# Patient Record
Sex: Male | Born: 1985 | Race: White | Hispanic: No | Marital: Single | Smoking: Former smoker
Health system: Southern US, Community
[De-identification: ages and names within clinical notes are randomized; demographics above are authoritative.]

## PROBLEM LIST (undated history)

## (undated) DIAGNOSIS — E785 Hyperlipidemia, unspecified: Secondary | ICD-10-CM

## (undated) DIAGNOSIS — I509 Heart failure, unspecified: Secondary | ICD-10-CM

## (undated) DIAGNOSIS — I255 Ischemic cardiomyopathy: Secondary | ICD-10-CM

## (undated) DIAGNOSIS — R0789 Other chest pain: Secondary | ICD-10-CM

## (undated) DIAGNOSIS — Z8659 Personal history of other mental and behavioral disorders: Secondary | ICD-10-CM

## (undated) DIAGNOSIS — T1491XA Suicide attempt, initial encounter: Secondary | ICD-10-CM

## (undated) DIAGNOSIS — I2510 Atherosclerotic heart disease of native coronary artery without angina pectoris: Secondary | ICD-10-CM

## (undated) DIAGNOSIS — Z87891 Personal history of nicotine dependence: Secondary | ICD-10-CM

## (undated) DIAGNOSIS — I4891 Unspecified atrial fibrillation: Secondary | ICD-10-CM

## (undated) DIAGNOSIS — G4700 Insomnia, unspecified: Secondary | ICD-10-CM

## (undated) DIAGNOSIS — I214 Non-ST elevation (NSTEMI) myocardial infarction: Secondary | ICD-10-CM

## (undated) HISTORY — PX: TYMPANOSTOMY TUBE PLACEMENT: SHX32

## (undated) HISTORY — DX: Hyperlipidemia, unspecified: E78.5

## (undated) HISTORY — PX: CORONARY STENT PLACEMENT: SHX1402

## (undated) HISTORY — PX: COLONOSCOPY: SHX174

## (undated) HISTORY — DX: Other chest pain: R07.89

## (undated) HISTORY — DX: Suicide attempt, initial encounter: T14.91XA

## (undated) HISTORY — DX: Atherosclerotic heart disease of native coronary artery without angina pectoris: I25.10

## (undated) HISTORY — DX: Personal history of nicotine dependence: Z87.891

## (undated) HISTORY — DX: Personal history of other mental and behavioral disorders: Z86.59

## (undated) HISTORY — DX: Insomnia, unspecified: G47.00

## (undated) HISTORY — DX: Heart failure, unspecified: I50.9

## (undated) HISTORY — DX: Non-ST elevation (NSTEMI) myocardial infarction: I21.4

## (undated) HISTORY — DX: Unspecified atrial fibrillation: I48.91

## (undated) HISTORY — DX: Ischemic cardiomyopathy: I25.5

## (undated) HISTORY — PX: ADENOIDECTOMY: SUR15

## (undated) HISTORY — PX: UPPER GASTROINTESTINAL ENDOSCOPY: SHX188

---
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
## 2007-08-01 DIAGNOSIS — I214 Non-ST elevation (NSTEMI) myocardial infarction: Secondary | ICD-10-CM

## 2007-08-01 HISTORY — DX: Non-ST elevation (NSTEMI) myocardial infarction: I21.4

## 2007-08-30 ENCOUNTER — Inpatient Hospital Stay (HOSPITAL_COMMUNITY): Admission: EM | Admit: 2007-08-30 | Discharge: 2007-09-01 | Payer: Self-pay | Admitting: Cardiovascular Disease

## 2007-08-30 ENCOUNTER — Ambulatory Visit: Payer: Self-pay | Admitting: Cardiovascular Disease

## 2007-08-30 ENCOUNTER — Ambulatory Visit: Payer: Self-pay | Admitting: Cardiology

## 2007-08-31 ENCOUNTER — Encounter: Payer: Self-pay | Admitting: Cardiology

## 2007-09-23 ENCOUNTER — Encounter: Payer: Self-pay | Admitting: Physician Assistant

## 2007-09-23 ENCOUNTER — Ambulatory Visit: Payer: Self-pay | Admitting: Cardiology

## 2007-09-26 ENCOUNTER — Encounter: Payer: Self-pay | Admitting: Physician Assistant

## 2007-12-27 ENCOUNTER — Ambulatory Visit: Payer: Self-pay | Admitting: Cardiology

## 2007-12-28 ENCOUNTER — Ambulatory Visit: Payer: Self-pay | Admitting: Cardiology

## 2009-02-26 ENCOUNTER — Encounter: Payer: Self-pay | Admitting: Cardiology

## 2010-07-15 NOTE — Assessment & Plan Note (Signed)
[facility]CARE                          [facility] OFF[facility] NOTE   NAME:Wyatt Donovan, Wyatt Donovan                     MRN:          [id number]  DATE:09/23/2007                            DOB:          Mar 10, 1985    CARDIOLOGIST:  Learta Codding, MD,FACC.   PRIMARY CARE PHYSICIAN:  None.   REASON FOR VISIT:  Post-hospitalization followup.   HISTORY OF PRESENT ILLNESS:  Wyatt Donovan is a 25 year old male patient  with essentially negative past medical history who presented to [facility] on August 30, 2007 with complaints of chest pain.  He was noted  to have biphasic T waves and some ST elevation in anterior precordial  leads.  His troponin went up to 1.92.  An echocardiogram revealed an EF  of 40-45% with anterior and anteroapical akinesis.  He was transferred  to [facility] for further evaluation and treatment.   The patient was taken emergently to the cardiac catheterization lab and  seen by Dr. Tonny Bollman.  The patient's heart catheterization  demonstrated 95% proximal lesion in the LAD.  This was treated with a  bare-metal stent.  His post PCI course was fairly uneventful.  He was  placed on beta-blocker, ACE inhibitor, statin therapy, aspirin, and  Plavix.  He returns to the office today for followup.   The patient notes 1 episode of chest pain several days ago while at  rest.  This was somewhat similar to his pain with myocardial infarction,  but nowhere near as intense.  He took a nitroglycerin and the pain  subsided in about 15-20 minutes.  He did note some associated shortness  of breath, nausea, and diaphoresis.  He has been walking as much as  possible.  It is up to 12 minutes twice a day.  He denies any exertional  chest discomfort or shortness of breath.  He denies orthopnea, PND, or  pedal edema.  He denies any syncope.  He denies any pleuritic chest  pain.  He is having great difficulty with sleeping.  He also feels quite  weak and tired.   MEDICATIONS:  1. Aspirin 325 mg daily.  2. Plavix 75 mg daily.  3. Pravastatin 40 mg nightly.  4. Coreg 3.125 mg b.i.d.  5. Lisinopril 2.5 mg daily.  6. Nitroglycerin p.r.n. chest pain.   ALLERGIES:  CODEINE causes nausea.   SOCIAL HISTORY:  He recently quit smoking.   PHYSICAL EXAMINATION:  GENERAL:  He is a well-nourished and well-  developed male.  VITAL SIGNS:  Blood pressure is 110/66, pulse 78, and weight 201.4  pounds.  HEENT:  Normal.  NECK:  Without JVD.  LYMPH:  No lymphadenopathy.  CARDIAC:  Normal S1 and S2.  Regular rate and rhythm.  No murmur.  No  rubs.  No gallops.  LUNGS:  Clear to auscultation bilaterally.  No wheezes.  No rhonchi.  No  rales.  ABDOMEN:  Soft and nontender with normoactive bowel sounds.  No  organomegaly.  EXTREMITIES:  Without edema.  NEUROLOGIC:  He is alert and oriented x3.  Cranial nerves II-XII grossly  intact.  Right femoral arteriotomy site without hematoma or bruit.   Electrocardiogram reveals sinus rhythm with a heart rate of 71, first-  degree AV block with the PR interval of 240 msec, normal axis,  interventricular conduction delay, and J-point elevation in V2 through  V6.   IMPRESSION:  1. Coronary artery disease.      a.     Status post non-ST-elevation myocardial infarction in June       2009 treated with a bare-metal stent to the proximal left anterior       descending.      b.     No significant disease found in the left main, circumflex or       right coronary artery at time of catheterization.  2. Ischemic cardiomyopathy with an ejection fraction of 40-45%.  3. Chest pain.  4. Insomnia.  5. Dyslipidemia.  6. History of depression.  7. Ex-smoker.   PLAN:  1. Mr. Slates returns to the office today for followup.  Overall, he      is stable from a cardiovascular standpoint.  He has had problems      with insomnia as well as a single episode of chest pain.  His EKG      is somewhat abnormal, but  it appears that this is likely all J-      point elevation.  He is not currently having any chest pain.  He      did have 1 episode of chest discomfort, but this resolved with      nitroglycerin.  According to the cardiac catheterization, he has no      evidence of other residual obstructive disease.  2. The patient's orthostatic vital signs were checked today.  His      blood pressure lying was 118/72 with pulse of 69, sitting 121/76      with a pulse of 75, and standing 122/81 with a pulse of 83.  After      2 minutes, 129/82 with a pulse of 79 and after 5 minutes 113/72      with a pulse of 78.  Therefore, the patient is not orthostatic.      His heart rate does increase a little bit when he stands.  I have      asked him to be certain he is drinking plenty of water to stay      hydrated.  3. No adjustments in his present medications will be made today.  4. We will add ranitidine 150 mg b.i.d to cover for gastrointestinal      symptoms causing some of his chest pain.  He is currently on      several medications that may be causing some dyspepsia.  5. I have prescribed Restoril 15 mg one-half to one tablet nightly      p.r.n. to better help him with sleep.  I suspect a great deal of      his symptoms are related to insomnia.  6. He will need followup lipids in a couple of months.  7. He will need to followup with an echocardiogram in the beginning of      October.  8. I discussed the patient's case with Dr. Diona Browner.  He agreed with      the above assessment and plan.  The patient knows to go to the      emergency room if he has recurrent symptoms that do not resolve      with nitroglycerin.  He will  follow up in 4 weeks or sooner p.r.n.      At some point, we may need to consider followup stress testing, if      he has recurrent symptoms.       Tereso Newcomer, PA-C  Electronically Signed      Jonelle Sidle, MD  Electronically Signed   SW/MedQ  DD: 09/23/2007  DT:  09/24/2007  Job #: [id number]

## 2010-07-15 NOTE — Discharge Summary (Signed)
Wyatt Donovan, Wyatt Donovan              ACCOUNT NO.:  [id number]   MEDICAL RECORD NO.:  [id number]          PATIENT TYPE:  OIB   LOCATION:  4736                         FACILITY:  [facility]   PHYSICIAN:  Veverly Fells. Excell Seltzer, MD  DATE OF BIRTH:  February 10, 1986   DATE OF ADMISSION:  08/30/2007  DATE OF DISCHARGE:  08/31/2007                               DISCHARGE SUMMARY   PRIMARY CARDIOLOGIST:  Dr. Lewayne Bunting.   DISCHARGE DIAGNOSIS:  Acute non-ST segment elevation myocardial  infarction.   SECONDARY DIAGNOSES:  1. Coronary artery disease status post successful PCI stenting of the      proximal left anterior descending with placement of a 3.5 x 18 mm      Vision  bare metal stent.  2. Hyperlipidemia.  3. Tobacco abuse.  4. Ischemic cardiopathy EF 40-45% by 2-D echocardiogram performed at      [facility].  Apical akinesis noted on left ventriculography.  5. History of depression with previous suicide attempt in December      2002.   ALLERGIES:  CODEINE.   PROCEDURES:  Left cardiac catheterization with successful PCI and  stenting of the proximal LAD as outlined above.   HISTORY OF PRESENT ILLNESS:  A 25 year old Caucasian male without prior  cardiac history.  He does have a family history of CAD with his mother  experiencing CAD and PVD starting at age 69.  He was in his usual state  of health until approximately 3 days prior to admission when began to  experience what he felt was indigestion that was occurring after meals.  On the morning of August 30, 2007, he awoke with more stabbing-like pain  in the center of his chest, and his father took him to the [facility] emergency room.  There, he was noted to have elevated troponin  which was initially 1.3 with a CK 271 and MB of 12.2.  He subsequently  bumped further and cardiology was consulted.  His ECG also showed  anterior J-point elevation with coving of the ST-segment.  2-D  echocardiogram was performed at [facility]  showing an EF of 40-  45% with multiple regional wall motion abnormalities.  The decision was  made to transfer to [facility] for further evaluation and management of  non-ST segment elevation MI.   HOSPITAL COURSE:  Upon arrival, the patient was experiencing chest  discomfort and was taken to the cath lab urgently.  Left heart cardiac  catheterization was performed revealing a 95% stenosis at the proximal  LAD and otherwise nonobstructive disease.  EF was 45% with apical  akinesis.  The LAD was successfully stented with a 3.5 x 18 mm Vision  bare metal stent.  The patient tolerated this procedure well and  postprocedure, he peaked his CK at 472, MB of 35.6, troponin I at 6.04.  He has been initiated on aspirin, Plavix, Statin, beta blocker, ACE  inhibitor therapy and has not had any recurrent chest discomfort.  He  has been seen by the cardiac rehab team and has also been counseled  extensively on the importance of smoking cessation  as well as medication  compliance.  As he does have some financial hardship, we have tried to  keep his medications generic wherever possible and I have also  arrangements for him to receive a 14-day Plavix card, as well as  enrollment in the [employer] Plavix assistance plan.  Wyatt Donovan will follow up with Dr. Andee Lineman in approximately 3 weeks at  which point he will require a basic metabolic panel.  He will require  lipids and LFTs in approximately 8 weeks and repeat echo in about 3  months.  Wyatt Donovan is being discharged home today in good condition.   DISCHARGE LABS:  Hemoglobin 15.9, hematocrit 45.5, WBC 9.3, platelets  184,000.  Sodium 44, potassium 4.6, chloride 105, CO2 32, BUN 5,  creatinine 0.92, glucose 85, total bilirubin 0.9, alkaline phosphatase  53, AST 57, ALT 37, total protein 6.4, albumin 3.5, calcium 9, CK 472,  MB 35.6, troponin I 6.04, total cholesterol 178, triglycerides 255, HDL  220, LDL 107.  TSH 1.939.  Homocysteine  10.   DISPOSITION:  The patient is being discharged home today in good  condition.   FOLLOWUP PLANS AND APPOINTMENTS:  We have arranged for follow up with  Dr. Andee Lineman on July 24 at 1:45 p.m.  As we are initiating ACE inhibitor  at this point, he should have repeat BMET.  Follow up with lipids and  LFTs in 8 weeks and an echo in 3 months   DISCHARGE MEDICATIONS:  1. Aspirin 325 mg daily.  2. Plavix 75 mg daily.  3. Pravastatin 40 mg nightly.  4. Coreg 3.125 mg b.i.d.  5. Lisinopril 2.5 mg daily.  6. Nitroglycerin 0.4 mg sublingual p.r.n. chest pain.   OUTSTANDING LABS AND STUDIES:  None.   DURATION OF DISCHARGE ENCOUNTER:  60 minutes including physician time.      Nicolasa Ducking, ANP      Veverly Fells. Excell Seltzer, MD  Electronically Signed    CB/MEDQ  D:  09/01/2007  T:  09/01/2007  Job:  [id number]   cc:   Learta Codding, MD,FACC

## 2010-07-15 NOTE — Assessment & Plan Note (Signed)
[facility]CARE                          [facility] NOTE   NAME:Wyatt Donovan, Wyatt Donovan                     MRN:          [id number]  DATE:12/28/2007                            DOB:          [birth year]    HISTORY OF PRESENT ILLNESS:  The patient is a pleasant 25 year old male  with a recent non-ST-elevation myocardial infarction in June 2009,  treated with a bare-metal stent to the proximal LAD.  Ejection fraction  at that time was 40-45%.  The patient had a followup echocardiographic  study done on August 30, 2007, with an ejection fraction of 40-45%.  His  most recent echocardiogram, however, was dated December 27, 2007, and  demonstrates normal left ventricular function, mild hypertrophy, and  ejection fraction of 55-60%.  The patient is doing well.  He reports no  shortness of breath.  He has occasional atypical chest pains, but they  are in the setting of what appears to be fairly panic attacks and a  generalized anxiety disorder.  The patient stated he wakes up sometimes  in the middle of the night rather clammy, sweaty, and ruminating about  his heart attack.  He is always afraid that this might reoccur.  He does  not show any signs of depression, although he has generalized difficulty  with sleeping.  Dr. Tereso Newcomer had prescribed Ambien, but the patient  states that this did not help make him sleep.   PHYSICAL EXAMINATION:  VITAL SIGNS:  Blood pressure 102/74, heart rate  66, weighs 197 pounds.  NECK:  Normal carotid upstroke and no carotid bruits.  LUNGS:  Clear breath sounds bilaterally.  HEART:  Regular rate and rhythm, normal S1 and S2.  No pathological  murmurs.  ABDOMEN:  Soft, nontender, and no rebound.  EXTREMITIES:  No cyanosis, clubbing, or edema.   PROBLEM LIST:  1. Coronary artery disease status post non-ST-elevation myocardial      infarction in June 2009, treated with a bare-metal stent to the      proximal left anterior  descending.  2. Ischemic cardiomyopathy with improved ejection fraction to 55-60%.  3. Atypical chest pain.  4. Insomnia.  5. History of depression with previous suicide attempt in December      2002.  6. Ex-smoker.  7. Dyslipidemia.  8. Generalized anxiety state.   PLAN:  1. The patient is doing well from a cardiovascular perspective.  He      has used nitroglycerin on one occasion but did appear that was in      the setting of anxiety when he had atypical chest pain.  He really      reports no angina.  2. The patient does report some dizziness after he takes lisinopril      and we have discontinued this medication particularly in light of      his normal left ventricular function.  3. The most important thing for this patient to take care of his is      generalized anxiety state, which is associated with insomnia,      ruminating thoughts, and early panic attacks.  He states that he      took Zoloft in the past but did not really like to have nausea      associated with it.  We talked about the possibility of giving him      citalopram but eventually decided to stay away from the SSRI drugs      and try him on buspirone at 7.5 mg p.o. b.i.d. with up-titration of      the dose as needed.  I also gave him clonazepam 0.5 mg p.o. b.i.d.      to help in the first 4 weeks with his generalized anxiety state and      this can then be tapered off at week 4-6 and leave him on buspirone      alone.  It may still well be that he may need a small dose of      clonazepam longer for his insomnia.     Learta Codding, MD,FACC  Electronically Signed    GED/MedQ  DD: 12/28/2007  DT: 12/28/2007  Job #: [id number]

## 2010-07-18 NOTE — H&P (Signed)
[facility]  Patient:    Wyatt Donovan, Wyatt Donovan Visit Number: [id number] MRN: [id number]          Service Type: PSY Location: 200 0201 01 Attending Physician:  Veneta Penton. Dictated by:   Veneta Penton, M.D. Admit Date:  02/17/2001                     Psychiatric Admission Assessment  REASON FOR ADMISSION:  This 25 year old white male was admitted complaining of depression status post overdose as a suicide attempt.  HISTORY OF PRESENT ILLNESS:  The patient complains of increasingly depressed, irritable and angry mood most of the day, nearly every day, with increasing anxiety, anhedonia, decreased school performance, decreased concentration and energy level, increased symptoms of fatigue, insomnia, weight gain, psychomotor agitation, feelings of hopelessness, helplessness, worthlessness. He is unable to contract for safety at this time.  He reports several psychosocial stressors.  His father has lost his job over the past several months because of worsening of the fathers emphysema.  The fathers doctor has told him the father could not return to work.  This has caused significant financial stressors in the home.  Added to this has been the fact that mother was injured on the job and no longer has use of her hand and is presently applying for [employer].  PAST PSYCHIATRIC HISTORY:  History of attention-deficit hyperactivity disorder.  He denies any other history of psychiatric illness.  ALCOHOL/DRUG HISTORY:  Uses cannabis on a daily basis for the past several years.  He states that "Im trying to quit" and that he had stopped using all cannabis four days ago.  He reports smoking one pack of cigarettes per day for the past several years.  He denies any other street drug use.  ALLERGIES:  He has no known drug allergies or sensitivities.  PAST MEDICAL HISTORY:  Obesity.  He denies any other medical or surgical problems.  CURRENT  MEDICATIONS:  Ritalin 5 mg p.o. b.i.d., Ambien 10 mg p.o. q.h.s., which are prescribed by Dr. Wynonia Lawman, his outpatient psychiatrist.  He reports that he has had no help in the past from trials of Paxil CR and Zoloft and that both of these drugs caused weight gain.  STRENGTHS AND ASSETS:  His parents are very supportive of him.  FAMILY/SOCIAL HISTORY:  The patient lives with his mother and father.  He is currently in the 11th grade and will be promoted to 12th grade with the next semester.  MENTAL STATUS EXAMINATION:  The patient presents as a disheveled, unkempt, well-developed, well-nourished, obese adolescent white male, who is alert, oriented x 4.  Cooperative with the evaluation and appearance is compatible with his stated age.  Speech is coherent with a decreased rate and volume of speech, increased speech latency.  He displays no looseness of associations, phonemic errors or evidence of a thought disorder.  His affect and mood are depressed and irritable and anxious.  His concentration is decreased as is his attention span.  He is easily distracted by extraneous stimuli.  His immediate recall, short-term memory and remote memory are intact.  Similarities and differences are within normal limits and he is able to abstract to simple proverbs.  His thought processes are generally goal directed.  DIAGNOSES:  (According to DSM-IV). Axis I:    1. Major depression, single episode, severe without psychosis.            2. Attention-deficit hyperactivity disorder, combined-type.  3. Cannabis dependence.            4. Nicotine dependence. Axis II:   1. Rule out personality disorder not otherwise specified.            2. Rule out learning disorder not otherwise specified. Axis III:  Obesity. Axis IV:   Severe. Axis V:    20.  ESTIMATED LENGTH OF STAY:  Five to seven days.  INITIAL DISCHARGE PLAN:  Discharge the patient to home.  INITIAL PLAN OF CARE:  Begin the patient on a  trial of Effexor XR once informed consent is obtained and the risks/benefits discussion has been held. Psychotherapy will focus on improving the patients impulse control, decreasing cognitive distortions, decreasing potential for harm to self and others.  A laboratory workup will also be initiated to rule out any other medical problems contributing to his symptomatology.Dictated by:   Veneta Penton, M.D. Attending Physician:  Veneta Penton DD:  02/18/01 TD:  02/20/01 Job: [id number]

## 2010-07-18 NOTE — Discharge Summary (Signed)
[facility]  Patient:    Wyatt Donovan, Wyatt Donovan Visit Number: [id number] MRN: [id number]          Service Type: PSY Location: 200 0201 01 Attending Physician:  Veneta Penton. Dictated by:   Carolanne Grumbling, M.D. Admit Date:  02/17/2001 Discharge Date: 02/22/2001                             Discharge Summary  AGE/SEX:  The patient is a 25 year old male.  INITIAL ASSESSMENT AND DIAGNOSIS:  The patient was admitted to the service of Dr. Haynes Hoehn and I was on call at the time of discharge.  He was admitted after complaints of depression and having taken an overdose of six Xanax tablets in an apparent suicidal attempt.  He admitted to most of the characteristics of depression, including irritability, angry mood, loss of interest, poor school performance, trouble with concentration, symptoms of fatigue, sleeplessness, weight gain, hopelessness, helplessness, wrecklessness.  At the time of admission he was able to contract for safety. His stressors were that his father had lost his job because of emphysema and was told that he was not going to be able to return to work, this has caused financial stressors at home, his mother has been injured on her job and was no longer able to work and was applying for [employer].  He had a history of attention-deficit disorder.  Other pertinent history can be obtained from the psychosocial service summary.  PHYSICAL EXAMINATION:  Physical examination was within normal limits except for being overweight.  ADMITTING DIAGNOSES: Axis I:     1. Major depression, single episode, severe, without psychosis.             2. Attention-deficit hyperactivity disorder combined.             3. Cannabis dependence.             4. Nicotine dependence. Axis II:    Rule out personality disorder and learning disorder. Axis III:   Overweight. Axis IV:    Severe. Axis V:     20.  FINDINGS:  All indicated laboratory  examinations were within normal limits or noncontributory.  HOSPITAL COURSE:  While in the hospital, the patient was essentially no behavioral problem, he from the day he came in admitted that he was wrong in doing what he had done, he talked fairly openly about his relationship with his father and his mother, he was particularly his fathers loss of a job and his fathers emphysema, he and his dad had always been close.  Nevertheless, he said he understood that his job was to do what he is supposed to do and not add further burdens to the family, he said there is no reason why he could not continue loving his father and accepting him as he was because he knew his father had to do the same for himself.  After a family session with his parents, which went well, he was discharged based on the fact that he had consistently denied any suicidal thoughts and made no threats while he was in the hospital.  DIAGNOSES AT THE TIME OF DISCHARGE: Axis I:     1. Depressive disorder, not otherwise specified.             2. Attention-deficit hyperactivity disorder combined.             3. Cannabis abuse.  4. Nicotine dependence. Axis II:    Deferred. Axis III:   Overweight. Axis IV:    Severe. Axis V:     55.  POSTHOSPITAL CARE PLAN:  He is referred to outpatient treatment with Dr. Milford Cage at [facility] in [place] and the appointment will be made on Friday after Christmas as he was discharged on Christmas Eve.  At the time of discharge he was taking Concerta 36 mg daily, Effexor XR 75 mg daily, clonidine 0.1 mg at bedtime.  There were no restrictions placed on his activity or his diet. Dictated by:   Carolanne Grumbling, M.D. Attending Physician:  Veneta Penton DD:  03/08/01 TD:  03/08/01 Job: [id number]

## 2010-08-28 ENCOUNTER — Encounter: Payer: Self-pay | Admitting: Cardiology

## 2010-11-27 LAB — CBC
HCT: 45.5
Hemoglobin: 15.9
MCV: 93.3
Platelets: 184
RDW: 13.6

## 2010-11-27 LAB — BASIC METABOLIC PANEL
BUN: 5 — ABNORMAL LOW
CO2: 32
Chloride: 105
GFR calc non Af Amer: >60
Glucose, Bld: 85
Potassium: 4.6
Sodium: 144

## 2010-11-27 LAB — TROPONIN I: Troponin I: 6.04

## 2010-11-27 LAB — HEPATIC FUNCTION PANEL
AST: 57 — ABNORMAL HIGH
Bilirubin, Direct: 0.1
Indirect Bilirubin: 0.8

## 2010-11-27 LAB — LIPID PANEL
Triglycerides: 255 — ABNORMAL HIGH
VLDL: 51 — ABNORMAL HIGH

## 2010-11-27 LAB — TSH: TSH: 1.939 (ref 0.350–4.500)

## 2010-11-27 LAB — CK TOTAL AND CKMB (NOT AT ARMC): Relative Index: 7.5 — ABNORMAL HIGH

## 2010-11-27 LAB — HOMOCYSTEINE: Homocysteine: 10

## 2020-04-10 ENCOUNTER — Encounter: Payer: Self-pay | Admitting: Internal Medicine

## 2020-05-09 ENCOUNTER — Ambulatory Visit: Payer: Self-pay | Admitting: Internal Medicine

## 2020-06-29 ENCOUNTER — Emergency Department (HOSPITAL_COMMUNITY): Payer: Medicaid Other

## 2020-06-29 ENCOUNTER — Inpatient Hospital Stay (HOSPITAL_COMMUNITY): Payer: Medicaid Other

## 2020-06-29 ENCOUNTER — Encounter (HOSPITAL_COMMUNITY): Payer: Self-pay | Admitting: Internal Medicine

## 2020-06-29 ENCOUNTER — Inpatient Hospital Stay (HOSPITAL_COMMUNITY)
Admission: EM | Admit: 2020-06-29 | Discharge: 2020-07-10 | DRG: 233 | Disposition: A | Discharge status: Home / Self Care | Payer: Medicaid Other | Attending: Thoracic Surgery (Cardiothoracic Vascular Surgery) | Admitting: Thoracic Surgery (Cardiothoracic Vascular Surgery)

## 2020-06-29 ENCOUNTER — Other Ambulatory Visit: Payer: Self-pay

## 2020-06-29 DIAGNOSIS — Z8679 Personal history of other diseases of the circulatory system: Secondary | ICD-10-CM

## 2020-06-29 DIAGNOSIS — I214 Non-ST elevation (NSTEMI) myocardial infarction: Secondary | ICD-10-CM

## 2020-06-29 DIAGNOSIS — Z955 Presence of coronary angioplasty implant and graft: Secondary | ICD-10-CM

## 2020-06-29 DIAGNOSIS — R079 Chest pain, unspecified: Secondary | ICD-10-CM | POA: Diagnosis present

## 2020-06-29 DIAGNOSIS — D62 Acute posthemorrhagic anemia: Secondary | ICD-10-CM | POA: Diagnosis not present

## 2020-06-29 DIAGNOSIS — I2510 Atherosclerotic heart disease of native coronary artery without angina pectoris: Secondary | ICD-10-CM | POA: Diagnosis present

## 2020-06-29 DIAGNOSIS — K921 Melena: Secondary | ICD-10-CM | POA: Diagnosis not present

## 2020-06-29 DIAGNOSIS — Z79899 Other long term (current) drug therapy: Secondary | ICD-10-CM | POA: Diagnosis not present

## 2020-06-29 DIAGNOSIS — J90 Pleural effusion, not elsewhere classified: Secondary | ICD-10-CM

## 2020-06-29 DIAGNOSIS — I2111 ST elevation (STEMI) myocardial infarction involving right coronary artery: Principal | ICD-10-CM | POA: Diagnosis present

## 2020-06-29 DIAGNOSIS — Z7902 Long term (current) use of antithrombotics/antiplatelets: Secondary | ICD-10-CM

## 2020-06-29 DIAGNOSIS — F17210 Nicotine dependence, cigarettes, uncomplicated: Secondary | ICD-10-CM | POA: Diagnosis present

## 2020-06-29 DIAGNOSIS — I25110 Atherosclerotic heart disease of native coronary artery with unstable angina pectoris: Secondary | ICD-10-CM | POA: Diagnosis present

## 2020-06-29 DIAGNOSIS — I255 Ischemic cardiomyopathy: Secondary | ICD-10-CM | POA: Diagnosis present

## 2020-06-29 DIAGNOSIS — I5043 Acute on chronic combined systolic (congestive) and diastolic (congestive) heart failure: Secondary | ICD-10-CM | POA: Diagnosis not present

## 2020-06-29 DIAGNOSIS — I252 Old myocardial infarction: Secondary | ICD-10-CM

## 2020-06-29 DIAGNOSIS — I513 Intracardiac thrombosis, not elsewhere classified: Secondary | ICD-10-CM | POA: Diagnosis present

## 2020-06-29 DIAGNOSIS — I509 Heart failure, unspecified: Secondary | ICD-10-CM

## 2020-06-29 DIAGNOSIS — J9811 Atelectasis: Secondary | ICD-10-CM | POA: Diagnosis not present

## 2020-06-29 DIAGNOSIS — Z8249 Family history of ischemic heart disease and other diseases of the circulatory system: Secondary | ICD-10-CM

## 2020-06-29 DIAGNOSIS — E785 Hyperlipidemia, unspecified: Secondary | ICD-10-CM | POA: Diagnosis present

## 2020-06-29 DIAGNOSIS — Z885 Allergy status to narcotic agent status: Secondary | ICD-10-CM | POA: Diagnosis not present

## 2020-06-29 DIAGNOSIS — Z20822 Contact with and (suspected) exposure to covid-19: Secondary | ICD-10-CM | POA: Diagnosis present

## 2020-06-29 DIAGNOSIS — J939 Pneumothorax, unspecified: Secondary | ICD-10-CM

## 2020-06-29 DIAGNOSIS — Z951 Presence of aortocoronary bypass graft: Secondary | ICD-10-CM

## 2020-06-29 LAB — CBC WITH DIFFERENTIAL/PLATELET
Abs Immature Granulocytes: 0.04 10*3/uL (ref 0.00–0.07)
Basophils Absolute: 0.1 10*3/uL (ref 0.0–0.1)
Basophils Relative: 1 %
Eosinophils Absolute: 0.4 10*3/uL (ref 0.0–0.5)
Eosinophils Relative: 3 %
HCT: 53.5 % — ABNORMAL HIGH (ref 39.0–52.0)
Hemoglobin: 17.1 g/dL — ABNORMAL HIGH (ref 13.0–17.0)
Immature Granulocytes: 0 %
Lymphocytes Relative: 34 %
Lymphs Abs: 3.9 10*3/uL (ref 0.7–4.0)
MCH: 31 pg (ref 26.0–34.0)
MCHC: 32 g/dL (ref 30.0–36.0)
MCV: 96.9 fL (ref 80.0–100.0)
Monocytes Absolute: 0.8 10*3/uL (ref 0.1–1.0)
Monocytes Relative: 7 %
Neutro Abs: 6.2 10*3/uL (ref 1.7–7.7)
Neutrophils Relative %: 55 %
Platelets: 207 10*3/uL (ref 150–400)
RBC: 5.52 MIL/uL (ref 4.22–5.81)
RDW: 13.4 % (ref 11.5–15.5)
WBC: 11.4 10*3/uL — ABNORMAL HIGH (ref 4.0–10.5)
nRBC: 0 % (ref 0.0–0.2)

## 2020-06-29 LAB — COMPREHENSIVE METABOLIC PANEL
ALT: 44 U/L (ref 0–44)
AST: 36 U/L (ref 15–41)
Albumin: 3.6 g/dL (ref 3.5–5.0)
Alkaline Phosphatase: 69 U/L (ref 38–126)
Anion gap: 11 (ref 5–15)
BUN: 15 mg/dL (ref 6–20)
CO2: 24 mmol/L (ref 22–32)
Calcium: 9.1 mg/dL (ref 8.9–10.3)
Chloride: 109 mmol/L (ref 98–111)
Creatinine, Ser: 0.96 mg/dL (ref 0.61–1.24)
GFR, Estimated: >60 mL/min (ref >60)
Glucose, Bld: 103 mg/dL — ABNORMAL HIGH (ref 70–99)
Potassium: 4 mmol/L (ref 3.5–5.1)
Sodium: 144 mmol/L (ref 135–145)
Total Bilirubin: 0.7 mg/dL (ref 0.3–1.2)
Total Protein: 6.4 g/dL — ABNORMAL LOW (ref 6.5–8.1)

## 2020-06-29 LAB — TROPONIN I (HIGH SENSITIVITY)
Troponin I (High Sensitivity): 74 ng/L — ABNORMAL HIGH (ref <18)
Troponin I (High Sensitivity): 366 ng/L (ref <18)
Troponin I (High Sensitivity): 25186 ng/L (ref <18)

## 2020-06-29 LAB — RESP PANEL BY RT-PCR (FLU A&B, COVID) ARPGX2
Influenza A by PCR: NEGATIVE
Influenza B by PCR: NEGATIVE
SARS Coronavirus 2 by RT PCR: NEGATIVE

## 2020-06-29 LAB — HEPARIN LEVEL (UNFRACTIONATED)
Heparin Unfractionated: 0.32 IU/mL (ref 0.30–0.70)
Heparin Unfractionated: 0.37 IU/mL (ref 0.30–0.70)

## 2020-06-29 LAB — PROTIME-INR
INR: 1 (ref 0.8–1.2)
Prothrombin Time: 13.3 seconds (ref 11.4–15.2)

## 2020-06-29 LAB — BRAIN NATRIURETIC PEPTIDE: B Natriuretic Peptide: 129.9 pg/mL — ABNORMAL HIGH (ref 0.0–100.0)

## 2020-06-29 LAB — LIPID PANEL
Cholesterol: 125 mg/dL (ref 0–200)
HDL: 27 mg/dL — ABNORMAL LOW (ref >40)
LDL Cholesterol: 83 mg/dL (ref 0–99)
Total CHOL/HDL Ratio: 4.6 RATIO
Triglycerides: 75 mg/dL (ref <150)
VLDL: 15 mg/dL (ref 0–40)

## 2020-06-29 LAB — T4, FREE: Free T4: 1.18 ng/dL — ABNORMAL HIGH (ref 0.61–1.12)

## 2020-06-29 LAB — MRSA PCR SCREENING: MRSA by PCR: NEGATIVE

## 2020-06-29 LAB — TSH: TSH: 0.978 u[IU]/mL (ref 0.350–4.500)

## 2020-06-29 LAB — HIV ANTIBODY (ROUTINE TESTING W REFLEX): HIV Screen 4th Generation wRfx: NONREACTIVE

## 2020-06-29 IMAGING — DX DG CHEST 1V PORT
2 series · 2 of 2 positions shown · non-contrast
Comparison: Prior radiograph from [DATE].

CLINICAL DATA: Initial evaluation for acute chest pain.

EXAM:
PORTABLE CHEST 1 VIEW

[chest ap (1 of 2)]
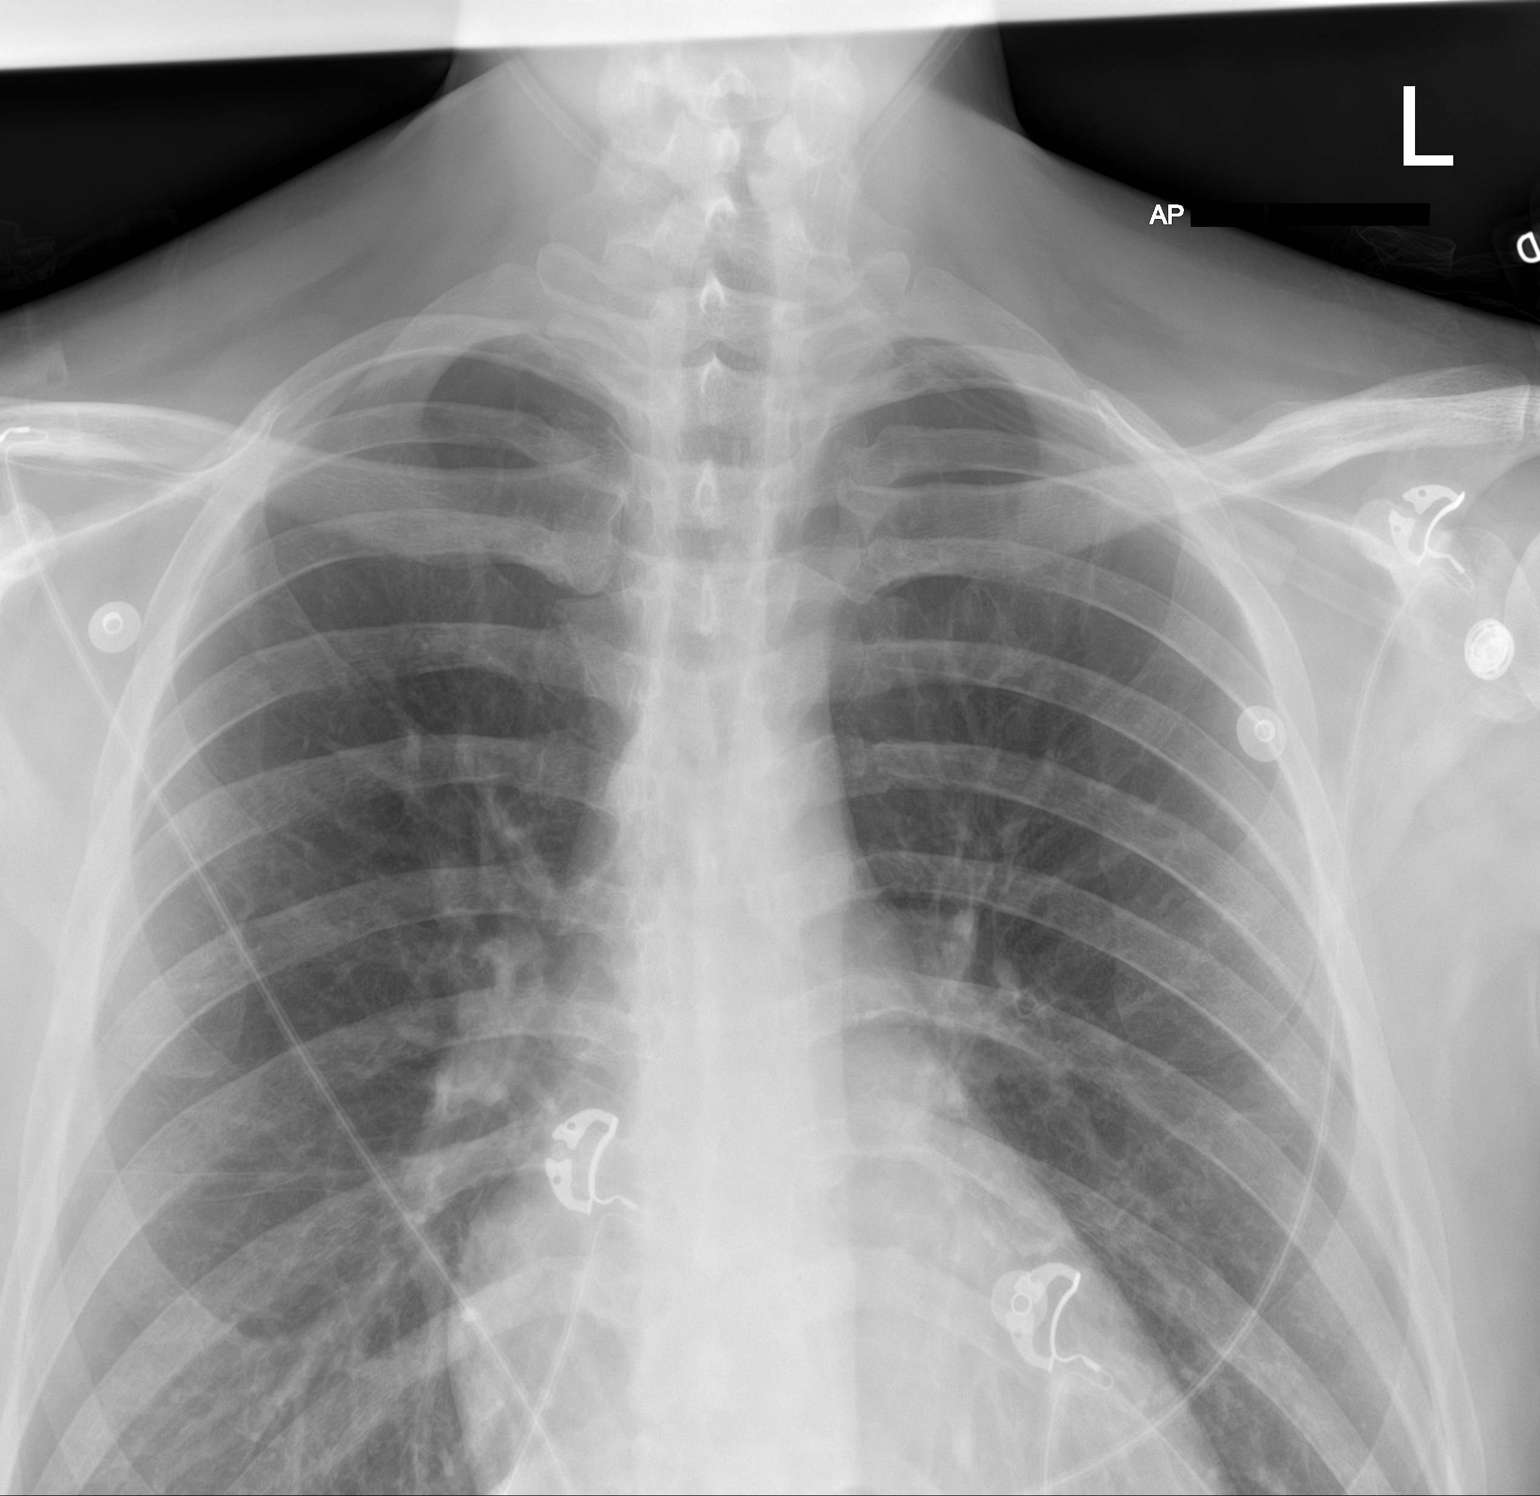

[chest ap (2 of 2)]
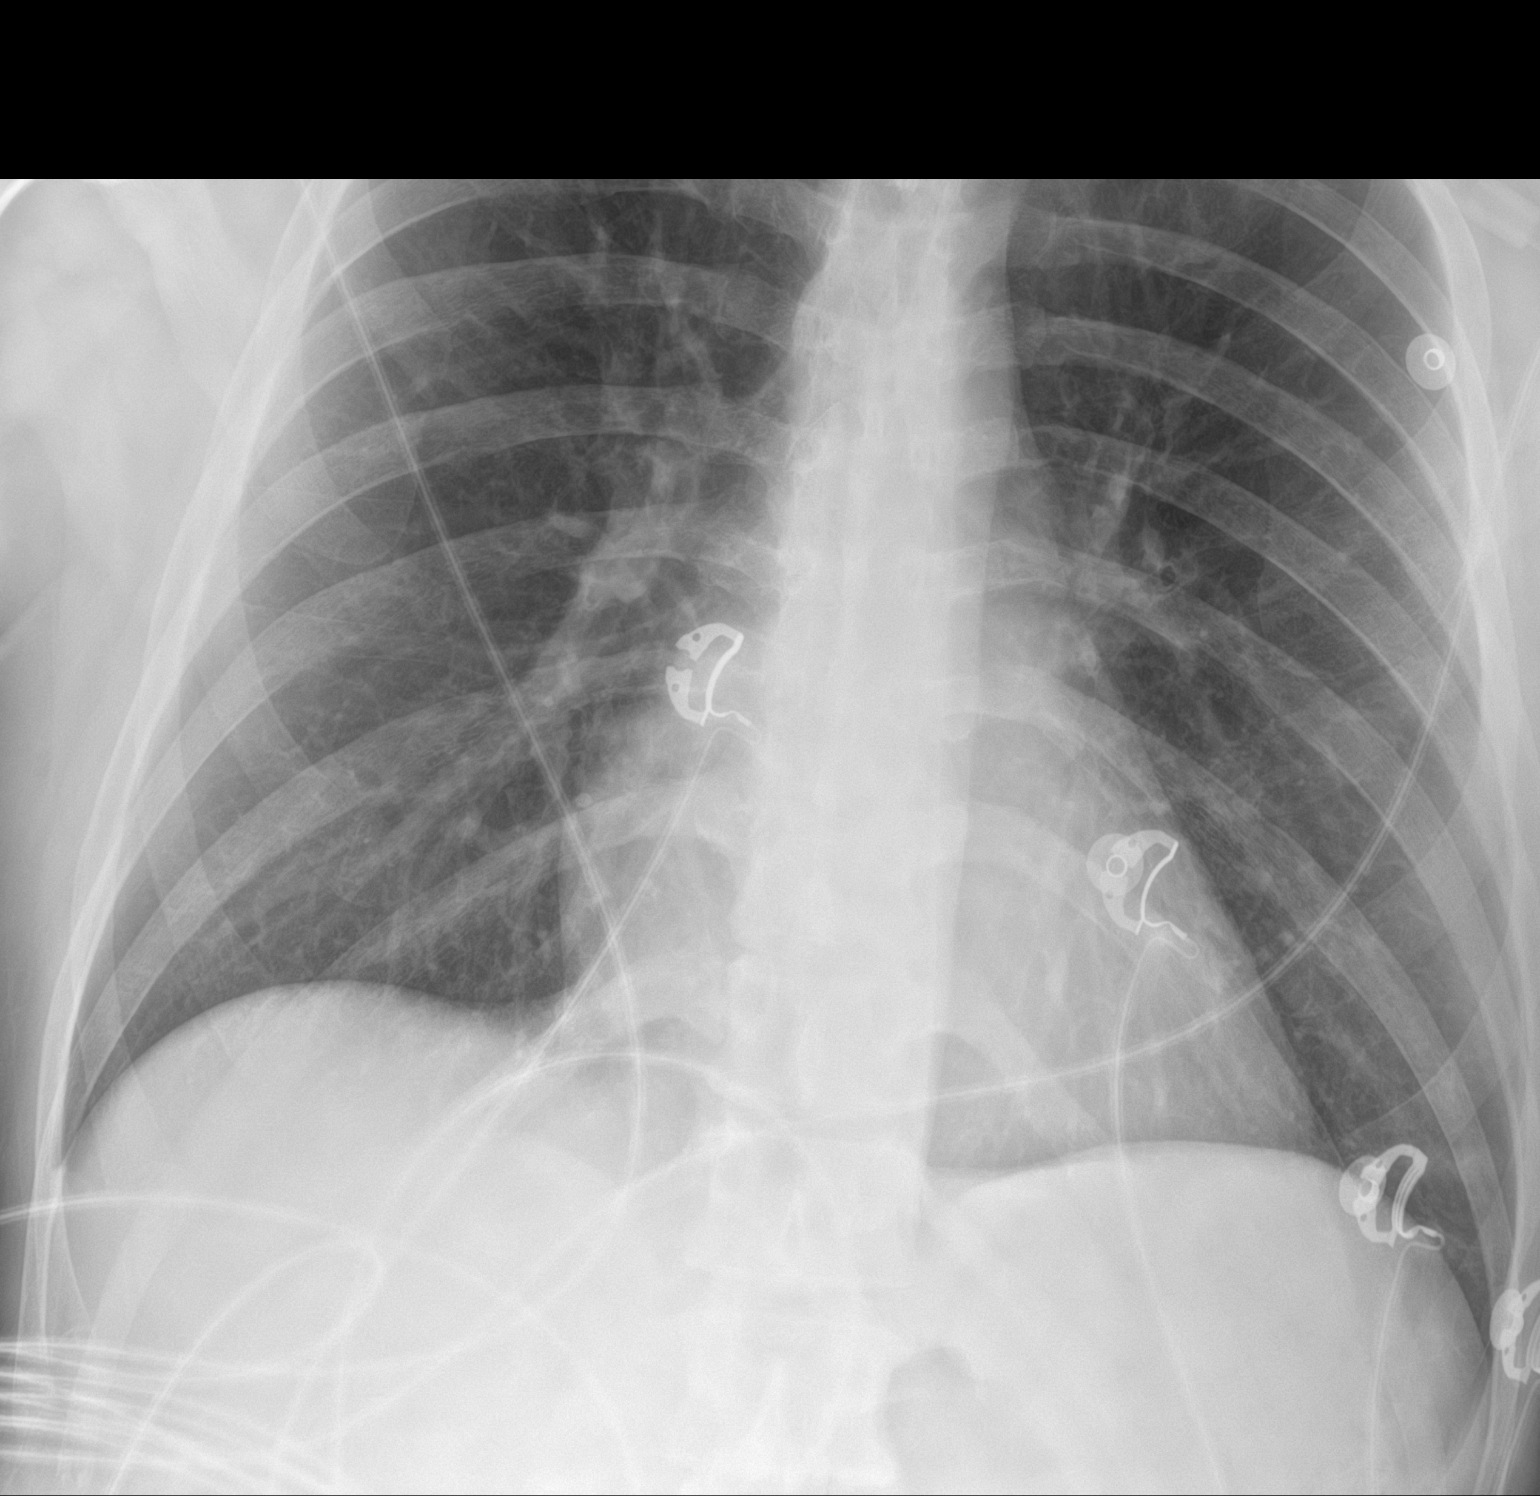

[2 of 2 positions shown; findings below may reference images not displayed]

FINDINGS: The cardiac and mediastinal silhouettes are stable in size and
contour, and remain within normal limits.

The lungs are normally inflated. No airspace consolidation, pleural
effusion, or pulmonary edema. No pneumothorax.

No acute osseous abnormality.
IMPRESSION: No active cardiopulmonary disease.

## 2020-06-29 MED ORDER — HEPARIN BOLUS VIA INFUSION
4000.0000 [IU] | Freq: Once | INTRAVENOUS | Status: AC
  Administered 2020-06-29: 4000 [IU] via INTRAVENOUS
  Filled 2020-06-29: qty 4000

## 2020-06-29 MED ORDER — ATORVASTATIN CALCIUM 40 MG PO TABS
40.0000 mg | ORAL_TABLET | Freq: Every day | ORAL | Status: DC
  Administered 2020-06-29 – 2020-07-10 (×11): 40 mg via ORAL
  Filled 2020-06-29 (×11): qty 1

## 2020-06-29 MED ORDER — NITROGLYCERIN IN D5W 200-5 MCG/ML-% IV SOLN
0.0000 ug/min | INTRAVENOUS | Status: DC
  Administered 2020-06-29: 5 ug/min via INTRAVENOUS
  Administered 2020-06-30: 50 ug/min via INTRAVENOUS
  Administered 2020-07-01: 40 ug/min via INTRAVENOUS
  Filled 2020-06-29 (×3): qty 250

## 2020-06-29 MED ORDER — ASPIRIN 81 MG PO CHEW
324.0000 mg | CHEWABLE_TABLET | ORAL | Status: AC
  Administered 2020-06-29: 324 mg via ORAL
  Filled 2020-06-29: qty 4

## 2020-06-29 MED ORDER — HEPARIN (PORCINE) 25000 UT/250ML-% IV SOLN
1300.0000 [IU]/h | INTRAVENOUS | Status: DC
  Administered 2020-06-29 – 2020-07-01 (×3): 1100 [IU]/h via INTRAVENOUS
  Filled 2020-06-29 (×4): qty 250

## 2020-06-29 MED ORDER — ACETAMINOPHEN 325 MG PO TABS: 650.0000 mg | ORAL_TABLET | ORAL | Status: DC | PRN

## 2020-06-29 MED ORDER — METOPROLOL SUCCINATE ER 25 MG PO TB24
25.0000 mg | ORAL_TABLET | Freq: Every day | ORAL | Status: DC
  Administered 2020-06-29: 25 mg via ORAL
  Filled 2020-06-29: qty 1

## 2020-06-29 MED ORDER — HYDROCODONE-ACETAMINOPHEN 5-325 MG PO TABS
1.0000 | ORAL_TABLET | ORAL | Status: DC | PRN
  Administered 2020-06-29 (×2): 2 via ORAL
  Administered 2020-06-29: 1 via ORAL
  Administered 2020-06-30 – 2020-07-01 (×5): 2 via ORAL
  Administered 2020-07-01: 1 via ORAL
  Administered 2020-07-02 (×2): 2 via ORAL
  Administered 2020-07-02: 1 via ORAL
  Filled 2020-06-29 (×3): qty 2
  Filled 2020-06-29 (×2): qty 1
  Filled 2020-06-29: qty 2
  Filled 2020-06-29: qty 1
  Filled 2020-06-29 (×5): qty 2

## 2020-06-29 MED ORDER — HYDROMORPHONE HCL 1 MG/ML IJ SOLN
1.0000 mg | INTRAMUSCULAR | Status: DC | PRN
  Administered 2020-06-29 – 2020-07-02 (×4): 1 mg via INTRAVENOUS
  Filled 2020-06-29 (×4): qty 1

## 2020-06-29 MED ORDER — ASPIRIN 81 MG PO CHEW
81.0000 mg | CHEWABLE_TABLET | Freq: Every day | ORAL | Status: DC
  Administered 2020-06-30 – 2020-07-02 (×2): 81 mg via ORAL
  Filled 2020-06-29 (×2): qty 1

## 2020-06-29 MED ORDER — ONDANSETRON HCL 4 MG/2ML IJ SOLN: 4.0000 mg | Freq: Four times a day (QID) | INTRAMUSCULAR | Status: DC | PRN

## 2020-06-29 MED ORDER — MORPHINE SULFATE (PF) 4 MG/ML IV SOLN
4.0000 mg | Freq: Once | INTRAVENOUS | Status: AC
  Administered 2020-06-29: 4 mg via INTRAVENOUS
  Filled 2020-06-29: qty 1

## 2020-06-29 MED ORDER — ASPIRIN EC 81 MG PO TBEC: 81.0000 mg | DELAYED_RELEASE_TABLET | Freq: Every day | ORAL | Status: DC

## 2020-06-29 MED ORDER — NITROGLYCERIN 0.4 MG SL SUBL: 0.4000 mg | SUBLINGUAL_TABLET | SUBLINGUAL | Status: DC | PRN

## 2020-06-29 NOTE — Progress Notes (Signed)
Continues to describe chest discomfort, but does not appear to be in any distress. Hemodynamically stable. Troponin with significant, but mild increase. Start IV NTG. Continue IV heparin and ASA. Beta blocker low dose with relative bradycardia. Trend troponin. May need urgent cath today if he develops frank ST elevation. Otherwise for cath on Monday.

## 2020-06-29 NOTE — Progress Notes (Signed)
Desats to 87 % on room air, placed on 2L [place] pulse ox- 92% continue to monitor.

## 2020-06-29 NOTE — Progress Notes (Signed)
Coordinated with CMT to alert RN for any ST elevation in the cardiac monitor.

## 2020-06-29 NOTE — Progress Notes (Signed)
ANTICOAGULATION CONSULT NOTE - Initial Consult  Pharmacy Consult for Heparin Indication: chest pain/ACS, LV apical thrombus  Allergies  Allergen Reactions  . Codeine Itching    Patient Measurements: Height: 6\' 1"  (185.4 cm) Weight: 78.9 kg (174 lb) IBW/kg (Calculated) : 79.9  Vital Signs: Temp: 98 F (36.7 C) (04/30 0318) Temp Source: Oral (04/30 0318) BP: 138/104 (04/30 0600) Pulse Rate: 72 (04/30 0600)  Labs: Recent Labs    06/29/20 0357  HGB 17.1*  HCT 53.5*  PLT 207  LABPROT 13.3  INR 1.0  CREATININE 0.96  TROPONINIHS 74*    Estimated Creatinine Clearance: 117.6 mL/min (by C-G formula based on SCr of 0.96 mg/dL).   Medical History: Past Medical History:  Diagnosis Date  . Atypical chest pain   . CAD (coronary artery disease)   . Dyslipidemia   . Ex-smoker   . History of depression   . Insomnia   . Ischemic cardiomyopathy    Ejection fraction 40-45%  . NSTEMI (non-ST elevated myocardial infarction) ([place]) 08/2007   Treated with a bare metal stent to the proximal LAD  . Suicide attempt ([place]) 01/2001    Medications:  See electronic med rec  Assessment: 35 y.o. M presents with CP. Pt recently started warfarin for L apical clot seen on ECHO 4/28. INR today is only 1. Pt does not know what dose of coumadin he was taking. Med rec tech will try to call outpatient pharmacy later today and see if we can figure out dose. To begin heparin for CP/ACS.   Goal of Therapy:  Heparin level 0.3-0.7 units/ml Monitor platelets by anticoagulation protocol: Yes   Plan:  Heparin IV bolus 4000 units Heparin gtt at 1100 units/hr Will f/u heparin level in 6 hours Daily heparin level and CBC  Sherlon Handing, PharmD, BCPS Please see amion for complete clinical pharmacist phone list 06/29/2020,6:36 AM

## 2020-06-29 NOTE — ED Notes (Signed)
Pt c/o chest pain getting more intense with a stabbing sensation. MD notified and at bedside.

## 2020-06-29 NOTE — H&P (Signed)
Cardiology Admission History and Physical:   Patient ID: RC AMISON MRN: [id number]; DOB: 01/14/1986   Admission date: 06/29/2020  PCP:  Neale Burly, MD   [place]  Cardiologist:  No primary care provider on file.  Advanced Practice Provider:  No care team member to display Electrophysiologist:  None        Chief Complaint:  Chest pain   Patient Profile:   Wyatt Donovan is a 35 y.o. male with coronary artery disease with a stent 10 years ago, cardiomyopathy with an EF of 40% and LV apical aneurysm, LV mural thrombus recently diagnosed, tobacco abuse who presents with active chest pain and elevated troponin.  History of Present Illness:   Wyatt Donovan is a 35 year old male with a prior history of CAD in 2012 where he had PCI to his LAD, ongoing tobacco abuse, who presents today after experiencing persistent substernal chest pain despite multiple nitroglycerin.  He has been having chest pain intermittently for the last year.  It happens exertionally and at rest.  Prior episodes have been relieved by nitroglycerin.  However this time he took 3 nitroglycerin within 1/2-hour.  And the pain continued to progress.  He had been lost to follow-up with cardiology for quite a while, however was recently seen because of this chest pain.  He was referred for echocardiography stress testing.  He had the echo done 2 days ago that revealed an ischemic cardiomyopathy with an EF of 35 to 40% and significant apical aneurysm with a layered mobile thrombus in the LV.  On 06/27/2020 he was started on warfarin for anticoagulation.  Of note he has not been taking aspirin for several months because of on and off blood in his stool.  On my examination emergency department, he complains of significant substernal chest pain.  Otherwise no other new symptoms.  Overall labs were unremarkable except for initial troponin of 74. ECG had some st depression and aVR elevation.  For this  reason and his active ongoing chest pain he was called for admission.   Past Medical History:  Diagnosis Date  . Atypical chest pain   . CAD (coronary artery disease)   . Dyslipidemia   . Ex-smoker   . History of depression   . Insomnia   . Ischemic cardiomyopathy    Ejection fraction 40-45%  . NSTEMI (non-ST elevated myocardial infarction) ([place]) 08/2007   Treated with a bare metal stent to the proximal LAD  . Suicide attempt ([place]) 01/2001    Past Surgical History:  Procedure Laterality Date  . ADENOIDECTOMY    . CORONARY STENT PLACEMENT     Bare metal stent to proximal LAD     Medications Prior to Admission: Prior to Admission medications   Medication Sig Start Date End Date Taking? Authorizing Provider  atorvastatin (LIPITOR) 40 MG tablet Take 40 mg by mouth daily. 04/04/20  Yes [provider]  metoprolol succinate (TOPROL-XL) 25 MG 24 hr tablet Take 25 mg by mouth daily. 06/10/20  Yes [provider]  nitroGLYCERIN (NITROSTAT) 0.4 MG SL tablet Place 0.4 mg under the tongue every 5 (five) minutes as needed for chest pain.   Yes [provider]  Warfarin Sodium (COUMADIN PO) Take 1 tablet by mouth daily. Pt does not know what strength   Yes [provider]     Allergies:    Allergies  Allergen Reactions  . Codeine Itching    Social History:   Social History  Socioeconomic History  . Marital status: Single    Spouse name: Not on file  . Number of children: Not on file  . Years of education: Not on file  . Highest education level: Not on file  Occupational History  . Occupation: Not actively working  Tobacco Use  . Smoking status: Current Every Day Smoker    Packs/day: 1.00    Years: 11.00    Pack years: 11.00  . Smokeless tobacco: Not on file  . Tobacco comment: Has smoked a pack per day since he was 14  Substance and Sexual Activity  . Alcohol use: Not on file  . Drug use: No  . Sexual activity: Not on file  Other  Topics Concern  . Not on file  Social History Narrative   Single   Lives with his parents   Trying to get his GED   Wyatt Donovan date all cultures   Social Determinants of Health   Financial Resource Strain: Not on file  Food Insecurity: Not on file  Transportation Needs: Not on file  Physical Activity: Not on file  Stress: Not on file  Social Connections: Not on file  Intimate Partner Violence: Not on file    Family History:   The patient's family history is pertinent for early onset CAD   ROS:  Please see the history of present illness.  All other ROS reviewed and negative.     Physical Exam/Data:   Vitals:   06/29/20 0500 06/29/20 0515 06/29/20 0530 06/29/20 0600  BP: 124/86 119/87 (!) 119/92 (!) 138/104  Pulse: (!) 57 62 (!) 55 72  Resp: 19 18 17 18   Temp:      TempSrc:      SpO2: 100% 100% 99% 100%  Weight:      Height:       No intake or output data in the 24 hours ending 06/29/20 0652 Last 3 Weights 06/29/2020  Weight (lbs) 174 lb  Weight (kg) 78.926 kg     Body mass index is 22.96 kg/m.  General:  NAD HEENT: normal, poor dentition  Lymph: no adenopathy Neck: no JVD Endocrine:  No thryomegaly Vascular: No carotid bruits; FA pulses 2+ bilaterally without bruits  Cardiac:  normal S1, S2; RRR; no murmur  Lungs:  clear to auscultation bilaterally, no wheezing, rhonchi or rales  Abd: soft, nontender, no hepatomegaly  Ext: no edema Musculoskeletal:  No deformities, BUE and BLE strength normal and equal Skin: warm and dry  Neuro:  CNs 2-12 intact, no focal abnormalities noted Psych:  Normal affect    EKG:  The ECG that was done  was personally reviewed and demonstrates aVR elevation and some Stdepression  Relevant CV Studies: Outside Echo 4/28: Summary 1. Technically difficult study. 2. The left ventricular systolic function is mildly to moderately decreased, LVEF is visually estimated at 40% in ischemic pattern. 3. LV apical aneurysm with thrombus as  below. 4. The left atrium is mildly dilated in size. 5. The right ventricle is normal in size, with normal systolic function. 6. No significant valvular abnormalities.  Laboratory Data:  High Sensitivity Troponin:   Recent Labs  Lab 06/29/20 0357 06/29/20 0549  TROPONINIHS 74* 366*      Chemistry Recent Labs  Lab 06/29/20 0357  NA 144  K 4.0  CL 109  CO2 24  GLUCOSE 103*  BUN 15  CREATININE 0.96  CALCIUM 9.1  GFRNONAA >60  ANIONGAP 11    Recent Labs  Lab 06/29/20 0357  PROT 6.4*  ALBUMIN 3.6  AST 36  ALT 44  ALKPHOS 69  BILITOT 0.7   Hematology Recent Labs  Lab 06/29/20 0357  WBC 11.4*  RBC 5.52  HGB 17.1*  HCT 53.5*  MCV 96.9  MCH 31.0  MCHC 32.0  RDW 13.4  PLT 207   BNPNo results for input(s): BNP, PROBNP in the last 168 hours.  DDimer No results for input(s): DDIMER in the last 168 hours.   Radiology/Studies:  DG Chest Port 1 View  Result Date: 06/29/2020 CLINICAL DATA:  Initial evaluation for acute chest pain. EXAM: PORTABLE CHEST 1 VIEW COMPARISON:  Prior radiograph from 04/04/2019. FINDINGS: The cardiac and mediastinal silhouettes are stable in size and contour, and remain within normal limits. The lungs are normally inflated. No airspace consolidation, pleural effusion, or pulmonary edema. No pneumothorax. No acute osseous abnormality. IMPRESSION: No active cardiopulmonary disease. Electronically Signed   By: Jeannine Boga M.D.   On: 06/29/2020 06:19     Assessment and Plan:   1. NSTEMI. Known history of early onset CAD with prior PCI in 2012 and family history of early onset CAD with active tobacco use and intermittent medication non-compliance due to insurance issues.  I am most worried about this LV thrombus that was noted to be mobile on most recent echo.  He was started on warfarin, however INR 1.0 here on arrival.  It is possible that he has embolized to her coronary artery.  Will load with aspirin, start heparin for  anticoagulation.  Given his early onset CAD, also just as likely this is a plaque rupture type I MI.  Troponin just back 74 -> 366. We will continue on statin and beta-blocker.  He had discontinued his aspirin because of some GI bleeding, however he states he has none now.  Have ordered an echo and he will need a left heart catheterization more urgently this hospitalization. 2. ICM. EF 35-40% with LV thrombus. Echo and heparin for anticoagulation. No clinical signs of heart failure    Risk Assessment/Risk Scores:     TIMI Risk Score for Unstable Angina or Non-ST Elevation MI:   The patient's TIMI risk score is 5, which indicates a 26% risk of all cause mortality, new or recurrent myocardial infarction or need for urgent revascularization in the next 14 days.       Severity of Illness: The appropriate patient status for this patient is INPATIENT. Inpatient status is judged to be reasonable and necessary in order to provide the required intensity of service to ensure the patient's safety. The patient's presenting symptoms, physical exam findings, and initial radiographic and laboratory data in the context of their chronic comorbidities is felt to place them at high risk for further clinical deterioration. Furthermore, it is not anticipated that the patient will be medically stable for discharge from the hospital within 2 midnights of admission. The following factors support the patient status of inpatient.   " The patient's presenting symptoms include active chest pain at rest. " The worrisome physical exam findings include chest pain. " The initial radiographic and laboratory data are worrisome because of elevated troponin. " The chronic co-morbidities include prior CAD/MI, ischemic cardiomyopathy, LV thrombus.   * I certify that at the point of admission it is my clinical judgment that the patient will require inpatient hospital care spanning beyond 2 midnights from the point of admission due to  high intensity of service, high risk for further deterioration and high frequency of surveillance required.*  For questions or updates, please contact [place] Please consult [email or web address] for contact info under     Signed, Doyne Keel, MD  06/29/2020 6:52 AM

## 2020-06-29 NOTE — ED Notes (Signed)
MD notified of critical troponin.

## 2020-06-29 NOTE — ED Triage Notes (Signed)
Pt bib [facility] c/o of chest pain that started last night. Pt describes the pain as centralized pressure that does not radiate. Denies N/V. Endorses SOB. Pt took 3 nitros and 4 baby Asprin at home prior to EMS arrival. 50 mcg fentanyl given by ems PTA. Pt also reports bloody stool that has been ongoing for a year. Pt states he had echo done on 4/28 that showed a clot in his heart that he is currently taking warfarin for.  Hx of stent placement in 2009.   BP: 126/80  HR: 56  RR: 22  Spo2: 100% on 4L [place]

## 2020-06-29 NOTE — Progress Notes (Signed)
  Echocardiogram 2D Echocardiogram has been performed.  Elmer Ramp 06/29/2020, 6:32 PM

## 2020-06-29 NOTE — ED Provider Notes (Signed)
[facility] EMERGENCY DEPARTMENT Provider Note   CSN: [id number] Arrival date & time: 06/29/20  0305     History Chief Complaint  Patient presents with  . Chest Pain    Wyatt Donovan is a 35 y.o. male.  Patient is a 35 year old male with past medical history of coronary artery disease with MI and stent placement in his early 71s.  Patient was seen by his cardiologist at [facility] yesterday.  He had an echocardiogram showing a left ventricular aneurysm with mural thrombus.  He was started on anticoagulation, then discharged home.  This evening, he began to experience sharp pain in the center of his chest, like a "knife sticking in him".  He attempted to take 3 nitroglycerin at home with little relief.  He was transported here by EMS for evaluation of these complaints.  He also took 4 baby aspirin and received 50 mcg of fentanyl by EMS.  The history is provided by the patient.       Past Medical History:  Diagnosis Date  . Atypical chest pain   . CAD (coronary artery disease)   . Dyslipidemia   . Ex-smoker   . History of depression   . Insomnia   . Ischemic cardiomyopathy    Ejection fraction 40-45%  . NSTEMI (non-ST elevated myocardial infarction) ([place]) 08/2007   Treated with a bare metal stent to the proximal LAD  . Suicide attempt ([place]) 01/2001    There are no problems to display for this patient.   Past Surgical History:  Procedure Laterality Date  . ADENOIDECTOMY    . CORONARY STENT PLACEMENT     Bare metal stent to proximal LAD       Family History  Problem Relation Age of Onset  . Hypertension Neg Hx   . Diabetes Neg Hx   . Coronary artery disease Neg Hx     Social History   Tobacco Use  . Smoking status: Current Every Day Smoker    Packs/day: 1.00    Years: 11.00    Pack years: 11.00  . Tobacco comment: Has smoked a pack per day since he was 14  Substance Use Topics  . Drug use: No    Home Medications Prior to  Admission medications   Medication Sig Start Date End Date Taking? Authorizing Provider  aspirin (ASPIR-TRIN) 325 MG EC tablet Take 325 mg by mouth daily.      [provider]  busPIRone (BUSPAR) 5 MG tablet Take 7.5 mg by mouth 2 (two) times daily.      [provider]  carvedilol (COREG) 3.125 MG tablet Take 3.125 mg by mouth 2 (two) times daily.      [provider]  clopidogrel (PLAVIX) 75 MG tablet Take 75 mg by mouth daily.      [provider]  lisinopril (PRINIVIL,ZESTRIL) 2.5 MG tablet Take 2.5 mg by mouth daily.      [provider]  nitroGLYCERIN (NITROSTAT) 0.4 MG SL tablet Place 0.4 mg under the tongue as directed.      [provider]  pravastatin (PRAVACHOL) 40 MG tablet Take 40 mg by mouth daily.      [provider]  ranitidine (ZANTAC) 150 MG tablet Take 150 mg by mouth 2 (two) times daily.      [provider]  temazepam (RESTORIL) 15 MG capsule Take 22.5 mg by mouth at bedtime as needed.      [provider]  Allergies    Codeine  Review of Systems   Review of Systems  All other systems reviewed and are negative.   Physical Exam Updated Vital Signs BP (!) 125/91   Pulse 60   Temp 98 F (36.7 C) (Oral)   Resp (!) 25   Ht 6\' 1"  (1.854 m)   Wt 78.9 kg   SpO2 100%   BMI 22.96 kg/m   Physical Exam Vitals and nursing note reviewed.  Constitutional:      General: He is not in acute distress.    Appearance: He is well-developed. He is not diaphoretic.  HENT:     Head: Normocephalic and atraumatic.  Cardiovascular:     Rate and Rhythm: Normal rate and regular rhythm.     Heart sounds: No murmur heard. No friction rub.  Pulmonary:     Effort: Pulmonary effort is normal. No respiratory distress.     Breath sounds: Normal breath sounds. No wheezing or rales.  Abdominal:     General: Bowel sounds are normal. There is no distension.     Palpations: Abdomen is soft.      Tenderness: There is no abdominal tenderness.  Musculoskeletal:        General: Normal range of motion.     Cervical back: Normal range of motion and neck supple.  Skin:    General: Skin is warm and dry.  Neurological:     Mental Status: He is alert and oriented to person, place, and time.     Coordination: Coordination normal.     ED Results / Procedures / Treatments   Labs (all labs ordered are listed, but only abnormal results are displayed) Labs Reviewed  COMPREHENSIVE METABOLIC PANEL  CBC WITH DIFFERENTIAL/PLATELET  TROPONIN I (HIGH SENSITIVITY)    EKG EKG Interpretation  Date/Time:  Saturday June 29 2020 03:58:15 EDT Ventricular Rate:  53 PR Interval:  164 QRS Duration: 132 QT Interval:  420 QTC Calculation: 395 R Axis:   82 Text Interpretation: Sinus rhythm Nonspecific intraventricular conduction delay Abnormal inferior Q waves Repol abnrm,possible ischemic changes Confirmed by Veryl Speak [phone]) on 06/29/2020 4:05:37 AM   Radiology No results found.  Procedures Procedures   Medications Ordered in ED Medications  morphine 4 MG/ML injection 4 mg (has no administration in time range)    ED Course  I have reviewed the triage vital signs and the nursing notes.  Pertinent labs & imaging results that were available during my care of the patient were reviewed by me and considered in my medical decision making (see chart for details).    MDM Rules/Calculators/A&P  Patient with extensive cardiac history including MI at the age of 42 and prior stents.  Patient presents with chest pain.  He was seen by his cardiologist 2 days ago and found to have a left ventricular aneurysm with mural thrombus.  He was started on Coumadin.  He started with sharp pains in his chest yesterday which have worsened.  He arrives here complaining of severe pain requiring morphine for relief.  Initial EKG shows possible ischemic changes, but no acute MI.  Initial troponin has  returned at 74.  I have discussed the care with cardiology who has evaluated the patient and will admit.  Second troponin is pending at this time.  CRITICAL CARE Performed by: Veryl Speak Total critical care time: 35 minutes Critical care time was exclusive of separately billable procedures and treating other patients. Critical care was necessary to treat or prevent imminent or  life-threatening deterioration. Critical care was time spent personally by me on the following activities: development of treatment plan with patient and/or surrogate as well as nursing, discussions with consultants, evaluation of patient's response to treatment, examination of patient, obtaining history from patient or surrogate, ordering and performing treatments and interventions, ordering and review of laboratory studies, ordering and review of radiographic studies, pulse oximetry and re-evaluation of patient's condition.   Final Clinical Impression(s) / ED Diagnoses Final diagnoses:  None    Rx / DC Orders ED Discharge Orders    None       Veryl Speak, MD 06/29/20 [phone]

## 2020-06-29 NOTE — Progress Notes (Signed)
Latest troponin level-25,186 with constant chest pain, DR. Croitoru made aware with order to repeat troponin level in am.

## 2020-06-29 NOTE — Plan of Care (Signed)
New pt admission from ED. Pt brought to the floor in stable condition. Vitals taken. Initial Assessment done. All immediate pertinent needs to patient addressed. Patient Guide given to patient. Important safety instructions relating to hospitalization reviewed with patient. Patient verbalized understanding. Will continue to monitor pt. 

## 2020-06-29 NOTE — ED Notes (Signed)
Pt states wanting nitro changed pt having CP. Sarah RN made aware.

## 2020-06-29 NOTE — Progress Notes (Signed)
Pt. Claimed that his chest pain is scale of 7 non-radiating . Nitro gtt titrated . Pt continue to say that he has chest pain but assessing him physically  doesn't show any signs of pain.  Able to carry conversation telling stories  And when ask if he has chest pain always say scale of 7. Continue to monitor.

## 2020-06-29 NOTE — Progress Notes (Addendum)
[place] for Heparin Indication: chest pain/ACS, LV apical thrombus  Allergies  Allergen Reactions  . Codeine Itching    Patient Measurements: Height: 6\' 1"  (185.4 cm) Weight: 78.3 kg (172 lb 9.6 oz) IBW/kg (Calculated) : 79.9  Vital Signs: Temp: 98.4 F (36.9 C) (04/30 1629) Temp Source: Oral (04/30 1629) BP: 105/68 (04/30 1629) Pulse Rate: 71 (04/30 1629)  Labs: Recent Labs    06/29/20 0357 06/29/20 0549 06/29/20 1437  HGB 17.1*  --   --   HCT 53.5*  --   --   PLT 207  --   --   LABPROT 13.3  --   --   INR 1.0  --   --   HEPARINUNFRC  --   --  0.32  CREATININE 0.96  --   --   TROPONINIHS 74* 366* 25,186*    Estimated Creatinine Clearance: 116.7 mL/min (by C-G formula based on SCr of 0.96 mg/dL).   Medical History: Past Medical History:  Diagnosis Date  . Atypical chest pain   . CAD (coronary artery disease)   . Dyslipidemia   . Ex-smoker   . History of depression   . Insomnia   . Ischemic cardiomyopathy    Ejection fraction 40-45%  . NSTEMI (non-ST elevated myocardial infarction) ([place]) 08/2007   Treated with a bare metal stent to the proximal LAD  . Suicide attempt ([place]) 01/2001     Assessment: 35 y.o. M presents with CP. Pt recently started warfarin for L apical clot seen on ECHO 4/28. Marland Kitchen Pt does not know what dose of coumadin he was taking. Pharmacy dosing heparin. Plans noted for cath on Monday -initial heparin level at goal    Goal of Therapy:  Heparin level 0.3-0.7 units/ml Monitor platelets by anticoagulation protocol: Yes   Plan:  -Continue heparin at 1100 units/hr -recheck heparin level later today -Daily heparin level and CBC  Hildred Laser, PharmD Clinical Pharmacist **Pharmacist phone directory can now be found on [email or web address] ([email or web address]).  Listed under [place].   Addendum -heparin level remains at goal  Plan -Continue heparin 1100 units/hr -Daily heparin level and aPTT  Hildred Laser,  PharmD Clinical Pharmacist **Pharmacist phone directory can now be found on [email or web address] ([email or web address]).  Listed under [place].

## 2020-06-30 DIAGNOSIS — I214 Non-ST elevation (NSTEMI) myocardial infarction: Secondary | ICD-10-CM

## 2020-06-30 DIAGNOSIS — F17200 Nicotine dependence, unspecified, uncomplicated: Secondary | ICD-10-CM

## 2020-06-30 DIAGNOSIS — I519 Heart disease, unspecified: Secondary | ICD-10-CM

## 2020-06-30 DIAGNOSIS — E785 Hyperlipidemia, unspecified: Secondary | ICD-10-CM

## 2020-06-30 LAB — ECHOCARDIOGRAM COMPLETE
AR max vel: 2.85 cm2
AV Area VTI: 2.75 cm2
AV Area mean vel: 2.73 cm2
AV Mean grad: 2 mmHg
AV Peak grad: 2.9 mmHg
Ao pk vel: 0.86 m/s
Area-P 1/2: 5.02 cm2
Calc EF: 42.7 %
Height: 73 in
S' Lateral: 4.2 cm
Single Plane A2C EF: 35.6 %
Single Plane A4C EF: 51.1 %
Weight: 2761.6 oz

## 2020-06-30 LAB — CBC
HCT: 45.5 % (ref 39.0–52.0)
Hemoglobin: 14.7 g/dL (ref 13.0–17.0)
MCH: 31.1 pg (ref 26.0–34.0)
MCHC: 32.3 g/dL (ref 30.0–36.0)
MCV: 96.2 fL (ref 80.0–100.0)
Platelets: 178 K/uL (ref 150–400)
RBC: 4.73 MIL/uL (ref 4.22–5.81)
RDW: 13.3 % (ref 11.5–15.5)
WBC: 12.3 K/uL — ABNORMAL HIGH (ref 4.0–10.5)
nRBC: 0 % (ref 0.0–0.2)

## 2020-06-30 LAB — TROPONIN I (HIGH SENSITIVITY): Troponin I (High Sensitivity): >27000 ng/L (ref <18)

## 2020-06-30 LAB — HEPARIN LEVEL (UNFRACTIONATED): Heparin Unfractionated: 0.32 [IU]/mL (ref 0.30–0.70)

## 2020-06-30 LAB — HEMOGLOBIN A1C
Hgb A1c MFr Bld: 5.5 % (ref 4.8–5.6)
Mean Plasma Glucose: 111.15 mg/dL

## 2020-06-30 MED ORDER — SODIUM CHLORIDE 0.9 % IV SOLN: INTRAVENOUS | Status: DC

## 2020-06-30 MED ORDER — SODIUM CHLORIDE 0.9 % IV SOLN: 250.0000 mL | INTRAVENOUS | Status: DC | PRN

## 2020-06-30 MED ORDER — METOPROLOL SUCCINATE ER 25 MG PO TB24
12.5000 mg | ORAL_TABLET | Freq: Every day | ORAL | Status: DC
  Administered 2020-06-30 – 2020-07-02 (×3): 12.5 mg via ORAL
  Filled 2020-06-30 (×3): qty 1

## 2020-06-30 MED ORDER — METOPROLOL SUCCINATE ER 25 MG PO TB24: 12.5000 mg | ORAL_TABLET | Freq: Every day | ORAL | Status: DC

## 2020-06-30 MED ORDER — SODIUM CHLORIDE 0.9% FLUSH
3.0000 mL | INTRAVENOUS | Status: DC | PRN
  Administered 2020-06-30: 3 mL via INTRAVENOUS

## 2020-06-30 MED ORDER — ASPIRIN 81 MG PO CHEW
81.0000 mg | CHEWABLE_TABLET | Freq: Once | ORAL | Status: AC
  Administered 2020-07-01: 81 mg via ORAL
  Filled 2020-06-30: qty 1

## 2020-06-30 MED ORDER — SODIUM CHLORIDE 0.9% FLUSH
3.0000 mL | Freq: Two times a day (BID) | INTRAVENOUS | Status: DC
  Administered 2020-06-30 – 2020-07-02 (×3): 3 mL via INTRAVENOUS

## 2020-06-30 NOTE — Progress Notes (Signed)
   06/29/20 2300  Assess: MEWS Score  Temp 98.6 F (37 C)  BP 97/70  Pulse Rate 66  ECG Heart Rate 65  Resp 12  SpO2 94 %  O2 Device Nasal Cannula  O2 Flow Rate (L/min) 2 L/min  Assess: MEWS Score  MEWS Temp 0  MEWS Systolic 1  MEWS Pulse 0  MEWS RR 1  MEWS LOC 0  MEWS Score 2  MEWS Score Color Yellow  Assess: if the MEWS score is Yellow or Red  Were vital signs taken at a resting state? Yes  Focused Assessment No change from prior assessment  Early Detection of Sepsis Score *See Row Information* Low  MEWS guidelines implemented *See Row Information* Yes  Treat  MEWS Interventions Escalated (See documentation below)  Take Vital Signs  Increase Vital Sign Frequency  Yellow: Q 2hr X 2 then Q 4hr X 2, if remains yellow, continue Q 4hrs  Escalate  MEWS: Escalate Yellow: discuss with charge nurse/RN and consider discussing with provider and RRT  Notify: Charge Nurse/RN  Name of Charge Nurse/RN Notified Tanya, RN  Date Charge Nurse/RN Notified 06/29/20  Time Charge Nurse/RN Notified 2300  Document  Patient Outcome Other (Comment)  Progress note created (see row info) Yes

## 2020-06-30 NOTE — Progress Notes (Signed)
New bag of heparin removed @0202  was wasted with RN Ardelia Mems due to accidental puncturing of the bag. Another bag removed and hung.

## 2020-06-30 NOTE — Progress Notes (Signed)
Call from patient who had asked to sleep upon initial rounds a 0720.  States chest pain 8/10. NTG at 15 mcg, heparin at 1100 units,  Patient states the NTG doesn't help his pain. Patient points to pain spot to the left of sternum, reports it does not go anywhere, worse with movement, intermittent SOB but always related to chest pain.  BP 99 sysytolic fearing increasing the NTG will drop BP lower.  Will give 2 vicodin to see if it helps the pain.  Cardiology will be rounding soon. Patient to go for heart cath tomorrow

## 2020-06-30 NOTE — Progress Notes (Signed)
Patient having ST elevation, shown in the cardiac monitor. EKG done showing SR with 1 degree AV block. MD paged and made aware. No new orders received. Will continue to monitor the patient.

## 2020-06-30 NOTE — Progress Notes (Addendum)
Progress Note  Patient Name: Wyatt Donovan Date of Encounter: 06/30/2020  [facility] Cardiologist: No primary care provider on file.   Subjective   No major events overnight, but he is continuing to have chest discomfort.  IV nitroglycerin dose limited by borderline low blood pressure.  No significant arrhythmia.  Denies dyspnea. Echocardiogram shows 2 distinct areas of wall motion abnormality.  He has an inferolateral area of moderate hypokinesis which probably represents his acute infarction.  He also has a small segment of anteroapical akinesis with an attached thrombus that appears to be organized, likely a consequence of his acute anterior infarction in the remote past.  Inpatient Medications    Scheduled Meds: . aspirin  81 mg Oral Daily  . atorvastatin  40 mg Oral Daily  . metoprolol succinate  25 mg Oral Daily   Continuous Infusions: . heparin 1,100 Units/hr (06/30/20 0345)  . nitroGLYCERIN 50 mcg/min (06/30/20 0450)   PRN Meds: acetaminophen, HYDROcodone-acetaminophen, HYDROmorphone (DILAUDID) injection, nitroGLYCERIN, ondansetron (ZOFRAN) IV   Vital Signs    Vitals:   06/30/20 0000 06/30/20 0300 06/30/20 0800 06/30/20 0900  BP: 99/69 102/70 93/75 99/74   Pulse: 64 82 80 91  Resp: 11 15 14 16   Temp:  98.8 F (37.1 C) 98.6 F (37 C)   TempSrc:  Oral Oral   SpO2:  96% 95% 94%  Weight:  79.8 kg    Height:        Intake/Output Summary (Last 24 hours) at 06/30/2020 0945 Last data filed at 06/30/2020 0900 Gross per 24 hour  Intake 375.61 ml  Output 550 ml  Net -174.39 ml   Last 3 Weights 06/30/2020 06/29/2020 06/29/2020  Weight (lbs) 175 lb 14.8 oz 172 lb 9.6 oz 174 lb  Weight (kg) 79.8 kg 78.291 kg 78.926 kg      Telemetry    Sinus rhythm, occasional PVCs- Personally Reviewed  ECG    Inferior Q waves and tall R wave in lead V1 consistent with inferoposterior infarction; no ST-T changes - Personally Reviewed  Physical Exam   Lying fully flat in bed,  appears comfortable GEN: No acute distress.   Neck: No JVD Cardiac: RRR, no murmurs, rubs, or gallops.  Respiratory: Clear to auscultation bilaterally. GI: Soft, nontender, non-distended  MS: No edema; No deformity. Neuro:  Nonfocal  Psych: Normal affect   Labs    High Sensitivity Troponin:   Recent Labs  Lab 06/29/20 0357 06/29/20 0549 06/29/20 1437 06/30/20 0544  TROPONINIHS 74* 366* 25,186* >27,000*      Chemistry Recent Labs  Lab 06/29/20 0357  NA 144  K 4.0  CL 109  CO2 24  GLUCOSE 103*  BUN 15  CREATININE 0.96  CALCIUM 9.1  PROT 6.4*  ALBUMIN 3.6  AST 36  ALT 44  ALKPHOS 69  BILITOT 0.7  GFRNONAA >60  ANIONGAP 11     Hematology Recent Labs  Lab 06/29/20 0357 06/30/20 0013  WBC 11.4* 12.3*  RBC 5.52 4.73  HGB 17.1* 14.7  HCT 53.5* 45.5  MCV 96.9 96.2  MCH 31.0 31.1  MCHC 32.0 32.3  RDW 13.4 13.3  PLT 207 178    BNP Recent Labs  Lab 06/29/20 0712  BNP 129.9*     DDimer No results for input(s): DDIMER in the last 168 hours.   Radiology    DG Chest Port 1 View  Result Date: 06/29/2020 CLINICAL DATA:  Initial evaluation for acute chest pain. EXAM: PORTABLE CHEST 1 VIEW COMPARISON:  Prior  radiograph from 04/04/2019. FINDINGS: The cardiac and mediastinal silhouettes are stable in size and contour, and remain within normal limits. The lungs are normally inflated. No airspace consolidation, pleural effusion, or pulmonary edema. No pneumothorax. No acute osseous abnormality. IMPRESSION: No active cardiopulmonary disease. Electronically Signed   By: Jeannine Boga M.D.   On: 06/29/2020 06:19    Cardiac Studies   Echocardiogram 06/29/2020  1. 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 Left ventricular ejection fraction,  by estimation, is 40 to 45%. The  left ventricle has mildly decreased  function. The left ventricle  demonstrates regional wall motion abnormalities (see scoring  diagram/findings for description). Left ventricular diastolic parameters  are consistent with Grade II diastolic dysfunction  (pseudonormalization). Elevated left atrial pressure. There is severe  hypokinesis of the entire left ventricular inferolateral wall. There is  moderate hypokinesis of the left ventricular inferior wall. There is  akinesis of a small segment of the apical  anterior wall.  2. Right ventricular systolic function is normal. The right ventricular  size is normal.  3. Left atrial size was mildly dilated.  4. The mitral valve is normal in structure. Mild mitral valve  regurgitation.  5. The aortic valve is normal in structure. Aortic valve regurgitation is  not visualized.   Patient Profile     35 y.o. male with a very early onset CAD and remote anterior STEMI with LAD stent at age 1, presenting with unstable angina, evolved to posterior wall infarction and mild reduction in overall LVEF.  Echocardiogram shows a possible apical left ventricular thrombus, but this appears to be organized possibly calcified.  Assessment & Plan    1.  Acute posterior MI: Findings on echo and the marked increase in troponin suggestive total occlusion of the posterolateral ventricular branch or maybe the distal left circumflex coronary artery.  He is on IV heparin and IV nitroglycerin and beta-blockers.  He continues to have mild chest discomfort.  Scheduled for coronary angiography and possible revascularization tomorrow.  This procedure has been fully reviewed with the patient and written informed consent has been obtained. 2. LV dysfunction: Mildly depressed LVEF, so far without any heart failure manifestations clinically, but with echo suggestions of elevated filling pressures.  Blood pressure does not allow adding ACEi or ARB at this point.  Watch carefully for development of frank heart  failure. 3. Dyslipidemia: Primarily a very low HDL cholesterol.  Strong family history of early onset CAD in his mother and maternal grandfather.  High-dose statin.  He is lean and does not have diabetes. 4.  Smoker: repeatedly encouraged permanent smoking cessation. 5.  LV thrombus: Cannot say 449% certainty, but suspect that this is a sequelae of his remote anterior infarction.  The thrombus appears to be fixed and is very hyperechogenic suggestion of partial calcification, but does protrude into the LV cavity.  He is currently on IV heparin.  Avoid crossing the aortic valve with a catheter.   For questions or updates, please contact [place] Please consult [email or web address] for contact info under        Signed, Sanda Klein, MD  06/30/2020, 9:45 AM

## 2020-06-30 NOTE — Progress Notes (Signed)
[place] for Heparin Indication: chest pain/ACS, LV apical thrombus  Allergies  Allergen Reactions  . Codeine Itching    Patient Measurements: Height: 6\' 1"  (185.4 cm) Weight: 79.8 kg (175 lb 14.8 oz) IBW/kg (Calculated) : 79.9  Vital Signs: Temp: 98.6 F (37 C) (05/01 0800) Temp Source: Oral (05/01 0800) BP: 93/75 (05/01 0800) Pulse Rate: 80 (05/01 0800)  Labs: Recent Labs    06/29/20 0357 06/29/20 0549 06/29/20 1437 06/29/20 2109 06/30/20 0013 06/30/20 0544  HGB 17.1*  --   --   --  14.7  --   HCT 53.5*  --   --   --  45.5  --   PLT 207  --   --   --  178  --   LABPROT 13.3  --   --   --   --   --   INR 1.0  --   --   --   --   --   HEPARINUNFRC  --   --  0.32 0.37 0.32  --   CREATININE 0.96  --   --   --   --   --   TROPONINIHS 74* 366* 25,186*  --   --  >27,000*    Estimated Creatinine Clearance: 118.9 mL/min (by C-G formula based on SCr of 0.96 mg/dL).   Medical History: Past Medical History:  Diagnosis Date  . Atypical chest pain   . CAD (coronary artery disease)   . Dyslipidemia   . Ex-smoker   . History of depression   . Insomnia   . Ischemic cardiomyopathy    Ejection fraction 40-45%  . NSTEMI (non-ST elevated myocardial infarction) ([place]) 08/2007   Treated with a bare metal stent to the proximal LAD  . Suicide attempt ([place]) 01/2001     Assessment: 35 y.o. M presents with CP. Pt recently started warfarin for L apical clot seen on ECHO 4/28. Marland Kitchen Pt does not know what dose of coumadin he was taking. Pharmacy dosing heparin. Plans noted for cath on Monday.   Heparin level continues to be at goal (0.3) on 1100 units/hr. Hgb down 17>14.7 but within normal limits. Plt count normal.   Goal of Therapy:  Heparin level 0.3-0.7 units/ml Monitor platelets by anticoagulation protocol: Yes   Plan:  -Continue heparin at 1100 units/hr -Daily heparin level and CBC  Erin Hearing PharmD., BCPS Clinical  Pharmacist 06/30/2020 8:52 AM  **Pharmacist phone directory can now be found on [email or web address] ([email or web address]).  Listed under [place].

## 2020-06-30 NOTE — Plan of Care (Signed)

## 2020-07-01 ENCOUNTER — Encounter (HOSPITAL_COMMUNITY): Payer: Self-pay | Admitting: Cardiovascular Disease

## 2020-07-01 ENCOUNTER — Inpatient Hospital Stay (HOSPITAL_COMMUNITY)
Admission: EM | Disposition: A | Discharge status: Home / Self Care | Payer: Self-pay | Source: Home / Self Care | Attending: Thoracic Surgery (Cardiothoracic Vascular Surgery)

## 2020-07-01 DIAGNOSIS — K089 Disorder of teeth and supporting structures, unspecified: Secondary | ICD-10-CM

## 2020-07-01 DIAGNOSIS — R079 Chest pain, unspecified: Secondary | ICD-10-CM

## 2020-07-01 DIAGNOSIS — Z720 Tobacco use: Secondary | ICD-10-CM

## 2020-07-01 DIAGNOSIS — I25110 Atherosclerotic heart disease of native coronary artery with unstable angina pectoris: Secondary | ICD-10-CM

## 2020-07-01 DIAGNOSIS — I214 Non-ST elevation (NSTEMI) myocardial infarction: Secondary | ICD-10-CM

## 2020-07-01 HISTORY — PX: LEFT HEART CATH AND CORONARY ANGIOGRAPHY: CATH118249

## 2020-07-01 LAB — CBC
HCT: 42.8 % (ref 39.0–52.0)
Hemoglobin: 14 g/dL (ref 13.0–17.0)
MCH: 31.2 pg (ref 26.0–34.0)
MCHC: 32.7 g/dL (ref 30.0–36.0)
MCV: 95.3 fL (ref 80.0–100.0)
Platelets: 151 10*3/uL (ref 150–400)
RBC: 4.49 MIL/uL (ref 4.22–5.81)
RDW: 13.1 % (ref 11.5–15.5)
WBC: 17.1 10*3/uL — ABNORMAL HIGH (ref 4.0–10.5)
nRBC: 0 % (ref 0.0–0.2)

## 2020-07-01 LAB — BASIC METABOLIC PANEL
Anion gap: 7 (ref 5–15)
BUN: 8 mg/dL (ref 6–20)
CO2: 27 mmol/L (ref 22–32)
Calcium: 8.4 mg/dL — ABNORMAL LOW (ref 8.9–10.3)
Chloride: 103 mmol/L (ref 98–111)
Creatinine, Ser: 0.99 mg/dL (ref 0.61–1.24)
GFR, Estimated: >60 mL/min (ref >60)
Glucose, Bld: 114 mg/dL — ABNORMAL HIGH (ref 70–99)
Potassium: 4.3 mmol/L (ref 3.5–5.1)
Sodium: 137 mmol/L (ref 135–145)

## 2020-07-01 LAB — HEPARIN LEVEL (UNFRACTIONATED)
Heparin Unfractionated: 0.26 IU/mL — ABNORMAL LOW (ref 0.30–0.70)
Heparin Unfractionated: 0.19 IU/mL — ABNORMAL LOW (ref 0.30–0.70)
Heparin Unfractionated: 0.19 IU/mL — ABNORMAL LOW (ref 0.30–0.70)

## 2020-07-01 LAB — PROTIME-INR
INR: 1.6 — ABNORMAL HIGH (ref 0.8–1.2)
Prothrombin Time: 18.8 seconds — ABNORMAL HIGH (ref 11.4–15.2)

## 2020-07-01 LAB — POCT ACTIVATED CLOTTING TIME: Activated Clotting Time: 339 seconds

## 2020-07-01 SURGERY — LEFT HEART CATH AND CORONARY ANGIOGRAPHY
Anesthesia: LOCAL

## 2020-07-01 MED ORDER — ASPIRIN 81 MG PO CHEW: 81.0000 mg | CHEWABLE_TABLET | Freq: Every day | ORAL | Status: DC

## 2020-07-01 MED ORDER — SODIUM CHLORIDE 0.9 % IV SOLN
INTRAVENOUS | Status: AC | PRN
  Administered 2020-07-01: 250 mL via INTRAVENOUS

## 2020-07-01 MED ORDER — FENTANYL CITRATE (PF) 100 MCG/2ML IJ SOLN
INTRAMUSCULAR | Status: AC
  Filled 2020-07-01: qty 2

## 2020-07-01 MED ORDER — HEPARIN SODIUM (PORCINE) 1000 UNIT/ML IJ SOLN
INTRAMUSCULAR | Status: AC
  Filled 2020-07-01: qty 1

## 2020-07-01 MED ORDER — HYDRALAZINE HCL 20 MG/ML IJ SOLN: 10.0000 mg | INTRAMUSCULAR | Status: AC | PRN

## 2020-07-01 MED ORDER — ONDANSETRON HCL 4 MG/2ML IJ SOLN: 4.0000 mg | Freq: Four times a day (QID) | INTRAMUSCULAR | Status: DC | PRN

## 2020-07-01 MED ORDER — SODIUM CHLORIDE 0.9% FLUSH: 3.0000 mL | INTRAVENOUS | Status: DC | PRN

## 2020-07-01 MED ORDER — LABETALOL HCL 5 MG/ML IV SOLN: 10.0000 mg | INTRAVENOUS | Status: AC | PRN

## 2020-07-01 MED ORDER — VERAPAMIL HCL 2.5 MG/ML IV SOLN
INTRAVENOUS | Status: AC
  Filled 2020-07-01: qty 2

## 2020-07-01 MED ORDER — NITROGLYCERIN 1 MG/10 ML FOR IR/CATH LAB
INTRA_ARTERIAL | Status: AC
  Filled 2020-07-01: qty 10

## 2020-07-01 MED ORDER — SODIUM CHLORIDE 0.9 % IV SOLN: INTRAVENOUS | Status: AC

## 2020-07-01 MED ORDER — LIDOCAINE HCL (PF) 1 % IJ SOLN
INTRAMUSCULAR | Status: DC | PRN
  Administered 2020-07-01: 2 mL via INTRADERMAL

## 2020-07-01 MED ORDER — IOHEXOL 350 MG/ML SOLN
INTRAVENOUS | Status: DC | PRN
  Administered 2020-07-01: 75 mL

## 2020-07-01 MED ORDER — MIDAZOLAM HCL 2 MG/2ML IJ SOLN
INTRAMUSCULAR | Status: DC | PRN
  Administered 2020-07-01: 0.5 mg via INTRAVENOUS

## 2020-07-01 MED ORDER — SODIUM CHLORIDE 0.9 % IV SOLN: 250.0000 mL | INTRAVENOUS | Status: DC | PRN

## 2020-07-01 MED ORDER — MIDAZOLAM HCL 2 MG/2ML IJ SOLN
INTRAMUSCULAR | Status: AC
  Filled 2020-07-01: qty 2

## 2020-07-01 MED ORDER — ACETAMINOPHEN 325 MG PO TABS: 650.0000 mg | ORAL_TABLET | ORAL | Status: DC | PRN

## 2020-07-01 MED ORDER — HEPARIN (PORCINE) IN NACL 1000-0.9 UT/500ML-% IV SOLN
INTRAVENOUS | Status: DC | PRN
  Administered 2020-07-01 (×2): 500 mL

## 2020-07-01 MED ORDER — HEPARIN (PORCINE) IN NACL 1000-0.9 UT/500ML-% IV SOLN
INTRAVENOUS | Status: AC
  Filled 2020-07-01: qty 1000

## 2020-07-01 MED ORDER — FENTANYL CITRATE (PF) 100 MCG/2ML IJ SOLN
INTRAMUSCULAR | Status: DC | PRN
  Administered 2020-07-01: 12.5 ug via INTRAVENOUS

## 2020-07-01 MED ORDER — HEPARIN SODIUM (PORCINE) 1000 UNIT/ML IJ SOLN
INTRAMUSCULAR | Status: DC | PRN
  Administered 2020-07-01: 4000 [IU] via INTRAVENOUS
  Administered 2020-07-01: 4500 [IU] via INTRAVENOUS

## 2020-07-01 MED ORDER — HEPARIN (PORCINE) 25000 UT/250ML-% IV SOLN
1850.0000 [IU]/h | INTRAVENOUS | Status: DC
  Administered 2020-07-01: 1300 [IU]/h via INTRAVENOUS
  Administered 2020-07-02: 1850 [IU]/h via INTRAVENOUS
  Administered 2020-07-02: 1500 [IU]/h via INTRAVENOUS
  Filled 2020-07-01 (×3): qty 250

## 2020-07-01 MED ORDER — SODIUM CHLORIDE 0.9% FLUSH
3.0000 mL | Freq: Two times a day (BID) | INTRAVENOUS | Status: DC
  Administered 2020-07-02: 3 mL via INTRAVENOUS

## 2020-07-01 SURGICAL SUPPLY — 15 items
BALLN SAPPHIRE 2.0X12 (BALLOONS) ×2
BALLOON SAPPHIRE 2.0X12 (BALLOONS) ×1 IMPLANT
CATH OPTITORQUE TIG 4.0 5F (CATHETERS) ×2 IMPLANT
CATH VISTA GUIDE 6FR XB3.5 (CATHETERS) ×2 IMPLANT
DEVICE RAD COMP TR BAND LRG (VASCULAR PRODUCTS) ×2 IMPLANT
GLIDESHEATH SLEND A-KIT 6F 22G (SHEATH) ×2 IMPLANT
GUIDEWIRE INQWIRE 1.5J.035X260 (WIRE) ×1 IMPLANT
INQWIRE 1.5J .035X260CM (WIRE) ×2
KIT ENCORE 26 ADVANTAGE (KITS) ×2 IMPLANT
KIT HEART LEFT (KITS) ×2 IMPLANT
PACK CARDIAC CATHETERIZATION (CUSTOM PROCEDURE TRAY) ×2 IMPLANT
TRANSDUCER W/STOPCOCK (MISCELLANEOUS) ×2 IMPLANT
TUBING CIL FLEX 10 FLL-RA (TUBING) ×2 IMPLANT
WIRE ASAHI PROWATER 180CM (WIRE) ×2 IMPLANT
WIRE HI TORQ VERSACORE-J 145CM (WIRE) ×2 IMPLANT

## 2020-07-01 NOTE — Progress Notes (Signed)
Denied any further tingling and numbness on right hand. TR band removed ,gauze 2x2 and tegaderm applied. No bleeding noted . Continue to monitor.

## 2020-07-01 NOTE — Progress Notes (Signed)
ANTICOAGULATION CONSULT NOTE - Follow Up Consult  Pharmacy Consult for heparin Indication: 3vCAD and LV thrombus  Labs: Recent Labs    06/29/20 0357 06/29/20 0549 06/29/20 1437 06/29/20 2109 06/30/20 0013 06/30/20 0544 07/01/20 0044 07/01/20 0909 07/01/20 2259  HGB 17.1*  --   --   --  14.7  --  14.0  --   --   HCT 53.5*  --   --   --  45.5  --  42.8  --   --   PLT 207  --   --   --  178  --  151  --   --   LABPROT 13.3  --   --   --   --   --  18.8*  --   --   INR 1.0  --   --   --   --   --  1.6*  --   --   HEPARINUNFRC  --   --  0.32   < > 0.32  --  0.26* 0.19* 0.19*  CREATININE 0.96  --   --   --   --   --  0.99  --   --   TROPONINIHS 74* 366* 25,186*  --   --  >27,000*  --   --   --    < > = values in this interval not displayed.    Assessment: 35yo male subtherapeutic on heparin after resuming post-cath; no gtt issues or signs of bleeding per RN.  Goal of Therapy:  Heparin level 0.3-0.7 units/ml   Plan:  Will increase heparin gtt by 2-3 units/kg/hr to 1500 units/hr and check level in 6 hours.    Wynona Neat, PharmD, BCPS  07/01/2020,11:30 PM

## 2020-07-01 NOTE — Progress Notes (Addendum)
Progress Note  Patient Name: Wyatt Donovan Date of Encounter: 07/01/2020  [facility] Cardiologist: No primary care provider on file.   Subjective   Still with residual chest tightness, very low-grade.  Improved compared to admission.  Still smokes cigarettes.  Inpatient Medications    Scheduled Meds: . aspirin  81 mg Oral Daily  . atorvastatin  40 mg Oral Daily  . metoprolol succinate  12.5 mg Oral Daily  . sodium chloride flush  3 mL Intravenous Q12H   Continuous Infusions: . sodium chloride    . sodium chloride 10 mL/hr at 07/01/20 0554  . heparin 1,200 Units/hr (07/01/20 0800)  . nitroGLYCERIN 40 mcg/min (07/01/20 0800)   PRN Meds: sodium chloride, acetaminophen, HYDROcodone-acetaminophen, HYDROmorphone (DILAUDID) injection, nitroGLYCERIN, ondansetron (ZOFRAN) IV, sodium chloride flush   Vital Signs    Vitals:   07/01/20 0200 07/01/20 0300 07/01/20 0359 07/01/20 0731  BP: 95/71 106/71 96/63   Pulse: 93 95 93   Resp: 15 17 14    Temp:   98.6 F (37 C) 98.1 F (36.7 C)  TempSrc:   Oral Oral  SpO2: 97% 95% 97%   Weight:      Height:        Intake/Output Summary (Last 24 hours) at 07/01/2020 0929 Last data filed at 07/01/2020 0800 Gross per 24 hour  Intake 1000 ml  Output 2400 ml  Net -1400 ml   Last 3 Weights 06/30/2020 06/30/2020 06/29/2020  Weight (lbs) 173 lb 11.6 oz 175 lb 14.8 oz 172 lb 9.6 oz  Weight (kg) 78.8 kg 79.8 kg 78.291 kg      Telemetry    Normal sinus rhythm with occasional PVCs.- Personally Reviewed  ECG    Performed on Jun 30, 2020, reveals inferoposterior Q wave infarction.  No acute ST-T wave changes.  Compared to the admission EKG, inferior Q waves are deeper and the tall R wave in V1 is new representing evolution of inferoposterior MI.- Personally Reviewed  Physical Exam  Terrible dentition GEN: No acute distress.   Neck: No JVD Cardiac: RRR, no murmurs or rub.  Positive S4 gallop.Marland Kitchen  Respiratory: Clear to auscultation  bilaterally. GI: Soft, nontender, non-distended  MS: No edema; No deformity. Neuro:  Nonfocal  Psych: Normal affect   Labs    High Sensitivity Troponin:   Recent Labs  Lab 06/29/20 0357 06/29/20 0549 06/29/20 1437 06/30/20 0544  TROPONINIHS 74* 366* 25,186* >27,000*      Chemistry Recent Labs  Lab 06/29/20 0357 07/01/20 0044  NA 144 137  K 4.0 4.3  CL 109 103  CO2 24 27  GLUCOSE 103* 114*  BUN 15 8  CREATININE 0.96 0.99  CALCIUM 9.1 8.4*  PROT 6.4*  --   ALBUMIN 3.6  --   AST 36  --   ALT 44  --   ALKPHOS 69  --   BILITOT 0.7  --   GFRNONAA >60 >60  ANIONGAP 11 7     Hematology Recent Labs  Lab 06/29/20 0357 06/30/20 0013 07/01/20 0044  WBC 11.4* 12.3* 17.1*  RBC 5.52 4.73 4.49  HGB 17.1* 14.7 14.0  HCT 53.5* 45.5 42.8  MCV 96.9 96.2 95.3  MCH 31.0 31.1 31.2  MCHC 32.0 32.3 32.7  RDW 13.4 13.3 13.1  PLT 207 178 151    BNP Recent Labs  Lab 06/29/20 0712  BNP 129.9*     DDimer No results for input(s): DDIMER in the last 168 hours.   Radiology  ECHOCARDIOGRAM COMPLETE  Result Date: 06/30/2020    ECHOCARDIOGRAM REPORT   Patient Name:   Wyatt Donovan Date of Exam: 06/29/2020 Medical Rec #:  [id number]        Height:       73.0 in Accession #:    [id number]       Weight:       172.6 lb Date of Birth:  24-Sep-1985        BSA:          2.021 m Patient Age:    35 years         BP:           106/79 mmHg Patient Gender: M                HR:           73 bpm. Exam Location:  Inpatient Procedure: 2D Echo, Cardiac Doppler and Color Doppler Indications:    I25.5 Ischemic cardiomyopathy  History:        Patient has no prior history of Echocardiogram examinations.                 Previous Myocardial Infarction, CAD and Acute MI;                 Signs/Symptoms:Chest Pain. Known clot seen in the apex on echo                 two days prior at outside hospital.  Sonographer:    [place] Referring Phys: [id number] [place]  1. 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 Left ventricular ejection fraction, by estimation, is 40 to 45%. The left ventricle has mildly decreased function. The left ventricle demonstrates regional wall motion abnormalities (see scoring diagram/findings for description). Left ventricular diastolic parameters are consistent with Grade II diastolic dysfunction (pseudonormalization). Elevated left atrial pressure. There is severe hypokinesis of the entire left ventricular inferolateral wall. There is moderate hypokinesis of the left ventricular inferior wall. There is akinesis of a small segment of the apical anterior wall.  2. Right ventricular systolic function is normal. The right ventricular size is normal.  3. Left atrial size was mildly dilated.  4. The mitral valve is normal in structure. Mild mitral valve regurgitation.  5. The aortic valve is normal in structure. Aortic valve regurgitation is not visualized. Comparison(s): No prior Echocardiogram. 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 FINDINGS  Left Ventricle: There is a small (6x7 mm) hyperechogenic thrombus (possibly calcified) at the left ventricular apex, in the area of the akinetic anteroapical segment. It has the appearance of an old organized thrombus. Left ventricular ejection fraction, by estimation, is 40 to 45%. The left ventricle has mildly decreased function. The left ventricle demonstrates regional wall motion abnormalities. Severe hypokinesis of the left ventricular, entire inferolateral wall. Moderate hypokinesis of the left ventricular, entire inferior wall. Severe akinesis of the left ventricular, apical anterior wall. The left  ventricular internal cavity size was normal in size. There is no left ventricular hypertrophy. Left ventricular diastolic parameters are consistent with Grade II diastolic dysfunction (pseudonormalization). Elevated left atrial pressure. Right Ventricle: The right ventricular size is normal. No increase in right ventricular wall  thickness. Right ventricular systolic function is normal. Left Atrium: Left atrial size was mildly dilated. Right Atrium: Right atrial size was normal in size. Pericardium: There is no evidence of pericardial effusion. Mitral Valve: The mitral valve is normal in structure. Mild mitral valve regurgitation, with centrally-directed jet. Tricuspid Valve: The tricuspid valve is normal in structure. Tricuspid valve regurgitation is not demonstrated. Aortic Valve: The aortic valve is normal in structure. Aortic valve regurgitation is not visualized. Aortic valve mean gradient measures 2.0 mmHg. Aortic valve peak gradient measures 2.9 mmHg. Aortic valve area, by VTI measures 2.75 cm. Pulmonic Valve: The pulmonic valve was normal in structure. Pulmonic valve regurgitation is not visualized. Aorta: The aortic root and ascending aorta are structurally normal, with no evidence of dilitation. IAS/Shunts: No atrial level shunt detected by color flow Doppler.  LEFT VENTRICLE PLAX 2D LVIDd:         5.40 cm      Diastology LVIDs:         4.20 cm      LV e' medial:    8.81 cm/s LV PW:         0.90 cm      LV E/e' medial:  7.5 LV IVS:        0.80 cm      LV e' lateral:   8.70 cm/s LVOT diam:     2.30 cm      LV E/e' lateral: 7.6 LV SV:         40 LV SV Index:   20 LVOT Area:     4.15 cm  LV Volumes (MOD) LV vol d, MOD A2C: 115.0 ml LV vol d, MOD A4C: 157.0 ml LV vol s, MOD A2C: 74.1 ml LV vol s, MOD A4C: 76.8 ml LV SV MOD A2C:     40.9 ml LV SV MOD A4C:     157.0 ml LV SV MOD BP:      58.6 ml RIGHT VENTRICLE RV Basal diam:  3.40 cm LEFT ATRIUM             Index       RIGHT ATRIUM           Index LA diam:         3.70 cm 1.83 cm/m  RA Area:     20.20 cm LA Vol (A2C):   67.9 ml 33.60 ml/m RA Volume:   54.00 ml  26.72 ml/m LA Vol (A4C):   49.1 ml 24.29 ml/m LA Biplane Vol: 59.3 ml 29.34 ml/m  AORTIC VALVE AV Area (Vmax):    2.85 cm AV Area (Vmean):   2.73 cm AV Area (VTI):     2.75 cm AV Vmax:           85.70 cm/s AV Vmean:          56.700 cm/s AV VTI:            0.147 m AV Peak Grad:      2.9 mmHg AV Mean Grad:      2.0 mmHg LVOT Vmax:         58.70 cm/s LVOT Vmean:        37.300 cm/s LVOT VTI:          0.097 m LVOT/AV VTI ratio: 0.66  AORTA Ao Root diam: 3.40 cm MITRAL VALVE MV Area (PHT): 5.02 cm    SHUNTS MV Decel Time: 151 msec    Systemic VTI:  0.10 m MV E velocity: 66.00 cm/s  Systemic Diam: 2.30 cm MV A velocity: 51.40 cm/s MV E/A ratio:  1.28 Mihai Croitoru MD Electronically signed by Sanda Klein MD Signature Date/Time: 06/30/2020/9:59:07 AM    Final     Cardiac Studies    Please see echo report above.  EF 45%, probable organized inferoapical thrombus, and normal RV function.  Patient Profile     35 y.o. male with a very early onset CAD and remote anterior STEMI with LAD stent at age 47, presenting with unstable angina, evolved to posterior wall infarction and mild reduction in overall LVEF.  Echocardiogram shows a possible apical left ventricular thrombus, but this appears to be organized possibly calcified.  Assessment & Plan    1. Acute inferoposterior myocardial infarction: Diagnosed by evolution of EKG and significant delta troponin I.  Mild residual ongoing chest discomfort.  Need to move cardiac cath timing up.  Coronary angiography is planned today. 2. Tobacco abuse: Encourage smoking cessation 3. Dyslipidemia: Check LP(a) 4. Acute on chronic combined systolic and diastolic heart failure: No evidence of volume overload at this time.  Will need protective therapy such as beta-blocker, ARB, question SGLT2. 5. LV thrombus: Age is undetermined.  Avoid crossing aortic valve if  possible.  For questions or updates, please contact [place] Please consult [email or web address] for contact info under        Signed, Sinclair Grooms, MD  07/01/2020, 9:29 AM

## 2020-07-01 NOTE — Progress Notes (Signed)
[place] for Heparin Indication: chest pain/ACS, LV apical thrombus  Allergies  Allergen Reactions  . Codeine Itching    Patient Measurements: Height: 6\' 1"  (185.4 cm) Weight: 78.8 kg (173 lb 11.6 oz) IBW/kg (Calculated) : 79.9  Vital Signs: Temp: 98.1 F (36.7 C) (05/02 0731) Temp Source: Oral (05/02 0731) BP: 99/69 (05/02 0943) Pulse Rate: 93 (05/02 0359)  Labs: Recent Labs    06/29/20 0357 06/29/20 0549 06/29/20 1437 06/29/20 2109 06/30/20 0013 06/30/20 0544 07/01/20 0044 07/01/20 0909  HGB 17.1*  --   --   --  14.7  --  14.0  --   HCT 53.5*  --   --   --  45.5  --  42.8  --   PLT 207  --   --   --  178  --  151  --   LABPROT 13.3  --   --   --   --   --  18.8*  --   INR 1.0  --   --   --   --   --  1.6*  --   HEPARINUNFRC  --   --  0.32   < > 0.32  --  0.26* 0.19*  CREATININE 0.96  --   --   --   --   --  0.99  --   TROPONINIHS 74* 366* 25,186*  --   --  >27,000*  --   --    < > = values in this interval not displayed.    Estimated Creatinine Clearance: 113.9 mL/min (by C-G formula based on SCr of 0.99 mg/dL).   Medical History: Past Medical History:  Diagnosis Date  . Atypical chest pain   . CAD (coronary artery disease)   . Dyslipidemia   . Ex-smoker   . History of depression   . Insomnia   . Ischemic cardiomyopathy    Ejection fraction 40-45%  . NSTEMI (non-ST elevated myocardial infarction) ([place]) 08/2007   Treated with a bare metal stent to the proximal LAD  . Suicide attempt ([place]) 01/2001     Assessment: 35 y.o. M presents with CP. Pt recently started warfarin for L apical clot seen on ECHO 4/28. Marland Kitchen Pt does not know what dose of coumadin he was taking. Pharmacy dosing heparin. Plans noted for cath on Monday.    Hgb down 17>14.7 but within normal limits. Plt count normal.   Pharmacy asked to resume IV heparin after cath lab today while awaiting decision on CABG vs. Stent.  Sheath removed at 1247  pm.  Goal of Therapy:  Heparin level 0.3-0.7 units/ml Monitor platelets by anticoagulation protocol: Yes   Plan:  Resume IV heparin at 1300 units/hr at 5 PM tonight. Repeat heparin level 6 hrs after gtt resumes. Daily heparin level and CBC. F/u plans for further cardiac intervention.  [place] Crane, Vena [place], BCPS, BCCP Clinical Pharmacist  07/01/2020 1:23 PM   [facility] pharmacy phone numbers are listed on [email or web address]

## 2020-07-01 NOTE — Progress Notes (Signed)
ANTICOAGULATION CONSULT NOTE - Follow Up Consult  Pharmacy Consult for heparin Indication: ACS and LV thrombus  Labs: Recent Labs    06/29/20 0357 06/29/20 0549 06/29/20 1437 06/29/20 1437 06/29/20 2109 06/30/20 0013 06/30/20 0544 07/01/20 0044  HGB 17.1*  --   --   --   --  14.7  --  14.0  HCT 53.5*  --   --   --   --  45.5  --  42.8  PLT 207  --   --   --   --  178  --  151  LABPROT 13.3  --   --   --   --   --   --  18.8*  INR 1.0  --   --   --   --   --   --  1.6*  HEPARINUNFRC  --   --  0.32   < > 0.37 0.32  --  0.26*  CREATININE 0.96  --   --   --   --   --   --   --   TROPONINIHS 74* 366* 25,186*  --   --   --  >27,000*  --    < > = values in this interval not displayed.    Assessment: 35yo male subtherapeutic on heparin after several levels at low end of goal; no gtt issues or signs of bleeding per RN.  Goal of Therapy:  Heparin level 0.3-0.7 units/ml   Plan:  Will increase heparin gtt by 1-2 units/kg/hr to 1200 units/hr and check level in 6 hours.    Wynona Neat, PharmD, BCPS  07/01/2020,1:27 AM

## 2020-07-01 NOTE — Progress Notes (Signed)
Heart Failure Stewardship Pharmacist Progress Note   PCP: Neale Burly, MD PCP-Cardiologist: No primary care provider on file.    HPI:  35 yo M with early onset CAD - remote STEMI with LAD stent at age 42, and tobacco use. He presented to outpatient cardiology visit on 06/27/20 for stress test and ECHO. 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 He then presented to the ED on 06/29/20 with chest pain and elevated troponin. An ECHO was done at [facility] on 06/29/20 and LVEF read to be 40-45%. LHC done on 07/01/20 and found to have severe 3 vessel disease and occluded LAD at the site of the prior stent with collaterals. Pending CABG vs PCI.   Current HF Medications: Metoprolol XL 12.5 mg daily  Prior to admission HF Medications: Metoprolol XL 25 mg daily  Pertinent Lab Values: . Serum creatinine 0.99, BUN 8, Potassium 4.3, Sodium 137, BNP 129.9  Vital Signs: . Weight: 173 lbs (admission weight: 172 lbs) . Blood pressure: 90/60s  . Heart rate: 100s   Medication Assistance / Insurance Benefits Check: Does the patient have prescription insurance?  No  Does the patient qualify for medication assistance through manufacturers or grants?   Yes . Eligible grants and/or patient assistance programs: pending . Medication assistance applications in progress: none  . Medication assistance applications approved: none Approve medication assistance renewals will be completed by: pending  Outpatient Pharmacy:  Prior to admission outpatient pharmacy: [facility] Is the patient willing to use [facility] pharmacy at discharge? Yes Is the patient willing to transition their outpatient pharmacy to utilize a [facility] outpatient pharmacy?   Pending    Assessment: 1. Acute systolic CHF (EF 40%), due to ICM pending CABG vs PCI. NYHA class II symptoms. - Does not appear volume overloaded on MD exam - no lasix needed -  Continue metoprolol XL 12.5 mg daily - BP remains too soft for Entresto or spironolactone at this time - Can consider starting SGLT2i prior to discharge pending clinical course (CABG vs PCI)   Plan: 1) Medication changes recommended at this time: - Continue current regimen  2) Patient assistance: - Can register for patient assistance for Entresto and SGLT2i once initiated - Will consult HF CSW to help with insurance  3)  Education  - To be completed prior to discharge  Kerby Nora, PharmD, BCPS Heart Failure Cytogeneticist Phone [phone]

## 2020-07-01 NOTE — H&P (View-Only) (Signed)
Progress Note  Patient Name: Wyatt Donovan Date of Encounter: 07/01/2020  [facility] Cardiologist: No primary care provider on file.   Subjective   Still with residual chest tightness, very low-grade.  Improved compared to admission.  Still smokes cigarettes.  Inpatient Medications    Scheduled Meds: . aspirin  81 mg Oral Daily  . atorvastatin  40 mg Oral Daily  . metoprolol succinate  12.5 mg Oral Daily  . sodium chloride flush  3 mL Intravenous Q12H   Continuous Infusions: . sodium chloride    . sodium chloride 10 mL/hr at 07/01/20 0554  . heparin 1,200 Units/hr (07/01/20 0800)  . nitroGLYCERIN 40 mcg/min (07/01/20 0800)   PRN Meds: sodium chloride, acetaminophen, HYDROcodone-acetaminophen, HYDROmorphone (DILAUDID) injection, nitroGLYCERIN, ondansetron (ZOFRAN) IV, sodium chloride flush   Vital Signs    Vitals:   07/01/20 0200 07/01/20 0300 07/01/20 0359 07/01/20 0731  BP: 95/71 106/71 96/63   Pulse: 93 95 93   Resp: 15 17 14    Temp:   98.6 F (37 C) 98.1 F (36.7 C)  TempSrc:   Oral Oral  SpO2: 97% 95% 97%   Weight:      Height:        Intake/Output Summary (Last 24 hours) at 07/01/2020 0929 Last data filed at 07/01/2020 0800 Gross per 24 hour  Intake 1000 ml  Output 2400 ml  Net -1400 ml   Last 3 Weights 06/30/2020 06/30/2020 06/29/2020  Weight (lbs) 173 lb 11.6 oz 175 lb 14.8 oz 172 lb 9.6 oz  Weight (kg) 78.8 kg 79.8 kg 78.291 kg      Telemetry    Normal sinus rhythm with occasional PVCs.- Personally Reviewed  ECG    Performed on Jun 30, 2020, reveals inferoposterior Q wave infarction.  No acute ST-T wave changes.  Compared to the admission EKG, inferior Q waves are deeper and the tall R wave in V1 is new representing evolution of inferoposterior MI.- Personally Reviewed  Physical Exam  Terrible dentition GEN: No acute distress.   Neck: No JVD Cardiac: RRR, no murmurs or rub.  Positive S4 gallop.Marland Kitchen  Respiratory: Clear to auscultation  bilaterally. GI: Soft, nontender, non-distended  MS: No edema; No deformity. Neuro:  Nonfocal  Psych: Normal affect   Labs    High Sensitivity Troponin:   Recent Labs  Lab 06/29/20 0357 06/29/20 0549 06/29/20 1437 06/30/20 0544  TROPONINIHS 74* 366* 25,186* >27,000*      Chemistry Recent Labs  Lab 06/29/20 0357 07/01/20 0044  NA 144 137  K 4.0 4.3  CL 109 103  CO2 24 27  GLUCOSE 103* 114*  BUN 15 8  CREATININE 0.96 0.99  CALCIUM 9.1 8.4*  PROT 6.4*  --   ALBUMIN 3.6  --   AST 36  --   ALT 44  --   ALKPHOS 69  --   BILITOT 0.7  --   GFRNONAA >60 >60  ANIONGAP 11 7     Hematology Recent Labs  Lab 06/29/20 0357 06/30/20 0013 07/01/20 0044  WBC 11.4* 12.3* 17.1*  RBC 5.52 4.73 4.49  HGB 17.1* 14.7 14.0  HCT 53.5* 45.5 42.8  MCV 96.9 96.2 95.3  MCH 31.0 31.1 31.2  MCHC 32.0 32.3 32.7  RDW 13.4 13.3 13.1  PLT 207 178 151    BNP Recent Labs  Lab 06/29/20 0712  BNP 129.9*     DDimer No results for input(s): DDIMER in the last 168 hours.   Radiology  ECHOCARDIOGRAM COMPLETE  Result Date: 06/30/2020    ECHOCARDIOGRAM REPORT   Patient Name:   Wyatt Donovan Date of Exam: 06/29/2020 Medical Rec #:  [id number]        Height:       73.0 in Accession #:    [id number]       Weight:       172.6 lb Date of Birth:  24-Sep-1985        BSA:          2.021 m Patient Age:    35 years         BP:           106/79 mmHg Patient Gender: M                HR:           73 bpm. Exam Location:  Inpatient Procedure: 2D Echo, Cardiac Doppler and Color Doppler Indications:    I25.5 Ischemic cardiomyopathy  History:        Patient has no prior history of Echocardiogram examinations.                 Previous Myocardial Infarction, CAD and Acute MI;                 Signs/Symptoms:Chest Pain. Known clot seen in the apex on echo                 two days prior at outside hospital.  Sonographer:    [place] Referring Phys: [id number] [place]  1. 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 Left ventricular ejection fraction, by estimation, is 40 to 45%. The left ventricle has mildly decreased function. The left ventricle demonstrates regional wall motion abnormalities (see scoring diagram/findings for description). Left ventricular diastolic parameters are consistent with Grade II diastolic dysfunction (pseudonormalization). Elevated left atrial pressure. There is severe hypokinesis of the entire left ventricular inferolateral wall. There is moderate hypokinesis of the left ventricular inferior wall. There is akinesis of a small segment of the apical anterior wall.  2. Right ventricular systolic function is normal. The right ventricular size is normal.  3. Left atrial size was mildly dilated.  4. The mitral valve is normal in structure. Mild mitral valve regurgitation.  5. The aortic valve is normal in structure. Aortic valve regurgitation is not visualized. Comparison(s): No prior Echocardiogram. 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 FINDINGS  Left Ventricle: There is a small (6x7 mm) hyperechogenic thrombus (possibly calcified) at the left ventricular apex, in the area of the akinetic anteroapical segment. It has the appearance of an old organized thrombus. Left ventricular ejection fraction, by estimation, is 40 to 45%. The left ventricle has mildly decreased function. The left ventricle demonstrates regional wall motion abnormalities. Severe hypokinesis of the left ventricular, entire inferolateral wall. Moderate hypokinesis of the left ventricular, entire inferior wall. Severe akinesis of the left ventricular, apical anterior wall. The left  ventricular internal cavity size was normal in size. There is no left ventricular hypertrophy. Left ventricular diastolic parameters are consistent with Grade II diastolic dysfunction (pseudonormalization). Elevated left atrial pressure. Right Ventricle: The right ventricular size is normal. No increase in right ventricular wall  thickness. Right ventricular systolic function is normal. Left Atrium: Left atrial size was mildly dilated. Right Atrium: Right atrial size was normal in size. Pericardium: There is no evidence of pericardial effusion. Mitral Valve: The mitral valve is normal in structure. Mild mitral valve regurgitation, with centrally-directed jet. Tricuspid Valve: The tricuspid valve is normal in structure. Tricuspid valve regurgitation is not demonstrated. Aortic Valve: The aortic valve is normal in structure. Aortic valve regurgitation is not visualized. Aortic valve mean gradient measures 2.0 mmHg. Aortic valve peak gradient measures 2.9 mmHg. Aortic valve area, by VTI measures 2.75 cm. Pulmonic Valve: The pulmonic valve was normal in structure. Pulmonic valve regurgitation is not visualized. Aorta: The aortic root and ascending aorta are structurally normal, with no evidence of dilitation. IAS/Shunts: No atrial level shunt detected by color flow Doppler.  LEFT VENTRICLE PLAX 2D LVIDd:         5.40 cm      Diastology LVIDs:         4.20 cm      LV e' medial:    8.81 cm/s LV PW:         0.90 cm      LV E/e' medial:  7.5 LV IVS:        0.80 cm      LV e' lateral:   8.70 cm/s LVOT diam:     2.30 cm      LV E/e' lateral: 7.6 LV SV:         40 LV SV Index:   20 LVOT Area:     4.15 cm  LV Volumes (MOD) LV vol d, MOD A2C: 115.0 ml LV vol d, MOD A4C: 157.0 ml LV vol s, MOD A2C: 74.1 ml LV vol s, MOD A4C: 76.8 ml LV SV MOD A2C:     40.9 ml LV SV MOD A4C:     157.0 ml LV SV MOD BP:      58.6 ml RIGHT VENTRICLE RV Basal diam:  3.40 cm LEFT ATRIUM             Index       RIGHT ATRIUM           Index LA diam:         3.70 cm 1.83 cm/m  RA Area:     20.20 cm LA Vol (A2C):   67.9 ml 33.60 ml/m RA Volume:   54.00 ml  26.72 ml/m LA Vol (A4C):   49.1 ml 24.29 ml/m LA Biplane Vol: 59.3 ml 29.34 ml/m  AORTIC VALVE AV Area (Vmax):    2.85 cm AV Area (Vmean):   2.73 cm AV Area (VTI):     2.75 cm AV Vmax:           85.70 cm/s AV Vmean:          56.700 cm/s AV VTI:            0.147 m AV Peak Grad:      2.9 mmHg AV Mean Grad:      2.0 mmHg LVOT Vmax:         58.70 cm/s LVOT Vmean:        37.300 cm/s LVOT VTI:          0.097 m LVOT/AV VTI ratio: 0.66  AORTA Ao Root diam: 3.40 cm MITRAL VALVE MV Area (PHT): 5.02 cm    SHUNTS MV Decel Time: 151 msec    Systemic VTI:  0.10 m MV E velocity: 66.00 cm/s  Systemic Diam: 2.30 cm MV A velocity: 51.40 cm/s MV E/A ratio:  1.28 Mihai Croitoru MD Electronically signed by Sanda Klein MD Signature Date/Time: 06/30/2020/9:59:07 AM    Final     Cardiac Studies    Please see echo report above.  EF 45%, probable organized inferoapical thrombus, and normal RV function.  Patient Profile     35 y.o. male with a very early onset CAD and remote anterior STEMI with LAD stent at age 47, presenting with unstable angina, evolved to posterior wall infarction and mild reduction in overall LVEF.  Echocardiogram shows a possible apical left ventricular thrombus, but this appears to be organized possibly calcified.  Assessment & Plan    1. Acute inferoposterior myocardial infarction: Diagnosed by evolution of EKG and significant delta troponin I.  Mild residual ongoing chest discomfort.  Need to move cardiac cath timing up.  Coronary angiography is planned today. 2. Tobacco abuse: Encourage smoking cessation 3. Dyslipidemia: Check LP(a) 4. Acute on chronic combined systolic and diastolic heart failure: No evidence of volume overload at this time.  Will need protective therapy such as beta-blocker, ARB, question SGLT2. 5. LV thrombus: Age is undetermined.  Avoid crossing aortic valve if  possible.  For questions or updates, please contact [place] Please consult [email or web address] for contact info under        Signed, Sinclair Grooms, MD  07/01/2020, 9:29 AM

## 2020-07-01 NOTE — Progress Notes (Signed)
[place] for Heparin Indication: chest pain/ACS, LV apical thrombus  Allergies  Allergen Reactions  . Codeine Itching    Patient Measurements: Height: 6\' 1"  (185.4 cm) Weight: 78.8 kg (173 lb 11.6 oz) IBW/kg (Calculated) : 79.9  Vital Signs: Temp: 98.1 F (36.7 C) (05/02 0731) Temp Source: Oral (05/02 0731) BP: 99/69 (05/02 0943) Pulse Rate: 93 (05/02 0359)  Labs: Recent Labs    06/29/20 0357 06/29/20 0549 06/29/20 1437 06/29/20 2109 06/30/20 0013 06/30/20 0544 07/01/20 0044 07/01/20 0909  HGB 17.1*  --   --   --  14.7  --  14.0  --   HCT 53.5*  --   --   --  45.5  --  42.8  --   PLT 207  --   --   --  178  --  151  --   LABPROT 13.3  --   --   --   --   --  18.8*  --   INR 1.0  --   --   --   --   --  1.6*  --   HEPARINUNFRC  --   --  0.32   < > 0.32  --  0.26* 0.19*  CREATININE 0.96  --   --   --   --   --  0.99  --   TROPONINIHS 74* 366* 25,186*  --   --  >27,000*  --   --    < > = values in this interval not displayed.    Estimated Creatinine Clearance: 113.9 mL/min (by C-G formula based on SCr of 0.99 mg/dL).   Medical History: Past Medical History:  Diagnosis Date  . Atypical chest pain   . CAD (coronary artery disease)   . Dyslipidemia   . Ex-smoker   . History of depression   . Insomnia   . Ischemic cardiomyopathy    Ejection fraction 40-45%  . NSTEMI (non-ST elevated myocardial infarction) ([place]) 08/2007   Treated with a bare metal stent to the proximal LAD  . Suicide attempt ([place]) 01/2001     Assessment: 35 y.o. M presents with CP. Pt recently started warfarin for L apical clot seen on ECHO 4/28. Marland Kitchen Pt does not know what dose of coumadin he was taking. Pharmacy dosing heparin. Plans noted for cath on Monday.   Heparin level 0.19 less than goal depite rate increased 1200 uts/hr earlier this am   Hgb down 17>14.7 but within normal limits. Plt count normal.  Plan cath lab soon   Goal of Therapy:   Heparin level 0.3-0.7 units/ml Monitor platelets by anticoagulation protocol: Yes   Plan:  Increase heparin at 1300 units/hr -Daily heparin level and CBC   Bonnita Nasuti Pharm.D. CPP, BCPS Clinical Pharmacist [phone] 07/01/2020 11:46 AM    **Pharmacist phone directory can now be found on [email or web address] ([email or web address]).  Listed under [place].

## 2020-07-01 NOTE — Progress Notes (Signed)
Back from cath lab by bed awake and alert.TR band to right wrist intact elevated with pillow, pulse ox to right  Thumb instructed to avoid moving affected arm.

## 2020-07-01 NOTE — Interval H&P Note (Signed)
Cath Lab Visit (complete for each Cath Lab visit)  Clinical Evaluation Leading to the Procedure:   ACS: Yes.    Non-ACS:    Anginal Classification: CCS III  Anti-ischemic medical therapy: Minimal Therapy (1 class of medications)  Non-Invasive Test Results: No non-invasive testing performed  Prior CABG: No previous CABG      History and Physical Interval Note:  07/01/2020 12:02 PM  Wyatt Donovan  has presented today for surgery, with the diagnosis of NSTEMI.  The various methods of treatment have been discussed with the patient and family. After consideration of risks, benefits and other options for treatment, the patient has consented to  Procedure(s): LEFT HEART CATH AND CORONARY ANGIOGRAPHY (N/A) as a surgical intervention.  The patient's history has been reviewed, patient examined, no change in status, stable for surgery.  I have reviewed the patient's chart and labs.  Questions were answered to the patient's satisfaction.     Quay Burow

## 2020-07-01 NOTE — Progress Notes (Signed)
Heart Failure Nurse Navigator Progress Note  Potential to screen patient for HV TOC readiness as previous hx of HF. Will continue to watch hospitalization to assess if admission related to HF. Pt currently not appearing overloaded/no IV lasix ordered at this time.   Pricilla Holm, RN, BSN Heart Failure Nurse Navigator [phone]

## 2020-07-01 NOTE — Progress Notes (Signed)
Complained of numbness on the right hand. Started deflating air from TR  Band about 78ml out of 12.. but still with same problem. Affected arm is warm to touch + radial pulse, allen's test is +. Sheryl from cath lab. came to assess site.Instructed pt to put down right arm to help with blood circulation. MD to be notified by the latter. Continue to monitor.

## 2020-07-02 ENCOUNTER — Inpatient Hospital Stay (HOSPITAL_COMMUNITY): Payer: Medicaid Other

## 2020-07-02 ENCOUNTER — Encounter (HOSPITAL_COMMUNITY): Payer: Self-pay | Admitting: Internal Medicine

## 2020-07-02 DIAGNOSIS — I25750 Atherosclerosis of native coronary artery of transplanted heart with unstable angina: Secondary | ICD-10-CM

## 2020-07-02 DIAGNOSIS — Z01810 Encounter for preprocedural cardiovascular examination: Secondary | ICD-10-CM

## 2020-07-02 DIAGNOSIS — I509 Heart failure, unspecified: Secondary | ICD-10-CM

## 2020-07-02 DIAGNOSIS — R079 Chest pain, unspecified: Secondary | ICD-10-CM

## 2020-07-02 DIAGNOSIS — I5022 Chronic systolic (congestive) heart failure: Secondary | ICD-10-CM

## 2020-07-02 DIAGNOSIS — I25110 Atherosclerotic heart disease of native coronary artery with unstable angina pectoris: Secondary | ICD-10-CM

## 2020-07-02 LAB — BASIC METABOLIC PANEL WITH GFR
Anion gap: 7 (ref 5–15)
BUN: 6 mg/dL (ref 6–20)
CO2: 29 mmol/L (ref 22–32)
Calcium: 8.5 mg/dL — ABNORMAL LOW (ref 8.9–10.3)
Chloride: 103 mmol/L (ref 98–111)
Creatinine, Ser: 0.89 mg/dL (ref 0.61–1.24)
GFR, Estimated: >60 mL/min
Glucose, Bld: 94 mg/dL (ref 70–99)
Potassium: 4.6 mmol/L (ref 3.5–5.1)
Sodium: 139 mmol/L (ref 135–145)

## 2020-07-02 LAB — URINALYSIS, ROUTINE W REFLEX MICROSCOPIC
Bilirubin Urine: NEGATIVE
Glucose, UA: NEGATIVE mg/dL
Hgb urine dipstick: NEGATIVE
Ketones, ur: NEGATIVE mg/dL
Leukocytes,Ua: NEGATIVE
Nitrite: NEGATIVE
Protein, ur: NEGATIVE mg/dL
Specific Gravity, Urine: 1.02 (ref 1.005–1.030)
pH: 5 (ref 5.0–8.0)

## 2020-07-02 LAB — HEPARIN LEVEL (UNFRACTIONATED)
Heparin Unfractionated: 0.18 [IU]/mL — ABNORMAL LOW (ref 0.30–0.70)
Heparin Unfractionated: 0.29 [IU]/mL — ABNORMAL LOW (ref 0.30–0.70)

## 2020-07-02 LAB — CBC
HCT: 44.2 % (ref 39.0–52.0)
Hemoglobin: 14.4 g/dL (ref 13.0–17.0)
MCH: 31.1 pg (ref 26.0–34.0)
MCHC: 32.6 g/dL (ref 30.0–36.0)
MCV: 95.5 fL (ref 80.0–100.0)
Platelets: 151 10*3/uL (ref 150–400)
RBC: 4.63 MIL/uL (ref 4.22–5.81)
RDW: 13.3 % (ref 11.5–15.5)
WBC: 11.8 10*3/uL — ABNORMAL HIGH (ref 4.0–10.5)
nRBC: 0 % (ref 0.0–0.2)

## 2020-07-02 LAB — SURGICAL PCR SCREEN
MRSA, PCR: NEGATIVE
Staphylococcus aureus: NEGATIVE

## 2020-07-02 LAB — PREPARE RBC (CROSSMATCH)

## 2020-07-02 LAB — PROTIME-INR
INR: 1.4 — ABNORMAL HIGH (ref 0.8–1.2)
Prothrombin Time: 16.8 seconds — ABNORMAL HIGH (ref 11.4–15.2)

## 2020-07-02 LAB — LIPOPROTEIN A (LPA): Lipoprotein (a): 112.5 nmol/L — ABNORMAL HIGH (ref <75.0)

## 2020-07-02 LAB — ABO/RH: ABO/RH(D): O POS

## 2020-07-02 IMAGING — MR MR CARD MORPHOLOGY WO/W CM
45 of 48 series · 45 of 48 positions shown · IV contrast (Contrast agent)
Comparison: none

CLINICAL DATA: Evaluate for viability

EXAM:
CARDIAC MRI
TECHNIQUE: The patient was scanned on a 1.5 Tesla Siemens magnet. A dedicated
cardiac coil was used. Functional imaging was done using Fiesta
sequences. [DATE], and 4 chamber views were done to assess for RWMA's.
Modified KEYLI rule using a short axis stack was used to
calculate an ejection fraction on a dedicated work station using
Circle software. The patient received 8 cc of Gadavist. After 10
minutes inversion recovery sequences were used to assess for
infiltration and scar tissue.
CONTRAST:  8 cc  of Gadavist

[Series 4: [id number] · axial · 8.0mm · 1.41mm/px · 1 of 18 slices shown]
[im 1/18]
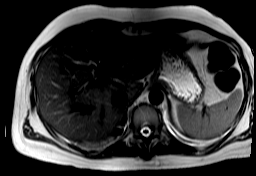

[Series 8: bSSFP · oblique · 8.0mm · 1.61mm/px · 1 of 25 slices shown (1 of 23)]
[im 1/25]
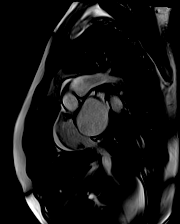

[Series 9: bSSFP · oblique · 8.0mm · 1.61mm/px · 1 of 25 slices shown (2 of 23)]
[im 1/25]
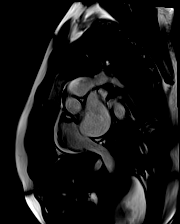

[Series 10: bSSFP · oblique · 8.0mm · 1.61mm/px · 1 of 25 slices shown (3 of 23)]
[im 1/25]
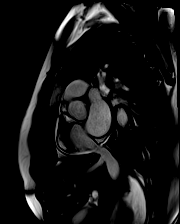

[Series 11: bSSFP · oblique · 8.0mm · 1.61mm/px · 1 of 25 slices shown (4 of 23)]
[im 1/25]
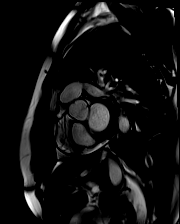

[Series 12: bSSFP · oblique · 8.0mm · 1.61mm/px · 1 of 25 slices shown (5 of 23)]
[im 1/25]
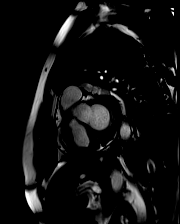

[Series 13: bSSFP · oblique · 8.0mm · 1.61mm/px · 1 of 25 slices shown (6 of 23)]
[im 1/25]
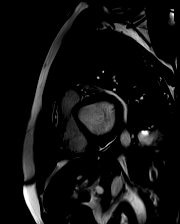

[Series 14: bSSFP · oblique · 8.0mm · 1.61mm/px · 1 of 25 slices shown (7 of 23)]
[im 1/25]
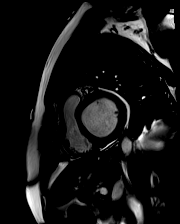

[Series 15: bSSFP · oblique · 8.0mm · 1.61mm/px · 1 of 25 slices shown (8 of 23)]
[im 1/25]
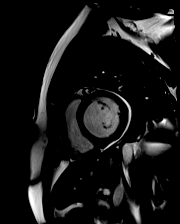

[Series 16: bSSFP · oblique · 8.0mm · 1.61mm/px · 1 of 25 slices shown (9 of 23)]
[im 1/25]
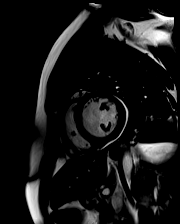

[Series 17: bSSFP · oblique · 8.0mm · 1.61mm/px · 1 of 25 slices shown (10 of 23)]
[im 1/25]
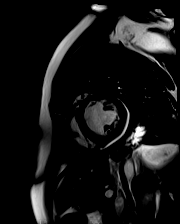

[Series 18: bSSFP · oblique · 8.0mm · 1.61mm/px · 1 of 25 slices shown (11 of 23)]
[im 1/25]
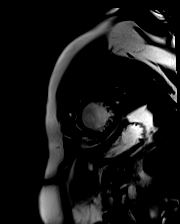

[Series 19: bSSFP · oblique · 8.0mm · 1.61mm/px · 1 of 25 slices shown (12 of 23)]
[im 1/25]
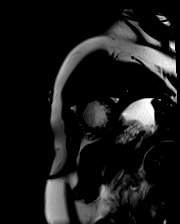

[Series 20: bSSFP · oblique · 8.0mm · 1.61mm/px · 1 of 25 slices shown (13 of 23)]
[im 1/25]
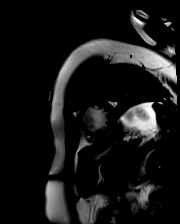

[Series 21: bSSFP · oblique · 8.0mm · 1.61mm/px · 1 of 25 slices shown (14 of 23)]
[im 1/25]
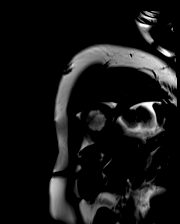

[Series 22: bSSFP · oblique · 8.0mm · 1.61mm/px · 1 of 25 slices shown (15 of 23)]
[im 1/25]
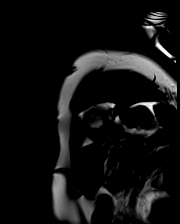

[Series 23: bSSFP · oblique · 8.0mm · 1.61mm/px · 1 of 25 slices shown (16 of 23)]
[im 1/25]
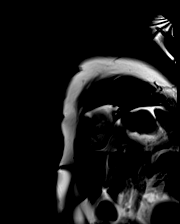

[Series 24: bSSFP · oblique · 8.0mm · 1.61mm/px · 1 of 25 slices shown (17 of 23)]
[im 1/25]
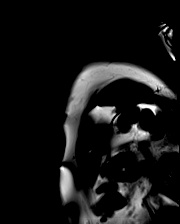

[Series 25: bSSFP · oblique · 6.0mm · 1.41mm/px · 1 of 25 slices shown (18 of 23)]
[im 1/25]
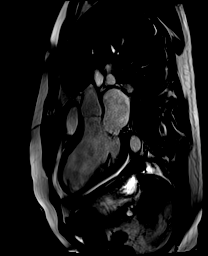

[Series 26: bSSFP · oblique · 6.0mm · 1.41mm/px · 1 of 25 slices shown (19 of 23)]
[im 1/25]
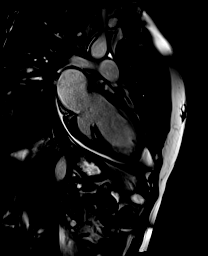

[Series 27: bSSFP · oblique · 6.0mm · 1.41mm/px · 1 of 25 slices shown (20 of 23)]
[im 1/25]
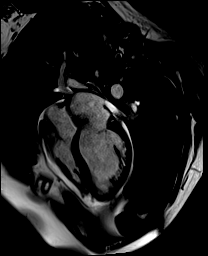

[Series 28: (id)_long_t1 · oblique · 8.0mm · 2.08mm/px · 1 of 24 slices shown]
[im 1/24]
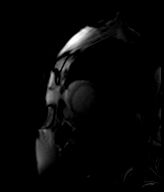

[Series 29: (id)_long_t1_moco · oblique · 8.0mm · 2.08mm/px · 1 of 24 slices shown]
[im 1/24]
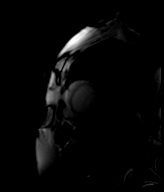

[Series 32: (id)_trufi · oblique · 8.0mm · 2.08mm/px · 1 of 9 slices shown]
[im 1/9]
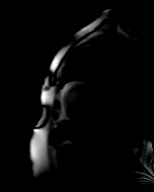

[Series 33: (id)_trufi_moco · oblique · 8.0mm · 2.08mm/px · 1 of 9 slices shown]
[im 1/9]
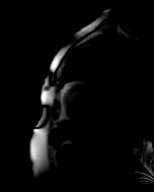

[Series 36: STIR · oblique · 8.0mm · 1.92mm/px · 1 of 17 slices shown]
[im 1/17]
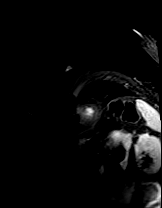

[Series 37: pre short axis · oblique · non-contrast · 8.0mm · 2.25mm/px · 1 of 10 slices shown (1 of 6)]
[im 1/10]
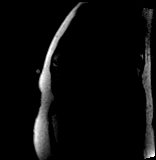

[Series 38: pre short axis · oblique · non-contrast · 8.0mm · 2.25mm/px · 1 of 10 slices shown (2 of 6)]
[im 1/10]
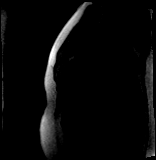

[Series 39: pre short axis · oblique · non-contrast · 8.0mm · 2.25mm/px · 1 of 10 slices shown (3 of 6)]
[im 1/10]
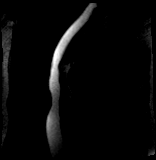

[Series 40: pre short axis · oblique · non-contrast · 8.0mm · 2.25mm/px · 1 of 10 slices shown (4 of 6)]
[im 1/10]
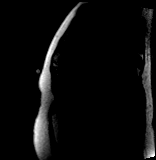

[Series 41: pre short axis · oblique · non-contrast · 8.0mm · 2.25mm/px · 1 of 10 slices shown (5 of 6)]
[im 1/10]
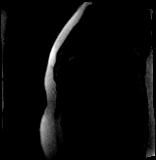

[Series 42: pre short axis · oblique · non-contrast · 8.0mm · 2.25mm/px · 1 of 10 slices shown (6 of 6)]
[im 1/10]
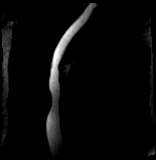

[Series 43: rest short axis · oblique · 8.0mm · 2.25mm/px · 1 of 60 slices shown (1 of 6)]
[im 1/60]
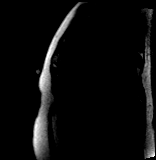

[Series 44: rest short axis · oblique · 8.0mm · 2.25mm/px · 1 of 60 slices shown (2 of 6)]
[im 1/60]
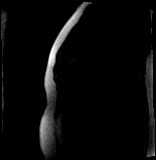

[Series 45: rest short axis · oblique · 8.0mm · 2.25mm/px · 1 of 60 slices shown (3 of 6)]
[im 1/60]
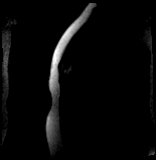

[Series 46: rest short axis · oblique · 8.0mm · 2.25mm/px · 1 of 60 slices shown (4 of 6)]
[im 1/60]
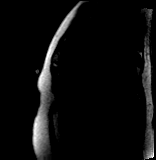

[Series 47: rest short axis · oblique · 8.0mm · 2.25mm/px · 1 of 60 slices shown (5 of 6)]
[im 1/60]
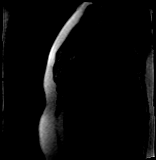

[Series 48: rest short axis · oblique · 8.0mm · 2.25mm/px · 1 of 60 slices shown (6 of 6)]
[im 1/60]
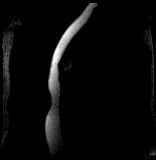

[Series 49: bSSFP · oblique · 8.0mm · 1.73mm/px · 1 of 17 slices shown (21 of 23)]
[im 1/17]
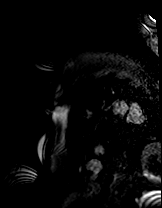

[Series 50: bSSFP · oblique · 8.0mm · 1.73mm/px · 1 of 17 slices shown (22 of 23)]
[im 1/17]
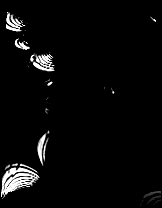

[Series 51: bSSFP · coronal · 6.0mm · 1.41mm/px · 1 of 25 slices shown (23 of 23)]
[im 1/25]
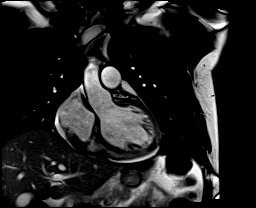

[Series 52: cine rvot · sagittal · 6.0mm · 1.41mm/px · 1 of 25 slices shown]
[im 1/25]
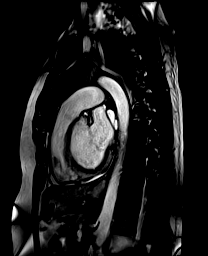

[Series 53: aortic valve cine · axial · 6.0mm · 1.41mm/px · 1 of 25 slices shown]
[im 1/25]
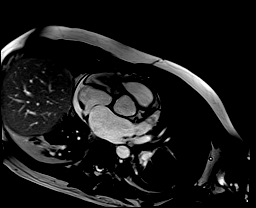

[Series 54: cine rvit · coronal · 6.0mm · 1.41mm/px · 1 of 25 slices shown]
[im 1/25]
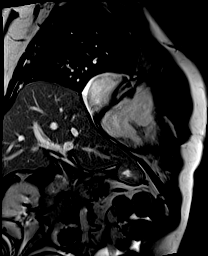

[Series 56: lge_single shot sa · oblique · 8.0mm · 2.08mm/px · 1 of 17 slices shown]
[im 1/17]
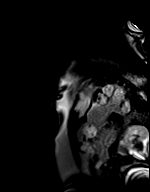

[45 of 48 positions shown; findings below may reference images not displayed]

FINDINGS: Left ventricle:

-There was one mid ventricular short axis slice that was omitted
from the short axis cine stack, which can affect calculation of
volumes

-Mild dilatation

-Severe systolic dysfunction. Akinesis of basal to mid
inferior/inferoseptal/inferolateral, mid anterolateral, and apical
inferior/septal/lateral walls.

-LV apical thrombus measuring 6mm x 5mm

-Transmural LGE with areas of microvascular obstruction vs
intramyocardial hemorrhage consistent with acute MI in the basal to
mid inferoseptal/inferior/inferolateral walls and apical lateral
walls.

-Subendocardial LGE consistent with prior infarct in mid
anteroseptal, apical septal/inferior walls and apex. LGE>50%
transmural only at distal apex

LV EF: 27% (Normal 56-78%)

Absolute volumes:

LV EDV: 227mL (Normal 77-195 mL)

LV ESV: 165mL (Normal 19-72 mL)

LV SV: 62mL (Normal 51-133 mL)

CO: 5.5L/min (Normal 2.8-8.8 L/min)

Indexed volumes:

LV EDV: 112mL/sq-m (Normal 47-92 mL/sq-m)

LV ESV: 81mL/sq-m (Normal 13-30 mL/sq-m)

LV SV: 31mL/sq-m (Normal 32-62 mL/sq-m)

CI: 2.7L/min/sq-m (Normal 1.7-4.2 L/min/sq-m)

Right ventricle: Normal size and systolic function

RV EF:  57% (Normal 47-74%)

Absolute volumes:

RV EDV: 119mL (Normal 88-227 mL)

RV ESV: 51mL (Normal 23-103 mL)

RV SV: 68mL (Normal 52-138 mL)

CO: 6.1L/min (Normal 2.8-8.8 L/min)

Indexed volumes:

RV EDV: 59mL/sq-m (Normal 55-105 mL/sq-m)

RV ESV: 25mL/sq-m (Normal 15-43 mL/sq-m)

RV SV: 34mL/sq-m (Normal 32-64 mL/sq-m)

CI: 3.0L/min/sq-m (Normal 1.7-4.2 L/min/sq-m)

Left atrium: Normal size

Right atrium: Normal size

Mitral valve: Mild regurgitation

Aortic valve: Tricuspid.  No regurgitation

Tricuspid valve: No regurgitation

Pulmonic valve: No regurgitation

Aorta: Normal proximal ascending aorta

Pericardium: Small effusion
IMPRESSION: 1. Acute MI in the in the basal to mid
inferoseptal/inferior/inferolateral walls and apical lateral wall,
with transmural LGE and areas of microvascular obstruction vs
intramyocardial hemorrhage. Unable to assess this territory for
viability in setting of acute MI

2. LAD territory appears viable. There is subendocardial LGE
consistent with prior infarct in mid anteroseptal, apical
septal/inferior walls and apex. LGE is greater than 50% transmural
suggesting nonviability only at the very distal apex.

3.  LV apical thrombus measures 6mm x 5mm

4. Mild LV dilatation with severe systolic dysfunction (EF 27%).
Akinesis of basal to mid inferior/inferoseptal/inferolateral, mid
anterolateral, and apical inferior/septal/lateral walls.

5.  Normal RV size and systolic function (EF 57%)

## 2020-07-02 MED ORDER — CHLORHEXIDINE GLUCONATE CLOTH 2 % EX PADS
6.0000 | MEDICATED_PAD | Freq: Once | CUTANEOUS | Status: AC
  Administered 2020-07-03: 6 via TOPICAL

## 2020-07-02 MED ORDER — NITROGLYCERIN IN D5W 200-5 MCG/ML-% IV SOLN
2.0000 ug/min | INTRAVENOUS | Status: AC
  Administered 2020-07-03: 3 ug/min via INTRAVENOUS
  Filled 2020-07-02: qty 250

## 2020-07-02 MED ORDER — TEMAZEPAM 15 MG PO CAPS
15.0000 mg | ORAL_CAPSULE | Freq: Once | ORAL | Status: AC | PRN
  Administered 2020-07-02: 15 mg via ORAL
  Filled 2020-07-02: qty 1

## 2020-07-02 MED ORDER — BISACODYL 5 MG PO TBEC
5.0000 mg | DELAYED_RELEASE_TABLET | Freq: Once | ORAL | Status: AC
  Administered 2020-07-02: 5 mg via ORAL
  Filled 2020-07-02: qty 1

## 2020-07-02 MED ORDER — CHLORHEXIDINE GLUCONATE CLOTH 2 % EX PADS
6.0000 | MEDICATED_PAD | Freq: Once | CUTANEOUS | Status: AC
  Administered 2020-07-02: 6 via TOPICAL

## 2020-07-02 MED ORDER — CHLORHEXIDINE GLUCONATE 0.12 % MT SOLN
15.0000 mL | Freq: Once | OROMUCOSAL | Status: AC
  Administered 2020-07-03: 15 mL via OROMUCOSAL
  Filled 2020-07-02: qty 15

## 2020-07-02 MED ORDER — TRANEXAMIC ACID (OHS) PUMP PRIME SOLUTION
2.0000 mg/kg | INTRAVENOUS | Status: DC
  Filled 2020-07-02: qty 1.6

## 2020-07-02 MED ORDER — METOPROLOL TARTRATE 12.5 MG HALF TABLET
12.5000 mg | ORAL_TABLET | Freq: Once | ORAL | Status: AC
  Administered 2020-07-03: 12.5 mg via ORAL
  Filled 2020-07-02: qty 1

## 2020-07-02 MED ORDER — TRANEXAMIC ACID (OHS) BOLUS VIA INFUSION
15.0000 mg/kg | INTRAVENOUS | Status: AC
  Administered 2020-07-03: 1200 mg via INTRAVENOUS
  Filled 2020-07-02: qty 1200

## 2020-07-02 MED ORDER — SODIUM CHLORIDE 0.9 % IV SOLN
INTRAVENOUS | Status: DC
  Filled 2020-07-02: qty 30

## 2020-07-02 MED ORDER — POTASSIUM CHLORIDE 2 MEQ/ML IV SOLN
80.0000 meq | INTRAVENOUS | Status: DC
  Filled 2020-07-02: qty 40

## 2020-07-02 MED ORDER — EPINEPHRINE HCL 5 MG/250ML IV SOLN IN NS
0.0000 ug/min | INTRAVENOUS | Status: DC
  Filled 2020-07-02: qty 250

## 2020-07-02 MED ORDER — CEFAZOLIN SODIUM-DEXTROSE 2-4 GM/100ML-% IV SOLN
2.0000 g | INTRAVENOUS | Status: DC
  Filled 2020-07-02: qty 100

## 2020-07-02 MED ORDER — NOREPINEPHRINE 4 MG/250ML-% IV SOLN
0.0000 ug/min | INTRAVENOUS | Status: DC
  Filled 2020-07-02: qty 250

## 2020-07-02 MED ORDER — VANCOMYCIN HCL 1250 MG/250ML IV SOLN
1250.0000 mg | INTRAVENOUS | Status: AC
  Administered 2020-07-03: 1250 mg via INTRAVENOUS
  Filled 2020-07-02: qty 250

## 2020-07-02 MED ORDER — DEXMEDETOMIDINE HCL IN NACL 400 MCG/100ML IV SOLN
0.1000 ug/kg/h | INTRAVENOUS | Status: AC
Start: 2020-07-03 — End: 2020-07-04
  Administered 2020-07-03: .7 ug/kg/h via INTRAVENOUS
  Filled 2020-07-02: qty 100

## 2020-07-02 MED ORDER — TRANEXAMIC ACID 1000 MG/10ML IV SOLN
1.5000 mg/kg/h | INTRAVENOUS | Status: AC
  Administered 2020-07-03: 1.5 mg/kg/h via INTRAVENOUS
  Filled 2020-07-02: qty 25

## 2020-07-02 MED ORDER — PLASMA-LYTE 148 IV SOLN
INTRAVENOUS | Status: DC
  Filled 2020-07-02: qty 2.5

## 2020-07-02 MED ORDER — PHENYLEPHRINE HCL-NACL 20-0.9 MG/250ML-% IV SOLN
30.0000 ug/min | INTRAVENOUS | Status: AC
Start: 2020-07-03 — End: 2020-07-03
  Administered 2020-07-03: 25 ug/min via INTRAVENOUS
  Filled 2020-07-02: qty 250

## 2020-07-02 MED ORDER — MANNITOL 20 % IV SOLN
Freq: Once | INTRAVENOUS | Status: DC
  Filled 2020-07-02: qty 13

## 2020-07-02 MED ORDER — MILRINONE LACTATE IN DEXTROSE 20-5 MG/100ML-% IV SOLN
0.3000 ug/kg/min | INTRAVENOUS | Status: DC
  Filled 2020-07-02: qty 100

## 2020-07-02 MED ORDER — CEFAZOLIN SODIUM-DEXTROSE 2-4 GM/100ML-% IV SOLN
2.0000 g | INTRAVENOUS | Status: AC
  Administered 2020-07-03 (×2): 2 g via INTRAVENOUS
  Filled 2020-07-02: qty 100

## 2020-07-02 MED ORDER — INSULIN REGULAR(HUMAN) IN NACL 100-0.9 UT/100ML-% IV SOLN
INTRAVENOUS | Status: AC
  Administered 2020-07-03: 1.2 [IU]/h via INTRAVENOUS
  Filled 2020-07-02: qty 100

## 2020-07-02 MED ORDER — GADOBUTROL 1 MMOL/ML IV SOLN
8.0000 mL | Freq: Once | INTRAVENOUS | Status: AC | PRN
  Administered 2020-07-02: 8 mL via INTRAVENOUS

## 2020-07-02 NOTE — Progress Notes (Signed)
Progress Note  Patient Name: Wyatt Donovan Date of Encounter: 07/02/2020  [place] Cardiologist: None   Subjective   Has not had chest pain since before cath.  Had MRI this morning.  Inpatient Medications    Scheduled Meds: . aspirin  81 mg Oral Daily  . atorvastatin  40 mg Oral Daily  . metoprolol succinate  12.5 mg Oral Daily  . sodium chloride flush  3 mL Intravenous Q12H  . sodium chloride flush  3 mL Intravenous Q12H   Continuous Infusions: . sodium chloride    . heparin 1,500 Units/hr (07/02/20 0858)  . nitroGLYCERIN 10 mcg/min (07/02/20 0800)   PRN Meds: sodium chloride, acetaminophen, HYDROcodone-acetaminophen, HYDROmorphone (DILAUDID) injection, nitroGLYCERIN, ondansetron (ZOFRAN) IV, sodium chloride flush   Vital Signs    Vitals:   07/01/20 2300 07/02/20 0300 07/02/20 0830 07/02/20 0856  BP: (!) 98/59 101/75  104/73  Pulse: 93 (!) 107  98  Resp: 17 16    Temp: 98.2 F (36.8 C) 98.4 F (36.9 C)    TempSrc: Oral Oral    SpO2: 92% 95% 94%   Weight:  80 kg    Height:        Intake/Output Summary (Last 24 hours) at 07/02/2020 0928 Last data filed at 07/02/2020 0800 Gross per 24 hour  Intake 602.75 ml  Output 1275 ml  Net -672.25 ml   Last 3 Weights 07/02/2020 06/30/2020 06/30/2020  Weight (lbs) 176 lb 5.9 oz 173 lb 11.6 oz 175 lb 14.8 oz  Weight (kg) 80 kg 78.8 kg 79.8 kg      Telemetry    Normal sinus rhythm with occasional PVCs.- Personally Reviewed  ECG    Performed on Jun 30, 2020, reveals inferoposterior Q wave infarction.  No acute ST-T wave changes.  Compared to the admission EKG, inferior Q waves are deeper and the tall R wave in V1 is new representing evolution of inferoposterior MI.- Personally Reviewed  Physical Exam  Terrible dentition GEN: No acute distress.   Neck: No JVD Cardiac:  Cath site is unremarkable.  RRR, no murmurs or rub.  Positive S4 gallop.Marland Kitchen  Respiratory: Clear to auscultation bilaterally. GI: Soft, nontender,  non-distended  MS: No edema; No deformity. Neuro:  Nonfocal  Psych: Normal affect   Labs    High Sensitivity Troponin:   Recent Labs  Lab 06/29/20 0357 06/29/20 0549 06/29/20 1437 06/30/20 0544  TROPONINIHS 74* 366* 25,186* >27,000*      Chemistry Recent Labs  Lab 06/29/20 0357 07/01/20 0044 07/02/20 0838  NA 144 137 139  K 4.0 4.3 4.6  CL 109 103 103  CO2 24 27 29   GLUCOSE 103* 114* 94  BUN 15 8 6   CREATININE 0.96 0.99 0.89  CALCIUM 9.1 8.4* 8.5*  PROT 6.4*  --   --   ALBUMIN 3.6  --   --   AST 36  --   --   ALT 44  --   --   ALKPHOS 69  --   --   BILITOT 0.7  --   --   GFRNONAA >60 >60 >60  ANIONGAP 11 7 7      Hematology Recent Labs  Lab 06/30/20 0013 07/01/20 0044 07/02/20 0838  WBC 12.3* 17.1* 11.8*  RBC 4.73 4.49 4.63  HGB 14.7 14.0 14.4  HCT 45.5 42.8 44.2  MCV 96.2 95.3 95.5  MCH 31.1 31.2 31.1  MCHC 32.3 32.7 32.6  RDW 13.3 13.1 13.3  PLT 178 151 151  BNP Recent Labs  Lab 06/29/20 0712  BNP 129.9*     DDimer No results for input(s): DDIMER in the last 168 hours.   Radiology    CARDIAC CATHETERIZATION  Result Date: 07/01/2020  Prox LAD to Mid LAD lesion is 100% stenosed.  Ost RCA to Prox RCA lesion is 100% stenosed.  Prox Cx to Mid Cx lesion is 95% stenosed.  AULTON ROUTT is a 35 y.o. male  [id number] LOCATION:  FACILITY: [facility] PHYSICIAN: Quay Burow, M.D. 1985/10/07 DATE OF PROCEDURE:  07/01/2020 DATE OF DISCHARGE: CARDIAC CATHETERIZATION History obtained from chart review.35 y.o. male with a very early onset CAD and remote anterior STEMI with LAD stent at age 19, presenting with unstable angina, evolved to posterior wall infarction and mild reduction in overall LVEF.Echocardiogram shows a possible apical left ventricular thrombus, but this appears to be organized possibly calcified.   [name] has three-vessel disease.  He has an occluded LAD at the site of prior stenting with left to left and right to left collaterals as  well as an occluded codominant RCA with right to LAD collaterals.  He has a 95% stenosis in the mid AV groove circumflex which is a codominant vessel just prior to the takeoff of a moderate-sized marginal branch.  LVEDP is not elevated.  He is relatively hypotensive in the Cath Lab and did receive a fluid bolus.  After review with his attending, Dr. Tamala Julian, it was decided to take a step back, to viability of the anterior wall, and consider CABG if his anterior wall is viable.  If not, he will need stenting of his mid AV groove circumflex.  Sheath was removed and a TR band was placed on the right wrist to achieve patent hemostasis.  The patient left lab in stable condition. Quay Burow. MD, [facility] 07/01/2020 12:55 PM    Cardiac Studies    Please see echo report above.  EF 45%, probable organized inferoapical thrombus, and normal RV function.  MRI report is pending  Cardiac cath 07/01/2020: Diagnostic Dominance: Co-dominant     Patient Profile     35 y.o. male with a very early onset CAD and remote anterior STEMI with LAD stent at age 13, presenting with unstable angina, evolved to posterior wall infarction and mild reduction in overall LVEF.  Echocardiogram shows a possible apical left ventricular thrombus, but this appears to be organized possibly calcified.  Assessment & Plan    1. Acute inferoposterior myocardial infarction: Angiography demonstrated severe three-vessel coronary disease with chronic total occlusion of RCA and LAD.  Tight proximal circumflex stenosis noted.  PCI of the circumflex is an option however given overall anatomy, social issues, and compliance problems over time, coronary bypass grafting with arterial conduits would probably be his best long-term option.  We will get surgical opinion assuming there is viable myocardium.  The STITCH trial would suggest he would be better over the long haul with CABG. 2. Tobacco abuse: Encourage smoking cessation 3. Dyslipidemia: Check  LP(a) 4. Acute on chronic combined systolic and diastolic heart failure: No evidence of volume overload at this time.  Will need protective therapy such as beta-blocker, ARB, question SGLT2.  Viability documentation with MRI.  Report is pending. 5. LV thrombus: Age is undetermined.  Avoid crossing aortic valve if possible.  We will get a surgical opinion concerning surgery as a treatment option.  If not felt to be an appropriate candidate, fallback option will be PCI to circumflex.  For questions or updates, please  contact [place] Please consult [email or web address] for contact info under        Signed, Sinclair Grooms, MD  07/02/2020, 9:28 AM

## 2020-07-02 NOTE — Anesthesia Preprocedure Evaluation (Addendum)
Anesthesia Evaluation  Patient identified by MRN, date of birth, ID band Patient awake    Reviewed: Allergy & Precautions, NPO status , Patient's Chart, lab work & pertinent test results  Airway Mallampati: II  TM Distance: >3 FB     Dental   Pulmonary Current Smoker and Patient abstained from smoking.,    breath sounds clear to auscultation       Cardiovascular + CAD and + Past MI   Rhythm:Regular Rate:Normal  History noted CG   Neuro/Psych    GI/Hepatic negative GI ROS, Neg liver ROS,   Endo/Other  negative endocrine ROS  Renal/GU negative Renal ROS     Musculoskeletal   Abdominal   Peds  Hematology   Anesthesia Other Findings   Reproductive/Obstetrics                            Anesthesia Physical Anesthesia Plan  ASA: III  Anesthesia Plan: General   Post-op Pain Management:    Induction: Intravenous  PONV Risk Score and Plan: Midazolam and Dexamethasone  Airway Management Planned: Oral ETT  Additional Equipment: Arterial line, PA Cath, TEE and Ultrasound Guidance Line Placement  Intra-op Plan:   Post-operative Plan: Post-operative intubation/ventilation  Informed Consent: I have reviewed the patients History and Physical, chart, labs and discussed the procedure including the risks, benefits and alternatives for the proposed anesthesia with the patient or authorized representative who has indicated his/her understanding and acceptance.     Dental advisory given  Plan Discussed with: Anesthesiologist and CRNA  Anesthesia Plan Comments:       Anesthesia Quick Evaluation

## 2020-07-02 NOTE — Progress Notes (Signed)
Picked -up from MRI by bed awake and alert.

## 2020-07-02 NOTE — Progress Notes (Signed)
As per schedule , pt is scheduled for CABG tom. at 835 am but no pre- op order. Spoke with Dr. Cyndia Bent  who claimed to check on it.

## 2020-07-02 NOTE — Plan of Care (Signed)

## 2020-07-02 NOTE — Progress Notes (Signed)
Heart Failure Stewardship Pharmacist Progress Note   PCP: Neale Burly, MD PCP-Cardiologist: None    HPI:  35 yo M with early onset CAD - remote STEMI with LAD stent at age 70, and tobacco use. He presented to outpatient cardiology visit on 06/27/20 for stress test and ECHO. 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 He then presented to the ED on 06/29/20 with chest pain and elevated troponin. An ECHO was done at [facility] on 06/29/20 and LVEF read to be 40-45%. LHC done on 07/01/20 and found to have severe 3 vessel disease and occluded LAD at the site of the prior stent with collaterals. Plans noted for CABG tomorrow.   Current HF Medications: Metoprolol XL 12.5 mg daily  Prior to admission HF Medications: Metoprolol XL 25 mg daily  Pertinent Lab Values: . Serum creatinine 0.89, BUN 6, Potassium 4.6, Sodium 139, BNP 129.9  Vital Signs: . Weight: 176 lbs (admission weight: 172 lbs) . Blood pressure: 100/70s  . Heart rate: 90s   Medication Assistance / Insurance Benefits Check: Does the patient have prescription insurance?  No  Does the patient qualify for medication assistance through manufacturers or grants?   Yes . Eligible grants and/or patient assistance programs: pending . Medication assistance applications in progress: none  . Medication assistance applications approved: none Approve medication assistance renewals will be completed by: pending  Outpatient Pharmacy:  Prior to admission outpatient pharmacy: [facility] Is the patient willing to use [facility] at discharge? Yes Is the patient willing to transition their outpatient pharmacy to utilize a [facility] outpatient pharmacy?   Pending    Assessment: 1. Acute systolic CHF (EF 66%), due to ICM for CABG tomorrow. NYHA class II symptoms. - Does not appear volume overloaded on MD exam - no lasix needed - Continue metoprolol  XL 12.5 mg daily - BP remains too soft for Entresto or spironolactone at this time - Can consider starting SGLT2i prior to discharge pending clinical course post-CABG   Plan: 1) Medication changes recommended at this time: - Continue current regimen - Will follow-up post-CABG clinical course  2) Patient assistance: - Can register for patient assistance for Entresto and SGLT2i once initiated - Will consult HF CSW to help with insurance  3)  Education  - To be completed prior to discharge  Richardine Service, PharmD, [place] PGY2 Cardiology Pharmacy Resident  Kerby Nora, PharmD, BCPS Heart Failure Stewardship Pharmacist Phone [phone]

## 2020-07-02 NOTE — Consult Note (Addendum)
[address]       [place] [address]             [phone]        Mickle L Chiarelli Aucilla Medical Record [id number] Date of Birth: 04-26-1985  Referring: Dr. Daneen Schick Primary Care: Neale Burly, MD Primary Cardiologist:None  Reason for consult: Severe three-vessel coronary artery disease presenting with acute non-ST elevation myocardial infarction.   History of Present Illness:   Mr. Hendriks is a 35 year old male with a past history significant for known early onset coronary artery disease having sustained an anterior myocardial infarction at age 71 with subsequent bare-metal stenting to the LAD.  He has dyslipidemia and a 21-pack-year history of smoking.  He was apparently lost to cardiology follow-up for many years but recently presented to [facility] with complaint of chest pain.  He reported having pain both with exertion and at rest and typically these episodes were relieved with nitroglycerin.  This pattern has been ongoing for about 1 year until the day of admission when he had an episode that was not relieved after taking 3 nitroglycerin tablets taken within a half hour.  On initial presentation to his cardiologist at  [facility] on 06/27/2020, he had an EKG that showed possible ischemic changes but no evidence for acute MI.  Initial high-sensitivity troponin was 74.  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   He was transferred to [facility] for further evaluation and management on 06/29/2020.  By that time, he was continuing to have chest discomfort.  He was started on aspirin, heparin, and Coumadin for the suspected LV thrombus.  Later, IV nitroglycerin was added.  By the afternoon of 06/29/2020, his high-sensitivity troponin had risen to greater than 25,000 transthoracic echo was repeated on the day of admission that showed 2  areas of wall motion abnormality: one in the inferolateral segment felt to represent his acute infarction and another anterior apical segment with the attached thrombus which was felt to be the region of his old anterior infarction.  Overall ejection fraction was estimated at 40 to 45%.  There was mild mitral insufficiency.  There is no evidence of aortic valve dysfunction. Mr. Baldini was stabilized and remained anticoagulated.  He was taken to the Cath Lab on 07/01/2020 where left heart catheterization demonstrated severe three-vessel coronary artery disease.  Please see detailed report below.  CT surgery has been asked to evaluate Mr. Montville for consideration of coronary bypass grafting. The patient lives with his father in [place].  His mother passed away a few years ago.  He is unemployed and says he attempts to help his father with projects around the house but is otherwise inactive. He admits to having poor dentition with no dental care in a long time.  He is left-hand dominant   Current Activity/ Functional Status:   Zubrod Score: At the time of surgery this patient's most appropriate activity status/level should be described as: []     0    Normal activity, no symptoms []     1    Restricted in physical strenuous activity but ambulatory, able to do out light work [x]     2    Ambulatory and capable of self care, unable to do work activities, up and about  more than 50%  Of the time                            []     3    Only limited self care, in bed greater than 50% of waking hours []     4    Completely disabled, no self care, confined to bed or chair []     5    Moribund  Past Medical History:  Diagnosis Date  . Atypical chest pain   . CAD (coronary artery disease)   . Dyslipidemia   . Ex-smoker   . History of depression   . Insomnia   . Ischemic cardiomyopathy    Ejection fraction 40-45%  . NSTEMI (non-ST elevated myocardial infarction) ([place]) 08/2007   Treated with a  bare metal stent to the proximal LAD  . Suicide attempt ([place]) 01/2001    Past Surgical History:  Procedure Laterality Date  . ADENOIDECTOMY    . CORONARY STENT PLACEMENT     Bare metal stent to proximal LAD  . LEFT HEART CATH AND CORONARY ANGIOGRAPHY N/A 07/01/2020   Procedure: LEFT HEART CATH AND CORONARY ANGIOGRAPHY;  Surgeon: Lorretta Harp, MD;  Location: [facility];  Service: Cardiovascular;  Laterality: N/A;    Social History   Tobacco Use  Smoking Status Current Every Day Smoker  . Packs/day: 1.00  . Years: 21.00  . Pack years: 21.00  Smokeless Tobacco Not on file  Tobacco Comment   Has smoked a pack per day since he was 50    Social History   Substance and Sexual Activity  Alcohol Use Never     Allergies  Allergen Reactions  . Codeine Itching    Current Facility-Administered Medications  Medication Dose Route Frequency Provider Last Rate Last Admin  . 0.9 %  sodium chloride infusion  250 mL Intravenous PRN Lorretta Harp, MD      . acetaminophen (TYLENOL) tablet 650 mg  650 mg Oral Q4H PRN Lorretta Harp, MD      . aspirin chewable tablet 81 mg  81 mg Oral Daily Lorretta Harp, MD   81 mg at 07/02/20 0856  . atorvastatin (LIPITOR) tablet 40 mg  40 mg Oral Daily Lorretta Harp, MD   40 mg at 07/02/20 0856  . heparin ADULT infusion 100 units/mL (25000 units/227mL)  1,750 Units/hr Intravenous Continuous Kris Mouton, RPH 17.5 mL/hr at 07/02/20 1200 1,750 Units/hr at 07/02/20 1200  . HYDROcodone-acetaminophen (NORCO/VICODIN) 5-325 MG per tablet 1-2 tablet  1-2 tablet Oral Q4H PRN Lorretta Harp, MD   2 tablet at 07/02/20 0857  . HYDROmorphone (DILAUDID) injection 1 mg  1 mg Intravenous Q2H PRN Lorretta Harp, MD   1 mg at 07/02/20 1201  . metoprolol succinate (TOPROL-XL) 24 hr tablet 12.5 mg  12.5 mg Oral Daily Lorretta Harp, MD   12.5 mg at 07/02/20 0856  . nitroGLYCERIN (NITROSTAT) SL tablet 0.4 mg  0.4 mg Sublingual Q5 Min x 3 PRN  Lorretta Harp, MD      . nitroGLYCERIN 50 mg in dextrose 5 % 250 mL (0.2 mg/mL) infusion  0-200 mcg/min Intravenous Titrated Lorretta Harp, MD 3 mL/hr at 07/02/20 1200 10 mcg/min at 07/02/20 1200  . ondansetron (ZOFRAN) injection 4 mg  4 mg Intravenous Q6H PRN Lorretta Harp, MD      . sodium chloride flush (NS) 0.9 % injection 3  mL  3 mL Intravenous Q12H Lorretta Harp, MD   3 mL at 07/01/20 2132  . sodium chloride flush (NS) 0.9 % injection 3 mL  3 mL Intravenous Q12H Lorretta Harp, MD      . sodium chloride flush (NS) 0.9 % injection 3 mL  3 mL Intravenous PRN Lorretta Harp, MD        Medications Prior to Admission  Medication Sig Dispense Refill Last Dose  . atorvastatin (LIPITOR) 40 MG tablet Take 40 mg by mouth daily.   06/28/2020 at Unknown time  . metoprolol succinate (TOPROL-XL) 25 MG 24 hr tablet Take 25 mg by mouth daily.   06/28/2020 at 1400  . nitroGLYCERIN (NITROSTAT) 0.4 MG SL tablet Place 0.4 mg under the tongue every 5 (five) minutes as needed for chest pain.   06/29/2020 at Unknown time  . Warfarin Sodium (COUMADIN PO) Take 7.5 mg by mouth at bedtime. Pt does not know what strength   06/28/2020 at 1400    Family History  Problem Relation Age of Onset  . Hypertension Neg Hx   . Diabetes Neg Hx   . Coronary artery disease Neg Hx      Review of Systems:   ROS     Cardiac Review of Systems: Y or  [    ]= no  Chest Pain [ x   ]  Resting SOB [   ] Exertional SOB  [  ]  Orthopnea [  ]   Pedal Edema [   ]    Palpitations [  ] Syncope  [  ]   Presyncope [   ]  General Review of Systems: [Y] = yes [  ]=no Constitional: recent weight change [  ]; anorexia [x  ]; fatigue [  ]; nausea [  ]; night sweats [  ]; fever [  ]; or chills [  ]                                                               Dental: Last Dentist visit: Can't remember.  Eye : blurred vision [  ]; diplopia [   ]; vision changes [  ];  Amaurosis fugax[  ]; Resp: cough [  ];  wheezing[  ];   hemoptysis[  ]; shortness of breath[  ]; paroxysmal nocturnal dyspnea[  ]; dyspnea on exertion[  ]; or orthopnea[  ];  GI:  gallstones[  ], vomiting[  ];  dysphagia[  ]; melena[ x ];  hematochezia [  ]; heartburn[  ];   Hx of  Colonoscopy[  ]; GU: kidney stones [  ]; hematuria[  ];   dysuria [  ];  nocturia[  ];  history of     obstruction [  ]; urinary frequency [  ]             Skin: rash, swelling[  ];, hair loss[  ];  peripheral edema[  ];  or itching[  ]; Musculosketetal: myalgias[  ];  joint swelling[  ];  joint erythema[  ];  joint pain[  ];  back pain[  ];  Heme/Lymph: bruising[  ];  bleeding[  ];  anemia[  ];  Neuro: TIA[  ];  headaches[  ];  stroke[  ];  vertigo[  ];  seizures[  ];   paresthesias[  ];  difficulty walking[  ];  Psych:depression[ x ]; anxiety[  ];  Endocrine: diabetes[  ];  thyroid dysfunction[  ];              Physical Exam: BP 103/60 (BP Location: Right Arm)   Pulse 85   Temp 97.9 F (36.6 C) (Oral)   Resp 16   Ht 6\' 1"  (1.854 m)   Wt 80 kg   SpO2 94%   BMI 23.27 kg/m    General appearance: alert, cooperative and no distress Head: Normocephalic, without obvious abnormality, atraumatic Neck: no adenopathy, no carotid bruit, no JVD and supple, symmetrical, trachea midline Lymph nodes: Cervical, supraclavicular, and axillary nodes normal. and No cervical or clavicular adenopathy Resp: clear to auscultation bilaterally Cardio: Regular rhythm.  Monitor shows normal sinus rhythm with a rate of 102. GI: Soft, nontender, hypoactive bowel sounds Extremities: Thin with significant muscle wasting throughout.  All peripheral pulses are easily palpable.  Modified Allen's test was performed on both hands with pulse oximetry measured at the thumbs.  There was an excellent waveform that persisted continued without change while the radial arteries were being individually compressed . Neurologic: Grossly normal  Diagnostic Studies & Laboratory data:  LEFT HEART CATH AND  CORONARY ANGIOGRAPHY    Conclusion    Prox LAD to Mid LAD lesion is 100% stenosed.  Ost RCA to Prox RCA lesion is 100% stenosed.  Prox Cx to Mid Cx lesion is 95% stenosed.   BIRCH FARINO is a 35 y.o. male    [id number] LOCATION:  FACILITY: [facility]  PHYSICIAN: Quay Burow, M.D. [birth year]   DATE OF PROCEDURE:  07/01/2020  DATE OF DISCHARGE:     CARDIAC CATHETERIZATION     History obtained from chart review.35 y.o.malewith a very early onset CAD and remote anterior STEMI with LAD stent at age 55, presenting with unstable angina, evolved to posterior wall infarction and mild reduction in overall LVEF.Echocardiogram shows a possible apical left ventricular thrombus, but this appears to be organized possibly calcified.   IMPRESSION: [name] has three-vessel disease.  He has an occluded LAD at the site of prior stenting with left to left and right to left collaterals as well as an occluded codominant RCA with right to LAD collaterals.  He has a 95% stenosis in the mid AV groove circumflex which is a codominant vessel just prior to the takeoff of a moderate-sized marginal branch.  LVEDP is not elevated.  He is relatively hypotensive in the Cath Lab and did receive a fluid bolus.  After review with his attending, Dr. Tamala Julian, it was decided to take a step back, to viability of the anterior wall, and consider CABG if his anterior wall is viable.  If not, he will need stenting of his mid AV groove circumflex.  Sheath was removed and a TR band was placed on the right wrist to achieve patent hemostasis.  The patient left lab in stable condition.  Coronary Findings   Diagnostic Dominance: Co-dominant  Left Anterior Descending  Collaterals  Mid LAD filled by collaterals from Dist Cx.    Collaterals  Dist LAD filled by collaterals from Mid RCA.    Prox LAD to Mid LAD lesion is 100% stenosed. The lesion was previously treated.  Left Circumflex  Prox Cx to  Mid Cx lesion is 95% stenosed. Vessel is the culprit lesion.  Right Coronary Artery  Ost RCA to Prox RCA lesion  is 100% stenosed.   Intervention   No interventions have been documented.  Coronary Diagrams   Diagnostic Dominance: Co-dominant        Quay Burow. MD, [facility] 07/01/2020 12:55 PM   ECHOCARDIOGRAM REPORT       Patient Name:  DANNON NGUYENTHI Date of Exam: 06/29/2020  Medical Rec #: [id number]    Height:    73.0 in  Accession #:  [id number]    Weight:    172.6 lb  Date of Birth: May 06, 1985    BSA:     2.021 m  Patient Age:  89 years     BP:      106/79 mmHg  Patient Gender: M        HR:      73 bpm.  Exam Location: Inpatient   Procedure: 2D Echo, Cardiac Doppler and Color Doppler   Indications:  I25.5 Ischemic cardiomyopathy    History:    Patient has no prior history of Echocardiogram  examinations.         Previous Myocardial Infarction, CAD and Acute MI;         Signs/Symptoms:Chest Pain. Known clot seen in the apex on  echo         two days prior at outside hospital.    Sonographer:  [place]  Referring Phys: [id number] [place]    1. 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 Left ventricular ejection fraction,  by estimation, is 40 to 45%. The  left ventricle has mildly decreased function. The left ventricle  demonstrates regional wall motion abnormalities (see scoring  diagram/findings for description). Left ventricular diastolic parameters  are consistent with Grade II diastolic dysfunction  (pseudonormalization). Elevated left atrial pressure. There is severe  hypokinesis of the entire left ventricular inferolateral wall. There is  moderate hypokinesis of the left ventricular  inferior wall. There is  akinesis of a small segment of the apical  anterior wall.  2. Right ventricular systolic function is normal. The right ventricular  size is normal.  3. Left atrial size was mildly dilated.  4. The mitral valve is normal in structure. Mild mitral valve  regurgitation.  5. The aortic valve is normal in structure. Aortic valve regurgitation is  not visualized.   Comparison(s): No prior Echocardiogram.   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   FINDINGS  Left Ventricle: There is a small (6x7 mm) hyperechogenic thrombus  (possibly calcified) at the left ventricular apex, in the area of the  akinetic anteroapical segment. It has the appearance of an old organized  thrombus. Left ventricular ejection  fraction, by estimation, is 40 to 45%. The left ventricle has mildly  decreased function. The left ventricle demonstrates regional wall motion  abnormalities. Severe hypokinesis of the left ventricular, entire  inferolateral wall. Moderate hypokinesis of  the left ventricular, entire inferior wall. Severe akinesis of the left  ventricular, apical anterior wall. The left ventricular internal cavity  size was normal in size. There is no left ventricular hypertrophy. Left  ventricular diastolic parameters are  consistent with Grade II diastolic dysfunction (pseudonormalization).  Elevated left atrial pressure.   Right Ventricle: The right ventricular size is  normal. No increase in  right ventricular wall thickness. Right ventricular systolic function is  normal.   Left Atrium: Left atrial size was mildly dilated.   Right Atrium: Right atrial size was normal in size.   Pericardium: There is no evidence of pericardial effusion.   Mitral Valve: The mitral valve is  normal in structure. Mild mitral valve  regurgitation, with centrally-directed jet.   Tricuspid Valve: The tricuspid valve is normal in structure. Tricuspid  valve regurgitation is not demonstrated.   Aortic Valve: The aortic valve is normal in structure. Aortic valve  regurgitation is not visualized. Aortic valve mean gradient measures 2.0  mmHg. Aortic valve peak gradient measures 2.9 mmHg. Aortic valve area, by  VTI measures 2.75 cm.   Pulmonic Valve: The pulmonic valve was normal in structure. Pulmonic valve  regurgitation is not visualized.   Aorta: The aortic root and ascending aorta are structurally normal, with  no evidence of dilitation.   IAS/Shunts: No atrial level shunt detected by color flow Doppler.     LEFT VENTRICLE  PLAX 2D  LVIDd:     5.40 cm   Diastology  LVIDs:     4.20 cm   LV e' medial:  8.81 cm/s  LV PW:     0.90 cm   LV E/e' medial: 7.5  LV IVS:    0.80 cm   LV e' lateral:  8.70 cm/s  LVOT diam:   2.30 cm   LV E/e' lateral: 7.6  LV SV:     40  LV SV Index:  20  LVOT Area:   4.15 cm    LV Volumes (MOD)  LV vol d, MOD A2C: 115.0 ml  LV vol d, MOD A4C: 157.0 ml  LV vol s, MOD A2C: 74.1 ml  LV vol s, MOD A4C: 76.8 ml  LV SV MOD A2C:   40.9 ml  LV SV MOD A4C:   157.0 ml  LV SV MOD BP:   58.6 ml   RIGHT VENTRICLE  RV Basal diam: 3.40 cm   LEFT ATRIUM       Index    RIGHT ATRIUM      Index  LA diam:    3.70 cm 1.83 cm/m RA Area:   20.20 cm  LA Vol (A2C):  67.9 ml 33.60 ml/m RA Volume:  54.00 ml 26.72 ml/m  LA Vol (A4C):  49.1 ml 24.29 ml/m  LA Biplane Vol: 59.3 ml 29.34 ml/m  AORTIC VALVE  AV Area (Vmax):  2.85 cm  AV Area (Vmean):  2.73 cm  AV Area (VTI):   2.75 cm  AV Vmax:      85.70 cm/s  AV Vmean:     56.700 cm/s  AV VTI:      0.147 m  AV Peak Grad:   2.9 mmHg  AV Mean Grad:   2.0 mmHg  LVOT Vmax:     58.70 cm/s  LVOT  Vmean:    37.300 cm/s  LVOT VTI:     0.097 m  LVOT/AV VTI ratio: 0.66    AORTA  Ao Root diam: 3.40 cm   MITRAL VALVE  MV Area (PHT): 5.02 cm  SHUNTS  MV Decel Time: 151 msec  Systemic VTI: 0.10 m  MV E velocity: 66.00 cm/s Systemic Diam: 2.30 cm  MV A velocity: 51.40 cm/s  MV E/A ratio: 1.28   Mihai Croitoru MD  Electronically signed by Sanda Klein MD  Signature Date/Time: 06/30/2020/9:59:07 AM  Recent Radiology Findings:   A cardiac MRI has been performed earlier today.  Results are pending.     I have independently reviewed the above radiologic studies and discussed with the patient   Recent Lab Findings: Lab Results  Component Value Date   WBC 11.8 (H) 07/02/2020   HGB 14.4 07/02/2020   HCT 44.2 07/02/2020   PLT 151 07/02/2020   GLUCOSE 94 07/02/2020   CHOL 125 06/29/2020   TRIG 75 06/29/2020   HDL 27 (L) 06/29/2020   LDLCALC 83 06/29/2020   ALT 44 06/29/2020   AST 36 06/29/2020   NA 139 07/02/2020   K 4.6 07/02/2020   CL 103 07/02/2020   CREATININE 0.89 07/02/2020   BUN 6 07/02/2020   CO2 29 07/02/2020   TSH 0.978 06/29/2020   INR 1.4 (H) 07/02/2020   HGBA1C 5.5 06/30/2020      Assessment / Plan:      -35 year old male with severe three-vessel coronary artery disease presenting with an acute inferolateral myocardial infarction.  He has a prior history of an anterior MI as well with a small, 6 x 7 mm, likely calcific thrombus near the apex of the left ventricle.  Coronary bypass grafting is his best option for revascularization.  The procedure but did perioperative course have been discussed with him in detail.  He would like for [place] to proceed with preoperative work-up and plans for surgery tomorrow.      I  spent 30 minutes counseling the patient face to face.  Antony Odea, PA-C  07/02/2020 1:08 PM     Agree with above. 35 yo male with severe 3V CAD, and CHF.  On review of his LHC, he has targets suitable  for bypass.  The cardiac MRI does show viable myocardium in much of the LAD territory except at the apex.  There is also an LV thrombus, but it is less than 1cm, and thus will likely resolve with anticoagulation.  The risks and benefits have been discussed, and he is agreeable to proceed with CABG 4 with right radial artery harvest.  Due to his heart failure, a continuous cardiac output swan will be placed pre-operatively  Xayvion Shirah Bary Leriche

## 2020-07-02 NOTE — Progress Notes (Signed)
Heart Failure Nurse Navigator Progress Note  PCP: Neale Burly, MD PCP-Cardiologist: none listed Admission Diagnosis: CP Admitted from: home with father and brother.  Presentation:   Wyatt Donovan presented with chest pain. Had initial PCI at age 35. Elevated troponin on admission and chest pain. LHC 07/01/20: severe 3VD--CVTS consult, work up for CABG in process. Completed cMRI this AM.  Pt resting in bed on room air. Pt stated he has no income, no insurance, disability denied x2. Asked for new PCP list for [place] area--[place] HF CSW referred. Pt stated he has trouble spelling, but no issues with reading/comprehension. Pt states his top teeth have been slowly grinding away, but does not have any sensitivity. States issues come from no dental visits and from medication issues. Pt father pays all bills, rent, medication/medical costs and transportation needs. Explained [facility] services to pt-- declined at this time. Financial counselor has already been to pt beside this am today-appreciate HF CSW follow through. Pt states they occasionally have mice in house. Pt states he is ready to stop smoking, last swisher sweet cigarillos was Thursday before admission, pt did smoke in house. Educated and encouraged smoking cessation.    Pt states he is planned for first CABG case 5/4--Dr. Kipp Brood + Dr. Roxy Manns had spoke to pt today regarding planned surgery. (per pt. Statement).  Navigator educated and gave pt incentive spirometer. Verified with teachback method- pulled 1250-1532mL successfully x 5.   ECHO/ LVEF: 35-40% on stress echo with apical aneurysm and small thrombus near apex of LV.   Clinical Course:  Past Medical History:  Diagnosis Date  . Atypical chest pain   . CAD (coronary artery disease)   . Dyslipidemia   . Ex-smoker   . History of depression   . Insomnia   . Ischemic cardiomyopathy    Ejection fraction 40-45%  . NSTEMI (non-ST elevated myocardial infarction) ([place])  08/2007   Treated with a bare metal stent to the proximal LAD  . Suicide attempt [facility]) 01/2001    Social History   Socioeconomic History  . Marital status: Single    Spouse name: Not on file  . Number of children: Not on file  . Years of education: Not on file  . Highest education level: High school graduate  Occupational History  . Occupation: Not actively working    Comment: applied for disability x 2.   Tobacco Use  . Smoking status: Current Every Day Smoker    Packs/day: 1.00    Years: 22.00    Pack years: 22.00    Types: Cigarettes, Cigars  . Smokeless tobacco: Never Used  . Tobacco comment: cigarettes stopped in 2020, swisher sweets started in 2018-04-05/day  Vaping Use  . Vaping Use: Never used  Substance and Sexual Activity  . Alcohol use: Never  . Drug use: Not Currently    Types: Marijuana  . Sexual activity: Never  Other Topics Concern  . Not on file  Social History Narrative   Single   Lives with his parents   Trying to get his GED   DeGent date all cultures   Social Determinants of Health   Financial Resource Strain: High Risk  . Difficulty of Paying Living Expenses: Hard  Food Insecurity: No Food Insecurity  . Worried About Charity fundraiser in the Last Year: Never true  . Ran Out of Food in the Last Year: Never true  Transportation Needs: No Transportation Needs  . Lack of Transportation (Medical): No  .  Lack of Transportation (Non-Medical): No  Physical Activity: Not on file  Stress: Not on file  Social Connections: Not on file    High Risk Criteria for Readmission and/or Poor Patient Outcomes:  Heart failure hospital admissions (last 6 months): 1   No Show rate: N/A  Difficult social situation: yes  Demonstrates medication adherence: yes-per pt statement  Primary Language: English  Literacy level: able to read and comprehend-concern for adequate safe self care, especially s/p large surgery.  Barriers of Care:   -safe self  care -knowledge/understanding -questionable medication compliance -financial strain-no income -no insurance -smoking cessation -medication cost  Considerations/Referrals:   Referral made to Heart Failure Pharmacist Stewardship: yes, appreciated Referral made to Heart & Vascular TOC clinic: pending-potentially planned CABG, will continue to follow this admission.  Pricilla Holm, RN, BSN Heart Failure Nurse Navigator [phone]

## 2020-07-02 NOTE — Progress Notes (Signed)
ANTICOAGULATION CONSULT NOTE - Follow Up Consult  Pharmacy Consult for IV Heparin Indication: chest pain/ACS, LV apical thrombus  Allergies  Allergen Reactions  . Codeine Itching    Patient Measurements: Height: 6\' 1"  (185.4 cm) Weight: 80 kg (176 lb 5.9 oz) IBW/kg (Calculated) : 79.9  Heparin Dosing Weight: 80 kg  Vital Signs: Temp: 97.9 F (36.6 C) (05/03 1200) Temp Source: Oral (05/03 1200) BP: 99/63 (05/03 1800) Pulse Rate: 98 (05/03 1800)  Labs: Recent Labs    06/30/20 0013 06/30/20 0544 07/01/20 0044 07/01/20 0909 07/01/20 2259 07/02/20 0838 07/02/20 1827  HGB 14.7  --  14.0  --   --  14.4  --   HCT 45.5  --  42.8  --   --  44.2  --   PLT 178  --  151  --   --  151  --   LABPROT  --   --  18.8*  --   --  16.8*  --   INR  --   --  1.6*  --   --  1.4*  --   HEPARINUNFRC 0.32  --  0.26*   < > 0.19* 0.18* 0.29*  CREATININE  --   --  0.99  --   --  0.89  --   TROPONINIHS  --  >27,000*  --   --   --   --   --    < > = values in this interval not displayed.    Estimated Creatinine Clearance: 128.4 mL/min (by C-G formula based on SCr of 0.89 mg/dL).   Medical History: Past Medical History:  Diagnosis Date  . Atypical chest pain   . CAD (coronary artery disease)   . Dyslipidemia   . Ex-smoker   . History of depression   . Insomnia   . Ischemic cardiomyopathy    Ejection fraction 40-45%  . NSTEMI (non-ST elevated myocardial infarction) ([place]) 08/2007   Treated with a bare metal stent to the proximal LAD  . Suicide attempt ([place]) 01/2001     Assessment: 35 yr old man presented with CP and NSTEMI. Pt recently started warfarin for L apical clot seen on ECHO 4/28 (he does not know his warfarin regimen). Pharmacy was consulted to dose IV  heparin. Pt is S/P cardiac cath on 5/2, which showed severe 3-vessel CAD. CVTS was consulted; CABG scheduled for 07/03/20. Heparin was resumed post cath.  Heparin level ~7 hrs after heparin infusion was increased to 1750  units/hr was 0.29 units/ml, which is below the goal range for this pt. H/H 14.4/44.2, plt 151 (CBC stable). Per RN, no issues with IV or bleeding observed.  Goal of Therapy:  Heparin level 0.3-0.7 units/ml Monitor platelets by anticoagulation protocol: Yes   Plan:  Increase heparin infusion to 1850 units/hr Check heparin level in 6 hrs Monitor daily heparin level, CBC Monitor for bleeding F/U after CABG tomorrow, 5/4  Gillermina Hu, PharmD, BCPS, [facility] Clinical Pharmacist

## 2020-07-02 NOTE — Progress Notes (Signed)
Pre- CABG exam has been completed.  Results can be found under chart review under CV PROC. 07/02/2020 4:13 PM Fenna Semel RVT, RDMS & Apolonio Schneiders RVT, RDMS

## 2020-07-02 NOTE — Progress Notes (Signed)
Discussed IS (1250 mL), sternal precautions, mobility post op, and d/c planning. Pt receptive. Accepted ed materials. Pts dad will be with him at d/c. [phone] Yves Dill CES, ACSM 3:00 PM 07/02/2020

## 2020-07-02 NOTE — Progress Notes (Signed)
[facility] for Heparin Indication: chest pain/ACS, LV apical thrombus  Allergies  Allergen Reactions  . Codeine Itching    Patient Measurements: Height: 6\' 1"  (185.4 cm) Weight: 80 kg (176 lb 5.9 oz) IBW/kg (Calculated) : 79.9  Vital Signs: Temp: 98.4 F (36.9 C) (05/03 0300) Temp Source: Oral (05/03 0300) BP: 104/73 (05/03 0856) Pulse Rate: 98 (05/03 0856)  Labs: Recent Labs    06/29/20 1437 06/29/20 2109 06/30/20 0013 06/30/20 0544 07/01/20 0044 07/01/20 0909 07/01/20 2259 07/02/20 0838  HGB  --    < > 14.7  --  14.0  --   --  14.4  HCT  --   --  45.5  --  42.8  --   --  44.2  PLT  --   --  178  --  151  --   --  151  LABPROT  --   --   --   --  18.8*  --   --  16.8*  INR  --   --   --   --  1.6*  --   --  1.4*  HEPARINUNFRC 0.32   < > 0.32  --  0.26* 0.19* 0.19* 0.18*  CREATININE  --   --   --   --  0.99  --   --  0.89  TROPONINIHS 25,186*  --   --  >27,000*  --   --   --   --    < > = values in this interval not displayed.    Estimated Creatinine Clearance: 128.4 mL/min (by C-G formula based on SCr of 0.89 mg/dL).   Medical History: Past Medical History:  Diagnosis Date  . Atypical chest pain   . CAD (coronary artery disease)   . Dyslipidemia   . Ex-smoker   . History of depression   . Insomnia   . Ischemic cardiomyopathy    Ejection fraction 40-45%  . NSTEMI (non-ST elevated myocardial infarction) ([place]) 08/2007   Treated with a bare metal stent to the proximal LAD  . Suicide attempt ([place]) 01/2001     Assessment: 35 y.o. M presents with CP. Pt recently started warfarin for L apical clot seen on ECHO 4/28. Marland Kitchen Pt does not know what dose of coumadin he was taking. Pharmacy dosing heparin.   Heparin resumed post cath. Plans are CABG vs PCI -heparin level= 0.18 -hg= 14.4   Goal of Therapy:  Heparin level 0.3-0.7 units/ml Monitor platelets by anticoagulation protocol: Yes   Plan:  -Increase heparin to 1750  units/hr -Heparin level in 6 hours and daily wth CBC daily  Hildred Laser, PharmD Clinical Pharmacist **Pharmacist phone directory can now be found on [email or web address] ([email or web address]).  Listed under [place].

## 2020-07-03 ENCOUNTER — Inpatient Hospital Stay (HOSPITAL_COMMUNITY): Payer: Medicaid Other

## 2020-07-03 ENCOUNTER — Inpatient Hospital Stay (HOSPITAL_COMMUNITY): Payer: Medicaid Other | Admitting: Certified Registered Nurse Anesthetist

## 2020-07-03 ENCOUNTER — Inpatient Hospital Stay (HOSPITAL_COMMUNITY)
Admission: EM | Disposition: A | Discharge status: Home / Self Care | Payer: Self-pay | Source: Home / Self Care | Attending: Thoracic Surgery (Cardiothoracic Vascular Surgery)

## 2020-07-03 DIAGNOSIS — I2510 Atherosclerotic heart disease of native coronary artery without angina pectoris: Secondary | ICD-10-CM | POA: Diagnosis present

## 2020-07-03 DIAGNOSIS — Z951 Presence of aortocoronary bypass graft: Secondary | ICD-10-CM

## 2020-07-03 DIAGNOSIS — I5023 Acute on chronic systolic (congestive) heart failure: Secondary | ICD-10-CM

## 2020-07-03 HISTORY — PX: RADIAL ARTERY HARVEST: SHX5067

## 2020-07-03 HISTORY — PX: CORONARY ARTERY BYPASS GRAFT: SHX141

## 2020-07-03 HISTORY — PX: TEE WITHOUT CARDIOVERSION: SHX5443

## 2020-07-03 LAB — POCT I-STAT, CHEM 8
BUN: 3 mg/dL — ABNORMAL LOW (ref 6–20)
BUN: <3 mg/dL — ABNORMAL LOW (ref 6–20)
BUN: <3 mg/dL — ABNORMAL LOW (ref 6–20)
BUN: 3 mg/dL — ABNORMAL LOW (ref 6–20)
BUN: 4 mg/dL — ABNORMAL LOW (ref 6–20)
Calcium, Ion: 1.16 mmol/L (ref 1.15–1.40)
Calcium, Ion: 1.15 mmol/L (ref 1.15–1.40)
Calcium, Ion: 1.04 mmol/L — ABNORMAL LOW (ref 1.15–1.40)
Calcium, Ion: 1.04 mmol/L — ABNORMAL LOW (ref 1.15–1.40)
Calcium, Ion: 1.32 mmol/L (ref 1.15–1.40)
Chloride: 105 mmol/L (ref 98–111)
Chloride: 105 mmol/L (ref 98–111)
Chloride: 101 mmol/L (ref 98–111)
Chloride: 105 mmol/L (ref 98–111)
Chloride: 105 mmol/L (ref 98–111)
Creatinine, Ser: 0.6 mg/dL — ABNORMAL LOW (ref 0.61–1.24)
Creatinine, Ser: 0.6 mg/dL — ABNORMAL LOW (ref 0.61–1.24)
Creatinine, Ser: 0.5 mg/dL — ABNORMAL LOW (ref 0.61–1.24)
Creatinine, Ser: 0.5 mg/dL — ABNORMAL LOW (ref 0.61–1.24)
Creatinine, Ser: 0.5 mg/dL — ABNORMAL LOW (ref 0.61–1.24)
Glucose, Bld: 94 mg/dL (ref 70–99)
Glucose, Bld: 92 mg/dL (ref 70–99)
Glucose, Bld: 89 mg/dL (ref 70–99)
Glucose, Bld: 125 mg/dL — ABNORMAL HIGH (ref 70–99)
Glucose, Bld: 124 mg/dL — ABNORMAL HIGH (ref 70–99)
HCT: 37 % — ABNORMAL LOW (ref 39.0–52.0)
HCT: 34 % — ABNORMAL LOW (ref 39.0–52.0)
HCT: 26 % — ABNORMAL LOW (ref 39.0–52.0)
HCT: 25 % — ABNORMAL LOW (ref 39.0–52.0)
HCT: 28 % — ABNORMAL LOW (ref 39.0–52.0)
Hemoglobin: 12.6 g/dL — ABNORMAL LOW (ref 13.0–17.0)
Hemoglobin: 11.6 g/dL — ABNORMAL LOW (ref 13.0–17.0)
Hemoglobin: 8.8 g/dL — ABNORMAL LOW (ref 13.0–17.0)
Hemoglobin: 8.5 g/dL — ABNORMAL LOW (ref 13.0–17.0)
Hemoglobin: 9.5 g/dL — ABNORMAL LOW (ref 13.0–17.0)
Potassium: 3 mmol/L — ABNORMAL LOW (ref 3.5–5.1)
Potassium: 3.4 mmol/L — ABNORMAL LOW (ref 3.5–5.1)
Potassium: 3.1 mmol/L — ABNORMAL LOW (ref 3.5–5.1)
Potassium: 4 mmol/L (ref 3.5–5.1)
Potassium: 4.1 mmol/L (ref 3.5–5.1)
Sodium: 142 mmol/L (ref 135–145)
Sodium: 140 mmol/L (ref 135–145)
Sodium: 142 mmol/L (ref 135–145)
Sodium: 138 mmol/L (ref 135–145)
Sodium: 139 mmol/L (ref 135–145)
TCO2: 27 mmol/L (ref 22–32)
TCO2: 25 mmol/L (ref 22–32)
TCO2: 24 mmol/L (ref 22–32)
TCO2: 25 mmol/L (ref 22–32)
TCO2: 26 mmol/L (ref 22–32)

## 2020-07-03 LAB — POCT I-STAT EG7
Acid-Base Excess: 1 mmol/L (ref 0.0–2.0)
Bicarbonate: 25.9 mmol/L (ref 20.0–28.0)
Calcium, Ion: 0.98 mmol/L — ABNORMAL LOW (ref 1.15–1.40)
HCT: 30 % — ABNORMAL LOW (ref 39.0–52.0)
Hemoglobin: 10.2 g/dL — ABNORMAL LOW (ref 13.0–17.0)
O2 Saturation: 69 %
Potassium: 3.3 mmol/L — ABNORMAL LOW (ref 3.5–5.1)
Sodium: 143 mmol/L (ref 135–145)
TCO2: 27 mmol/L (ref 22–32)
pCO2, Ven: 43.5 mmHg — ABNORMAL LOW (ref 44.0–60.0)
pH, Ven: 7.384 (ref 7.250–7.430)
pO2, Ven: 37 mmHg (ref 32.0–45.0)

## 2020-07-03 LAB — BASIC METABOLIC PANEL
Anion gap: 4 — ABNORMAL LOW (ref 5–15)
BUN: 5 mg/dL — ABNORMAL LOW (ref 6–20)
CO2: 23 mmol/L (ref 22–32)
Calcium: 7.6 mg/dL — ABNORMAL LOW (ref 8.9–10.3)
Chloride: 110 mmol/L (ref 98–111)
Creatinine, Ser: 0.68 mg/dL (ref 0.61–1.24)
GFR, Estimated: >60 mL/min (ref >60)
Glucose, Bld: 114 mg/dL — ABNORMAL HIGH (ref 70–99)
Potassium: 4.6 mmol/L (ref 3.5–5.1)
Sodium: 137 mmol/L (ref 135–145)

## 2020-07-03 LAB — COMPREHENSIVE METABOLIC PANEL
ALT: 33 U/L (ref 0–44)
AST: 59 U/L — ABNORMAL HIGH (ref 15–41)
Albumin: 2.3 g/dL — ABNORMAL LOW (ref 3.5–5.0)
Alkaline Phosphatase: 71 U/L (ref 38–126)
Anion gap: 4 — ABNORMAL LOW (ref 5–15)
BUN: <5 mg/dL — ABNORMAL LOW (ref 6–20)
CO2: 26 mmol/L (ref 22–32)
Calcium: 8 mg/dL — ABNORMAL LOW (ref 8.9–10.3)
Chloride: 109 mmol/L (ref 98–111)
Creatinine, Ser: 0.85 mg/dL (ref 0.61–1.24)
GFR, Estimated: >60 mL/min (ref >60)
Glucose, Bld: 100 mg/dL — ABNORMAL HIGH (ref 70–99)
Potassium: 3.5 mmol/L (ref 3.5–5.1)
Sodium: 139 mmol/L (ref 135–145)
Total Bilirubin: 0.5 mg/dL (ref 0.3–1.2)
Total Protein: 5.6 g/dL — ABNORMAL LOW (ref 6.5–8.1)

## 2020-07-03 LAB — CBC
HCT: 39.8 % (ref 39.0–52.0)
HCT: 33.9 % — ABNORMAL LOW (ref 39.0–52.0)
HCT: 29.6 % — ABNORMAL LOW (ref 39.0–52.0)
Hemoglobin: 13.2 g/dL (ref 13.0–17.0)
Hemoglobin: 11.1 g/dL — ABNORMAL LOW (ref 13.0–17.0)
Hemoglobin: 9.8 g/dL — ABNORMAL LOW (ref 13.0–17.0)
MCH: 31.1 pg (ref 26.0–34.0)
MCH: 31.3 pg (ref 26.0–34.0)
MCH: 31.8 pg (ref 26.0–34.0)
MCHC: 33.2 g/dL (ref 30.0–36.0)
MCHC: 32.7 g/dL (ref 30.0–36.0)
MCHC: 33.1 g/dL (ref 30.0–36.0)
MCV: 93.6 fL (ref 80.0–100.0)
MCV: 95.5 fL (ref 80.0–100.0)
MCV: 96.1 fL (ref 80.0–100.0)
Platelets: 152 10*3/uL (ref 150–400)
Platelets: 118 10*3/uL — ABNORMAL LOW (ref 150–400)
Platelets: 116 10*3/uL — ABNORMAL LOW (ref 150–400)
RBC: 4.25 MIL/uL (ref 4.22–5.81)
RBC: 3.55 MIL/uL — ABNORMAL LOW (ref 4.22–5.81)
RBC: 3.08 MIL/uL — ABNORMAL LOW (ref 4.22–5.81)
RDW: 13.5 % (ref 11.5–15.5)
RDW: 13.6 % (ref 11.5–15.5)
RDW: 13.8 % (ref 11.5–15.5)
WBC: 8.8 10*3/uL (ref 4.0–10.5)
WBC: 8.7 10*3/uL (ref 4.0–10.5)
WBC: 7 10*3/uL (ref 4.0–10.5)
nRBC: 0 % (ref 0.0–0.2)
nRBC: 0 % (ref 0.0–0.2)
nRBC: 0 % (ref 0.0–0.2)

## 2020-07-03 LAB — POCT I-STAT 7, (LYTES, BLD GAS, ICA,H+H)
Acid-Base Excess: 2 mmol/L (ref 0.0–2.0)
Acid-Base Excess: 1 mmol/L (ref 0.0–2.0)
Acid-Base Excess: 2 mmol/L (ref 0.0–2.0)
Acid-Base Excess: 0 mmol/L (ref 0.0–2.0)
Bicarbonate: 25.5 mmol/L (ref 20.0–28.0)
Bicarbonate: 25.9 mmol/L (ref 20.0–28.0)
Bicarbonate: 26.3 mmol/L (ref 20.0–28.0)
Bicarbonate: 24.7 mmol/L (ref 20.0–28.0)
Calcium, Ion: 1.14 mmol/L — ABNORMAL LOW (ref 1.15–1.40)
Calcium, Ion: 1.01 mmol/L — ABNORMAL LOW (ref 1.15–1.40)
Calcium, Ion: 1.04 mmol/L — ABNORMAL LOW (ref 1.15–1.40)
Calcium, Ion: 1.33 mmol/L (ref 1.15–1.40)
HCT: 38 % — ABNORMAL LOW (ref 39.0–52.0)
HCT: 29 % — ABNORMAL LOW (ref 39.0–52.0)
HCT: 30 % — ABNORMAL LOW (ref 39.0–52.0)
HCT: 32 % — ABNORMAL LOW (ref 39.0–52.0)
Hemoglobin: 12.9 g/dL — ABNORMAL LOW (ref 13.0–17.0)
Hemoglobin: 10.2 g/dL — ABNORMAL LOW (ref 13.0–17.0)
Hemoglobin: 9.9 g/dL — ABNORMAL LOW (ref 13.0–17.0)
Hemoglobin: 10.9 g/dL — ABNORMAL LOW (ref 13.0–17.0)
O2 Saturation: 100 %
O2 Saturation: 100 %
O2 Saturation: 100 %
O2 Saturation: 99 %
Potassium: 3 mmol/L — ABNORMAL LOW (ref 3.5–5.1)
Potassium: 3.1 mmol/L — ABNORMAL LOW (ref 3.5–5.1)
Potassium: 3.7 mmol/L (ref 3.5–5.1)
Potassium: 4.1 mmol/L (ref 3.5–5.1)
Sodium: 143 mmol/L (ref 135–145)
Sodium: 141 mmol/L (ref 135–145)
Sodium: 143 mmol/L (ref 135–145)
Sodium: 140 mmol/L (ref 135–145)
TCO2: 27 mmol/L (ref 22–32)
TCO2: 27 mmol/L (ref 22–32)
TCO2: 27 mmol/L (ref 22–32)
TCO2: 26 mmol/L (ref 22–32)
pCO2 arterial: 37.2 mmHg (ref 32.0–48.0)
pCO2 arterial: 38.5 mmHg (ref 32.0–48.0)
pCO2 arterial: 39.9 mmHg (ref 32.0–48.0)
pCO2 arterial: 39.2 mmHg (ref 32.0–48.0)
pH, Arterial: 7.444 (ref 7.350–7.450)
pH, Arterial: 7.42 (ref 7.350–7.450)
pH, Arterial: 7.443 (ref 7.350–7.450)
pH, Arterial: 7.407 (ref 7.350–7.450)
pO2, Arterial: 449 mmHg — ABNORMAL HIGH (ref 83.0–108.0)
pO2, Arterial: 267 mmHg — ABNORMAL HIGH (ref 83.0–108.0)
pO2, Arterial: 323 mmHg — ABNORMAL HIGH (ref 83.0–108.0)
pO2, Arterial: 148 mmHg — ABNORMAL HIGH (ref 83.0–108.0)

## 2020-07-03 LAB — HEMOGLOBIN A1C
Hgb A1c MFr Bld: 5.5 % (ref 4.8–5.6)
Mean Plasma Glucose: 111.15 mg/dL

## 2020-07-03 LAB — BLOOD GAS, VENOUS
Acid-Base Excess: 1 mmol/L (ref 0.0–2.0)
Bicarbonate: 25.2 mmol/L (ref 20.0–28.0)
FIO2: 21
O2 Saturation: 88.2 %
Patient temperature: 37
pCO2, Ven: 40.9 mmHg — ABNORMAL LOW (ref 44.0–60.0)
pH, Ven: 7.406 (ref 7.250–7.430)
pO2, Ven: 55 mmHg — ABNORMAL HIGH (ref 32.0–45.0)

## 2020-07-03 LAB — GLUCOSE, CAPILLARY
Glucose-Capillary: 122 mg/dL — ABNORMAL HIGH (ref 70–99)
Glucose-Capillary: 118 mg/dL — ABNORMAL HIGH (ref 70–99)
Glucose-Capillary: 106 mg/dL — ABNORMAL HIGH (ref 70–99)
Glucose-Capillary: 110 mg/dL — ABNORMAL HIGH (ref 70–99)
Glucose-Capillary: 117 mg/dL — ABNORMAL HIGH (ref 70–99)
Glucose-Capillary: 126 mg/dL — ABNORMAL HIGH (ref 70–99)

## 2020-07-03 LAB — BLOOD GAS, ARTERIAL
Acid-Base Excess: 0.7 mmol/L (ref 0.0–2.0)
Bicarbonate: 24.6 mmol/L (ref 20.0–28.0)
FIO2: 21
O2 Saturation: 92.6 %
Patient temperature: 37.2
pCO2 arterial: 38 mmHg (ref 32.0–48.0)
pH, Arterial: 7.427 (ref 7.350–7.450)
pO2, Arterial: 63.9 mmHg — ABNORMAL LOW (ref 83.0–108.0)

## 2020-07-03 LAB — PLATELET COUNT: Platelets: 117 10*3/uL — ABNORMAL LOW (ref 150–400)

## 2020-07-03 LAB — PROTIME-INR
INR: 1.3 — ABNORMAL HIGH (ref 0.8–1.2)
INR: 1.5 — ABNORMAL HIGH (ref 0.8–1.2)
Prothrombin Time: 16.1 seconds — ABNORMAL HIGH (ref 11.4–15.2)
Prothrombin Time: 17.9 seconds — ABNORMAL HIGH (ref 11.4–15.2)

## 2020-07-03 LAB — MAGNESIUM: Magnesium: 3.1 mg/dL — ABNORMAL HIGH (ref 1.7–2.4)

## 2020-07-03 LAB — HEPARIN LEVEL (UNFRACTIONATED): Heparin Unfractionated: 0.35 IU/mL (ref 0.30–0.70)

## 2020-07-03 LAB — HEMOGLOBIN AND HEMATOCRIT, BLOOD
HCT: 29.3 % — ABNORMAL LOW (ref 39.0–52.0)
Hemoglobin: 9.8 g/dL — ABNORMAL LOW (ref 13.0–17.0)

## 2020-07-03 LAB — APTT: aPTT: 33 seconds (ref 24–36)

## 2020-07-03 IMAGING — DX DG CHEST 1V PORT
1 series · 1 of 1 positions shown · non-contrast
Comparison: [DATE]

CLINICAL DATA: Evaluate for pneumothorax

EXAM:
PORTABLE CHEST 1 VIEW

[chest ap]
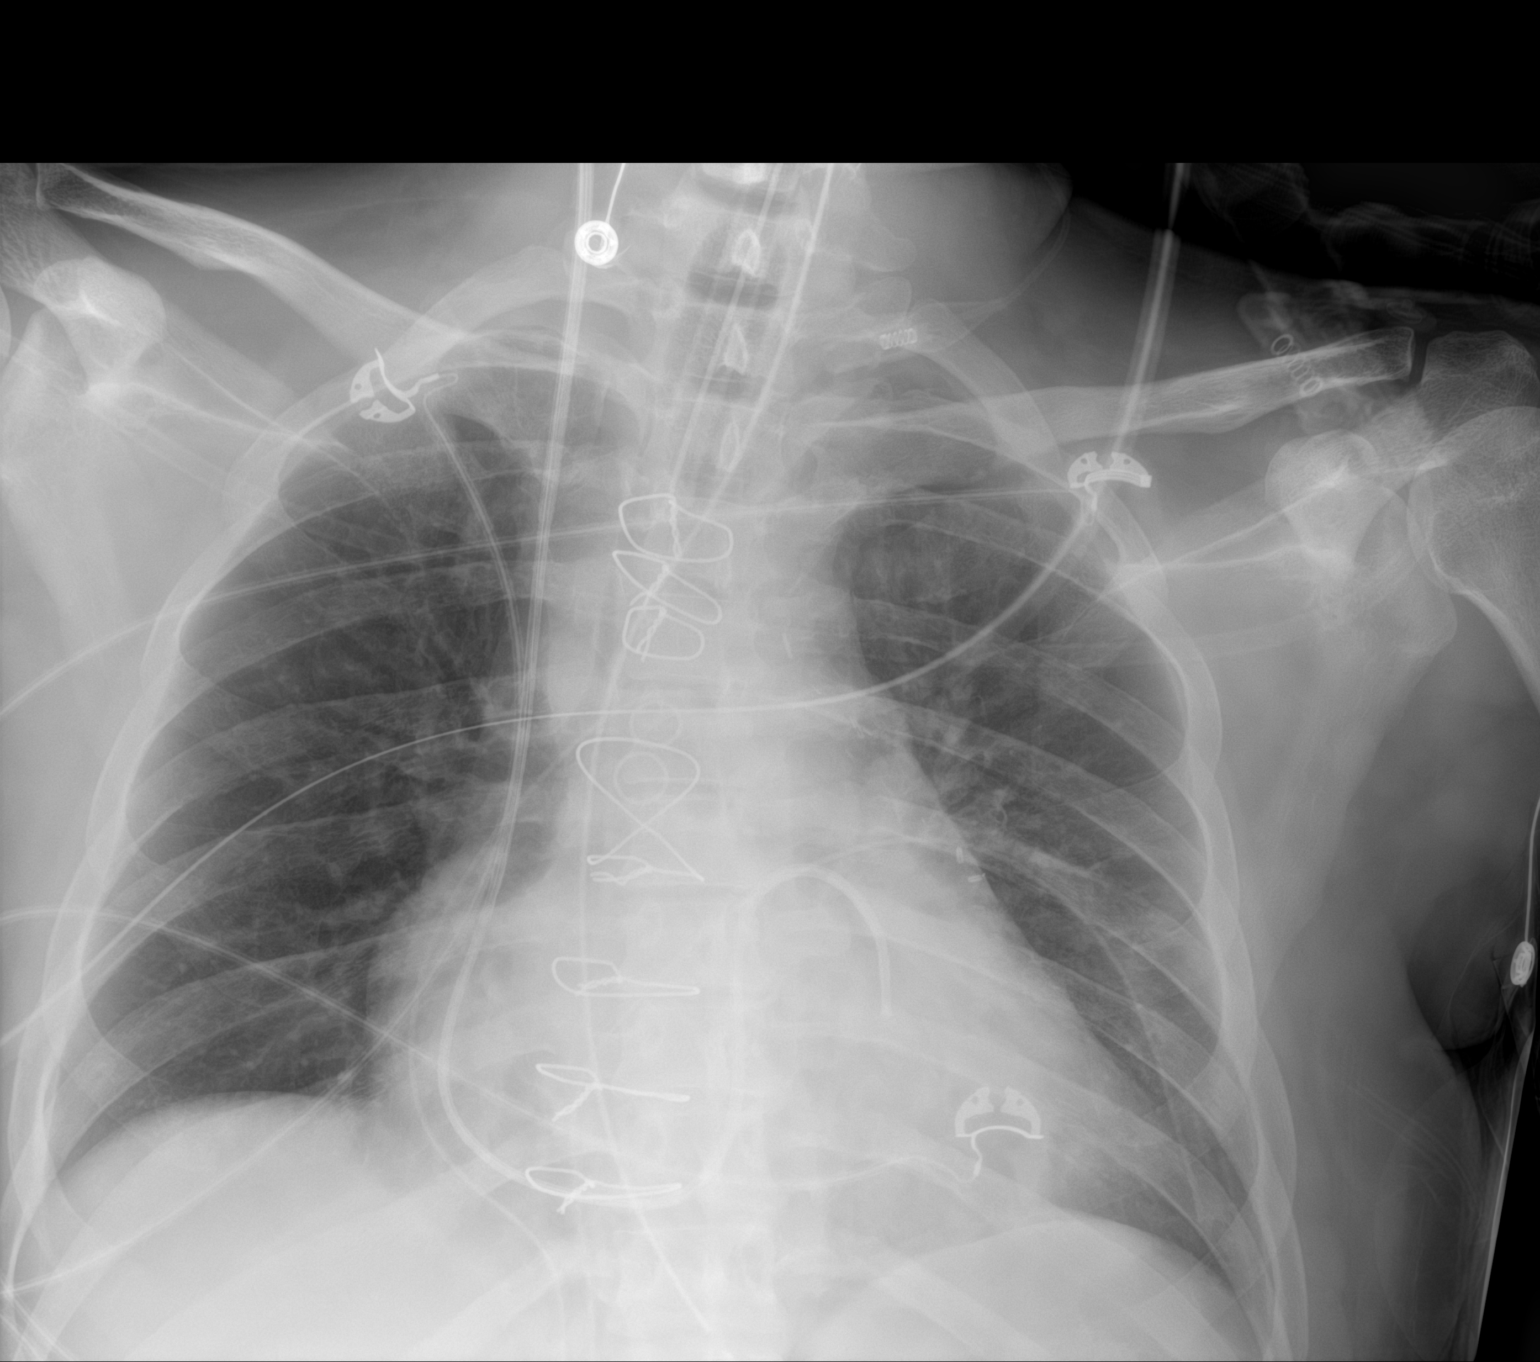

[1 of 1 positions shown; findings below may reference images not displayed]

FINDINGS: Cardiac shadow is mildly prominent but accentuated by the portable
technique. Postsurgical changes are now seen. Swan-Ganz catheter is
noted in the left pulmonary artery. Endotracheal tube and gastric
catheter are seen in satisfactory position. Mediastinal drain is
noted. Left thoracostomy tube is noted as well. No focal infiltrate
or effusion is seen. No pneumothorax is noted.
IMPRESSION: Postsurgical change with tubes and lines as described. No
pneumothorax is identified.

## 2020-07-03 SURGERY — CORONARY ARTERY BYPASS GRAFTING (CABG)
Anesthesia: General | Site: Esophagus | Laterality: Right

## 2020-07-03 MED ORDER — CHLORHEXIDINE GLUCONATE CLOTH 2 % EX PADS
6.0000 | MEDICATED_PAD | Freq: Every day | CUTANEOUS | Status: DC
  Administered 2020-07-03 – 2020-07-10 (×6): 6 via TOPICAL

## 2020-07-03 MED ORDER — POTASSIUM CHLORIDE 10 MEQ/50ML IV SOLN
10.0000 meq | INTRAVENOUS | Status: AC
Start: 2020-07-03 — End: 2020-07-03
  Administered 2020-07-03 (×3): 10 meq via INTRAVENOUS

## 2020-07-03 MED ORDER — FENTANYL CITRATE (PF) 250 MCG/5ML IJ SOLN
INTRAMUSCULAR | Status: DC | PRN
  Administered 2020-07-03 (×2): 100 ug via INTRAVENOUS
  Administered 2020-07-03: 500 ug via INTRAVENOUS
  Administered 2020-07-03 (×2): 50 ug via INTRAVENOUS
  Administered 2020-07-03 (×3): 100 ug via INTRAVENOUS

## 2020-07-03 MED ORDER — MIDAZOLAM HCL (PF) 10 MG/2ML IJ SOLN
INTRAMUSCULAR | Status: AC
  Filled 2020-07-03: qty 2

## 2020-07-03 MED ORDER — LIDOCAINE 2% (20 MG/ML) 5 ML SYRINGE
INTRAMUSCULAR | Status: AC
  Filled 2020-07-03: qty 5

## 2020-07-03 MED ORDER — HEPARIN SODIUM (PORCINE) 1000 UNIT/ML IJ SOLN
INTRAMUSCULAR | Status: DC | PRN
  Administered 2020-07-03: 26000 [IU] via INTRAVENOUS

## 2020-07-03 MED ORDER — METOPROLOL TARTRATE 25 MG/10 ML ORAL SUSPENSION: 12.5000 mg | Freq: Two times a day (BID) | ORAL | Status: DC

## 2020-07-03 MED ORDER — MIDAZOLAM HCL 2 MG/2ML IJ SOLN
2.0000 mg | INTRAMUSCULAR | Status: DC | PRN
  Administered 2020-07-03 (×2): 2 mg via INTRAVENOUS
  Filled 2020-07-03 (×2): qty 2

## 2020-07-03 MED ORDER — LACTATED RINGERS IV SOLN: INTRAVENOUS | Status: DC

## 2020-07-03 MED ORDER — DEXTROSE 50 % IV SOLN
0.0000 mL | INTRAVENOUS | Status: DC | PRN
  Filled 2020-07-03: qty 50

## 2020-07-03 MED ORDER — AMIODARONE HCL IN DEXTROSE 360-4.14 MG/200ML-% IV SOLN
30.0000 mg/h | INTRAVENOUS | Status: DC
  Filled 2020-07-03: qty 200

## 2020-07-03 MED ORDER — SODIUM CHLORIDE 0.45 % IV SOLN: INTRAVENOUS | Status: DC | PRN

## 2020-07-03 MED ORDER — ASPIRIN 81 MG PO CHEW
324.0000 mg | CHEWABLE_TABLET | Freq: Every day | ORAL | Status: DC
  Filled 2020-07-03: qty 4

## 2020-07-03 MED ORDER — INSULIN REGULAR(HUMAN) IN NACL 100-0.9 UT/100ML-% IV SOLN
INTRAVENOUS | Status: DC
  Administered 2020-07-03: 1.1 [IU]/h via INTRAVENOUS

## 2020-07-03 MED ORDER — LABETALOL HCL 5 MG/ML IV SOLN
INTRAVENOUS | Status: AC
  Filled 2020-07-03: qty 4

## 2020-07-03 MED ORDER — PLASMA-LYTE 148 IV SOLN
INTRAVENOUS | Status: DC | PRN
  Administered 2020-07-03: 200 mL via INTRAVASCULAR

## 2020-07-03 MED ORDER — BISACODYL 5 MG PO TBEC
10.0000 mg | DELAYED_RELEASE_TABLET | Freq: Every day | ORAL | Status: DC
  Administered 2020-07-04 – 2020-07-09 (×5): 10 mg via ORAL
  Filled 2020-07-03 (×6): qty 2

## 2020-07-03 MED ORDER — LACTATED RINGERS IV SOLN: INTRAVENOUS | Status: DC | PRN

## 2020-07-03 MED ORDER — TRAMADOL HCL 50 MG PO TABS
50.0000 mg | ORAL_TABLET | ORAL | Status: DC | PRN
  Administered 2020-07-04 – 2020-07-06 (×8): 100 mg via ORAL
  Administered 2020-07-07 – 2020-07-08 (×2): 50 mg via ORAL
  Administered 2020-07-08 – 2020-07-09 (×4): 100 mg via ORAL
  Filled 2020-07-03 (×10): qty 2
  Filled 2020-07-03 (×2): qty 1
  Filled 2020-07-03 (×3): qty 2

## 2020-07-03 MED ORDER — ALBUMIN HUMAN 5 % IV SOLN: INTRAVENOUS | Status: DC | PRN

## 2020-07-03 MED ORDER — PANTOPRAZOLE SODIUM 40 MG PO TBEC
40.0000 mg | DELAYED_RELEASE_TABLET | Freq: Every day | ORAL | Status: DC
  Administered 2020-07-05 – 2020-07-10 (×6): 40 mg via ORAL
  Filled 2020-07-03 (×6): qty 1

## 2020-07-03 MED ORDER — CHLORHEXIDINE GLUCONATE 0.12 % MT SOLN
15.0000 mL | OROMUCOSAL | Status: AC
  Administered 2020-07-03: 15 mL via OROMUCOSAL

## 2020-07-03 MED ORDER — HEPARIN SODIUM (PORCINE) 1000 UNIT/ML IJ SOLN
INTRAMUSCULAR | Status: AC
  Filled 2020-07-03: qty 1

## 2020-07-03 MED ORDER — ACETAMINOPHEN 160 MG/5ML PO SOLN
1000.0000 mg | Freq: Four times a day (QID) | ORAL | Status: DC
  Administered 2020-07-03: 1000 mg
  Filled 2020-07-03: qty 40.6

## 2020-07-03 MED ORDER — BISACODYL 10 MG RE SUPP: 10.0000 mg | Freq: Every day | RECTAL | Status: DC

## 2020-07-03 MED ORDER — SODIUM CHLORIDE 0.9% FLUSH
3.0000 mL | Freq: Two times a day (BID) | INTRAVENOUS | Status: DC
  Administered 2020-07-04 – 2020-07-10 (×11): 3 mL via INTRAVENOUS

## 2020-07-03 MED ORDER — ORAL CARE MOUTH RINSE
15.0000 mL | OROMUCOSAL | Status: DC
  Administered 2020-07-03 – 2020-07-04 (×4): 15 mL via OROMUCOSAL

## 2020-07-03 MED ORDER — PROPOFOL 10 MG/ML IV BOLUS
INTRAVENOUS | Status: DC | PRN
  Administered 2020-07-03: 160 mg via INTRAVENOUS

## 2020-07-03 MED ORDER — PROTAMINE SULFATE 10 MG/ML IV SOLN
INTRAVENOUS | Status: DC | PRN
  Administered 2020-07-03: 260 mg via INTRAVENOUS

## 2020-07-03 MED ORDER — DOBUTAMINE IN D5W 4-5 MG/ML-% IV SOLN
2.5000 ug/kg/min | INTRAVENOUS | Status: DC
  Administered 2020-07-03: 5 ug/kg/min via INTRAVENOUS
  Administered 2020-07-05: 7.5 ug/kg/min via INTRAVENOUS
  Filled 2020-07-03: qty 250

## 2020-07-03 MED ORDER — NOREPINEPHRINE 4 MG/250ML-% IV SOLN: 0.0000 ug/min | INTRAVENOUS | Status: DC

## 2020-07-03 MED ORDER — NITROGLYCERIN IN D5W 200-5 MCG/ML-% IV SOLN
0.0000 ug/min | INTRAVENOUS | Status: DC
  Administered 2020-07-03: 3 ug/min via INTRAVENOUS

## 2020-07-03 MED ORDER — LACTATED RINGERS IV SOLN: 500.0000 mL | Freq: Once | INTRAVENOUS | Status: DC | PRN

## 2020-07-03 MED ORDER — ROCURONIUM BROMIDE 10 MG/ML (PF) SYRINGE
PREFILLED_SYRINGE | INTRAVENOUS | Status: AC
  Filled 2020-07-03: qty 30

## 2020-07-03 MED ORDER — 0.9 % SODIUM CHLORIDE (POUR BTL) OPTIME
TOPICAL | Status: DC | PRN
  Administered 2020-07-03: 3000 mL

## 2020-07-03 MED ORDER — MIDAZOLAM HCL (PF) 5 MG/ML IJ SOLN
INTRAMUSCULAR | Status: DC | PRN
  Administered 2020-07-03: 2 mg via INTRAVENOUS
  Administered 2020-07-03: 1 mg via INTRAVENOUS
  Administered 2020-07-03: 4 mg via INTRAVENOUS
  Administered 2020-07-03: 3 mg via INTRAVENOUS

## 2020-07-03 MED ORDER — HEMOSTATIC AGENTS (NO CHARGE) OPTIME
TOPICAL | Status: DC | PRN
  Administered 2020-07-03: 2 via TOPICAL

## 2020-07-03 MED ORDER — DOCUSATE SODIUM 100 MG PO CAPS
200.0000 mg | ORAL_CAPSULE | Freq: Every day | ORAL | Status: DC
  Administered 2020-07-04 – 2020-07-07 (×4): 200 mg via ORAL
  Filled 2020-07-03 (×6): qty 2

## 2020-07-03 MED ORDER — ALBUMIN HUMAN 5 % IV SOLN
12.5000 g | Freq: Once | INTRAVENOUS | Status: AC
  Administered 2020-07-03: 12.5 g via INTRAVENOUS

## 2020-07-03 MED ORDER — SODIUM CHLORIDE 0.9 % IV SOLN: INTRAVENOUS | Status: DC

## 2020-07-03 MED ORDER — LIDOCAINE 2% (20 MG/ML) 5 ML SYRINGE
INTRAMUSCULAR | Status: DC | PRN
  Administered 2020-07-03: 80 mg via INTRAVENOUS

## 2020-07-03 MED ORDER — ACETAMINOPHEN 500 MG PO TABS
1000.0000 mg | ORAL_TABLET | Freq: Four times a day (QID) | ORAL | Status: AC
  Administered 2020-07-04 – 2020-07-08 (×17): 1000 mg via ORAL
  Filled 2020-07-03 (×16): qty 2

## 2020-07-03 MED ORDER — AMIODARONE HCL IN DEXTROSE 360-4.14 MG/200ML-% IV SOLN: 30.0000 mg/h | INTRAVENOUS | Status: DC

## 2020-07-03 MED ORDER — MAGNESIUM SULFATE 4 GM/100ML IV SOLN
4.0000 g | Freq: Once | INTRAVENOUS | Status: AC
  Administered 2020-07-03: 4 g via INTRAVENOUS
  Filled 2020-07-03: qty 100

## 2020-07-03 MED ORDER — AMIODARONE LOAD VIA INFUSION
150.0000 mg | INTRAVENOUS | Status: DC
  Filled 2020-07-03: qty 83.34

## 2020-07-03 MED ORDER — AMIODARONE HCL IN DEXTROSE 360-4.14 MG/200ML-% IV SOLN
60.0000 mg/h | INTRAVENOUS | Status: DC
  Administered 2020-07-03: 30 mg/h via INTRAVENOUS
  Filled 2020-07-03: qty 200

## 2020-07-03 MED ORDER — CEFAZOLIN SODIUM-DEXTROSE 2-4 GM/100ML-% IV SOLN
2.0000 g | Freq: Three times a day (TID) | INTRAVENOUS | Status: AC
  Administered 2020-07-03 – 2020-07-05 (×6): 2 g via INTRAVENOUS
  Filled 2020-07-03 (×7): qty 100

## 2020-07-03 MED ORDER — PHENYLEPHRINE HCL-NACL 20-0.9 MG/250ML-% IV SOLN
0.0000 ug/min | INTRAVENOUS | Status: DC
  Administered 2020-07-03: 25 ug/min via INTRAVENOUS
  Administered 2020-07-04 (×2): 50 ug/min via INTRAVENOUS
  Filled 2020-07-03 (×2): qty 250

## 2020-07-03 MED ORDER — PROPOFOL 10 MG/ML IV BOLUS
INTRAVENOUS | Status: AC
  Filled 2020-07-03: qty 20

## 2020-07-03 MED ORDER — METOPROLOL TARTRATE 5 MG/5ML IV SOLN
2.5000 mg | INTRAVENOUS | Status: DC | PRN
Start: 2020-07-03 — End: 2020-07-08

## 2020-07-03 MED ORDER — METOPROLOL TARTRATE 12.5 MG HALF TABLET
12.5000 mg | ORAL_TABLET | Freq: Two times a day (BID) | ORAL | Status: DC
  Administered 2020-07-04 – 2020-07-07 (×5): 12.5 mg via ORAL
  Filled 2020-07-03 (×7): qty 1

## 2020-07-03 MED ORDER — NICARDIPINE HCL IN NACL 20-0.86 MG/200ML-% IV SOLN
2.5000 mg/h | INTRAVENOUS | Status: DC
Start: 2020-07-03 — End: 2020-07-05
  Filled 2020-07-03: qty 200

## 2020-07-03 MED ORDER — FENTANYL CITRATE (PF) 250 MCG/5ML IJ SOLN
INTRAMUSCULAR | Status: AC
  Filled 2020-07-03: qty 25

## 2020-07-03 MED ORDER — DOBUTAMINE IN D5W 4-5 MG/ML-% IV SOLN
2.5000 ug/kg/min | INTRAVENOUS | Status: DC
  Administered 2020-07-03: 2.5 ug/kg/min via INTRAVENOUS
  Filled 2020-07-03: qty 250

## 2020-07-03 MED ORDER — ASPIRIN EC 325 MG PO TBEC
325.0000 mg | DELAYED_RELEASE_TABLET | Freq: Every day | ORAL | Status: DC
  Administered 2020-07-04 – 2020-07-10 (×7): 325 mg via ORAL
  Filled 2020-07-03 (×7): qty 1

## 2020-07-03 MED ORDER — FAMOTIDINE IN NACL 20-0.9 MG/50ML-% IV SOLN
20.0000 mg | Freq: Two times a day (BID) | INTRAVENOUS | Status: AC
  Administered 2020-07-03 (×2): 20 mg via INTRAVENOUS
  Filled 2020-07-03 (×2): qty 50

## 2020-07-03 MED ORDER — SODIUM CHLORIDE 0.9 % IV SOLN: INTRAVENOUS | Status: DC | PRN

## 2020-07-03 MED ORDER — SODIUM CHLORIDE 0.9% FLUSH: 10.0000 mL | INTRAVENOUS | Status: DC | PRN

## 2020-07-03 MED ORDER — ACETAMINOPHEN 650 MG RE SUPP: 650.0000 mg | Freq: Once | RECTAL | Status: AC

## 2020-07-03 MED ORDER — DEXMEDETOMIDINE HCL IN NACL 400 MCG/100ML IV SOLN
0.0000 ug/kg/h | INTRAVENOUS | Status: DC
Start: 2020-07-03 — End: 2020-07-05
  Administered 2020-07-03: 0.2 ug/kg/h via INTRAVENOUS
  Filled 2020-07-03: qty 100

## 2020-07-03 MED ORDER — SODIUM CHLORIDE 0.9 % IV SOLN
250.0000 mL | INTRAVENOUS | Status: DC
  Administered 2020-07-04: 250 mL via INTRAVENOUS

## 2020-07-03 MED ORDER — MORPHINE SULFATE (PF) 2 MG/ML IV SOLN
1.0000 mg | INTRAVENOUS | Status: DC | PRN
  Administered 2020-07-04: 1 mg via INTRAVENOUS
  Administered 2020-07-04: 4 mg via INTRAVENOUS
  Administered 2020-07-04: 2 mg via INTRAVENOUS
  Administered 2020-07-04 (×2): 4 mg via INTRAVENOUS
  Administered 2020-07-04 – 2020-07-05 (×7): 2 mg via INTRAVENOUS
  Administered 2020-07-05: 4 mg via INTRAVENOUS
  Administered 2020-07-05 – 2020-07-08 (×3): 2 mg via INTRAVENOUS
  Filled 2020-07-03 (×5): qty 1
  Filled 2020-07-03 (×2): qty 2
  Filled 2020-07-03 (×3): qty 1
  Filled 2020-07-03: qty 2
  Filled 2020-07-03 (×4): qty 1
  Filled 2020-07-03: qty 2

## 2020-07-03 MED ORDER — SODIUM CHLORIDE 0.9% FLUSH: 3.0000 mL | INTRAVENOUS | Status: DC | PRN

## 2020-07-03 MED ORDER — ROCURONIUM BROMIDE 10 MG/ML (PF) SYRINGE
PREFILLED_SYRINGE | INTRAVENOUS | Status: DC | PRN
  Administered 2020-07-03 (×2): 50 mg via INTRAVENOUS
  Administered 2020-07-03: 100 mg via INTRAVENOUS

## 2020-07-03 MED ORDER — SODIUM CHLORIDE 0.9% FLUSH
10.0000 mL | Freq: Two times a day (BID) | INTRAVENOUS | Status: DC
Start: 2020-07-03 — End: 2020-07-10
  Administered 2020-07-03 – 2020-07-10 (×13): 10 mL

## 2020-07-03 MED ORDER — CHLORHEXIDINE GLUCONATE 0.12% ORAL RINSE (MEDLINE KIT)
15.0000 mL | Freq: Two times a day (BID) | OROMUCOSAL | Status: DC
  Administered 2020-07-03: 15 mL via OROMUCOSAL

## 2020-07-03 MED ORDER — VANCOMYCIN HCL IN DEXTROSE 1-5 GM/200ML-% IV SOLN
1000.0000 mg | Freq: Once | INTRAVENOUS | Status: AC
  Administered 2020-07-03: 1000 mg via INTRAVENOUS
  Filled 2020-07-03: qty 200

## 2020-07-03 MED ORDER — LABETALOL HCL 5 MG/ML IV SOLN
INTRAVENOUS | Status: DC | PRN
  Administered 2020-07-03: 10 mg via INTRAVENOUS

## 2020-07-03 MED ORDER — INSULIN ASPART 100 UNIT/ML IJ SOLN
0.0000 [IU] | INTRAMUSCULAR | Status: DC
  Administered 2020-07-03 – 2020-07-04 (×2): 2 [IU] via SUBCUTANEOUS

## 2020-07-03 MED ORDER — ONDANSETRON HCL 4 MG/2ML IJ SOLN: 4.0000 mg | Freq: Four times a day (QID) | INTRAMUSCULAR | Status: DC | PRN

## 2020-07-03 MED ORDER — ALBUMIN HUMAN 5 % IV SOLN
250.0000 mL | INTRAVENOUS | Status: AC | PRN
Start: 2020-07-03 — End: 2020-07-04
  Administered 2020-07-03 (×4): 12.5 g via INTRAVENOUS
  Filled 2020-07-03 (×2): qty 250

## 2020-07-03 MED ORDER — ACETAMINOPHEN 160 MG/5ML PO SOLN
650.0000 mg | Freq: Once | ORAL | Status: AC
  Administered 2020-07-03: 650 mg

## 2020-07-03 MED ORDER — PROTAMINE SULFATE 10 MG/ML IV SOLN
INTRAVENOUS | Status: AC
  Filled 2020-07-03: qty 25

## 2020-07-03 MED ORDER — PHENYLEPHRINE 40 MCG/ML (10ML) SYRINGE FOR IV PUSH (FOR BLOOD PRESSURE SUPPORT)
PREFILLED_SYRINGE | INTRAVENOUS | Status: AC
  Filled 2020-07-03: qty 10

## 2020-07-03 MED ORDER — AMIODARONE HCL IN DEXTROSE 360-4.14 MG/200ML-% IV SOLN
30.0000 mg/h | INTRAVENOUS | Status: DC
  Administered 2020-07-03 – 2020-07-04 (×2): 30 mg/h via INTRAVENOUS

## 2020-07-03 SURGICAL SUPPLY — 106 items
ADH SKN CLS APL DERMABOND .7 (GAUZE/BANDAGES/DRESSINGS) ×1
APPLIER CLIP 9.375 SM OPEN (CLIP) ×4
APR CLP SM 9.3 20 MLT OPN (CLIP) ×3
BAG DECANTER FOR FLEXI CONT (MISCELLANEOUS) ×4 IMPLANT
BLADE CLIPPER SURG (BLADE) ×8 IMPLANT
BLADE STERNUM SYSTEM 6 (BLADE) ×4 IMPLANT
BLADE SURG 11 STRL SS (BLADE) ×4 IMPLANT
BLADE SURG 15 STRL LF DISP TIS (BLADE) ×3 IMPLANT
BLADE SURG 15 STRL SS (BLADE) ×4
BNDG ELASTIC 4X5.8 VLCR STR LF (GAUZE/BANDAGES/DRESSINGS) ×8 IMPLANT
BNDG ELASTIC 6X5.8 VLCR STR LF (GAUZE/BANDAGES/DRESSINGS) ×8 IMPLANT
BNDG GAUZE ELAST 4 BULKY (GAUZE/BANDAGES/DRESSINGS) ×8 IMPLANT
CABLE SURGICAL S-101-97-12 (CABLE) ×4 IMPLANT
CANISTER SUCT 3000ML PPV (MISCELLANEOUS) ×4 IMPLANT
CANNULA MC2 2 STG 29/37 NON-V (CANNULA) ×3 IMPLANT
CANNULA MC2 TWO STAGE (CANNULA) ×4
CANNULA NON VENT 20FR 12 (CANNULA) ×4 IMPLANT
CATH ROBINSON RED A/P 18FR (CATHETERS) ×8 IMPLANT
CLIP APPLIE 9.375 SM OPEN (CLIP) ×3 IMPLANT
CLIP RETRACTION 3.0MM CORONARY (MISCELLANEOUS) ×4 IMPLANT
CLIP VESOCCLUDE MED 24/CT (CLIP) IMPLANT
CLIP VESOCCLUDE SM WIDE 24/CT (CLIP) IMPLANT
CONN ST 1/2X1/2  BEN (MISCELLANEOUS) ×4
CONN ST 1/2X1/2 BEN (MISCELLANEOUS) ×3 IMPLANT
CONNECTOR BLAKE 2:1 CARIO BLK (MISCELLANEOUS) ×4 IMPLANT
CONTAINER PROTECT SURGISLUSH (MISCELLANEOUS) ×4 IMPLANT
COVER MAYO STAND STRL (DRAPES) ×8 IMPLANT
CUFF TOURN SGL QUICK 18X4 (TOURNIQUET CUFF) IMPLANT
CUFF TOURN SGL QUICK 24 (TOURNIQUET CUFF)
CUFF TRNQT CYL 24X4X16.5-23 (TOURNIQUET CUFF) IMPLANT
DERMABOND ADVANCED (GAUZE/BANDAGES/DRESSINGS) ×1
DERMABOND ADVANCED .7 DNX12 (GAUZE/BANDAGES/DRESSINGS) ×3 IMPLANT
DRAIN CHANNEL 19F RND (DRAIN) ×12 IMPLANT
DRAIN CONNECTOR BLAKE 1:1 (MISCELLANEOUS) ×4 IMPLANT
DRAPE CARDIOVASCULAR INCISE (DRAPES) ×4
DRAPE EXTREMITY T 121X128X90 (DISPOSABLE) ×4 IMPLANT
DRAPE HALF SHEET 40X57 (DRAPES) ×4 IMPLANT
DRAPE INCISE IOBAN 66X45 STRL (DRAPES) IMPLANT
DRAPE SRG 135X102X78XABS (DRAPES) ×3 IMPLANT
DRAPE WARM FLUID 44X44 (DRAPES) ×4 IMPLANT
DRSG AQUACEL AG ADV 3.5X10 (GAUZE/BANDAGES/DRESSINGS) ×4 IMPLANT
DRSG AQUACEL AG ADV 3.5X14 (GAUZE/BANDAGES/DRESSINGS) ×4 IMPLANT
DRSG COVADERM 4X14 (GAUZE/BANDAGES/DRESSINGS) ×4 IMPLANT
ELECT BLADE 4.0 EZ CLEAN MEGAD (MISCELLANEOUS) ×4
ELECT REM PT RETURN 9FT ADLT (ELECTROSURGICAL) ×8
ELECTRODE BLDE 4.0 EZ CLN MEGD (MISCELLANEOUS) ×3 IMPLANT
ELECTRODE REM PT RTRN 9FT ADLT (ELECTROSURGICAL) ×6 IMPLANT
FELT TEFLON 1X6 (MISCELLANEOUS) ×8 IMPLANT
GAUZE SPONGE 4X4 12PLY STRL (GAUZE/BANDAGES/DRESSINGS) ×8 IMPLANT
GAUZE SPONGE 4X4 12PLY STRL LF (GAUZE/BANDAGES/DRESSINGS) ×12 IMPLANT
GEL ULTRASOUND 20GR AQUASONIC (MISCELLANEOUS) ×4 IMPLANT
GLOVE BIOGEL M STRL SZ7.5 (GLOVE) ×8 IMPLANT
GOWN STRL REUS W/ TWL LRG LVL3 (GOWN DISPOSABLE) ×12 IMPLANT
GOWN STRL REUS W/ TWL XL LVL3 (GOWN DISPOSABLE) ×6 IMPLANT
GOWN STRL REUS W/TWL LRG LVL3 (GOWN DISPOSABLE) ×16
GOWN STRL REUS W/TWL XL LVL3 (GOWN DISPOSABLE) ×8
HEMOSTAT POWDER SURGIFOAM 1G (HEMOSTASIS) ×12 IMPLANT
INSERT FOGARTY XLG (MISCELLANEOUS) ×4 IMPLANT
INSERT SUTURE HOLDER (MISCELLANEOUS) ×4 IMPLANT
KIT BASIN OR (CUSTOM PROCEDURE TRAY) ×4 IMPLANT
KIT MICROPUNCTURE NIT STIFF (SHEATH) ×4 IMPLANT
KIT SUCTION CATH 14FR (SUCTIONS) ×4 IMPLANT
KIT TURNOVER KIT B (KITS) ×8 IMPLANT
KIT VASOVIEW HEMOPRO 2 VH 4000 (KITS) ×4 IMPLANT
LEAD PACING MYOCARDI (MISCELLANEOUS) ×4 IMPLANT
MARKER GRAFT CORONARY BYPASS (MISCELLANEOUS) ×16 IMPLANT
NS IRRIG 1000ML POUR BTL (IV SOLUTION) ×20 IMPLANT
PACK ACCESSORY CANNULA KIT (KITS) ×4 IMPLANT
PACK E OPEN HEART (SUTURE) ×4 IMPLANT
PACK OPEN HEART (CUSTOM PROCEDURE TRAY) ×4 IMPLANT
PAD ARMBOARD 7.5X6 YLW CONV (MISCELLANEOUS) ×16 IMPLANT
PAD ELECT DEFIB RADIOL ZOLL (MISCELLANEOUS) ×4 IMPLANT
PENCIL BUTTON HOLSTER BLD 10FT (ELECTRODE) ×4 IMPLANT
POSITIONER HEAD DONUT 9IN (MISCELLANEOUS) ×4 IMPLANT
PUNCH AORTIC ROTATE 4.0MM (MISCELLANEOUS) ×4 IMPLANT
SET CARDIOPLEGIA MPS 5001102 (MISCELLANEOUS) ×4 IMPLANT
SHEARS HARMONIC 9CM CVD (BLADE) ×4 IMPLANT
SUPPORT HEART JANKE-BARRON (MISCELLANEOUS) ×4 IMPLANT
SUT BONE WAX W31G (SUTURE) ×4 IMPLANT
SUT ETHIBOND X763 2 0 SH 1 (SUTURE) ×8 IMPLANT
SUT MNCRL AB 3-0 PS2 18 (SUTURE) ×8 IMPLANT
SUT MNCRL AB 4-0 PS2 18 (SUTURE) ×8 IMPLANT
SUT PDS AB 1 CTX 36 (SUTURE) ×8 IMPLANT
SUT PROLENE 4 0 SH DA (SUTURE) ×4 IMPLANT
SUT PROLENE 5 0 C 1 36 (SUTURE) ×12 IMPLANT
SUT PROLENE 7 0 BV1 MDA (SUTURE) ×8 IMPLANT
SUT STEEL 6MS V (SUTURE) ×8 IMPLANT
SUT VIC AB 2-0 CT1 27 (SUTURE) ×4
SUT VIC AB 2-0 CT1 TAPERPNT 27 (SUTURE) ×3 IMPLANT
SUT VIC AB 3-0 SH 27 (SUTURE)
SUT VIC AB 3-0 SH 27X BRD (SUTURE) IMPLANT
SUT VIC AB 3-0 X1 27 (SUTURE) IMPLANT
SYR 50ML SLIP (SYRINGE) IMPLANT
SYSTEM SAHARA CHEST DRAIN ATS (WOUND CARE) ×4 IMPLANT
TAPE PAPER 3X10 WHT MICROPORE (GAUZE/BANDAGES/DRESSINGS) ×8 IMPLANT
TOWEL GREEN STERILE (TOWEL DISPOSABLE) ×8 IMPLANT
TOWEL GREEN STERILE FF (TOWEL DISPOSABLE) ×8 IMPLANT
TRAY CATH LUMEN 1 20CM STRL (SET/KITS/TRAYS/PACK) ×4 IMPLANT
TRAY FOLEY SLVR 16FR TEMP STAT (SET/KITS/TRAYS/PACK) ×4 IMPLANT
TUBING ART PRESS 48 MALE/FEM (TUBING) ×4 IMPLANT
TUBING ART PRESS 72  MALE/FEM (TUBING) ×8
TUBING ART PRESS 72 MALE/FEM (TUBING) ×6 IMPLANT
TUBING INSUFFLATION (TUBING) ×4 IMPLANT
TUBING LAP HI FLOW INSUFFLATIO (TUBING) ×4 IMPLANT
UNDERPAD 30X36 HEAVY ABSORB (UNDERPADS AND DIAPERS) ×8 IMPLANT
WATER STERILE IRR 1000ML POUR (IV SOLUTION) ×8 IMPLANT

## 2020-07-03 NOTE — Brief Op Note (Signed)
06/29/2020 - 07/03/2020  12:56 PM  PATIENT:  Wyatt Donovan  35 y.o. male  PRE-OPERATIVE DIAGNOSIS:  1. CAD 2. Small thrombus near the apex of the LV  POST-OPERATIVE DIAGNOSIS:  1. CAD 2. Small thrombus near the apex of the LV  PROCEDURE:  TRANSESOPHAGEAL ECHOCARDIOGRAM (TEE), CORONARY ARTERY BYPASS GRAFTING (CABG) x 4 (LIMA to LAD, SVG to DIAGONAL, RIGHT RADIAL ARTERY to OM, SVG to PDA) with OPEN RIGHT RADIAL ARTERY HARVEST and EVH of RIGHT THIGH GREATER SAPHENOUS VEIN RIGHT RADIAL ARTERY HARVEST TIME: 40 minutes;RIGHT RADIAL ARTERY PREP TIME: 8 minutes RIGHT EVH HARVEST TIME: 22 minutes; RIGHT EVH PREP TIME: 12 mintues  SURGEON:  Surgeon(s) and Role:    Lightfoot, Lucile Crater, MD - Primary  PHYSICIAN ASSISTANT: Lars Pinks PA-C  ANESTHESIA:   general  EBL: Per perfusion, anesthesia records  DRAINS: Chest tubes placed in the mediastinal and pleural spaces   COUNTS CORRECT:  YES  DICTATION: .Dragon Dictation  PLAN OF CARE: Admit to inpatient   PATIENT DISPOSITION:  ICU - intubated and hemodynamically stable.   Delay start of Pharmacological VTE agent (>24hrs) due to surgical blood loss or risk of bleeding: yes  BASELINE WEIGHT: 77.7 kg

## 2020-07-03 NOTE — Anesthesia Procedure Notes (Signed)
Arterial Line Insertion °Performed by: CRNA ° Preanesthetic checklist: patient identified, IV checked, site marked, risks and benefits discussed, surgical consent, monitors and equipment checked, pre-op evaluation, timeout performed and anesthesia consent °Lidocaine 1% used for infiltration and patient sedated °Left, radial was placed °Catheter size: 20 G °Hand hygiene performed , maximum sterile barriers used  and Seldinger technique used °Allen's test indicative of satisfactory collateral circulation °Attempts: 1 °Procedure performed without using ultrasound guided technique. °Following insertion, dressing applied and Biopatch. °Post procedure assessment: normal ° °Patient tolerated the procedure well with no immediate complications. ° ° ° ° °

## 2020-07-03 NOTE — Progress Notes (Signed)
  Echocardiogram Echocardiogram Transesophageal has been performed.  Wyatt Donovan 07/03/2020, 10:08 AM

## 2020-07-03 NOTE — Progress Notes (Signed)
Progress Note  Patient Name: Wyatt Donovan Date of Encounter: 07/03/2020  [facility] Cardiologist: None   Subjective   Blood-streaked nasal drainage.  No chest pain.  Denies shortness of breath.  Has been accepted at for surgery by Dr. Kipp Brood.  Patient will be high risk for surgical/postop heart failure.  Has viable anterior wall and indeterminate lateral wall (which is the current/presenting infarct related territory).  Inpatient Medications    Scheduled Meds: . [MAR Hold] aspirin  81 mg Oral Daily  . [MAR Hold] atorvastatin  40 mg Oral Daily  . epinephrine  0-10 mcg/min Intravenous To OR  . heparin-papaverine-plasmalyte irrigation   Irrigation To OR  . insulin   Intravenous To OR  . [place] Blood Cardioplegia vial (lidocaine/magnesium/mannitol 0.26g-4g-6.4g)   Intracoronary Once  . [MAR Hold] metoprolol succinate  12.5 mg Oral Daily  . phenylephrine  30-200 mcg/min Intravenous To OR  . potassium chloride  80 mEq Other To OR  . [MAR Hold] sodium chloride flush  3 mL Intravenous Q12H  . [MAR Hold] sodium chloride flush  3 mL Intravenous Q12H  . tranexamic acid  15 mg/kg Intravenous To OR  . tranexamic acid  2 mg/kg Intracatheter To OR   Continuous Infusions: . [MAR Hold] sodium chloride    .  ceFAZolin (ANCEF) IV    .  ceFAZolin (ANCEF) IV    . dexmedetomidine    . heparin 30,000 units/NS 1000 mL solution for CELLSAVER    . heparin 1,850 Units/hr (07/02/20 2346)  . milrinone    . [MAR Hold] nitroGLYCERIN 10 mcg/min (07/03/20 0700)  . nitroGLYCERIN    . norepinephrine    . tranexamic acid (CYKLOKAPRON) infusion ([facility])    . vancomycin     PRN Meds: [MAR Hold] sodium chloride, [MAR Hold] acetaminophen, [MAR Hold] HYDROcodone-acetaminophen, [MAR Hold]  HYDROmorphone (DILAUDID) injection, [MAR Hold] nitroGLYCERIN, [MAR Hold] ondansetron (ZOFRAN) IV, [MAR Hold] sodium chloride flush   Vital Signs    Vitals:   07/02/20 2000 07/02/20 2206 07/03/20 0400 07/03/20  0527  BP: 100/70 106/69 93/61 106/75  Pulse: 97 93 88 (!) 102  Resp: 17 13 10    Temp:  98.5 F (36.9 C) 98.9 F (37.2 C)   TempSrc:  Oral Oral   SpO2: 92% 95% 93%   Weight:    77.7 kg  Height:        Intake/Output Summary (Last 24 hours) at 07/03/2020 0743 Last data filed at 07/03/2020 0700 Gross per 24 hour  Intake 1152 ml  Output 1600 ml  Net -448 ml   Last 3 Weights 07/03/2020 07/02/2020 06/30/2020  Weight (lbs) 171 lb 4.8 oz 176 lb 5.9 oz 173 lb 11.6 oz  Weight (kg) 77.7 kg 80 kg 78.8 kg      Telemetry    Normal sinus rhythm with occasional PVCs.- Personally Reviewed  ECG    A new tracing has not been performed since 06/30/2020.- Personally Reviewed  Physical Exam  Terrible dentition GEN: No acute distress.   Neck: No JVD Cardiac:  Cath site is unremarkable.  RRR, no murmurs or rub.  Positive S4 gallop.Marland Kitchen  Respiratory: Clear to auscultation bilaterally. GI: Soft, nontender, non-distended  MS: No edema; No deformity. Neuro:  Nonfocal  Psych: Normal affect   Labs    High Sensitivity Troponin:   Recent Labs  Lab 06/29/20 0357 06/29/20 0549 06/29/20 1437 06/30/20 0544  TROPONINIHS 74* 366* 25,186* >27,000*      Chemistry Recent Labs  Lab 06/29/20 [phone]  07/01/20 0044 07/02/20 0838 07/03/20 0401  NA 144 137 139 139  K 4.0 4.3 4.6 3.5  CL 109 103 103 109  CO2 24 27 29 26   GLUCOSE 103* 114* 94 100*  BUN 15 8 6  <5*  CREATININE 0.96 0.99 0.89 0.85  CALCIUM 9.1 8.4* 8.5* 8.0*  PROT 6.4*  --   --  5.6*  ALBUMIN 3.6  --   --  2.3*  AST 36  --   --  59*  ALT 44  --   --  33  ALKPHOS 69  --   --  71  BILITOT 0.7  --   --  0.5  GFRNONAA >60 >60 >60 >60  ANIONGAP 11 7 7  4*     Hematology Recent Labs  Lab 07/01/20 0044 07/02/20 0838 07/03/20 0401  WBC 17.1* 11.8* 8.8  RBC 4.49 4.63 4.25  HGB 14.0 14.4 13.2  HCT 42.8 44.2 39.8  MCV 95.3 95.5 93.6  MCH 31.2 31.1 31.1  MCHC 32.7 32.6 33.2  RDW 13.1 13.3 13.5  PLT 151 151 152    BNP Recent Labs  Lab  06/29/20 0712  BNP 129.9*     DDimer No results for input(s): DDIMER in the last 168 hours.   Radiology    CARDIAC CATHETERIZATION  Result Date: 07/01/2020  Prox LAD to Mid LAD lesion is 100% stenosed.  Ost RCA to Prox RCA lesion is 100% stenosed.  Prox Cx to Mid Cx lesion is 95% stenosed.  Wyatt Donovan is a 35 y.o. male  [id number] LOCATION:  FACILITY: [facility] PHYSICIAN: Wyatt Donovan, M.D. 18-Jan-1986 DATE OF PROCEDURE:  07/01/2020 DATE OF DISCHARGE: CARDIAC CATHETERIZATION History obtained from chart review.35 y.o. male with a very early onset CAD and remote anterior STEMI with LAD stent at age 9, presenting with unstable angina, evolved to posterior wall infarction and mild reduction in overall LVEF.Echocardiogram shows a possible apical left ventricular thrombus, but this appears to be organized possibly calcified.   Wyatt Donovan has three-vessel disease.  He has an occluded LAD at the site of prior stenting with left to left and right to left collaterals as well as an occluded codominant RCA with right to LAD collaterals.  He has a 95% stenosis in the mid AV groove circumflex which is a codominant vessel just prior to the takeoff of a moderate-sized marginal branch.  LVEDP is not elevated.  He is relatively hypotensive in the Cath Lab and did receive a fluid bolus.  After review with his attending, Dr. Tamala Julian, it was decided to take a step back, to viability of the anterior wall, and consider CABG if his anterior wall is viable.  If not, he will need stenting of his mid AV groove circumflex.  Sheath was removed and a TR band was placed on the right wrist to achieve patent hemostasis.  The patient left lab in stable condition. Wyatt Donovan. MD, [facility] 07/01/2020 12:55 PM   MR CARDIAC MORPHOLOGY W WO CONTRAST  Result Date: 07/02/2020 CLINICAL DATA:  Evaluate for viability EXAM: CARDIAC MRI TECHNIQUE: The patient was scanned on a 1.5 Tesla Siemens magnet. A dedicated cardiac coil was used.  Functional imaging was done using Fiesta sequences. 2,3, and 4 chamber views were done to assess for RWMA's. Modified Simpson's rule using a short axis stack was used to calculate an ejection fraction on a dedicated work Conservation officer, nature. The patient received 8 cc of Gadavist. After 10 minutes inversion recovery sequences were used to  assess for infiltration and scar tissue. CONTRAST:  8 cc  of Gadavist FINDINGS: Left ventricle: -There was one mid ventricular short axis slice that was omitted from the short axis cine stack, which can affect calculation of volumes -Mild dilatation -Severe systolic dysfunction. Akinesis of basal to mid inferior/inferoseptal/inferolateral, mid anterolateral, and apical inferior/septal/lateral walls. -LV apical thrombus measuring 80mm x 73mm -Transmural LGE with areas of microvascular obstruction vs intramyocardial hemorrhage consistent with acute MI in the basal to mid inferoseptal/inferior/inferolateral walls and apical lateral walls. -Subendocardial LGE consistent with prior infarct in mid anteroseptal, apical septal/inferior walls and apex. LGE>50% transmural only at distal apex LV EF: 27% (Normal 56-78%) Absolute volumes: LV EDV: 219mL (Normal 77-195 mL) LV ESV: 116mL (Normal 19-72 mL) LV SV: 42mL (Normal 51-133 mL) CO: 5.5L/min (Normal 2.8-8.8 L/min) Indexed volumes: LV EDV: 193mL/sq-m (Normal 47-92 mL/sq-m) LV ESV: 86mL/sq-m (Normal 13-30 mL/sq-m) LV SV: 44mL/sq-m (Normal 32-62 mL/sq-m) CI: 2.7L/min/sq-m (Normal 1.7-4.2 L/min/sq-m) Right ventricle: Normal size and systolic function RV EF:  57% (Normal 47-74%) Absolute volumes: RV EDV: 126mL (Normal 88-227 mL) RV ESV: 68mL (Normal 23-103 mL) RV SV: 16mL (Normal 52-138 mL) CO: 6.1L/min (Normal 2.8-8.8 L/min) Indexed volumes: RV EDV: 39mL/sq-m (Normal 55-105 mL/sq-m) RV ESV: 74mL/sq-m (Normal 15-43 mL/sq-m) RV SV: 20mL/sq-m (Normal 32-64 mL/sq-m) CI: 3.0L/min/sq-m (Normal 1.7-4.2 L/min/sq-m) Left atrium: Normal size Right  atrium: Normal size Mitral valve: Mild regurgitation Aortic valve: Tricuspid.  No regurgitation Tricuspid valve: No regurgitation Pulmonic valve: No regurgitation Aorta: Normal proximal ascending aorta Pericardium: Small effusion IMPRESSION: 1. Acute MI in the in the basal to mid inferoseptal/inferior/inferolateral walls and apical lateral wall, with transmural LGE and areas of microvascular obstruction vs intramyocardial hemorrhage. Unable to assess this territory for viability in setting of acute MI 2. LAD territory appears viable. There is subendocardial LGE consistent with prior infarct in mid anteroseptal, apical septal/inferior walls and apex. LGE is greater than 50% transmural suggesting nonviability only at the very distal apex. 3.  LV apical thrombus measures 25mm x 34mm 4. Mild LV dilatation with severe systolic dysfunction (EF [id number]). Akinesis of basal to mid inferior/inferoseptal/inferolateral, mid anterolateral, and apical inferior/septal/lateral walls. 5.  Normal RV size and systolic function (EF [id number]) Electronically Signed   By: Oswaldo Milian MD   On: 07/02/2020 18:06   VAS US DOPPLER PRE CABG  Result Date: 07/02/2020 PREOPERATIVE VASCULAR EVALUATION Patient Name:  Wyatt Donovan  Date of Exam:   07/02/2020 Medical Rec #: [id number]         Accession #:    [id number] Date of Birth: May 14, 1985         Patient Gender: M Patient Age:   16Y Exam Location:  [facility] Procedure:      VAS US DOPPLER PRE CABG Referring Phys: [id number] HARRELL O LIGHTFOOT --------------------------------------------------------------------------------  Indications:   Pre-CABG. Risk Factors:  Hyperlipidemia, current smoker, prior MI, coronary artery                disease. Other Factors: Stent - 2012 & CHF. Performing Technologist: Rogelia Rohrer  Examination Guidelines: A complete evaluation includes B-mode imaging, spectral Doppler, color Doppler, and power Doppler as needed of all accessible portions of each vessel.  Bilateral testing is considered an integral part of a complete examination. Limited examinations for reoccurring indications may be performed as noted.  Right Carotid Findings: +----------+--------+--------+--------+--------+------------------+           PSV cm/sEDV cm/sStenosisDescribeComments           +----------+--------+--------+--------+--------+------------------+ CCA Prox  107     21                      intimal thickening +----------+--------+--------+--------+--------+------------------+ CCA Distal90      27                      intimal thickening +----------+--------+--------+--------+--------+------------------+ ICA Prox  53      26                                         +----------+--------+--------+--------+--------+------------------+ ICA Distal81      42                                         +----------+--------+--------+--------+--------+------------------+ ECA       111     14                                         +----------+--------+--------+--------+--------+------------------+ Portions of this table do not appear on this page. +----------+--------+-------+----------------+------------+           PSV cm/sEDV cmsDescribe        Arm Pressure +----------+--------+-------+----------------+------------+ Subclavian106            Multiphasic, WNL             +----------+--------+-------+----------------+------------+ +---------+--------+--+--------+--+---------+ VertebralPSV cm/s54EDV cm/s27Antegrade +---------+--------+--+--------+--+---------+ Left Carotid Findings: +----------+--------+--------+--------+--------+------------------+           PSV cm/sEDV cm/sStenosisDescribeComments           +----------+--------+--------+--------+--------+------------------+ CCA Prox  135     28                      intimal thickening +----------+--------+--------+--------+--------+------------------+ CCA Distal83      33                       intimal thickening +----------+--------+--------+--------+--------+------------------+ ICA Prox  55      27                                         +----------+--------+--------+--------+--------+------------------+ ICA Distal82      40                                         +----------+--------+--------+--------+--------+------------------+ ECA       81      14                                         +----------+--------+--------+--------+--------+------------------+ +----------+--------+--------+----------------+------------+ SubclavianPSV cm/sEDV cm/sDescribe        Arm Pressure +----------+--------+--------+----------------+------------+           113             Multiphasic, WNL             +----------+--------+--------+----------------+------------+ +---------+--------+--+--------+--+---------+ VertebralPSV cm/s56EDV cm/s27Antegrade +---------+--------+--+--------+--+---------+  ABI Findings: +--------+------------------+-----+---------+--------+ Right   Rt Pressure (mmHg)IndexWaveform Comment  +--------+------------------+-----+---------+--------+  [id number]                    triphasic         +--------+------------------+-----+---------+--------+ PTA     116               1.09 triphasic         +--------+------------------+-----+---------+--------+ PERO    122               1.15 triphasic         +--------+------------------+-----+---------+--------+ +--------+------------------+-----+---------+-------+ Left    Lt Pressure (mmHg)IndexWaveform Comment +--------+------------------+-----+---------+-------+ [id number]                    triphasic        +--------+------------------+-----+---------+-------+ PTA     130               1.23 triphasic        +--------+------------------+-----+---------+-------+ PERO    125               1.18 triphasic        +--------+------------------+-----+---------+-------+   Right Doppler Findings: +--------+--------+-----+---------+--------+ Site    PressureIndexDoppler  Comments +--------+--------+-----+---------+--------+ [id number]          triphasic         +--------+--------+-----+---------+--------+ Radial               triphasic         +--------+--------+-----+---------+--------+ Ulnar                triphasic         +--------+--------+-----+---------+--------+  Left Doppler Findings: +----------+--------+-----+---------+--------+ Site      PressureIndexDoppler  Comments +----------+--------+-----+---------+--------+ [id number]          triphasic         +----------+--------+-----+---------+--------+ Brachial  103          triphasic         +----------+--------+-----+---------+--------+ Radial                 triphasic         +----------+--------+-----+---------+--------+ Ulnar                  triphasic         +----------+--------+-----+---------+--------+  Summary: Right Carotid: The extracranial vessels were near-normal with only minimal wall                thickening or plaque. Left Carotid: The extracranial vessels were near-normal with only minimal wall               thickening or plaque. Vertebrals:  Bilateral vertebral arteries demonstrate antegrade flow. Subclavians: Normal flow hemodynamics were seen in bilateral subclavian              arteries. Right ABI: Resting right ankle-brachial index is within normal range. No evidence of significant right lower extremity arterial disease. Left ABI: Resting left ankle-brachial index is within normal range. No evidence of significant left lower extremity arterial disease. Right Upper Extremity: Normal PPG waveforms with radial artery compression suggest palmar arch patency. Doppler waveforms remain within normal limits with right radial compression. Doppler waveforms remain within normal limits with right ulnar compression. Left Upper Extremity: Abnormal PPG waveforms  with radial artery compression suggest an incomplete palmar arch. Doppler waveforms decrease 50% with left radial compression. Doppler waveforms remain within normal limits with left ulnar compression.  Electronically signed by Curt Jews MD on 07/02/2020 at 8:14:29 PM.    Final  Cardiac Studies    Please see echo report above.  EF 45%, probable organized inferoapical thrombus, and normal RV function.  MRI report from study performed 07/02/2020: IMPRESSION: 1. Acute MI in the in the basal to mid inferoseptal/inferior/inferolateral walls and apical lateral wall, with transmural LGE and areas of microvascular obstruction vs intramyocardial hemorrhage. Unable to assess this territory for viability in setting of acute MI  2. LAD territory appears viable. There is subendocardial LGE consistent with prior infarct in mid anteroseptal, apical septal/inferior walls and apex. LGE is greater than 50% transmural suggesting nonviability only at the very distal apex.  3.  LV apical thrombus measures 64mm x 53mm  4. Mild LV dilatation with severe systolic dysfunction (EF [id number]). Akinesis of basal to mid inferior/inferoseptal/inferolateral, mid anterolateral, and apical inferior/septal/lateral walls.  5.  Normal RV size and systolic function (EF [id number])   Cardiac cath 07/01/2020: Diagnostic Dominance: Co-dominant   LVEDP 13 at the time of coronary angiography.  Patient Profile     35 y.o. male with a very early onset CAD and remote anterior STEMI with LAD stent at age 89, presenting with unstable angina, evolved to posterior wall infarction and mild reduction in overall LVEF.  Echocardiogram shows a possible apical left ventricular thrombus, but this appears to be organized possibly calcified.  MRI demonstrates viable anterior wall indeterminate lateral wall and nonviable inferior wall.  Assessment & Plan    1. Acute inferoposterior myocardial infarction: Appropriate decision has been made to  offer surgery for this 35 yo with severe 3 vessel CAD, EF 30% viable anterior wall, indeterminate lateral wall(acute territory) and non-viable inferior wall. This will be a high risk recovery and may need support post op. 2. Tobacco abuse: Encourage smoking cessation 3. Dyslipidemia: Check LP(a) 4. Acute on chronic combined systolic and diastolic heart failure: May need support post procedure given significant LV systolic dysfunction.  Patient has no clinical evidence of heart failure.  LVEDP was normal at the time of cath post lateral wall infarct. 5. LV thrombus: Age is undetermined.  Dr. Kipp Brood is aware.  Multivessel coronary artery bypass grafting today by Dr. Kipp Brood.  We will follow..  For questions or updates, please contact [place] Please consult [email or web address] for contact info under        Signed, Sinclair Grooms, MD  07/03/2020, 7:43 AM

## 2020-07-03 NOTE — Progress Notes (Signed)
Report called to Daisey Must, CRNA @ [phone]. Patient leaving floor to OR for CABG; oncoming RN accompanied. Leveda Anna, BSN, RN

## 2020-07-03 NOTE — Discharge Summary (Signed)
Physician Discharge Summary       Wyatt Donovan [address]       Wyatt Donovan,Wyatt Donovan [id number]             [phone]    Patient ID: Wyatt Donovan MRN: [id number] DOB/AGE: 10/15/1985 35 y.o.  Admit date: 06/29/2020 Discharge date: 07/10/2020  Admission Diagnoses: 1. S/p non-ST elevation (NSTEMI) myocardial infarction ([place]) 2. Coronary artery disease  Discharge Diagnoses:  1. S/p CABG x 4 2. Expected post op blood loss anemia 3. History of the following: . Dyslipidemia   . Ex-smoker   . History of depression   . Insomnia   . Ischemic cardiomyopathy    Ejection fraction 40-45%  . NSTEMI (non-ST elevated myocardial infarction) ([place]) 08/2007   Treated with a bare metal stent to the proximal LAD  . Suicide attempt [facility])    Consults: Gastroenterology  Procedure (s):   07/03/2020 Patient:  Wyatt Donovan Pre-Op Dx: 3v CAD                         Ischemic cardiomyopathy                                LV thrombus       Post-op Dx:  same Procedure: CABG X 4.  LIMA LAD, Right radial artery to OM1, RSVG to PDA, and 1st Diagonal   Endoscopic greater saphenous vein harvest on the right   Surgeon and Role:      * Wyatt Donovan, Wyatt Crater, MD - Primary    * Wyatt Saunders, Wyatt Donovan - assisting  History of Presenting Illness: Wyatt Donovan is a 35 year old male with a past history significant for known early onset coronary artery disease having sustained an anterior myocardial infarction at age 38 with subsequent bare-metal stenting to the LAD.  He has dyslipidemia and a 21-pack-year history of smoking.  He was apparently lost to cardiology follow-up for many years but recently presented to [facility] with complaint of chest pain.  He reported having pain both with exertion and at rest and typically these episodes were relieved with nitroglycerin.  This pattern has been ongoing for about 1 year until the day of admission when he had an episode that was not relieved after  taking 3 nitroglycerin tablets taken within a half hour.  On initial presentation to his cardiologist at  [facility] on 06/27/2020, he had an EKG that showed possible ischemic changes but no evidence for acute MI.  Initial high-sensitivity troponin was 74.  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   He was transferred to [facility] for further evaluation and management on 06/29/2020.  By that time, he was continuing to have chest discomfort.  He was started on aspirin, heparin, and Coumadin for the suspected LV thrombus.  Later, IV nitroglycerin was added.  By the afternoon of 06/29/2020, his high-sensitivity troponin had risen to greater than 25,000 transthoracic echo was repeated on the day of admission that showed 2 areas of wall motion abnormality: one in the inferolateral segment felt to represent his acute infarction and another anterior apical segment with the attached thrombus which was felt to be the region of his old anterior infarction.  Overall ejection fraction was estimated at 40 to 45%.  There was  mild mitral insufficiency.  There is no evidence of aortic valve dysfunction. Wyatt Donovan was stabilized and remained anticoagulated.  He was taken to the Cath Lab on 07/01/2020 where left heart catheterization demonstrated severe three-vessel coronary artery disease.  Please see detailed report below.  CT surgery has been asked to evaluate Wyatt Donovan for consideration of coronary bypass grafting. The patient lives with his father in [place].  His mother passed away a few years ago.  He is unemployed and says he attempts to help his father with projects around the house but is otherwise inactive. He admits to having poor dentition with no dental care in a long time. He is left-hand dominant. Dr. Kipp Brood discussed the need for coronary artery bypass grafting surgery. Potential risks, benefits, and  complications of the surgery were discussed with the patient and he agreed to proceed with surgery. Pre operative carotid duplex US showed no significant internal carotid artery stenosis bilaterally. He underwent a CABG x 4 on 07/03/2020.  Brief Hospital Course:   The patient was extubated uneventfully.  During his stay in the SICU he was weaned off Dobutamine and Neo-synephrine as hemodynamics allowed.  He was initiated on Norvasc for his radial artery graft.  He was started on lasix and spironolactone for hypervolemic state.  His chest tubes and arterial lines were removed on 07/06/2020.  He was maintaining NSR and transferred to the progressive care unit on 07/08/2020 where he quickly progressed to independent mobility. He was weaned from the supplemental oxygen. He continued in NSR with mild sinus tachycardia with activity.  His carvedilol was advanced to 6.25mg  po BID. On post-op day 6 he was noted to have some bleeding into his stool. The GI service was asked to see Wyatt Donovan. The patient gave a history of this occurring prior to admission for the past few months. His Hct was trending up since the surgery and a significant GI bleed was not suspected.  Colonoscopy was offered but he declined and will plan to follow up with them as an outpatient.   Latest Vital Signs: Blood pressure 94/66, pulse 100, temperature 97.9 F (36.6 C), temperature source Oral, resp. rate 20, height 6\' 1"  (1.854 m), weight 80.4 kg, SpO2 96 %.  Physical Exam:  General appearance: alert, cooperative and no distress Neurologic: intact Heart: NSR / borderline sinus tach. No significant arrhythmias. Lungs: Breath sounds CTA.  Abdomen: soft and non-tender Extremities: no edema. The RUE and RLE incisions are intact and dry.  Wound: the sternotomy has some dried bloody drainage, otherwise intact. The sternum is stable.   Discharge Condition: Stable and discharged to home.  Recent laboratory studies:  Lab Results   Component Value Date   WBC 9.7 07/09/2020   HGB 9.7 (L) 07/09/2020   HCT 31.2 (L) 07/09/2020   MCV 98.1 07/09/2020   PLT 417 (H) 07/09/2020   Lab Results  Component Value Date   NA 137 07/09/2020   K 4.4 07/09/2020   CL 104 07/09/2020   CO2 25 07/09/2020   CREATININE 0.89 07/09/2020   GLUCOSE 92 07/09/2020     Diagnostic Studies: CARDIAC CATHETERIZATION  Result Date: 07/01/2020  Prox LAD to Mid LAD lesion is 100% stenosed.  Ost RCA to Prox RCA lesion is 100% stenosed.  Prox Cx to Mid Cx lesion is 95% stenosed.  SABRI FETTEROLF is a 35 y.o. male  [id number] LOCATION:  FACILITY: [facility] PHYSICIAN: Quay Burow, M.D. 05/03/1985 DATE OF PROCEDURE:  07/01/2020 DATE OF DISCHARGE: CARDIAC CATHETERIZATION  History obtained from chart review.35 y.o. male with a very early onset CAD and remote anterior STEMI with LAD stent at age 19, presenting with unstable angina, evolved to posterior wall infarction and mild reduction in overall LVEF.Echocardiogram shows a possible apical left ventricular thrombus, but this appears to be organized possibly calcified.   [name] has three-vessel disease.  He has an occluded LAD at the site of prior stenting with left to left and right to left collaterals as well as an occluded codominant RCA with right to LAD collaterals.  He has a 95% stenosis in the mid AV groove circumflex which is a codominant vessel just prior to the takeoff of a moderate-sized marginal branch.  LVEDP is not elevated.  He is relatively hypotensive in the Cath Lab and did receive a fluid bolus.  After review with his attending, Dr. Tamala Julian, it was decided to take a step back, to viability of the anterior wall, and consider CABG if his anterior wall is viable.  If not, he will need stenting of his mid AV groove circumflex.  Sheath was removed and a TR band was placed on the right wrist to achieve patent hemostasis.  The patient left lab in stable condition. Quay Burow. MD, [facility] 07/01/2020 12:55  PM   DG Chest Port 1 View  Result Date: 07/07/2020 CLINICAL DATA:  CHF EXAM: PORTABLE CHEST 1 VIEW COMPARISON:  Chest radiograph from one day prior. FINDINGS: Stable right internal jugular sheath. Intact sternotomy wires. Stable cardiomediastinal silhouette with mild cardiomegaly. No pneumothorax. Small bilateral pleural effusions, similar. Patchy parahilar interstitial and hazy opacities are slightly increased particularly in the right mid lung. Overall slightly improved lung volumes with persistent moderate bibasilar atelectasis. IMPRESSION: 1. Cardiomegaly. Slightly increased patchy parahilar interstitial and hazy opacities, particularly in the right mid lung, compatible with slightly worsened CHF. 2. Slightly improved lung volumes with persistent moderate bibasilar atelectasis. 3. Stable small bilateral pleural effusions. Electronically Signed   By: Ilona Sorrel M.D.   On: 07/07/2020 08:15   DG Chest Port 1 View  Result Date: 07/06/2020 CLINICAL DATA:  Pleural effusion, NSTEMI EXAM: PORTABLE CHEST 1 VIEW COMPARISON:  Chest radiograph from one day prior. FINDINGS: Stable right internal jugular central venous sheath terminating in the upper third of the SVC. Intact sternotomy wires. Stable mediastinal drains. CABG clips overlie the left mediastinum. Stable cardiomediastinal silhouette with mild cardiomegaly. No pneumothorax. Small bilateral pleural effusions are stable. No overt pulmonary edema. Bibasilar atelectasis, minimally improved. IMPRESSION: 1. No pneumothorax. 2. Stable small bilateral pleural effusions. 3. Bibasilar atelectasis, minimally improved. 4. Stable cardiomegaly without overt pulmonary edema. Electronically Signed   By: Ilona Sorrel M.D.   On: 07/06/2020 08:53   DG Chest Port 1 View  Result Date: 07/05/2020 CLINICAL DATA:  Swan-Ganz catheter removal EXAM: PORTABLE CHEST 1 VIEW COMPARISON:  Jul 04, 2020 FINDINGS: Swan-Ganz catheter has been removed. Cordis tip is in the superior vena  cava. Persistent mediastinal drains and left chest tube. No appreciable pneumothorax. There is cardiomegaly with pulmonary vascularity within normal limits. There are pleural effusions bilaterally with bibasilar atelectatic change. Status post coronary artery bypass grafting. No adenopathy. No bone lesions. IMPRESSION: Tube and catheter positions as described without pneumothorax. Persistent cardiomegaly with pleural effusions bilaterally and bibasilar atelectasis. Status post coronary artery bypass grafting. Electronically Signed   By: Lowella Grip III M.D.   On: 07/05/2020 08:37   DG Chest Port 1 View  Result Date: 07/04/2020 CLINICAL DATA:  Chest tube.  Open-heart surgery.  EXAM: PORTABLE CHEST 1 VIEW COMPARISON:  07/03/2020. FINDINGS: Interim extubation and NG tube removal. Swan-Ganz catheter in stable position with tip in the left pulmonary artery. Mediastinal drainage catheters and left chest tube in stable position. Prior CABG. Cardiomegaly with mild pulmonary venous congestion. Mild bibasilar interstitial prominence suggesting i mild nterstitial edema. Low lung volumes. Left base atelectasis/infiltrate. No prominent pleural effusion. No pneumothorax. IMPRESSION: 1. Interim extubation and removal of NG tube. Swan-Ganz catheter, mediastinal drainage catheter, left chest tube in stable position. No pneumothorax. 2. Prior CABG. Cardiomegaly. Mild bibasilar interstitial prominence suggesting mild interstitial edema. 3.  Low lung volumes with left base atelectasis/infiltrate. Electronically Signed   By: Marcello Moores  Register   On: 07/04/2020 06:58   DG Chest Port 1 View  Result Date: 07/03/2020 CLINICAL DATA:  Evaluate for pneumothorax EXAM: PORTABLE CHEST 1 VIEW COMPARISON:  06/29/2020 FINDINGS: Cardiac shadow is mildly prominent but accentuated by the portable technique. Postsurgical changes are now seen. Swan-Ganz catheter is noted in the left pulmonary artery. Endotracheal tube and gastric catheter are  seen in satisfactory position. Mediastinal drain is noted. Left thoracostomy tube is noted as well. No focal infiltrate or effusion is seen. No pneumothorax is noted. IMPRESSION: Postsurgical change with tubes and lines as described. No pneumothorax is identified. Electronically Signed   By: Inez Catalina M.D.   On: 07/03/2020 15:45   DG Chest Port 1 View  Result Date: 06/29/2020 CLINICAL DATA:  Initial evaluation for acute chest pain. EXAM: PORTABLE CHEST 1 VIEW COMPARISON:  Prior radiograph from 04/04/2019. FINDINGS: The cardiac and mediastinal silhouettes are stable in size and contour, and remain within normal limits. The lungs are normally inflated. No airspace consolidation, pleural effusion, or pulmonary edema. No pneumothorax. No acute osseous abnormality. IMPRESSION: No active cardiopulmonary disease. Electronically Signed   By: Jeannine Boga M.D.   On: 06/29/2020 06:19   MR CARDIAC MORPHOLOGY W WO CONTRAST  Result Date: 07/02/2020 CLINICAL DATA:  Evaluate for viability EXAM: CARDIAC MRI TECHNIQUE: The patient was scanned on a 1.5 Tesla Siemens magnet. A dedicated cardiac coil was used. Functional imaging was done using Fiesta sequences. 2,3, and 4 chamber views were done to assess for RWMA's. Modified Simpson's rule using a short axis stack was used to calculate an ejection fraction on a dedicated work Conservation officer, nature. The patient received 8 cc of Gadavist. After 10 minutes inversion recovery sequences were used to assess for infiltration and scar tissue. CONTRAST:  8 cc  of Gadavist FINDINGS: Left ventricle: -There was one mid ventricular short axis slice that was omitted from the short axis cine stack, which can affect calculation of volumes -Mild dilatation -Severe systolic dysfunction. Akinesis of basal to mid inferior/inferoseptal/inferolateral, mid anterolateral, and apical inferior/septal/lateral walls. -LV apical thrombus measuring 28mm x 54mm -Transmural LGE with areas of  microvascular obstruction vs intramyocardial hemorrhage consistent with acute MI in the basal to mid inferoseptal/inferior/inferolateral walls and apical lateral walls. -Subendocardial LGE consistent with prior infarct in mid anteroseptal, apical septal/inferior walls and apex. LGE>50% transmural only at distal apex LV EF: 27% (Normal 56-78%) Absolute volumes: LV EDV: 211mL (Normal 77-195 mL) LV ESV: 131mL (Normal 19-72 mL) LV SV: 66mL (Normal 51-133 mL) CO: 5.5L/min (Normal 2.8-8.8 L/min) Indexed volumes: LV EDV: 181mL/sq-m (Normal 47-92 mL/sq-m) LV ESV: 88mL/sq-m (Normal 13-30 mL/sq-m) LV SV: 31mL/sq-m (Normal 32-62 mL/sq-m) CI: 2.7L/min/sq-m (Normal 1.7-4.2 L/min/sq-m) Right ventricle: Normal size and systolic function RV EF:  57% (Normal 47-74%) Absolute volumes: RV EDV: 133mL (Normal 88-227 mL) RV  ESV: 71mL (Normal 23-103 mL) RV SV: 42mL (Normal 52-138 mL) CO: 6.1L/min (Normal 2.8-8.8 L/min) Indexed volumes: RV EDV: 50mL/sq-m (Normal 55-105 mL/sq-m) RV ESV: 28mL/sq-m (Normal 15-43 mL/sq-m) RV SV: 29mL/sq-m (Normal 32-64 mL/sq-m) CI: 3.0L/min/sq-m (Normal 1.7-4.2 L/min/sq-m) Left atrium: Normal size Right atrium: Normal size Mitral valve: Mild regurgitation Aortic valve: Tricuspid.  No regurgitation Tricuspid valve: No regurgitation Pulmonic valve: No regurgitation Aorta: Normal proximal ascending aorta Pericardium: Small effusion IMPRESSION: 1. Acute MI in the in the basal to mid inferoseptal/inferior/inferolateral walls and apical lateral wall, with transmural LGE and areas of microvascular obstruction vs intramyocardial hemorrhage. Unable to assess this territory for viability in setting of acute MI 2. LAD territory appears viable. There is subendocardial LGE consistent with prior infarct in mid anteroseptal, apical septal/inferior walls and apex. LGE is greater than 50% transmural suggesting nonviability only at the very distal apex. 3.  LV apical thrombus measures 52mm x 78mm 4. Mild LV dilatation with severe  systolic dysfunction (EF 61%). Akinesis of basal to mid inferior/inferoseptal/inferolateral, mid anterolateral, and apical inferior/septal/lateral walls. 5.  Normal RV size and systolic function (EF 60%) Electronically Signed   By: Oswaldo Milian MD   On: 07/02/2020 18:06   ECHOCARDIOGRAM COMPLETE  Result Date: 06/30/2020    ECHOCARDIOGRAM REPORT   Patient Name:   UVALDO RYBACKI Date of Exam: 06/29/2020 Medical Rec #:  [id number]        Height:       73.0 in Accession #:    [id number]       Weight:       172.6 lb Date of Birth:  March 07, 1985        BSA:          2.021 m Patient Age:    14 years         BP:           106/79 mmHg Patient Gender: M                HR:           73 bpm. Exam Location:  Inpatient Procedure: 2D Echo, Cardiac Doppler and Color Doppler Indications:    I25.5 Ischemic cardiomyopathy  History:        Patient has no prior history of Echocardiogram examinations.                 Previous Myocardial Infarction, CAD and Acute MI;                 Signs/Symptoms:Chest Pain. Known clot seen in the apex on echo                 two days prior at outside hospital.  Sonographer:    Corder Referring Phys: [id number] [place]  1. 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 Left ventricular ejection fraction, by estimation, is 40 to 45%. The left ventricle has mildly decreased function. The left ventricle demonstrates regional wall motion abnormalities (see scoring diagram/findings for description). Left ventricular diastolic parameters are consistent with Grade II diastolic dysfunction (pseudonormalization). Elevated left atrial pressure. There is severe hypokinesis of the entire left ventricular inferolateral wall. There is moderate hypokinesis of the left ventricular inferior wall. There is akinesis of a small segment of the apical  anterior wall.  2. Right ventricular systolic function is normal. The right ventricular size is  normal.  3. Left atrial size was mildly dilated.  4. The mitral valve is normal in structure. Mild mitral valve regurgitation.  5. The aortic valve is normal in structure. Aortic valve regurgitation is not visualized. Comparison(s): No prior Echocardiogram. 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 FINDINGS  Left Ventricle: There is a small (6x7 mm) hyperechogenic thrombus (possibly calcified) at the left ventricular apex, in the area of the akinetic anteroapical segment. It has the appearance of an old organized thrombus. Left ventricular ejection fraction, by estimation, is 40 to 45%. The left ventricle has mildly decreased function. The left ventricle demonstrates regional wall motion abnormalities. Severe hypokinesis of the left ventricular, entire inferolateral wall. Moderate hypokinesis of the left ventricular, entire inferior wall. Severe akinesis of the left ventricular, apical anterior wall. The left ventricular internal cavity size was normal in size. There is no left ventricular hypertrophy. Left ventricular diastolic parameters are consistent with Grade II diastolic dysfunction (pseudonormalization). Elevated left atrial pressure. Right Ventricle: The right ventricular size is normal. No increase in right ventricular wall thickness. Right ventricular systolic function is normal. Left Atrium: Left atrial size was mildly dilated. Right Atrium: Right atrial size was normal in size. Pericardium: There is no evidence of pericardial effusion. Mitral Valve: The mitral valve is normal in structure. Mild mitral valve regurgitation, with centrally-directed jet. Tricuspid Valve: The tricuspid valve is normal in structure. Tricuspid  valve regurgitation is not demonstrated. Aortic Valve: The aortic valve is normal in structure. Aortic valve regurgitation is not visualized. Aortic valve mean gradient measures 2.0 mmHg. Aortic valve peak gradient measures 2.9 mmHg. Aortic valve area, by VTI measures 2.75 cm. Pulmonic Valve: The pulmonic valve was normal in structure. Pulmonic valve regurgitation is not visualized. Aorta: The aortic root and ascending aorta are structurally normal, with no evidence of dilitation. IAS/Shunts: No atrial level shunt detected by color flow Doppler.  LEFT VENTRICLE PLAX 2D LVIDd:         5.40 cm      Diastology LVIDs:         4.20 cm      LV e' medial:    8.81 cm/s LV PW:         0.90 cm      LV E/e' medial:  7.5 LV IVS:        0.80 cm      LV e' lateral:   8.70 cm/s LVOT diam:     2.30 cm      LV E/e' lateral: 7.6 LV SV:         40 LV SV Index:   20 LVOT Area:     4.15 cm  LV Volumes (MOD) LV vol d, MOD A2C: 115.0 ml LV vol d, MOD A4C: 157.0 ml LV vol s, MOD A2C: 74.1 ml LV vol s, MOD A4C: 76.8 ml LV SV MOD A2C:     40.9 ml LV SV MOD A4C:     157.0 ml LV SV MOD BP:      58.6 ml RIGHT VENTRICLE RV Basal diam:  3.40 cm LEFT ATRIUM             Index       RIGHT ATRIUM           Index LA diam:        3.70 cm 1.83 cm/m  RA Area:     20.20 cm LA Vol (A2C):   67.9 ml 33.60 ml/m RA Volume:   54.00 ml  26.72 ml/m LA Vol (A4C):   49.1 ml 24.29 ml/m LA Biplane Vol: 59.3 ml 29.34 ml/m  AORTIC VALVE AV Area (Vmax):    2.85 cm AV Area (Vmean):   2.73 cm AV Area (VTI):     2.75 cm AV Vmax:           85.70 cm/s AV Vmean:          56.700 cm/s AV VTI:            0.147 m AV Peak Grad:      2.9 mmHg AV Mean Grad:      2.0 mmHg LVOT Vmax:         58.70 cm/s LVOT Vmean:        37.300 cm/s LVOT VTI:          0.097 m LVOT/AV VTI ratio: 0.66  AORTA Ao Root diam: 3.40 cm MITRAL VALVE MV Area (PHT): 5.02 cm    SHUNTS MV Decel Time: 151 msec    Systemic VTI:  0.10 m MV E velocity: 66.00 cm/s  Systemic Diam: 2.30 cm MV A velocity:  51.40 cm/s MV E/A ratio:  1.28 Mihai Croitoru MD Electronically signed by Sanda Klein MD Signature Date/Time: 06/30/2020/9:59:07 AM    Final    VAS US DOPPLER PRE CABG  Result Date: 07/02/2020 PREOPERATIVE VASCULAR EVALUATION Patient Name:  FRAK PAOLINI  Date of Exam:   07/02/2020 Medical Rec #: [id number]         Accession #:    [id number] Date of Birth: [birth year]         Patient Gender: M Patient Age:   6Y Exam Location:  [facility] Procedure:      VAS US DOPPLER PRE CABG Referring Phys: [id number] HARRELL O Wyatt Donovan --------------------------------------------------------------------------------  Indications:   Pre-CABG. Risk Factors:  Hyperlipidemia, current smoker, prior MI, coronary artery                disease. Other Factors: Stent - 2012 & CHF. Performing Technologist: Rogelia Rohrer  Examination Guidelines: A complete evaluation includes B-mode imaging, spectral Doppler, color Doppler, and power Doppler as needed of all accessible portions of each vessel. Bilateral testing is considered an integral part of a complete examination. Limited examinations for reoccurring indications may be performed as noted.  Right Carotid Findings: +----------+--------+--------+--------+--------+------------------+           PSV cm/sEDV cm/sStenosisDescribeComments           +----------+--------+--------+--------+--------+------------------+ CCA Prox  107     21                      intimal thickening +----------+--------+--------+--------+--------+------------------+ CCA Distal90      27                      intimal thickening +----------+--------+--------+--------+--------+------------------+ ICA Prox  53      26                                         +----------+--------+--------+--------+--------+------------------+ ICA Distal81      42                                         +----------+--------+--------+--------+--------+------------------+  ECA       111     14                                          +----------+--------+--------+--------+--------+------------------+ Portions of this table do not appear on this page. +----------+--------+-------+----------------+------------+           PSV cm/sEDV cmsDescribe        Arm Pressure +----------+--------+-------+----------------+------------+ [id number]            Multiphasic, WNL             +----------+--------+-------+----------------+------------+ +---------+--------+--+--------+--+---------+ VertebralPSV cm/s54EDV cm/s27Antegrade +---------+--------+--+--------+--+---------+ Left Carotid Findings: +----------+--------+--------+--------+--------+------------------+           PSV cm/sEDV cm/sStenosisDescribeComments           +----------+--------+--------+--------+--------+------------------+ CCA Prox  135     28                      intimal thickening +----------+--------+--------+--------+--------+------------------+ CCA Distal83      33                      intimal thickening +----------+--------+--------+--------+--------+------------------+ ICA Prox  55      27                                         +----------+--------+--------+--------+--------+------------------+ ICA Distal82      40                                         +----------+--------+--------+--------+--------+------------------+ ECA       81      14                                         +----------+--------+--------+--------+--------+------------------+ +----------+--------+--------+----------------+------------+ SubclavianPSV cm/sEDV cm/sDescribe        Arm Pressure +----------+--------+--------+----------------+------------+           113             Multiphasic, WNL             +----------+--------+--------+----------------+------------+ +---------+--------+--+--------+--+---------+ VertebralPSV cm/s56EDV cm/s27Antegrade +---------+--------+--+--------+--+---------+  ABI  Findings: +--------+------------------+-----+---------+--------+ Right   Rt Pressure (mmHg)IndexWaveform Comment  +--------+------------------+-----+---------+--------+ [id number]                    triphasic         +--------+------------------+-----+---------+--------+ PTA     116               1.09 triphasic         +--------+------------------+-----+---------+--------+ PERO    122               1.15 triphasic         +--------+------------------+-----+---------+--------+ +--------+------------------+-----+---------+-------+ Left    Lt Pressure (mmHg)IndexWaveform Comment +--------+------------------+-----+---------+-------+ [id number]                    triphasic        +--------+------------------+-----+---------+-------+ PTA     130               1.23 triphasic        +--------+------------------+-----+---------+-------+  PERO    125               1.18 triphasic        +--------+------------------+-----+---------+-------+  Right Doppler Findings: +--------+--------+-----+---------+--------+ Site    PressureIndexDoppler  Comments +--------+--------+-----+---------+--------+ [id number]          triphasic         +--------+--------+-----+---------+--------+ Radial               triphasic         +--------+--------+-----+---------+--------+ Ulnar                triphasic         +--------+--------+-----+---------+--------+  Left Doppler Findings: +----------+--------+-----+---------+--------+ Site      PressureIndexDoppler  Comments +----------+--------+-----+---------+--------+ [id number]          triphasic         +----------+--------+-----+---------+--------+ Brachial  103          triphasic         +----------+--------+-----+---------+--------+ Radial                 triphasic         +----------+--------+-----+---------+--------+ Ulnar                  triphasic          +----------+--------+-----+---------+--------+  Summary: Right Carotid: The extracranial vessels were near-normal with only minimal wall                thickening or plaque. Left Carotid: The extracranial vessels were near-normal with only minimal wall               thickening or plaque. Vertebrals:  Bilateral vertebral arteries demonstrate antegrade flow. Subclavians: Normal flow hemodynamics were seen in bilateral subclavian              arteries. Right ABI: Resting right ankle-brachial index is within normal range. No evidence of significant right lower extremity arterial disease. Left ABI: Resting left ankle-brachial index is within normal range. No evidence of significant left lower extremity arterial disease. Right Upper Extremity: Normal PPG waveforms with radial artery compression suggest palmar arch patency. Doppler waveforms remain within normal limits with right radial compression. Doppler waveforms remain within normal limits with right ulnar compression. Left Upper Extremity: Abnormal PPG waveforms with radial artery compression suggest an incomplete palmar arch. Doppler waveforms decrease 50% with left radial compression. Doppler waveforms remain within normal limits with left ulnar compression.  Electronically signed by Gretta Began MD on 07/02/2020 at 8:14:29 PM.    Final     Discharge Medications: Allergies as of 07/10/2020      Reactions   Codeine Itching      Medication List    STOP taking these medications   COUMADIN PO   metoprolol succinate 25 MG 24 hr tablet Commonly known as: TOPROL-XL     TAKE these medications   aspirin 325 MG EC tablet Take 1 tablet (325 mg total) by mouth daily. Start taking on: Jul 11, 2020   atorvastatin 40 MG tablet Commonly known as: LIPITOR Take 40 mg by mouth daily.   carvedilol 6.25 MG tablet Commonly known as: COREG Take 1 tablet (6.25 mg total) by mouth 2 (two) times daily with a meal.   dapagliflozin propanediol 10 MG Tabs  tablet Commonly known as: Farxiga Take 1 tablet (10 mg total) by mouth daily before breakfast.   nitroGLYCERIN 0.4 MG SL tablet Commonly known as: NITROSTAT Place 0.4 mg under the tongue every  5 (five) minutes as needed for chest pain.   oxyCODONE-acetaminophen 5-325 MG tablet Commonly known as: PERCOCET/ROXICET Take 1 tablet by mouth every 4 (four) hours as needed for moderate pain.   spironolactone 25 MG tablet Commonly known as: ALDACTONE Take 1 tablet (25 mg total) by mouth daily. Start taking on: Jul 11, 2020      The patient has been discharged on:   1.Beta Blocker:  Yes [ X  ]                              No   [   ]                              If No, reason:  2.Ace Inhibitor/ARB: Yes [   ]                                     No  [ x   ]                                     If No, reason: BP soft, can be added as outpatient.  3.Statin:   Yes [ X  ]                  No  [   ]                  If No, reason:  4.Ecasa:  Yes  [  X ]                  No   [   ]                  If No, reason:  Follow Up Appointments:  Follow-up Information    Ledora Bottcher, PA. Go on 07/24/2020.   Specialties: Physician Assistant, Cardiology, Radiology Why: Appointment time is at 11:15 am  Contact information: [address] [place] [address] [phone]        Lajuana Matte, MD On 07/12/2020.   Specialty: Cardiothoracic Surgery Why: Appointment is VIRTUAL;do NOT go to the office. Appointment time is at 2:10 pm Contact information: [address] [place] [address] [place], [place] Follow up.   Why: call to make appointment to establish with a primary care provider Contact information: [place] [address] [phone]               Signed: Antony Odea, Wyatt Donovan 07/10/2020, 9:03 AM

## 2020-07-03 NOTE — Anesthesia Procedure Notes (Signed)
Procedure Name: Intubation Performed by: Valda Favia, CRNA Pre-anesthesia Checklist: Patient identified, Emergency Drugs available, Suction available and Patient being monitored Patient Re-evaluated:Patient Re-evaluated prior to induction Oxygen Delivery Method: Circle System Utilized Preoxygenation: Pre-oxygenation with 100% oxygen Induction Type: IV induction Ventilation: Mask ventilation without difficulty Laryngoscope Size: Mac and 4 Grade View: Grade I Tube type: Oral Tube size: 8.0 mm Number of attempts: 1 Airway Equipment and Method: Stylet and Oral airway Placement Confirmation: ETT inserted through vocal cords under direct vision,  positive ETCO2 and breath sounds checked- equal and bilateral Secured at: 22 cm Tube secured with: Tape Dental Injury: Teeth and Oropharynx as per pre-operative assessment

## 2020-07-03 NOTE — Discharge Instructions (Signed)

## 2020-07-03 NOTE — Progress Notes (Signed)
ANTICOAGULATION CONSULT NOTE - Follow Up Consult  Pharmacy Consult for heparin Indication: 3vCAD and LV thrombus  Labs: Recent Labs    06/30/20 0544 07/01/20 0044 07/01/20 0909 07/02/20 0838 07/02/20 1827 07/03/20 0214  HGB  --  14.0  --  14.4  --   --   HCT  --  42.8  --  44.2  --   --   PLT  --  151  --  151  --   --   LABPROT  --  18.8*  --  16.8*  --   --   INR  --  1.6*  --  1.4*  --   --   HEPARINUNFRC  --  0.26*   < > 0.18* 0.29* 0.35  CREATININE  --  0.99  --  0.89  --   --   TROPONINIHS >27,000*  --   --   --   --   --    < > = values in this interval not displayed.    Assessment/Plan:  35yo male therapeutic on heparin after rate changes. Will continue gtt at current rate of 1850 units/hr and f/u after CABG.   Wynona Neat, PharmD, BCPS  07/03/2020,3:36 AM

## 2020-07-03 NOTE — Progress Notes (Signed)
      [address]       [place] [address]             [phone]    S/p CABG x 4   Intubated, sedated  BP 102/80   Pulse (!) 103   Temp 98.24 F (36.8 C)   Resp 20   Ht 6\' 1"  (1.854 m)   Wt 77.7 kg   SpO2 100%   BMI 22.60 kg/m  CO= 3.2, CI 1.6 Dobutamine 2.5 (down from 5)   Intake/Output Summary (Last 24 hours) at 07/03/2020 1837 Last data filed at 07/03/2020 1800 Gross per 24 hour  Intake 5023.34 ml  Output 2405 ml  Net 2618.34 ml   Hct= 34, PLT 118K Minimal CT output  Will give volume now,. If index remains low may increase dobutamine again  Remo Lipps C. Roxan Hockey, MD [facility] [phone]

## 2020-07-03 NOTE — Progress Notes (Signed)
     [address]       ,[place] [address]             [phone]       No events Complains of a bloody stool  Vitals:   07/03/20 0400 07/03/20 0527  BP: 93/61 106/75  Pulse: 88 (!) 102  Resp: 10   Temp: 98.9 F (37.2 C)   SpO2: 93%    Alert NAD Sinus EWOB  OR today for CABG 4 with right radial artery harvest  Wyatt Donovan

## 2020-07-03 NOTE — Transfer of Care (Signed)
Immediate Anesthesia Transfer of Care Note  Patient: Wyatt Donovan  Procedure(s) Performed: CORONARY ARTERY BYPASS GRAFTING (CABG) times four using left internal mammary artery, right arm radial artery and right leg saphenous vein (N/A Chest) RADIAL ARTERY HARVEST (Right Arm Lower) TRANSESOPHAGEAL ECHOCARDIOGRAM (TEE) (N/A Esophagus)  Patient Location: ICU  Anesthesia Type:General  Level of Consciousness: sedated and Patient remains intubated per anesthesia plan  Airway & Oxygen Therapy: Patient remains intubated per anesthesia plan and Patient placed on Ventilator (see vital sign flow sheet for setting)  Post-op Assessment: Report given to RN and Post -op Vital signs reviewed and stable  Post vital signs: Reviewed and stable  Last Vitals:  Vitals Value Taken Time  BP    Temp    Pulse 126 07/03/20 1513  Resp 17 07/03/20 1513  SpO2 99 % 07/03/20 1513  Vitals shown include unvalidated device data.  Last Pain:  Vitals:   07/03/20 0400  TempSrc: Oral  PainSc:       Patients Stated Pain Goal: 2 (59/93/57 0177)  Complications: No complications documented.

## 2020-07-03 NOTE — Anesthesia Procedure Notes (Addendum)
Central Venous Catheter Insertion Performed by: Belinda Block, MD, anesthesiologist Start/End5/05/2020 8:00 AM, 07/03/2020 8:20 AM Patient location: Pre-op. Preanesthetic checklist: patient identified, IV checked, site marked, risks and benefits discussed, surgical consent, monitors and equipment checked, pre-op evaluation, timeout performed and anesthesia consent Lidocaine 1% used for infiltration and patient sedated Hand hygiene performed  and maximum sterile barriers used  Catheter size: 8.5 Fr Sheath introducer Procedure performed using ultrasound guided technique. Ultrasound Notes:anatomy identified, needle tip was noted to be adjacent to the nerve/plexus identified, no ultrasound evidence of intravascular and/or intraneural injection and image(s) printed for medical record Attempts: 1 Following insertion, line sutured and dressing applied. Post procedure assessment: blood return through all ports, free fluid flow and no air  Patient tolerated the procedure well with no immediate complications.

## 2020-07-03 NOTE — Anesthesia Procedure Notes (Addendum)
Central Venous Catheter Insertion Performed by: Belinda Block, MD, anesthesiologist Start/End5/05/2020 8:00 AM, 07/03/2020 8:20 AM Patient location: Pre-op. Preanesthetic checklist: patient identified, IV checked, site marked, risks and benefits discussed, surgical consent, monitors and equipment checked, pre-op evaluation, timeout performed and anesthesia consent Position: Trendelenburg Patient sedated Hand hygiene performed  and maximum sterile barriers used  Catheter size: 8.5 Fr PA cath was placed.Sheath introducer Swan type:thermodilution Procedure performed without using ultrasound guided technique. Ultrasound Notes:anatomy identified and image(s) printed for medical record Attempts: 1 Following insertion, line sutured, dressing applied and Biopatch. Post procedure assessment: blood return through all ports  Patient tolerated the procedure well with no immediate complications.

## 2020-07-03 NOTE — Anesthesia Postprocedure Evaluation (Signed)
Anesthesia Post Note  Patient: Usher L Nickless  Procedure(s) Performed: CORONARY ARTERY BYPASS GRAFTING (CABG) times four using left internal mammary artery, right arm radial artery and right leg saphenous vein (N/A Chest) RADIAL ARTERY HARVEST (Right Arm Lower) TRANSESOPHAGEAL ECHOCARDIOGRAM (TEE) (N/A Esophagus)     Patient location during evaluation: ICU Anesthesia Type: General Level of consciousness: patient remains intubated per anesthesia plan Pain management: pain level controlled Vital Signs Assessment: post-procedure vital signs reviewed and stable Respiratory status: patient remains intubated per anesthesia plan Cardiovascular status: stable Postop Assessment: no apparent nausea or vomiting Anesthetic complications: no   No complications documented.  Last Vitals:  Vitals:   07/03/20 1545 07/03/20 1600  BP:  105/82  Pulse: (!) 121 (!) 119  Resp: 19 20  Temp: 36.9 C 36.8 C  SpO2: 100% 100%    Last Pain:  Vitals:   07/03/20 0400  TempSrc: Oral  PainSc:                  Abriel Geesey

## 2020-07-03 NOTE — Op Note (Signed)
[address]       [place] [address]             [phone]                                          07/03/2020 Patient:  Rockey Situ Ryder Pre-Op Dx: 3v CAD   Ischemic cardiomyopathy    LV thrombus    Post-op Dx:  same Procedure: CABG X 4.  LIMA LAD, Right radial artery to OM1, RSVG to PDA, and 1st Diagonal   Endoscopic greater saphenous vein harvest on the right   Surgeon and Role:      * Aizik Reh, Lucile Crater, MD - Primary    Josie Saunders, PA-C - assisting  Anesthesia  general EBL:  472ml Blood Administration: none Xclamp Time:  56 min Pump Time:  169min  Drains: 74 F blake drain: L, mediastinal  Wires: none Counts: correct   Indications: 35 yo male with severe 3V CAD, and CHF.  On review of his LHC, he has targets suitable for bypass.  The cardiac MRI does show viable myocardium in much of the LAD territory except at the apex.  There is also an LV thrombus, but it is less than 1cm, and thus will likely resolve with anticoagulation.  The risks and benefits have been discussed, and he is agreeable to proceed with CABG 4 with right radial artery harvest.  Due to his heart failure, a continuous cardiac output swan will be placed pre-operatively Findings: Fibrotic lateral and inferior walls.  Small PDA target.  Moderate sized OM.  Calcified LAD.  Small diagonal.  Good LAD, radial and vein.  The radial artery was jumped off the hood of the diagonal vein graft.  Bleeding was noted at the PDA anastomosis after coming off pump, but this was controlled with 1 stitch.  Operative Technique: All invasive lines were placed in pre-op holding.  After the risks, benefits and alternatives were thoroughly discussed, the patient was brought to the operative theatre.  Anesthesia was induced, and the patient was prepped and draped in normal sterile fashion.  An appropriate surgical pause was performed, and pre-operative antibiotics were dosed accordingly.  We began  with an incision along the right forearm.  The radial artery was identified, and harvested.  We then made simultaneous incisions along the right leg for harvesting of the greater saphenous vein and the chest for the sternotomy.  In regards to the sternotomy, this was carried down with bovie cautery, and the sternum was divided with a reciprocating saw.  Meticulous hemostasis was obtained.  The left internal thoracic artery was exposed and harvested in in pedicled fashion.  The patient was systemically heparinized, and the artery was divided distally, and placed in a papaverine sponge.    The sternal elevator was removed, and a retractor was placed.  The pericardium was divided in the midline and fashioned into a cradle with pericardial stitches.   After we confirmed an appropriate ACT, the ascending aorta was cannulated in standard fashion.  The right atrial appendage was used for venous cannulation site.  Cardiopulmonary bypass was initiated, and the heart retractor was placed. The cross clamp was applied, and a dose of anterograde cardioplegia was given with good arrest of the heart.  We moved to the posterior wall of the heart, and found a good target  on the PDA.  An arteriotomy was made, and the vein graft was anastomosed to it in an end to side fashion.  Next we exposed the lateral wall, and found a good target on the OM.  An end to side anastomosis with the radial artery graft was then created.  Next, we exposed the anterior wall of the heart and identified a good target on first diagonal.   An arteriotomy was created.  The vein was anastomosed in an end to side fashion.  Finally, we exposed a good target on the LAD, and fashioned an end to side anastomosis between it and the LITA.  We began to re-warm, and a re-animation dose of cardioplegia was given.  The heart was de-aired, and the cross clamp was removed.  Meticulous hemostasis was obtained.    A partial occludding clamp was then placed on the  ascending aorta, and we created an end to side anastomosis between it and the proximal vein grafts.  The proximal sites were marked with rings.  The radial artery was jumped off the hood of the diagonal vein graft.  Hemostasis was obtained, and we separated from cardiopulmonary bypass without event.the heparin was reversed with protamine.  Chest tubes and wires were placed, and the sternum was re-approximated with with sternal wires.  The soft tissue and skin were re-approximated wth absorbable suture.    The patient tolerated the procedure without any immediate complications, and was transferred to the ICU in guarded condition.  Annelisa Ryback Bary Leriche

## 2020-07-04 ENCOUNTER — Inpatient Hospital Stay (HOSPITAL_COMMUNITY): Payer: Medicaid Other

## 2020-07-04 ENCOUNTER — Encounter (HOSPITAL_COMMUNITY): Payer: Self-pay | Admitting: Thoracic Surgery (Cardiothoracic Vascular Surgery)

## 2020-07-04 DIAGNOSIS — Z951 Presence of aortocoronary bypass graft: Secondary | ICD-10-CM

## 2020-07-04 LAB — BASIC METABOLIC PANEL
Anion gap: 7 (ref 5–15)
Anion gap: 7 (ref 5–15)
BUN: <5 mg/dL — ABNORMAL LOW (ref 6–20)
BUN: 5 mg/dL — ABNORMAL LOW (ref 6–20)
CO2: 22 mmol/L (ref 22–32)
CO2: 23 mmol/L (ref 22–32)
Calcium: 7.7 mg/dL — ABNORMAL LOW (ref 8.9–10.3)
Calcium: 7.6 mg/dL — ABNORMAL LOW (ref 8.9–10.3)
Chloride: 107 mmol/L (ref 98–111)
Chloride: 106 mmol/L (ref 98–111)
Creatinine, Ser: 0.73 mg/dL (ref 0.61–1.24)
Creatinine, Ser: 0.75 mg/dL (ref 0.61–1.24)
GFR, Estimated: >60 mL/min (ref >60)
GFR, Estimated: >60 mL/min (ref >60)
Glucose, Bld: 119 mg/dL — ABNORMAL HIGH (ref 70–99)
Glucose, Bld: 125 mg/dL — ABNORMAL HIGH (ref 70–99)
Potassium: 4.2 mmol/L (ref 3.5–5.1)
Potassium: 4.4 mmol/L (ref 3.5–5.1)
Sodium: 136 mmol/L (ref 135–145)
Sodium: 136 mmol/L (ref 135–145)

## 2020-07-04 LAB — GLUCOSE, CAPILLARY
Glucose-Capillary: 110 mg/dL — ABNORMAL HIGH (ref 70–99)
Glucose-Capillary: 121 mg/dL — ABNORMAL HIGH (ref 70–99)
Glucose-Capillary: 133 mg/dL — ABNORMAL HIGH (ref 70–99)
Glucose-Capillary: 126 mg/dL — ABNORMAL HIGH (ref 70–99)
Glucose-Capillary: 124 mg/dL — ABNORMAL HIGH (ref 70–99)
Glucose-Capillary: 109 mg/dL — ABNORMAL HIGH (ref 70–99)

## 2020-07-04 LAB — CBC
HCT: 30 % — ABNORMAL LOW (ref 39.0–52.0)
HCT: 29.5 % — ABNORMAL LOW (ref 39.0–52.0)
Hemoglobin: 9.8 g/dL — ABNORMAL LOW (ref 13.0–17.0)
Hemoglobin: 9.4 g/dL — ABNORMAL LOW (ref 13.0–17.0)
MCH: 31.4 pg (ref 26.0–34.0)
MCH: 31.1 pg (ref 26.0–34.0)
MCHC: 32.7 g/dL (ref 30.0–36.0)
MCHC: 31.9 g/dL (ref 30.0–36.0)
MCV: 96.2 fL (ref 80.0–100.0)
MCV: 97.7 fL (ref 80.0–100.0)
Platelets: 123 10*3/uL — ABNORMAL LOW (ref 150–400)
Platelets: 133 10*3/uL — ABNORMAL LOW (ref 150–400)
RBC: 3.12 MIL/uL — ABNORMAL LOW (ref 4.22–5.81)
RBC: 3.02 MIL/uL — ABNORMAL LOW (ref 4.22–5.81)
RDW: 14.1 % (ref 11.5–15.5)
RDW: 14.6 % (ref 11.5–15.5)
WBC: 7.8 10*3/uL (ref 4.0–10.5)
WBC: 9.7 10*3/uL (ref 4.0–10.5)
nRBC: 0 % (ref 0.0–0.2)
nRBC: 0 % (ref 0.0–0.2)

## 2020-07-04 LAB — MAGNESIUM
Magnesium: 2.7 mg/dL — ABNORMAL HIGH (ref 1.7–2.4)
Magnesium: 2.7 mg/dL — ABNORMAL HIGH (ref 1.7–2.4)

## 2020-07-04 IMAGING — DX DG CHEST 1V PORT
1 series · 1 of 1 positions shown · non-contrast
Comparison: [DATE].

CLINICAL DATA: Chest tube.  Open-heart surgery.

EXAM:
PORTABLE CHEST 1 VIEW

[chest ap]
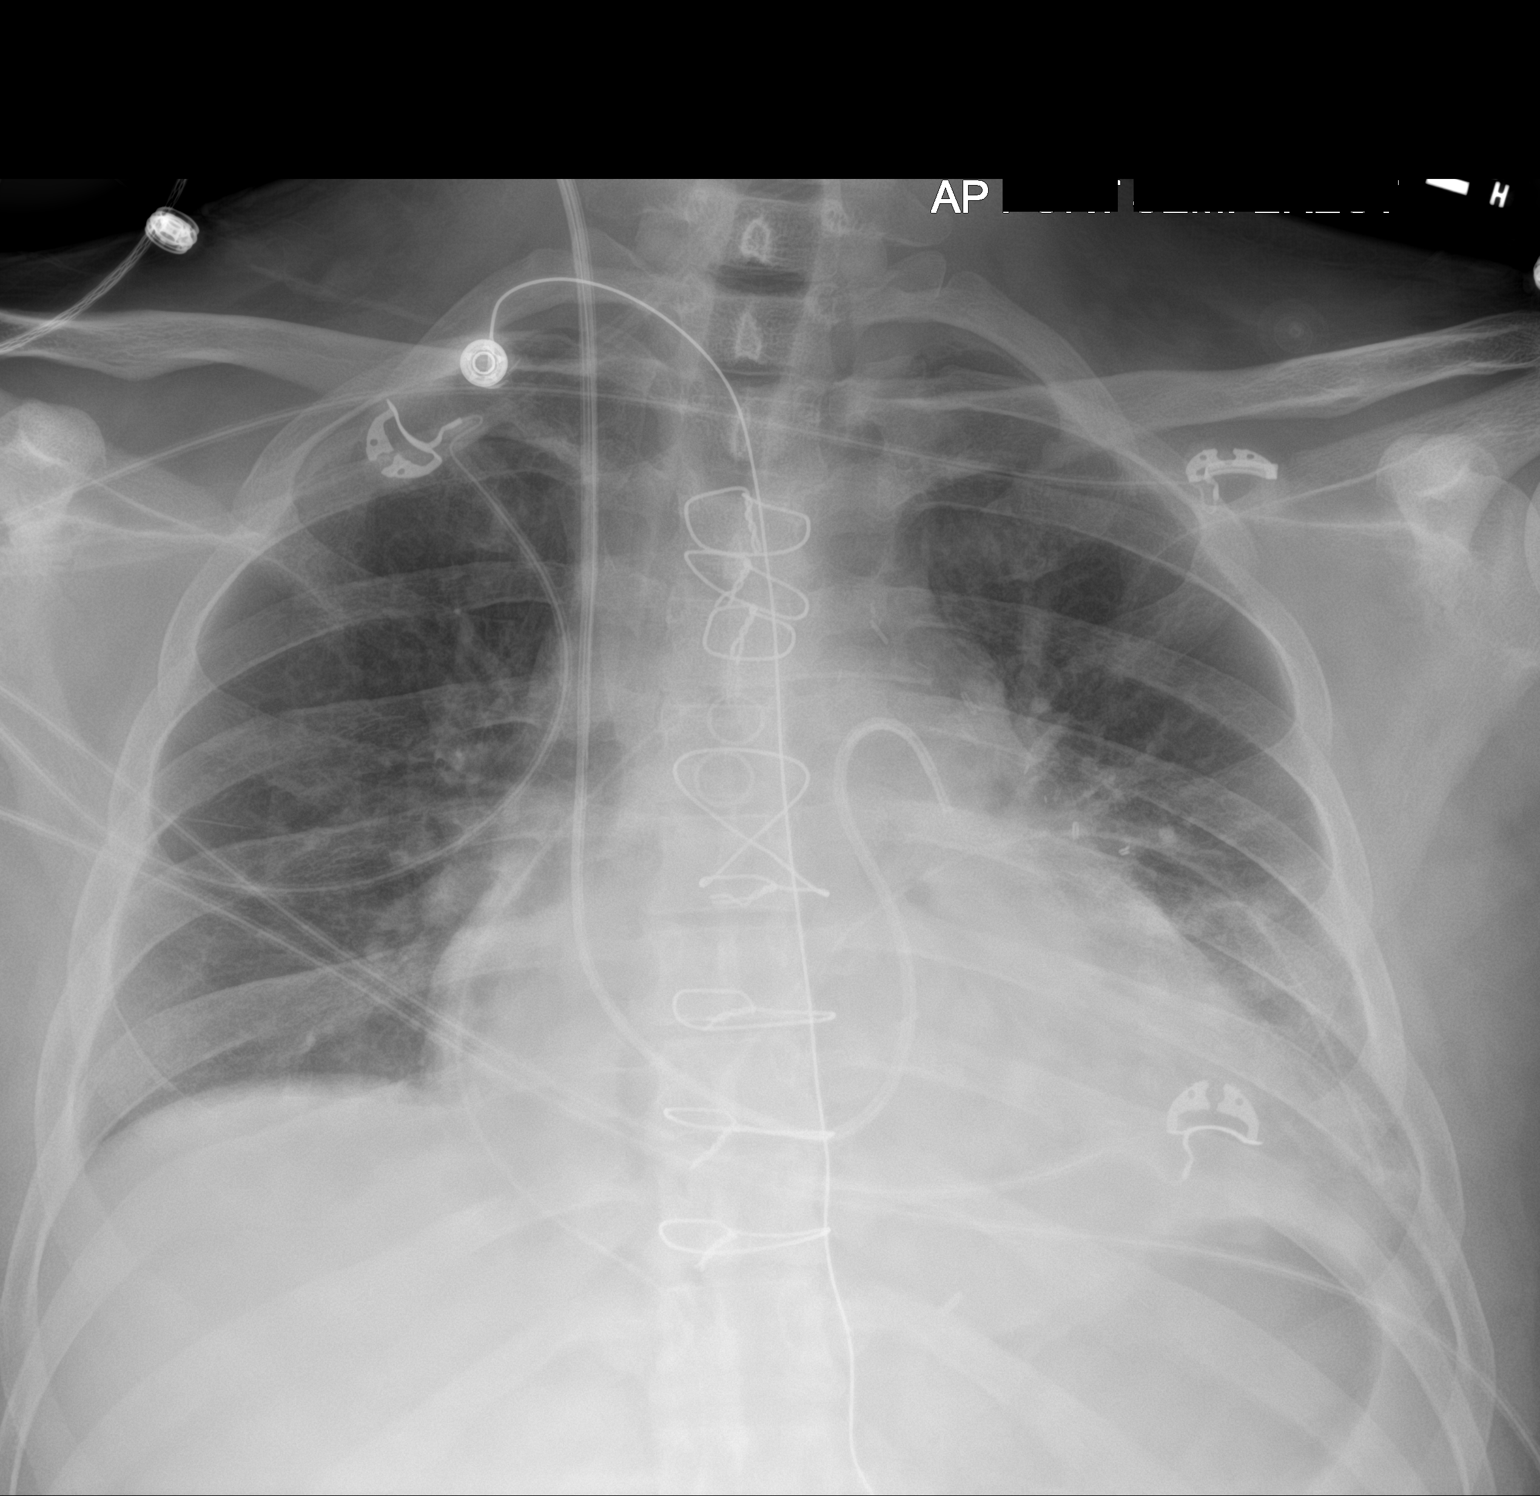

[1 of 1 positions shown; findings below may reference images not displayed]

FINDINGS: Interim extubation and NG tube removal. Swan-Ganz catheter in stable
position with tip in the left pulmonary artery. Mediastinal drainage
catheters and left chest tube in stable position. Prior CABG.
Cardiomegaly with mild pulmonary venous congestion. Mild bibasilar
interstitial prominence suggesting i mild nterstitial edema. Low
lung volumes. Left base atelectasis/infiltrate. No prominent pleural
effusion. No pneumothorax.
IMPRESSION: 1. Interim extubation and removal of NG tube. Swan-Ganz catheter,
mediastinal drainage catheter, left chest tube in stable position.
No pneumothorax.

2. Prior CABG. Cardiomegaly. Mild bibasilar interstitial prominence
suggesting mild interstitial edema.

3.  Low lung volumes with left base atelectasis/infiltrate.

## 2020-07-04 MED ORDER — METHOCARBAMOL 500 MG PO TABS
500.0000 mg | ORAL_TABLET | Freq: Three times a day (TID) | ORAL | Status: DC
  Administered 2020-07-04 – 2020-07-10 (×19): 500 mg via ORAL
  Filled 2020-07-04 (×19): qty 1

## 2020-07-04 MED ORDER — ENOXAPARIN SODIUM 40 MG/0.4ML IJ SOSY
40.0000 mg | PREFILLED_SYRINGE | Freq: Every day | INTRAMUSCULAR | Status: DC
  Administered 2020-07-04 – 2020-07-09 (×6): 40 mg via SUBCUTANEOUS
  Filled 2020-07-04 (×6): qty 0.4

## 2020-07-04 MED ORDER — ALBUMIN HUMAN 5 % IV SOLN
12.5000 g | INTRAVENOUS | Status: AC
  Administered 2020-07-04 (×2): 12.5 g via INTRAVENOUS
  Filled 2020-07-04 (×2): qty 250

## 2020-07-04 MED ORDER — CHLORHEXIDINE GLUCONATE 0.12 % MT SOLN
15.0000 mL | Freq: Two times a day (BID) | OROMUCOSAL | Status: DC
  Administered 2020-07-04 – 2020-07-10 (×13): 15 mL via OROMUCOSAL
  Filled 2020-07-04 (×11): qty 15

## 2020-07-04 MED ORDER — LIDOCAINE 5 % EX PTCH
2.0000 | MEDICATED_PATCH | CUTANEOUS | Status: DC
  Administered 2020-07-04: 2 via TRANSDERMAL
  Administered 2020-07-05 (×2): 1 via TRANSDERMAL
  Administered 2020-07-06 – 2020-07-10 (×5): 2 via TRANSDERMAL
  Filled 2020-07-04 (×8): qty 2

## 2020-07-04 MED ORDER — AMLODIPINE BESYLATE 5 MG PO TABS
5.0000 mg | ORAL_TABLET | Freq: Every day | ORAL | Status: DC
  Administered 2020-07-04 – 2020-07-06 (×3): 5 mg via ORAL
  Filled 2020-07-04 (×3): qty 1

## 2020-07-04 MED ORDER — ORAL CARE MOUTH RINSE
15.0000 mL | Freq: Two times a day (BID) | OROMUCOSAL | Status: DC
  Administered 2020-07-04 – 2020-07-10 (×10): 15 mL via OROMUCOSAL

## 2020-07-04 MED ORDER — INSULIN ASPART 100 UNIT/ML IJ SOLN
0.0000 [IU] | INTRAMUSCULAR | Status: DC
  Administered 2020-07-04: 2 [IU] via SUBCUTANEOUS

## 2020-07-04 NOTE — Progress Notes (Signed)
Progress Note  Patient Name: Wyatt Donovan Date of Encounter: 07/04/2020  [facility] Cardiologist: None   Subjective   Blood-streaked nasal drainage.  No chest pain.  Denies shortness of breath.  Has been accepted at for surgery by Dr. Kipp Brood.  Patient will be high risk for surgical/postop heart failure.  Has viable anterior wall and indeterminate lateral wall (which is the current/presenting infarct related territory).  Inpatient Medications    Scheduled Meds: . acetaminophen  1,000 mg Oral Q6H   Or  . acetaminophen (TYLENOL) oral liquid 160 mg/5 mL  1,000 mg Per Tube Q6H  . aspirin EC  325 mg Oral Daily   Or  . aspirin  324 mg Per Tube Daily  . atorvastatin  40 mg Oral Daily  . bisacodyl  10 mg Oral Daily   Or  . bisacodyl  10 mg Rectal Daily  . chlorhexidine  15 mL Mouth Rinse BID  . Chlorhexidine Gluconate Cloth  6 each Topical Daily  . docusate sodium  200 mg Oral Daily  . insulin aspart  0-24 Units Subcutaneous Q4H  . mouth rinse  15 mL Mouth Rinse q12n4p  . metoprolol tartrate  12.5 mg Oral BID   Or  . metoprolol tartrate  12.5 mg Per Tube BID  . [START ON 07/05/2020] pantoprazole  40 mg Oral Daily  . sodium chloride flush  10-40 mL Intracatheter Q12H  . sodium chloride flush  3 mL Intravenous Q12H   Continuous Infusions: . sodium chloride 10 mL/hr at 07/04/20 0900  . sodium chloride 250 mL (07/04/20 0534)  . sodium chloride    . albumin human    . amiodarone 30 mg/hr (07/04/20 0900)  .  ceFAZolin (ANCEF) IV Stopped (07/04/20 0545)  . dexmedetomidine (PRECEDEX) IV infusion Stopped (07/03/20 2137)  . DOBUTamine 2.5 mcg/kg/min (07/04/20 0900)  . insulin Stopped (07/03/20 2123)  . lactated ringers    . lactated ringers    . lactated ringers 10 mL/hr at 07/04/20 0900  . niCARDipine    . nitroGLYCERIN 3 mcg/min (07/04/20 0900)  . norepinephrine (LEVOPHED) Adult infusion    . phenylephrine (NEO-SYNEPHRINE) Adult infusion 50 mcg/min (07/04/20 0900)    PRN Meds: sodium chloride, dextrose, lactated ringers, metoprolol tartrate, midazolam, morphine injection, ondansetron (ZOFRAN) IV, sodium chloride flush, sodium chloride flush, traMADol   Vital Signs    Vitals:   07/04/20 0600 07/04/20 0700 07/04/20 0801 07/04/20 0805  BP: (!) 73/40 98/73    Pulse: (!) 107 91 91 94  Resp: (!) 25 19 17 19   Temp: 99.1 F (37.3 C) 98.96 F (37.2 C) 98.78 F (37.1 C) 98.78 F (37.1 C)  TempSrc:      SpO2: 93% 94% 94% 94%  Weight:      Height:        Intake/Output Summary (Last 24 hours) at 07/04/2020 0946 Last data filed at 07/04/2020 0900 Gross per 24 hour  Intake 6092.24 ml  Output 3205 ml  Net 2887.24 ml   Last 3 Weights 07/04/2020 07/03/2020 07/02/2020  Weight (lbs) 189 lb 13.1 oz 171 lb 4.8 oz 176 lb 5.9 oz  Weight (kg) 86.1 kg 77.7 kg 80 kg      Telemetry    Normal sinus rhythm with occasional PVCs.- Personally Reviewed  ECG    ECG 07/04/2020 demonstrates normal sinus rhythm without acute ST-T wave change.  Lateral Q waves and tall R wave V1 consistent with posterolateral infarction..- Personally Reviewed  Physical Exam  Terrible dentition GEN:  Pale appearing Neck:  Unable to assess Cardiac:  Multicompartment pericardial rub Respiratory:  Anterior lungs are clear. Neuro:  Nonfocal and able to have appropriate conversation. PsychCriss Rosales affect  Labs    High Sensitivity Troponin:   Recent Labs  Lab 06/29/20 0357 06/29/20 0549 06/29/20 1437 06/30/20 0544  TROPONINIHS 74* 366* 25,186* >27,000*      Chemistry Recent Labs  Lab 06/29/20 0357 07/01/20 0044 07/03/20 0401 07/03/20 0913 07/03/20 1343 07/03/20 1348 07/03/20 2108 07/04/20 0327  NA 144   < > 139   < > 139 140 137 136  K 4.0   < > 3.5   < > 4.1 4.1 4.6 4.2  CL 109   < > 109   < > 105  --  110 107  CO2 24   < > 26  --   --   --  23 22  GLUCOSE 103*   < > 100*   < > 124*  --  114* 119*  BUN 15   < > <5*   < > 4*  --  5* <5*  CREATININE 0.96   < > 0.85   < >  0.50*  --  0.68 0.73  CALCIUM 9.1   < > 8.0*  --   --   --  7.6* 7.7*  PROT 6.4*  --  5.6*  --   --   --   --   --   ALBUMIN 3.6  --  2.3*  --   --   --   --   --   AST 36  --  59*  --   --   --   --   --   ALT 44  --  33  --   --   --   --   --   ALKPHOS 69  --  71  --   --   --   --   --   BILITOT 0.7  --  0.5  --   --   --   --   --   GFRNONAA >60   < > >60  --   --   --  >60 >60  ANIONGAP 11   < > 4*  --   --   --  4* 7   < > = values in this interval not displayed.     Hematology Recent Labs  Lab 07/03/20 1532 07/03/20 2108 07/04/20 0327  WBC 8.7 7.0 7.8  RBC 3.55* 3.08* 3.12*  HGB 11.1* 9.8* 9.8*  HCT 33.9* 29.6* 30.0*  MCV 95.5 96.1 96.2  MCH 31.3 31.8 31.4  MCHC 32.7 33.1 32.7  RDW 13.6 13.8 14.1  PLT 118* 116* 123*    BNP Recent Labs  Lab 06/29/20 0712  BNP 129.9*     DDimer No results for input(s): DDIMER in the last 168 hours.   Radiology    DG Chest Port 1 View  Result Date: 07/04/2020 CLINICAL DATA:  Chest tube.  Open-heart surgery. EXAM: PORTABLE CHEST 1 VIEW COMPARISON:  07/03/2020. FINDINGS: Interim extubation and NG tube removal. Swan-Ganz catheter in stable position with tip in the left pulmonary artery. Mediastinal drainage catheters and left chest tube in stable position. Prior CABG. Cardiomegaly with mild pulmonary venous congestion. Mild bibasilar interstitial prominence suggesting i mild nterstitial edema. Low lung volumes. Left base atelectasis/infiltrate. No prominent pleural effusion. No pneumothorax. IMPRESSION: 1. Interim extubation and removal of NG tube. Swan-Ganz catheter, mediastinal drainage catheter, left  chest tube in stable position. No pneumothorax. 2. Prior CABG. Cardiomegaly. Mild bibasilar interstitial prominence suggesting mild interstitial edema. 3.  Low lung volumes with left base atelectasis/infiltrate. Electronically Signed   By: Marcello Moores  Register   On: 07/04/2020 06:58   DG Chest Port 1 View  Result Date: 07/03/2020 CLINICAL  DATA:  Evaluate for pneumothorax EXAM: PORTABLE CHEST 1 VIEW COMPARISON:  06/29/2020 FINDINGS: Cardiac shadow is mildly prominent but accentuated by the portable technique. Postsurgical changes are now seen. Swan-Ganz catheter is noted in the left pulmonary artery. Endotracheal tube and gastric catheter are seen in satisfactory position. Mediastinal drain is noted. Left thoracostomy tube is noted as well. No focal infiltrate or effusion is seen. No pneumothorax is noted. IMPRESSION: Postsurgical change with tubes and lines as described. No pneumothorax is identified. Electronically Signed   By: Inez Catalina M.D.   On: 07/03/2020 15:45   VAS US DOPPLER PRE CABG  Result Date: 07/02/2020 PREOPERATIVE VASCULAR EVALUATION Patient Name:  Wyatt Donovan  Date of Exam:   07/02/2020 Medical Rec #: [id number]         Accession #:    [id number] Date of Birth: 08-06-1985         Patient Gender: M Patient Age:   15Y Exam Location:  [facility] Procedure:      VAS US DOPPLER PRE CABG Referring Phys: [id number] HARRELL O LIGHTFOOT --------------------------------------------------------------------------------  Indications:   Pre-CABG. Risk Factors:  Hyperlipidemia, current smoker, prior MI, coronary artery                disease. Other Factors: Stent - 2012 & CHF. Performing Technologist: Rogelia Rohrer  Examination Guidelines: A complete evaluation includes B-mode imaging, spectral Doppler, color Doppler, and power Doppler as needed of all accessible portions of each vessel. Bilateral testing is considered an integral part of a complete examination. Limited examinations for reoccurring indications may be performed as noted.  Right Carotid Findings: +----------+--------+--------+--------+--------+------------------+           PSV cm/sEDV cm/sStenosisDescribeComments           +----------+--------+--------+--------+--------+------------------+ CCA Prox  107     21                      intimal thickening  +----------+--------+--------+--------+--------+------------------+ CCA Distal90      27                      intimal thickening +----------+--------+--------+--------+--------+------------------+ ICA Prox  53      26                                         +----------+--------+--------+--------+--------+------------------+ ICA Distal81      42                                         +----------+--------+--------+--------+--------+------------------+ ECA       111     14                                         +----------+--------+--------+--------+--------+------------------+ Portions of this table do not appear on this page. +----------+--------+-------+----------------+------------+           PSV  cm/sEDV cmsDescribe        Arm Pressure +----------+--------+-------+----------------+------------+ [id number]            Multiphasic, WNL             +----------+--------+-------+----------------+------------+ +---------+--------+--+--------+--+---------+ VertebralPSV cm/s54EDV cm/s27Antegrade +---------+--------+--+--------+--+---------+ Left Carotid Findings: +----------+--------+--------+--------+--------+------------------+           PSV cm/sEDV cm/sStenosisDescribeComments           +----------+--------+--------+--------+--------+------------------+ CCA Prox  135     28                      intimal thickening +----------+--------+--------+--------+--------+------------------+ CCA Distal83      33                      intimal thickening +----------+--------+--------+--------+--------+------------------+ ICA Prox  55      27                                         +----------+--------+--------+--------+--------+------------------+ ICA Distal82      40                                         +----------+--------+--------+--------+--------+------------------+ ECA       81      14                                          +----------+--------+--------+--------+--------+------------------+ +----------+--------+--------+----------------+------------+ SubclavianPSV cm/sEDV cm/sDescribe        Arm Pressure +----------+--------+--------+----------------+------------+           113             Multiphasic, WNL             +----------+--------+--------+----------------+------------+ +---------+--------+--+--------+--+---------+ VertebralPSV cm/s56EDV cm/s27Antegrade +---------+--------+--+--------+--+---------+  ABI Findings: +--------+------------------+-----+---------+--------+ Right   Rt Pressure (mmHg)IndexWaveform Comment  +--------+------------------+-----+---------+--------+ [id number]                    triphasic         +--------+------------------+-----+---------+--------+ PTA     116               1.09 triphasic         +--------+------------------+-----+---------+--------+ PERO    122               1.15 triphasic         +--------+------------------+-----+---------+--------+ +--------+------------------+-----+---------+-------+ Left    Lt Pressure (mmHg)IndexWaveform Comment +--------+------------------+-----+---------+-------+ [id number]                    triphasic        +--------+------------------+-----+---------+-------+ PTA     130               1.23 triphasic        +--------+------------------+-----+---------+-------+ PERO    125               1.18 triphasic        +--------+------------------+-----+---------+-------+  Right Doppler Findings: +--------+--------+-----+---------+--------+ Site    PressureIndexDoppler  Comments +--------+--------+-----+---------+--------+ [id number]          triphasic         +--------+--------+-----+---------+--------+ Radial  triphasic         +--------+--------+-----+---------+--------+ Ulnar                triphasic         +--------+--------+-----+---------+--------+  Left  Doppler Findings: +----------+--------+-----+---------+--------+ Site      PressureIndexDoppler  Comments +----------+--------+-----+---------+--------+ [id number]          triphasic         +----------+--------+-----+---------+--------+ Brachial  103          triphasic         +----------+--------+-----+---------+--------+ Radial                 triphasic         +----------+--------+-----+---------+--------+ Ulnar                  triphasic         +----------+--------+-----+---------+--------+  Summary: Right Carotid: The extracranial vessels were near-normal with only minimal wall                thickening or plaque. Left Carotid: The extracranial vessels were near-normal with only minimal wall               thickening or plaque. Vertebrals:  Bilateral vertebral arteries demonstrate antegrade flow. Subclavians: Normal flow hemodynamics were seen in bilateral subclavian              arteries. Right ABI: Resting right ankle-brachial index is within normal range. No evidence of significant right lower extremity arterial disease. Left ABI: Resting left ankle-brachial index is within normal range. No evidence of significant left lower extremity arterial disease. Right Upper Extremity: Normal PPG waveforms with radial artery compression suggest palmar arch patency. Doppler waveforms remain within normal limits with right radial compression. Doppler waveforms remain within normal limits with right ulnar compression. Left Upper Extremity: Abnormal PPG waveforms with radial artery compression suggest an incomplete palmar arch. Doppler waveforms decrease 50% with left radial compression. Doppler waveforms remain within normal limits with left ulnar compression.  Electronically signed by Gretta Beganodd Early MD on 07/02/2020 at 8:14:29 PM.    Final     Cardiac Studies    MRI report from study performed 07/02/2020: IMPRESSION: 1. Acute MI in the in the basal to  mid inferoseptal/inferior/inferolateral walls and apical lateral wall, with transmural LGE and areas of microvascular obstruction vs intramyocardial hemorrhage. Unable to assess this territory for viability in setting of acute MI  2. LAD territory appears viable. There is subendocardial LGE consistent with prior infarct in mid anteroseptal, apical septal/inferior walls and apex. LGE is greater than 50% transmural suggesting nonviability only at the very distal apex.  3.  LV apical thrombus measures 6mm x 5mm  4. Mild LV dilatation with severe systolic dysfunction (EF 27%). Akinesis of basal to mid inferior/inferoseptal/inferolateral, mid anterolateral, and apical inferior/septal/lateral walls.  5.  Normal RV size and systolic function (EF 57%)   Cardiac cath 07/01/2020: Diagnostic Dominance: Co-dominant   LVEDP 13 at the time of coronary angiography.   Patient Profile     35 y.o. male with a very early onset CAD and remote anterior STEMI with LAD stent at age 35, presenting with unstable angina, evolved to posterior wall infarction and mild reduction in overall LVEF.  Echocardiogram shows a possible apical left ventricular thrombus, but this appears to be organized possibly calcified.  MRI demonstrates viable anterior wall indeterminate lateral wall and nonviable inferior wall.  Four-vessel CABG 07/03/2020 with LIMA to LAD, free radial to circumflex, SVG to  diagonal, and SVG to PDA.  Assessment & Plan    1. Acute inferoposterior myocardial infarction with severe three-vessel CAD substrate: Now status post multivessel CABG with, right radial access, SVG to PDA, SVG to diagonal. 2. Tobacco abuse: We will encourage to discontinue. 3. Dyslipidemia: Lipids were not ideal on admission despite statin therapy.  LP(a) is also elevated.  Continue high intensity statin therapy. 4. Acute on chronic combined systolic and diastolic heart failure: Still requiring pressors.  Hopefully there will  be significant improvement in LV function over time.  Will need GDMT started prior to discharge. 5. LV thrombus: May possibly need 3 months of Coumadin therapy when further removed from surgery.  So far doing well.  Observe for low output.  For questions or updates, please contact [place] Please consult [email or web address] for contact info under        Signed, Sinclair Grooms, MD  07/04/2020, 9:46 AM

## 2020-07-04 NOTE — Progress Notes (Signed)
[address]       [place] [address]             [phone]                 1 Day Post-Op Procedure(s) (LRB): CORONARY ARTERY BYPASS GRAFTING (CABG) times four using left internal mammary artery, right arm radial artery and right leg saphenous vein (N/A) RADIAL ARTERY HARVEST (Right) TRANSESOPHAGEAL ECHOCARDIOGRAM (TEE) (N/A)   Events: No events Extubated overnight _______________________________________________________________ Vitals: BP 108/80   Pulse 92   Temp 98.78 F (37.1 C)   Resp 16   Ht 6\' 1"  (1.854 m)   Wt 86.1 kg   SpO2 96%   BMI 25.04 kg/m   - Neuro: alert NAD  - Cardiovascular: sinus  Drips: amio.  50 neo, 2.5 dob PAP: (21-40)/(11-29) 30/23 CVP:  [11 mmHg-16 mmHg] 16 mmHg CO:  [3 L/min-4.6 L/min] 3.6 L/min CI:  [1.5 L/min/m2-2.3 L/min/m2] 1.8 L/min/m2  - Pulm: EWOB Vent Mode: SIMV;PRVC;PSV FiO2 (%):  [40 %-50 %] 40 % Set Rate:  [4 bmp-12 bmp] 4 bmp Vt Set:  [640 mL] 640 mL PEEP:  [5 cmH20] 5 cmH20 Pressure Support:  [10 cmH20] 10 cmH20 Plateau Pressure:  [14 cmH20-20 cmH20] 14 cmH20  ABG    Component Value Date/Time   PHART 7.407 07/03/2020 1348   PCO2ART 39.2 07/03/2020 1348   PO2ART 148 (H) 07/03/2020 1348   HCO3 24.7 07/03/2020 1348   TCO2 26 07/03/2020 1348   O2SAT 99.0 07/03/2020 1348    - Abd: ND - Extremity: warm  .Intake/Output      05/04 0701 05/05 0700 05/05 0701 05/06 0700   P.O.     I.V. (mL/kg) 4482.6 (52.1) 265.6 (3.1)   Blood 250    IV Piggyback 1444    Total Intake(mL/kg) 6176.7 (71.7) 265.6 (3.1)   Urine (mL/kg/hr) 2190 (1.1) 20 (0.1)   Emesis/NG output 75    Stool     Blood 400    Chest Tube 490 30   Total Output 3155 50   Net +3021.7 +215.6           _______________________________________________________________ Labs: CBC Latest Ref Rng & Units 07/04/2020 07/03/2020 07/03/2020  WBC 4.0 - 10.5 K/uL 7.8 7.0 8.7  Hemoglobin 13.0 - 17.0 g/dL 9.8(L) 9.8(L) 11.1(L)  Hematocrit 39.0 -  52.0 % 30.0(L) 29.6(L) 33.9(L)  Platelets 150 - 400 K/uL 123(L) 116(L) 118(L)   CMP Latest Ref Rng & Units 07/04/2020 07/03/2020 07/03/2020  Glucose 70 - 99 mg/dL 119(H) 114(H) -  BUN 6 - 20 mg/dL <5(L) 5(L) -  Creatinine 0.61 - 1.24 mg/dL 0.73 0.68 -  Sodium 135 - 145 mmol/L 136 137 140  Potassium 3.5 - 5.1 mmol/L 4.2 4.6 4.1  Chloride 98 - 111 mmol/L 107 110 -  CO2 22 - 32 mmol/L 22 23 -  Calcium 8.9 - 10.3 mg/dL 7.7(L) 7.6(L) -  Total Protein 6.5 - 8.1 g/dL - - -  Total Bilirubin 0.3 - 1.2 mg/dL - - -  Alkaline Phos 38 - 126 U/L - - -  AST 15 - 41 U/L - - -  ALT 0 - 44 U/L - - -    CXR: PV congestion  _______________________________________________________________  Assessment and Plan: POD 1 s/p CABG  Neuro: pain controlled.  Adding robaxin, and lidocaine patches CV: on Norvasc for radial harvest.  Weaning neo.  On a/statin.  Low CO, and SV.  Giving albumin.  Will  keep dobutamine for now. Pulm: pulm toilet.  Will keep CT Renal: creat stable GI: advancing diet Heme: stable ID: [email or web address] Endo: SSI Dispo: continue ICU care   Lajuana Matte 07/04/2020 10:34 AM

## 2020-07-04 NOTE — Procedures (Signed)
Extubation Procedure Note  Patient Details:   Name: Wyatt Donovan DOB: [birth year] MRN: [id number]   Airway Documentation:    Vent end date: 07/04/20 Vent end time: 0040   Evaluation  O2 sats: stable throughout Complications: No apparent complications Patient did tolerate procedure well. Bilateral Breath Sounds: Clear   Yes   Patient performed a VC of 1.2 L , NIF -25, positive cuff leak, unable to hold head off pillow but ok per Wyatt Donovan. Patient extubated to 4 L [place] ; IS 500 mL  Carrington Clamp A 07/04/2020, 12:50 AM

## 2020-07-05 ENCOUNTER — Inpatient Hospital Stay (HOSPITAL_COMMUNITY): Payer: Medicaid Other

## 2020-07-05 LAB — POCT I-STAT 7, (LYTES, BLD GAS, ICA,H+H)
Acid-base deficit: 3 mmol/L — ABNORMAL HIGH (ref 0.0–2.0)
Acid-base deficit: 4 mmol/L — ABNORMAL HIGH (ref 0.0–2.0)
Acid-base deficit: 4 mmol/L — ABNORMAL HIGH (ref 0.0–2.0)
Bicarbonate: 20.5 mmol/L (ref 20.0–28.0)
Bicarbonate: 20.7 mmol/L (ref 20.0–28.0)
Bicarbonate: 22.5 mmol/L (ref 20.0–28.0)
Calcium, Ion: 1.13 mmol/L — ABNORMAL LOW (ref 1.15–1.40)
Calcium, Ion: 1.17 mmol/L (ref 1.15–1.40)
Calcium, Ion: 1.25 mmol/L (ref 1.15–1.40)
HCT: 26 % — ABNORMAL LOW (ref 39.0–52.0)
HCT: 28 % — ABNORMAL LOW (ref 39.0–52.0)
HCT: 33 % — ABNORMAL LOW (ref 39.0–52.0)
Hemoglobin: 11.2 g/dL — ABNORMAL LOW (ref 13.0–17.0)
Hemoglobin: 8.8 g/dL — ABNORMAL LOW (ref 13.0–17.0)
Hemoglobin: 9.5 g/dL — ABNORMAL LOW (ref 13.0–17.0)
O2 Saturation: 97 %
O2 Saturation: 97 %
O2 Saturation: 99 %
Patient temperature: 36.9
Patient temperature: 37.9
Patient temperature: 38.3
Potassium: 3.8 mmol/L (ref 3.5–5.1)
Potassium: 4.1 mmol/L (ref 3.5–5.1)
Potassium: 4.3 mmol/L (ref 3.5–5.1)
Sodium: 138 mmol/L (ref 135–145)
Sodium: 140 mmol/L (ref 135–145)
Sodium: 141 mmol/L (ref 135–145)
TCO2: 22 mmol/L (ref 22–32)
TCO2: 22 mmol/L (ref 22–32)
TCO2: 24 mmol/L (ref 22–32)
pCO2 arterial: 34.6 mmHg (ref 32.0–48.0)
pCO2 arterial: 35.3 mmHg (ref 32.0–48.0)
pCO2 arterial: 38.8 mmHg (ref 32.0–48.0)
pH, Arterial: 7.37 (ref 7.350–7.450)
pH, Arterial: 7.382 (ref 7.350–7.450)
pH, Arterial: 7.385 (ref 7.350–7.450)
pO2, Arterial: 140 mmHg — ABNORMAL HIGH (ref 83.0–108.0)
pO2, Arterial: 91 mmHg (ref 83.0–108.0)
pO2, Arterial: 97 mmHg (ref 83.0–108.0)

## 2020-07-05 LAB — CBC
HCT: 30.3 % — ABNORMAL LOW (ref 39.0–52.0)
Hemoglobin: 9.7 g/dL — ABNORMAL LOW (ref 13.0–17.0)
MCH: 31.1 pg (ref 26.0–34.0)
MCHC: 32 g/dL (ref 30.0–36.0)
MCV: 97.1 fL (ref 80.0–100.0)
Platelets: 146 10*3/uL — ABNORMAL LOW (ref 150–400)
RBC: 3.12 MIL/uL — ABNORMAL LOW (ref 4.22–5.81)
RDW: 14.7 % (ref 11.5–15.5)
WBC: 11.8 10*3/uL — ABNORMAL HIGH (ref 4.0–10.5)
nRBC: 0 % (ref 0.0–0.2)

## 2020-07-05 LAB — BASIC METABOLIC PANEL
Anion gap: 5 (ref 5–15)
BUN: 6 mg/dL (ref 6–20)
CO2: 24 mmol/L (ref 22–32)
Calcium: 7.7 mg/dL — ABNORMAL LOW (ref 8.9–10.3)
Chloride: 105 mmol/L (ref 98–111)
Creatinine, Ser: 0.73 mg/dL (ref 0.61–1.24)
GFR, Estimated: >60 mL/min (ref >60)
Glucose, Bld: 109 mg/dL — ABNORMAL HIGH (ref 70–99)
Potassium: 4.3 mmol/L (ref 3.5–5.1)
Sodium: 134 mmol/L — ABNORMAL LOW (ref 135–145)

## 2020-07-05 LAB — GLUCOSE, CAPILLARY
Glucose-Capillary: 103 mg/dL — ABNORMAL HIGH (ref 70–99)
Glucose-Capillary: 102 mg/dL — ABNORMAL HIGH (ref 70–99)
Glucose-Capillary: 83 mg/dL (ref 70–99)
Glucose-Capillary: 97 mg/dL (ref 70–99)
Glucose-Capillary: 95 mg/dL (ref 70–99)

## 2020-07-05 IMAGING — DX DG CHEST 1V PORT
1 series · 1 of 1 positions shown · non-contrast
Comparison: [DATE]

CLINICAL DATA: Swan-Ganz catheter removal

EXAM:
PORTABLE CHEST 1 VIEW

[chest]
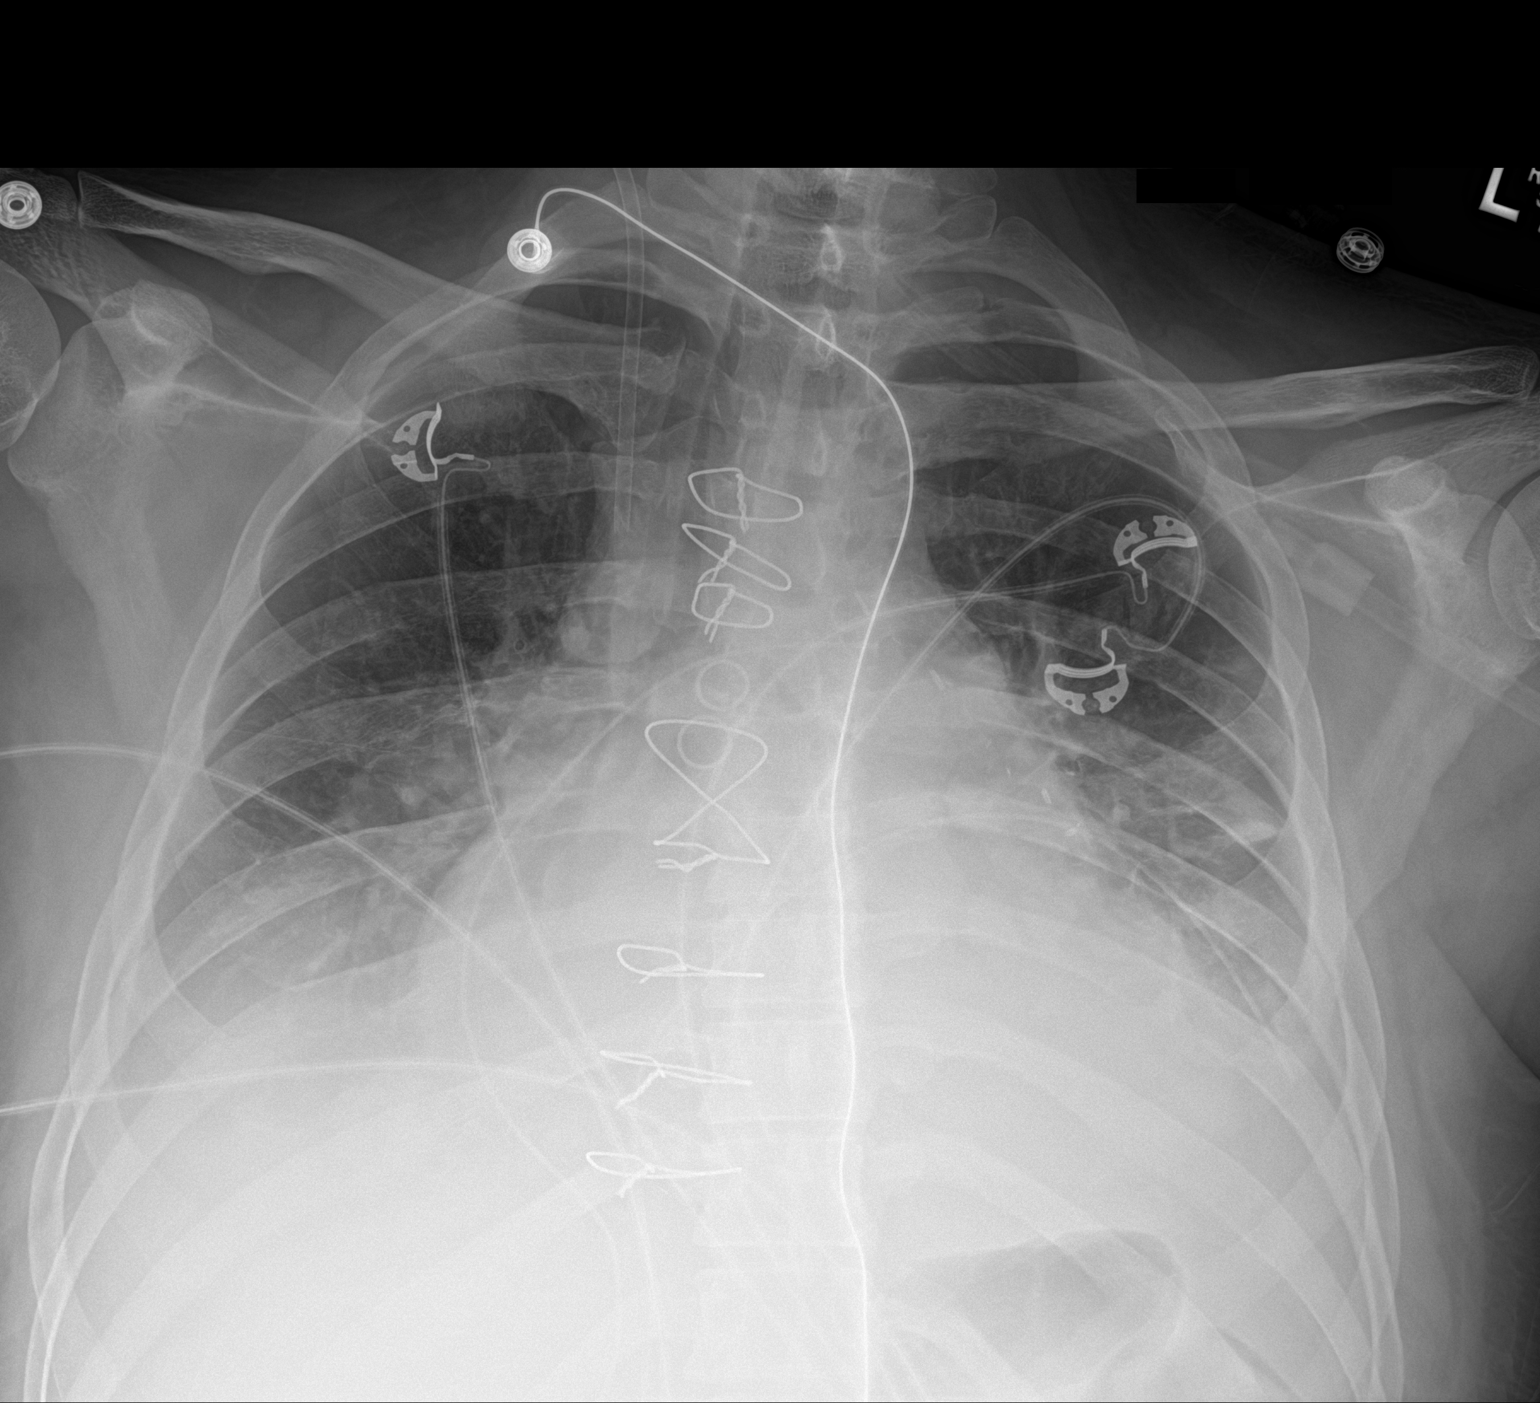

[1 of 1 positions shown; findings below may reference images not displayed]

FINDINGS: Swan-Ganz catheter has been removed. Cordis tip is in the superior
vena cava. Persistent mediastinal drains and left chest tube. No
appreciable pneumothorax.

There is cardiomegaly with pulmonary vascularity within normal
limits. There are pleural effusions bilaterally with bibasilar
atelectatic change. Status post coronary artery bypass grafting. No
adenopathy. No bone lesions.
IMPRESSION: Tube and catheter positions as described without pneumothorax.
Persistent cardiomegaly with pleural effusions bilaterally and
bibasilar atelectasis. Status post coronary artery bypass grafting.

## 2020-07-05 MED ORDER — POTASSIUM CHLORIDE CRYS ER 20 MEQ PO TBCR
40.0000 meq | EXTENDED_RELEASE_TABLET | Freq: Every day | ORAL | Status: DC
  Administered 2020-07-05 – 2020-07-07 (×3): 40 meq via ORAL
  Filled 2020-07-05 (×3): qty 2

## 2020-07-05 MED ORDER — FUROSEMIDE 40 MG PO TABS
40.0000 mg | ORAL_TABLET | Freq: Every day | ORAL | Status: DC
  Administered 2020-07-05: 40 mg via ORAL
  Filled 2020-07-05: qty 1

## 2020-07-05 MED ORDER — DOBUTAMINE IN D5W 4-5 MG/ML-% IV SOLN: 2.5000 ug/kg/min | INTRAVENOUS | Status: DC

## 2020-07-05 MED ORDER — FUROSEMIDE 10 MG/ML IJ SOLN
40.0000 mg | Freq: Once | INTRAMUSCULAR | Status: AC
  Administered 2020-07-05: 40 mg via INTRAVENOUS
  Filled 2020-07-05: qty 4

## 2020-07-05 MED ORDER — DOBUTAMINE IN D5W 4-5 MG/ML-% IV SOLN
2.5000 ug/kg/min | INTRAVENOUS | Status: DC
  Administered 2020-07-07: 7.5 ug/kg/min via INTRAVENOUS
  Filled 2020-07-05: qty 250

## 2020-07-05 MED FILL — Nitroglycerin IV Soln 100 MCG/ML in D5W: INTRA_ARTERIAL | Qty: 10 | Status: AC

## 2020-07-05 MED FILL — Verapamil HCl IV Soln 2.5 MG/ML: INTRAVENOUS | Qty: 2 | Status: AC

## 2020-07-05 NOTE — Progress Notes (Signed)
TCTS BRIEF SICU PROGRESS NOTE  2 Days Post-Op  S/P Procedure(s) (LRB): CORONARY ARTERY BYPASS GRAFTING (CABG) times four using left internal mammary artery, right arm radial artery and right leg saphenous vein (N/A) RADIAL ARTERY HARVEST (Right) TRANSESOPHAGEAL ECHOCARDIOGRAM (TEE) (N/A)   Overall stable day although requiring NRB facemask for increased O2 requirements NSR w/ stable although marginal BP on dobutamine UOP > 100 mL/hr  Plan: Continue current plan  Rexene Alberts, MD 07/05/2020 6:46 PM

## 2020-07-05 NOTE — Progress Notes (Signed)
   Awake, alert, today than yesterday.  Mild shortness of breath.  Pressures have been significantly, now remaining only on IV dobutamine.  Volume overload.  Diuresis is planned with he was not.  As clinical state improves, GDMT for systolic dysfunction be instituted based upon residual LV systolic.

## 2020-07-05 NOTE — Progress Notes (Signed)
Pt experienced increasing dyspnea after ambulation with oxygen saturations between 78-85% despite HFNC being at 15 L. Pt was switched to Nonrebreather mask at 15 L which raised oxygen saturation into the 90s, but then began to drop into the 80s again with a respiratory rate of 29-38. Dobutamine titrated to 5 mcg and TCTS paged. Spoke to [employer] PA on the phone and received orders for IV Lasix and verbal order passed on from Dr. Kipp Brood to adjust Dobutamine parameters to a maximum of 7.5 mcg. The drip was adjusted accordingly. Pt's oxygenation now in the low 90s, and he reports decreased SOB. Will continue to monitor.

## 2020-07-05 NOTE — Progress Notes (Signed)
      [address]       [place] [address]             [phone]                 2 Days Post-Op Procedure(s) (LRB): CORONARY ARTERY BYPASS GRAFTING (CABG) times four using left internal mammary artery, right arm radial artery and right leg saphenous vein (N/A) RADIAL ARTERY HARVEST (Right) TRANSESOPHAGEAL ECHOCARDIOGRAM (TEE) (N/A)   Events: No events  _______________________________________________________________ Vitals: BP 99/86 (BP Location: Right Arm)   Pulse 98   Temp 98.2 F (36.8 C) (Oral)   Resp (!) 21   Ht 6\' 1"  (1.854 m)   Wt 86.4 kg   SpO2 90%   BMI 25.13 kg/m   - Neuro: alert NAD  - Cardiovascular: sinus  Drips: amio.  2.5 dob PAP: (28-40)/(20-27) 40/22 CVP:  [14 mmHg-20 mmHg] 14 mmHg CO:  [3.8 L/min-4.2 L/min] 4.2 L/min CI:  [1.9 L/min/m2-2.1 L/min/m2] 2.1 L/min/m2  - Pulm: EWOB    ABG    Component Value Date/Time   PHART 7.385 07/04/2020 0258   PCO2ART 34.6 07/04/2020 0258   PO2ART 91 07/04/2020 0258   HCO3 20.5 07/04/2020 0258   TCO2 22 07/04/2020 0258   ACIDBASEDEF 4.0 (H) 07/04/2020 0258   O2SAT 97.0 07/04/2020 0258    - Abd: ND - Extremity: warm  .Intake/Output      05/05 0701 05/06 0700 05/06 0701 05/07 0700   I.V. (mL/kg) 908.4 (10.5)    Blood     IV Piggyback 299.9    Total Intake(mL/kg) 1208.3 (14)    Urine (mL/kg/hr) 580 (0.3) 125 (0.4)   Emesis/NG output     Blood     Chest Tube 120 70   Total Output 700 195   Net +508.3 -195           _______________________________________________________________ Labs: CBC Latest Ref Rng & Units 07/05/2020 07/04/2020 07/04/2020  WBC 4.0 - 10.5 K/uL 11.8(H) 9.7 7.8  Hemoglobin 13.0 - 17.0 g/dL 9.7(L) 9.4(L) 9.8(L)  Hematocrit 39.0 - 52.0 % 30.3(L) 29.5(L) 30.0(L)  Platelets 150 - 400 K/uL 146(L) 133(L) 123(L)   CMP Latest Ref Rng & Units 07/05/2020 07/04/2020 07/04/2020  Glucose 70 - 99 mg/dL 109(H) 125(H) 119(H)  BUN 6 - 20 mg/dL 6 5(L) <5(L)  Creatinine 0.61 -  1.24 mg/dL 0.73 0.75 0.73  Sodium 135 - 145 mmol/L 134(L) 136 136  Potassium 3.5 - 5.1 mmol/L 4.3 4.4 4.2  Chloride 98 - 111 mmol/L 105 106 107  CO2 22 - 32 mmol/L 24 23 22   Calcium 8.9 - 10.3 mg/dL 7.7(L) 7.6(L) 7.7(L)  Total Protein 6.5 - 8.1 g/dL - - -  Total Bilirubin 0.3 - 1.2 mg/dL - - -  Alkaline Phos 38 - 126 U/L - - -  AST 15 - 41 U/L - - -  ALT 0 - 44 U/L - - -    CXR: PV congestion, B effusions  _______________________________________________________________  Assessment and Plan: POD 12s/p CABG  Neuro: pain controlled.  on robaxin, and lidocaine patches CV: on Norvasc for radial harvest.  Will keep dob for now.  On a/statin.   Pulm: pulm toilet.  Will keep CT until pt ambulates more.  B effusion on today's xray Renal: creat stable.  Will diurese today GI: on diet Heme: stable ID: afebrile Endo: SSI Dispo: continue ICU care   Lajuana Matte 07/05/2020 10:19 AM

## 2020-07-06 ENCOUNTER — Inpatient Hospital Stay (HOSPITAL_COMMUNITY): Payer: Medicaid Other

## 2020-07-06 LAB — BASIC METABOLIC PANEL
Anion gap: 7 (ref 5–15)
BUN: 9 mg/dL (ref 6–20)
CO2: 26 mmol/L (ref 22–32)
Calcium: 7.8 mg/dL — ABNORMAL LOW (ref 8.9–10.3)
Chloride: 102 mmol/L (ref 98–111)
Creatinine, Ser: 0.73 mg/dL (ref 0.61–1.24)
GFR, Estimated: >60 mL/min (ref >60)
Glucose, Bld: 82 mg/dL (ref 70–99)
Potassium: 3.8 mmol/L (ref 3.5–5.1)
Sodium: 135 mmol/L (ref 135–145)

## 2020-07-06 LAB — CBC
HCT: 32.3 % — ABNORMAL LOW (ref 39.0–52.0)
Hemoglobin: 10.5 g/dL — ABNORMAL LOW (ref 13.0–17.0)
MCH: 31 pg (ref 26.0–34.0)
MCHC: 32.5 g/dL (ref 30.0–36.0)
MCV: 95.3 fL (ref 80.0–100.0)
Platelets: 199 10*3/uL (ref 150–400)
RBC: 3.39 MIL/uL — ABNORMAL LOW (ref 4.22–5.81)
RDW: 15.1 % (ref 11.5–15.5)
WBC: 13.3 10*3/uL — ABNORMAL HIGH (ref 4.0–10.5)
nRBC: 0.2 % (ref 0.0–0.2)

## 2020-07-06 LAB — TYPE AND SCREEN
ABO/RH(D): O POS
Antibody Screen: NEGATIVE
Unit division: 0
Unit division: 0

## 2020-07-06 LAB — BPAM RBC
Blood Product Expiration Date: 202205132359
Blood Product Expiration Date: 202206032359
Unit Type and Rh: 5100
Unit Type and Rh: 5100

## 2020-07-06 LAB — COOXEMETRY PANEL
Carboxyhemoglobin: 0.6 % (ref 0.5–1.5)
Methemoglobin: 1.2 % (ref 0.0–1.5)
O2 Saturation: 49.8 %
Total hemoglobin: 11 g/dL — ABNORMAL LOW (ref 12.0–16.0)

## 2020-07-06 LAB — GLUCOSE, CAPILLARY
Glucose-Capillary: 89 mg/dL (ref 70–99)
Glucose-Capillary: 85 mg/dL (ref 70–99)
Glucose-Capillary: 92 mg/dL (ref 70–99)
Glucose-Capillary: 94 mg/dL (ref 70–99)
Glucose-Capillary: 85 mg/dL (ref 70–99)
Glucose-Capillary: 93 mg/dL (ref 70–99)
Glucose-Capillary: 104 mg/dL — ABNORMAL HIGH (ref 70–99)

## 2020-07-06 IMAGING — DX DG CHEST 1V PORT
1 series · 1 of 1 positions shown · non-contrast
Comparison: Chest radiograph from one day prior.

CLINICAL DATA: Pleural effusion, NSTEMI

EXAM:
PORTABLE CHEST 1 VIEW

[chest]
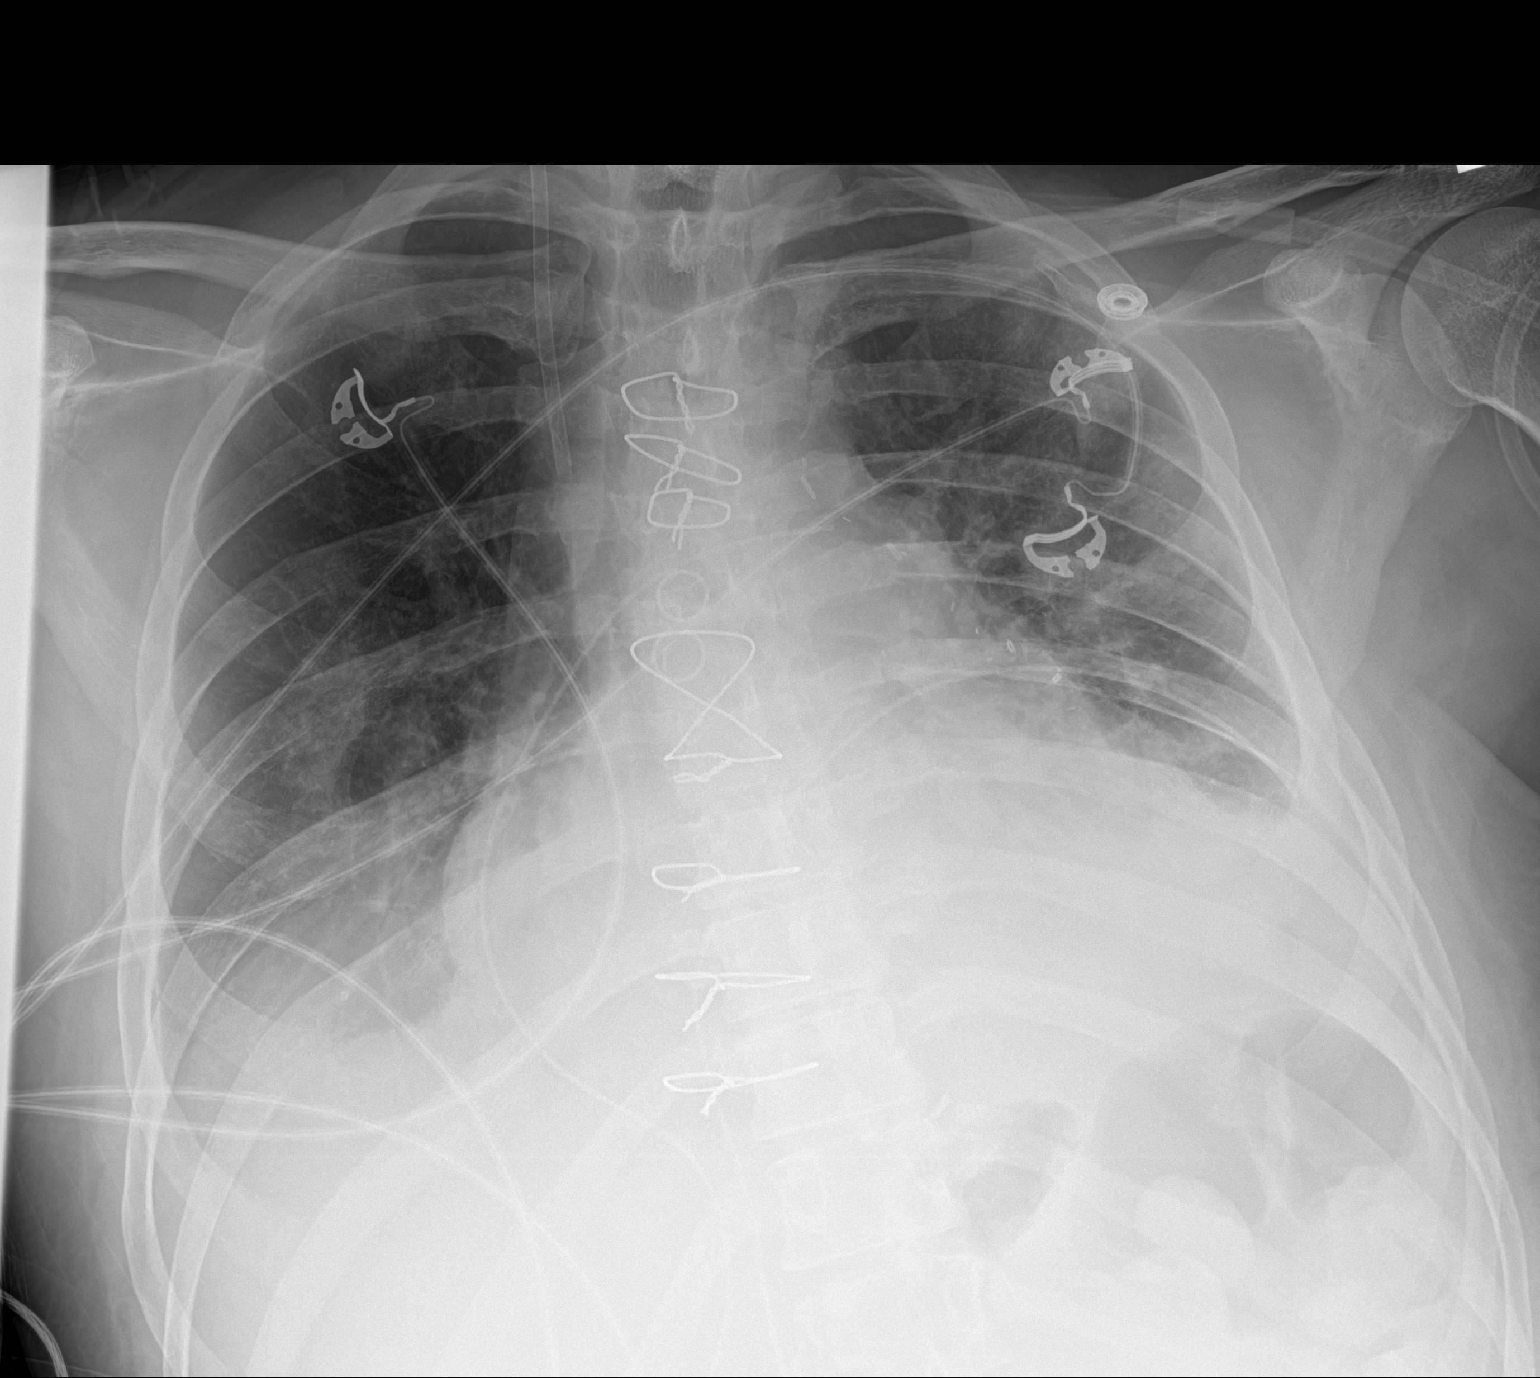

[1 of 1 positions shown; findings below may reference images not displayed]

FINDINGS: Stable right internal jugular central venous sheath terminating in
the upper third of the SVC. Intact sternotomy wires. Stable
mediastinal drains. CABG clips overlie the left mediastinum. Stable
cardiomediastinal silhouette with mild cardiomegaly. No
pneumothorax. Small bilateral pleural effusions are stable. No overt
pulmonary edema. Bibasilar atelectasis, minimally improved.
IMPRESSION: 1. No pneumothorax.
2. Stable small bilateral pleural effusions.
3. Bibasilar atelectasis, minimally improved.
4. Stable cardiomegaly without overt pulmonary edema.

## 2020-07-06 NOTE — Plan of Care (Signed)
Pt updated on care plan and general knowledge regarding disease process  Problem: Education: Goal: Knowledge of General Education information will improve Description: Including pain rating scale, medication(s)/side effects and non-pharmacologic comfort measures Outcome: Progressing

## 2020-07-06 NOTE — Progress Notes (Addendum)
      [address] [address]       [place] [address]             [phone]        CARDIOTHORACIC SURGERY PROGRESS NOTE   R3 Days Post-Op Procedure(s) (LRB): CORONARY ARTERY BYPASS GRAFTING (CABG) times four using left internal mammary artery, right arm radial artery and right leg saphenous vein (N/A) RADIAL ARTERY HARVEST (Right) TRANSESOPHAGEAL ECHOCARDIOGRAM (TEE) (N/A)  Subjective: Feels a little better.  Denies SOB.  Expected soreness in chest  Objective: Vital signs: BP Readings from Last 1 Encounters:  07/06/20 112/72   Pulse Readings from Last 1 Encounters:  07/06/20 (!) 110   Resp Readings from Last 1 Encounters:  07/06/20 18   Temp Readings from Last 1 Encounters:  07/06/20 97.9 F (36.6 C)    Hemodynamics:    Physical Exam:  Rhythm:   Sinus tach  Breath sounds: clear  Heart sounds:  RRR  Incisions:  Clean and dry  Abdomen:  Soft, non-distended, non-tender  Extremities:  Warm, well-perfused  Chest tubes:  low volume thin serosanguinous output, no air leak    Intake/Output from previous day: 05/06 0701 - 05/07 0700 In: 555 [I.V.:555] Out: 1895 [Urine:1535; Chest Tube:360] Intake/Output this shift: Total I/O In: -  Out: 70 [Urine:50; Chest Tube:20]  Lab Results:  CBC: Recent Labs    07/05/20 0307 07/06/20 0446  WBC 11.8* 13.3*  HGB 9.7* 10.5*  HCT 30.3* 32.3*  PLT 146* 199    BMET:  Recent Labs    07/05/20 0307 07/06/20 0446  NA 134* 135  K 4.3 3.8  CL 105 102  CO2 24 26  GLUCOSE 109* 82  BUN 6 9  CREATININE 0.73 0.73  CALCIUM 7.7* 7.8*     PT/INR:   Recent Labs    07/03/20 1532  LABPROT 17.9*  INR 1.5*    CBG (last 3)  Recent Labs    07/06/20 0119 07/06/20 0417 07/06/20 0646  GLUCAP 89 85 92    ABG    Component Value Date/Time   PHART 7.385 07/04/2020 0258   PCO2ART 34.6 07/04/2020 0258   PO2ART 91 07/04/2020 0258   HCO3 20.5 07/04/2020 0258   TCO2 22 07/04/2020 0258   ACIDBASEDEF 4.0 (H)  07/04/2020 0258   O2SAT 97.0 07/04/2020 0258    CXR: PORTABLE CHEST 1 VIEW  COMPARISON:  Chest radiograph from one day prior.  FINDINGS: Stable right internal jugular central venous sheath terminating in the upper third of the SVC. Intact sternotomy wires. Stable mediastinal drains. CABG clips overlie the left mediastinum. Stable cardiomediastinal silhouette with mild cardiomegaly. No pneumothorax. Small bilateral pleural effusions are stable. No overt pulmonary edema. Bibasilar atelectasis, minimally improved.  IMPRESSION: 1. No pneumothorax. 2. Stable small bilateral pleural effusions. 3. Bibasilar atelectasis, minimally improved. 4. Stable cardiomegaly without overt pulmonary edema.   Electronically Signed   By: Ilona Sorrel M.D.   On: 07/06/2020 08:53   Assessment/Plan: S/P Procedure(s) (LRB): CORONARY ARTERY BYPASS GRAFTING (CABG) times four using left internal mammary artery, right arm radial artery and right leg saphenous vein (N/A) RADIAL ARTERY HARVEST (Right) TRANSESOPHAGEAL ECHOCARDIOGRAM (TEE) (N/A)  Making slow but steady progress Mobilize Diuresis Check co-ox and wean dobutamine slowly D/C tubes  Rexene Alberts, MD 07/06/2020 9:32 AM

## 2020-07-07 ENCOUNTER — Inpatient Hospital Stay (HOSPITAL_COMMUNITY): Payer: Medicaid Other

## 2020-07-07 LAB — BASIC METABOLIC PANEL
Anion gap: 9 (ref 5–15)
BUN: 9 mg/dL (ref 6–20)
CO2: 25 mmol/L (ref 22–32)
Calcium: 7.7 mg/dL — ABNORMAL LOW (ref 8.9–10.3)
Chloride: 101 mmol/L (ref 98–111)
Creatinine, Ser: 0.76 mg/dL (ref 0.61–1.24)
GFR, Estimated: >60 mL/min (ref >60)
Glucose, Bld: 107 mg/dL — ABNORMAL HIGH (ref 70–99)
Potassium: 3.8 mmol/L (ref 3.5–5.1)
Sodium: 135 mmol/L (ref 135–145)

## 2020-07-07 LAB — GLUCOSE, CAPILLARY
Glucose-Capillary: 80 mg/dL (ref 70–99)
Glucose-Capillary: 83 mg/dL (ref 70–99)
Glucose-Capillary: 94 mg/dL (ref 70–99)
Glucose-Capillary: 74 mg/dL (ref 70–99)
Glucose-Capillary: 91 mg/dL (ref 70–99)

## 2020-07-07 LAB — CBC
HCT: 31.4 % — ABNORMAL LOW (ref 39.0–52.0)
Hemoglobin: 10.1 g/dL — ABNORMAL LOW (ref 13.0–17.0)
MCH: 31.1 pg (ref 26.0–34.0)
MCHC: 32.2 g/dL (ref 30.0–36.0)
MCV: 96.6 fL (ref 80.0–100.0)
Platelets: 240 10*3/uL (ref 150–400)
RBC: 3.25 MIL/uL — ABNORMAL LOW (ref 4.22–5.81)
RDW: 15.4 % (ref 11.5–15.5)
WBC: 10.5 10*3/uL (ref 4.0–10.5)
nRBC: 0.5 % — ABNORMAL HIGH (ref 0.0–0.2)

## 2020-07-07 LAB — COOXEMETRY PANEL
Carboxyhemoglobin: 0.7 % (ref 0.5–1.5)
Methemoglobin: 1.4 % (ref 0.0–1.5)
O2 Saturation: 55.3 %
Total hemoglobin: 9.5 g/dL — ABNORMAL LOW (ref 12.0–16.0)

## 2020-07-07 IMAGING — DX DG CHEST 1V PORT
1 series · 1 of 1 positions shown · non-contrast
Comparison: Chest radiograph from one day prior.

CLINICAL DATA: CHF

EXAM:
PORTABLE CHEST 1 VIEW

[chest ap]
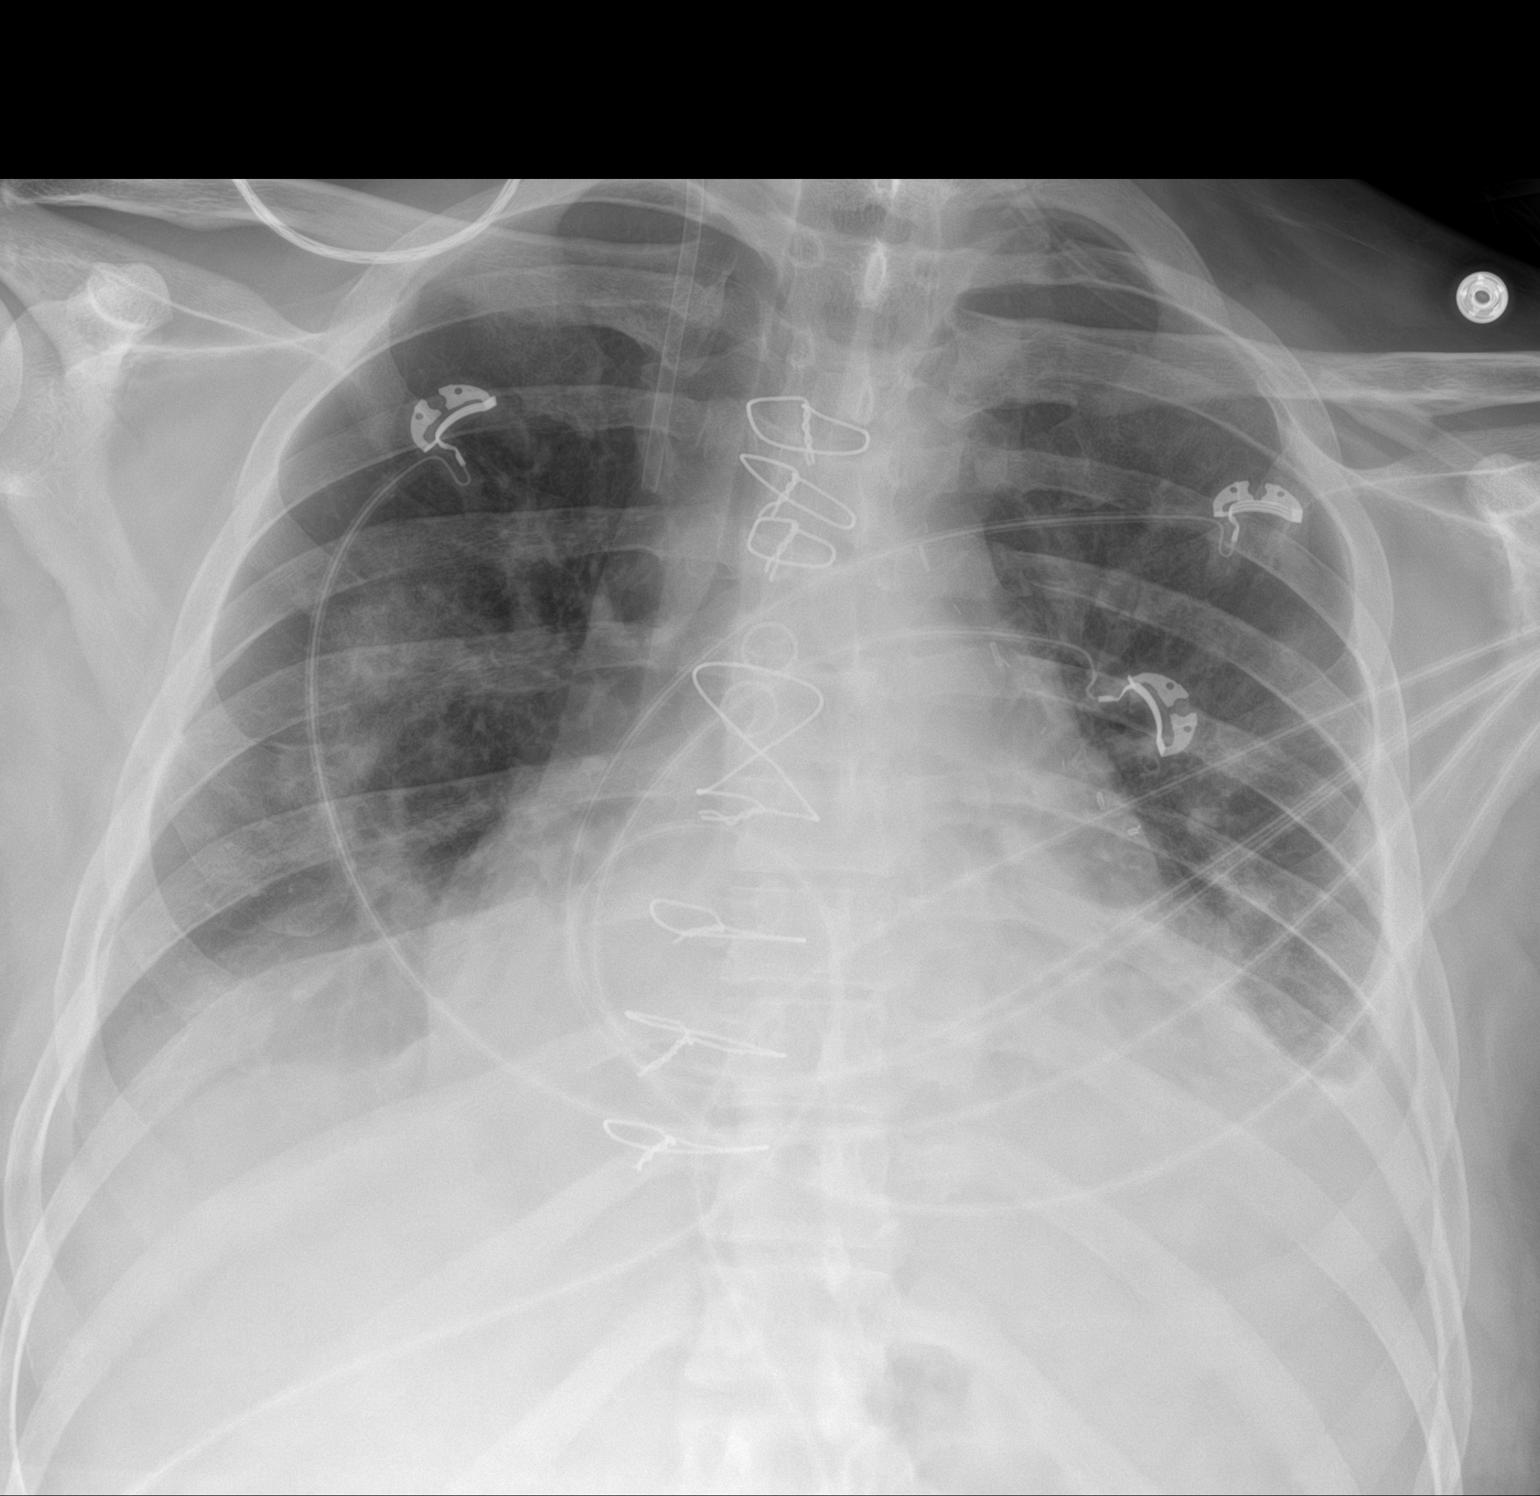

[1 of 1 positions shown; findings below may reference images not displayed]

FINDINGS: Stable right internal jugular sheath. Intact sternotomy wires.
Stable cardiomediastinal silhouette with mild cardiomegaly. No
pneumothorax. Small bilateral pleural effusions, similar. Patchy
parahilar interstitial and hazy opacities are slightly increased
particularly in the right mid lung. Overall slightly improved lung
volumes with persistent moderate bibasilar atelectasis.
IMPRESSION: 1. Cardiomegaly. Slightly increased patchy parahilar interstitial
and hazy opacities, particularly in the right mid lung, compatible
with slightly worsened CHF.
2. Slightly improved lung volumes with persistent moderate bibasilar
atelectasis.
3. Stable small bilateral pleural effusions.

## 2020-07-07 MED ORDER — POTASSIUM CHLORIDE CRYS ER 20 MEQ PO TBCR
40.0000 meq | EXTENDED_RELEASE_TABLET | Freq: Two times a day (BID) | ORAL | Status: DC
  Administered 2020-07-07 (×2): 40 meq via ORAL
  Filled 2020-07-07 (×3): qty 2

## 2020-07-07 MED ORDER — SPIRONOLACTONE 25 MG PO TABS
25.0000 mg | ORAL_TABLET | Freq: Every day | ORAL | Status: DC
  Administered 2020-07-07 – 2020-07-10 (×4): 25 mg via ORAL
  Filled 2020-07-07 (×4): qty 1

## 2020-07-07 MED ORDER — FUROSEMIDE 10 MG/ML IJ SOLN
40.0000 mg | Freq: Two times a day (BID) | INTRAMUSCULAR | Status: DC
  Administered 2020-07-07 (×2): 40 mg via INTRAVENOUS
  Filled 2020-07-07 (×2): qty 4

## 2020-07-07 NOTE — Progress Notes (Signed)
[address]       [place] [address]             [phone]        CARDIOTHORACIC SURGERY PROGRESS NOTE   R4 Days Post-Op Procedure(s) (LRB): CORONARY ARTERY BYPASS GRAFTING (CABG) times four using left internal mammary artery, right arm radial artery and right leg saphenous vein (N/A) RADIAL ARTERY HARVEST (Right) TRANSESOPHAGEAL ECHOCARDIOGRAM (TEE) (N/A)  Subjective: No complaints.  Feeling a little better every day.  Mild soreness in chest.  Ambulating fairly well although gets SOB with activity.  Nursing reports unable to wean dobutamine off using recommended parameters   Objective: Vital signs: BP Readings from Last 1 Encounters:  07/07/20 104/71   Pulse Readings from Last 1 Encounters:  07/07/20 (!) 109   Resp Readings from Last 1 Encounters:  07/07/20 15   Temp Readings from Last 1 Encounters:  07/07/20 98 F (36.7 C)    Hemodynamics:   Mixed venous co-ox 55% (increased from 50% yesterday)   Physical Exam:  Rhythm:   Sinus tach  Breath sounds: Few insp crackles  Heart sounds:  RRR w/out murmur  Incisions:  Clean and dry  Abdomen:  Soft, non-distended, non-tender  Extremities:  Warm, well-perfused    Intake/Output from previous day: 05/07 0701 - 05/08 0700 In: 277.8 [I.V.:277.8] Out: 776 [Urine:736; Chest Tube:40] Intake/Output this shift: No intake/output data recorded.  Lab Results:  CBC: Recent Labs    07/06/20 0446 07/07/20 0431  WBC 13.3* 10.5  HGB 10.5* 10.1*  HCT 32.3* 31.4*  PLT 199 240    BMET:  Recent Labs    07/06/20 0446 07/07/20 0431  NA 135 135  K 3.8 3.8  CL 102 101  CO2 26 25  GLUCOSE 82 107*  BUN 9 9  CREATININE 0.73 0.76  CALCIUM 7.8* 7.7*     PT/INR:  No results for input(s): LABPROT, INR in the last 72 hours.  CBG (last 3)  Recent Labs    07/06/20 2323 07/07/20 0345 07/07/20 0739  GLUCAP 104* 80 83    ABG    Component Value Date/Time   PHART 7.385 07/04/2020 0258    PCO2ART 34.6 07/04/2020 0258   PO2ART 91 07/04/2020 0258   HCO3 20.5 07/04/2020 0258   TCO2 22 07/04/2020 0258   ACIDBASEDEF 4.0 (H) 07/04/2020 0258   O2SAT 55.3 07/07/2020 0431    CXR: PORTABLE CHEST 1 VIEW  COMPARISON:  Chest radiograph from one day prior.  FINDINGS: Stable right internal jugular sheath. Intact sternotomy wires. Stable cardiomediastinal silhouette with mild cardiomegaly. No pneumothorax. Small bilateral pleural effusions, similar. Patchy parahilar interstitial and hazy opacities are slightly increased particularly in the right mid lung. Overall slightly improved lung volumes with persistent moderate bibasilar atelectasis.  IMPRESSION: 1. Cardiomegaly. Slightly increased patchy parahilar interstitial and hazy opacities, particularly in the right mid lung, compatible with slightly worsened CHF. 2. Slightly improved lung volumes with persistent moderate bibasilar atelectasis. 3. Stable small bilateral pleural effusions.   Electronically Signed   By: Ilona Sorrel M.D.   On: 07/07/2020 08:15  Assessment/Plan: S/P Procedure(s) (LRB): CORONARY ARTERY BYPASS GRAFTING (CABG) times four using left internal mammary artery, right arm radial artery and right leg saphenous vein (N/A) RADIAL ARTERY HARVEST (Right) TRANSESOPHAGEAL ECHOCARDIOGRAM (TEE) (N/A)  Overall stable POD4 Maintaining NSR w/ stable BP w/ co-ox 55% although still requiring dobutamine Breathing comfortably w/ O2 sats  94% on 3 L/min, CXR looks okay Acute systolic  CHF with expected post-op volume excess, weight still 5 kg > preop Expected post op acute blood loss anemia, mild, stable Expected post op atelectasis, mild   Continue to try to wean dobutamine as tolerated  Add spironolactone and continue lasix  Consider checking f/u ECHO and asking advanced heart failure team to assist if he doesn't improve over next 24 hours  Mobilize   Rexene Alberts, MD 07/07/2020 9:20 AM

## 2020-07-07 NOTE — Progress Notes (Signed)
TCTS BRIEF SICU PROGRESS NOTE  4 Days Post-Op  S/P Procedure(s) (LRB): CORONARY ARTERY BYPASS GRAFTING (CABG) times four using left internal mammary artery, right arm radial artery and right leg saphenous vein (N/A) RADIAL ARTERY HARVEST (Right) TRANSESOPHAGEAL ECHOCARDIOGRAM (TEE) (N/A)   Stable day Tolerating dobutamine wean Diuresing better  Plan: Continue current plan  Rexene Alberts, MD 07/07/2020 6:48 PM

## 2020-07-08 LAB — COMPREHENSIVE METABOLIC PANEL
ALT: 17 U/L (ref 0–44)
AST: 29 U/L (ref 15–41)
Albumin: 2.3 g/dL — ABNORMAL LOW (ref 3.5–5.0)
Alkaline Phosphatase: 103 U/L (ref 38–126)
Anion gap: 10 (ref 5–15)
BUN: 10 mg/dL (ref 6–20)
CO2: 23 mmol/L (ref 22–32)
Calcium: 7.9 mg/dL — ABNORMAL LOW (ref 8.9–10.3)
Chloride: 103 mmol/L (ref 98–111)
Creatinine, Ser: 0.94 mg/dL (ref 0.61–1.24)
GFR, Estimated: >60 mL/min (ref >60)
Glucose, Bld: 85 mg/dL (ref 70–99)
Potassium: 4.4 mmol/L (ref 3.5–5.1)
Sodium: 136 mmol/L (ref 135–145)
Total Bilirubin: 0.8 mg/dL (ref 0.3–1.2)
Total Protein: 5.8 g/dL — ABNORMAL LOW (ref 6.5–8.1)

## 2020-07-08 LAB — CBC
HCT: 29.8 % — ABNORMAL LOW (ref 39.0–52.0)
Hemoglobin: 9.5 g/dL — ABNORMAL LOW (ref 13.0–17.0)
MCH: 30.7 pg (ref 26.0–34.0)
MCHC: 31.9 g/dL (ref 30.0–36.0)
MCV: 96.4 fL (ref 80.0–100.0)
Platelets: 359 10*3/uL (ref 150–400)
RBC: 3.09 MIL/uL — ABNORMAL LOW (ref 4.22–5.81)
RDW: 15.5 % (ref 11.5–15.5)
WBC: 10.5 10*3/uL (ref 4.0–10.5)
nRBC: 1 % — ABNORMAL HIGH (ref 0.0–0.2)

## 2020-07-08 LAB — COOXEMETRY PANEL
Carboxyhemoglobin: 0.7 % (ref 0.5–1.5)
Methemoglobin: 1.3 % (ref 0.0–1.5)
O2 Saturation: 69.5 %
Total hemoglobin: 13.5 g/dL (ref 12.0–16.0)

## 2020-07-08 LAB — BRAIN NATRIURETIC PEPTIDE: B Natriuretic Peptide: 1344 pg/mL — ABNORMAL HIGH (ref 0.0–100.0)

## 2020-07-08 MED ORDER — POTASSIUM CHLORIDE CRYS ER 20 MEQ PO TBCR
40.0000 meq | EXTENDED_RELEASE_TABLET | Freq: Every day | ORAL | Status: DC
  Administered 2020-07-08 – 2020-07-10 (×3): 40 meq via ORAL
  Filled 2020-07-08 (×3): qty 2

## 2020-07-08 MED ORDER — SODIUM CHLORIDE 0.9% FLUSH
3.0000 mL | Freq: Two times a day (BID) | INTRAVENOUS | Status: DC
  Administered 2020-07-08 – 2020-07-10 (×5): 3 mL via INTRAVENOUS

## 2020-07-08 MED ORDER — SODIUM CHLORIDE 0.9 % IV SOLN: 250.0000 mL | INTRAVENOUS | Status: DC | PRN

## 2020-07-08 MED ORDER — CARVEDILOL 3.125 MG PO TABS
3.1250 mg | ORAL_TABLET | Freq: Two times a day (BID) | ORAL | Status: DC
  Administered 2020-07-08 (×2): 3.125 mg via ORAL
  Filled 2020-07-08 (×2): qty 1

## 2020-07-08 MED ORDER — SODIUM CHLORIDE 0.9% FLUSH: 3.0000 mL | INTRAVENOUS | Status: DC | PRN

## 2020-07-08 MED ORDER — ~~LOC~~ CARDIAC SURGERY, PATIENT & FAMILY EDUCATION: Freq: Once | Status: AC

## 2020-07-08 MED ORDER — FUROSEMIDE 40 MG PO TABS
40.0000 mg | ORAL_TABLET | Freq: Every day | ORAL | Status: DC
  Administered 2020-07-08 – 2020-07-10 (×3): 40 mg via ORAL
  Filled 2020-07-08 (×3): qty 1

## 2020-07-08 MED FILL — Sodium Chloride IV Soln 0.9%: INTRAVENOUS | Qty: 2000 | Status: AC

## 2020-07-08 MED FILL — Lidocaine HCl Local Preservative Free (PF) Inj 2%: INTRAMUSCULAR | Qty: 15 | Status: AC

## 2020-07-08 MED FILL — Potassium Chloride Inj 2 mEq/ML: INTRAVENOUS | Qty: 40 | Status: AC

## 2020-07-08 MED FILL — Heparin Sodium (Porcine) Inj 1000 Unit/ML: INTRAMUSCULAR | Qty: 40 | Status: AC

## 2020-07-08 MED FILL — Electrolyte-R (PH 7.4) Solution: INTRAVENOUS | Qty: 4000 | Status: AC

## 2020-07-08 MED FILL — Calcium Chloride Inj 10%: INTRAVENOUS | Qty: 10 | Status: AC

## 2020-07-08 MED FILL — Mannitol IV Soln 20%: INTRAVENOUS | Qty: 1000 | Status: AC

## 2020-07-08 MED FILL — Lidocaine HCl Local Preservative Free (PF) Inj 1%: INTRAMUSCULAR | Qty: 6 | Status: AC

## 2020-07-08 MED FILL — Heparin Sodium (Porcine) Inj 1000 Unit/ML: INTRAMUSCULAR | Qty: 30 | Status: AC

## 2020-07-08 NOTE — TOC Initial Note (Addendum)
Transition of Care (TOC) - Initial/Assessment Note  Heart Failure   Patient Details  Name: Wyatt Donovan MRN: [id number] Date of Birth: February 13, 1986  Transition of Care [facility]) CM/SW Contact:    [place], [place] Phone Number: 07/08/2020, 2:43 PM  Clinical Narrative:                 CSW spoke with the patient at bedside and brought patient a CAFA application and obtained his signature for [employer] rider waiver to enroll him in [employer]. CSW also gave Mr. Fana a list of local primary care doctors in the surrounding area as he reported to be looking for a new PCP. CSW provided the patient with social workers name, number and position and to reach out to [place] as other social needs arise.  CSW sent over the [facility] new enrollment form and provided the patient with [employer] transportation's number for future transportation to [facility] related appointments.      Barriers to Discharge: Continued Medical Work up   Patient Goals and CMS Choice        Expected Discharge Plan and Services   In-house Referral: Clinical Social Work                                            Prior Living Arrangements/Services     Patient language and need for interpreter reviewed:: Yes        Need for Family Participation in Patient Care: No (Comment) Care giver support system in place?: No (comment)   Criminal Activity/Legal Involvement Pertinent to Current Situation/Hospitalization: No - Comment as needed  Activities of Daily Living Home Assistive Devices/Equipment: None ADL Screening (condition at time of admission) Patient's cognitive ability adequate to safely complete daily activities?: Yes Is the patient deaf or have difficulty hearing?: No Does the patient have difficulty seeing, even when wearing glasses/contacts?: Yes Does the patient have difficulty concentrating, remembering, or making decisions?: No Patient able to express need for assistance  with ADLs?: Yes Does the patient have difficulty dressing or bathing?: No Independently performs ADLs?: Yes (appropriate for developmental age) Does the patient have difficulty walking or climbing stairs?: No Weakness of Legs: None Weakness of Arms/Hands: None  Permission Sought/Granted                  Emotional Assessment Appearance:: Appears stated age Attitude/Demeanor/Rapport: Engaged Affect (typically observed): Pleasant Orientation: : Oriented to Self,Oriented to Place,Oriented to  Time,Oriented to Situation   Psych Involvement: No (comment)  Admission diagnosis:  Acute chest pain [R07.9] Chest pain at rest [R07.9] History of coronary artery disease [Z86.79] Coronary artery disease [I25.10] Patient Active Problem List   Diagnosis Date Noted  . 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 Coronary artery disease 07/03/2020  . Non-ST elevation (NSTEMI) myocardial infarction ([place])   . Chest pain at rest 06/29/2020   PCP:  Neale Burly, MD Pharmacy:   [facility], [address] [place] [address] Phone: [phone] Fax: [place] [address] [place] [address] Phone: [phone] Fax: [phone]     Social Determinants of Health (SDOH) Interventions Food Insecurity Interventions: Other (Comment) (has food stamps) Financial Strain Interventions: Development worker, community (already seen pt.) Housing Interventions: Intervention Not Indicated Transportation Interventions: Intervention Not Indicated  Readmission Risk Interventions  No flowsheet data found.  Domonic Kimball, MSW, [place] Heart Failure Social Worker

## 2020-07-08 NOTE — Progress Notes (Signed)
      [address]       [place] [address]             [phone]                 5 Days Post-Op Procedure(s) (LRB): CORONARY ARTERY BYPASS GRAFTING (CABG) times four using left internal mammary artery, right arm radial artery and right leg saphenous vein (N/A) RADIAL ARTERY HARVEST (Right) TRANSESOPHAGEAL ECHOCARDIOGRAM (TEE) (N/A)   Events: No events renal dobutamine without event.  _______________________________________________________________ Vitals: BP 116/76 (BP Location: Left Arm)   Pulse (!) 114   Temp 97.6 F (36.4 C) (Oral)   Resp 20   Ht 6\' 1"  (1.854 m)   Wt 80.8 kg   SpO2 92%   BMI 23.50 kg/m   - Neuro: alert NAD  - Cardiovascular: sinus tachycardia  Drips: None    - Pulm: EWOB    ABG    Component Value Date/Time   PHART 7.385 07/04/2020 0258   PCO2ART 34.6 07/04/2020 0258   PO2ART 91 07/04/2020 0258   HCO3 20.5 07/04/2020 0258   TCO2 22 07/04/2020 0258   ACIDBASEDEF 4.0 (H) 07/04/2020 0258   O2SAT 69.5 07/08/2020 0338    - Abd: ND - Extremity: warm  .Intake/Output      05/08 0701 05/09 0700 05/09 0701 05/10 0700   P.O. 600    I.V. (mL/kg) 78.4 (1) 12.5 (0.2)   Total Intake(mL/kg) 678.4 (8.4) 12.5 (0.2)   Urine (mL/kg/hr) 2490 (1.3)    Stool 0    Chest Tube     Total Output 2490    Net -1811.6 +12.5        Stool Occurrence 1 x       _______________________________________________________________ Labs: CBC Latest Ref Rng & Units 07/08/2020 07/07/2020 07/06/2020  WBC 4.0 - 10.5 K/uL 10.5 10.5 13.3(H)  Hemoglobin 13.0 - 17.0 g/dL 9.5(L) 10.1(L) 10.5(L)  Hematocrit 39.0 - 52.0 % 29.8(L) 31.4(L) 32.3(L)  Platelets 150 - 400 K/uL 359 240 199   CMP Latest Ref Rng & Units 07/08/2020 07/07/2020 07/06/2020  Glucose 70 - 99 mg/dL 85 107(H) 82  BUN 6 - 20 mg/dL 10 9 9   Creatinine 0.61 - 1.24 mg/dL 0.94 0.76 0.73  Sodium 135 - 145 mmol/L 136 135 135  Potassium 3.5 - 5.1 mmol/L 4.4 3.8 3.8  Chloride 98 - 111 mmol/L 103 101 102   CO2 22 - 32 mmol/L 23 25 26   Calcium 8.9 - 10.3 mg/dL 7.9(L) 7.7(L) 7.8(L)  Total Protein 6.5 - 8.1 g/dL 5.8(L) - -  Total Bilirubin 0.3 - 1.2 mg/dL 0.8 - -  Alkaline Phos 38 - 126 U/L 103 - -  AST 15 - 41 U/L 29 - -  ALT 0 - 44 U/L 17 - -    CXR: PV congestion, B effusions  _______________________________________________________________  Assessment and Plan: POD 5 s/p CABG  Neuro: pain controlled.  on robaxin, and lidocaine patches CV: on Norvasc for radial harvest.  Off dobutamine, will start low-dose Coreg Pulm: pulm toilet.   Renal: creat stable.  Will diurese today GI: on diet Heme: stable ID: afebrile Endo: SSI Dispo: Transfer to floor.   Wyatt Donovan 07/08/2020 11:25 AM

## 2020-07-08 NOTE — Progress Notes (Signed)
Pt transferred to 4E #13 via wheelchair with RN. VSS, on RA. After ambulation back to bed, pt satting 86% on RA with self-resolution to 96%. BP WDL, 4E RN present for admit.   Dione Plover., RN

## 2020-07-08 NOTE — Progress Notes (Signed)
Mobility Specialist - Progress Note   07/08/20 1757  Mobility  Activity Ambulated in hall  Level of Assistance Standby assist, set-up cues, supervision of patient - no hands on  Assistive Device Front wheel walker  Distance Ambulated (ft) 450 ft  Mobility Response Tolerated well  Mobility performed by Mobility specialist  $Mobility charge 1 Mobility   Pre-mobility: 111 HR During mobility: 123 HR Post-mobility: 104 HR  Pt took brief standing rest breaks approximately every 50 ft. His SpO2 remained >90% throughout. Pt to chair in front of sink after walk so he can be bathed, NT notified.  Pricilla Handler Mobility Specialist Mobility Specialist Phone: [phone]

## 2020-07-08 NOTE — Progress Notes (Signed)
CARDIAC REHAB PHASE I   PRE:  Rate/Rhythm: 100 ST    BP: sitting 107/92    SaO2: 93 RA  MODE:  Ambulation: 390 ft   POST:  Rate/Rhythm: 126 ST    BP: sitting 111/73     SaO2: 95 RA  Pt out of bed following sternal precaution. Ambulated with RW with standby assist. Pt major c/o is fatigue. Diaphoretic with distance and required several short standing rests. HR up to 126 ST. Exhausted by end of walk, to recliner. VSS. Encouraged IS and another walk today. Dad present and supportive. [place], ACSM 07/08/2020 2:06 PM

## 2020-07-09 DIAGNOSIS — K921 Melena: Secondary | ICD-10-CM

## 2020-07-09 DIAGNOSIS — Z7901 Long term (current) use of anticoagulants: Secondary | ICD-10-CM

## 2020-07-09 LAB — BASIC METABOLIC PANEL
Anion gap: 8 (ref 5–15)
BUN: 13 mg/dL (ref 6–20)
CO2: 25 mmol/L (ref 22–32)
Calcium: 7.9 mg/dL — ABNORMAL LOW (ref 8.9–10.3)
Chloride: 104 mmol/L (ref 98–111)
Creatinine, Ser: 0.89 mg/dL (ref 0.61–1.24)
GFR, Estimated: >60 mL/min (ref >60)
Glucose, Bld: 92 mg/dL (ref 70–99)
Potassium: 4.4 mmol/L (ref 3.5–5.1)
Sodium: 137 mmol/L (ref 135–145)

## 2020-07-09 LAB — CBC
HCT: 31.2 % — ABNORMAL LOW (ref 39.0–52.0)
Hemoglobin: 9.7 g/dL — ABNORMAL LOW (ref 13.0–17.0)
MCH: 30.5 pg (ref 26.0–34.0)
MCHC: 31.1 g/dL (ref 30.0–36.0)
MCV: 98.1 fL (ref 80.0–100.0)
Platelets: 417 10*3/uL — ABNORMAL HIGH (ref 150–400)
RBC: 3.18 MIL/uL — ABNORMAL LOW (ref 4.22–5.81)
RDW: 15.7 % — ABNORMAL HIGH (ref 11.5–15.5)
WBC: 9.7 10*3/uL (ref 4.0–10.5)
nRBC: 0.6 % — ABNORMAL HIGH (ref 0.0–0.2)

## 2020-07-09 MED ORDER — CARVEDILOL 6.25 MG PO TABS
6.2500 mg | ORAL_TABLET | Freq: Two times a day (BID) | ORAL | Status: DC
  Administered 2020-07-09 – 2020-07-10 (×3): 6.25 mg via ORAL
  Filled 2020-07-09 (×3): qty 1

## 2020-07-09 MED ORDER — PHENYLEPHRINE IN HARD FAT 0.25 % RE SUPP
1.0000 | Freq: Every day | RECTAL | Status: DC | PRN
  Filled 2020-07-09: qty 1

## 2020-07-09 NOTE — Progress Notes (Addendum)
      [address]       [place] [address]             [phone]      6 Days Post-Op Procedure(s) (LRB): CORONARY ARTERY BYPASS GRAFTING (CABG) times four using left internal mammary artery, right arm radial artery and right leg saphenous vein (N/A) RADIAL ARTERY HARVEST (Right) TRANSESOPHAGEAL ECHOCARDIOGRAM (TEE) (N/A) Subjective: Says he is feeling better today.  Walked in the hall x 4 yesterday.   Tolerating PO's. Having BM's. O2 at 2L [place] but says he is breathing better.  Objective: Vital signs in last 24 hours: Temp:  [97.6 F (36.4 C)-98.5 F (36.9 C)] 98.5 F (36.9 C) (05/10 0754) Pulse Rate:  [92-114] 100 (05/10 0754) Cardiac Rhythm: Sinus tachycardia (05/10 0737) Resp:  [15-27] 17 (05/10 0754) BP: (95-116)/(68-76) 113/71 (05/10 0754) SpO2:  [92 %-98 %] 94 % (05/10 0754) Weight:  [80.8 kg] 80.8 kg (05/10 0402)     Intake/Output from previous day: 05/09 0701 - 05/10 0700 In: 452.5 [P.O.:440; I.V.:12.5] Out: 1450 [Urine:1450] Intake/Output this shift: No intake/output data recorded.  General appearance: alert, cooperative and no distress Neurologic: intact Heart: NSR / borderline sinus tach. No significant arrhythmias. Lungs: Breath sounds CTA.  Abdomen: soft and non-tender Extremities: no edema. The RUE and RLE incisions are intact and dry.  Wound: the sternotomy has some dried bloody drainage, otherwise intact.  Lab Results: Recent Labs    07/08/20 0338 07/09/20 0127  WBC 10.5 9.7  HGB 9.5* 9.7*  HCT 29.8* 31.2*  PLT 359 417*   BMET:  Recent Labs    07/08/20 0338 07/09/20 0127  NA 136 137  K 4.4 4.4  CL 103 104  CO2 23 25  GLUCOSE 85 92  BUN 10 13  CREATININE 0.94 0.89  CALCIUM 7.9* 7.9*    PT/INR: No results for input(s): LABPROT, INR in the last 72 hours. ABG    Component Value Date/Time   PHART 7.385 07/04/2020 0258   HCO3 20.5 07/04/2020 0258   TCO2 22 07/04/2020 0258   ACIDBASEDEF 4.0 (H) 07/04/2020 0258    O2SAT 69.5 07/08/2020 0338   CBG (last 3)  Recent Labs    07/07/20 1124 07/07/20 1526 07/07/20 1946  GLUCAP 94 74 91    Assessment/Plan: S/P Procedure(s) (LRB): CORONARY ARTERY BYPASS GRAFTING (CABG) times four using left internal mammary artery, right arm radial artery and right leg saphenous vein (N/A) RADIAL ARTERY HARVEST (Right) TRANSESOPHAGEAL ECHOCARDIOGRAM (TEE) (N/A)  -POD-6 CABG x 4 after presenting with acute STEMI and CHF. EF 40-45% pre-op.  He is progressing well. Currently on ASA, carvedilol, atorvastatin, Lasix, and spiro.   Continue working on ambulation and O2 wean today.   -Expected acute blood loss anemia- Hct trending up.   -DVT PPX- on daily SQ enoxaparin.   -Disposition- anticipate discharge to home tomorrow if able to wean off the [place] O2 today. He will return to his father's home where he was living prior to admission.    LOS: 10 days    Antony Odea, Hershal Coria [phone] 07/09/2020   Doing well Will increase BB dispo planning  Shanicqua Coldren O Julee Stoll

## 2020-07-09 NOTE — Consult Note (Addendum)
Referring Provider:  [facility]         Primary Care Physician:  Neale Burly, MD Primary Gastroenterologist:  Althia Forts.           We were asked to see this patient for: rectal bleeding                  ASSESSMENT / PLAN:    # 35 yo male with early onset CAD / infarct in 2009. Lost to cardiac follow up due to insurance. He was being evaluated outpatient by Cardiology, recently started on warfarin. Admitted now with acute MI s/p CABG  # Intermittent painless rectal bleeding with BMs since February on warfarin. No blood in stool on DRE. Could be internal hemorrhoids. Doubt fissure in absence of pain. Colon polyps / neoplasm always a possibility.  --We discussed colonoscopy if CTS feels he is safe for sedation / procedure. However, patient says he would like to postpone this for a few weeks until recovered from surgery. Additionally he has already been referred to GI by his PCP but had to cancel this appointment due to recent cardiac events. No [place] of colon cancer in 1st degree relative. His is anemic but probably related to surgery. Hgb was normal earlier this month.  --Patient already has a GI referral from PCP. He has paperwork at home. He can follow up with that GI group or we can see him in the office and arrange for colonoscopy if medically stable at the time. He can contact our office at [phone].  --Ideally he would have the colonoscopy prior to initiation of warfarin (if there are plans to start it based on below)  # LV thrombus. Per CTS note on 5/5 patient may need 3 months of coumadin when further out from surgery.      Attending Physician Note   I have taken a history, examined the patient and reviewed the chart. I agree with the Advanced Practitioner's note, impression and recommendations.  Intermittent, painless, small volume hematochezia with bowel movements for a few months. Suspect internal hemorrhoids. R/O neoplasm, etc. Bleeding could worsen when Coumadin  is restarted for LV thrombus. Offered colonoscopy now although procedure risk is elevated post MI.  He wants to defer colonoscopy to the outpatient setting when he has recovered from his MI and CABG, which is reasonable. Prep H supp qd prn hemorrhoid symptoms. He has a GI referral in progress from his PCP for evaluation of this problem (not [facility]) so he can follow up with that gastroenterologist or with [place]. GI signing off. Please call if needed.   Lucio Edward, MD FACG [phone]      HPI:                                                                                                                             Chief Complaint: rectal bleeding  WESAM GEARHART is a 35 y.o. male tobacco use, CAD s/p  CABG, chronic combined systolic and diastolic heart failure  Patient is a 35 yo male with CAD. He is admitted with an acute MI, s/p CABG. Since February he has had intermittent painless rectal bleeding with BMs. He denies constipation / straining or hemorrhoids. No [place] of colon cancer. Hgb was normal on admission. He has occasional postprandial periumbilical discomfort which is sometimes relieved with defecation. No nausea / vomiting. He reports an adequate appetite and no significant weight loss.    PREVIOUS ENDOSCOPIC EVALUATIONS / PERTINENT STUDIES   None.     Past Medical History:  Diagnosis Date  . CAD (coronary artery disease)   . Dyslipidemia   . Ex-smoker   . History of depression   . Insomnia   . Ischemic cardiomyopathy    Ejection fraction 40-45%  . NSTEMI (non-ST elevated myocardial infarction) ([place]) 08/2007   Treated with a bare metal stent to the proximal LAD  . Suicide attempt ([place]) 01/2001    Past Surgical History:  Procedure Laterality Date  . ADENOIDECTOMY    . CORONARY ARTERY BYPASS GRAFT N/A 07/03/2020   Procedure: CORONARY ARTERY BYPASS GRAFTING (CABG) times four using left internal mammary artery, right arm radial artery and right leg saphenous vein;   Surgeon: Lajuana Matte, MD;  Location: [facility];  Service: Open Heart Surgery;  Laterality: N/A;  . CORONARY STENT PLACEMENT     Bare metal stent to proximal LAD  . LEFT HEART CATH AND CORONARY ANGIOGRAPHY N/A 07/01/2020   Procedure: LEFT HEART CATH AND CORONARY ANGIOGRAPHY;  Surgeon: Lorretta Harp, MD;  Location: [facility];  Service: Cardiovascular;  Laterality: N/A;  . RADIAL ARTERY HARVEST Right 07/03/2020   Procedure: RADIAL ARTERY HARVEST;  Surgeon: Lajuana Matte, MD;  Location: [facility];  Service: Open Heart Surgery;  Laterality: Right;  . TEE WITHOUT CARDIOVERSION N/A 07/03/2020   Procedure: TRANSESOPHAGEAL ECHOCARDIOGRAM (TEE);  Surgeon: Lajuana Matte, MD;  Location: [facility];  Service: Open Heart Surgery;  Laterality: N/A;    Prior to Admission medications   Medication Sig Start Date End Date Taking? Authorizing Provider  atorvastatin (LIPITOR) 40 MG tablet Take 40 mg by mouth daily. 04/04/20  Yes [provider]  metoprolol succinate (TOPROL-XL) 25 MG 24 hr tablet Take 25 mg by mouth daily. 06/10/20  Yes [provider]  nitroGLYCERIN (NITROSTAT) 0.4 MG SL tablet Place 0.4 mg under the tongue every 5 (five) minutes as needed for chest pain.   Yes [provider]  Warfarin Sodium (COUMADIN PO) Take 7.5 mg by mouth at bedtime. Pt does not know what strength   Yes [provider]    Current Facility-Administered Medications  Medication Dose Route Frequency Provider Last Rate Last Admin  . 0.9 %  sodium chloride infusion  250 mL Intravenous Continuous Nani Skillern, PA-C 1 mL/hr at 07/04/20 0534 250 mL at 07/04/20 0534  . 0.9 %  sodium chloride infusion  250 mL Intravenous PRN Lajuana Matte, MD      . aspirin EC tablet 325 mg  325 mg Oral Daily Lars Pinks M, PA-C   325 mg at 07/09/20 0858  . atorvastatin (LIPITOR) tablet 40 mg  40 mg Oral Daily Lars Pinks M, PA-C   40 mg at 07/09/20 [id number]  . bisacodyl  (DULCOLAX) EC tablet 10 mg  10 mg Oral Daily Lars Pinks M, PA-C   10 mg at 07/09/20 [id number]   Or  . bisacodyl (DULCOLAX) suppository 10 mg  10  mg Rectal Daily Lars Pinks M, PA-C      . carvedilol (COREG) tablet 6.25 mg  6.25 mg Oral BID WC Lajuana Matte, MD   6.25 mg at 07/09/20 0906  . chlorhexidine (PERIDEX) 0.12 % solution 15 mL  15 mL Mouth Rinse BID Lightfoot, Lucile Crater, MD   15 mL at 07/09/20 1139  . Chlorhexidine Gluconate Cloth 2 % PADS 6 each  6 each Topical Daily Lajuana Matte, MD   6 each at 07/09/20 0859  . docusate sodium (COLACE) capsule 200 mg  200 mg Oral Daily Lars Pinks M, PA-C   200 mg at 07/07/20 0258  . enoxaparin (LOVENOX) injection 40 mg  40 mg Subcutaneous QHS Lajuana Matte, MD   40 mg at 07/08/20 2129  . furosemide (LASIX) tablet 40 mg  40 mg Oral Daily Lajuana Matte, MD   40 mg at 07/09/20 0857  . lactated ringers infusion   Intravenous Continuous Lars Pinks M, PA-C      . lactated ringers infusion   Intravenous Continuous Nani Skillern, PA-C   Stopped at 07/06/20 0825  . lidocaine (LIDODERM) 5 % 2 patch  2 patch Transdermal Q24H Lajuana Matte, MD   2 patch at 07/09/20 1140  . MEDLINE mouth rinse  15 mL Mouth Rinse q12n4p Lightfoot, Harrell O, MD   15 mL at 07/08/20 1125  . methocarbamol (ROBAXIN) tablet 500 mg  500 mg Oral TID Lajuana Matte, MD   500 mg at 07/09/20 0858  . ondansetron (ZOFRAN) injection 4 mg  4 mg Intravenous Q6H PRN Lars Pinks M, PA-C      . pantoprazole (PROTONIX) EC tablet 40 mg  40 mg Oral Daily Lars Pinks M, PA-C   40 mg at 07/09/20 0857  . potassium chloride SA (KLOR-CON) CR tablet 40 mEq  40 mEq Oral Daily Lajuana Matte, MD   40 mEq at 07/09/20 0858  . sodium chloride flush (NS) 0.9 % injection 10-40 mL  10-40 mL Intracatheter Q12H Lightfoot, Harrell O, MD   10 mL at 07/09/20 0900  . sodium chloride flush (NS) 0.9 % injection 10-40 mL  10-40  mL Intracatheter PRN Lightfoot, Harrell O, MD      . sodium chloride flush (NS) 0.9 % injection 3 mL  3 mL Intravenous Q12H Lars Pinks M, PA-C   3 mL at 07/09/20 0900  . sodium chloride flush (NS) 0.9 % injection 3 mL  3 mL Intravenous PRN Lars Pinks M, PA-C      . sodium chloride flush (NS) 0.9 % injection 3 mL  3 mL Intravenous Q12H Lightfoot, Harrell O, MD   3 mL at 07/09/20 0900  . sodium chloride flush (NS) 0.9 % injection 3 mL  3 mL Intravenous PRN Lightfoot, Lucile Crater, MD      . spironolactone (ALDACTONE) tablet 25 mg  25 mg Oral Daily Rexene Alberts, MD   25 mg at 07/09/20 0858  . traMADol (ULTRAM) tablet 50-100 mg  50-100 mg Oral Q4H PRN Lars Pinks M, PA-C   100 mg at 07/09/20 1139    Allergies as of 06/29/2020 - Review Complete 06/29/2020  Allergen Reaction Noted  . Codeine Itching 12/27/2007    Family History  Problem Relation Age of Onset  . Hypertension Neg Hx   . Diabetes Neg Hx   . Coronary artery disease Neg Hx     Social History   Socioeconomic History  . Marital status: Single  Spouse name: Not on file  . Number of children: Not on file  . Years of education: Not on file  . Highest education level: High school graduate  Occupational History  . Occupation: Not actively working    Comment: applied for disability x 2.   Tobacco Use  . Smoking status: Current Every Day Smoker    Packs/day: 1.00    Years: 22.00    Pack years: 22.00    Types: Cigarettes, Cigars  . Smokeless tobacco: Never Used  . Tobacco comment: cigarettes stopped in 2020, swisher sweets started in 2018-04-05/day  Vaping Use  . Vaping Use: Never used  Substance and Sexual Activity  . Alcohol use: Never  . Drug use: Not Currently    Types: Marijuana  . Sexual activity: Never  Other Topics Concern  . Not on file  Social History Narrative   Single   Lives with his parents   Trying to get his GED   DeGent date all cultures   Social Determinants of Health    Financial Resource Strain: High Risk  . Difficulty of Paying Living Expenses: Hard  Food Insecurity: No Food Insecurity  . Worried About Charity fundraiser in the Last Year: Never true  . Ran Out of Food in the Last Year: Never true  Transportation Needs: No Transportation Needs  . Lack of Transportation (Medical): No  . Lack of Transportation (Non-Medical): No  Physical Activity: Not on file  Stress: Not on file  Social Connections: Not on file  Intimate Partner Violence: Not on file    Review of Systems: All systems reviewed and negative except where noted in HPI.  OBJECTIVE:    Physical Exam: Vital signs in last 24 hours: Temp:  [97.6 F (36.4 C)-98.5 F (36.9 C)] 98.2 F (36.8 C) (05/10 1136) Pulse Rate:  [92-106] 96 (05/10 1136) Resp:  [15-24] 24 (05/10 1136) BP: (95-113)/(60-76) 105/60 (05/10 1136) SpO2:  [91 %-98 %] 91 % (05/10 1224) Weight:  [80.8 kg] 80.8 kg (05/10 0402) Last BM Date: 07/07/20 General:   Alert  Thin male in NAD Psych:  Pleasant, cooperative. Normal mood and affect. Eyes:  Pupils equal, sclera clear, no icterus.   Conjunctiva pink. Ears:  Normal auditory acuity. Nose:  No deformity, discharge,  or lesions. Neck:  Supple; no masses Lungs:  Clear throughout to auscultation.   No wheezes, crackles, or rhonchi.  Heart:  Regular rate and rhythm; no murmurs, no lower extremity edema Abdomen:  Soft, non-distended, nontender, BS active, no palp mass   Rectal:  No blood in vault. Scant light brown stool.   Msk:  Symmetrical without gross deformities. . Neurologic:  Alert and  oriented x4;  grossly normal neurologically. Skin:  Intact without significant lesions or rashes.  Filed Weights   07/07/20 0500 07/08/20 0500 07/09/20 0402  Weight: 83.8 kg 80.8 kg 80.8 kg     Scheduled inpatient medications . aspirin EC  325 mg Oral Daily  . atorvastatin  40 mg Oral Daily  . bisacodyl  10 mg Oral Daily   Or  . bisacodyl  10 mg Rectal Daily  .  carvedilol  6.25 mg Oral BID WC  . chlorhexidine  15 mL Mouth Rinse BID  . Chlorhexidine Gluconate Cloth  6 each Topical Daily  . docusate sodium  200 mg Oral Daily  . enoxaparin (LOVENOX) injection  40 mg Subcutaneous QHS  . furosemide  40 mg Oral Daily  . lidocaine  2 patch Transdermal Q24H  .  mouth rinse  15 mL Mouth Rinse q12n4p  . methocarbamol  500 mg Oral TID  . pantoprazole  40 mg Oral Daily  . potassium chloride  40 mEq Oral Daily  . sodium chloride flush  10-40 mL Intracatheter Q12H  . sodium chloride flush  3 mL Intravenous Q12H  . sodium chloride flush  3 mL Intravenous Q12H  . spironolactone  25 mg Oral Daily      Intake/Output from previous day: 05/09 0701 - 05/10 0700 In: 452.5 [P.O.:440; I.V.:12.5] Out: 1450 [Urine:1450] Intake/Output this shift: Total I/O In: 240 [P.O.:240] Out: -    Lab Results: Recent Labs    07/07/20 0431 07/08/20 0338 07/09/20 0127  WBC 10.5 10.5 9.7  HGB 10.1* 9.5* 9.7*  HCT 31.4* 29.8* 31.2*  PLT 240 359 417*   BMET Recent Labs    07/07/20 0431 07/08/20 0338 07/09/20 0127  NA 135 136 137  K 3.8 4.4 4.4  CL 101 103 104  CO2 25 23 25   GLUCOSE 107* 85 92  BUN 9 10 13   CREATININE 0.76 0.94 0.89  CALCIUM 7.7* 7.9* 7.9*   LFT Recent Labs    07/08/20 0338  PROT 5.8*  ALBUMIN 2.3*  AST 29  ALT 17  ALKPHOS 103  BILITOT 0.8   PT/INR No results for input(s): LABPROT, INR in the last 72 hours. Hepatitis Panel No results for input(s): HEPBSAG, HCVAB, HEPAIGM, HEPBIGM in the last 72 hours.   . CBC Latest Ref Rng & Units 07/09/2020 07/08/2020 07/07/2020  WBC 4.0 - 10.5 K/uL 9.7 10.5 10.5  Hemoglobin 13.0 - 17.0 g/dL 9.7(L) 9.5(L) 10.1(L)  Hematocrit 39.0 - 52.0 % 31.2(L) 29.8(L) 31.4(L)  Platelets 150 - 400 K/uL 417(H) 359 240    . CMP Latest Ref Rng & Units 07/09/2020 07/08/2020 07/07/2020  Glucose 70 - 99 mg/dL 92 85 107(H)  BUN 6 - 20 mg/dL 13 10 9   Creatinine 0.61 - 1.24 mg/dL 0.89 0.94 0.76  Sodium 135 - 145  mmol/L 137 136 135  Potassium 3.5 - 5.1 mmol/L 4.4 4.4 3.8  Chloride 98 - 111 mmol/L 104 103 101  CO2 22 - 32 mmol/L 25 23 25   Calcium 8.9 - 10.3 mg/dL 7.9(L) 7.9(L) 7.7(L)  Total Protein 6.5 - 8.1 g/dL - 5.8(L) -  Total Bilirubin 0.3 - 1.2 mg/dL - 0.8 -  Alkaline Phos 38 - 126 U/L - 103 -  AST 15 - 41 U/L - 29 -  ALT 0 - 44 U/L - 17 -   Studies/Results: No results found.  Active Problems:   Chest pain at rest   Non-ST elevation (NSTEMI) myocardial infarction [facility])   S/P CABG x 4   Coronary artery disease    Tye Savoy, NP-C @  07/09/2020, 12:54 PM

## 2020-07-09 NOTE — Progress Notes (Signed)
CARDIAC REHAB PHASE I   PRE:  Rate/Rhythm: 105 ST    BP: sitting 99/79    SaO2: 97 RA  MODE:  Ambulation: 470 ft   POST:  Rate/Rhythm: 128 ST    BP: sitting 106/71     SaO2: 93 RA  Pt now moving independently. No AD needed. Steady pace, some SOB and rest x1 but not as fatigued as yesterday, no diaphoresis. HR elevated to 128 ST. Return to recliner.  [id number]   [place], ACSM 07/09/2020 10:29 AM

## 2020-07-09 NOTE — Progress Notes (Signed)
Mobility Specialist - Progress Note   07/09/20 1501  Mobility  Activity Ambulated in hall  Level of Assistance Independent  Assistive Device None  Distance Ambulated (ft) 470 ft (235 ft x 2)  Mobility Ambulated independently in hallway  Mobility Response Tolerated well  Mobility performed by Mobility specialist  $Mobility charge 1 Mobility   Pre-mobility: 105 HR, 97% SpO2 During mobility: 116 HR, 100% SpO2 Post-mobility: 1118 HR, 97% SpO2  Pt on RA throughout ambulation. He required one seated rest break due to 1/4 DOE. Pt sitting up on edge of bed after walk.   Pricilla Handler Mobility Specialist Mobility Specialist Phone: [phone]

## 2020-07-09 NOTE — Progress Notes (Addendum)
Heart Failure Stewardship Pharmacist Progress Note   PCP: Neale Burly, MD PCP-Cardiologist: None    HPI:  35 yo M with early onset CAD - remote STEMI with LAD stent at age 1, and tobacco use. He presented to outpatient cardiology visit on 06/27/20 for stress test and ECHO. 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 He then presented to the ED on 06/29/20 with chest pain and elevated troponin. An ECHO was done at [facility] on 06/29/20 and LVEF read to be 40-45%. LHC done on 07/01/20 and found to have severe 3 vessel disease and occluded LAD at the site of the prior stent with collaterals. He is now s/p CABG x4 on 07/03/20.   Current HF Medications: PO furosemide 40 mg daily Carvedilol 6.25 mg BID Spironolactone 25 mg daily  Prior to admission HF Medications: Metoprolol XL 25 mg daily  Pertinent Lab Values: . Serum creatinine 0.89, BUN 13, Potassium 4.4, Sodium 137 . 07/08/20: BNP 1344  Vital Signs: . Weight: 178 lbs (admission weight: 172 lbs) . Blood pressure: 100/60s  . Heart rate: 90s   Medication Assistance / Insurance Benefits Check: Does the patient have prescription insurance?  No  Does the patient qualify for medication assistance through manufacturers or grants?   Yes . Eligible grants and/or patient assistance programs: pending . Medication assistance applications in progress: none  . Medication assistance applications approved: none Approve medication assistance renewals will be completed by: pending  Outpatient Pharmacy:  Prior to admission outpatient pharmacy: [facility] Is the patient willing to use [facility] pharmacy at discharge? Yes Is the patient willing to transition their outpatient pharmacy to utilize a [facility] outpatient pharmacy?   Pending    Assessment: 1. Acute systolic CHF (EF 73%), due to ICM for CABG tomorrow. NYHA class II symptoms. - Does not appear volume  overloaded on MD exam, remains on 2L [place], weight still up 7 lbs from preop - continue furosemide 40 mg PO daily - Continue carvedilol 6.25 mg BID - BP remains too soft for Entresto at this time - Continue spironolactone 25 mg daily - Consider starting Farxiga 10 mg daily   Plan: 1) Medication changes recommended at this time: - Add Farxiga 10 mg daily  2) Patient assistance: - Can register for patient assistance for [place] and Farxiga once initiated - HF CSW following to help with insurance  3)  Education  - To be completed prior to discharge  Richardine Service, PharmD, [place] PGY2 Cardiology Pharmacy Resident  Kerby Nora, PharmD, BCPS Heart Failure Stewardship Pharmacist Phone [phone]

## 2020-07-10 ENCOUNTER — Other Ambulatory Visit (HOSPITAL_COMMUNITY): Payer: Self-pay

## 2020-07-10 DIAGNOSIS — I25119 Atherosclerotic heart disease of native coronary artery with unspecified angina pectoris: Secondary | ICD-10-CM

## 2020-07-10 MED ORDER — OXYCODONE-ACETAMINOPHEN 5-325 MG PO TABS
1.0000 | ORAL_TABLET | ORAL | Status: DC | PRN
  Administered 2020-07-10: 1 via ORAL
  Filled 2020-07-10: qty 1

## 2020-07-10 MED ORDER — CARVEDILOL 6.25 MG PO TABS
6.2500 mg | ORAL_TABLET | Freq: Two times a day (BID) | ORAL | 2 refills | Status: DC
  Filled 2020-07-10: qty 60, 30d supply, fill #0

## 2020-07-10 MED ORDER — DAPAGLIFLOZIN PROPANEDIOL 10 MG PO TABS
10.0000 mg | ORAL_TABLET | Freq: Every day | ORAL | 2 refills | Status: DC
  Filled 2020-07-10: qty 30, 30d supply, fill #0

## 2020-07-10 MED ORDER — SPIRONOLACTONE 25 MG PO TABS
25.0000 mg | ORAL_TABLET | Freq: Every day | ORAL | 2 refills | Status: DC
  Filled 2020-07-10: qty 30, 30d supply, fill #0

## 2020-07-10 MED ORDER — ASPIRIN 325 MG PO TBEC
325.0000 mg | DELAYED_RELEASE_TABLET | Freq: Every day | ORAL | 0 refills | Status: DC
  Filled 2020-07-10: qty 30, 30d supply, fill #0

## 2020-07-10 MED ORDER — OXYCODONE-ACETAMINOPHEN 5-325 MG PO TABS
1.0000 | ORAL_TABLET | ORAL | 0 refills | Status: DC | PRN
  Filled 2020-07-10: qty 30, 5d supply, fill #0

## 2020-07-10 NOTE — Progress Notes (Addendum)
Progress Note  Patient Name: Wyatt Donovan Date of Encounter: 07/10/2020  [facility] Cardiologist: Sanda Klein, MD   Subjective   Hopeful to discharge home today.   Inpatient Medications    Scheduled Meds: . aspirin EC  325 mg Oral Daily  . atorvastatin  40 mg Oral Daily  . bisacodyl  10 mg Oral Daily   Or  . bisacodyl  10 mg Rectal Daily  . carvedilol  6.25 mg Oral BID WC  . chlorhexidine  15 mL Mouth Rinse BID  . Chlorhexidine Gluconate Cloth  6 each Topical Daily  . docusate sodium  200 mg Oral Daily  . enoxaparin (LOVENOX) injection  40 mg Subcutaneous QHS  . furosemide  40 mg Oral Daily  . lidocaine  2 patch Transdermal Q24H  . mouth rinse  15 mL Mouth Rinse q12n4p  . methocarbamol  500 mg Oral TID  . pantoprazole  40 mg Oral Daily  . potassium chloride  40 mEq Oral Daily  . sodium chloride flush  10-40 mL Intracatheter Q12H  . sodium chloride flush  3 mL Intravenous Q12H  . sodium chloride flush  3 mL Intravenous Q12H  . spironolactone  25 mg Oral Daily   Continuous Infusions: . sodium chloride 250 mL (07/04/20 0534)  . sodium chloride    . lactated ringers    . lactated ringers Stopped (07/06/20 0825)   PRN Meds: sodium chloride, ondansetron (ZOFRAN) IV, oxyCODONE-acetaminophen, phenylephrine, sodium chloride flush, sodium chloride flush, sodium chloride flush   Vital Signs    Vitals:   07/09/20 2322 07/10/20 0420 07/10/20 0500 07/10/20 0814  BP: 96/66 103/69  94/66  Pulse: 95 94 89 100  Resp: 19 16 12 20   Temp: 98.3 F (36.8 C) 98.1 F (36.7 C)  97.9 F (36.6 C)  TempSrc: Oral Oral  Oral  SpO2: 93% 96% 93% 96%  Weight:   80.4 kg   Height:        Intake/Output Summary (Last 24 hours) at 07/10/2020 1034 Last data filed at 07/09/2020 2252 Gross per 24 hour  Intake 480 ml  Output 875 ml  Net -395 ml   Last 3 Weights 07/10/2020 07/09/2020 07/08/2020  Weight (lbs) 177 lb 4 oz 178 lb 2.1 oz 178 lb 2.1 oz  Weight (kg) 80.4 kg 80.8 kg 80.8  kg      Telemetry    SR - Personally Reviewed  ECG    No new tracing  Physical Exam   GEN: No acute distress.   Neck: No JVD Cardiac: RRR, no murmurs, rubs, or gallops. Healing sternotomy  Respiratory: Clear to auscultation bilaterally. GI: Soft, nontender, non-distended  MS: No edema; No deformity. Neuro:  Nonfocal  Psych: Normal affect   Labs    High Sensitivity Troponin:   Recent Labs  Lab 06/29/20 0357 06/29/20 0549 06/29/20 1437 06/30/20 0544  TROPONINIHS 74* 366* 25,186* >27,000*      Chemistry Recent Labs  Lab 07/07/20 0431 07/08/20 0338 07/09/20 0127  NA 135 136 137  K 3.8 4.4 4.4  CL 101 103 104  CO2 25 23 25   GLUCOSE 107* 85 92  BUN 9 10 13   CREATININE 0.76 0.94 0.89  CALCIUM 7.7* 7.9* 7.9*  PROT  --  5.8*  --   ALBUMIN  --  2.3*  --   AST  --  29  --   ALT  --  17  --   ALKPHOS  --  103  --  BILITOT  --  0.8  --   GFRNONAA >60 >60 >60  ANIONGAP 9 10 8      Hematology Recent Labs  Lab 07/07/20 0431 07/08/20 0338 07/09/20 0127  WBC 10.5 10.5 9.7  RBC 3.25* 3.09* 3.18*  HGB 10.1* 9.5* 9.7*  HCT 31.4* 29.8* 31.2*  MCV 96.6 96.4 98.1  MCH 31.1 30.7 30.5  MCHC 32.2 31.9 31.1  RDW 15.4 15.5 15.7*  PLT 240 359 417*    BNP Recent Labs  Lab 07/08/20 0338  BNP 1,344.0*     DDimer No results for input(s): DDIMER in the last 168 hours.   Radiology    No results found.  Cardiac Studies   Echo: 06/29/20  IMPRESSIONS    1. 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 Left ventricular ejection fraction,  by estimation, is 40 to 45%. The  left ventricle has mildly decreased function. The left ventricle  demonstrates regional wall motion abnormalities (see scoring  diagram/findings for description). Left ventricular diastolic parameters  are consistent with Grade II diastolic dysfunction   (pseudonormalization). Elevated left atrial pressure. There is severe  hypokinesis of the entire left ventricular inferolateral wall. There is  moderate hypokinesis of the left ventricular inferior wall. There is  akinesis of a small segment of the apical  anterior wall.  2. Right ventricular systolic function is normal. The right ventricular  size is normal.  3. Left atrial size was mildly dilated.  4. The mitral valve is normal in structure. Mild mitral valve  regurgitation.  5. The aortic valve is normal in structure. Aortic valve regurgitation is  not visualized.   Comparison(s): No prior Echocardiogram.   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   Patient Profile     35 y.o. male with a very early onset CAD and remote anterior STEMI with LAD stent at age 34, presenting with unstable angina, evolved to posterior wall infarction and mild reduction in overall LVEF.Echocardiogram shows a possible apical left ventricular thrombus, but this appears to be organized possibly calcified.  MRI demonstrates viable anterior wall indeterminate lateral wall and nonviable inferior wall.  Four-vessel CABG 07/03/2020 with LIMA to LAD, free radial to circumflex, SVG to diagonal, and SVG to PDA.  Assessment & Plan    1. Acute inferoposterior STEMI: Now status post multivessel CABG with, right radial access, SVG to PDA, SVG to diagonal. Treated with ASA, statin, BB, spiro and farxiga.  -- likely discharge today -- consideration given to DAPT with STEMI presentation but given his rectal bleeding, he is treated only with ASA  2. Dyslipidemia:   LDL 83 LP(a) 112.  -- Continue high intensity statin therapy. May consider lipid clinic referral given elevated LP (a) at outpatient follow up  3. Acute on chronic  combined systolic and diastolic heart failure: post op required pressor support.   -- treated with coreg 6.25mg  BID, spiro 25mg  daily and farxiga 10mg  daily. Consider Entresto as an outpatient if blood pressures can tolerate  4. LV thrombus: Noted on Echo on admission. Unclear whether this was old clot. Had previously been on coumadin PTA. Treatment per TCTS  5. Rectal bleeding: seen by GI, recommendation to follow up in the office.  CARDIOLOGY  RECOMMENDATIONS:  Discharge is anticipated in the next 48 hours. Recommendations for medications and follow up:  Discharge Medications: Continue medications as they are currently listed in the [facility].  Follow Up: The patient's Primary Cardiologist is Sanda Klein, MD  Follow up in the office in 2 week(s).  Signed,  Reino Bellis, NP  10:34 AM 07/10/2020  [facility]  Patient seen and examined. Agree with assessment and plan.  1 status post CABG surgery x4 after presenting with an acute ST segment elevation myocardial infarction and CHF.  Currently feels well.  For probable discharge today with cardiology follow-up with Dr. Sallyanne Kuster.   Troy Sine, MD, [facility] 07/10/2020 12:00 PM

## 2020-07-10 NOTE — Progress Notes (Signed)
Discussed IS, sternal precautions, diet, smoking cessation, exercise, daily wts, and CRPII with pt and dad. Very receptive. He sts he is done smoking. Gave resources/materials. Gave pt HF booklet and encouraged daily wts. Will refer to [place] [place].  [id number] Yves Dill CES, ACSM 12:49 PM 07/10/2020

## 2020-07-10 NOTE — Progress Notes (Addendum)
Heart Failure Stewardship Pharmacist Progress Note   PCP: Neale Burly, MD PCP-Cardiologist: None    HPI:  35 yo M with early onset CAD - remote STEMI with LAD stent at age 25, and tobacco use. He presented to outpatient cardiology visit on 06/27/20 for stress test and ECHO. 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 He then presented to the ED on 06/29/20 with chest pain and elevated troponin. An ECHO was done at [facility] on 06/29/20 and LVEF read to be 40-45%. LHC done on 07/01/20 and found to have severe 3 vessel disease and occluded LAD at the site of the prior stent with collaterals. He is now s/p CABG x4 on 07/03/20.   Current HF Medications: PO furosemide 40 mg daily Carvedilol 6.25 mg BID Spironolactone 25 mg daily  Prior to admission HF Medications: Metoprolol XL 25 mg daily  Pertinent Lab Values: . 07/09/20: Serum creatinine 0.89, BUN 13, Potassium 4.4, Sodium 137 . 07/08/20: BNP 1344  Vital Signs: . Weight: 177 lbs (admission weight: 172 lbs) . Blood pressure: 90/60s  . Heart rate: 90s   Medication Assistance / Insurance Benefits Check: Does the patient have prescription insurance?  No  Does the patient qualify for medication assistance through manufacturers or grants?   Yes . Eligible grants and/or patient assistance programs: pending . Medication assistance applications in progress: none  . Medication assistance applications approved: none Approve medication assistance renewals will be completed by: pending  Outpatient Pharmacy:  Prior to admission outpatient pharmacy: [facility] Is the patient willing to use [place] pharmacy at discharge? Yes Is the patient willing to transition their outpatient pharmacy to utilize a [facility] outpatient pharmacy?   Pending    Assessment: 1. Acute systolic CHF (EF 25%), due to ICM for CABG tomorrow. NYHA class II symptoms. - Does not appear  volume overloaded on MD exam, remains on 2L [place], weight still up 7 lbs from preop - continue furosemide 40 mg PO daily - Continue carvedilol 6.25 mg BID - BP remains too soft for Entresto at this time - Continue spironolactone 25 mg daily - Consider starting Farxiga 10 mg daily   Plan: 1) Medication changes recommended at this time: - Agree with starting Farxiga 10 mg daily at discharge  2) Patient assistance: - Will plan to apply for patient assistance for Farxiga at follow-up HF TOC appointment - HF CSW following to help with insurance  3)  Education  - Completed 07/10/20 - TOC HF appointment scheduled for 07/16/20  Richardine Service, PharmD, [place] PGY2 Cardiology Pharmacy Resident  Kerby Nora, PharmD, BCPS Heart Failure Stewardship Pharmacist Phone [phone]

## 2020-07-10 NOTE — TOC Transition Note (Signed)
Transition of Care [facility]) - CM/SW Discharge Note Heart Failure   Patient Details  Name: TRISTIAN SICKINGER MRN: [id number] Date of Birth: 1986-01-15  Transition of Care [facility]) CM/SW Contact:  [place], [place] Phone Number: 07/10/2020, 11:40 AM   Clinical Narrative:    CSW spoke with the patient at bedside and brought patient an appointment card and reiterated to the patient to follow up with the [place] clinic appointment and to come to that appointment so that he can receive help with his medications and also discussed about transportation. CSW called [facility] and set up transportation for his appointment Friday with Dr. Kipp Brood and for Thursday 07/16/20 at the [place] clinic.  CSW will sign off for now as social work intervention is no longer needed. Please consult [place] again if new needs arise.  Final next level of care: Home/Self Care Barriers to Discharge: No Barriers Identified   Patient Goals and CMS Choice        Discharge Placement                       Discharge Plan and Services In-house Referral: Clinical Social Work                                   Social Determinants of Health (SDOH) Interventions Food Insecurity Interventions: Other (Comment) (has food stamps) Financial Strain Interventions: Development worker, community (already seen pt.) Housing Interventions: Intervention Not Indicated Transportation Interventions: Intervention Not Indicated   Readmission Risk Interventions No flowsheet data found.    Kelicia Youtz, MSW, [place] Heart Failure Social Worker

## 2020-07-10 NOTE — Progress Notes (Addendum)
Heart Failure Nurse Navigator Progress Note  Spoke briefly with patient over phone, HF CSW ([place]) at bedside per pt statement. Confirmed pt has HV TOC appt scheduled for 5/17, attempted to confirm transportation. Pt stated CSW speaking with patient about same concern.   CSW enrolled and set up transportation for upcoming appt. Gave pt [facility] [employer] number. Pt educated to bring pill box, medication bottles, and financial aid information packet back to assist with filling out the rest of the forms. Pt concerned about "price of one of the medications next month will be $500", encouraged pt to come to [place] clinic for medication patient assistance.  Pt appreciated all the assistance/care.  Appreciate collaboration.  Pricilla Holm, RN, BSN Heart Failure Nurse Navigator [phone]

## 2020-07-10 NOTE — Progress Notes (Signed)
      [address]       [place] [address]             [phone]      7 Days Post-Op Procedure(s) (LRB): CORONARY ARTERY BYPASS GRAFTING (CABG) times four using left internal mammary artery, right arm radial artery and right leg saphenous vein (N/A) RADIAL ARTERY HARVEST (Right) TRANSESOPHAGEAL ECHOCARDIOGRAM (TEE) (N/A) Subjective: Tearful, says he is having sternal pain that is not controlled by the Ultram. He says it has not been working for him for 3 days.  Walked in the hall  Several times yesterday. Tolerating PO's. Having BM's. Currently on RA with acceptable O2 sats.   Objective: Vital signs in last 24 hours: Temp:  [97.7 F (36.5 C)-98.5 F (36.9 C)] 98.1 F (36.7 C) (05/11 0420) Pulse Rate:  [89-100] 89 (05/11 0500) Cardiac Rhythm: Normal sinus rhythm (05/10 1900) Resp:  [12-24] 12 (05/11 0500) BP: (91-113)/(60-71) 103/69 (05/11 0420) SpO2:  [91 %-97 %] 93 % (05/11 0500) Weight:  [80.4 kg] 80.4 kg (05/11 0500)     Intake/Output from previous day: 05/10 0701 - 05/11 0700 In: 720 [P.O.:720] Out: 875 [Urine:875] Intake/Output this shift: No intake/output data recorded.  General appearance: alert, cooperative and no distress Neurologic: intact Heart: NSR / borderline sinus tach. No significant arrhythmias. Lungs: Breath sounds CTA.  Abdomen: soft and non-tender Extremities: no edema. The RUE and RLE incisions are intact and dry.  Wound: the sternotomy has some dried bloody drainage, otherwise intact. The sternum is stable.   Lab Results: Recent Labs    07/08/20 0338 07/09/20 0127  WBC 10.5 9.7  HGB 9.5* 9.7*  HCT 29.8* 31.2*  PLT 359 417*   BMET:  Recent Labs    07/08/20 0338 07/09/20 0127  NA 136 137  K 4.4 4.4  CL 103 104  CO2 23 25  GLUCOSE 85 92  BUN 10 13  CREATININE 0.94 0.89  CALCIUM 7.9* 7.9*    PT/INR: No results for input(s): LABPROT, INR in the last 72 hours. ABG    Component Value Date/Time   PHART 7.385  07/04/2020 0258   HCO3 20.5 07/04/2020 0258   TCO2 22 07/04/2020 0258   ACIDBASEDEF 4.0 (H) 07/04/2020 0258   O2SAT 69.5 07/08/2020 0338   CBG (last 3)  Recent Labs    07/07/20 1124 07/07/20 1526 07/07/20 1946  GLUCAP 94 74 91    Assessment/Plan: S/P Procedure(s) (LRB): CORONARY ARTERY BYPASS GRAFTING (CABG) times four using left internal mammary artery, right arm radial artery and right leg saphenous vein (N/A) RADIAL ARTERY HARVEST (Right) TRANSESOPHAGEAL ECHOCARDIOGRAM (TEE) (N/A)  -POD-7 CABG x 4 after presenting with acute STEMI and CHF. EF 40-45% pre-op.  He is progressing well. Currently on ASA, carvedilol, atorvastatin, Lasix, and spiro.  BP stable, mildly tachycardic. Suspect this is related to pain.   -Expected acute blood loss anemia- Hct trending up.   -Bloody stool- appreciate GI eval. He will follow up with his gastroenterologist after discharge.   -DVT PPX- on daily SQ enoxaparin.   -Disposition- Will switch his oral analgesic to Percocet.  Plan to discharge later today when pain controlled.    LOS: 11 days    Antony Odea, [place] [phone] 07/10/2020

## 2020-07-10 NOTE — Progress Notes (Signed)
Discharge instructions given to patient. Dad present. Telemetry box removed, CCMD notified. PIVs removed. Sutures removed, no drainage. Patient tolerated well. Personal belongings packed and taken to vehicle by Dad. Patient taken by wheelchair to car by staff.  Daymon Larsen, RN

## 2020-07-10 NOTE — Progress Notes (Signed)
Mobility Specialist - Progress Note   07/10/20 1123  Mobility  Activity Ambulated in hall  Level of Assistance Independent  Assistive Device None  Distance Ambulated (ft) 470 ft  Mobility Ambulated independently in hallway  Mobility Response Tolerated well  Mobility performed by Mobility specialist  $Mobility charge 1 Mobility   Pre-mobility: 106 HR During mobility: 116 HR Post-mobility: 99 HR  Pt asx throughout ambulation. Pt to recliner after walk, father in room.   Pricilla Handler Mobility Specialist Mobility Specialist Phone: [phone]

## 2020-07-12 ENCOUNTER — Other Ambulatory Visit: Payer: Self-pay

## 2020-07-12 ENCOUNTER — Telehealth (INDEPENDENT_AMBULATORY_CARE_PROVIDER_SITE_OTHER): Payer: Self-pay | Admitting: Thoracic Surgery (Cardiothoracic Vascular Surgery)

## 2020-07-12 DIAGNOSIS — R911 Solitary pulmonary nodule: Secondary | ICD-10-CM

## 2020-07-12 NOTE — Progress Notes (Signed)
     [address]       [place] [address]             [phone]       Patient: Home Provider: Office Consent for Telemedicine visit obtained.  Today's visit was completed via a real-time telehealth (see specific modality noted below). The patient/authorized person provided oral consent at the time of the visit to engage in a telemedicine encounter with the present provider at [facility]. The patient/authorized person was informed of the potential benefits, limitations, and risks of telemedicine. The patient/authorized person expressed understanding that the laws that protect confidentiality also apply to telemedicine. The patient/authorized person acknowledged understanding that telemedicine does not provide emergency services and that he or she would need to call 911 or proceed to the nearest hospital for help if such a need arose.  . Total time spent in the clinical discussion 5 minutes. . Telehealth Modality: Phone visit (audio only)  I had a telephone visit with Wyatt Donovan.  He states that he is doing well.  He is ambulating about 60 minutes a day.  There is no issues with the incisions and his pain is well controlled.  His only complaint is occasional he wakes up in the middle the night short of breath.  He denies any swelling to his lower extremities.  He is scheduled to meet with the heart failure clinic next week.  I will see him back in 1 month with a chest x-ray.

## 2020-07-15 NOTE — Progress Notes (Signed)
HEART & VASCULAR TRANSITION OF CARE CONSULT NOTE     Referring Physician:Dr Croitoru Primary Care: None  Primary Cardiologist: Dr Sallyanne Kuster CT Surgery: Dr Kipp Brood  HPI: Referred to clinic by Dr Kipp Brood  for heart failure consultation.   Wyatt Donovan is a 35 year old with an extensive history of coronary disease with his 1st MI at that age of 78,  Had PCI LAD in 2012,  hyperlipidemia, and tobacco abuse. ECHO has been 40-45% for several years.    He has been lost to cardiology follow-up due to lack of insurance. He was last seen in the emergency department in February 2021 with recurrence of chest pain and was started back on medical therapy with Toprol and Imdur.   Saw Dr Candis Musa in April 2022. Prior to that he had been out of meds for at least 12 months due to cost.    Presented to ED on 4/30/ 22 with chest pain--> NSTEMI. HS Troponin >25,000. Transferred to [facility] for work up. Had cath with 3 vessel coronary disease. CT surgery consulted. CMRI showed acceptable viability for CABG. On 07/03/20 he underwent CABGx4. Post op course complicated by GI bleed. GI consulted but he declined scope and planned to follow up as an outpatient. Started on farxiga, spironolactone, and carvedilol.   He presents today with his father. Overall feeling a ok. Says he gets tired easily.  SOB with exertion. Needs rest breaks. Denies PND/Orthopnea. Walking 10 minutes a day. Appetite ok. No fever or chills. Weight at home 160-165  pounds. Taking all medications but does not have lipitor. Lives  With his Dad and brother. Unemployed. Has no medical insurance.   Social: Has difficulty with transportation and paying for medications .Limited reading ability    Cardiac Testing  LHC 06/30/2020   Prox LAD to Mid LAD lesion is 100% stenosed.  Ost RCA to Prox RCA lesion is 100% stenosed.  Prox Cx to Mid Cx lesion is 95% stenosed.  Referred to CT surgery for CABG    ECHO 06/29/20 1. 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 Left ventricular ejection fraction,  by estimation, is 40 to 45%. The  left ventricle has mildly decreased function. The left ventricle  demonstrates regional wall motion abnormalities (see scoring  diagram/findings for description). Left ventricular diastolic parameters  are consistent with Grade II diastolic dysfunction  (pseudonormalization). Elevated left atrial pressure. There is severe  hypokinesis of the entire left ventricular inferolateral wall. There is  moderate hypokinesis of the left ventricular inferior wall. There is  akinesis of a small segment of the apical  anterior wall.  2. Right ventricular systolic function is normal. The right ventricular  size is normal.  3. Left atrial size was mildly dilated.  4. The mitral valve is normal in structure. Mild mitral valve  regurgitation.  5. The aortic valve is normal in structure. Aortic valve regurgitation is  not visualized.   CMRI 07/02/2020  1. Acute MI in the in the basal to mid inferoseptal/inferior/inferolateral walls and apical lateral wall, with transmural LGE and areas of microvascular obstruction vs intramyocardial hemorrhage. Unable to assess this territory for viability in setting of acute MI 2. LAD territory appears viable. There is subendocardial LGE consistent with prior infarct in mid anteroseptal, apical septal/inferior walls and apex. LGE is greater than 50% transmural suggesting nonviability only at the very  distal apex. 3.  LV apical thrombus measures 65mm x 76mm 4. Mild LV dilatation with severe systolic dysfunction (EF 83%). Akinesis of basal to mid inferior/inferoseptal/inferolateral, mid anterolateral, and apical inferior/septal/lateral walls. 5.  Normal RV size and systolic function (EF 15%)    Review of Systems: [y] = yes, [  ] = no   . General: Weight gain [ ] ; Weight loss [ ] ; Anorexia [ ] ; Fatigue [Y ]; Fever [ ] ; Chills [ ] ; Weakness [Y ]  . Cardiac: Chest pain/pressure [ ] ; Resting SOB [ ] ; Exertional SOB [Y ]; Orthopnea [ ] ; Pedal Edema [ ] ; Palpitations [ ] ; Syncope [ ] ; Presyncope [ ] ; Paroxysmal nocturnal dyspnea[ ]   . Pulmonary: Cough [ ] ; Wheezing[ ] ; Hemoptysis[ ] ; Sputum [ ] ; Snoring [ ]   . GI: Vomiting[ ] ; Dysphagia[ ] ; Melena[ ] ; Hematochezia [ ] ; Heartburn[ ] ; Abdominal pain [ ] ; Constipation [ ] ; Diarrhea [ ] ; BRBPR [ ]   . GU: Hematuria[ ] ; Dysuria [ ] ; Nocturia[ ]   . Vascular: Pain in legs with walking [Y ]; Pain in feet with lying flat [ ] ; Non-healing sores [ ] ; Stroke [ ] ; TIA [ ] ; Slurred speech [ ] ;  . Neuro: Headaches[ ] ; Vertigo[ ] ; Seizures[ ] ; Paresthesias[ ] ;Blurred vision [ ] ; Diplopia [ ] ; Vision changes [ ]   . Ortho/Skin: Arthritis [ ] ; Joint pain [Y ]; Muscle pain [ ] ; Joint swelling [ ] ; Back Pain [ ] ; Rash [ ]   . Psych: Depression[ ] ; Anxiety[ ]   . Heme: Bleeding problems [ ] ; Clotting disorders [ ] ; Anemia [ ]   . Endocrine: Diabetes [ ] ; Thyroid dysfunction[ ]    Past Medical History:  Diagnosis Date  . Atypical chest pain   . CAD (coronary artery disease)   . Dyslipidemia   . Ex-smoker   . History of depression   . Insomnia   . Ischemic cardiomyopathy    Ejection fraction 40-45%  . NSTEMI (non-ST elevated myocardial infarction) ([place]) 08/2007   Treated with a bare metal stent to the proximal LAD  . Suicide attempt ([place]) 01/2001    Current Outpatient Medications  Medication Sig Dispense Refill  . aspirin 325 MG EC tablet Take 1 tablet (325 mg total) by mouth daily. 30 tablet 0  . atorvastatin (LIPITOR) 80 MG tablet Take 1 tablet (80 mg total) by mouth daily. 30 tablet 2  . dapagliflozin propanediol (FARXIGA) 10 MG TABS tablet Take 1 tablet (10 mg total) by mouth daily before breakfast. 30 tablet 2  . oxyCODONE-acetaminophen (PERCOCET/ROXICET) 5-325 MG tablet Take 1  tablet by mouth every 4 (four) hours as needed for moderate pain. 30 tablet 0  . carvedilol (COREG) 6.25 MG tablet Take 1 tablet (6.25 mg total) by mouth 2 (two) times daily with a meal. 60 tablet 2  . nitroGLYCERIN (NITROSTAT) 0.4 MG SL tablet Place 0.4 mg under the tongue every 5 (five) minutes as needed for chest pain. (Patient not taking: Reported on 07/16/2020)    . spironolactone (ALDACTONE) 25 MG tablet Take 1 tablet (25 mg total) by mouth daily. 30 tablet 2   No current facility-administered medications for this encounter.    Allergies  Allergen Reactions  . Codeine Itching      Social History   Socioeconomic History  . Marital status: Single    Spouse name: Not on file  . Number of children: Not on file  . Years of education: Not on file  . Highest education level: High school graduate  Occupational History  .  Occupation: Not actively working    Comment: applied for disability x 2.   Tobacco Use  . Smoking status: Former Smoker    Packs/day: 1.00    Years: 22.00    Pack years: 22.00    Types: Cigarettes, Cigars    Quit date: 06/28/2020    Years since quitting: 0.0  . Smokeless tobacco: Never Used  . Tobacco comment: cigarettes stopped in 2020, swisher sweets started in 2018-04-05/day  Vaping Use  . Vaping Use: Never used  Substance and Sexual Activity  . Alcohol use: Never  . Drug use: Not Currently    Types: Marijuana  . Sexual activity: Never  Other Topics Concern  . Not on file  Social History Narrative   Single   Lives with his parents   Trying to get his GED   DeGent date all cultures   Social Determinants of Health   Financial Resource Strain: High Risk  . Difficulty of Paying Living Expenses: Hard  Food Insecurity: No Food Insecurity  . Worried About Charity fundraiser in the Last Year: Never true  . Ran Out of Food in the Last Year: Never true  Transportation Needs: No Transportation Needs  . Lack of Transportation (Medical): No  . Lack of  Transportation (Non-Medical): No  Physical Activity: Not on file  Stress: Not on file  Social Connections: Not on file  Intimate Partner Violence: Not on file      Family History  Problem Relation Age of Onset  . Hypertension Neg Hx   . Diabetes Neg Hx   . Coronary artery disease Neg Hx     Vitals:   07/16/20 1514  BP: 90/76  Pulse: (!) 110  SpO2: 95%  Weight: 74.8 kg (164 lb 12.8 oz)   Wt Readings from Last 3 Encounters:  07/16/20 74.8 kg (164 lb 12.8 oz)  07/10/20 80.4 kg (177 lb 4 oz)    Reds Clip 30%.  PHYSICAL EXAM: General:  Appears pale. No respiratory difficulty HEENT: normal Neck: supple. no JVD. Carotids 2+ bilat; no bruits. No lymphadenopathy or thryomegaly appreciated. Cor: PMI nondisplaced. Regular rate & rhythm. No rubs, gallops or murmurs. Sternal incision approximated.  Lungs: clear Abdomen: soft, nontender, nondistended. No hepatosplenomegaly. No bruits or masses. Good bowel sounds. Extremities: no cyanosis, clubbing, rash, edema. RUE incision approximated.  Neuro: alert & oriented x 3, cranial nerves grossly intact. moves all 4 extremities w/o difficulty. Affect pleasant.  ECG: N/A    ASSESSMENT & PLAN: 1.Chronic Systolic Heart failure , ICM  Echo 06/29/20 EF 40-45%  NYHAIII. Volume status stable. Reds Clip 30%.  Diuretic-does not need diuretics.  Continue coreg 6.25 mg twice a day  BP to soft for ARNi Continue farxiga 10 mg daily.  Continue spiro 25 mg daily   2. S/P CABG x4 Restart high intensity statin--> atorvastatin 80 mg daily  Continue asa 325 mg daily. Reduce 80 mg per CT surgery Start cardiac rehab if can get assistance  3. Hyperlipidemia  Starting atorvastatin 80 mg today.  4. Tobacco Abuse Has not smoked since most recent admit. Need to avoid restarting.     Referred to HFSW- yes--> PCP, Medications, Transportation,  Insurance,&  Financial  Refer to Pharmacy: Yes --> medications  Refer to Home Health:  No Refer to Advanced  Heart Failure Clinic: Yes   Refer to General Cardiology: Share care with Dr Loleta Chance  Will need to close follow up in the clinic. Needs additional resources.  Follow up 2-3 weeks  with APP and 8 week with Dr Aundra Dubin.

## 2020-07-16 ENCOUNTER — Other Ambulatory Visit (HOSPITAL_COMMUNITY): Payer: Self-pay

## 2020-07-16 ENCOUNTER — Other Ambulatory Visit: Payer: Self-pay

## 2020-07-16 ENCOUNTER — Telehealth (HOSPITAL_COMMUNITY): Payer: Self-pay | Admitting: Adult Health

## 2020-07-16 ENCOUNTER — Encounter (HOSPITAL_COMMUNITY): Payer: Self-pay

## 2020-07-16 ENCOUNTER — Ambulatory Visit (HOSPITAL_COMMUNITY)
Admit: 2020-07-16 | Discharge: 2020-07-16 | Disposition: A | Discharge status: Home / Self Care | Payer: Medicaid Other | Attending: Cardiology | Admitting: Cardiology

## 2020-07-16 VITALS — BP 90/76 | HR 110 | Wt 164.8 lb

## 2020-07-16 DIAGNOSIS — Z7982 Long term (current) use of aspirin: Secondary | ICD-10-CM | POA: Diagnosis not present

## 2020-07-16 DIAGNOSIS — E7849 Other hyperlipidemia: Secondary | ICD-10-CM

## 2020-07-16 DIAGNOSIS — I252 Old myocardial infarction: Secondary | ICD-10-CM | POA: Diagnosis not present

## 2020-07-16 DIAGNOSIS — Z955 Presence of coronary angioplasty implant and graft: Secondary | ICD-10-CM | POA: Diagnosis not present

## 2020-07-16 DIAGNOSIS — Z79899 Other long term (current) drug therapy: Secondary | ICD-10-CM | POA: Insufficient documentation

## 2020-07-16 DIAGNOSIS — I5022 Chronic systolic (congestive) heart failure: Secondary | ICD-10-CM

## 2020-07-16 DIAGNOSIS — I25118 Atherosclerotic heart disease of native coronary artery with other forms of angina pectoris: Secondary | ICD-10-CM

## 2020-07-16 DIAGNOSIS — E785 Hyperlipidemia, unspecified: Secondary | ICD-10-CM | POA: Diagnosis not present

## 2020-07-16 DIAGNOSIS — Z720 Tobacco use: Secondary | ICD-10-CM

## 2020-07-16 DIAGNOSIS — I340 Nonrheumatic mitral (valve) insufficiency: Secondary | ICD-10-CM | POA: Diagnosis not present

## 2020-07-16 DIAGNOSIS — Z87891 Personal history of nicotine dependence: Secondary | ICD-10-CM | POA: Insufficient documentation

## 2020-07-16 LAB — BASIC METABOLIC PANEL
Anion gap: 9 (ref 5–15)
BUN: 19 mg/dL (ref 6–20)
CO2: 24 mmol/L (ref 22–32)
Calcium: 9.5 mg/dL (ref 8.9–10.3)
Chloride: 105 mmol/L (ref 98–111)
Creatinine, Ser: 0.99 mg/dL (ref 0.61–1.24)
GFR, Estimated: >60 mL/min (ref >60)
Glucose, Bld: 91 mg/dL (ref 70–99)
Potassium: 5.9 mmol/L — ABNORMAL HIGH (ref 3.5–5.1)
Sodium: 138 mmol/L (ref 135–145)

## 2020-07-16 LAB — ECHO INTRAOPERATIVE TEE
Height: 73 in
Weight: 2740.76 oz

## 2020-07-16 LAB — CBC
HCT: 39.5 % (ref 39.0–52.0)
Hemoglobin: 12.1 g/dL — ABNORMAL LOW (ref 13.0–17.0)
MCH: 30.1 pg (ref 26.0–34.0)
MCHC: 30.6 g/dL (ref 30.0–36.0)
MCV: 98.3 fL (ref 80.0–100.0)
Platelets: 718 10*3/uL — ABNORMAL HIGH (ref 150–400)
RBC: 4.02 MIL/uL — ABNORMAL LOW (ref 4.22–5.81)
RDW: 14.6 % (ref 11.5–15.5)
WBC: 10.8 10*3/uL — ABNORMAL HIGH (ref 4.0–10.5)
nRBC: 0 % (ref 0.0–0.2)

## 2020-07-16 MED ORDER — ATORVASTATIN CALCIUM 80 MG PO TABS
80.0000 mg | ORAL_TABLET | Freq: Every day | ORAL | 2 refills | Status: DC
  Filled 2020-07-16: qty 30, 30d supply, fill #0
  Filled 2020-08-21: qty 30, 30d supply, fill #1

## 2020-07-16 MED ORDER — CARVEDILOL 6.25 MG PO TABS
6.2500 mg | ORAL_TABLET | Freq: Two times a day (BID) | ORAL | 2 refills | Status: DC
  Filled 2020-07-16 – 2020-08-21 (×2): qty 60, 30d supply, fill #0

## 2020-07-16 MED ORDER — SPIRONOLACTONE 25 MG PO TABS
25.0000 mg | ORAL_TABLET | Freq: Every day | ORAL | 2 refills | Status: DC
  Filled 2020-07-16: qty 30, 30d supply, fill #0

## 2020-07-16 NOTE — Patient Instructions (Signed)
CHANGE Lipitor to 80 mg, one tab daily  Labs today We will only contact you if something comes back abnormal or we need to make some changes. Otherwise no news is good news!  Your physician has requested that you regularly monitor and record your blood pressure readings at home. Please use the same machine at the same time of day to check your readings and record them to bring to your follow-up visit.  Your physician recommends that you schedule a follow-up appointment in: 2 weeks  in the Advanced Practitioners (PA/NP) Clinic and in 8 weeks with Dr Aundra Dubin  Do the following things EVERYDAY: 1) Weigh yourself in the morning before breakfast. Write it down and keep it in a log. 2) Take your medicines as prescribed 3) Eat low salt foods--Limit salt (sodium) to 2000 mg per day.  4) Stay as active as you can everyday 5) Limit all fluids for the day to less than 2 liters  At the [facility], you and your health needs are our priority. As part of our continuing mission to provide you with exceptional heart care, we have created designated Provider Care Teams. These Care Teams include your primary Cardiologist (physician) and Advanced Practice Providers (APPs- Physician Assistants and Nurse Practitioners) who all work together to provide you with the care you need, when you need it.   You may see any of the following providers on your designated Care Team at your next follow up: Marland Kitchen Dr Glori Bickers . Dr Loralie Champagne . Dr Vickki Muff . Darrick Grinder, NP . Lyda Jester, [place] . Audry Riles, PharmD   Please be sure to bring in all your medications bottles to every appointment.   If you have any questions or concerns before your next appointment please send [place] a message through [place] or call our office at [phone].    TO LEAVE A MESSAGE FOR THE NURSE SELECT OPTION 2, PLEASE LEAVE A MESSAGE INCLUDING: . YOUR NAME . DATE OF BIRTH . CALL BACK  NUMBER . REASON FOR CALL**this is important as we prioritize the call backs  YOU WILL RECEIVE A CALL BACK THE SAME DAY AS LONG AS YOU CALL BEFORE 4:00 PM

## 2020-07-16 NOTE — Progress Notes (Signed)
Heart and Vascular Care Navigation  07/16/2020  Wyatt Donovan [birth year] [id number]  Reason for Referral: disability/insurance, PCP   Engaged with patient face to face for initial visit for Heart and Vascular Care Coordination.                                                                                                   Assessment: CSW met with pt and pt dad during clinic visit to discuss above concerns.  CSW spoke with financial counseling who is planning on assist pt with medicaid/disability but needs forms signed- they emailed to [place] who assisted pt in filling out then scanned and emailed back to Wyatt Donovan.  CSW then discussed pt lack of PCP at this time as well as struggling to pay for medications.  Pt states he had actually been seeing someone but would prefer to switch.  CSW explained [facility] program to help with PCP, dental needs, and medication assistance and pt is agreeable to referral- referral sent to Wyatt Donovan for review and follow up.                                       HRT/VAS Care Coordination    Outpatient Care Team Social Worker   Social Worker Name: Wyatt Donovan- Advanced HF Clinic- [phone]   Living arrangements for the past 2 months Single Family Home   Lives with: Parents; Siblings   Patient Current Insurance Coverage Self-Pay   Patient Has Concern With Paying Medical Bills Yes   Medical Bill Referrals: financial counseling assisting with disability and medicaid   Patient Prescription Assistance Programs Heart Failure Fund; Other   Other Assistance Programs Medications [facility] Assistive Devices/Equipment None   DME Agency NA   [facility] Agency NA      Social History:                                                                             SDOH Screenings   Alcohol Screen: Low Risk   . Last Alcohol Screening Score (AUDIT): 0  Depression (PHQ2-9): Not on file  Financial Resource Strain:  High Risk  . Difficulty of Paying Living Expenses: Hard  Food Insecurity: No Food Insecurity  . Worried About Charity fundraiser in the Last Year: Never true  . Ran Out of Food in the Last Year: Never true  Housing: Low Risk   . Last Housing Risk Score: 0  Physical Activity: Not on file  Social Connections: Not on file  Stress: Not on file  Tobacco Use: Medium Risk  . Smoking Tobacco Use: Former Smoker  . Smokeless Tobacco Use: Never Used  Transportation Needs: No Transportation  Needs  . Lack of Transportation (Medical): No  . Lack of Transportation (Non-Medical): No    SDOH Interventions: Financial Resources:   dependent on dads income  Food Insecurity:   none at this time  Housing Insecurity:   none at this time  Transportation:    using Radio producer    Other Care Navigation Interventions:     Inpatient/Outpatient Substance Abuse Counseling/Rehab Options N/A  Provided Pharmacy assistance resources Heart Failure Fund,Other Patient assistance applications completed  Patient expressed Mental Health concerns None reported   Follow-up plan:    Pt to work with Building control surveyor on [employer] and with [employer] for disability.  Pt and father encouraged to reach out with any other concerns.  Wyatt Ny, LCSW Clinical Social Worker Advanced Heart Failure Clinic Desk#: [phone] Cell#: [phone]

## 2020-07-16 NOTE — Telephone Encounter (Signed)
K 5.9  I called and provided his father with lab results. Advised to stop spironolactone and avoid high potassium foods such as tomatoes/bananas.  Will need repeat BMET Thursday. We will try to set up BMET in [place].   Rickys father verbalized understanding .   Saba Gomm NP-C  8:30 PM

## 2020-07-16 NOTE — Progress Notes (Signed)
Heart and Vascular Center Transitions of [facility] Heart Failure Pharmacist Encounter  PCP: Neale Burly, MD PCP-Cardiologist: Sanda Klein, MD  Completed application for [place] and [place] Patient Assistance Program sent in an effort to reduce the patient's out of pocket expense for Farxiga to $0.    Application completed and faxed to [phone]   [employer] patient assistance phone number for follow up is [phone].   All refills dispensed from [place] will be filled by [place] using HF fund.   Kerby Nora, PharmD, BCPS Heart Failure Transitions of Care Clinic Pharmacist [phone]

## 2020-07-17 ENCOUNTER — Telehealth (HOSPITAL_COMMUNITY): Payer: Self-pay | Admitting: Cardiology

## 2020-07-17 DIAGNOSIS — I25118 Atherosclerotic heart disease of native coronary artery with other forms of angina pectoris: Secondary | ICD-10-CM

## 2020-07-17 DIAGNOSIS — I5022 Chronic systolic (congestive) heart failure: Secondary | ICD-10-CM

## 2020-07-17 NOTE — Telephone Encounter (Signed)
-----   Message from Conrad [place], NP sent at 07/16/2020  8:27 PM EDT ----- I called and spoke with his father.Potassium high 5.9 today. Instructed to stop spironolactone and to avoid high potassium foods such as tomatoes or bananas. We will set up repeat BMET for Thursday.Will try to set up in [facility] if possible could he gets labs BMET at [facility]? Please call tomorrow.

## 2020-07-17 NOTE — Telephone Encounter (Signed)
Pt aware via father Will have repeat labs done 07/18/20 Order placed

## 2020-07-17 NOTE — Telephone Encounter (Signed)
Pt aware of results via father Pt will report [facility] [place] placed

## 2020-07-18 ENCOUNTER — Other Ambulatory Visit (HOSPITAL_COMMUNITY)
Admission: RE | Admit: 2020-07-18 | Discharge: 2020-07-18 | Disposition: A | Discharge status: Home / Self Care | Payer: Medicaid Other | Source: Ambulatory Visit | Attending: Adult Health | Admitting: Adult Health

## 2020-07-18 ENCOUNTER — Other Ambulatory Visit: Payer: Self-pay

## 2020-07-18 DIAGNOSIS — I5022 Chronic systolic (congestive) heart failure: Secondary | ICD-10-CM | POA: Diagnosis present

## 2020-07-18 LAB — BASIC METABOLIC PANEL
Anion gap: 11 (ref 5–15)
BUN: 22 mg/dL — ABNORMAL HIGH (ref 6–20)
CO2: 23 mmol/L (ref 22–32)
Calcium: 9.4 mg/dL (ref 8.9–10.3)
Chloride: 104 mmol/L (ref 98–111)
Creatinine, Ser: 1.02 mg/dL (ref 0.61–1.24)
GFR, Estimated: >60 mL/min (ref >60)
Glucose, Bld: 102 mg/dL — ABNORMAL HIGH (ref 70–99)
Potassium: 4.9 mmol/L (ref 3.5–5.1)
Sodium: 138 mmol/L (ref 135–145)

## 2020-07-22 NOTE — Progress Notes (Deleted)
Cardiology Office Note:    Date:  07/22/2020   ID:  Wyatt Donovan, DOB [birth year], MRN [id number]  PCP:  Neale Burly, MD  Cardiologist:  Sanda Klein, MD   Referring MD: Neale Burly, MD   No chief complaint on file. ***  History of Present Illness:    Wyatt Donovan is a 35 y.o. male with a hx of ***  Past Medical History:  Diagnosis Date  . Atypical chest pain   . CAD (coronary artery disease)   . Dyslipidemia   . Ex-smoker   . History of depression   . Insomnia   . Ischemic cardiomyopathy    Ejection fraction 40-45%  . NSTEMI (non-ST elevated myocardial infarction) ([place]) 08/2007   Treated with a bare metal stent to the proximal LAD  . Suicide attempt ([place]) 01/2001    Past Surgical History:  Procedure Laterality Date  . ADENOIDECTOMY    . CORONARY ARTERY BYPASS GRAFT N/A 07/03/2020   Procedure: CORONARY ARTERY BYPASS GRAFTING (CABG) times four using left internal mammary artery, right arm radial artery and right leg saphenous vein;  Surgeon: Lajuana Matte, MD;  Location: [facility];  Service: Open Heart Surgery;  Laterality: N/A;  . CORONARY STENT PLACEMENT     Bare metal stent to proximal LAD  . LEFT HEART CATH AND CORONARY ANGIOGRAPHY N/A 07/01/2020   Procedure: LEFT HEART CATH AND CORONARY ANGIOGRAPHY;  Surgeon: Lorretta Harp, MD;  Location: [facility];  Service: Cardiovascular;  Laterality: N/A;  . RADIAL ARTERY HARVEST Right 07/03/2020   Procedure: RADIAL ARTERY HARVEST;  Surgeon: Lajuana Matte, MD;  Location: [facility];  Service: Open Heart Surgery;  Laterality: Right;  . TEE WITHOUT CARDIOVERSION N/A 07/03/2020   Procedure: TRANSESOPHAGEAL ECHOCARDIOGRAM (TEE);  Surgeon: Lajuana Matte, MD;  Location: [facility];  Service: Open Heart Surgery;  Laterality: N/A;    Current Medications: No outpatient medications have been marked as taking for the 07/24/20 encounter (Appointment) with Ledora Bottcher, [place].     Allergies:   Codeine    Social History   Socioeconomic History  . Marital status: Single    Spouse name: Not on file  . Number of children: Not on file  . Years of education: Not on file  . Highest education level: High school graduate  Occupational History  . Occupation: Not actively working    Comment: applied for disability x 2.   Tobacco Use  . Smoking status: Former Smoker    Packs/day: 1.00    Years: 22.00    Pack years: 22.00    Types: Cigarettes, Cigars    Quit date: 06/28/2020    Years since quitting: 0.0  . Smokeless tobacco: Never Used  . Tobacco comment: cigarettes stopped in 2020, swisher sweets started in 2018-04-05/day  Vaping Use  . Vaping Use: Never used  Substance and Sexual Activity  . Alcohol use: Never  . Drug use: Not Currently    Types: Marijuana  . Sexual activity: Never  Other Topics Concern  . Not on file  Social History Narrative   Single   Lives with his parents   Trying to get his GED   DeGent date all cultures   Social Determinants of Health   Financial Resource Strain: High Risk  . Difficulty of Paying Living Expenses: Hard  Food Insecurity: No Food Insecurity  . Worried About Charity fundraiser in the Last Year: Never true  . Ran Out of Food  in the Last Year: Never true  Transportation Needs: No Transportation Needs  . Lack of Transportation (Medical): No  . Lack of Transportation (Non-Medical): No  Physical Activity: Not on file  Stress: Not on file  Social Connections: Not on file     Family History: The patient's ***family history is negative for Hypertension, Diabetes, and Coronary artery disease.  ROS:   Please see the history of present illness.    *** All other systems reviewed and are negative.  EKGs/Labs/Other Studies Reviewed:    The following studies were reviewed today: ***  EKG:  EKG is *** ordered today.  The ekg ordered today demonstrates ***  Recent Labs: 06/29/2020: TSH 0.978 07/04/2020: Magnesium 2.7 07/08/2020: ALT 17; B  Natriuretic Peptide 1,344.0 07/16/2020: Hemoglobin 12.1; Platelets 718 07/18/2020: BUN 22; Creatinine, Ser 1.02; Potassium 4.9; Sodium 138  Recent Lipid Panel    Component Value Date/Time   CHOL 125 06/29/2020 0846   TRIG 75 06/29/2020 0846   HDL 27 (L) 06/29/2020 0846   CHOLHDL 4.6 06/29/2020 0846   VLDL 15 06/29/2020 0846   LDLCALC 83 06/29/2020 0846    Physical Exam:    VS:  There were no vitals taken for this visit.    Wt Readings from Last 3 Encounters:  07/16/20 164 lb 12.8 oz (74.8 kg)  07/10/20 177 lb 4 oz (80.4 kg)     GEN: *** Well nourished, well developed in no acute distress HEENT: Normal NECK: No JVD; No carotid bruits LYMPHATICS: No lymphadenopathy CARDIAC: ***RRR, no murmurs, rubs, gallops RESPIRATORY:  Clear to auscultation without rales, wheezing or rhonchi  ABDOMEN: Soft, non-tender, non-distended MUSCULOSKELETAL:  No edema; No deformity  SKIN: Warm and dry NEUROLOGIC:  Alert and oriented x 3 PSYCHIATRIC:  Normal affect   ASSESSMENT:    No diagnosis found. PLAN:    In order of problems listed above:  No diagnosis found.   Medication Adjustments/Labs and Tests Ordered: Current medicines are reviewed at length with the patient today.  Concerns regarding medicines are outlined above.  No orders of the defined types were placed in this encounter.  No orders of the defined types were placed in this encounter.   Signed, Ledora Bottcher, PA  07/22/2020 7:00 PM    [place]

## 2020-07-24 ENCOUNTER — Ambulatory Visit: Payer: Self-pay | Admitting: Physician Assistant

## 2020-07-26 ENCOUNTER — Telehealth (HOSPITAL_COMMUNITY): Payer: Self-pay | Admitting: Pharmacy Technician

## 2020-07-26 NOTE — Telephone Encounter (Signed)
Advanced Heart Failure Patient Advocate Encounter  Patient was approved to receive Farxiga from [employer]  Patient ID: [id number] Effective dates: 07/23/20 through 07/22/20  Called and spoke with the patient and provided phone number to [place], [phone].  Charlann Boxer, CPhT

## 2020-08-06 ENCOUNTER — Other Ambulatory Visit: Payer: Self-pay | Admitting: Thoracic Surgery (Cardiothoracic Vascular Surgery)

## 2020-08-06 DIAGNOSIS — Z951 Presence of aortocoronary bypass graft: Secondary | ICD-10-CM

## 2020-08-08 ENCOUNTER — Encounter (HOSPITAL_COMMUNITY): Payer: Self-pay

## 2020-08-08 ENCOUNTER — Ambulatory Visit (HOSPITAL_COMMUNITY)
Admission: RE | Admit: 2020-08-08 | Discharge: 2020-08-08 | Disposition: A | Discharge status: Home / Self Care | Payer: Medicaid Other | Source: Ambulatory Visit | Attending: Cardiology | Admitting: Cardiology

## 2020-08-08 ENCOUNTER — Other Ambulatory Visit: Payer: Self-pay

## 2020-08-08 VITALS — BP 98/52 | HR 71 | Wt 163.8 lb

## 2020-08-08 DIAGNOSIS — I255 Ischemic cardiomyopathy: Secondary | ICD-10-CM

## 2020-08-08 DIAGNOSIS — Z955 Presence of coronary angioplasty implant and graft: Secondary | ICD-10-CM | POA: Diagnosis not present

## 2020-08-08 DIAGNOSIS — Z8249 Family history of ischemic heart disease and other diseases of the circulatory system: Secondary | ICD-10-CM | POA: Diagnosis not present

## 2020-08-08 DIAGNOSIS — I5022 Chronic systolic (congestive) heart failure: Secondary | ICD-10-CM | POA: Diagnosis present

## 2020-08-08 DIAGNOSIS — I252 Old myocardial infarction: Secondary | ICD-10-CM | POA: Insufficient documentation

## 2020-08-08 DIAGNOSIS — Z596 Low income: Secondary | ICD-10-CM | POA: Insufficient documentation

## 2020-08-08 DIAGNOSIS — E785 Hyperlipidemia, unspecified: Secondary | ICD-10-CM | POA: Insufficient documentation

## 2020-08-08 DIAGNOSIS — Z7982 Long term (current) use of aspirin: Secondary | ICD-10-CM | POA: Diagnosis not present

## 2020-08-08 DIAGNOSIS — E782 Mixed hyperlipidemia: Secondary | ICD-10-CM

## 2020-08-08 DIAGNOSIS — I2510 Atherosclerotic heart disease of native coronary artery without angina pectoris: Secondary | ICD-10-CM | POA: Diagnosis not present

## 2020-08-08 DIAGNOSIS — Z79899 Other long term (current) drug therapy: Secondary | ICD-10-CM | POA: Insufficient documentation

## 2020-08-08 DIAGNOSIS — Z87891 Personal history of nicotine dependence: Secondary | ICD-10-CM | POA: Insufficient documentation

## 2020-08-08 DIAGNOSIS — Z885 Allergy status to narcotic agent status: Secondary | ICD-10-CM | POA: Diagnosis not present

## 2020-08-08 DIAGNOSIS — Z951 Presence of aortocoronary bypass graft: Secondary | ICD-10-CM | POA: Diagnosis not present

## 2020-08-08 LAB — COMPREHENSIVE METABOLIC PANEL
ALT: 41 U/L (ref 0–44)
AST: 26 U/L (ref 15–41)
Albumin: 3.1 g/dL — ABNORMAL LOW (ref 3.5–5.0)
Alkaline Phosphatase: 73 U/L (ref 38–126)
Anion gap: 8 (ref 5–15)
BUN: 8 mg/dL (ref 6–20)
CO2: 26 mmol/L (ref 22–32)
Calcium: 8.9 mg/dL (ref 8.9–10.3)
Chloride: 109 mmol/L (ref 98–111)
Creatinine, Ser: 0.88 mg/dL (ref 0.61–1.24)
GFR, Estimated: >60 mL/min (ref >60)
Glucose, Bld: 84 mg/dL (ref 70–99)
Potassium: 5.3 mmol/L — ABNORMAL HIGH (ref 3.5–5.1)
Sodium: 143 mmol/L (ref 135–145)
Total Bilirubin: 0.9 mg/dL (ref 0.3–1.2)
Total Protein: 6.8 g/dL (ref 6.5–8.1)

## 2020-08-08 LAB — LIPID PANEL
Cholesterol: 74 mg/dL (ref 0–200)
HDL: 19 mg/dL — ABNORMAL LOW (ref >40)
LDL Cholesterol: 39 mg/dL (ref 0–99)
Total CHOL/HDL Ratio: 3.9 RATIO
Triglycerides: 78 mg/dL (ref <150)
VLDL: 16 mg/dL (ref 0–40)

## 2020-08-08 MED ORDER — LOSARTAN POTASSIUM 25 MG PO TABS: 12.5000 mg | ORAL_TABLET | Freq: Every day | ORAL | 3 refills | Status: DC

## 2020-08-08 NOTE — Patient Instructions (Addendum)
Labs done today. We will contact you only if your labs are abnormal.  No medication changes were made. Please continue all current medications as prescribed.  Your physician recommends that you schedule a follow-up appointment in: 4 weeks  If you have any questions or concerns before your next appointment please send [place] a message through [place] or call our office at [phone].    TO LEAVE A MESSAGE FOR THE NURSE SELECT OPTION 2, PLEASE LEAVE A MESSAGE INCLUDING: YOUR NAME DATE OF BIRTH CALL BACK NUMBER REASON FOR CALL**this is important as we prioritize the call backs  YOU WILL RECEIVE A CALL BACK THE SAME DAY AS LONG AS YOU CALL BEFORE 4:00 PM   Do the following things EVERYDAY: Weigh yourself in the morning before breakfast. Write it down and keep it in a log. Take your medicines as prescribed Eat low salt foods--Limit salt (sodium) to 2000 mg per day.  Stay as active as you can everyday Limit all fluids for the day to less than 2 liters   At the [facility], you and your health needs are our priority. As part of our continuing mission to provide you with exceptional heart care, we have created designated Provider Care Teams. These Care Teams include your primary Cardiologist (physician) and Advanced Practice Providers (APPs- Physician Assistants and Nurse Practitioners) who all work together to provide you with the care you need, when you need it.   You may see any of the following providers on your designated Care Team at your next follow up: Dr Glori Bickers Dr Haynes Kerns, NP Lyda Jester, [place] Audry Riles, PharmD   Please be sure to bring in all your medications bottles to every appointment.

## 2020-08-08 NOTE — Progress Notes (Signed)
Advanced Heart Failure Clinic Note   Referring Physician: PCP: Neale Burly, MD PCP-Cardiologist: Sanda Klein, MD  AHFC: [name]   Reason for Visit: F/u for Systolic Heart Failure  HPI:   Wyatt Donovan is a 35 year old with an extensive history of coronary disease with his 1st MI at that age of 19,  Had PCI LAD in 2012,  hyperlipidemia, and tobacco abuse. ECHO has been 40-45% for several years.    He has been lost to cardiology follow-up due to lack of insurance. He was last seen in the emergency department in February 2021 with recurrence of chest pain and was started back on medical therapy with Toprol and Imdur.   Saw Dr Candis Musa in April 2022. Prior to that he had been out of meds for at least 12 months due to cost.     Presented to ED on 4/30/ 22 with chest pain--> NSTEMI. HS Troponin >25,000. Transferred to [facility] for work up. Had cath with 3 vessel coronary disease. CT surgery consulted. CMRI showed acceptable viability for CABG. On 07/03/20 he underwent CABGx4. Post op course complicated by GI bleed. GI consulted but he declined scope and planned to follow up as an outpatient. Started on farxiga, spironolactone, and carvedilol.   Had post hospital f/u on 07/16/20 and was doing ok. NYHA Class II-III.  Volume status was stable. ReDs Clip 30%. BP was too soft for med titration. BMP showed elevated K at 5.9 .Arlyce Harman was discontinued. Repeat labs showed normal K at 4.9.   Returns to clinic today for f/u. Here w/ his father. Doing fairly well. Wt stable, down 1 lb from prior visit. BP soft at 98/52 but no orthostatic symptoms. Walking ~15 min a day w/o having to stop. Denies LEE, orthopnea/PND. No ischemic CP. Reports full med compliance.   Review of systems complete and found to be negative unless listed in HPI.      Past Medical History:  Diagnosis Date   Atypical chest pain    CAD (coronary artery disease)    Dyslipidemia    Ex-smoker    History of depression    Insomnia     Ischemic cardiomyopathy    Ejection fraction 40-45%   NSTEMI (non-ST elevated myocardial infarction) ([place]) 08/2007   Treated with a bare metal stent to the proximal LAD   Suicide attempt ([place]) 01/2001    Current Outpatient Medications  Medication Sig Dispense Refill   aspirin 325 MG EC tablet Take 1 tablet (325 mg total) by mouth daily. 30 tablet 0   atorvastatin (LIPITOR) 80 MG tablet Take 1 tablet (80 mg total) by mouth daily. 30 tablet 2   carvedilol (COREG) 6.25 MG tablet Take 1 tablet (6.25 mg total) by mouth 2 (two) times daily with a meal. 60 tablet 2   dapagliflozin propanediol (FARXIGA) 10 MG TABS tablet Take 1 tablet (10 mg total) by mouth daily before breakfast. 30 tablet 2   nitroGLYCERIN (NITROSTAT) 0.4 MG SL tablet Place 0.4 mg under the tongue every 5 (five) minutes as needed for chest pain. (Patient not taking: No sig reported)     oxyCODONE-acetaminophen (PERCOCET/ROXICET) 5-325 MG tablet Take 1 tablet by mouth every 4 (four) hours as needed for moderate pain. (Patient not taking: Reported on 08/08/2020) 30 tablet 0   No current facility-administered medications for this encounter.    Allergies  Allergen Reactions   Codeine Itching      Social History   Socioeconomic History   Marital status:  Single    Spouse name: Not on file   Number of children: Not on file   Years of education: Not on file   Highest education level: High school graduate  Occupational History   Occupation: Not actively working    Comment: applied for disability x 2.   Tobacco Use   Smoking status: Former    Packs/day: 1.00    Years: 22.00    Pack years: 22.00    Types: Cigarettes, Cigars    Quit date: 06/28/2020    Years since quitting: 0.1   Smokeless tobacco: Never   Tobacco comments:    cigarettes stopped in 2020, swisher sweets started in 2018-04-05/day  Vaping Use   Vaping Use: Never used  Substance and Sexual Activity   Alcohol use: Never   Drug use: Not Currently    Types:  Marijuana   Sexual activity: Never  Other Topics Concern   Not on file  Social History Narrative   Single   Lives with his parents   Trying to get his GED   DeGent date all cultures   Social Determinants of Health   Financial Resource Strain: High Risk   Difficulty of Paying Living Expenses: Hard  Food Insecurity: No Food Insecurity   Worried About Charity fundraiser in the Last Year: Never true   Ran Out of Food in the Last Year: Never true  Transportation Needs: No Transportation Needs   Lack of Transportation (Medical): No   Lack of Transportation (Non-Medical): No  Physical Activity: Not on file  Stress: Not on file  Social Connections: Not on file  Intimate Partner Violence: Not on file      Family History  Problem Relation Age of Onset   Hypertension Neg Hx    Diabetes Neg Hx    Coronary artery disease Neg Hx     Vitals:   08/08/20 1534  BP: (!) 98/52  Pulse: 71  SpO2: 98%  Weight: 74.3 kg (163 lb 12.8 oz)     PHYSICAL EXAM: General:  Well appearing., thin WM No respiratory difficulty HEENT: normal Neck: supple. no JVD. Carotids 2+ bilat; no bruits. No lymphadenopathy or thyromegaly appreciated. Cor: PMI nondisplaced. Regular rate & rhythm. No rubs, gallops or murmurs. Lungs: clear Abdomen: soft, nontender, nondistended. No hepatosplenomegaly. No bruits or masses. Good bowel sounds. Extremities: no cyanosis, clubbing, rash, edema Neuro: alert & oriented x 3, cranial nerves grossly intact. moves all 4 extremities w/o difficulty. Affect pleasant.  ECG: not performed    ASSESSMENT & PLAN:  ASSESSMENT & PLAN: 1.Chronic Systolic Heart failure , ICM - Echo 06/29/20 EF 40-45% NYHA II-III. Volume status stable. Euvolemic on exam  - no need for loop diuretic - Continue farxiga 10 mg daily. - Continue Coreg 6.25 mg twice a day - Spiro discontinued due to hyperkalemia - Check BMP today. If K stable, will trial low dose losartan 12.5 mg qhs and plan repeat  BMP in 1 week (BP too soft for ARNi) - Discussed low sodium diet + daily wts    2. S/P CABG x4 - stable w/o CP  - c/w ASA + statin + ? blocker - Start cardiac rehab if can get assistance   3. Hyperlipidemia - LDL 4/22 was 83 mg/dL. Restarted on high intensity statin--> atorvastatin 80 mg daily - Repeat LP today + HFTs. If LDL not < 70, will add Zetia    4. Tobacco Abuse - Has not smoked since most recent admit. Need to avoid restarting.  F/u in 4 weeks w/ APP or PharmD for further attempts at med titration. Has F/u w/ [name] scheduled in 8 weeks    Lyda Jester, PA-C 08/08/20

## 2020-08-09 ENCOUNTER — Ambulatory Visit
Admission: RE | Admit: 2020-08-09 | Discharge: 2020-08-09 | Disposition: A | Discharge status: Home / Self Care | Payer: Medicaid Other | Source: Ambulatory Visit | Attending: Thoracic Surgery (Cardiothoracic Vascular Surgery) | Admitting: Thoracic Surgery (Cardiothoracic Vascular Surgery)

## 2020-08-09 ENCOUNTER — Other Ambulatory Visit: Payer: Self-pay | Admitting: *Deleted

## 2020-08-09 ENCOUNTER — Other Ambulatory Visit: Payer: Self-pay

## 2020-08-09 ENCOUNTER — Encounter: Payer: Self-pay | Admitting: Thoracic Surgery (Cardiothoracic Vascular Surgery)

## 2020-08-09 ENCOUNTER — Ambulatory Visit (INDEPENDENT_AMBULATORY_CARE_PROVIDER_SITE_OTHER): Payer: Self-pay | Admitting: Thoracic Surgery (Cardiothoracic Vascular Surgery)

## 2020-08-09 VITALS — BP 89/61 | HR 83 | Resp 20 | Ht 73.0 in | Wt 163.0 lb

## 2020-08-09 DIAGNOSIS — Z951 Presence of aortocoronary bypass graft: Secondary | ICD-10-CM

## 2020-08-09 IMAGING — DX DG CHEST 2V
2 series · 2 of 2 positions shown · non-contrast
Comparison: [DATE]

CLINICAL DATA: Follow-up CABG

EXAM:
CHEST - 2 VIEW

[dg chest 2 view (1 of 2)]
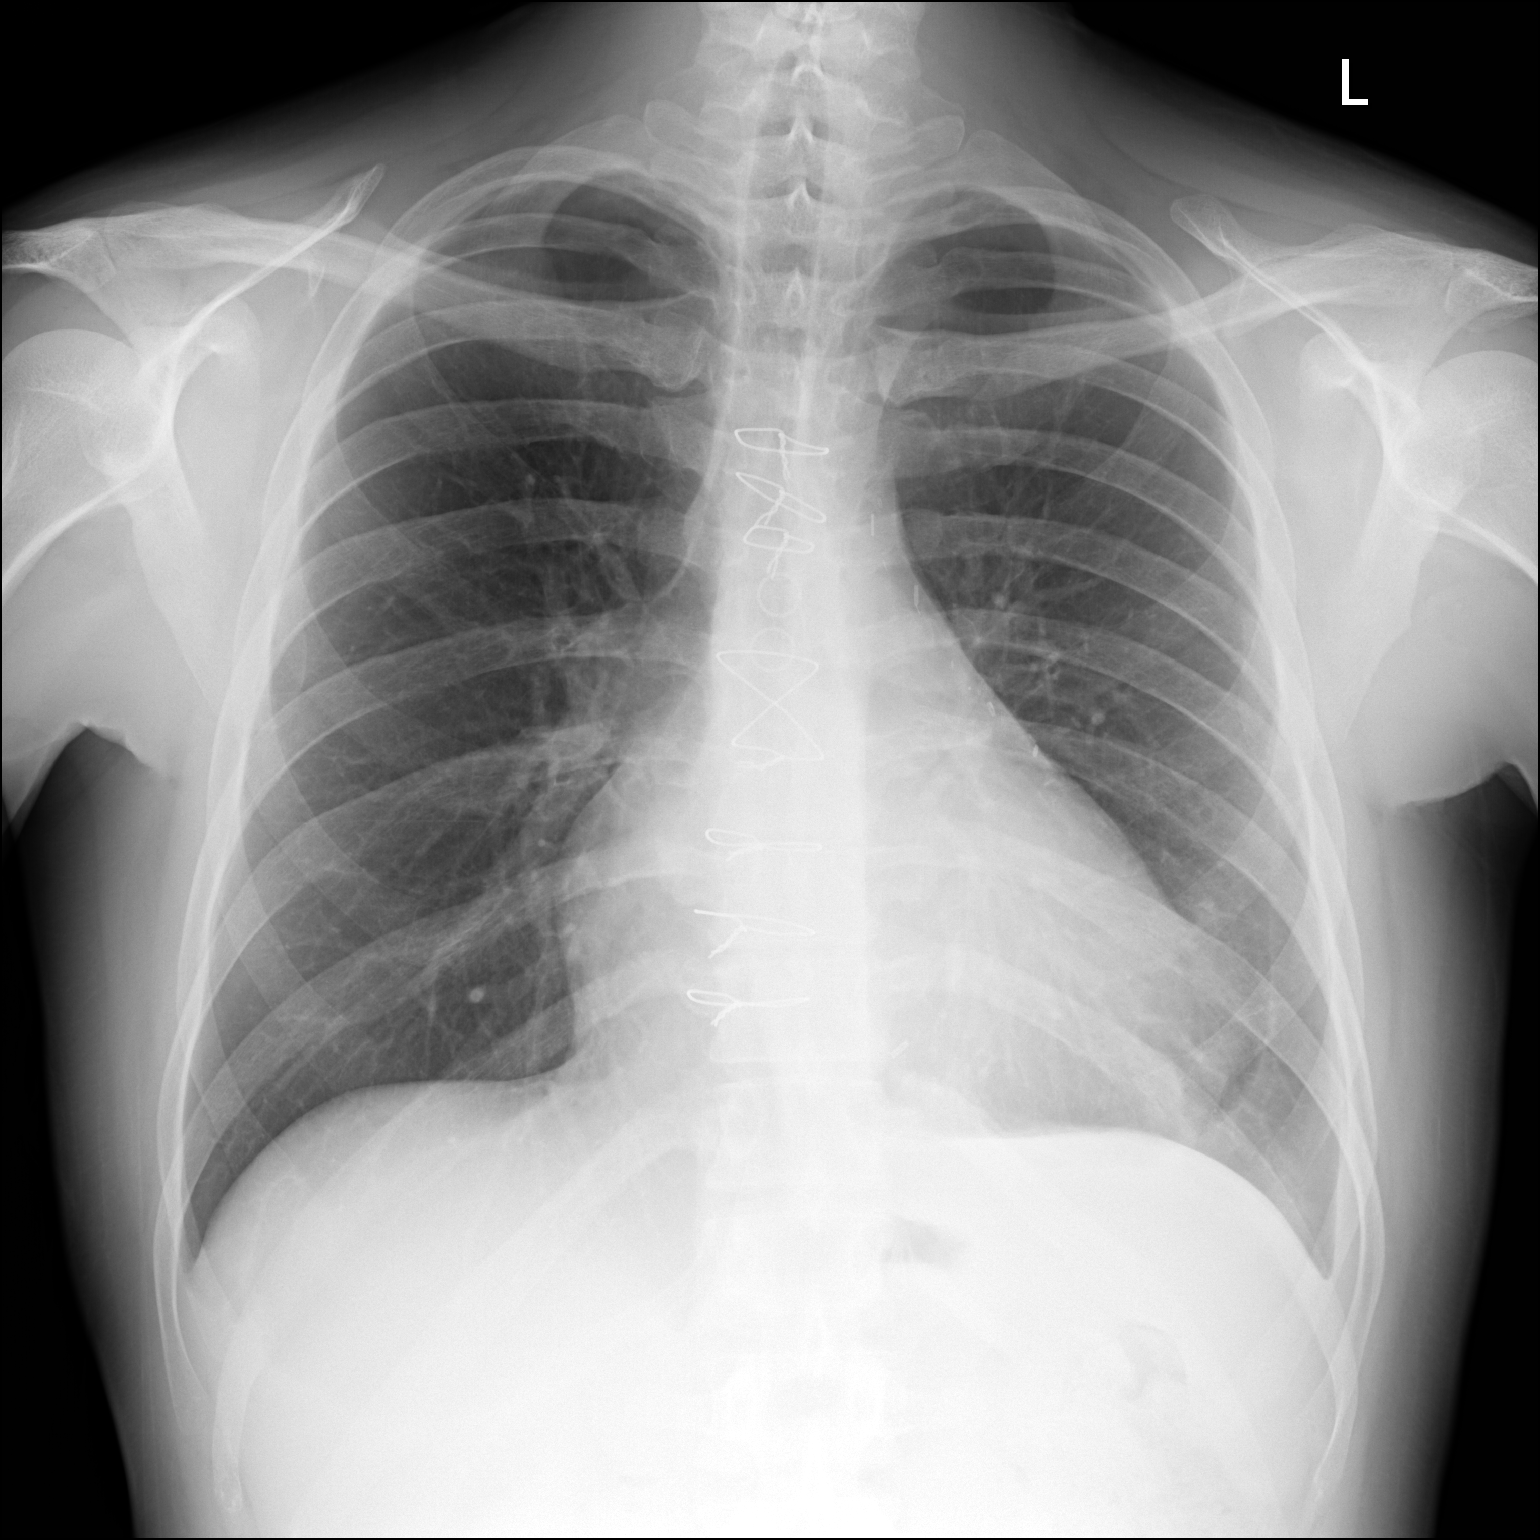

[dg chest 2 view (2 of 2)]
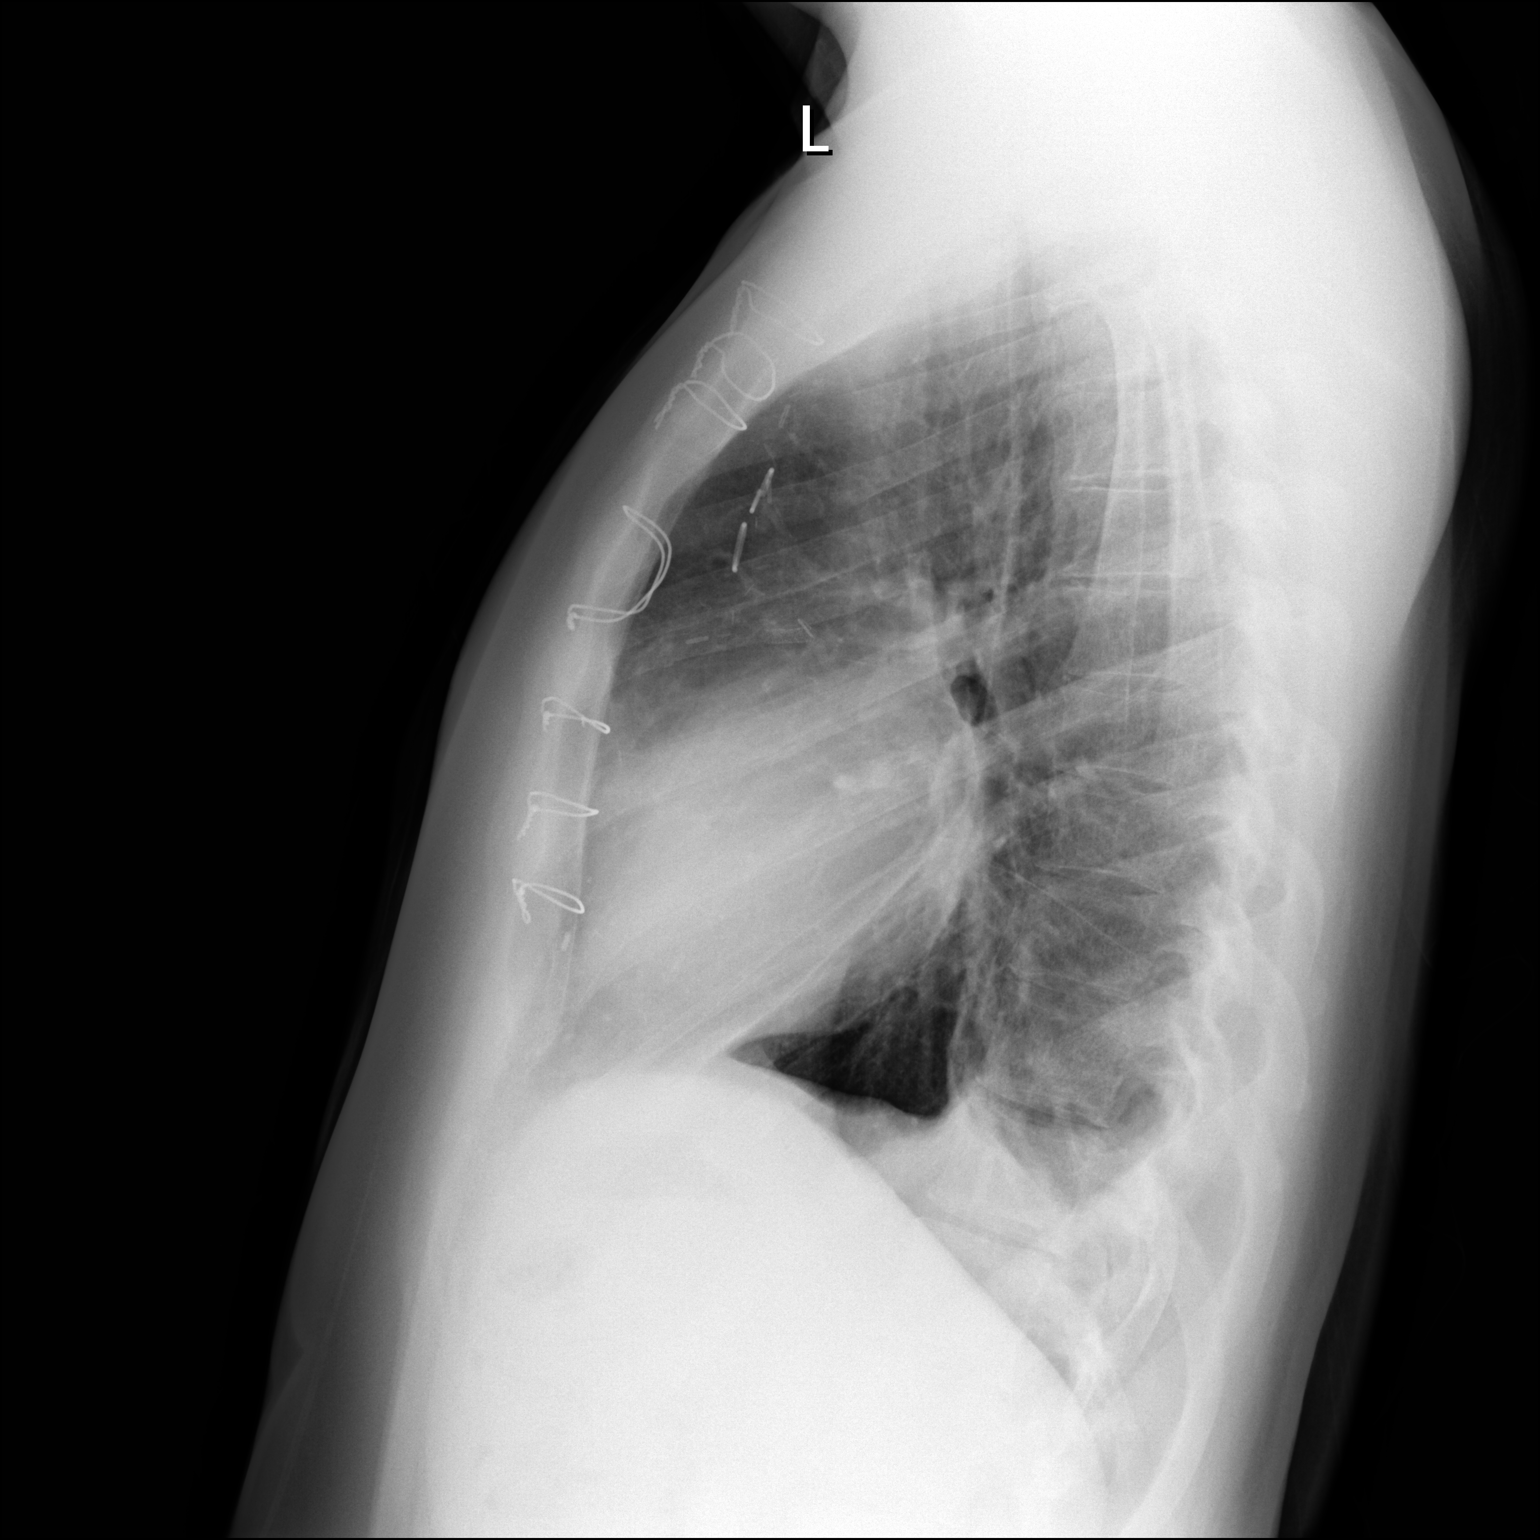

[2 of 2 positions shown; findings below may reference images not displayed]

FINDINGS: Previous median sternotomy and CABG. The lungs are clear except for
minimal atelectasis in the left lower lobe. No edema. No likely
effusion. Cannot rule out tiny effusion on the left. No significant
bone finding.
IMPRESSION: Good appearance. Only mild residual volume loss left base. No edema.

## 2020-08-09 NOTE — Progress Notes (Signed)
      [address]       [place] [address]             [phone]        Khan L Norrington [place] Medical Record #[id number] Date of Birth: Jun 19, 1985  Referring: Lorretta Harp, MD Primary Care: Neale Burly, MD Primary Cardiologist:Mihai Croitoru, MD  Reason for visit:   follow-up  History of Present Illness:     The patient comes in for his 1 month follow-up appointment.  Overall he is doing well.  He still remains quite deconditioned.  He complains of chest wall tenderness.  Physical Exam: BP (!) 89/61 (BP Location: Right Arm, Patient Position: Sitting)   Pulse 83   Resp 20   Ht 6\' 1"  (1.854 m)   Wt 163 lb (73.9 kg)   SpO2 97% Comment: RA  BMI 21.51 kg/m   Alert NAD Incision clean.  Sternum stable Abdomen soft, ND No peripheral edema   Diagnostic Studies & Laboratory data: CXR: Clear     Assessment / Plan:   35 year old male status post CABG.  He has a history of severe congestive heart failure.  Currently being followed in the heart failure clinic.  He is cleared from a surgical standpoint to start cardiac rehab.  He will follow-up as needed.   Lajuana Matte 08/09/2020 4:16 PM

## 2020-08-12 ENCOUNTER — Telehealth: Payer: Self-pay | Admitting: *Deleted

## 2020-08-12 NOTE — Telephone Encounter (Signed)
Mr. Wyatt Donovan contacted the office stating he is in 8 out of 10 pain s/p CABG 5/5 by Dr. Kipp Brood. Patient was seen by Dr. Kipp Brood in clinic on 6/10 in which patient reports he was told to take Tylenol every 8 hours for his pain. Patient requesting stronger pain medication at this time. Per T. Harriet Pho, [place], patient may try over the counter lidocaine patches on either side of his sternal incision to help manage his pain in addition to Tylenol. Patient informed an appointment must be made in order to receive a pain medication refill. Patient states he will re-contact the office to make this appointment. No further questions at this time.

## 2020-08-19 ENCOUNTER — Telehealth (HOSPITAL_COMMUNITY): Payer: Self-pay

## 2020-08-19 MED ORDER — LOKELMA 5 G PO PACK: 5.0000 g | PACK | Freq: Every day | ORAL | 0 refills | Status: AC

## 2020-08-19 NOTE — Telephone Encounter (Signed)
Patient advised and verbalized understanding. Med list updated to reflect changes, Rx sent into patients pharmacy.   Meds ordered this encounter  Medications   sodium zirconium cyclosilicate (LOKELMA) 5 g packet    Sig: Take 5 g by mouth daily for 1 day.    Dispense:  1 packet    Refill:  0

## 2020-08-19 NOTE — Telephone Encounter (Signed)
-----   Message from Consuelo Pandy, [place] sent at 08/08/2020  5:50 PM EDT ----- K mildly elevated. We will not be able to start Losartan. Make sure he is no longer taking Spironolactone. Needs to reduce dietary intake of potassium rich foods. Send Rx for Lokelma 5 mg x 1 to pharmacy.

## 2020-08-21 ENCOUNTER — Telehealth (HOSPITAL_COMMUNITY): Payer: Self-pay | Admitting: *Deleted

## 2020-08-21 ENCOUNTER — Other Ambulatory Visit (HOSPITAL_COMMUNITY): Payer: Self-pay

## 2020-08-21 ENCOUNTER — Telehealth (HOSPITAL_COMMUNITY): Payer: Self-pay

## 2020-08-21 NOTE — Telephone Encounter (Signed)
Wyatt Donovan's drug left vm stating they do not carry lokelma and asked for a different script to be sent in. I spoke with Wyatt Donovan,CMA that originally sent in the [place]. Wyatt said she would follow up and offer patient samples.

## 2020-08-21 NOTE — Telephone Encounter (Signed)
Lmtrc, pharmacy does not have lokelma in stock, wanted to know if patient would be able to come to the office to pick up some

## 2020-08-22 ENCOUNTER — Telehealth: Payer: Self-pay | Admitting: Licensed Clinical Social Worker

## 2020-08-22 ENCOUNTER — Other Ambulatory Visit (HOSPITAL_COMMUNITY): Payer: Self-pay | Admitting: Cardiology

## 2020-08-22 NOTE — Telephone Encounter (Signed)
LCSW received message from Rondell Reams) Edwinna Areola, RN, w/ [facility]. Pt uninsured, has been approved for [facility] and needs atorvastatin and carvedilol sent to their mail pharmacy "[facility]". Request sent to Chantel, [place]. Appreciate her assistance w/ moving medications to appropriate pharmacy. I let Trish know to f/u if any additional needs moving forward.   Westley Hummer, MSW, [place]  [phone]

## 2020-08-26 ENCOUNTER — Telehealth: Payer: Self-pay

## 2020-08-26 NOTE — Telephone Encounter (Signed)
Called to follow up with client regarding picking up atorvastatin and Carvedilol at [facility]. Client had refills there with no charge. He was to attempt to get transportation to get those. Client reports today that his father should be able to take him tomorrow morning. Informed client that I notified his cardiology team as well as [facility] (PCP) that he is now eligible for [facility] until next year. For medications that they have in stock.  Client states he will get medications tomorrow and if he cannot he will notify this RN. Client offered a [facility]$ [employer] giftcard to help with gas cost from [facility]. He will stop by office to obtain.  Plan: follow up 08/27/20 by noon to determine if client has his medications. Client is agreeable.  Debria Garret RN Clara Gunn/[facility]

## 2020-08-27 ENCOUNTER — Other Ambulatory Visit (HOSPITAL_COMMUNITY): Payer: Self-pay

## 2020-08-27 MED ORDER — ATORVASTATIN CALCIUM 80 MG PO TABS: 80.0000 mg | ORAL_TABLET | Freq: Every day | ORAL | 2 refills | Status: DC

## 2020-08-27 MED ORDER — CARVEDILOL 6.25 MG PO TABS: 6.2500 mg | ORAL_TABLET | Freq: Two times a day (BID) | ORAL | 2 refills | Status: DC

## 2020-09-04 NOTE — Progress Notes (Signed)
Advanced Heart Failure Clinic Note   Referring Physician: PCP: Neale Burly, MD PCP-Cardiologist: Sanda Klein, MD  AHFC: [name]   Reason for Visit: F/u for Systolic Heart Failure  HPI:  Wyatt Donovan is a 35 year old with an extensive history of coronary disease with his 1st MI at that age of 7,  Had PCI LAD in 2012,  hyperlipidemia, and tobacco abuse. ECHO has been 40-45% for several years.    He has been lost to cardiology follow-up due to lack of insurance. He was last seen in the emergency department in February 2021 with recurrence of chest pain and was started back on medical therapy with Toprol and Imdur.   Saw Dr Candis Musa in April 2022. Prior to that he had been out of meds for at least 12 months due to cost.     Presented to ED on 4/30/ 22 with chest pain--> NSTEMI. HS Troponin >25,000. Transferred to [facility] for work up. Had cath with 3 vessel coronary disease. CT surgery consulted. CMRI showed acceptable viability for CABG. On 07/03/20 he underwent CABGx4. Post op course complicated by GI bleed. GI consulted but he declined scope and planned to follow up as an outpatient. Started on farxiga, spironolactone, and carvedilol.   Had post hospital f/u on 07/16/20 and was doing ok. NYHA Class II-III.  Volume status was stable. ReDs Clip 30%. BP was too soft for med titration. BMP showed elevated K at 5.9 .Arlyce Harman was discontinued. Repeat labs showed normal K at 4.9.   Returns to clinic 6/22 for f/u. Doing fairly well. Wt stable, down 1 lb from prior visit. BP soft at 98/52 but no orthostatic symptoms. Walking ~15 min a day w/o having to stop. Reports full med compliance.   Today he returns for HF follow up. Overall feeling fine. SOB and fatigue with walking on flat ground after 5 minutes. Gets dizzy if stands too fast. Denies increasing SOB, CP, dizziness, edema, or PND/Orthopnea. Appetite ok. No fever or chills. Weight at home 165 pounds. Taking all medications.   Review of  systems complete and found to be negative unless listed in HPI.    Past Medical History:  Diagnosis Date   Atypical chest pain    CAD (coronary artery disease)    Dyslipidemia    Ex-smoker    History of depression    Insomnia    Ischemic cardiomyopathy    Ejection fraction 40-45%   NSTEMI (non-ST elevated myocardial infarction) ([place]) 08/2007   Treated with a bare metal stent to the proximal LAD   Suicide attempt ([place]) 01/2001    Current Outpatient Medications  Medication Sig Dispense Refill   aspirin EC 81 MG tablet Take 81 mg by mouth daily. Swallow whole.     atorvastatin (LIPITOR) 80 MG tablet Take 1 tablet (80 mg total) by mouth daily. 30 tablet 2   carvedilol (COREG) 6.25 MG tablet Take 1 tablet (6.25 mg total) by mouth 2 (two) times daily with a meal. 60 tablet 2   dapagliflozin propanediol (FARXIGA) 10 MG TABS tablet Take 1 tablet (10 mg total) by mouth daily before breakfast. 30 tablet 2   nitroGLYCERIN (NITROSTAT) 0.4 MG SL tablet Place 0.4 mg under the tongue every 5 (five) minutes as needed for chest pain.     No current facility-administered medications for this encounter.   Allergies  Allergen Reactions   Codeine Itching   Social History   Socioeconomic History   Marital status: Single  Spouse name: Not on file   Number of children: Not on file   Years of education: Not on file   Highest education level: High school graduate  Occupational History   Occupation: Not actively working    Comment: applied for disability x 2.   Tobacco Use   Smoking status: Former    Packs/day: 1.00    Years: 22.00    Pack years: 22.00    Types: Cigarettes, Cigars    Quit date: 06/28/2020    Years since quitting: 0.1   Smokeless tobacco: Never   Tobacco comments:    cigarettes stopped in 2020, swisher sweets started in 2018-04-05/day  Vaping Use   Vaping Use: Never used  Substance and Sexual Activity   Alcohol use: Never   Drug use: Not Currently    Types: Marijuana    Sexual activity: Never  Other Topics Concern   Not on file  Social History Narrative   Single   Lives with his parents   Trying to get his GED   DeGent date all cultures   Social Determinants of Health   Financial Resource Strain: High Risk   Difficulty of Paying Living Expenses: Hard  Food Insecurity: No Food Insecurity   Worried About Charity fundraiser in the Last Year: Never true   Ran Out of Food in the Last Year: Never true  Transportation Needs: No Transportation Needs   Lack of Transportation (Medical): No   Lack of Transportation (Non-Medical): No  Physical Activity: Not on file  Stress: Not on file  Social Connections: Not on file  Intimate Partner Violence: Not on file    Family History  Problem Relation Age of Onset   Hypertension Neg Hx    Diabetes Neg Hx    Coronary artery disease Neg Hx    BP 102/76   Pulse 82   Wt 73.6 kg (162 lb 3.2 oz)   SpO2 99%   BMI 21.40 kg/m   Wt Readings from Last 3 Encounters:  09/05/20 73.6 kg (162 lb 3.2 oz)  08/09/20 73.9 kg (163 lb)  08/08/20 74.3 kg (163 lb 12.8 oz)   PHYSICAL EXAM: General:  NAD. No resp difficulty, chronically-ill appearing, using cane. HEENT: Normal Neck: Supple. No JVD. Carotids 2+ bilat; no bruits. No lymphadenopathy or thryomegaly appreciated. Cor: PMI nondisplaced. Regular rate & rhythm. No rubs, gallops or murmurs. Lungs: Clear Abdomen: Soft, nontender, nondistended. No hepatosplenomegaly. No bruits or masses. Good bowel sounds. Extremities: No cyanosis, clubbing, rash, edema Neuro: Alert & oriented x 3, cranial nerves grossly intact. Moves all 4 extremities w/o difficulty. Affect pleasant.  ECG: SR 78 bpm (personally reviewed).  ASSESSMENT & PLAN:  ASSESSMENT & PLAN: 1.Chronic Systolic Heart failure , ICM - Echo 06/29/20 EF 40-45%  - NYHA II-III. Volume status stable.  - Continue Farxiga 10 mg daily. - Continue Coreg 6.25 mg bid. - Unable to start losartan last visit due to  hyperkalemia. Arlyce Harman discontinued due to hyperkalemia. - Discussed low sodium diet + daily wts.  - Check BMET. If K stable, will trial low dose losartan 12.5 mg qhs and plan repeat BMET in 1 week (BP too soft for ARNi)   2. S/P CABG x4 - Stable w/o CP.  - c/w ASA + statin + ? blocker. - Start CR if can get assistance. Medicaid pending.    3. Hyperlipidemia - LDL 4/22 was 83 mg/dL. Restarted on high intensity statin--> atorvastatin 80 mg daily - Repeat LP (6/22) ok.  4. Tobacco Abuse - Has not smoked since most recent admit. Need to avoid restarting.   Has F/u w/ [name] scheduled for next month.   [place], FNP 09/05/20

## 2020-09-05 ENCOUNTER — Ambulatory Visit (HOSPITAL_COMMUNITY)
Admission: RE | Admit: 2020-09-05 | Discharge: 2020-09-05 | Disposition: A | Discharge status: Home / Self Care | Payer: Medicaid Other | Source: Ambulatory Visit | Attending: Family Medicine | Admitting: Family Medicine

## 2020-09-05 ENCOUNTER — Encounter (HOSPITAL_COMMUNITY): Payer: Self-pay

## 2020-09-05 ENCOUNTER — Other Ambulatory Visit: Payer: Self-pay

## 2020-09-05 VITALS — BP 102/76 | HR 82 | Wt 162.2 lb

## 2020-09-05 DIAGNOSIS — Z7984 Long term (current) use of oral hypoglycemic drugs: Secondary | ICD-10-CM | POA: Insufficient documentation

## 2020-09-05 DIAGNOSIS — I5022 Chronic systolic (congestive) heart failure: Secondary | ICD-10-CM

## 2020-09-05 DIAGNOSIS — Z951 Presence of aortocoronary bypass graft: Secondary | ICD-10-CM

## 2020-09-05 DIAGNOSIS — Z79899 Other long term (current) drug therapy: Secondary | ICD-10-CM | POA: Insufficient documentation

## 2020-09-05 DIAGNOSIS — I252 Old myocardial infarction: Secondary | ICD-10-CM | POA: Diagnosis not present

## 2020-09-05 DIAGNOSIS — Z885 Allergy status to narcotic agent status: Secondary | ICD-10-CM | POA: Diagnosis not present

## 2020-09-05 DIAGNOSIS — Z7982 Long term (current) use of aspirin: Secondary | ICD-10-CM | POA: Diagnosis not present

## 2020-09-05 DIAGNOSIS — E785 Hyperlipidemia, unspecified: Secondary | ICD-10-CM | POA: Diagnosis not present

## 2020-09-05 DIAGNOSIS — Z87891 Personal history of nicotine dependence: Secondary | ICD-10-CM | POA: Diagnosis not present

## 2020-09-05 DIAGNOSIS — I25119 Atherosclerotic heart disease of native coronary artery with unspecified angina pectoris: Secondary | ICD-10-CM

## 2020-09-05 DIAGNOSIS — E7849 Other hyperlipidemia: Secondary | ICD-10-CM

## 2020-09-05 DIAGNOSIS — Z720 Tobacco use: Secondary | ICD-10-CM

## 2020-09-05 LAB — BASIC METABOLIC PANEL
Anion gap: 6 (ref 5–15)
BUN: 13 mg/dL (ref 6–20)
CO2: 27 mmol/L (ref 22–32)
Calcium: 9.5 mg/dL (ref 8.9–10.3)
Chloride: 109 mmol/L (ref 98–111)
Creatinine, Ser: 0.85 mg/dL (ref 0.61–1.24)
GFR, Estimated: >60 mL/min (ref >60)
Glucose, Bld: 85 mg/dL (ref 70–99)
Potassium: 5 mmol/L (ref 3.5–5.1)
Sodium: 142 mmol/L (ref 135–145)

## 2020-09-05 NOTE — Patient Instructions (Signed)
Routine lab work today. Will notify you of abnormal results  Keep follow up appointment  Do the following things EVERYDAY: Weigh yourself in the morning before breakfast. Write it down and keep it in a log. Take your medicines as prescribed Eat low salt foods--Limit salt (sodium) to 2000 mg per day.  Stay as active as you can everyday Limit all fluids for the day to less than 2 liters

## 2020-09-06 ENCOUNTER — Other Ambulatory Visit (HOSPITAL_COMMUNITY): Payer: Self-pay | Admitting: *Deleted

## 2020-09-06 ENCOUNTER — Telehealth (HOSPITAL_COMMUNITY): Payer: Self-pay | Admitting: *Deleted

## 2020-09-06 MED ORDER — LOSARTAN POTASSIUM 25 MG PO TABS: 12.5000 mg | ORAL_TABLET | Freq: Every day | ORAL | 3 refills | Status: DC

## 2020-09-06 NOTE — Telephone Encounter (Signed)
-----   Message from Rafael Bihari, [place] sent at 09/05/2020  1:22 PM EDT ----- Please have him start losartan 12.5 mg q HS. Will need repeat BMET 7-10 days. thanks

## 2020-09-06 NOTE — Telephone Encounter (Signed)
Harvie Junior, [place]  09/06/2020  9:06 AM EDT Back to Top     Pt aware and agreeable with plan. Order for lab work mailed to patients home.Pt will have lab work drawn closer to his home and results faxed to our office.    [place], FNP  09/05/2020  1:22 PM EDT      Please have him start losartan 12.5 mg q HS. Will need repeat BMET 7-10 days.thanks

## 2020-09-16 ENCOUNTER — Other Ambulatory Visit (HOSPITAL_COMMUNITY)
Admission: RE | Admit: 2020-09-16 | Discharge: 2020-09-16 | Disposition: A | Discharge status: Home / Self Care | Payer: Medicaid Other | Source: Ambulatory Visit | Attending: Cardiology | Admitting: Cardiology

## 2020-09-16 DIAGNOSIS — I5022 Chronic systolic (congestive) heart failure: Secondary | ICD-10-CM | POA: Insufficient documentation

## 2020-09-16 LAB — BASIC METABOLIC PANEL
Anion gap: 8 (ref 5–15)
BUN: 17 mg/dL (ref 6–20)
CO2: 26 mmol/L (ref 22–32)
Calcium: 9.3 mg/dL (ref 8.9–10.3)
Chloride: 105 mmol/L (ref 98–111)
Creatinine, Ser: 0.93 mg/dL (ref 0.61–1.24)
GFR, Estimated: >60 mL/min (ref >60)
Glucose, Bld: 103 mg/dL — ABNORMAL HIGH (ref 70–99)
Potassium: 3.8 mmol/L (ref 3.5–5.1)
Sodium: 139 mmol/L (ref 135–145)

## 2020-09-25 ENCOUNTER — Telehealth: Payer: Self-pay

## 2020-09-25 NOTE — Telephone Encounter (Signed)
Called for follow up. No answer, left voicemail.  [employer]

## 2020-10-04 ENCOUNTER — Ambulatory Visit (HOSPITAL_COMMUNITY)
Admission: RE | Admit: 2020-10-04 | Discharge: 2020-10-04 | Disposition: A | Discharge status: Home / Self Care | Payer: Medicaid Other | Source: Ambulatory Visit | Attending: Cardiology | Admitting: Cardiology

## 2020-10-04 ENCOUNTER — Encounter (HOSPITAL_COMMUNITY): Payer: Self-pay | Admitting: Cardiology

## 2020-10-04 ENCOUNTER — Other Ambulatory Visit: Payer: Self-pay

## 2020-10-04 VITALS — BP 86/50 | HR 71 | Wt 165.2 lb

## 2020-10-04 DIAGNOSIS — I5022 Chronic systolic (congestive) heart failure: Secondary | ICD-10-CM | POA: Diagnosis not present

## 2020-10-04 DIAGNOSIS — Z7984 Long term (current) use of oral hypoglycemic drugs: Secondary | ICD-10-CM | POA: Diagnosis not present

## 2020-10-04 DIAGNOSIS — I25118 Atherosclerotic heart disease of native coronary artery with other forms of angina pectoris: Secondary | ICD-10-CM | POA: Diagnosis not present

## 2020-10-04 DIAGNOSIS — K922 Gastrointestinal hemorrhage, unspecified: Secondary | ICD-10-CM | POA: Diagnosis not present

## 2020-10-04 DIAGNOSIS — I2510 Atherosclerotic heart disease of native coronary artery without angina pectoris: Secondary | ICD-10-CM | POA: Diagnosis not present

## 2020-10-04 DIAGNOSIS — I255 Ischemic cardiomyopathy: Secondary | ICD-10-CM | POA: Diagnosis not present

## 2020-10-04 DIAGNOSIS — Z87891 Personal history of nicotine dependence: Secondary | ICD-10-CM | POA: Insufficient documentation

## 2020-10-04 DIAGNOSIS — Z8249 Family history of ischemic heart disease and other diseases of the circulatory system: Secondary | ICD-10-CM | POA: Insufficient documentation

## 2020-10-04 DIAGNOSIS — Z7982 Long term (current) use of aspirin: Secondary | ICD-10-CM | POA: Diagnosis not present

## 2020-10-04 DIAGNOSIS — Z951 Presence of aortocoronary bypass graft: Secondary | ICD-10-CM | POA: Insufficient documentation

## 2020-10-04 DIAGNOSIS — I959 Hypotension, unspecified: Secondary | ICD-10-CM | POA: Diagnosis not present

## 2020-10-04 DIAGNOSIS — Z79899 Other long term (current) drug therapy: Secondary | ICD-10-CM | POA: Diagnosis not present

## 2020-10-04 DIAGNOSIS — R42 Dizziness and giddiness: Secondary | ICD-10-CM | POA: Insufficient documentation

## 2020-10-04 LAB — BASIC METABOLIC PANEL
Anion gap: 8 (ref 5–15)
BUN: 14 mg/dL (ref 6–20)
CO2: 23 mmol/L (ref 22–32)
Calcium: 9.5 mg/dL (ref 8.9–10.3)
Chloride: 109 mmol/L (ref 98–111)
Creatinine, Ser: 0.76 mg/dL (ref 0.61–1.24)
GFR, Estimated: >60 mL/min (ref >60)
Glucose, Bld: 88 mg/dL (ref 70–99)
Potassium: 4.5 mmol/L (ref 3.5–5.1)
Sodium: 140 mmol/L (ref 135–145)

## 2020-10-04 LAB — CBC
HCT: 47.2 % (ref 39.0–52.0)
Hemoglobin: 14.3 g/dL (ref 13.0–17.0)
MCH: 27.4 pg (ref 26.0–34.0)
MCHC: 30.3 g/dL (ref 30.0–36.0)
MCV: 90.4 fL (ref 80.0–100.0)
Platelets: 279 10*3/uL (ref 150–400)
RBC: 5.22 MIL/uL (ref 4.22–5.81)
RDW: 16.4 % — ABNORMAL HIGH (ref 11.5–15.5)
WBC: 6.7 10*3/uL (ref 4.0–10.5)
nRBC: 0 % (ref 0.0–0.2)

## 2020-10-04 LAB — BRAIN NATRIURETIC PEPTIDE: B Natriuretic Peptide: 197.9 pg/mL — ABNORMAL HIGH (ref 0.0–100.0)

## 2020-10-04 MED ORDER — CARVEDILOL 3.125 MG PO TABS: 3.1250 mg | ORAL_TABLET | Freq: Two times a day (BID) | ORAL | 3 refills | Status: DC

## 2020-10-04 NOTE — Patient Instructions (Addendum)
EKG done today.  Labs done today. We will contact you only if your labs are abnormal.  DECREASE Carvedilol to 3.'125mg'$  (1 tablet) by mouth 2 times daily.   No other medication changes were made. Please continue all current medications as prescribed.  Your physician recommends that you schedule a follow-up appointment soon for an echo and in 1 month with our [facility] here in our office   If you have any questions or concerns before your next appointment please send [place] a message through [place] or call our office at [phone].    TO LEAVE A MESSAGE FOR THE NURSE SELECT OPTION 2, PLEASE LEAVE A MESSAGE INCLUDING: YOUR NAME DATE OF BIRTH CALL BACK NUMBER REASON FOR CALL**this is important as we prioritize the call backs  YOU WILL RECEIVE A CALL BACK THE SAME DAY AS LONG AS YOU CALL BEFORE 4:00 PM   Do the following things EVERYDAY: Weigh yourself in the morning before breakfast. Write it down and keep it in a log. Take your medicines as prescribed Eat low salt foods--Limit salt (sodium) to 2000 mg per day.  Stay as active as you can everyday Limit all fluids for the day to less than 2 liters   At the [facility], you and your health needs are our priority. As part of our continuing mission to provide you with exceptional heart care, we have created designated Provider Care Teams. These Care Teams include your primary Cardiologist (physician) and Advanced Practice Providers (APPs- Physician Assistants and Nurse Practitioners) who all work together to provide you with the care you need, when you need it.   You may see any of the following providers on your designated Care Team at your next follow up: Dr Glori Bickers Dr Haynes Kerns, NP Lyda Jester, [place] Audry Riles, PharmD   Please be sure to bring in all your medications bottles to every appointment.

## 2020-10-04 NOTE — Progress Notes (Signed)
ReDS Vest / Clip - 10/04/20 1200       ReDS Vest / Clip   Station Marker C    Ruler Value 28    ReDS Value Range Low volume    ReDS Actual Value 30

## 2020-10-04 NOTE — Progress Notes (Signed)
PCP: Neale Burly, MD Cardiology: [name]  35 y.o. with history of early onset CAD s/p CABG, ischemic cardiomyopathy, and LV thrombus presents for followup of CHF and CAD.  Patient had initial MI in 2012 at age 36, PCI to LAD.  He had inferoposterior MI in 4/22.  LHC showed 3 vessel disease, and patient had CABG x 4. Cardiac MRI in 5/22 showed LV EF 27% with LV thrombus, there was significant viability.  Post-op, patient had GI bleeding and anticoagulation was stopped.  He quit smoking after CABG.   Patient has had trouble starting GDMT due to low BP and lightheadedness.  He has been using his cane due to lightheadedness when he first stands up.  No chest pain.  He is short of breath with longer walks and other forms of moderate exertion.  No orthopnea/PND.  No BRBPR/melena.     REDS clip 30%  Labs (6/22): LDL 39 Labs (7/22): K 3.8, creatinine 0.93  ECG (personally reviewed): NSR, inferior Qs (narrow QRS)  PMH: 1. Hyperlipidemia 2. CAD: MI at age 24 in 2012, PCI to LAD.   - Inferoposterior MI in 4/22: LHC showed 3 vessel disease.  CABG with LIMA-LAD, radial to OM1, sequential SVG-PDA and D1.  3. Post-operative GI bleeding after CABG 4. Chronic systolic CHF: Ischemic cardiomyopathy.   - Cardiac MRI (5/22): LV EF 27%, RV EF 57%, apical thrombus, extensive viability.  5. LV thrombus   Social History   Socioeconomic History   Marital status: Single    Spouse name: Not on file   Number of children: Not on file   Years of education: Not on file   Highest education level: High school graduate  Occupational History   Occupation: Not actively working    Comment: applied for disability x 2.   Tobacco Use   Smoking status: Former    Packs/day: 1.00    Years: 22.00    Pack years: 22.00    Types: Cigarettes, Cigars    Quit date: 06/28/2020    Years since quitting: 0.2   Smokeless tobacco: Never   Tobacco comments:    cigarettes stopped in 2020, swisher sweets started in  2018-04-05/day  Vaping Use   Vaping Use: Never used  Substance and Sexual Activity   Alcohol use: Never   Drug use: Not Currently    Types: Marijuana   Sexual activity: Never  Other Topics Concern   Not on file  Social History Narrative   Single   Lives with his parents   Trying to get his GED   DeGent date all cultures   Social Determinants of Health   Financial Resource Strain: High Risk   Difficulty of Paying Living Expenses: Hard  Food Insecurity: No Food Insecurity   Worried About Charity fundraiser in the Last Year: Never true   Ran Out of Food in the Last Year: Never true  Transportation Needs: No Transportation Needs   Lack of Transportation (Medical): No   Lack of Transportation (Non-Medical): No  Physical Activity: Not on file  Stress: Not on file  Social Connections: Not on file  Intimate Partner Violence: Not on file   Family History  Problem Relation Age of Onset   Hypertension Neg Hx    Diabetes Neg Hx    Coronary artery disease Neg Hx    ROS: All systems reviewed and negative except as per HPI.   Current Outpatient Medications  Medication Sig Dispense Refill   aspirin EC 81 MG  tablet Take 81 mg by mouth daily. Swallow whole.     atorvastatin (LIPITOR) 80 MG tablet Take 1 tablet (80 mg total) by mouth daily. 30 tablet 2   dapagliflozin propanediol (FARXIGA) 10 MG TABS tablet Take 1 tablet (10 mg total) by mouth daily before breakfast. 30 tablet 2   losartan (COZAAR) 25 MG tablet Take 0.5 tablets (12.5 mg total) by mouth at bedtime. 45 tablet 3   nitroGLYCERIN (NITROSTAT) 0.4 MG SL tablet Place 0.4 mg under the tongue every 5 (five) minutes as needed for chest pain.     carvedilol (COREG) 3.125 MG tablet Take 1 tablet (3.125 mg total) by mouth 2 (two) times daily with a meal. 180 tablet 3   No current facility-administered medications for this encounter.   BP (!) 86/50   Pulse 71   Wt 74.9 kg   SpO2 97%   BMI 21.80 kg/m  General: NAD Neck: No JVD,  no thyromegaly or thyroid nodule.  Lungs: Clear to auscultation bilaterally with normal respiratory effort. CV: Nondisplaced PMI.  Heart regular S1/S2, no S3/S4, no murmur.  No peripheral edema.  No carotid bruit.  Normal pedal pulses.  Abdomen: Soft, nontender, no hepatosplenomegaly, no distention.  Skin: Intact without lesions or rashes.  Neurologic: Alert and oriented x 3.  Psych: Normal affect. Extremities: No clubbing or cyanosis.  HEENT: Normal.   Assessment/Plan: 1. Chronic systolic CHF: Ischemic cardiomyopathy. Cardiac MRI in 5/22 with LV EF 27%.  There was significant viability, and he is now s/p CABG.  He has had trouble starting GDMT due to hypotension.  BP is low today, he is walking with a cane due to orthostatic symptoms.  - He will need to decrease Coreg to 3.125 mg bid.  - Continue dapagliflozin 10 mg daily.  - Continue losartan 12.5 mg qhs.   - BMET/BNP today.  - I will arrange for repeat echo to determine need for ICD.  He has a narrow QRS, would not qualify for CRT.  - Increase exercise, when he gets on Medicaid, would like him to do cardiac rehab at [facility].  2. CAD: Now s/p CABG in 5/22.  No chest pain.  He has quit smoking.  - Continue ASA 81 - Continue atorvastatin, good lipids in 6/22.  3. LV thrombus: Noted during last admission.  Anticoagulation was stopped after GI bleed.  - I will arrange for echo with [facility] as above, if thrombus is noted, he will need to start on warfarin.  4. H/o GI bleeding: No overt bleeding.  - CBC today.   Followup in 1 month with APP for attempt at med titration (?if BP will tolerate spironolactone at low dose in future).  I will have our social worker check into his Medicaid application.   Loralie Champagne 10/04/2020

## 2020-10-04 NOTE — Progress Notes (Signed)
CSW met with patient to answer questions regarding no insurance. Patient is a 35 yo male who resides with his father. He reports he has been denied social security disability in the past but has since had a heart attack. Patient currently has a pending medicaid and disability review at [employer] in [place]. Patient reports he had an appointment a few days ago for disability but no sure who he met with. CSW encouraged patient to get a log of all appointments and contacts for his disability claim to follow up if needed. Patient verbalize understanding and will contact CSW if further needs arise. Patient appears to be in process for disability and medicaid review.  CSW available as needed. Wyatt Donovan, [place], [place]

## 2020-10-15 ENCOUNTER — Ambulatory Visit (HOSPITAL_COMMUNITY)
Admission: RE | Admit: 2020-10-15 | Discharge: 2020-10-15 | Disposition: A | Discharge status: Home / Self Care | Payer: Medicaid Other | Source: Ambulatory Visit | Attending: Cardiology | Admitting: Cardiology

## 2020-10-15 ENCOUNTER — Other Ambulatory Visit: Payer: Self-pay

## 2020-10-15 DIAGNOSIS — I509 Heart failure, unspecified: Secondary | ICD-10-CM | POA: Insufficient documentation

## 2020-10-15 DIAGNOSIS — I5032 Chronic diastolic (congestive) heart failure: Secondary | ICD-10-CM

## 2020-10-15 DIAGNOSIS — Z951 Presence of aortocoronary bypass graft: Secondary | ICD-10-CM | POA: Diagnosis not present

## 2020-10-15 DIAGNOSIS — I340 Nonrheumatic mitral (valve) insufficiency: Secondary | ICD-10-CM | POA: Diagnosis not present

## 2020-10-15 DIAGNOSIS — I25118 Atherosclerotic heart disease of native coronary artery with other forms of angina pectoris: Secondary | ICD-10-CM

## 2020-10-15 LAB — ECHOCARDIOGRAM COMPLETE
AR max vel: 3.41 cm2
AV Area VTI: 3.05 cm2
AV Area mean vel: 3.16 cm2
AV Mean grad: 2 mmHg
AV Peak grad: 3.2 mmHg
Ao pk vel: 0.89 m/s
Area-P 1/2: 4.49 cm2
Calc EF: 41.8 %
S' Lateral: 4.9 cm
Single Plane A2C EF: 53 %
Single Plane A4C EF: 31.9 %

## 2020-10-15 MED ORDER — PERFLUTREN LIPID MICROSPHERE
1.0000 mL | INTRAVENOUS | Status: AC | PRN
  Administered 2020-10-15: 2 mL via INTRAVENOUS
  Filled 2020-10-15: qty 10

## 2020-10-15 NOTE — Progress Notes (Signed)
  Echocardiogram 2D Echocardiogram with contrast has been performed.  Merrie Roof F 10/15/2020, 12:17 PM

## 2020-10-28 ENCOUNTER — Other Ambulatory Visit (HOSPITAL_COMMUNITY): Payer: Self-pay

## 2020-11-01 NOTE — Progress Notes (Signed)
PCP: Neale Burly, MD Cardiology: [name]  35 y.o. with history of early onset CAD s/p CABG, ischemic cardiomyopathy, and LV thrombus presents for followup of CHF and CAD.  Patient had initial MI in 2012 at age 45, PCI to LAD.  He had inferoposterior MI in 4/22.  LHC showed 3 vessel disease, and patient had CABG x 4. Cardiac MRI in 5/22 showed LV EF 27% with LV thrombus, there was significant viability.  Post-op, patient had GI bleeding and anticoagulation was stopped.  He quit smoking after CABG.   Follow up 10/04/20 carvedilol decreased due to dizziness and repeat echo arranged.  Echo 8/22 EF 25-30%. He was referred to EP for ICD consideration.   Today he returns for HF follow up. Dizziness resolved since decreasing carvedilol last visit. He is walking 10 minutes a day at home to keep up his strength, some dyspnea and fatigue with longer walks. Continues to use a cane for balance. Denies CP, edema, or PND/Orthopnea. Appetite ok. No fever or chills. Weight at home 170 pounds. Taking all medications.   Labs (6/22): LDL 39 Labs (7/22): K 3.8, creatinine 0.93 Labs (8/22): K 4.5, creatinine 0.76, Hgb 14.3  ECG (personally reviewed): none ordered today.  PMH: 1. Hyperlipidemia 2. CAD: MI at age 38 in 2012, PCI to LAD.   - Inferoposterior MI in 4/22: LHC showed 3 vessel disease.  CABG with LIMA-LAD, radial to OM1, sequential SVG-PDA and D1.  3. Post-operative GI bleeding after CABG 4. Chronic systolic CHF: Ischemic cardiomyopathy.   - Cardiac MRI (5/22): LV EF 27%, RV EF 57%, apical thrombus, extensive viability.  - Echo (8/22) EF 20-25% 5. LV thrombus  - No thrombus on echo 8/22.  Social History   Socioeconomic History   Marital status: Single    Spouse name: Not on file   Number of children: Not on file   Years of education: Not on file   Highest education level: High school graduate  Occupational History   Occupation: Not actively working    Comment: applied for disability x  2.   Tobacco Use   Smoking status: Former    Packs/day: 1.00    Years: 22.00    Pack years: 22.00    Types: Cigarettes, Cigars    Quit date: 06/28/2020    Years since quitting: 0.3   Smokeless tobacco: Never   Tobacco comments:    cigarettes stopped in 2020, swisher sweets started in 2018-04-05/day  Vaping Use   Vaping Use: Never used  Substance and Sexual Activity   Alcohol use: Never   Drug use: Not Currently    Types: Marijuana   Sexual activity: Never  Other Topics Concern   Not on file  Social History Narrative   Single   Lives with his parents   Trying to get his GED   DeGent date all cultures   Social Determinants of Health   Financial Resource Strain: High Risk   Difficulty of Paying Living Expenses: Hard  Food Insecurity: No Food Insecurity   Worried About Charity fundraiser in the Last Year: Never true   Ran Out of Food in the Last Year: Never true  Transportation Needs: No Transportation Needs   Lack of Transportation (Medical): No   Lack of Transportation (Non-Medical): No  Physical Activity: Not on file  Stress: Not on file  Social Connections: Not on file  Intimate Partner Violence: Not on file   Family History  Problem Relation Age of Onset  Hypertension Neg Hx    Diabetes Neg Hx    Coronary artery disease Neg Hx    ROS: All systems reviewed and negative except as per HPI.   Current Outpatient Medications  Medication Sig Dispense Refill   aspirin EC 81 MG tablet Take 81 mg by mouth daily. Swallow whole.     atorvastatin (LIPITOR) 80 MG tablet Take 1 tablet (80 mg total) by mouth daily. 30 tablet 2   carvedilol (COREG) 3.125 MG tablet Take 1 tablet (3.125 mg total) by mouth 2 (two) times daily with a meal. 180 tablet 3   dapagliflozin propanediol (FARXIGA) 10 MG TABS tablet Take 1 tablet (10 mg total) by mouth daily before breakfast. 30 tablet 2   losartan (COZAAR) 25 MG tablet Take 0.5 tablets (12.5 mg total) by mouth at bedtime. 45 tablet 3    nitroGLYCERIN (NITROSTAT) 0.4 MG SL tablet Place 0.4 mg under the tongue every 5 (five) minutes as needed for chest pain.     spironolactone (ALDACTONE) 25 MG tablet Take 0.5 tablets (12.5 mg total) by mouth daily. 90 tablet 3   No current facility-administered medications for this encounter.   Wt Readings from Last 3 Encounters:  11/05/20 78.6 kg (173 lb 3.2 oz)  10/04/20 74.9 kg (165 lb 3.2 oz)  09/05/20 73.6 kg (162 lb 3.2 oz)   BP 110/90 (BP Location: Right Arm)   Pulse 81   Wt 78.6 kg (173 lb 3.2 oz)   SpO2 97%   BMI 22.85 kg/m   General:  NAD. No resp difficulty, chronically-ill appearing. HEENT: Normal Neck: Supple. No JVD. Carotids 2+ bilat; no bruits. No lymphadenopathy or thryomegaly appreciated. Cor: PMI nondisplaced. Regular rate & rhythm. No rubs, gallops or murmurs. Lungs: Clear Abdomen: Soft, nontender, nondistended. No hepatosplenomegaly. No bruits or masses. Good bowel sounds. Extremities: No cyanosis, clubbing, rash, edema Neuro: Alert & oriented x 3, cranial nerves grossly intact. Moves all 4 extremities w/o difficulty. Affect pleasant.  Assessment/Plan: 1. Chronic systolic CHF: Ischemic cardiomyopathy. Cardiac MRI in 5/22 with LV EF 27%.  There was significant viability, and he is now s/p CABG.  Repeat echo 8/22 EF 25-30%, he has been referred to EP for ICD consideration. He has had trouble starting GDMT due to hypotension.  BP is better today, he is walking with a cane now for balance.  - Start spironolactone 12.5 mg daily. BMET today, repeat in 7 days, 4 weeks, and 8 weeks. - Continue Coreg 3.125 mg bid.  - Continue dapagliflozin 10 mg daily.  - Continue losartan 12.5 mg qhs.   - He has been referred to EP for ICD consideration.  He has a narrow QRS, would not qualify for CRT.  - Continue to increase exercise, when he gets on Medicaid, would like him to do cardiac rehab at [facility].  2. CAD: Now s/p CABG in 5/22.  No chest pain.  He has quit smoking.  -  Continue ASA 81 - Continue atorvastatin, good lipids in 6/22.  3. LV thrombus: Noted during last admission.  Anticoagulation was stopped after GI bleed.  - No LV thrombus noted on repeat echo (8/22). 4. H/o GI bleeding: No overt bleeding. Recent Hgb stable.  Followup in 1 month with PharmD for further medication titration (consider increasing losartan or spiro if able), and with APP in 6-8 weeks.  [place] FNP 11/05/2020

## 2020-11-05 ENCOUNTER — Encounter (HOSPITAL_COMMUNITY): Payer: Self-pay

## 2020-11-05 ENCOUNTER — Ambulatory Visit (HOSPITAL_COMMUNITY)
Admission: RE | Admit: 2020-11-05 | Discharge: 2020-11-05 | Disposition: A | Discharge status: Home / Self Care | Payer: Medicaid Other | Source: Ambulatory Visit | Attending: Family Medicine | Admitting: Family Medicine

## 2020-11-05 ENCOUNTER — Other Ambulatory Visit: Payer: Self-pay

## 2020-11-05 VITALS — BP 110/90 | HR 81 | Wt 173.2 lb

## 2020-11-05 DIAGNOSIS — I25119 Atherosclerotic heart disease of native coronary artery with unspecified angina pectoris: Secondary | ICD-10-CM

## 2020-11-05 DIAGNOSIS — Z7984 Long term (current) use of oral hypoglycemic drugs: Secondary | ICD-10-CM | POA: Diagnosis not present

## 2020-11-05 DIAGNOSIS — Z7901 Long term (current) use of anticoagulants: Secondary | ICD-10-CM | POA: Insufficient documentation

## 2020-11-05 DIAGNOSIS — Z8719 Personal history of other diseases of the digestive system: Secondary | ICD-10-CM

## 2020-11-05 DIAGNOSIS — R0600 Dyspnea, unspecified: Secondary | ICD-10-CM | POA: Insufficient documentation

## 2020-11-05 DIAGNOSIS — Z79899 Other long term (current) drug therapy: Secondary | ICD-10-CM | POA: Diagnosis not present

## 2020-11-05 DIAGNOSIS — R5383 Other fatigue: Secondary | ICD-10-CM | POA: Diagnosis not present

## 2020-11-05 DIAGNOSIS — I2510 Atherosclerotic heart disease of native coronary artery without angina pectoris: Secondary | ICD-10-CM | POA: Diagnosis not present

## 2020-11-05 DIAGNOSIS — E782 Mixed hyperlipidemia: Secondary | ICD-10-CM

## 2020-11-05 DIAGNOSIS — Z87891 Personal history of nicotine dependence: Secondary | ICD-10-CM | POA: Insufficient documentation

## 2020-11-05 DIAGNOSIS — Z7182 Exercise counseling: Secondary | ICD-10-CM | POA: Diagnosis not present

## 2020-11-05 DIAGNOSIS — Z7982 Long term (current) use of aspirin: Secondary | ICD-10-CM | POA: Diagnosis not present

## 2020-11-05 DIAGNOSIS — I5022 Chronic systolic (congestive) heart failure: Secondary | ICD-10-CM

## 2020-11-05 DIAGNOSIS — I252 Old myocardial infarction: Secondary | ICD-10-CM | POA: Diagnosis not present

## 2020-11-05 DIAGNOSIS — Z951 Presence of aortocoronary bypass graft: Secondary | ICD-10-CM | POA: Diagnosis not present

## 2020-11-05 DIAGNOSIS — I255 Ischemic cardiomyopathy: Secondary | ICD-10-CM | POA: Insufficient documentation

## 2020-11-05 LAB — BASIC METABOLIC PANEL
Anion gap: 6 (ref 5–15)
BUN: 14 mg/dL (ref 6–20)
CO2: 25 mmol/L (ref 22–32)
Calcium: 9.1 mg/dL (ref 8.9–10.3)
Chloride: 108 mmol/L (ref 98–111)
Creatinine, Ser: 0.79 mg/dL (ref 0.61–1.24)
GFR, Estimated: >60 mL/min (ref >60)
Glucose, Bld: 90 mg/dL (ref 70–99)
Potassium: 4.1 mmol/L (ref 3.5–5.1)
Sodium: 139 mmol/L (ref 135–145)

## 2020-11-05 MED ORDER — SPIRONOLACTONE 25 MG PO TABS: 12.5000 mg | ORAL_TABLET | Freq: Every day | ORAL | 3 refills | Status: DC

## 2020-11-05 NOTE — Patient Instructions (Signed)
Labs were performed today, if any come back abnormal the clinic will call you  You will need labs  in 1 week, 4 weeks, and 8 weeks  START Spirolactone 12.5 mg daily  Your physician recommends that you schedule a follow-up appointment in: 6-8 weeks and  4 weeks with Pharmacy   You have been referred to [facility] they will call you to make a appointment  At the [facility], you and your health needs are our priority. As part of our continuing mission to provide you with exceptional heart care, we have created designated Provider Care Teams. These Care Teams include your primary Cardiologist (physician) and Advanced Practice Providers (APPs- Physician Assistants and Nurse Practitioners) who all work together to provide you with the care you need, when you need it.   You may see any of the following providers on your designated Care Team at your next follow up: Dr Glori Bickers Dr Loralie Champagne Dr Patrice Paradise, NP Lyda Jester, [place] Ginnie Smart Audry Riles, PharmD   Please be sure to bring in all your medications bottles to every appointment.    If you have any questions or concerns before your next appointment please send [place] a message through [place] or call our office at [phone].    TO LEAVE A MESSAGE FOR THE NURSE SELECT OPTION 2, PLEASE LEAVE A MESSAGE INCLUDING: YOUR NAME DATE OF BIRTH CALL BACK NUMBER REASON FOR CALL**this is important as we prioritize the call backs  YOU WILL RECEIVE A CALL BACK THE SAME DAY AS LONG AS YOU CALL BEFORE 4:00 PM

## 2020-11-06 ENCOUNTER — Other Ambulatory Visit (HOSPITAL_COMMUNITY): Payer: Self-pay | Admitting: *Deleted

## 2020-11-06 DIAGNOSIS — I5022 Chronic systolic (congestive) heart failure: Secondary | ICD-10-CM

## 2020-11-06 MED ORDER — SPIRONOLACTONE 25 MG PO TABS: 12.5000 mg | ORAL_TABLET | Freq: Every day | ORAL | 3 refills | Status: DC

## 2020-11-22 NOTE — Progress Notes (Signed)
PCP: Neale Burly, MD Cardiology: [name]  HPI:  35 y.o. with history of early onset CAD s/p CABG, ischemic cardiomyopathy, and LV thrombus presents for followup of CHF and CAD.  Patient had initial MI in 2012 at age 81, PCI to LAD.  He had inferoposterior MI in 05/2020.  LHC showed 3 vessel disease, and patient had CABG x 4. Cardiac MRI in 06/2020 showed LVEF 27% with LV thrombus, there was significant viability.  Post-op, patient had GI bleeding and anticoagulation was stopped.  He quit smoking after CABG.    Follow up 10/04/20 carvedilol decreased due to dizziness and repeat echo arranged.   Echo 8/22 EF 25-30%. He was referred to EP for ICD consideration.    Recently returned to HF Clinic for follow up 11/05/20. Dizziness had resolved since decreasing carvedilol last visit. He was walking 10 minutes a day at home to keep up his strength, some dyspnea and fatigue with longer walks. Continued to use a cane for balance. Denied CP, edema, or PND/Orthopnea. Appetite was ok. No fever or chills. Weight at home was 170 pounds. Taking all medications.   Today he returns to HF clinic for pharmacist medication titration. At last visit with APP, spironolactone 12.5 mg daily was initiated. Overall he is feeling poorly today. Notes chest pain that occurs at rest on and off for the past week. Had used 2 doses of SL NTG in that time, after which pain resolved. He presented to his PCP yesterday for chest pain (pain was 7/10). He received 2 doses of SL NTG in the office and was prescribed isosorbide mononitrate 10 mg daily, which he has not picked up. He then took one more dose of SL NTG at home for ongoing chest pain. Stated he "started pouring sweat and pain was radiating to his head". Still having chest pain today but notes pain is less (2/10). No dizziness or lightheadedness. Notes increased SOB with chest pain. When he is not having chest pain, can only walk 2-3 minutes before getting SOB. He tries to walk 10  minutes every day. Weight has been increasing over the past months, which he attributes to eating better and gaining muscle. Does not take any diuretic. No LEE, PND or orthopnea.     HF Medications: Carvedilol 3.125 mg BID Losartan 12.5 mg daily Spironolactone 12.5 mg daily Farxiga 10 mg daily  Has the patient been experiencing any side effects to the medications prescribed?  no  Does the patient have any problems obtaining medications due to transportation or finances?   No - now has [facility] Medicaid and [place] [employer]. Was approved for Az&me before he had Medicaid. Wilder Glade is currently being shipped to him and will arrive in 10-14 days. He only has 3 tablets left. Provided Farxiga samples in clinic today (Quantity: 14 tablets; Lot: [id number], Exp: 06/30/23)  Understanding of regimen: good Understanding of indications: good Potential of compliance: good Patient understands to avoid NSAIDs. Patient understands to avoid decongestants.    Pertinent Lab Values: 12/03/20: Serum creatinine 0.90, BUN 16, Potassium 4.0, Sodium 140, BNP 122.1, troponin 245  Vital Signs: Weight: 178.4 lbs (last clinic weight: 173.2 lbs) Blood pressure: 100/72  Heart rate: 78   Assessment/Plan: 1. Chronic systolic CHF: Ischemic cardiomyopathy. Cardiac MRI in 06/2020 with LV EF 27%.  There was significant viability, and he is now s/p CABG.  Repeat echo 09/2020 EF 25-30%, he has been referred to EP for ICD consideration. He has had trouble starting GDMT due to hypotension.   -  Patient is having ongoing chest pain at rest today. Discussed with [name]. Patient referred to the ED for further workup.  - Current heart failure regimen includes: carvedilol 3.125 mg BID, losartan 12.5 mg daily, spironolactone 12.5 mg daily, dapagliflozin 10 mg daily.  - He has been referred to EP for ICD consideration.  He has a narrow QRS, would not qualify for CRT.  2. CAD: Now s/p CABG in 06/2020. He has quit smoking.  - As above,  referred to ED for further workup of chest pain.  - Continue ASA 81 - Continue atorvastatin, good lipids in 07/2020.  3. LV thrombus: Noted during last admission.  Anticoagulation was stopped after GI bleed.  - No LV thrombus noted on repeat echo (09/2020). 4. H/o GI bleeding: No overt bleeding. Recent Hgb stable   Audry Riles, PharmD, BCPS, BCCP, CPP Heart Failure Clinic Pharmacist [phone]

## 2020-12-02 ENCOUNTER — Other Ambulatory Visit (HOSPITAL_COMMUNITY): Payer: Self-pay | Admitting: Internal Medicine

## 2020-12-03 ENCOUNTER — Encounter (HOSPITAL_COMMUNITY): Payer: Self-pay

## 2020-12-03 ENCOUNTER — Other Ambulatory Visit: Payer: Self-pay

## 2020-12-03 ENCOUNTER — Ambulatory Visit (HOSPITAL_BASED_OUTPATIENT_CLINIC_OR_DEPARTMENT_OTHER)
Admission: RE | Admit: 2020-12-03 | Discharge: 2020-12-03 | Disposition: A | Discharge status: Home / Self Care | Payer: Medicaid Other | Source: Ambulatory Visit | Attending: Cardiology | Admitting: Cardiology

## 2020-12-03 ENCOUNTER — Telehealth: Payer: Self-pay

## 2020-12-03 ENCOUNTER — Emergency Department (HOSPITAL_COMMUNITY): Payer: Medicaid Other

## 2020-12-03 ENCOUNTER — Inpatient Hospital Stay (HOSPITAL_COMMUNITY)
Admission: EM | Admit: 2020-12-03 | Discharge: 2020-12-06 | DRG: 247 | Disposition: A | Discharge status: Home / Self Care | Payer: Medicaid Other | Attending: Cardiology | Admitting: Cardiology

## 2020-12-03 ENCOUNTER — Encounter (HOSPITAL_COMMUNITY)
Admission: EM | Disposition: A | Discharge status: Home / Self Care | Payer: Self-pay | Source: Home / Self Care | Attending: Cardiology

## 2020-12-03 VITALS — BP 100/72 | HR 78 | Wt 178.4 lb

## 2020-12-03 DIAGNOSIS — Z885 Allergy status to narcotic agent status: Secondary | ICD-10-CM

## 2020-12-03 DIAGNOSIS — Z7982 Long term (current) use of aspirin: Secondary | ICD-10-CM | POA: Diagnosis not present

## 2020-12-03 DIAGNOSIS — Z09 Encounter for follow-up examination after completed treatment for conditions other than malignant neoplasm: Secondary | ICD-10-CM | POA: Insufficient documentation

## 2020-12-03 DIAGNOSIS — I252 Old myocardial infarction: Secondary | ICD-10-CM | POA: Diagnosis not present

## 2020-12-03 DIAGNOSIS — Z86718 Personal history of other venous thrombosis and embolism: Secondary | ICD-10-CM | POA: Insufficient documentation

## 2020-12-03 DIAGNOSIS — Z20822 Contact with and (suspected) exposure to covid-19: Secondary | ICD-10-CM | POA: Diagnosis present

## 2020-12-03 DIAGNOSIS — Z7901 Long term (current) use of anticoagulants: Secondary | ICD-10-CM | POA: Insufficient documentation

## 2020-12-03 DIAGNOSIS — E785 Hyperlipidemia, unspecified: Secondary | ICD-10-CM | POA: Diagnosis present

## 2020-12-03 DIAGNOSIS — I4892 Unspecified atrial flutter: Secondary | ICD-10-CM | POA: Diagnosis not present

## 2020-12-03 DIAGNOSIS — I255 Ischemic cardiomyopathy: Secondary | ICD-10-CM | POA: Insufficient documentation

## 2020-12-03 DIAGNOSIS — I5022 Chronic systolic (congestive) heart failure: Secondary | ICD-10-CM | POA: Insufficient documentation

## 2020-12-03 DIAGNOSIS — E875 Hyperkalemia: Secondary | ICD-10-CM | POA: Diagnosis not present

## 2020-12-03 DIAGNOSIS — R0602 Shortness of breath: Secondary | ICD-10-CM | POA: Insufficient documentation

## 2020-12-03 DIAGNOSIS — E861 Hypovolemia: Secondary | ICD-10-CM | POA: Diagnosis present

## 2020-12-03 DIAGNOSIS — Z23 Encounter for immunization: Secondary | ICD-10-CM

## 2020-12-03 DIAGNOSIS — I429 Cardiomyopathy, unspecified: Secondary | ICD-10-CM | POA: Diagnosis not present

## 2020-12-03 DIAGNOSIS — R079 Chest pain, unspecified: Secondary | ICD-10-CM | POA: Insufficient documentation

## 2020-12-03 DIAGNOSIS — Z79899 Other long term (current) drug therapy: Secondary | ICD-10-CM | POA: Insufficient documentation

## 2020-12-03 DIAGNOSIS — Z9151 Personal history of suicidal behavior: Secondary | ICD-10-CM

## 2020-12-03 DIAGNOSIS — Z951 Presence of aortocoronary bypass graft: Secondary | ICD-10-CM

## 2020-12-03 DIAGNOSIS — I110 Hypertensive heart disease with heart failure: Secondary | ICD-10-CM | POA: Diagnosis present

## 2020-12-03 DIAGNOSIS — I2582 Chronic total occlusion of coronary artery: Secondary | ICD-10-CM | POA: Diagnosis present

## 2020-12-03 DIAGNOSIS — R000 Tachycardia, unspecified: Secondary | ICD-10-CM | POA: Diagnosis not present

## 2020-12-03 DIAGNOSIS — I959 Hypotension, unspecified: Secondary | ICD-10-CM | POA: Diagnosis not present

## 2020-12-03 DIAGNOSIS — I2510 Atherosclerotic heart disease of native coronary artery without angina pectoris: Secondary | ICD-10-CM | POA: Insufficient documentation

## 2020-12-03 DIAGNOSIS — Z8249 Family history of ischemic heart disease and other diseases of the circulatory system: Secondary | ICD-10-CM | POA: Diagnosis not present

## 2020-12-03 DIAGNOSIS — Z955 Presence of coronary angioplasty implant and graft: Secondary | ICD-10-CM | POA: Diagnosis not present

## 2020-12-03 DIAGNOSIS — I4891 Unspecified atrial fibrillation: Secondary | ICD-10-CM | POA: Diagnosis present

## 2020-12-03 DIAGNOSIS — I25810 Atherosclerosis of coronary artery bypass graft(s) without angina pectoris: Secondary | ICD-10-CM | POA: Diagnosis present

## 2020-12-03 DIAGNOSIS — Z87891 Personal history of nicotine dependence: Secondary | ICD-10-CM | POA: Insufficient documentation

## 2020-12-03 DIAGNOSIS — I214 Non-ST elevation (NSTEMI) myocardial infarction: Principal | ICD-10-CM | POA: Diagnosis present

## 2020-12-03 DIAGNOSIS — I483 Typical atrial flutter: Secondary | ICD-10-CM | POA: Diagnosis not present

## 2020-12-03 DIAGNOSIS — E7800 Pure hypercholesterolemia, unspecified: Secondary | ICD-10-CM | POA: Diagnosis present

## 2020-12-03 HISTORY — PX: CORONARY STENT INTERVENTION: CATH118234

## 2020-12-03 HISTORY — PX: LEFT HEART CATH AND CORS/GRAFTS ANGIOGRAPHY: CATH118250

## 2020-12-03 LAB — RESP PANEL BY RT-PCR (FLU A&B, COVID) ARPGX2
Influenza A by PCR: NEGATIVE
Influenza B by PCR: NEGATIVE
SARS Coronavirus 2 by RT PCR: NEGATIVE

## 2020-12-03 LAB — CBC
HCT: 50.6 % (ref 39.0–52.0)
HCT: 47.1 % (ref 39.0–52.0)
Hemoglobin: 15.7 g/dL (ref 13.0–17.0)
Hemoglobin: 14.6 g/dL (ref 13.0–17.0)
MCH: 28.2 pg (ref 26.0–34.0)
MCH: 27.9 pg (ref 26.0–34.0)
MCHC: 31 g/dL (ref 30.0–36.0)
MCHC: 31 g/dL (ref 30.0–36.0)
MCV: 90.8 fL (ref 80.0–100.0)
MCV: 89.9 fL (ref 80.0–100.0)
Platelets: 303 10*3/uL (ref 150–400)
Platelets: 255 10*3/uL (ref 150–400)
RBC: 5.57 MIL/uL (ref 4.22–5.81)
RBC: 5.24 MIL/uL (ref 4.22–5.81)
RDW: 17.4 % — ABNORMAL HIGH (ref 11.5–15.5)
RDW: 17.6 % — ABNORMAL HIGH (ref 11.5–15.5)
WBC: 10.7 10*3/uL — ABNORMAL HIGH (ref 4.0–10.5)
WBC: 7.5 10*3/uL (ref 4.0–10.5)
nRBC: 0 % (ref 0.0–0.2)
nRBC: 0 % (ref 0.0–0.2)

## 2020-12-03 LAB — BASIC METABOLIC PANEL
Anion gap: 5 (ref 5–15)
Anion gap: 8 (ref 5–15)
BUN: 16 mg/dL (ref 6–20)
BUN: 16 mg/dL (ref 6–20)
CO2: 30 mmol/L (ref 22–32)
CO2: 28 mmol/L (ref 22–32)
Calcium: 9.8 mg/dL (ref 8.9–10.3)
Calcium: 9.8 mg/dL (ref 8.9–10.3)
Chloride: 106 mmol/L (ref 98–111)
Chloride: 104 mmol/L (ref 98–111)
Creatinine, Ser: 0.83 mg/dL (ref 0.61–1.24)
Creatinine, Ser: 0.9 mg/dL (ref 0.61–1.24)
GFR, Estimated: >60 mL/min (ref >60)
GFR, Estimated: >60 mL/min (ref >60)
Glucose, Bld: 93 mg/dL (ref 70–99)
Glucose, Bld: 87 mg/dL (ref 70–99)
Potassium: 4.5 mmol/L (ref 3.5–5.1)
Potassium: 4 mmol/L (ref 3.5–5.1)
Sodium: 141 mmol/L (ref 135–145)
Sodium: 140 mmol/L (ref 135–145)

## 2020-12-03 LAB — TROPONIN I (HIGH SENSITIVITY)
Troponin I (High Sensitivity): 245 ng/L (ref <18)
Troponin I (High Sensitivity): 186 ng/L (ref <18)
Troponin I (High Sensitivity): 263 ng/L (ref <18)
Troponin I (High Sensitivity): 201 ng/L (ref <18)

## 2020-12-03 LAB — CREATININE, SERUM
Creatinine, Ser: 0.84 mg/dL (ref 0.61–1.24)
GFR, Estimated: >60 mL/min (ref >60)

## 2020-12-03 LAB — BRAIN NATRIURETIC PEPTIDE: B Natriuretic Peptide: 122.1 pg/mL — ABNORMAL HIGH (ref 0.0–100.0)

## 2020-12-03 LAB — POCT ACTIVATED CLOTTING TIME: Activated Clotting Time: 405 seconds

## 2020-12-03 LAB — PROTIME-INR
INR: 1 (ref 0.8–1.2)
Prothrombin Time: 13.4 seconds (ref 11.4–15.2)

## 2020-12-03 IMAGING — CR DG CHEST 1V
1 series · 1 of 1 positions shown · non-contrast
Comparison: [DATE].

CLINICAL DATA: Pt states chest pain x 1 week HX: former smoker, HTN

EXAM:
CHEST  1 VIEW

[view not recorded]
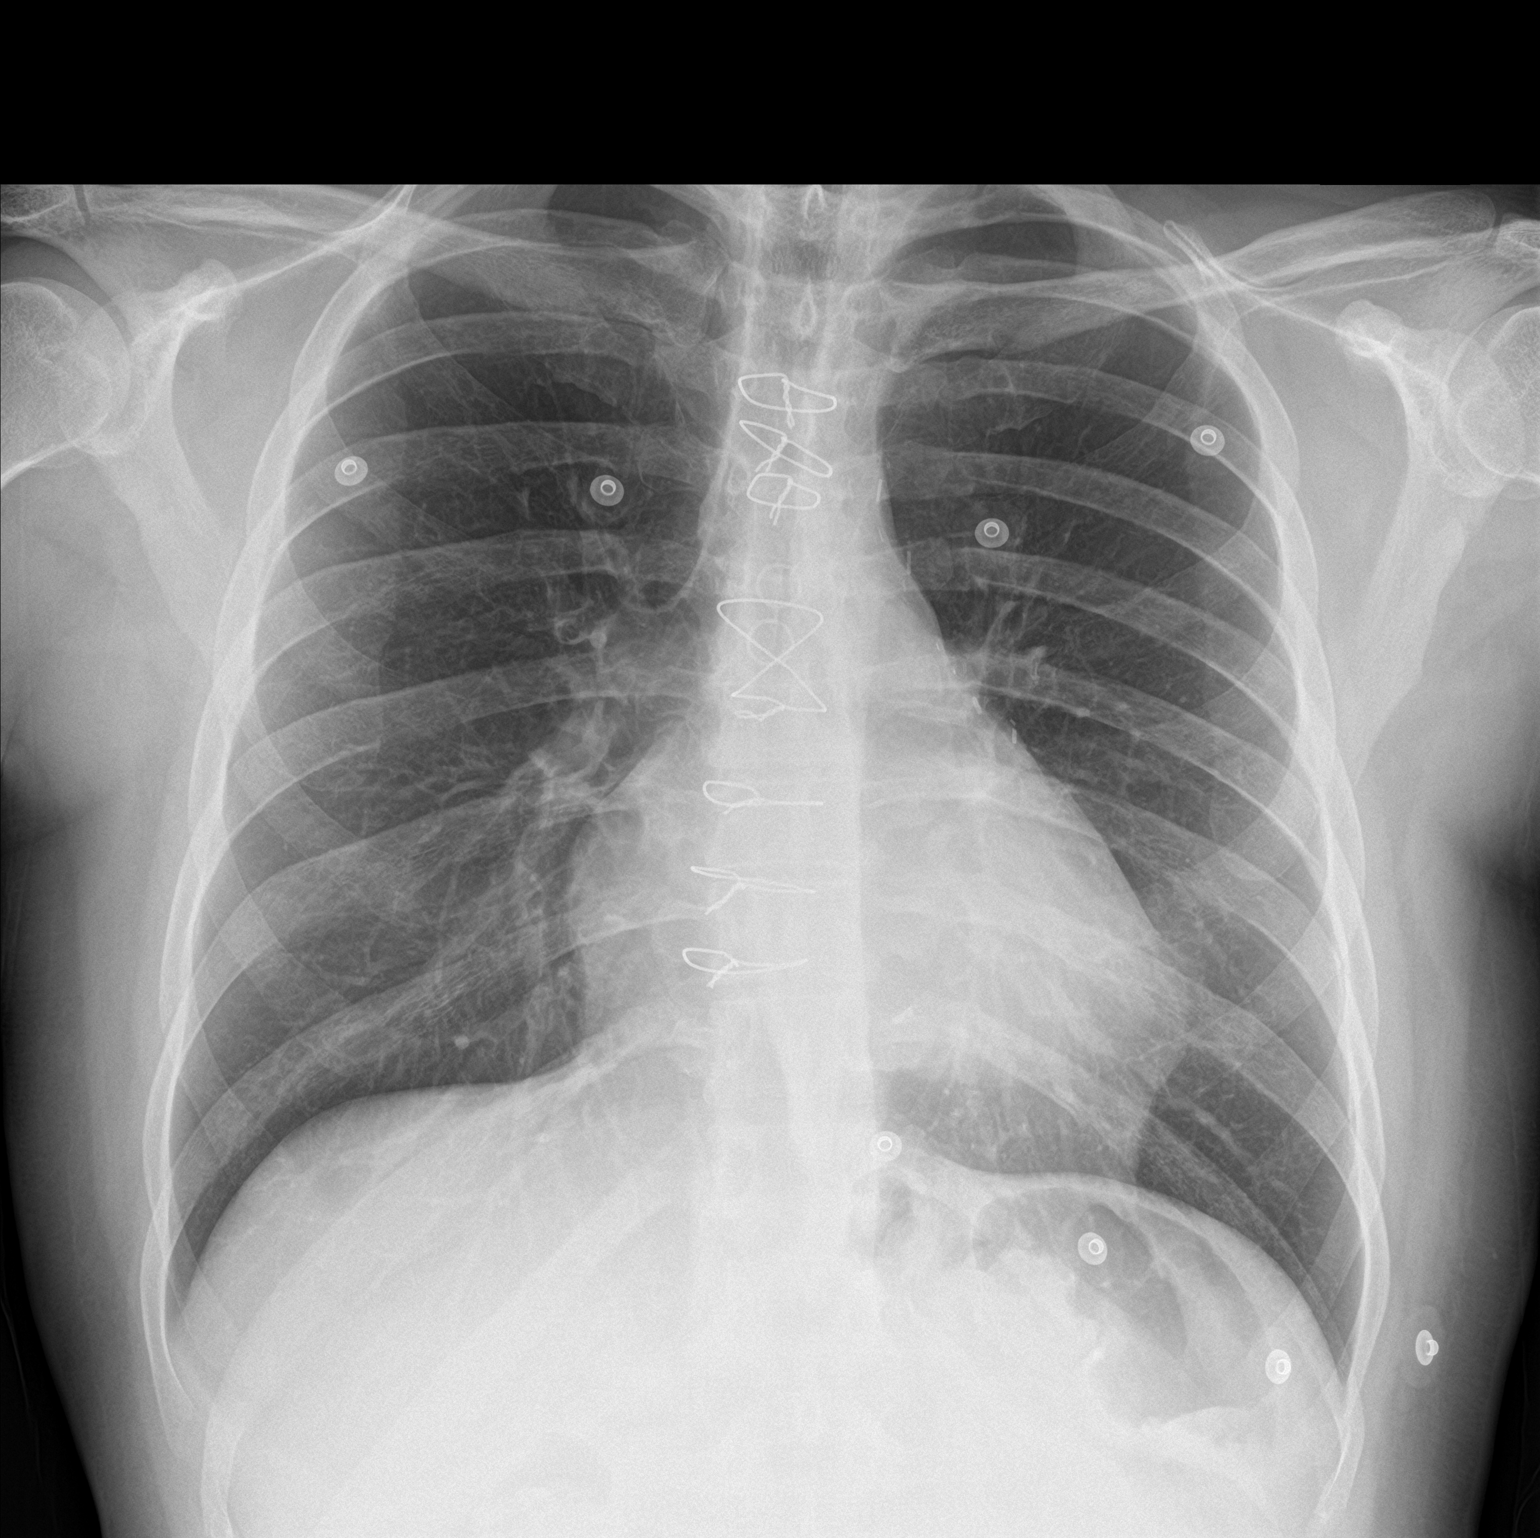

[1 of 1 positions shown; findings below may reference images not displayed]

FINDINGS: Stable changes from prior CABG surgery. Cardiac silhouette is normal
in size. Normal mediastinal and hilar contours.

Clear lungs.  No pleural effusion or pneumothorax.

Skeletal structures are grossly intact.
IMPRESSION: No active disease.

## 2020-12-03 SURGERY — LEFT HEART CATH AND CORS/GRAFTS ANGIOGRAPHY
Anesthesia: LOCAL

## 2020-12-03 MED ORDER — FENTANYL CITRATE (PF) 100 MCG/2ML IJ SOLN
INTRAMUSCULAR | Status: AC
  Filled 2020-12-03: qty 2

## 2020-12-03 MED ORDER — TICAGRELOR 90 MG PO TABS
ORAL_TABLET | ORAL | Status: AC
  Filled 2020-12-03: qty 2

## 2020-12-03 MED ORDER — SODIUM CHLORIDE 0.9 % IV BOLUS
1000.0000 mL | Freq: Once | INTRAVENOUS | Status: AC
  Administered 2020-12-03: 1000 mL via INTRAVENOUS
  Administered 2020-12-03: 500 mL via INTRAVENOUS

## 2020-12-03 MED ORDER — ASPIRIN EC 81 MG PO TBEC
81.0000 mg | DELAYED_RELEASE_TABLET | Freq: Every day | ORAL | Status: DC
  Administered 2020-12-04 – 2020-12-06 (×3): 81 mg via ORAL
  Filled 2020-12-03 (×4): qty 1

## 2020-12-03 MED ORDER — TICAGRELOR 90 MG PO TABS
90.0000 mg | ORAL_TABLET | Freq: Two times a day (BID) | ORAL | Status: DC
  Administered 2020-12-04: 90 mg via ORAL
  Filled 2020-12-03 (×3): qty 1

## 2020-12-03 MED ORDER — SODIUM CHLORIDE 0.9% FLUSH: 3.0000 mL | INTRAVENOUS | Status: DC | PRN

## 2020-12-03 MED ORDER — ATORVASTATIN CALCIUM 80 MG PO TABS
80.0000 mg | ORAL_TABLET | Freq: Every day | ORAL | Status: DC
  Administered 2020-12-04 – 2020-12-06 (×3): 80 mg via ORAL
  Filled 2020-12-03 (×4): qty 1

## 2020-12-03 MED ORDER — SODIUM CHLORIDE 0.9% FLUSH
3.0000 mL | Freq: Two times a day (BID) | INTRAVENOUS | Status: DC
  Administered 2020-12-03: 3 mL via INTRAVENOUS

## 2020-12-03 MED ORDER — ASPIRIN 81 MG PO CHEW
243.0000 mg | CHEWABLE_TABLET | Freq: Once | ORAL | Status: AC
  Administered 2020-12-03: 243 mg via ORAL
  Filled 2020-12-03: qty 3

## 2020-12-03 MED ORDER — TICAGRELOR 90 MG PO TABS
ORAL_TABLET | ORAL | Status: DC | PRN
  Administered 2020-12-03: 180 mg via ORAL

## 2020-12-03 MED ORDER — HEPARIN SODIUM (PORCINE) 1000 UNIT/ML IJ SOLN
INTRAMUSCULAR | Status: AC
  Filled 2020-12-03: qty 1

## 2020-12-03 MED ORDER — SODIUM CHLORIDE 0.9 % WEIGHT BASED INFUSION: 1.0000 mL/kg/h | INTRAVENOUS | Status: AC

## 2020-12-03 MED ORDER — SODIUM CHLORIDE 0.9 % IV SOLN
INTRAVENOUS | Status: DC | PRN
  Administered 2020-12-03: 1.75 mg/kg/h via INTRAVENOUS

## 2020-12-03 MED ORDER — IOHEXOL 350 MG/ML SOLN
INTRAVENOUS | Status: DC | PRN
  Administered 2020-12-03: 180 mL

## 2020-12-03 MED ORDER — MIDAZOLAM HCL 2 MG/2ML IJ SOLN
INTRAMUSCULAR | Status: DC | PRN
  Administered 2020-12-03: 1 mg via INTRAVENOUS

## 2020-12-03 MED ORDER — SODIUM CHLORIDE 0.9 % IV SOLN: 250.0000 mL | INTRAVENOUS | Status: DC | PRN

## 2020-12-03 MED ORDER — VERAPAMIL HCL 2.5 MG/ML IV SOLN
INTRAVENOUS | Status: AC
  Filled 2020-12-03: qty 2

## 2020-12-03 MED ORDER — DAPAGLIFLOZIN PROPANEDIOL 10 MG PO TABS
10.0000 mg | ORAL_TABLET | Freq: Every day | ORAL | Status: DC
  Administered 2020-12-04: 10 mg via ORAL
  Filled 2020-12-03 (×2): qty 1

## 2020-12-03 MED ORDER — ENOXAPARIN SODIUM 40 MG/0.4ML IJ SOSY
40.0000 mg | PREFILLED_SYRINGE | INTRAMUSCULAR | Status: DC
  Administered 2020-12-04 – 2020-12-05 (×2): 40 mg via SUBCUTANEOUS
  Filled 2020-12-03 (×2): qty 0.4

## 2020-12-03 MED ORDER — SODIUM CHLORIDE 0.9% FLUSH: 3.0000 mL | Freq: Two times a day (BID) | INTRAVENOUS | Status: DC

## 2020-12-03 MED ORDER — HEPARIN (PORCINE) IN NACL 1000-0.9 UT/500ML-% IV SOLN
INTRAVENOUS | Status: AC
  Filled 2020-12-03: qty 1000

## 2020-12-03 MED ORDER — BIVALIRUDIN TRIFLUOROACETATE 250 MG IV SOLR
INTRAVENOUS | Status: AC
  Filled 2020-12-03: qty 250

## 2020-12-03 MED ORDER — HEPARIN (PORCINE) IN NACL 1000-0.9 UT/500ML-% IV SOLN
INTRAVENOUS | Status: DC | PRN
  Administered 2020-12-03 (×2): 500 mL

## 2020-12-03 MED ORDER — SODIUM CHLORIDE 0.9 % IV SOLN
INTRAVENOUS | Status: AC | PRN
  Administered 2020-12-03: 250 mL via INTRAVENOUS

## 2020-12-03 MED ORDER — LIDOCAINE HCL (PF) 1 % IJ SOLN
INTRAMUSCULAR | Status: AC
  Filled 2020-12-03: qty 30

## 2020-12-03 MED ORDER — LIDOCAINE HCL (PF) 1 % IJ SOLN
INTRAMUSCULAR | Status: DC | PRN
  Administered 2020-12-03: 20 mL

## 2020-12-03 MED ORDER — HEPARIN BOLUS VIA INFUSION
4000.0000 [IU] | Freq: Once | INTRAVENOUS | Status: DC
  Filled 2020-12-03: qty 4000

## 2020-12-03 MED ORDER — HEPARIN (PORCINE) 25000 UT/250ML-% IV SOLN: 950.0000 [IU]/h | INTRAVENOUS | Status: DC

## 2020-12-03 MED ORDER — MIDAZOLAM HCL 2 MG/2ML IJ SOLN
INTRAMUSCULAR | Status: AC
  Filled 2020-12-03: qty 2

## 2020-12-03 MED ORDER — FENTANYL CITRATE (PF) 100 MCG/2ML IJ SOLN
INTRAMUSCULAR | Status: DC | PRN
  Administered 2020-12-03: 25 ug via INTRAVENOUS

## 2020-12-03 MED ORDER — ONDANSETRON HCL 4 MG/2ML IJ SOLN: 4.0000 mg | Freq: Four times a day (QID) | INTRAMUSCULAR | Status: DC | PRN

## 2020-12-03 MED ORDER — SODIUM CHLORIDE 0.9 % IV BOLUS: 1000.0000 mL | Freq: Once | INTRAVENOUS | Status: DC

## 2020-12-03 MED ORDER — ACETAMINOPHEN 325 MG PO TABS: 650.0000 mg | ORAL_TABLET | ORAL | Status: DC | PRN

## 2020-12-03 SURGICAL SUPPLY — 19 items
BALLN SAPPHIRE 2.5X12 (BALLOONS) ×2
BALLN SAPPHIRE ~~LOC~~ 3.0X12 (BALLOONS) ×2 IMPLANT
BALLOON SAPPHIRE 2.5X12 (BALLOONS) ×1 IMPLANT
CATH EXPO 5F MPA-1 (CATHETERS) ×2 IMPLANT
CATH INFINITI 5FR AL1 (CATHETERS) ×2 IMPLANT
CATH INFINITI 5FR ANG PIGTAIL (CATHETERS) ×2 IMPLANT
CATH INFINITI 5FR MULTPACK ANG (CATHETERS) ×2 IMPLANT
CATH LAUNCHER 6FR JL4 (CATHETERS) ×2 IMPLANT
KIT ENCORE 26 ADVANTAGE (KITS) ×2 IMPLANT
KIT HEART LEFT (KITS) ×2 IMPLANT
PACK CARDIAC CATHETERIZATION (CUSTOM PROCEDURE TRAY) ×2 IMPLANT
SHEATH PINNACLE 5F 10CM (SHEATH) ×2 IMPLANT
SHEATH PINNACLE 6F 10CM (SHEATH) ×2 IMPLANT
STENT ONYX FRONTIER 3.0X15 (Permanent Stent) ×2 IMPLANT
SYR MEDRAD MARK 7 150ML (SYRINGE) ×2 IMPLANT
TRANSDUCER W/STOPCOCK (MISCELLANEOUS) ×2 IMPLANT
TUBING CIL FLEX 10 FLL-RA (TUBING) ×2 IMPLANT
WIRE ASAHI PROWATER 180CM (WIRE) ×2 IMPLANT
WIRE J 3MM .035X145CM (WIRE) ×2 IMPLANT

## 2020-12-03 NOTE — ED Provider Notes (Addendum)
Emergency Medicine Provider Triage Evaluation Note  Wyatt Donovan , a 35 y.o. male  was evaluated in triage.  Pt complains of chest pain onset 3-4 days ago, intermittent, no clear aggravating or alleviating factors.  Patient reports history of quadruple bypass, sent in by his cardiologist this morning for concern of EKG changes and chest.  Review of Systems  Positive: Chest pain Negative: Fever, chills, fall, injury, abdominal pain, nausea, vomiting, diarrhea, extremity swelling/color change, cough/hemoptysis or any additional concerns  Physical Exam  BP 102/77   Pulse 70   Temp 97.6 F (36.4 C) (Oral)   Resp 16   Ht 6\' 1"  (1.854 m)   Wt 80.7 kg   SpO2 100%   BMI 23.48 kg/m  Gen:   Awake, no distress   Resp:  Normal effort  MSK:   Moves extremities without difficulty  Other:  Heart regular rate and rhythm.  Lungs clear.  Medical Decision Making  Medically screening exam initiated at 11:57 AM.  Appropriate orders placed.  Wyatt Donovan was informed that the remainder of the evaluation will be completed by another provider, this initial triage assessment does not replace that evaluation, and the importance of remaining in the ED until their evaluation is complete.  EKG reviewed with Dr. Dina Rich, no acute ischemic changes.  Chest pain labs and CXR ordered. Cardiology consult placed. Unable to review notes from visit today.  12:38: Consulted with [place] cardiology; advises they will evaluate the patient once in room. - Charge nurse made aware.  Note: Portions of this report may have been transcribed using voice recognition software. Every effort was made to ensure accuracy; however, inadvertent computerized transcription errors may still be present.    Deliah Boston, PA-C 12/03/20 1206    Deliah Boston, PA-C 12/03/20 1207    Deliah Boston, PA-C 12/03/20 1303    Lorelle Gibbs, DO 12/03/20 1307

## 2020-12-03 NOTE — Patient Instructions (Addendum)
It was a pleasure seeing you today!  MEDICATIONS: -No medication changes today -[name] wants you to go to the ED for further workup for chest pain.  -Call if you have questions about your medications.   NEXT APPOINTMENT: Return to clinic in 1 month with APP Clinic.  In general, to take care of your heart failure: -Limit your fluid intake to 2 Liters (half-gallon) per day.   -Limit your salt intake to ideally 2-3 grams (2000-3000 mg) per day. -Weigh yourself daily and record, and bring that "weight diary" to your next appointment.  (Weight gain of 2-3 pounds in 1 day typically means fluid weight.) -The medications for your heart are to help your heart and help you live longer.   -Please contact us before stopping any of your heart medications.  Call the clinic at [phone] with questions or to reschedule future appointments.

## 2020-12-03 NOTE — Progress Notes (Signed)
ANTICOAGULATION CONSULT NOTE - Initial Consult  Pharmacy Consult for Heparin Indication: chest pain/ACS  Allergies  Allergen Reactions   Codeine Itching    Patient Measurements: Height: 6\' 1"  (185.4 cm) Weight: 80.7 kg (178 lb) IBW/kg (Calculated) : 79.9 Heparin Dosing Weight: 80.7 kg  Vital Signs: Temp: 97.6 F (36.4 C) (10/04 1152) Temp Source: Oral (10/04 1152) BP: 88/68 (10/04 1320) Pulse Rate: 56 (10/04 1320)  Labs: Recent Labs    12/03/20 1053 12/03/20 1203  HGB  --  15.7  HCT  --  50.6  PLT  --  303  CREATININE 0.83 0.90  TROPONINIHS  --  245*    Estimated Creatinine Clearance: 127 mL/min (by C-G formula based on SCr of 0.9 mg/dL).   Medical History: Past Medical History:  Diagnosis Date   Atypical chest pain    CAD (coronary artery disease)    Dyslipidemia    Ex-smoker    History of depression    Insomnia    Ischemic cardiomyopathy    Ejection fraction 40-45%   NSTEMI (non-ST elevated myocardial infarction) ([place]) 08/2007   Treated with a bare metal stent to the proximal LAD   Suicide attempt ([place]) 01/2001    Medications:  (Not in a hospital admission)  Scheduled:  Infusions:   sodium chloride     sodium chloride     PRN:   Assessment: 74 yom with a history of CAD, ischemic cardiomyopathy, NSTEMI, status post CABG x4, dyslipidemia. Patient is presenting with chest pain. Heparin per pharmacy consult placed for chest pain/ACS.  Patient is on not on anticoagulation prior to arrival.  Hgb 15.7; plt 303  Goal of Therapy:  Heparin level 0.3-0.7 units/ml Monitor platelets by anticoagulation protocol: Yes   Plan:  Give 4000 units bolus x 1 Start heparin infusion at 950 units/hr Check anti-Xa level in 6 hours and daily while on heparin Continue to monitor H&H and platelets  Lorelei Pont, PharmD, BCPS 12/03/2020 2:03 PM ED Clinical Pharmacist -  [phone]

## 2020-12-03 NOTE — ED Notes (Signed)
Pt's BP was 75/56 and was rechecked resulting in 88/68 MAP 76 Pulse 56 O2 98 AND RR 16. Triage RN is aware.

## 2020-12-03 NOTE — ED Triage Notes (Signed)
Pt reports intermittent centralized chest pain for the past week, some sob on exertion. Pt sent here by cardiologist office this morning for further evaluation. Hx of quad bypass in April of this year. Pt a.o, resp e.u

## 2020-12-03 NOTE — Progress Notes (Signed)
Site area: right groin  Site Prior to Removal:  Level 0  Pressure Applied For 25 MINUTES    Minutes Beginning at 1810  Manual:   Yes.    Patient Status During Pull:  Stable  Post Pull Groin Site:  Level 0  Post Pull Instructions Given:  Yes.    Post Pull Pulses Present:  Yes.    Dressing Applied:  Yes.    Comments:  Bed rest started at 1810 X 4 hr.

## 2020-12-03 NOTE — ED Provider Notes (Addendum)
[facility] EMERGENCY DEPARTMENT Provider Note   CSN: [id number] Arrival date & time: 12/03/20  1141     History Chief Complaint  Patient presents with   Chest Pain    Wyatt Donovan is a 35 y.o. male history includes CAD, ischemic cardiomyopathy, NSTEMI, status post CABG x4, dyslipidemia.  Patient arrives for chest pain intermittent for the past few days, no clear aggravating or alleviating factors.  Patient reports he was seen by his cardiologist today and was sent in for concern of EKG changes.  He reports chest pain that has improved today compared to yesterday describes it as a mild pressure in the center of his chest which does not radiate.  Additionally took aspirin along with daily medications this morning.  Patient denied fever, chills, fall/injury, Donnell pain, nausea/vomiting, diarrhea, extremity swelling/color change, cough/hemoptysis or any additional concerns. 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/03/2020   Coronary artery disease 07/03/2020   Non-ST elevation (NSTEMI) myocardial infarction [facility])    Chest pain at rest 06/29/2020    Past Surgical History:  Procedure Laterality Date   ADENOIDECTOMY     CORONARY ARTERY BYPASS GRAFT N/A 07/03/2020   Procedure: CORONARY ARTERY BYPASS GRAFTING (CABG) times four using left internal mammary artery, right arm radial artery and right leg saphenous vein;  Surgeon: Lajuana Matte, MD;  Location: [facility] OR;  Service: Open Heart Surgery;  Laterality: N/A;   CORONARY STENT PLACEMENT     Bare  metal stent to proximal LAD   LEFT HEART CATH AND CORONARY ANGIOGRAPHY N/A 07/01/2020   Procedure: LEFT HEART CATH AND CORONARY ANGIOGRAPHY;  Surgeon: Lorretta Harp, MD;  Location: [facility] CV LAB;  Service: Cardiovascular;  Laterality: N/A;   RADIAL ARTERY HARVEST Right 07/03/2020   Procedure: RADIAL ARTERY HARVEST;  Surgeon: Lajuana Matte, MD;  Location: [facility];  Service: Open Heart Surgery;  Laterality: Right;   TEE WITHOUT CARDIOVERSION N/A 07/03/2020   Procedure: TRANSESOPHAGEAL ECHOCARDIOGRAM (TEE);  Surgeon: Lajuana Matte, MD;  Location: [facility];  Service: Open Heart Surgery;  Laterality: N/A;       Family History  Problem Relation Age of Onset   Hypertension Neg Hx    Diabetes Neg Hx    Coronary artery disease Neg Hx     Social History   Tobacco Use   Smoking status: Former    Packs/day: 1.00    Years: 22.00    Pack years: 22.00    Types: Cigarettes, Cigars    Quit date: 06/28/2020    Years since quitting: 0.4   Smokeless tobacco: Never   Tobacco comments:    cigarettes stopped in 2020, [employer] started in 2018-04-05/day  Vaping Use   Vaping Use: Never used  Substance Use Topics   Alcohol use: Never   Drug use: Not Currently    Types: Marijuana    Home Medications Prior to Admission medications   Medication Sig Start Date End Date Taking?  Authorizing Provider  aspirin EC 81 MG tablet Take 81 mg by mouth daily. Swallow whole.    [provider]  atorvastatin (LIPITOR) 80 MG tablet TAKE 1 Tablet BY MOUTH ONCE DAILY 12/02/20   Bensimhon, Shaune Pascal, MD  carvedilol (COREG) 3.125 MG tablet Take 1 tablet (3.125 mg total) by mouth 2 (two) times daily with a meal. 10/04/20   Larey Dresser, MD  dapagliflozin propanediol (FARXIGA) 10 MG TABS tablet Take 1 tablet (10 mg total) by mouth daily before breakfast. 07/10/20   Roddenberry, Arlis Porta, PA-C  isosorbide mononitrate (ISMO) 10 MG tablet Take 10 mg by mouth daily. 12/02/20   [provider]   losartan (COZAAR) 25 MG tablet Take 0.5 tablets (12.5 mg total) by mouth at bedtime. 09/06/20 12/05/20  Rafael Bihari, FNP  nitroGLYCERIN (NITROSTAT) 0.4 MG SL tablet Place 0.4 mg under the tongue every 5 (five) minutes as needed for chest pain.    [provider]  spironolactone (ALDACTONE) 25 MG tablet Take 0.5 tablets (12.5 mg total) by mouth daily. 11/06/20 02/04/21  Rafael Bihari, FNP    Allergies    Codeine  Review of Systems   Review of Systems Ten systems are reviewed and are negative for acute change except as noted in the HPI Physical Exam Updated Vital Signs BP 106/75   Pulse 68   Temp 97.6 F (36.4 C) (Oral)   Resp 15   Ht 6\' 1"  (1.854 m)   Wt 80.7 kg   SpO2 100%   BMI 23.48 kg/m   Physical Exam Constitutional:      General: He is not in acute distress.    Appearance: Normal appearance. He is well-developed. He is not ill-appearing or diaphoretic.  HENT:     Head: Normocephalic and atraumatic.  Eyes:     General: Vision grossly intact. Gaze aligned appropriately.     Pupils: Pupils are equal, round, and reactive to light.  Neck:     Trachea: Trachea and phonation normal.  Cardiovascular:     Rate and Rhythm: Normal rate and regular rhythm.  Pulmonary:     Effort: Pulmonary effort is normal. No respiratory distress.  Abdominal:     General: There is no distension.     Palpations: Abdomen is soft.     Tenderness: There is no abdominal tenderness. There is no guarding or rebound.  Musculoskeletal:        General: Normal range of motion.     Cervical back: Normal range of motion.     Right lower leg: No edema.     Left lower leg: No edema.  Skin:    General: Skin is warm and dry.  Neurological:     Mental Status: He is alert.     GCS: GCS eye subscore is 4. GCS verbal subscore is 5. GCS motor subscore is 6.     Comments: Speech is clear and goal oriented, follows commands Major Cranial nerves without deficit, no facial droop Moves  extremities without ataxia, coordination intact  Psychiatric:        Behavior: Behavior normal.    ED Results / Procedures / Treatments   Labs (all labs ordered are listed, but only abnormal results are displayed) Labs Reviewed  CBC - Abnormal; Notable for the following components:      Result Value   WBC 10.7 (*)    RDW 17.4 (*)    All other components within normal limits  BRAIN NATRIURETIC PEPTIDE - Abnormal; Notable  for the following components:   B Natriuretic Peptide 122.1 (*)    All other components within normal limits  TROPONIN I (HIGH SENSITIVITY) - Abnormal; Notable for the following components:   Troponin I (High Sensitivity) 245 (*)    All other components within normal limits  RESP PANEL BY RT-PCR (FLU A&B, COVID) ARPGX2  BASIC METABOLIC PANEL  HEPARIN LEVEL (UNFRACTIONATED)  PROTIME-INR  TROPONIN I (HIGH SENSITIVITY)    EKG EKG Interpretation  Date/Time:  Tuesday December 03 2020 11:49:10 EDT Ventricular Rate:  81 PR Interval:  182 QRS Duration: 116 QT Interval:  404 QTC Calculation: 469 R Axis:   18 Text Interpretation: Normal sinus rhythm Possible Left atrial enlargement Inferior-posterior infarct , age undetermined Abnormal ECG NSR similar to previous Confirmed by Lavenia Atlas [phone]) on 12/03/2020 12:01:55 PM  Radiology DG Chest 1 View  Result Date: 12/03/2020 CLINICAL DATA:  Pt states chest pain x 1 week HX: former smoker, HTN EXAM: CHEST  1 VIEW COMPARISON:  08/09/2020. FINDINGS: Stable changes from prior CABG surgery. Cardiac silhouette is normal in size. Normal mediastinal and hilar contours. Clear lungs.  No pleural effusion or pneumothorax. Skeletal structures are grossly intact. IMPRESSION: No active disease. Electronically Signed   By: Lajean Manes M.D.   On: 12/03/2020 12:56    Procedures .Critical Care Performed by: Deliah Boston, PA-C Authorized by: Deliah Boston, PA-C   Critical care provider statement:    Critical care time  (minutes):  35   Critical care was necessary to treat or prevent imminent or life-threatening deterioration of the following conditions:  Cardiac failure   Critical care was time spent personally by me on the following activities:  Discussions with consultants, evaluation of patient's response to treatment, examination of patient, ordering and performing treatments and interventions, ordering and review of laboratory studies, ordering and review of radiographic studies, pulse oximetry, re-evaluation of patient's condition, obtaining history from patient or surrogate, review of old charts and development of treatment plan with patient or surrogate   Medications Ordered in ED Medications  heparin bolus via infusion 4,000 Units ( Intravenous MAR Hold 12/03/20 1440)  heparin ADULT infusion 100 units/mL (25000 units/260mL) (has no administration in time range)  sodium chloride flush (NS) 0.9 % injection 3 mL ( Intravenous Automatically Held 12/11/20 2200)  sodium chloride 0.9 % bolus 1,000 mL (1,000 mLs Intravenous New Bag/Given 12/03/20 1434)  aspirin chewable tablet 243 mg (243 mg Oral Given 12/03/20 1432)    ED Course  I have reviewed the triage vital signs and the nursing notes.  Pertinent labs & imaging results that were available during my care of the patient were reviewed by me and considered in my medical decision making (see chart for details).  Clinical Course as of 12/03/20 1449  Tue Dec 03, 2020  [facility] [BM]  [facility] [BM]    Clinical Course User Index [BM] Gari Crown   MDM Rules/Calculators/A&P                           Additional history obtained from: Nursing notes from this visit. Review of electronic medical records. ============ I originally evaluate this patient in triage as part of an MSE.  At that time he reported his chest pain to be improving.  I reviewed the EKG with Dr. Dina Rich and there were no acute ischemic changes compared to  EKG around a month ago.  Chest pain labs and  chest x-ray were ordered.  I placed a consult to cardiology for recommendations.  I spoke with Trish from cardiology at 12:38 PM who advised they will evaluate the patient once he was in her room, no additional recommendations at this time.  I then made the charge nurse aware the patient will need a room. ------- 1:43 PM: I received a phone call from Gering from cardiology informing me that the patient's troponin level was 245 and they needed patient to be in her room for immediate evaluation.  I then found charge nurse and informed them of the situation.  Patient is being moved to next available room.  I reevaluated the patient in the lobby, and he is resting comfortably in wheelchair in no acute distress.  He reports chest pain somewhat worse compared to my prior evaluation.  Patient was immediately moved to room 21.  I reviewed and interpreted the patient's labs including a CBC which shows a mild leuks ptosis of 10.7, no thrombocytopenia or anemia.  BMP was within normal limits, without emergent electrolyte derangement, AKI or gap.  BNP elevated at 122.1 which is somewhat improved compared to prior.  High-sensitivity troponin was 245.  CXR:  IMPRESSION:  No active disease.   I discussed the case with Dr. Gilford Raid, heparin and IV fluids were ordered.  Patient seen and evaluated by Dr. Gilford Raid. -------------------- 1:53 PM: Consulted placed to cardiology, spoke with Trish informed patient that he is in the room.  They are coming to evaluate the patient for anticipated catheterization. ------ Patient reevaluated he is resting comfortably in room, blood pressure improved.  Patient reports aspirin he took earlier was 81 mg, have ordered the additional 243 mg of aspirin.  IV heparin has been ordered.  Awaiting cardiology evaluation. ----- 2:30 PM: Patient reassessed resting comfortably bed no acute distress vital signs stable.  Patient taken to Cath Lab by  cardiology.   Note: Portions of this report may have been transcribed using voice recognition software. Every effort was made to ensure accuracy; however, inadvertent computerized transcription errors may still be present.  Final Clinical Impression(s) / ED Diagnoses Final diagnoses:  NSTEMI (non-ST elevated myocardial infarction) [facility])    Rx / [place] Orders ED Discharge Orders     None        Gari Crown 12/03/20 1410    Gari Crown 12/03/20 1449    Isla Pence, MD 12/03/20 1504

## 2020-12-03 NOTE — Telephone Encounter (Signed)
11:00 am Attempted return call from message left for this RN on 12/02/20 as I was out of office.  Was told client was at an appointment and would we please call back later.   No message left will attempt at later today.  [place] client was sent from cardiology appt to ED today. Will plan follow up later.  [place] [employer]

## 2020-12-03 NOTE — H&P (Addendum)
Advanced Heart Failure Team History and Physical Note   PCP:  Leonie Douglas, MD  PCP-Cardiology: Loralie Champagne, MD     Reason for Admission: NSTEMI    HPI:    35 y.o. with history of early onset CAD s/p CABG, ischemic cardiomyopathy, and LV thrombus. Patient had initial MI in 2012 at age 89, PCI to LAD.  He had inferoposterior MI in 4/22.  LHC showed 3 vessel disease, and patient had CABG x 4. Cardiac MRI in 5/22 showed LV EF 27% with LV thrombus, there was significant viability.  Post-op, patient had GI bleeding and anticoagulation was stopped.  He quit smoking after CABG.    Follow up 10/04/20 carvedilol decreased due to dizziness and repeat echo arranged.   Echo 8/22 EF 25-30%. He was referred to EP for ICD consideration. Awaiting initial consultation.  He now presents to ED w/ CC of SSCP c/w prior angina. Present at rest and worse w/ exertion. Also w/ DOE. HS trop elevated at 245. Currently w/ 6/10 active CP. Referred for urgent LHC. SBPs soft low 100s.    Review of Systems: [y] = yes, [ ]  = no   General: Weight gain [ ] ; Weight loss [ ] ; Anorexia [ ] ; Fatigue [ ] ; Fever [ ] ; Chills [ ] ; Weakness [ ]   Cardiac: Chest pain/pressure [ Y]; Resting SOB [Y ]; Exertional SOB [Y ]; Orthopnea [ ] ; Pedal Edema [ ] ; Palpitations [ ] ; Syncope [ ] ; Presyncope [ ] ; Paroxysmal nocturnal dyspnea[ ]   Pulmonary: Cough [ ] ; Wheezing[ ] ; Hemoptysis[ ] ; Sputum [ ] ; Snoring [ ]   GI: Vomiting[ ] ; Dysphagia[ ] ; Melena[ ] ; Hematochezia [ ] ; Heartburn[ ] ; Abdominal pain [ ] ; Constipation [ ] ; Diarrhea [ ] ; BRBPR [ ]   GU: Hematuria[ ] ; Dysuria [ ] ; Nocturia[ ]   Vascular: Pain in legs with walking [ ] ; Pain in feet with lying flat [ ] ; Non-healing sores [ ] ; Stroke [ ] ; TIA [ ] ; Slurred speech [ ] ;  Neuro: Headaches[ ] ; Vertigo[ ] ; Seizures[ ] ; Paresthesias[ ] ;Blurred vision [ ] ; Diplopia [ ] ; Vision changes [ ]   Ortho/Skin: Arthritis [ ] ; Joint pain [ ] ; Muscle pain [ ] ; Joint swelling [ ] ; Back Pain [  ]; Rash [ ]   Psych: Depression[ ] ; Anxiety[ ]   Heme: Bleeding problems [ ] ; Clotting disorders [ ] ; Anemia [ ]   Endocrine: Diabetes [ ] ; Thyroid dysfunction[ ]    Home Medications Prior to Admission medications   Medication Sig Start Date End Date Taking? Authorizing Provider  aspirin EC 81 MG tablet Take 81 mg by mouth daily. Swallow whole.    [provider]  atorvastatin (LIPITOR) 80 MG tablet TAKE 1 Tablet BY MOUTH ONCE DAILY 12/02/20   Bensimhon, Shaune Pascal, MD  carvedilol (COREG) 3.125 MG tablet Take 1 tablet (3.125 mg total) by mouth 2 (two) times daily with a meal. 10/04/20   Larey Dresser, MD  dapagliflozin propanediol (FARXIGA) 10 MG TABS tablet Take 1 tablet (10 mg total) by mouth daily before breakfast. 07/10/20   Roddenberry, Arlis Porta, PA-C  isosorbide mononitrate (ISMO) 10 MG tablet Take 10 mg by mouth daily. 12/02/20   [provider]  losartan (COZAAR) 25 MG tablet Take 0.5 tablets (12.5 mg total) by mouth at bedtime. 09/06/20 12/05/20  Rafael Bihari, FNP  nitroGLYCERIN (NITROSTAT) 0.4 MG SL tablet Place 0.4 mg under the tongue every 5 (five) minutes as needed for chest pain.    [provider]  spironolactone (ALDACTONE) 25 MG  tablet Take 0.5 tablets (12.5 mg total) by mouth daily. 11/06/20 02/04/21  Rafael Bihari, FNP    Past Medical History: Past Medical History:  Diagnosis Date   Atypical chest pain    CAD (coronary artery disease)    Dyslipidemia    Ex-smoker    History of depression    Insomnia    Ischemic cardiomyopathy    Ejection fraction 40-45%   NSTEMI (non-ST elevated myocardial infarction) ([place]) 08/2007   Treated with a bare metal stent to the proximal LAD   Suicide attempt ([place]) 01/2001    Past Surgical History: Past Surgical History:  Procedure Laterality Date   ADENOIDECTOMY     CORONARY ARTERY BYPASS GRAFT N/A 07/03/2020   Procedure: CORONARY ARTERY BYPASS GRAFTING (CABG) times four using left internal mammary artery,  right arm radial artery and right leg saphenous vein;  Surgeon: Lajuana Matte, MD;  Location: [place];  Service: Open Heart Surgery;  Laterality: N/A;   CORONARY STENT PLACEMENT     Bare metal stent to proximal LAD   LEFT HEART CATH AND CORONARY ANGIOGRAPHY N/A 07/01/2020   Procedure: LEFT HEART CATH AND CORONARY ANGIOGRAPHY;  Surgeon: Lorretta Harp, MD;  Location: [place] [facility];  Service: Cardiovascular;  Laterality: N/A;   RADIAL ARTERY HARVEST Right 07/03/2020   Procedure: RADIAL ARTERY HARVEST;  Surgeon: Lajuana Matte, MD;  Location: [place];  Service: Open Heart Surgery;  Laterality: Right;   TEE WITHOUT CARDIOVERSION N/A 07/03/2020   Procedure: TRANSESOPHAGEAL ECHOCARDIOGRAM (TEE);  Surgeon: Lajuana Matte, MD;  Location: [place];  Service: Open Heart Surgery;  Laterality: N/A;    Family History:  Family History  Problem Relation Age of Onset   Hypertension Neg Hx    Diabetes Neg Hx    Coronary artery disease Neg Hx     Social History: Social History   Socioeconomic History   Marital status: Single    Spouse name: Not on file   Number of children: Not on file   Years of education: Not on file   Highest education level: High school graduate  Occupational History   Occupation: Not actively working    Comment: applied for disability x 2.   Tobacco Use   Smoking status: Former    Packs/day: 1.00    Years: 22.00    Pack years: 22.00    Types: Cigarettes, Cigars    Quit date: 06/28/2020    Years since quitting: 0.4   Smokeless tobacco: Never   Tobacco comments:    cigarettes stopped in 2020, [employer] started in 2018-04-05/day  Vaping Use   Vaping Use: Never used  Substance and Sexual Activity   Alcohol use: Never   Drug use: Not Currently    Types: Marijuana   Sexual activity: Never  Other Topics Concern   Not on file  Social History Narrative   Single   Lives with his parents   Trying to get his GED   DeGent date all cultures   Social  Determinants of Health   Financial Resource Strain: High Risk   Difficulty of Paying Living Expenses: Hard  Food Insecurity: No Food Insecurity   Worried About Charity fundraiser in the Last Year: Never true   Ran Out of Food in the Last Year: Never true  Transportation Needs: No Transportation Needs   Lack of Transportation (Medical): No   Lack of Transportation (Non-Medical): No  Physical Activity: Not on file  Stress: Not on file  Social Connections: Not on file    Allergies:  Allergies  Allergen Reactions   Codeine Itching    Objective:    Vital Signs:   Temp:  [97.6 F (36.4 C)] 97.6 F (36.4 C) (10/04 1152) Pulse Rate:  [56-70] 56 (10/04 1320) Resp:  [16] 16 (10/04 1320) BP: (88-102)/(68-77) 88/68 (10/04 1320) SpO2:  [98 %-100 %] 98 % (10/04 1320) Weight:  [80.7 kg] 80.7 kg (10/04 1152)   Filed Weights   12/03/20 1152  Weight: 80.7 kg     Physical Exam     General:  Well appearing. No respiratory difficulty HEENT: Normal Neck: Supple. no JVD. Carotids 2+ bilat; no bruits. No lymphadenopathy or thyromegaly appreciated. Cor: PMI nondisplaced. Regular rate & rhythm. No rubs, gallops or murmurs. Lungs: Clear Abdomen: Soft, nontender, nondistended. No hepatosplenomegaly. No bruits or masses. Good bowel sounds. Extremities: No cyanosis, clubbing, rash, edema Neuro: Alert & oriented x 3, cranial nerves grossly intact. moves all 4 extremities w/o difficulty. Affect pleasant.   Telemetry   NSR 80s   EKG   NSR 81 bpm, no acute ST abnormalities   Labs     Basic Metabolic Panel: Recent Labs  Lab 12/03/20 1053 12/03/20 1203  NA 141 140  K 4.5 4.0  CL 106 104  CO2 30 28  GLUCOSE 93 87  BUN 16 16  CREATININE 0.83 0.90  CALCIUM 9.8 9.8    Liver Function Tests: No results for input(s): AST, ALT, ALKPHOS, BILITOT, PROT, ALBUMIN in the last 168 hours. No results for input(s): LIPASE, AMYLASE in the last 168 hours. No results for input(s): AMMONIA in  the last 168 hours.  CBC: Recent Labs  Lab 12/03/20 1203  WBC 10.7*  HGB 15.7  HCT 50.6  MCV 90.8  PLT 303    Cardiac Enzymes: No results for input(s): CKTOTAL, CKMB, CKMBINDEX, TROPONINI in the last 168 hours.  BNP: BNP (last 3 results) Recent Labs    07/08/20 0338 10/04/20 1109 12/03/20 1203  BNP 1,344.0* 197.9* 122.1*    ProBNP (last 3 results) No results for input(s): PROBNP in the last 8760 hours.   CBG: No results for input(s): GLUCAP in the last 168 hours.  Coagulation Studies: No results for input(s): LABPROT, INR in the last 72 hours.  Imaging: DG Chest 1 View  Result Date: 12/03/2020 CLINICAL DATA:  Pt states chest pain x 1 week HX: former smoker, HTN EXAM: CHEST  1 VIEW COMPARISON:  08/09/2020. FINDINGS: Stable changes from prior CABG surgery. Cardiac silhouette is normal in size. Normal mediastinal and hilar contours. Clear lungs.  No pleural effusion or pneumothorax. Skeletal structures are grossly intact. IMPRESSION: No active disease. Electronically Signed   By: Lajean Manes M.D.   On: 12/03/2020 12:56    Assessment/Plan   CAD/NSTEMI  - early onset CAD s/p LAD PCI in 2012, followed by CABG  x 4 4/22 - HS trop 245, w/ active 6/10 CP - Heparin per pharmacy  - Plan urgent LHC today  - ASA + high intensity statin   2. Chronic Systolic HF - ICM, recent Echo 8/22 EF 25-30%. - Euvolemic on exam  - Has been referred for ICD  - Hold GDMT w/ soft BP    Brittainy Simmons, PA-C 12/03/2020, 2:15 PM  Advanced Heart Failure Team Pager [phone] (M-F; 7a - 5p)  Please contact [facility] for night-coverage after hours (4p -7a ) and weekends on [email or web address]  Patient seen with PA, agree with the above note.  Patient came today for a pharmacy clinic appt, but reports episodes of substernal chest tightness feeling like prior ischemic pain.  These have been going on since Sunday.  He had severe SSCP yesterday lasting for several hours.  He was given NTG by  his PCP which resolved the pain.  Today, the pain has recurred but not as severely.    ECG with NSR, old inferior MI, somewhat more prominent anterior T waves than the past.  HS-TnI 245. Patient has ongoing chest pain in the ER.   General: NAD Neck: No JVD, no thyromegaly or thyroid nodule.  Lungs: Clear to auscultation bilaterally with normal respiratory effort. CV: Nondisplaced PMI.  Heart regular S1/S2, no S3/S4, no murmur.  No peripheral edema.   Abdomen: Soft, nontender, no hepatosplenomegaly, no distention.  Skin: Intact without lesions or rashes.  Neurologic: Alert and oriented x 3.  Psych: Normal affect. Extremities: No clubbing or cyanosis.  HEENT: Normal.   1. CAD: Long h/o CAD with CABG x 4 in 5/22.  Now back with NSTEMI with HS-TnI 245.  He has had symptoms on and off since Sunday, uncertain if TnI is on the uptrend or downtrend.  - Continue ASA and atorvastatin 80.  - Add heparin gtt - With ongoing CP, needs cath today for possible PCI, I have arranged.  Discussed risks/benefits with patient and he agrees to procedure.  2. Chronic systolic CHF: Ischemic cardiomyopathy.  Echo in 8/22 with EF still low at 25-30%, RV normal. GDMT has been limited by soft BP.  Has qualified for ICD but had wanted to discuss further.  I do not think that he is significantly volume overloaded.   - Will restart home HF meds post-cath if BP stable.  3. Hyperlipidemia: LDL under good control in 6/22.   Loralie Champagne 12/03/2020 2:40 PM

## 2020-12-03 NOTE — Interval H&P Note (Signed)
History and Physical Interval Note:  12/03/2020 2:42 PM  Wyatt Donovan  has presented today for surgery, with the diagnosis of chest pain.  The various methods of treatment have been discussed with the patient and family. After consideration of risks, benefits and other options for treatment, the patient has consented to  Procedure(s): LEFT HEART CATH AND CORS/GRAFTS ANGIOGRAPHY (N/A) as a surgical intervention.  The patient's history has been reviewed, patient examined, no change in status, stable for surgery.  I have reviewed the patient's chart and labs.  Questions were answered to the patient's satisfaction.   Cath Lab Visit (complete for each Cath Lab visit)  Clinical Evaluation Leading to the Procedure:   ACS: Yes.    Non-ACS:    Anginal Classification: CCS IV  Anti-ischemic medical therapy: Maximal Therapy (2 or more classes of medications)  Non-Invasive Test Results: No non-invasive testing performed  Prior CABG: Previous CABG        Collier [facility] 12/03/2020 2:43 PM

## 2020-12-04 ENCOUNTER — Other Ambulatory Visit (HOSPITAL_COMMUNITY): Payer: Self-pay

## 2020-12-04 ENCOUNTER — Encounter (HOSPITAL_COMMUNITY): Payer: Self-pay | Admitting: Cardiology

## 2020-12-04 ENCOUNTER — Inpatient Hospital Stay (HOSPITAL_COMMUNITY): Payer: Medicaid Other

## 2020-12-04 DIAGNOSIS — I5022 Chronic systolic (congestive) heart failure: Secondary | ICD-10-CM | POA: Diagnosis not present

## 2020-12-04 DIAGNOSIS — I214 Non-ST elevation (NSTEMI) myocardial infarction: Secondary | ICD-10-CM | POA: Diagnosis not present

## 2020-12-04 LAB — CBC
HCT: 47.2 % (ref 39.0–52.0)
Hemoglobin: 14.7 g/dL (ref 13.0–17.0)
MCH: 28 pg (ref 26.0–34.0)
MCHC: 31.1 g/dL (ref 30.0–36.0)
MCV: 89.9 fL (ref 80.0–100.0)
Platelets: 234 10*3/uL (ref 150–400)
RBC: 5.25 MIL/uL (ref 4.22–5.81)
RDW: 17.4 % — ABNORMAL HIGH (ref 11.5–15.5)
WBC: 7.9 10*3/uL (ref 4.0–10.5)
nRBC: 0 % (ref 0.0–0.2)

## 2020-12-04 LAB — BASIC METABOLIC PANEL
Anion gap: 4 — ABNORMAL LOW (ref 5–15)
Anion gap: 8 (ref 5–15)
Anion gap: 5 (ref 5–15)
BUN: 17 mg/dL (ref 6–20)
BUN: 16 mg/dL (ref 6–20)
BUN: 16 mg/dL (ref 6–20)
CO2: 26 mmol/L (ref 22–32)
CO2: 26 mmol/L (ref 22–32)
CO2: 27 mmol/L (ref 22–32)
Calcium: 8.7 mg/dL — ABNORMAL LOW (ref 8.9–10.3)
Calcium: 9.3 mg/dL (ref 8.9–10.3)
Calcium: 8.6 mg/dL — ABNORMAL LOW (ref 8.9–10.3)
Chloride: 108 mmol/L (ref 98–111)
Chloride: 107 mmol/L (ref 98–111)
Chloride: 107 mmol/L (ref 98–111)
Creatinine, Ser: 1.06 mg/dL (ref 0.61–1.24)
Creatinine, Ser: 0.95 mg/dL (ref 0.61–1.24)
Creatinine, Ser: 0.97 mg/dL (ref 0.61–1.24)
GFR, Estimated: >60 mL/min (ref >60)
GFR, Estimated: >60 mL/min (ref >60)
GFR, Estimated: >60 mL/min (ref >60)
Glucose, Bld: 88 mg/dL (ref 70–99)
Glucose, Bld: 86 mg/dL (ref 70–99)
Glucose, Bld: 102 mg/dL — ABNORMAL HIGH (ref 70–99)
Potassium: 5.3 mmol/L — ABNORMAL HIGH (ref 3.5–5.1)
Potassium: 6.1 mmol/L — ABNORMAL HIGH (ref 3.5–5.1)
Potassium: 5.5 mmol/L — ABNORMAL HIGH (ref 3.5–5.1)
Sodium: 138 mmol/L (ref 135–145)
Sodium: 141 mmol/L (ref 135–145)
Sodium: 139 mmol/L (ref 135–145)

## 2020-12-04 LAB — ECHOCARDIOGRAM COMPLETE
Area-P 1/2: 7.51 cm2
Height: 73 in
S' Lateral: 4.3 cm
Single Plane A4C EF: 37.6 %
Weight: 2854.4 oz

## 2020-12-04 LAB — LIPID PANEL
Cholesterol: 94 mg/dL (ref 0–200)
HDL: 25 mg/dL — ABNORMAL LOW (ref >40)
LDL Cholesterol: 45 mg/dL (ref 0–99)
Total CHOL/HDL Ratio: 3.8 RATIO
Triglycerides: 118 mg/dL (ref <150)
VLDL: 24 mg/dL (ref 0–40)

## 2020-12-04 MED ORDER — TICAGRELOR 90 MG PO TABS
90.0000 mg | ORAL_TABLET | Freq: Two times a day (BID) | ORAL | Status: DC
  Administered 2020-12-04 – 2020-12-05 (×3): 90 mg via ORAL
  Filled 2020-12-04 (×2): qty 1

## 2020-12-04 MED ORDER — TICAGRELOR 90 MG PO TABS
90.0000 mg | ORAL_TABLET | Freq: Two times a day (BID) | ORAL | 11 refills | Status: DC
  Filled 2020-12-04: qty 60, 30d supply, fill #0

## 2020-12-04 MED ORDER — CARVEDILOL 3.125 MG PO TABS
3.1250 mg | ORAL_TABLET | Freq: Two times a day (BID) | ORAL | Status: DC
  Administered 2020-12-04 – 2020-12-06 (×4): 3.125 mg via ORAL
  Filled 2020-12-04 (×4): qty 1

## 2020-12-04 MED ORDER — SODIUM ZIRCONIUM CYCLOSILICATE 10 G PO PACK
10.0000 g | PACK | Freq: Once | ORAL | Status: AC
  Administered 2020-12-05: 10 g via ORAL
  Filled 2020-12-04: qty 1

## 2020-12-04 MED ORDER — ASPIRIN 81 MG PO CHEW: 81.0000 mg | CHEWABLE_TABLET | ORAL | Status: DC

## 2020-12-04 MED ORDER — SODIUM ZIRCONIUM CYCLOSILICATE 10 G PO PACK
10.0000 g | PACK | Freq: Once | ORAL | Status: AC
  Administered 2020-12-04: 10 g via ORAL
  Filled 2020-12-04: qty 1

## 2020-12-04 MED ORDER — SODIUM CHLORIDE 0.9% FLUSH
3.0000 mL | INTRAVENOUS | Status: DC | PRN
Start: 2020-12-04 — End: 2020-12-04

## 2020-12-04 MED ORDER — SODIUM CHLORIDE 0.9 % IV SOLN
INTRAVENOUS | Status: DC
Start: 2020-12-05 — End: 2020-12-04

## 2020-12-04 MED ORDER — INFLUENZA VAC SPLIT QUAD 0.5 ML IM SUSY
0.5000 mL | PREFILLED_SYRINGE | INTRAMUSCULAR | Status: AC
  Administered 2020-12-04: 0.5 mL via INTRAMUSCULAR
  Filled 2020-12-04: qty 0.5

## 2020-12-04 MED ORDER — SODIUM CHLORIDE 0.9 % IV SOLN
250.0000 mL | INTRAVENOUS | Status: DC | PRN
Start: 2020-12-04 — End: 2020-12-04

## 2020-12-04 NOTE — Progress Notes (Signed)
K 6.1 on repeat BMP. Geannie Risen PA notified. Order for Lokelma po x1  and repeat BMP at 1800hrs.

## 2020-12-04 NOTE — Discharge Summary (Addendum)
Advanced Heart Failure Team  Discharge Summary   Patient ID: Wyatt Donovan MRN: [id number], DOB/AGE: 03-05-1985 35 y.o. Admit date: 12/03/2020 D/C date:     12/06/2020   Primary Discharge Diagnoses:  CAD/NSTEMI Chronic systolic HF LV thrombus  Hyperkalemia Atrial fibrillation with RVR   Hospital Course:  Wyatt Donovan is a 35 y.o. male with history of early onset CAD s/p CABG, ischemic cardiomyopathy, and LV thrombus. Patient had initial MI in 2012 at age 76, PCI to LAD.  He had inferoposterior MI in 4/22.  LHC showed 3 vessel disease, and patient had CABG x 4. Cardiac MRI in 5/22 showed LV EF 27% with LV thrombus, there was significant viability.  Post-op, patient had GI bleeding and anticoagulation was stopped.  He quit smoking after CABG.    Follow up 10/04/20 carvedilol decreased due to dizziness and repeat echo arranged.   Echo 8/22 EF 25-30%. He was referred to EP for ICD consideration. Awaiting initial consultation.  Seen for f/u 09/06 and spiro added.   Patient admitted on 10/04 with NSTEMI. HS troponin peaked at 263. Coronary angiogram demonstrated patent LIMA to LAD with occlusion of all other grafts. S/p PCI/DES to native mid LCX. Will need to continue DAPT with aspirin and ticagrelor X 1 year.  LVEDP 2 mmmHg. Given 1L fluid bolus. CHF medications held d/t hypotension.  Angina resolved post PCI. He ambulated the halls twice today without recurrent chest pain. BP improved. Coreg, Farxiga and Losartan added back. Stopped spiro d/t hyperkalemia. D/C'd imdur as he never started taking the medication.  His hospitalization was complicated by atrial fibrillation with RVR on 10/6. He received IV amiodarone and started on Eliquis.  On 10/7, he underwent DCCV with conversion to NSR.   See below for hospital course by problem.  Hospital Course by Problem: CAD/NSTEMI  - early onset CAD s/p MI with LAD PCI in 2012, followed by CABG  x 4 4/22  - Admit 10/04 with NSTEMI. HS troponin peaked  at 263. LHC yesterday with patent LIMA to LAD, all other grafts occluded. S/p PCIX DES mid Lcx - Feeling much better today. Angina resolved post PCI. - Continue DAPT for 1 year - on asa 81 mg daily and ticagrelor 90 BID.  - BP improved. Restarted Coreg prior to discharge.  - Stopped imdur as he never started taking it. - On 80 mg Atorvastatin. LDL 45 this admit. - Referral submitted for stage II cardiac rehab   2. Chronic Systolic HF - ICM, recent Echo 8/22 EF 25-30%. - LHC yesterday with LVEDP 2 mmHg. Given 1L fluid bolus.  - Final echo report pending at d/c - Euvolemic on exam  - Has been referred for ICD - outpatient EP consult scheduled later this month - GDMT initially held. BP improved after fluids. Restarted coreg 3.125 mg BID, Farxiga 10 mg daily and Losartan 12.5 mg daily at discharge. - K 5.3 this am with improvement to 4.2 on 10/7. Stopped spiro at discharge. May consider adding back at f/u but will need close monitoring.   3. LV thrombus - Noted on cMRI 05/22 - Anticoagulation previously stopped d/t GI bleed - No thrombus on echo 08/22   4. Hyperkalemia - K 5.3, today 4.2. Hx intermittent hyperkalemia on reviewing previously labs. - May limit future use of MRA  5. Atrial fibrillation with RVR - Noted on 10/6.  He received IV amiodarone and started on Eliquis - Underwent DCCV on 10/7 with conversion to NSR - continue amiodarone at  discharge    Discharge Vitals: Blood pressure 108/88, pulse 76, temperature 97.8 F (36.6 C), temperature source Oral, resp. rate 18, height 6\' 1"  (1.854 m), weight 80.7 kg, SpO2 100 %.  Labs: Lab Results  Component Value Date   WBC 7.4 12/06/2020   HGB 15.2 12/06/2020   HCT 46.7 12/06/2020   MCV 88.4 12/06/2020   PLT 227 12/06/2020    Recent Labs  Lab 12/06/20 0229  NA 139  K 4.2  CL 111  CO2 21*  BUN 22*  CREATININE 1.17  CALCIUM 8.9  GLUCOSE 99   Lab Results  Component Value Date   CHOL 94 12/04/2020   HDL 25 (L)  12/04/2020   LDLCALC 45 12/04/2020   TRIG 118 12/04/2020   BNP (last 3 results) Recent Labs    07/08/20 0338 10/04/20 1109 12/03/20 1203  BNP 1,344.0* 197.9* 122.1*    ProBNP (last 3 results) No results for input(s): PROBNP in the last 8760 hours.   Diagnostic Studies/Procedures   No results found.  Discharge Medications   Allergies as of 12/06/2020       Reactions   Codeine Itching        Medication List     STOP taking these medications    isosorbide mononitrate 10 MG tablet Commonly known as: ISMO   losartan 25 MG tablet Commonly known as: COZAAR   spironolactone 25 MG tablet Commonly known as: ALDACTONE       TAKE these medications    acetaminophen 500 MG tablet Commonly known as: TYLENOL Take 1,000 mg by mouth every 6 (six) hours as needed for mild pain or headache.   amiodarone 200 MG tablet Commonly known as: PACERONE Take 1 tablet (200 mg total) by mouth 2 (two) times daily.   apixaban 5 MG Tabs tablet Commonly known as: ELIQUIS Take 1 tablet (5 mg total) by mouth 2 (two) times daily.   aspirin 81 MG EC tablet Take 1 tablet (81 mg total) by mouth daily. What changed: additional instructions   atorvastatin 80 MG tablet Commonly known as: LIPITOR TAKE 1 Tablet BY MOUTH ONCE DAILY   carvedilol 3.125 MG tablet Commonly known as: COREG Take 1 tablet (3.125 mg total) by mouth 2 (two) times daily with a meal.   clopidogrel 75 MG tablet Commonly known as: PLAVIX Take 1 tablet (75 mg total) by mouth daily. Start taking on: December 07, 2020   Farxiga 10 MG Tabs tablet Generic drug: dapagliflozin propanediol Take 1 tablet (10 mg total) by mouth daily before breakfast.   nitroGLYCERIN 0.4 MG SL tablet Commonly known as: NITROSTAT Place 0.4 mg under the tongue every 5 (five) minutes as needed for chest pain.        Disposition   The patient will be discharged in stable condition to home. Discharge Instructions     (HEART FAILURE  PATIENTS) Call MD:  Anytime you have any of the following symptoms: 1) 3 pound weight gain in 24 hours or 5 pounds in 1 week 2) shortness of breath, with or without a dry hacking cough 3) swelling in the hands, feet or stomach 4) if you have to sleep on extra pillows at night in order to breathe.   Complete by: As directed    (HEART FAILURE PATIENTS) Call MD:  Anytime you have any of the following symptoms: 1) 3 pound weight gain in 24 hours or 5 pounds in 1 week 2) shortness of breath, with or without a dry hacking cough 3)  swelling in the hands, feet or stomach 4) if you have to sleep on extra pillows at night in order to breathe.   Complete by: As directed    Amb Referral to Cardiac Rehabilitation   Complete by: As directed    Diagnosis:  Coronary Stents NSTEMI PTCA     After initial evaluation and assessments completed: Virtual Based Care may be provided alone or in conjunction with Phase 2 Cardiac Rehab based on patient barriers.: Yes   Call MD for:  redness, tenderness, or signs of infection (pain, swelling, redness, odor or green/yellow discharge around incision site)   Complete by: As directed    Diet - low sodium heart healthy   Complete by: As directed    Diet - low sodium heart healthy   Complete by: As directed    Heart Failure patients record your daily weight using the same scale at the same time of day   Complete by: As directed    Heart Failure patients record your daily weight using the same scale at the same time of day   Complete by: As directed    Increase activity slowly   Complete by: As directed    Increase activity slowly   Complete by: As directed    STOP any activity that causes chest pain, shortness of breath, dizziness, sweating, or exessive weakness   Complete by: As directed        Follow-up Information     [facility] Follow up on 12/11/2020.   Specialty: Cardiology Why: Advanced Heart Failure Clinic at 3:30  pm Entrance C, Garage Code [id number] Contact information: [address] [id number] [place] [phone]                  Duration of Discharge Encounter: Greater than 35 minutes   Altamease Oiler  12/06/2020, 2:45 PM   Patient seen with NP, agree with the above note.   Patient is stable for discharge today.  He will go home on the above meds with CHF clinic followup.    Loralie Champagne 12/08/2020

## 2020-12-04 NOTE — TOC Benefit Eligibility Note (Signed)
Patient Teacher, English as a foreign language completed.    The patient is currently admitted and upon discharge could be taking Brilinta 90 mg.  The current 30 day co-pay is, $0.00.   The patient is insured through [place], [place] Patient Advocate Specialist [employer] Antimicrobial Stewardship Team Direct Number: [phone]  Fax: [phone]

## 2020-12-04 NOTE — Progress Notes (Signed)
Notified of patient's K 6.1.  Cancelled discharge this afternoon.  Give lokelma 10g.  Check BMET this evening and tomorrow am.

## 2020-12-04 NOTE — Progress Notes (Signed)
  Echocardiogram 2D Echocardiogram has been performed.  Wyatt Donovan 12/04/2020, 2:06 PM

## 2020-12-04 NOTE — Progress Notes (Signed)
CARDIAC REHAB PHASE I   PRE:  Rate/Rhythm: 81 SR    BP: sitting 106/70    SaO2: 98 RA  MODE:  Ambulation: 870 ft   POST:  Rate/Rhythm: 115 ST    BP: sitting 112/89     SaO2: 98 RA  Tolerated well. Able to walk with his cane, no SOB or CP, feels much better.   Reviewed/discussed education including MI, stent, Brilinta importance, watching for fluid, exercise, NTG, and CRPII. He has made significant change at home (quit smoking, watches diet, walks daily). He is waiting on ICD before starting CRPII. Will place another referral for this admit to [facility]. [place], ACSM 12/04/2020 9:25 AM

## 2020-12-04 NOTE — Progress Notes (Addendum)
Advanced Heart Failure Rounding Note  PCP-Cardiologist: Loralie Champagne, MD   Subjective:   10/04: Admitted with NSTEMI. S/p PCI/DES native mid Lcx.   Given 1L fluid bolus post cath d/t hypotension and low fluid status  Very slight bump in Scr post cath. K 5.3.   SBP remains upper 90s/low 100s  Feeling much better today. Chest pain has resolved. No dyspnea. Mild tenderness at left groin site.   R/LHC, 12/03/20 Left dominant circulation Occluded LAD at prior stent site Patent LIMA to the LAD All other grafts are occluded including SVG to diagonal, radial to OM, and SVG to PDA Low LVEDP 2 mm Hg Successful PCI of the native mid LCx with DES x 1 Plan: DAPT for one year. Hold CHF meds now due to low BP and low EDP. Will hydrate.    Objective:   Weight Range: 80.7 kg Body mass index is 23.48 kg/m.   Vital Signs:   Temp:  [97.6 F (36.4 C)-98.6 F (37 C)] 98.3 F (36.8 C) (10/05 0741) Pulse Rate:  [56-92] 68 (10/05 0741) Resp:  [6-23] 14 (10/05 0741) BP: (88-113)/(57-77) 103/71 (10/05 0741) SpO2:  [98 %-100 %] 100 % (10/04 2015) Weight:  [80.7 kg] 80.7 kg (10/04 1152)    Weight change: Filed Weights   12/03/20 1152  Weight: 80.7 kg    Intake/Output:   Intake/Output Summary (Last 24 hours) at 12/04/2020 0745 Last data filed at 12/04/2020 0300 Gross per 24 hour  Intake 1331.37 ml  Output --  Net 1331.37 ml      Physical Exam    General:  Well appearing. No resp difficulty HEENT: Normal Neck: Supple. No JVD . Carotids 2+ bilat; no bruits. No lymphadenopathy or thyromegaly appreciated. Cor: PMI nondisplaced. Regular rate & rhythm. No rubs, gallops or murmurs. Lungs: Clear Abdomen: Soft, nontender, nondistended. No hepatosplenomegaly. No bruits or masses. Good bowel sounds. Extremities: No cyanosis, clubbing, rash, edema. Left groin site without hematoma. Neuro: Alert & orientedx3, cranial nerves grossly intact. moves all 4 extremities w/o difficulty.  Affect pleasant   Telemetry   NSR 70s-80s  EKG    Sinus 72 bpm, 1st degree AVB, inferior Q waves, QRS 120 ms  Labs    CBC Recent Labs    12/03/20 1851 12/04/20 0137  WBC 7.5 7.9  HGB 14.6 14.7  HCT 47.1 47.2  MCV 89.9 89.9  PLT 255 562   Basic Metabolic Panel Recent Labs    12/03/20 1203 12/03/20 1851 12/04/20 0137  NA 140  --  138  K 4.0  --  5.3*  CL 104  --  108  CO2 28  --  26  GLUCOSE 87  --  88  BUN 16  --  17  CREATININE 0.90 0.84 1.06  CALCIUM 9.8  --  8.7*   Liver Function Tests No results for input(s): AST, ALT, ALKPHOS, BILITOT, PROT, ALBUMIN in the last 72 hours. No results for input(s): LIPASE, AMYLASE in the last 72 hours. Cardiac Enzymes No results for input(s): CKTOTAL, CKMB, CKMBINDEX, TROPONINI in the last 72 hours.  BNP: BNP (last 3 results) Recent Labs    07/08/20 0338 10/04/20 1109 12/03/20 1203  BNP 1,344.0* 197.9* 122.1*    ProBNP (last 3 results) No results for input(s): PROBNP in the last 8760 hours.   D-Dimer No results for input(s): DDIMER in the last 72 hours. Hemoglobin A1C No results for input(s): HGBA1C in the last 72 hours. Fasting Lipid Panel Recent Labs  12/04/20 0137  CHOL 94  HDL 25*  LDLCALC 45  TRIG 118  CHOLHDL 3.8   Thyroid Function Tests No results for input(s): TSH, T4TOTAL, T3FREE, THYROIDAB in the last 72 hours.  Invalid input(s): FREET3  Other results:   Imaging    DG Chest 1 View  Result Date: 12/03/2020 CLINICAL DATA:  Pt states chest pain x 1 week HX: former smoker, HTN EXAM: CHEST  1 VIEW COMPARISON:  08/09/2020. FINDINGS: Stable changes from prior CABG surgery. Cardiac silhouette is normal in size. Normal mediastinal and hilar contours. Clear lungs.  No pleural effusion or pneumothorax. Skeletal structures are grossly intact. IMPRESSION: No active disease. Electronically Signed   By: Lajean Manes M.D.   On: 12/03/2020 12:56   CARDIAC CATHETERIZATION  Result Date: 12/03/2020    Prox LAD to Mid LAD lesion is 100% stenosed.   Lat 1st Diag lesion is 100% stenosed.   Prox Cx to Mid Cx lesion is 99% stenosed.   Ost RCA to Prox RCA lesion is 100% stenosed.   Mid LAD to Dist LAD lesion is 50% stenosed.   Origin to Prox Graft lesion is 100% stenosed.   Origin lesion is 100% stenosed.   Origin lesion is 100% stenosed.   A drug-eluting stent was successfully placed using a STENT ONYX FRONTIER 3.0X15.   Post intervention, there is a 0% residual stenosis.   LIMA and is normal in caliber.   Right radial artery graft was visualized by angiography.   SVG graft was visualized by angiography.   The graft exhibits no disease.   LV end diastolic pressure is normal. Left dominant circulation Occluded LAD at prior stent site Patent LIMA to the LAD All other grafts are occluded including SVG to diagonal, radial to OM, and SVG to PDA Low LVEDP 2 mm Hg Successful PCI of the native mid LCx with DES x 1 Plan: DAPT for one year. Hold CHF meds now due to low BP and low EDP. Will hydrate.     Medications:     Scheduled Medications:  aspirin EC  81 mg Oral Daily   atorvastatin  80 mg Oral Daily   dapagliflozin propanediol  10 mg Oral QAC breakfast   enoxaparin (LOVENOX) injection  40 mg Subcutaneous Q24H   ticagrelor  90 mg Oral BID    Infusions:   PRN Medications: acetaminophen, ondansetron (ZOFRAN) IV    Patient Profile   Mr. Wyatt Donovan is a 35 y.o. male with premature CAD s/p prior PCI to LAD and CABG in 16/07, chronic systolic HF with EF 37-10% now admitted for NSTEMI.  Assessment/Plan  CAD/NSTEMI  - early onset CAD s/p MI with LAD PCI in 2012, followed by CABG  x 4 4/22  - Admit 10/04 with NSTEMI. HS troponin peaked at 263. LHC yesterday with patent LIMA to LAD, all other grafts occluded. S/p PCIX DES mid Lcx - Feeling much better today. Angina resolved post PCI. - Continue DAPT for 1 year - on asa 81 mg daily and ticagrelor 90 BID.  - Add back carvedilol when blood pressure allows  (on 3.125 mg BID PTA) - On 80 mg Atorvastatin. LDL 45 this admit.   2. Chronic Systolic HF - ICM, recent Echo 8/22 EF 25-30%. - LHC yesterday with LVEDP 2 mmHg. Given 1L fluid bolus.  - Awaiting repeat echo  - Euvolemic on exam  - Has been referred for ICD - outpatient EP consult scheduled later this month - Not sure how much can tolerate  titration of GDMT - history of orthostatic hypotension with titration in past - Beta blocker as above when BP allows - K 5.3. Monitor closely. Had been on 12.5 mg spiro and 12.5 mg losartan PTA. - Farxiga 10 mg added back   3. LV thrombus - Noted on cMRI 05/22 - Anticoagulation previously stopped d/t GI bleed - No thrombus on echo 08/22  4. Hyperkalemia - K 5.3 today. Hx intermittent hyperkalemia. - May limit future use of ARB/ARNI and MRA  Anticipate discharge home within the next day. Consult CR. Should ambulate without recurrent CP prior to going home.   Length of Stay: 1  [facility], [place], PA-C  12/04/2020, 7:45 AM  Advanced Heart Failure Team Pager [phone] (M-F; 7a - 5p)  Please contact [facility] for night-coverage after hours (5p -7a ) and weekends on [email or web address]  Patient seen with PA, agree with the above note.   No chest pain or dyspnea with walking.  K mildly elevated at 5.3 this morning.   General: NAD Neck: No JVD, no thyromegaly or thyroid nodule.  Lungs: Clear to auscultation bilaterally with normal respiratory effort. CV: Nondisplaced PMI.  Heart regular S1/S2, no S3/S4, no murmur.  No peripheral edema.   Abdomen: Soft, nontender, no hepatosplenomegaly, no distention.  Skin: Intact without lesions or rashes.  Neurologic: Alert and oriented x 3.  Psych: Normal affect. Extremities: No clubbing or cyanosis.  HEENT: Normal.   Patient had NSTEMI, cath showed SVGs and radial graft occluded ostially with patent LIMA-LAD.  DES to proximal native LCx.  Occluded nondominant RCA.  No further chest pain.  - Continue statin,  good LDL.  - Continue ASA and Brilinta.   Volume status looks ok.  SBP at baseline currently, 100s-110s.  We can restart his home Farxiga 10 mg daily, Coreg 3.125 mg bid, and losartan 12.5 mg daily.  Will start spironolactone back if repeat K is not elevated.  He will need a BMET in about a week.   LV EF was < 35% prior to this NSTEMI.  I would recommend ICD.  He will need EP referral.    Close followup in CHF clinic.   Loralie Champagne 12/04/2020 2:08 PM

## 2020-12-04 NOTE — TOC Transition Note (Addendum)
Transition of Care [facility]) - CM/SW Discharge Note   Patient Details  Name: DAGOBERTO NEALY MRN: [id number] Date of Birth: Oct 09, 1985  Transition of Care [facility]) CM/SW Contact:  [place], [place] Phone Number: 12/04/2020, 2:57 PM   Clinical Narrative:    HF CSW spoke with Mr. Toledo at bedside to complete a brief SDOH with him and he reported that the Medicaid came through but that he was denied for disability. Mr. Janowiak reported that they wouldn't even look at the disability application because he didn't have enough "credits" or years working so the application was never denied as it was never reviewed. Mr. Whidby reported that having Medicaid is helpful with medications. Mr. Vanwingerden reported that he has been going to his doctor's appointments with medication compliance and still living with his dad and utilizing [facility] transportation as needed. Mr. Senske nurse reported that the discharge summary is in and he will be discharging today. Mr. Yurkovich reported having transportation with his dad at discharge.  CSW will continue to follow through discharge.  Final next level of care: Home/Self Care Barriers to Discharge: No Barriers Identified   Patient Goals and CMS Choice Patient states their goals for this hospitalization and ongoing recovery are:: remain independent CMS Medicare.gov Compare Post Acute Care list provided to:: Patient    Discharge Placement                       Discharge Plan and Services In-house Referral: Clinical Social Work Discharge Planning Services: CM Consult                                 Social Determinants of Health (SDOH) Interventions Food Insecurity Interventions: Other (Comment) (has food stamps) Financial Strain Interventions: Development worker, community (already seen pt.) Housing Interventions: Intervention Not Indicated Transportation Interventions: Intervention Not Indicated, [facility]   Readmission Risk  Interventions Readmission Risk Prevention Plan 07/10/2020  Post Dischage Appt Complete  Medication Screening Complete  Transportation Screening Complete  Some recent data might be hidden      Carrieann Spielberg, MSW, LCSWA [phone] Heart Failure Social Worker

## 2020-12-04 NOTE — Progress Notes (Signed)
Mobility Specialist Progress Note:   12/04/20 1230  Mobility  Activity Ambulated in hall  Level of Assistance Standby assist, set-up cues, supervision of patient - no hands on  Assistive Device Cane  Distance Ambulated (ft) 465 ft  Mobility Ambulated with assistance in hallway  Mobility Response Tolerated well  Mobility performed by Mobility specialist  Bed Position Chair  $Mobility charge 1 Mobility   Pre- Mobility:  100 HR During Mobility: 105 HR Post Mobility:   104 HR  Pt received in bed willing to participate in mobility. Independent and asx throughout ambulation. Pt returned to chair with call bell in reach and all needs met.     Mobility Specialist Phone [phone]  

## 2020-12-04 NOTE — TOC Initial Note (Addendum)
Transition of Care [facility]) - Initial/Assessment Note    Patient Details  Name: Wyatt Donovan MRN: [id number] Date of Birth: Mar 19, 1985  Transition of Care [facility]) CM/SW Contact:    Erenest Rasher, RN Phone Number: [phone] 12/04/2020, 1:32 PM  Clinical Narrative:                  HF TOC CM spoke to pt. Lives in home with father. States uses [employer] to get to appts. States he uses cane at home. Will have meds sent up from [place] at dc. Will continue to follow for dc needs.   12/06/2020 326 pm HF TOC CM reviewed chart and meds will be delivered to room prior to dc. Pt's PCP, Dr Marlon Pel.     Expected Discharge Plan: Home/Self Care Barriers to Discharge: Continued Medical Work up   Patient Goals and CMS Choice Patient states their goals for this hospitalization and ongoing recovery are:: remain independent CMS Medicare.gov Compare Post Acute Care list provided to:: Patient    Expected Discharge Plan and Services Expected Discharge Plan: Home/Self Care In-house Referral: Clinical Social Work Discharge Planning Services: CM Consult   Living arrangements for the past 2 months: Single Family Home   Prior Living Arrangements/Services Living arrangements for the past 2 months: Single Family Home Lives with:: Parents Patient language and need for interpreter reviewed:: Yes        Need for Family Participation in Patient Care: No (Comment) Care giver support system in place?: No (comment) Current home services: DME (cane) Criminal Activity/Legal Involvement Pertinent to Current Situation/Hospitalization: No - Comment as needed  Activities of Daily Living Home Assistive Devices/Equipment: None ADL Screening (condition at time of admission) Patient's cognitive ability adequate to safely complete daily activities?: Yes Is the patient deaf or have difficulty hearing?: No Does the patient have difficulty seeing, even when wearing glasses/contacts?:  No Does the patient have difficulty concentrating, remembering, or making decisions?: No Patient able to express need for assistance with ADLs?: Yes Does the patient have difficulty dressing or bathing?: No Independently performs ADLs?: Yes (appropriate for developmental age) Does the patient have difficulty walking or climbing stairs?: No Weakness of Legs: None Weakness of Arms/Hands: None  Permission Sought/Granted Permission sought to share information with : Case Manager, Family Supports, PCP Permission granted to share information with : Yes, Verbal Permission Granted  Share Information with NAME: Wyatt Donovan     Permission granted to share info w Relationship: father  Permission granted to share info w Contact Information: [phone]  Emotional Assessment   Attitude/Demeanor/Rapport: Gracious Affect (typically observed): Accepting Orientation: : Oriented to Self, Oriented to Place, Oriented to  Time, Oriented to Situation   Psych Involvement: No (comment)  Admission diagnosis:  NSTEMI (non-ST elevated myocardial infarction) [facility]) [I21.4] Chest pain [R07.9] Patient Active Problem List   Diagnosis Date Noted   Hypercholesterolemia 95/28/4132   Chronic systolic CHF (congestive heart failure) ([place]) 12/03/2020   NSTEMI (non-ST elevated myocardial infarction) (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   Coronary artery disease 07/03/2020   Non-ST elevation (NSTEMI) myocardial infarction [facility])    Chest pain at rest 06/29/2020   PCP:  Leonie Douglas, MD Pharmacy:   [facility], [place], [place] [address] [place] [address] Phone: [phone] Fax: [phone]  [place], [place] [address] Phone: [phone]  Fax: [phone]     Social Determinants of Health (SDOH) Interventions    Readmission Risk Interventions Readmission Risk  Prevention Plan 07/10/2020  Post Dischage Appt Complete  Medication Screening Complete  Transportation Screening Complete  Some recent data might be hidden

## 2020-12-04 NOTE — Plan of Care (Signed)

## 2020-12-05 ENCOUNTER — Inpatient Hospital Stay (HOSPITAL_COMMUNITY): Payer: Medicaid Other

## 2020-12-05 DIAGNOSIS — I5022 Chronic systolic (congestive) heart failure: Secondary | ICD-10-CM

## 2020-12-05 DIAGNOSIS — I214 Non-ST elevation (NSTEMI) myocardial infarction: Secondary | ICD-10-CM | POA: Diagnosis not present

## 2020-12-05 DIAGNOSIS — R000 Tachycardia, unspecified: Secondary | ICD-10-CM

## 2020-12-05 DIAGNOSIS — I429 Cardiomyopathy, unspecified: Secondary | ICD-10-CM

## 2020-12-05 LAB — BASIC METABOLIC PANEL
Anion gap: 6 (ref 5–15)
Anion gap: 8 (ref 5–15)
BUN: 15 mg/dL (ref 6–20)
BUN: 14 mg/dL (ref 6–20)
CO2: 26 mmol/L (ref 22–32)
CO2: 23 mmol/L (ref 22–32)
Calcium: 9 mg/dL (ref 8.9–10.3)
Calcium: 8.7 mg/dL — ABNORMAL LOW (ref 8.9–10.3)
Chloride: 108 mmol/L (ref 98–111)
Chloride: 111 mmol/L (ref 98–111)
Creatinine, Ser: 0.86 mg/dL (ref 0.61–1.24)
Creatinine, Ser: 0.76 mg/dL (ref 0.61–1.24)
GFR, Estimated: >60 mL/min (ref >60)
GFR, Estimated: >60 mL/min (ref >60)
Glucose, Bld: 87 mg/dL (ref 70–99)
Glucose, Bld: 97 mg/dL (ref 70–99)
Potassium: 6.2 mmol/L — ABNORMAL HIGH (ref 3.5–5.1)
Potassium: 3.7 mmol/L (ref 3.5–5.1)
Sodium: 140 mmol/L (ref 135–145)
Sodium: 142 mmol/L (ref 135–145)

## 2020-12-05 LAB — CK: Total CK: 281 U/L (ref 49–397)

## 2020-12-05 LAB — PROTIME-INR
INR: 1.2 (ref 0.8–1.2)
Prothrombin Time: 15.2 seconds (ref 11.4–15.2)

## 2020-12-05 IMAGING — MR MR CARD MORPHOLOGY WO/W CM
45 of 48 series · 45 of 48 positions shown · IV contrast (Gadavist)
Comparison: none

CLINICAL DATA: Cardiomyopathy evaluation

EXAM:
CARDIAC MRI
TECHNIQUE: The patient was scanned on a 1.5 Tesla Siemens magnet. A dedicated
cardiac coil was used. Functional imaging was done using Fiesta
sequences. [DATE], and 4 chamber views were done to assess for RWMA's.
Modified KNOLL rule using a short axis stack was used to
calculate an ejection fraction on a dedicated work station using
Circle software. The patient received 11 cc of Gadavist. After 10
minutes inversion recovery sequences were used to assess for
infiltration and scar tissue.
CONTRAST:  11 cc  of Gadavist

[Series 4: t2_haste_db_tra_bh · axial · 8.0mm · 1.41mm/px · 1 of 16 slices shown]
[im 1/16]
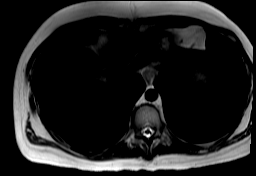

[Series 8: bSSFP · oblique · 8.0mm · 1.61mm/px · 1 of 25 slices shown (1 of 22)]
[im 1/25]
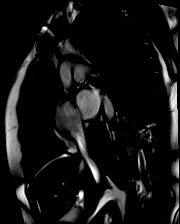

[Series 9: bSSFP · oblique · 8.0mm · 1.61mm/px · 1 of 25 slices shown (2 of 22)]
[im 1/25]
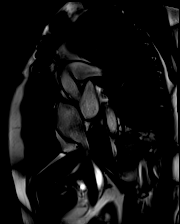

[Series 10: bSSFP · oblique · 8.0mm · 1.88mm/px · 1 of 25 slices shown (3 of 22)]
[im 1/25]
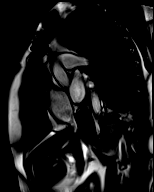

[Series 11: bSSFP · oblique · 8.0mm · 1.88mm/px · 1 of 25 slices shown (4 of 22)]
[im 1/25]
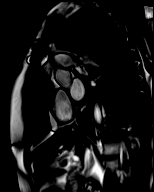

[Series 12: bSSFP · oblique · 8.0mm · 1.88mm/px · 1 of 25 slices shown (5 of 22)]
[im 1/25]
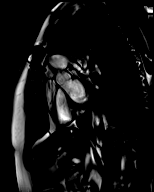

[Series 13: bSSFP · oblique · 8.0mm · 1.88mm/px · 1 of 25 slices shown (6 of 22)]
[im 1/25]
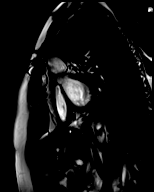

[Series 14: bSSFP · oblique · 8.0mm · 1.88mm/px · 1 of 25 slices shown (7 of 22)]
[im 1/25]
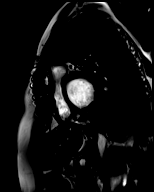

[Series 15: bSSFP · oblique · 8.0mm · 1.88mm/px · 1 of 25 slices shown (8 of 22)]
[im 1/25]
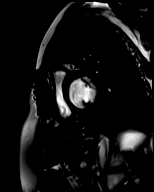

[Series 16: bSSFP · oblique · 8.0mm · 1.88mm/px · 1 of 25 slices shown (9 of 22)]
[im 1/25]
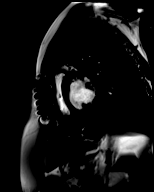

[Series 17: bSSFP · oblique · 8.0mm · 1.88mm/px · 1 of 25 slices shown (10 of 22)]
[im 1/25]
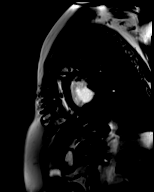

[Series 18: bSSFP · oblique · 8.0mm · 1.88mm/px · 1 of 25 slices shown (11 of 22)]
[im 1/25]
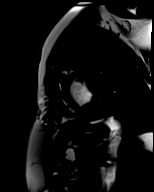

[Series 19: bSSFP · oblique · 8.0mm · 1.88mm/px · 1 of 25 slices shown (12 of 22)]
[im 1/25]
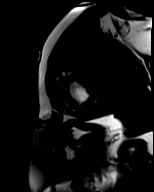

[Series 20: bSSFP · oblique · 8.0mm · 1.88mm/px · 1 of 25 slices shown (13 of 22)]
[im 1/25]
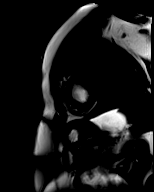

[Series 21: bSSFP · oblique · 8.0mm · 1.88mm/px · 1 of 25 slices shown (14 of 22)]
[im 1/25]
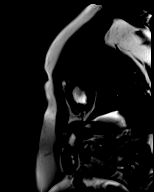

[Series 22: bSSFP · oblique · 8.0mm · 1.88mm/px · 1 of 25 slices shown (15 of 22)]
[im 1/25]
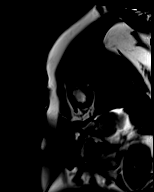

[Series 23: bSSFP · oblique · 8.0mm · 1.88mm/px · 1 of 25 slices shown (16 of 22)]
[im 1/25]
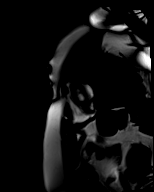

[Series 24: bSSFP · oblique · 8.0mm · 1.88mm/px · 1 of 25 slices shown (17 of 22)]
[im 1/25]
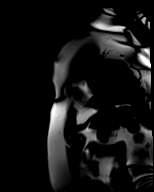

[Series 25: bSSFP · oblique · 8.0mm · 1.88mm/px · 1 of 25 slices shown (18 of 22)]
[im 1/25]
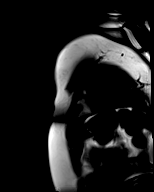

[Series 26: bSSFP · oblique · 8.0mm · 1.88mm/px · 1 of 25 slices shown (19 of 22)]
[im 1/25]
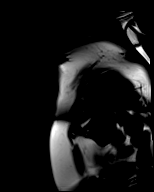

[Series 27: (id)_long_t1 · oblique · 8.0mm · 1.56mm/px · 1 of 24 slices shown]
[im 1/24]
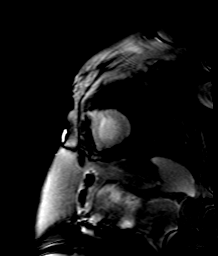

[Series 28: (id)_long_t1_moco · oblique · 8.0mm · 1.56mm/px · 1 of 24 slices shown]
[im 1/24]
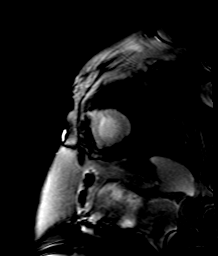

[Series 29: (id)_long_t1_moco_t1 · 1 of 3 slices shown (1 of 2)]
[im 1/3]
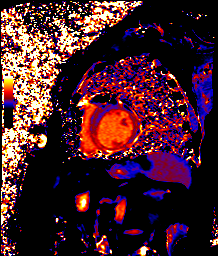

[Series 29: (id)_long_t1_moco_t1 · oblique · 8.0mm · 1.56mm/px · 1 of 3 slices shown (2 of 2)]
[im 1/3]
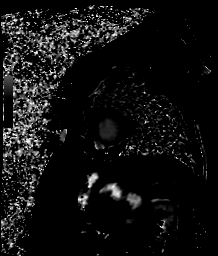

[Series 31: (id)_trufi · oblique · 8.0mm · 2.08mm/px · 1 of 9 slices shown]
[im 1/9]
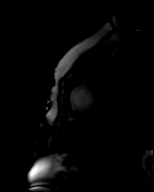

[Series 32: (id)_trufi_moco · oblique · 8.0mm · 2.08mm/px · 1 of 9 slices shown]
[im 1/9]
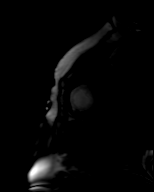

[Series 33: (id)_trufi_moco_t2 · oblique · 8.0mm · 2.08mm/px · 1 of 3 slices shown]
[im 1/3]
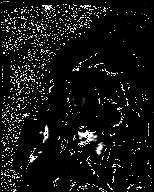

[Series 35: bSSFP · oblique · 6.0mm · 1.61mm/px · 1 of 25 slices shown (20 of 22)]
[im 1/25]
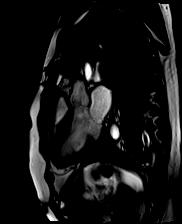

[Series 36: bSSFP · oblique · 6.0mm · 1.61mm/px · 1 of 25 slices shown (21 of 22)]
[im 1/25]
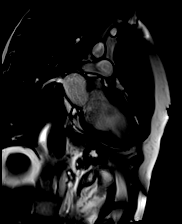

[Series 37: bSSFP · oblique · 6.0mm · 1.61mm/px · 1 of 25 slices shown (22 of 22)]
[im 1/25]
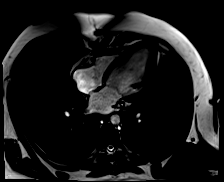

[Series 38: cine_trufi_cs_rt_short axis · oblique · 8.0mm · 1.73mm/px · 1 of 13 slices shown (1 of 15)]
[im 1/13]
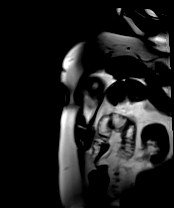

[Series 38: cine_trufi_cs_rt_short axis · oblique · 8.0mm · 1.73mm/px · 1 of 13 slices shown (2 of 15)]
[im 1/13]
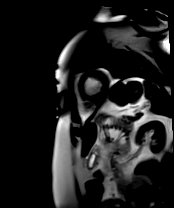

[Series 38: cine_trufi_cs_rt_short axis · oblique · 8.0mm · 1.73mm/px · 1 of 13 slices shown (3 of 15)]
[im 1/13]
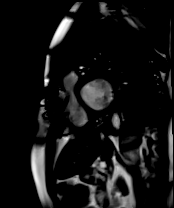

[Series 38: cine_trufi_cs_rt_short axis · oblique · 8.0mm · 1.73mm/px · 1 of 13 slices shown (4 of 15)]
[im 1/13]
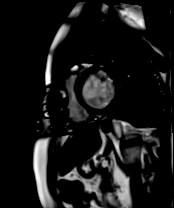

[Series 38: cine_trufi_cs_rt_short axis · oblique · 8.0mm · 1.73mm/px · 1 of 13 slices shown (5 of 15)]
[im 1/13]
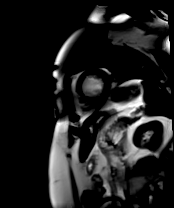

[Series 38: cine_trufi_cs_rt_short axis · oblique · 8.0mm · 1.73mm/px · 1 of 13 slices shown (6 of 15)]
[im 1/13]
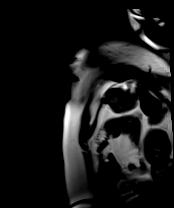

[Series 38: cine_trufi_cs_rt_short axis · oblique · 8.0mm · 1.73mm/px · 1 of 13 slices shown (7 of 15)]
[im 1/13]
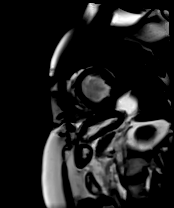

[Series 38: cine_trufi_cs_rt_short axis · oblique · 8.0mm · 1.73mm/px · 1 of 13 slices shown (8 of 15)]
[im 1/13]
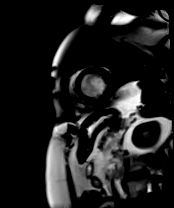

[Series 38: cine_trufi_cs_rt_short axis · oblique · 8.0mm · 1.73mm/px · 1 of 13 slices shown (9 of 15)]
[im 1/13]
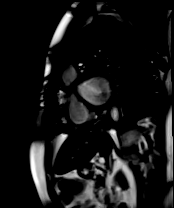

[Series 38: cine_trufi_cs_rt_short axis · oblique · 8.0mm · 1.73mm/px · 1 of 13 slices shown (10 of 15)]
[im 1/13]
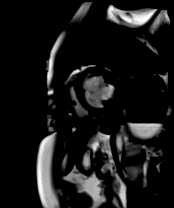

[Series 38: cine_trufi_cs_rt_short axis · oblique · 8.0mm · 1.73mm/px · 1 of 13 slices shown (11 of 15)]
[im 1/13]
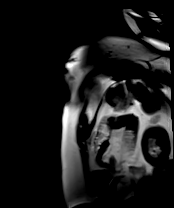

[Series 38: cine_trufi_cs_rt_short axis · oblique · 8.0mm · 1.73mm/px · 1 of 13 slices shown (12 of 15)]
[im 1/13]
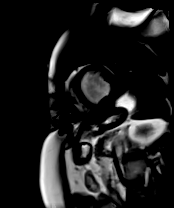

[Series 38: cine_trufi_cs_rt_short axis · oblique · 8.0mm · 1.73mm/px · 1 of 13 slices shown (13 of 15)]
[im 1/13]
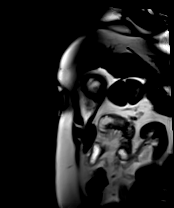

[Series 38: cine_trufi_cs_rt_short axis · oblique · 8.0mm · 1.73mm/px · 1 of 13 slices shown (14 of 15)]
[im 1/13]
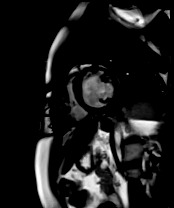

[Series 38: cine_trufi_cs_rt_short axis · oblique · 8.0mm · 1.73mm/px · 1 of 13 slices shown (15 of 15)]
[im 1/13]
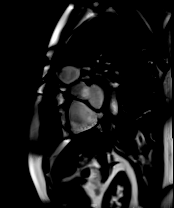

[45 of 48 positions shown; findings below may reference images not displayed]

FINDINGS: Left ventricle:

-Normal size

-Severe systolic dysfunction

-Elevated ECV (38%)

-No LV thrombus

-Subendocardial LGE in basal to mid
inferior/inferoseptal/inferolateral/anterolateral walls, apical
lateral/septal walls, and apex.

>50% transmural LGE in basal
inferior/inferoseptal/inferolateral/anterolateral, mid
inferior/inferolateral/anterolateral walls, apical lateral wall, and
distal apex

LV EF: 29% (Normal 56-78%)

Absolute volumes:

LV EDV: 188mL (Normal 77-195 mL)

LV ESV: 134mL (Normal 19-72 mL)

LV SV: 54mL (Normal 51-133 mL)

CO: 3.4L/min (Normal 2.8-8.8 L/min)

Indexed volumes:

LV EDV: 92mL/sq-m (Normal 47-92 mL/sq-m)

LV ESV: 66mL/sq-m (Normal 13-30 mL/sq-m)

LV SV: 26mL/sq-m (Normal 32-62 mL/sq-m)

CI: 1.7L/min/sq-m (Normal 1.7-4.2 L/min/sq-m)

Right ventricle: Small size with mild systolic dysfunction

RV EF:  45% (Normal 47-74%)

Absolute volumes:

RV EDV: 68mL (Normal 88-227 mL)

RV ESV: 38mL (Normal 23-103 mL)

RV SV: 30mL (Normal 52-138 mL)

CO: 1.9L/min (Normal 2.8-8.8 L/min)

Indexed volumes:

RV EDV: 33mL/sq-m (Normal 55-105 mL/sq-m)

RV ESV: 18mL/sq-m (Normal 15-43 mL/sq-m)

RV SV: 15mL/sq-m (Normal 32-64 mL/sq-m)

CI: 0.9L/min/sq-m (Normal 1.7-4.2 L/min/sq-m)

Left atrium: Normal size

Right atrium: Normal size

Mitral valve: Trivial regurgitation

Aortic valve: No regurgitation

Tricuspid valve: Trivial regurgitation

Pulmonic valve: No regurgitation

Aorta: Normal proximal ascending aorta

Pericardium: Normal
IMPRESSION: 1. Subendocardial late gadolinium enhancement consistent with prior
infarct in basal to mid
inferior/inferoseptal/inferolateral/anterolateral walls, apical
lateral/septal walls, and apex. LGE is greater than 50% transmural
suggesting nonviability in basal
inferior/inferoseptal/inferolateral/anterolateral walls, mid
inferior/inferolateral/anterolateral walls, apical lateral wall, and
distal apex.

2.  Normal LV size with severe systolic dysfunction (EF 29%)

3.  Small RV size with mild systolic dysfunction (EF 45%)

4.  No LV thrombus seen

## 2020-12-05 MED ORDER — ADENOSINE 6 MG/2ML IV SOLN
INTRAVENOUS | Status: AC
  Administered 2020-12-05: 6 mg via INTRAVENOUS
  Filled 2020-12-05: qty 6

## 2020-12-05 MED ORDER — CALCIUM CARBONATE ANTACID 500 MG PO CHEW
1.0000 | CHEWABLE_TABLET | Freq: Two times a day (BID) | ORAL | Status: DC | PRN
  Administered 2020-12-05: 200 mg via ORAL
  Filled 2020-12-05: qty 1

## 2020-12-05 MED ORDER — AMIODARONE HCL IN DEXTROSE 360-4.14 MG/200ML-% IV SOLN
30.0000 mg/h | INTRAVENOUS | Status: DC
  Administered 2020-12-05 – 2020-12-06 (×3): 30 mg/h via INTRAVENOUS
  Filled 2020-12-05: qty 200

## 2020-12-05 MED ORDER — AMIODARONE LOAD VIA INFUSION
150.0000 mg | Freq: Once | INTRAVENOUS | Status: AC
  Administered 2020-12-05: 150 mg via INTRAVENOUS
  Filled 2020-12-05: qty 83.34

## 2020-12-05 MED ORDER — AMIODARONE HCL IN DEXTROSE 360-4.14 MG/200ML-% IV SOLN
INTRAVENOUS | Status: AC
  Filled 2020-12-05: qty 200

## 2020-12-05 MED ORDER — CLOPIDOGREL BISULFATE 75 MG PO TABS
75.0000 mg | ORAL_TABLET | Freq: Every day | ORAL | Status: DC
  Administered 2020-12-06: 75 mg via ORAL
  Filled 2020-12-05: qty 1

## 2020-12-05 MED ORDER — GADOBUTROL 1 MMOL/ML IV SOLN
11.0000 mL | Freq: Once | INTRAVENOUS | Status: AC | PRN
  Administered 2020-12-05: 11 mL via INTRAVENOUS

## 2020-12-05 MED ORDER — DAPAGLIFLOZIN PROPANEDIOL 10 MG PO TABS
10.0000 mg | ORAL_TABLET | Freq: Every day | ORAL | Status: DC
  Administered 2020-12-05: 10 mg via ORAL
  Filled 2020-12-05: qty 1

## 2020-12-05 MED ORDER — ADENOSINE 6 MG/2ML IV SOLN: 6.0000 mg | Freq: Once | INTRAVENOUS | Status: AC

## 2020-12-05 MED ORDER — CLOPIDOGREL BISULFATE 75 MG PO TABS
600.0000 mg | ORAL_TABLET | Freq: Once | ORAL | Status: AC
  Administered 2020-12-05: 600 mg via ORAL
  Filled 2020-12-05: qty 8

## 2020-12-05 MED ORDER — APIXABAN 5 MG PO TABS
5.0000 mg | ORAL_TABLET | Freq: Two times a day (BID) | ORAL | Status: DC
  Administered 2020-12-05 – 2020-12-06 (×3): 5 mg via ORAL
  Filled 2020-12-05 (×3): qty 1

## 2020-12-05 MED ORDER — AMIODARONE HCL IN DEXTROSE 360-4.14 MG/200ML-% IV SOLN
60.0000 mg/h | INTRAVENOUS | Status: AC
  Administered 2020-12-05 (×2): 60 mg/h via INTRAVENOUS
  Filled 2020-12-05 (×2): qty 200

## 2020-12-05 MED ORDER — SODIUM ZIRCONIUM CYCLOSILICATE 10 G PO PACK
10.0000 g | PACK | Freq: Once | ORAL | Status: AC
  Administered 2020-12-05: 10 g via ORAL
  Filled 2020-12-05: qty 1

## 2020-12-05 MED ORDER — SODIUM CHLORIDE 0.9 % IV BOLUS
1000.0000 mL | Freq: Once | INTRAVENOUS | Status: AC
  Administered 2020-12-05: 1000 mL via INTRAVENOUS

## 2020-12-05 NOTE — Progress Notes (Signed)
    Called with K+ of 6.2 this morning. Elevated at 6.1 yesterday improved to 5.5 last evening after dose of lokelma. Will re-dose lokelma 10mg  x1 this morning. Stop farxiga in the setting of hyperkalemia for now. Will update rounding team.   Signed, Reino Bellis, NP-C 12/05/2020, 7:16 AM Pager: [phone]

## 2020-12-05 NOTE — H&P (View-Only) (Signed)
Advanced Heart Failure Rounding Note  PCP-Cardiologist: Loralie Champagne, MD   Subjective:   10/04: Admitted with NSTEMI. S/p PCI/DES native mid Lcx.  10/05: Cancelled discharge home d/t hyperkalemia  Given 1L fluid bolus post cath d/t hypotension and low fluid status  Scr up to 6.1 yesterday. Improved to 5.5 with Lokelma 10 g. Given lokelma again overnight.   K 6.2 on am labs. Getting 3rd dose of Lokelma. Scr stable.   Feels well. No recurrent angina.    R/LHC, 12/03/20 Left dominant circulation Occluded LAD at prior stent site Patent LIMA to the LAD All other grafts are occluded including SVG to diagonal, radial to OM, and SVG to PDA Low LVEDP 2 mm Hg Successful PCI of the native mid LCx with DES x 1 Plan: DAPT for one year. Hold CHF meds now due to low BP and low EDP. Will hydrate.    Objective:   Weight Range: 80.7 kg Body mass index is 23.48 kg/m.   Vital Signs:   Temp:  [98.1 F (36.7 C)-98.4 F (36.9 C)] 98.3 F (36.8 C) (10/06 0447) Pulse Rate:  [60-95] 67 (10/06 0755) Resp:  [13-19] 16 (10/06 0755) BP: (94-120)/(58-81) 110/77 (10/06 0755) SpO2:  [95 %-96 %] 95 % (10/06 0447) Last BM Date: 12/03/20  Weight change: Filed Weights   12/03/20 1152  Weight: 80.7 kg    Intake/Output:   Intake/Output Summary (Last 24 hours) at 12/05/2020 0808 Last data filed at 12/05/2020 0753 Gross per 24 hour  Intake 600 ml  Output 2000 ml  Net -1400 ml      Physical Exam    General:  Well appearing. No resp difficulty HEENT: normal Neck: supple. no JVD. Carotids 2+ bilat; no bruits. No lymphadenopathy or thryomegaly appreciated. Cor: PMI nondisplaced. Regular rate & rhythm. No rubs, gallops or murmurs. Lungs: clear Abdomen: soft, nontender, nondistended. No hepatosplenomegaly. No bruits or masses. Good bowel sounds. Extremities: no cyanosis, clubbing, rash, edema Neuro: alert & orientedx3, cranial nerves grossly intact. moves all 4 extremities w/o difficulty.  Affect pleasant    Telemetry   NSR 70s  EKG    No new  Labs    CBC Recent Labs    12/03/20 1851 12/04/20 0137  WBC 7.5 7.9  HGB 14.6 14.7  HCT 47.1 47.2  MCV 89.9 89.9  PLT 255 315   Basic Metabolic Panel Recent Labs    12/04/20 2051 12/05/20 0454  NA 139 140  K 5.5* 6.2*  CL 107 108  CO2 27 26  GLUCOSE 102* 87  BUN 16 15  CREATININE 0.97 0.86  CALCIUM 8.6* 9.0   Liver Function Tests No results for input(s): AST, ALT, ALKPHOS, BILITOT, PROT, ALBUMIN in the last 72 hours. No results for input(s): LIPASE, AMYLASE in the last 72 hours. Cardiac Enzymes No results for input(s): CKTOTAL, CKMB, CKMBINDEX, TROPONINI in the last 72 hours.  BNP: BNP (last 3 results) Recent Labs    07/08/20 0338 10/04/20 1109 12/03/20 1203  BNP 1,344.0* 197.9* 122.1*    ProBNP (last 3 results) No results for input(s): PROBNP in the last 8760 hours.   D-Dimer No results for input(s): DDIMER in the last 72 hours. Hemoglobin A1C No results for input(s): HGBA1C in the last 72 hours. Fasting Lipid Panel Recent Labs    12/04/20 0137  CHOL 94  HDL 25*  LDLCALC 45  TRIG 118  CHOLHDL 3.8   Thyroid Function Tests No results for input(s): TSH, T4TOTAL, T3FREE, THYROIDAB in the last  72 hours.  Invalid input(s): FREET3  Other results:   Imaging    ECHOCARDIOGRAM COMPLETE  Result Date: 12/04/2020    ECHOCARDIOGRAM REPORT   Patient Name:   Wyatt Donovan Date of Exam: 12/04/2020 Medical Rec #:  [id number]        Height:       73.0 in Accession #:    [id number]       Weight:       178.4 lb Date of Birth:  18-Jul-1985        BSA:          2.050 m Patient Age:    35 years         BP:           120/81 mmHg Patient Gender: M                HR:           77 bpm. Exam Location:  Inpatient Procedure: 2D Echo, Cardiac Doppler and Color Doppler Indications:    CHF  History:        Patient has prior history of Echocardiogram examinations, most                 recent 10/15/2020.  Cardiomyopathy, CAD and Previous Myocardial                 Infarction, Prior CABG, Signs/Symptoms:Chest Pain; Risk                 Factors:Dyslipidemia and Former Smoker. LV thrombus.  Sonographer:    Dustin Flock RDCS Referring Phys: [place]  1. No obvious apical thrombus in the region previously reported, however, this was a non-contrast study - if clinical concern, consider limited contrast echo. Left ventricular ejection fraction, by estimation, is 35 to 40%. The left ventricle has moderately decreased function. The left ventricle demonstrates regional wall motion abnormalities (see scoring diagram/findings for description). There is mild left ventricular hypertrophy. Left ventricular diastolic parameters are consistent with Grade I diastolic dysfunction (impaired relaxation). There is severe akinesis of the left ventricular, entire inferior wall and inferoseptal wall.  2. Right ventricular systolic function is mildly reduced. The right ventricular size is normal.  3. The mitral valve is abnormal. Trivial mitral valve regurgitation.  4. The aortic valve was not well visualized. Aortic valve regurgitation is not visualized.  5. The inferior vena cava is normal in size with greater than 50% respiratory variability, suggesting right atrial pressure of 3 mmHg. Comparison(s): Changes from prior study are noted. 10/15/2020: LVEF 40-45%, inferior and inferolateral hypokinesis, LV thrombus at the anteroapex. FINDINGS  Left Ventricle: No obvious apical thrombus in the region previously reported, however, this was a non-contrast study - if clinical concern, consider limited contrast echo. Left ventricular ejection fraction, by estimation, is 35 to 40%. The left ventricle has moderately decreased function. The left ventricle demonstrates regional wall motion abnormalities. Severe akinesis of the left ventricular, entire inferior wall and inferoseptal wall. The left ventricular internal  cavity size was normal in size. There is mild left ventricular hypertrophy. Left ventricular diastolic parameters are consistent with Grade I diastolic dysfunction (impaired relaxation). Indeterminate filling pressures. Right Ventricle: The right ventricular size is normal. No increase in right ventricular wall thickness. Right ventricular systolic function is mildly reduced. Left Atrium: Left atrial size was normal in size. Right Atrium: Right atrial size was normal in size. Pericardium: There is no evidence of pericardial effusion. Mitral Valve: The  mitral valve is abnormal. There is mild thickening of the mitral valve leaflet(s). There is mild calcification of the mitral valve leaflet(s). Trivial mitral valve regurgitation. Tricuspid Valve: The tricuspid valve is grossly normal. Tricuspid valve regurgitation is trivial. Aortic Valve: The aortic valve was not well visualized. Aortic valve regurgitation is not visualized. Pulmonic Valve: The pulmonic valve was not well visualized. Pulmonic valve regurgitation is not visualized. Aorta: The aortic root and ascending aorta are structurally normal, with no evidence of dilitation. Venous: The inferior vena cava is normal in size with greater than 50% respiratory variability, suggesting right atrial pressure of 3 mmHg. IAS/Shunts: No atrial level shunt detected by color flow Doppler.  LEFT VENTRICLE PLAX 2D LVIDd:         5.20 cm      Diastology LVIDs:         4.30 cm      LV e' medial:    7.83 cm/s LV PW:         1.10 cm      LV E/e' medial:  7.7 LV IVS:        1.00 cm      LV e' lateral:   6.85 cm/s LVOT diam:     2.30 cm      LV E/e' lateral: 8.8 LV SV:         47 LV SV Index:   23 LVOT Area:     4.15 cm  LV Volumes (MOD) LV vol d, MOD A4C: 129.0 ml LV vol s, MOD A4C: 80.5 ml LV SV MOD A4C:     129.0 ml RIGHT VENTRICLE RV Basal diam:  2.50 cm RV S prime:     7.29 cm/s TAPSE (M-mode): 1.4 cm LEFT ATRIUM             Index       RIGHT ATRIUM           Index LA diam:         3.20 cm 1.56 cm/m  RA Area:     11.20 cm LA Vol (A2C):   42.5 ml 20.74 ml/m RA Volume:   24.10 ml  11.76 ml/m LA Vol (A4C):   31.5 ml 15.37 ml/m LA Biplane Vol: 37.2 ml 18.15 ml/m  AORTIC VALVE LVOT Vmax:   70.50 cm/s LVOT Vmean:  45.900 cm/s LVOT VTI:    0.114 m  AORTA Ao Root diam: 3.30 cm MITRAL VALVE MV Area (PHT): 7.51 cm    SHUNTS MV Decel Time: 101 msec    Systemic VTI:  0.11 m MV E velocity: 60.00 cm/s  Systemic Diam: 2.30 cm MV A velocity: 57.40 cm/s MV E/A ratio:  1.05 Lyman Bishop MD Electronically signed by Lyman Bishop MD Signature Date/Time: 12/04/2020/2:50:19 PM    Final      Medications:     Scheduled Medications:  aspirin EC  81 mg Oral Daily   atorvastatin  80 mg Oral Daily   carvedilol  3.125 mg Oral BID WC   dapagliflozin propanediol  10 mg Oral Daily   enoxaparin (LOVENOX) injection  40 mg Subcutaneous Q24H   [COMPLETED] influenza vac split quadrivalent PF  0.5 mL Intramuscular Tomorrow-1000   ticagrelor  90 mg Oral BID    Infusions:   PRN Medications: acetaminophen, ondansetron (ZOFRAN) IV    Patient Profile   Mr. Wyatt Donovan is a 35 y.o. male with premature CAD s/p prior PCI to LAD and CABG in 22/29, chronic systolic HF with EF 79-89% now  admitted for NSTEMI.  Assessment/Plan  CAD/NSTEMI  - early onset CAD s/p MI with LAD PCI in 2012, followed by CABG  x 4 4/22  - Admit 10/04 with NSTEMI. HS troponin peaked at 263. LHC yesterday with patent LIMA to LAD, all other grafts occluded. S/p PCIX DES mid Lcx - Angina resolved post PCI - Continue DAPT for 1 year - on asa 81 mg daily and ticagrelor 90 BID.  - Continue coreg 3.125 mg BID - On 80 mg Atorvastatin. LDL 45 this admit.   2. Chronic Systolic HF - ICM, recent Echo 8/22 EF 25-30%. - LHC with LVEDP 2 mmHg. Given 1L fluid bolus.  - Repeat echo with improvement in EF to 35-40%. Akinesis inferior and inferoseptal walls. - Has been referred for ICD - outpatient EP consult scheduled later this month.  Will arrange for cMRI to more definitively assess LV function. Hopefully this can be done today.  - Appears euvolemic. Not requiring diuretics. - All HF meds initially held d/t low volume status and hypotension. Not sure how much can tolerate titration of GDMT - history of orthostatic hypotension with titration in past - Coreg restarted at 3.125 mg BID - Hold Farxiga (see below) - Holding Losartan and Spiro d/t hyperkalemia.  3. LV thrombus - Noted on cMRI 05/22 - Anticoagulation previously stopped d/t GI bleed - No thrombus on echo 08/22  4. Hyperkalemia - K 6.1 yesterday, received 2 doses of Lokelma. 6.2 this am. Just received 3rd dose.  - Has not received spiro or losartan since admit.  - Scr has been stable - Consulted Neurology. Appreciate input. Awaiting CK. No recent change in statin dosing. Holding [place] for now.   Length of Stay: 2  FINCH, LINDSAY N, PA-C  12/05/2020, 8:08 AM  Advanced Heart Failure Team Pager [phone] (M-F; 7a - 5p)  Please contact [facility] for night-coverage after hours (5p -7a ) and weekends on [email or web address]  Patient seen with PA, agree with the above note.    K remained high this morning despite stable creatinine, no K supplement, and no RAAS inhibition. He got another dose of Lokelma as well as 1 L NS and K down to 3.7 this afternoon.   Patient additionally went into atrial flutter with RVR which slowed with amiodarone gtt.  He currently remains in atrial flutter with rate in 80s.    Echo today showed EF 35-40% with some improvement, RV mildly decreased function.   General: NAD Neck: No JVD, no thyromegaly or thyroid nodule.  Lungs: Clear to auscultation bilaterally with normal respiratory effort. CV: Nondisplaced PMI.  Heart regular S1/S2, no S3/S4, no murmur.  No peripheral edema.   Abdomen: Soft, nontender, no hepatosplenomegaly, no distention.  Skin: Intact without lesions or rashes.  Neurologic: Alert and oriented x 3.  Psych: Normal  affect. Extremities: No clubbing or cyanosis.  HEENT: Normal.   Hyperkalemia difficult to explain.  Nephrology has seen, thinks that hypovolemia may play a role.  We held [place], hydrated him, and gave [facility].  K down to 3.5.   - Restart [place] tomorrow.  - Continue to hold losartan and spironolactone, hopefully can restart low dose losartan as outpatient.   Atrial flutter is a new diagnosis, started in hospital today.  - Continue amiodarone gtt.  - Start Eliquis 5 mg bid.  - Since < 48 hrs, if he remains in AFL tomorrow will arrange for DCCV.  - With addition of Eliquis, will transition antiplatelet regimen to Plavix (off ticagrelor).  Will continue ASA 81 x 1 month then stop.   Echo with EF just above ICD range.  Will arrange for cardiac MRI to confirm EF.   Loralie Champagne 12/05/2020 3:36 PM

## 2020-12-05 NOTE — Progress Notes (Signed)
   12/05/20 0916  Assess: MEWS Score  Temp 98 F (36.7 C)  BP 118/89  Pulse Rate (!) 145  ECG Heart Rate (!) 145  Resp (!) 24  Level of Consciousness Alert  SpO2 98 %  O2 Device Room Air  Assess: if the MEWS score is Yellow or Red  Were vital signs taken at a resting state? Yes  Focused Assessment Change from prior assessment (see assessment flowsheet)  Early Detection of Sepsis Score *See Row Information* Low  MEWS guidelines implemented *See Row Information* Yes  Treat  Pain Scale 0-10  Pain Score 5  Pain Type Acute pain  Pain Location Chest  Pain Orientation Mid  Pain Descriptors / Indicators Pressure  Take Vital Signs  Increase Vital Sign Frequency  Red: Q 1hr X 4 then Q 4hr X 4, if remains red, continue Q 4hrs  Escalate  MEWS: Escalate Red: discuss with charge nurse/RN and provider, consider discussing with RRT  Notify: Charge Nurse/RN  Name of Charge Nurse/RN Notified jessica lauer rn  Date Charge Nurse/RN Notified 12/05/20  Time Charge Nurse/RN Notified [id number]  Notify: Provider  Provider Name/Title Dr Quentin Ore and Marlyce Huge, PA  Date Provider Notified 12/05/20  Time Provider Notified [phone]  Notification Type Rounds  Notification Reason Change in status  Provider response In department;At bedside  Date of Provider Response 12/05/20  Time of Provider Response 0920  Notify: Rapid Response  Name of Rapid Response RN Notified Helle RN  Date Rapid Response Notified 12/05/20  Time Rapid Response Notified [id number]  Document  Patient Outcome Stabilized after interventions  Progress note created (see row info) Yes

## 2020-12-05 NOTE — Progress Notes (Addendum)
Patient taken for cardiac MRI with Chrissy RN at 1235hrs and returned to unit at 1518hrs.

## 2020-12-05 NOTE — Progress Notes (Addendum)
Advanced Heart Failure Rounding Note  PCP-Cardiologist: Loralie Champagne, MD   Subjective:   10/04: Admitted with NSTEMI. S/p PCI/DES native mid Lcx.  10/05: Cancelled discharge home d/t hyperkalemia  Given 1L fluid bolus post cath d/t hypotension and low fluid status  Scr up to 6.1 yesterday. Improved to 5.5 with Lokelma 10 g. Given lokelma again overnight.   K 6.2 on am labs. Getting 3rd dose of Lokelma. Scr stable.   Feels well. No recurrent angina.    R/LHC, 12/03/20 Left dominant circulation Occluded LAD at prior stent site Patent LIMA to the LAD All other grafts are occluded including SVG to diagonal, radial to OM, and SVG to PDA Low LVEDP 2 mm Hg Successful PCI of the native mid LCx with DES x 1 Plan: DAPT for one year. Hold CHF meds now due to low BP and low EDP. Will hydrate.    Objective:   Weight Range: 80.7 kg Body mass index is 23.48 kg/m.   Vital Signs:   Temp:  [98.1 F (36.7 C)-98.4 F (36.9 C)] 98.3 F (36.8 C) (10/06 0447) Pulse Rate:  [60-95] 67 (10/06 0755) Resp:  [13-19] 16 (10/06 0755) BP: (94-120)/(58-81) 110/77 (10/06 0755) SpO2:  [95 %-96 %] 95 % (10/06 0447) Last BM Date: 12/03/20  Weight change: Filed Weights   12/03/20 1152  Weight: 80.7 kg    Intake/Output:   Intake/Output Summary (Last 24 hours) at 12/05/2020 0808 Last data filed at 12/05/2020 0753 Gross per 24 hour  Intake 600 ml  Output 2000 ml  Net -1400 ml      Physical Exam    General:  Well appearing. No resp difficulty HEENT: normal Neck: supple. no JVD. Carotids 2+ bilat; no bruits. No lymphadenopathy or thryomegaly appreciated. Cor: PMI nondisplaced. Regular rate & rhythm. No rubs, gallops or murmurs. Lungs: clear Abdomen: soft, nontender, nondistended. No hepatosplenomegaly. No bruits or masses. Good bowel sounds. Extremities: no cyanosis, clubbing, rash, edema Neuro: alert & orientedx3, cranial nerves grossly intact. moves all 4 extremities w/o difficulty.  Affect pleasant    Telemetry   NSR 70s  EKG    No new  Labs    CBC Recent Labs    12/03/20 1851 12/04/20 0137  WBC 7.5 7.9  HGB 14.6 14.7  HCT 47.1 47.2  MCV 89.9 89.9  PLT 255 762   Basic Metabolic Panel Recent Labs    12/04/20 2051 12/05/20 0454  NA 139 140  K 5.5* 6.2*  CL 107 108  CO2 27 26  GLUCOSE 102* 87  BUN 16 15  CREATININE 0.97 0.86  CALCIUM 8.6* 9.0   Liver Function Tests No results for input(s): AST, ALT, ALKPHOS, BILITOT, PROT, ALBUMIN in the last 72 hours. No results for input(s): LIPASE, AMYLASE in the last 72 hours. Cardiac Enzymes No results for input(s): CKTOTAL, CKMB, CKMBINDEX, TROPONINI in the last 72 hours.  BNP: BNP (last 3 results) Recent Labs    07/08/20 0338 10/04/20 1109 12/03/20 1203  BNP 1,344.0* 197.9* 122.1*    ProBNP (last 3 results) No results for input(s): PROBNP in the last 8760 hours.   D-Dimer No results for input(s): DDIMER in the last 72 hours. Hemoglobin A1C No results for input(s): HGBA1C in the last 72 hours. Fasting Lipid Panel Recent Labs    12/04/20 0137  CHOL 94  HDL 25*  LDLCALC 45  TRIG 118  CHOLHDL 3.8   Thyroid Function Tests No results for input(s): TSH, T4TOTAL, T3FREE, THYROIDAB in the last  72 hours.  Invalid input(s): FREET3  Other results:   Imaging    ECHOCARDIOGRAM COMPLETE  Result Date: 12/04/2020    ECHOCARDIOGRAM REPORT   Patient Name:   Wyatt Donovan Date of Exam: 12/04/2020 Medical Rec #:  [id number]        Height:       73.0 in Accession #:    [id number]       Weight:       178.4 lb Date of Birth:  [birth year]        BSA:          2.050 m Patient Age:    35 years         BP:           120/81 mmHg Patient Gender: M                HR:           77 bpm. Exam Location:  Inpatient Procedure: 2D Echo, Cardiac Doppler and Color Doppler Indications:    CHF  History:        Patient has prior history of Echocardiogram examinations, most                 recent 10/15/2020.  Cardiomyopathy, CAD and Previous Myocardial                 Infarction, Prior CABG, Signs/Symptoms:Chest Pain; Risk                 Factors:Dyslipidemia and Former Smoker. LV thrombus.  Sonographer:    Dustin Flock RDCS Referring Phys: [place]  1. No obvious apical thrombus in the region previously reported, however, this was a non-contrast study - if clinical concern, consider limited contrast echo. Left ventricular ejection fraction, by estimation, is 35 to 40%. The left ventricle has moderately decreased function. The left ventricle demonstrates regional wall motion abnormalities (see scoring diagram/findings for description). There is mild left ventricular hypertrophy. Left ventricular diastolic parameters are consistent with Grade I diastolic dysfunction (impaired relaxation). There is severe akinesis of the left ventricular, entire inferior wall and inferoseptal wall.  2. Right ventricular systolic function is mildly reduced. The right ventricular size is normal.  3. The mitral valve is abnormal. Trivial mitral valve regurgitation.  4. The aortic valve was not well visualized. Aortic valve regurgitation is not visualized.  5. The inferior vena cava is normal in size with greater than 50% respiratory variability, suggesting right atrial pressure of 3 mmHg. Comparison(s): Changes from prior study are noted. 10/15/2020: LVEF 40-45%, inferior and inferolateral hypokinesis, LV thrombus at the anteroapex. FINDINGS  Left Ventricle: No obvious apical thrombus in the region previously reported, however, this was a non-contrast study - if clinical concern, consider limited contrast echo. Left ventricular ejection fraction, by estimation, is 35 to 40%. The left ventricle has moderately decreased function. The left ventricle demonstrates regional wall motion abnormalities. Severe akinesis of the left ventricular, entire inferior wall and inferoseptal wall. The left ventricular internal  cavity size was normal in size. There is mild left ventricular hypertrophy. Left ventricular diastolic parameters are consistent with Grade I diastolic dysfunction (impaired relaxation). Indeterminate filling pressures. Right Ventricle: The right ventricular size is normal. No increase in right ventricular wall thickness. Right ventricular systolic function is mildly reduced. Left Atrium: Left atrial size was normal in size. Right Atrium: Right atrial size was normal in size. Pericardium: There is no evidence of pericardial effusion. Mitral Valve: The  mitral valve is abnormal. There is mild thickening of the mitral valve leaflet(s). There is mild calcification of the mitral valve leaflet(s). Trivial mitral valve regurgitation. Tricuspid Valve: The tricuspid valve is grossly normal. Tricuspid valve regurgitation is trivial. Aortic Valve: The aortic valve was not well visualized. Aortic valve regurgitation is not visualized. Pulmonic Valve: The pulmonic valve was not well visualized. Pulmonic valve regurgitation is not visualized. Aorta: The aortic root and ascending aorta are structurally normal, with no evidence of dilitation. Venous: The inferior vena cava is normal in size with greater than 50% respiratory variability, suggesting right atrial pressure of 3 mmHg. IAS/Shunts: No atrial level shunt detected by color flow Doppler.  LEFT VENTRICLE PLAX 2D LVIDd:         5.20 cm      Diastology LVIDs:         4.30 cm      LV e' medial:    7.83 cm/s LV PW:         1.10 cm      LV E/e' medial:  7.7 LV IVS:        1.00 cm      LV e' lateral:   6.85 cm/s LVOT diam:     2.30 cm      LV E/e' lateral: 8.8 LV SV:         47 LV SV Index:   23 LVOT Area:     4.15 cm  LV Volumes (MOD) LV vol d, MOD A4C: 129.0 ml LV vol s, MOD A4C: 80.5 ml LV SV MOD A4C:     129.0 ml RIGHT VENTRICLE RV Basal diam:  2.50 cm RV S prime:     7.29 cm/s TAPSE (M-mode): 1.4 cm LEFT ATRIUM             Index       RIGHT ATRIUM           Index LA diam:         3.20 cm 1.56 cm/m  RA Area:     11.20 cm LA Vol (A2C):   42.5 ml 20.74 ml/m RA Volume:   24.10 ml  11.76 ml/m LA Vol (A4C):   31.5 ml 15.37 ml/m LA Biplane Vol: 37.2 ml 18.15 ml/m  AORTIC VALVE LVOT Vmax:   70.50 cm/s LVOT Vmean:  45.900 cm/s LVOT VTI:    0.114 m  AORTA Ao Root diam: 3.30 cm MITRAL VALVE MV Area (PHT): 7.51 cm    SHUNTS MV Decel Time: 101 msec    Systemic VTI:  0.11 m MV E velocity: 60.00 cm/s  Systemic Diam: 2.30 cm MV A velocity: 57.40 cm/s MV E/A ratio:  1.05 Lyman Bishop MD Electronically signed by Lyman Bishop MD Signature Date/Time: 12/04/2020/2:50:19 PM    Final      Medications:     Scheduled Medications:  aspirin EC  81 mg Oral Daily   atorvastatin  80 mg Oral Daily   carvedilol  3.125 mg Oral BID WC   dapagliflozin propanediol  10 mg Oral Daily   enoxaparin (LOVENOX) injection  40 mg Subcutaneous Q24H   [COMPLETED] influenza vac split quadrivalent PF  0.5 mL Intramuscular Tomorrow-1000   ticagrelor  90 mg Oral BID    Infusions:   PRN Medications: acetaminophen, ondansetron (ZOFRAN) IV    Patient Profile   Mr. Landess is a 35 y.o. 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  Assessment/Plan  CAD/NSTEMI  - early onset CAD s/p MI with LAD PCI in 2012, followed by CABG  x 4 4/22  - Admit 10/04 with NSTEMI. HS troponin peaked at 263. LHC yesterday with patent LIMA to LAD, all other grafts occluded. S/p PCIX DES mid Lcx - Angina resolved post PCI - Continue DAPT for 1 year - on asa 81 mg daily and ticagrelor 90 BID.  - Continue coreg 3.125 mg BID - On 80 mg Atorvastatin. LDL 45 this admit.   2. Chronic Systolic HF - ICM, recent Echo 8/22 EF 25-30%. - LHC with LVEDP 2 mmHg. Given 1L fluid bolus.  - Repeat echo with improvement in EF to 35-40%. Akinesis inferior and inferoseptal walls. - Has been referred for ICD - outpatient EP consult scheduled later this month.  Will arrange for cMRI to more definitively assess LV function. Hopefully this can be done today.  - Appears euvolemic. Not requiring diuretics. - All HF meds initially held d/t low volume status and hypotension. Not sure how much can tolerate titration of GDMT - history of orthostatic hypotension with titration in past - Coreg restarted at 3.125 mg BID - Hold Farxiga (see below) - Holding Losartan and Spiro d/t hyperkalemia.  3. LV thrombus - Noted on cMRI 05/22 - Anticoagulation previously stopped d/t GI bleed - No thrombus on echo 08/22  4. Hyperkalemia - K 6.1 yesterday, received 2 doses of Lokelma. 6.2 this am. Just received 3rd dose.  - Has not received spiro or losartan since admit.  - Scr has been stable - Consulted Neurology. Appreciate input. Awaiting CK. No recent change in statin dosing. Holding [place] for now.   Length of Stay: 2  FINCH, LINDSAY N, PA-C  12/05/2020, 8:08 AM  Advanced Heart Failure Team Pager [phone] (M-F; 7a - 5p)  Please contact [name] for night-coverage after hours (5p -7a ) and weekends on [email or web address]  Patient seen with PA, agree with the above note.    K remained high this morning despite stable creatinine, no K supplement, and no RAAS inhibition. He got another dose of Lokelma as well as 1 L NS and K down to 3.7 this afternoon.   Patient additionally went into atrial flutter with RVR which slowed with amiodarone gtt.  He currently remains in atrial flutter with rate in 80s.    Echo today showed EF 35-40% with some improvement, RV mildly decreased function.   General: NAD Neck: No JVD, no thyromegaly or thyroid nodule.  Lungs: Clear to auscultation bilaterally with normal respiratory effort. CV: Nondisplaced PMI.  Heart regular S1/S2, no S3/S4, no murmur.  No peripheral edema.   Abdomen: Soft, nontender, no hepatosplenomegaly, no distention.  Skin: Intact without lesions or rashes.  Neurologic: Alert and oriented x 3.  Psych: Normal  affect. Extremities: No clubbing or cyanosis.  HEENT: Normal.   Hyperkalemia difficult to explain.  Nephrology has seen, thinks that hypovolemia may play a role.  We held [place], hydrated him, and gave [facility].  K down to 3.5.   - Restart [place] tomorrow.  - Continue to hold losartan and spironolactone, hopefully can restart low dose losartan as outpatient.   Atrial flutter is a new diagnosis, started in hospital today.  - Continue amiodarone gtt.  - Start Eliquis 5 mg bid.  - Since < 48 hrs, if he remains in AFL tomorrow will arrange for DCCV.  - With addition of Eliquis, will transition antiplatelet regimen to Plavix (off ticagrelor).  Will continue ASA 81 x 1 month then stop.   Echo with EF just above ICD range.  Will arrange for cardiac MRI to confirm EF.   Loralie Champagne 12/05/2020 3:36 PM

## 2020-12-05 NOTE — Consult Note (Signed)
[facility]  INPATIENT CONSULTATION  Reason for Consultation: hyperkalemia Requesting Provider: [name]  HPI: Wyatt Donovan is an 35 y.o. male with premature CAD s/p CABG 2012, HFrEF, HL, h/o depression currently admitted for NSTEMI s/p PCI and nephrology is consulted re: eval and management of hyperkalemia.   Presented 10/4 with SSCP; labs with K 4, Bicarb 28, BUN 16, Cr 0.9, WBC 11, Hb 15.7 Plt 303.  Underwent LHC showing severe CAD of grafts; had PCI to native LCx with DES.  LVEDP was 65mmHg and BP was low so antiHTN meds were held and hydration recommended.   Following LHC his K has been noted to be high 5.3 > 6.1 > 5.5 > 6.2 despite 2+L UOP and being given lokelma 10g x 3 doses, including this AM.  He had K 5.9 in 06/2020 at clinic visit BP 90/76, spironolactone 25 so med reduced to 12.5 daily  Net I/Os for admission ~ even.  He was on heparin gtt initially, now on brilinta/ASA and SQ prophy lovenox.  No RAAS inhibition since presentation - was on aldactone 12.5 priot to admission. On farxiga 10mg  daily.  Atorvastatin 80 daily but no reports of myalgias; c/o claudication in calves with ambulation. No NSAIDs. No difficulty voiding.  Had mashed potatoes for dinner but doesn't sound like diet is [place].   Currently has gone into AFl this AM rates in the 140s and 5/10 CP.  Cardiology bedside and starting amiodarone.   PMH: Past Medical History:  Diagnosis Date   Atypical chest pain    CAD (coronary artery disease)    Dyslipidemia    Ex-smoker    History of depression    Insomnia    Ischemic cardiomyopathy    Ejection fraction 40-45%   NSTEMI (non-ST elevated myocardial infarction) ([place]) 08/2007   Treated with a bare metal stent to the proximal LAD   Suicide attempt ([place]) 01/2001   PSH: Past Surgical History:  Procedure Laterality Date   ADENOIDECTOMY     CORONARY ARTERY BYPASS GRAFT N/A 07/03/2020   Procedure: CORONARY ARTERY BYPASS GRAFTING (CABG) times four using  left internal mammary artery, right arm radial artery and right leg saphenous vein;  Surgeon: Lajuana Matte, MD;  Location: [facility];  Service: Open Heart Surgery;  Laterality: N/A;   CORONARY STENT INTERVENTION N/A 12/03/2020   Procedure: CORONARY STENT INTERVENTION;  Surgeon: Martinique, Peter M, MD;  Location: [facility];  Service: Cardiovascular;  Laterality: N/A;   CORONARY STENT PLACEMENT     Bare metal stent to proximal LAD   LEFT HEART CATH AND CORONARY ANGIOGRAPHY N/A 07/01/2020   Procedure: LEFT HEART CATH AND CORONARY ANGIOGRAPHY;  Surgeon: Lorretta Harp, MD;  Location: [facility];  Service: Cardiovascular;  Laterality: N/A;   LEFT HEART CATH AND CORS/GRAFTS ANGIOGRAPHY N/A 12/03/2020   Procedure: LEFT HEART CATH AND CORS/GRAFTS ANGIOGRAPHY;  Surgeon: Martinique, Peter M, MD;  Location: [facility];  Service: Cardiovascular;  Laterality: N/A;   RADIAL ARTERY HARVEST Right 07/03/2020   Procedure: RADIAL ARTERY HARVEST;  Surgeon: Lajuana Matte, MD;  Location: [facility];  Service: Open Heart Surgery;  Laterality: Right;   TEE WITHOUT CARDIOVERSION N/A 07/03/2020   Procedure: TRANSESOPHAGEAL ECHOCARDIOGRAM (TEE);  Surgeon: Lajuana Matte, MD;  Location: [facility];  Service: Open Heart Surgery;  Laterality: N/A;     Past Medical History:  Diagnosis Date   Atypical chest pain    CAD (coronary artery disease)  Dyslipidemia    Ex-smoker    History of depression    Insomnia    Ischemic cardiomyopathy    Ejection fraction 40-45%   NSTEMI (non-ST elevated myocardial infarction) ([place]) 08/2007   Treated with a bare metal stent to the proximal LAD   Suicide attempt ([place]) 01/2001    Medications:  I have reviewed the patient's current medications.   Medications Prior to Admission  Medication Sig Dispense Refill   acetaminophen (TYLENOL) 500 MG tablet Take 1,000 mg by mouth every 6 (six) hours as needed for mild pain or headache.     aspirin EC 81 MG tablet Take  81 mg by mouth daily. Swallow whole.     atorvastatin (LIPITOR) 80 MG tablet TAKE 1 Tablet BY MOUTH ONCE DAILY 30 tablet 2   carvedilol (COREG) 3.125 MG tablet Take 1 tablet (3.125 mg total) by mouth 2 (two) times daily with a meal. 180 tablet 3   dapagliflozin propanediol (FARXIGA) 10 MG TABS tablet Take 1 tablet (10 mg total) by mouth daily before breakfast. 30 tablet 2   losartan (COZAAR) 25 MG tablet Take 0.5 tablets (12.5 mg total) by mouth at bedtime. 45 tablet 3   nitroGLYCERIN (NITROSTAT) 0.4 MG SL tablet Place 0.4 mg under the tongue every 5 (five) minutes as needed for chest pain.     spironolactone (ALDACTONE) 25 MG tablet Take 0.5 tablets (12.5 mg total) by mouth daily. 45 tablet 3   isosorbide mononitrate (ISMO) 10 MG tablet Take 10 mg by mouth daily.      ALLERGIES:   Allergies  Allergen Reactions   Codeine Itching    FAM HX: Family History  Problem Relation Age of Onset   Hypertension Neg Hx    Diabetes Neg Hx    Coronary artery disease Neg Hx     Social History:   reports that he quit smoking about 5 months ago. His smoking use included cigarettes and cigars. He has a 22.00 pack-year smoking history. He has never used smokeless tobacco. He reports that he does not currently use drugs after having used the following drugs: Marijuana. He reports that he does not drink alcohol.  ROS: 12 system ROS neg except per HPI above  Blood pressure (!) 106/96, pulse (!) 145, temperature 97.9 F (36.6 C), temperature source Oral, resp. rate 13, height 6\' 1"  (1.854 m), weight 80.7 kg, SpO2 97 %. PHYSICAL EXAM: Gen: mild discomfort in bed  Eyes: anicteric ENT: MMM, poor dentition Neck: supple, no JVD CV:  HR in the 140s Abd: soft nontender :Lungs: clear, noral WOB GU: no foley Extr:  no edema Neuro: nonfocal Skin: no rashes   Results for orders placed or performed during the hospital encounter of 12/03/20 (from the past 48 hour(s))  Basic metabolic panel     Status: None    Collection Time: 12/03/20 12:03 PM  Result Value Ref Range   Sodium 140 135 - 145 mmol/L   Potassium 4.0 3.5 - 5.1 mmol/L   Chloride 104 98 - 111 mmol/L   CO2 28 22 - 32 mmol/L   Glucose, Bld 87 70 - 99 mg/dL    Comment: Glucose reference range applies only to samples taken after fasting for at least 8 hours.   BUN 16 6 - 20 mg/dL   Creatinine, Ser 0.90 0.61 - 1.24 mg/dL   Calcium 9.8 8.9 - 10.3 mg/dL   GFR, Estimated >60 >60 mL/min    Comment: (NOTE) Calculated using the CKD-EPI Creatinine Equation (2021)  Anion gap 8 5 - 15    Comment: Performed at [facility] [address], [place], [place] [address]  CBC     Status: Abnormal   Collection Time: 12/03/20 12:03 PM  Result Value Ref Range   WBC 10.7 (H) 4.0 - 10.5 K/uL   RBC 5.57 4.22 - 5.81 MIL/uL   Hemoglobin 15.7 13.0 - 17.0 g/dL   HCT 50.6 39.0 - 52.0 %   MCV 90.8 80.0 - 100.0 fL   MCH 28.2 26.0 - 34.0 pg   MCHC 31.0 30.0 - 36.0 g/dL   RDW 17.4 (H) 11.5 - 15.5 %   Platelets 303 150 - 400 K/uL   nRBC 0.0 0.0 - 0.2 %    Comment: Performed at [facility] [address] [place], [place] [address]  Troponin I (High Sensitivity)     Status: Abnormal   Collection Time: 12/03/20 12:03 PM  Result Value Ref Range   Troponin I (High Sensitivity) 245 (HH) <18 ng/L    Comment: CRITICAL RESULT CALLED TO, READ BACK BY AND VERIFIED WITH: C.ROWE RN [id number] 12/03/20 MCCORMICK K (NOTE) Elevated high sensitivity troponin I (hsTnI) values and significant  changes across serial measurements may suggest ACS but many other  chronic and acute conditions are known to elevate hsTnI results.  Refer to the Links section for chest pain algorithms and additional  guidance. Performed at [facility], [place] [address], [place], [place] [address]   Brain natriuretic peptide     Status: Abnormal   Collection Time: 12/03/20 12:03 PM  Result Value Ref Range   B Natriuretic Peptide 122.1 (H) 0.0 - 100.0 pg/mL    Comment:  Performed at [facility] [address] [place], [place] [address]  Troponin I (High Sensitivity)     Status: Abnormal   Collection Time: 12/03/20  2:10 PM  Result Value Ref Range   Troponin I (High Sensitivity) 186 (HH) <18 ng/L    Comment: CRITICAL VALUE NOTED.  VALUE IS CONSISTENT WITH PREVIOUSLY REPORTED AND CALLED VALUE. (NOTE) Elevated high sensitivity troponin I (hsTnI) values and significant  changes across serial measurements may suggest ACS but many other  chronic and acute conditions are known to elevate hsTnI results.  Refer to the Links section for chest pain algorithms and additional  guidance. Performed at [facility], [place] [address] [place], [place] [address]   Protime-INR     Status: None   Collection Time: 12/03/20  2:10 PM  Result Value Ref Range   Prothrombin Time 13.4 11.4 - 15.2 seconds   INR 1.0 0.8 - 1.2    Comment: (NOTE) INR goal varies based on device and disease states. Performed at [facility], [place] [address], [place], [place] [address]   Resp Panel by RT-PCR (Flu A&B, Covid) Nasopharyngeal Swab     Status: None   Collection Time: 12/03/20  2:16 PM   Specimen: Nasopharyngeal Swab; Nasopharyngeal(NP) swabs in vial transport medium  Result Value Ref Range   SARS Coronavirus 2 by RT PCR NEGATIVE NEGATIVE    Comment: (NOTE) SARS-CoV-2 target nucleic acids are NOT DETECTED.  The SARS-CoV-2 RNA is generally detectable in upper respiratory specimens during the acute phase of infection. The lowest concentration of SARS-CoV-2 viral copies this assay can detect is 138 copies/mL. A negative result does not preclude SARS-Cov-2 infection and should not be used as the sole basis for treatment or other patient management decisions. A  negative result may occur with  improper specimen collection/handling, submission of specimen other than nasopharyngeal swab, presence of viral mutation(s) within the areas targeted by this assay, and  inadequate number of viral copies(<138 copies/mL). A negative result must be combined with clinical observations, patient history, and epidemiological information. The expected result is Negative.  Fact Sheet for Patients:  EntrepreneurPulse.com.au  Fact Sheet for Healthcare Providers:  IncredibleEmployment.be  This test is no t yet approved or cleared by the [place] FDA and  has been authorized for detection and/or diagnosis of SARS-CoV-2 by FDA under an Emergency Use Authorization (EUA). This EUA will remain  in effect (meaning this test can be used) for the duration of the COVID-19 declaration under Section 564(b)(1) of the Act, 21 U.S.C.section 360bbb-3(b)(1), unless the authorization is terminated  or revoked sooner.       Influenza A by PCR NEGATIVE NEGATIVE   Influenza B by PCR NEGATIVE NEGATIVE    Comment: (NOTE) The Xpert Xpress SARS-CoV-2/FLU/RSV plus assay is intended as an aid in the diagnosis of influenza from Nasopharyngeal swab specimens and should not be used as a sole basis for treatment. Nasal washings and aspirates are unacceptable for Xpert Xpress SARS-CoV-2/FLU/RSV testing.  Fact Sheet for Patients: EntrepreneurPulse.com.au  Fact Sheet for Healthcare Providers: IncredibleEmployment.be  This test is not yet approved or cleared by the [place] FDA and has been authorized for detection and/or diagnosis of SARS-CoV-2 by FDA under an Emergency Use Authorization (EUA). This EUA will remain in effect (meaning this test can be used) for the duration of the COVID-19 declaration under Section 564(b)(1) of the Act, 21 U.S.C. section 360bbb-3(b)(1), unless the authorization is terminated or revoked.  Performed at [facility], [place] [address], [place], [place] [address]   POCT Activated clotting time     Status: None   Collection Time: 12/03/20  3:41 PM  Result Value Ref  Range   Activated Clotting Time 405 seconds    Comment: Reference range 74-137 seconds for patients not on anticoagulant therapy.  CBC     Status: Abnormal   Collection Time: 12/03/20  6:51 PM  Result Value Ref Range   WBC 7.5 4.0 - 10.5 K/uL   RBC 5.24 4.22 - 5.81 MIL/uL   Hemoglobin 14.6 13.0 - 17.0 g/dL   HCT 47.1 39.0 - 52.0 %   MCV 89.9 80.0 - 100.0 fL   MCH 27.9 26.0 - 34.0 pg   MCHC 31.0 30.0 - 36.0 g/dL   RDW 17.6 (H) 11.5 - 15.5 %   Platelets 255 150 - 400 K/uL   nRBC 0.0 0.0 - 0.2 %    Comment: Performed at [facility], [place] [address] [place], [place] [address]  Creatinine, serum     Status: None   Collection Time: 12/03/20  6:51 PM  Result Value Ref Range   Creatinine, Ser 0.84 0.61 - 1.24 mg/dL   GFR, Estimated >60 >60 mL/min    Comment: (NOTE) Calculated using the CKD-EPI Creatinine Equation (2021) Performed at [facility] [address], [place], [place] [address]   Troponin I (High Sensitivity)     Status: Abnormal   Collection Time: 12/03/20  6:51 PM  Result Value Ref Range   Troponin I (High Sensitivity) 263 (HH) <18 ng/L    Comment: CRITICAL VALUE NOTED.  VALUE IS CONSISTENT WITH PREVIOUSLY REPORTED AND CALLED VALUE. (NOTE) Elevated high sensitivity troponin I (hsTnI) values and significant  changes across serial measurements may  suggest ACS but many other  chronic and acute conditions are known to elevate hsTnI results.  Refer to the Links section for chest pain algorithms and additional  guidance. Performed at [facility], [place] [address], [place], [place] [address]   Troponin I (High Sensitivity)     Status: Abnormal   Collection Time: 12/03/20  8:35 PM  Result Value Ref Range   Troponin I (High Sensitivity) 201 (HH) <18 ng/L    Comment: CRITICAL VALUE NOTED.  VALUE IS CONSISTENT WITH PREVIOUSLY REPORTED AND CALLED VALUE. (NOTE) Elevated high sensitivity troponin I (hsTnI) values and significant  changes across serial  measurements may suggest ACS but many other  chronic and acute conditions are known to elevate hsTnI results.  Refer to the Links section for chest pain algorithms and additional  guidance. Performed at [facility], [place] [address] [place], [place] [address]   Lipid panel     Status: Abnormal   Collection Time: 12/04/20  1:37 AM  Result Value Ref Range   Cholesterol 94 0 - 200 mg/dL   Triglycerides 118 <150 mg/dL   HDL 25 (L) >40 mg/dL   Total CHOL/HDL Ratio 3.8 RATIO   VLDL 24 0 - 40 mg/dL   LDL Cholesterol 45 0 - 99 mg/dL    Comment:        Total Cholesterol/HDL:CHD Risk Coronary Heart Disease Risk Table                     Men   Women  1/2 Average Risk   3.4   3.3  Average Risk       5.0   4.4  2 X Average Risk   9.6   7.1  3 X Average Risk  23.4   11.0        Use the calculated Patient Ratio above and the CHD Risk Table to determine the patient's CHD Risk.        ATP III CLASSIFICATION (LDL):  <100     mg/dL   Optimal  100-129  mg/dL   Near or Above                    Optimal  130-159  mg/dL   Borderline  160-189  mg/dL   High  >190     mg/dL   Very High Performed at [facility] [address] [place], [place] [address]   Basic metabolic panel     Status: Abnormal   Collection Time: 12/04/20  1:37 AM  Result Value Ref Range   Sodium 138 135 - 145 mmol/L   Potassium 5.3 (H) 3.5 - 5.1 mmol/L    Comment: NO VISIBLE HEMOLYSIS   Chloride 108 98 - 111 mmol/L   CO2 26 22 - 32 mmol/L   Glucose, Bld 88 70 - 99 mg/dL    Comment: Glucose reference range applies only to samples taken after fasting for at least 8 hours.   BUN 17 6 - 20 mg/dL   Creatinine, Ser 1.06 0.61 - 1.24 mg/dL   Calcium 8.7 (L) 8.9 - 10.3 mg/dL   GFR, Estimated >60 >60 mL/min    Comment: (NOTE) Calculated using the CKD-EPI Creatinine Equation (2021)    Anion gap 4 (L) 5 - 15    Comment: Performed at [facility] [address], [place], [place] [address]  CBC     Status:  Abnormal   Collection Time: 12/04/20  1:37 AM  Result Value Ref Range   WBC 7.9 4.0 - 10.5 K/uL   RBC 5.25 4.22 - 5.81 MIL/uL   Hemoglobin 14.7 13.0 - 17.0 g/dL   HCT 47.2 39.0 - 52.0 %   MCV 89.9 80.0 - 100.0 fL   MCH 28.0 26.0 - 34.0 pg   MCHC 31.1 30.0 - 36.0 g/dL   RDW 17.4 (H) 11.5 - 15.5 %   Platelets 234 150 - 400 K/uL   nRBC 0.0 0.0 - 0.2 %    Comment: Performed at [facility] [address], [place], [place] [address]  Basic metabolic panel     Status: Abnormal   Collection Time: 12/04/20  2:10 PM  Result Value Ref Range   Sodium 141 135 - 145 mmol/L   Potassium 6.1 (H) 3.5 - 5.1 mmol/L   Chloride 107 98 - 111 mmol/L   CO2 26 22 - 32 mmol/L   Glucose, Bld 86 70 - 99 mg/dL    Comment: Glucose reference range applies only to samples taken after fasting for at least 8 hours.   BUN 16 6 - 20 mg/dL   Creatinine, Ser 0.95 0.61 - 1.24 mg/dL   Calcium 9.3 8.9 - 10.3 mg/dL   GFR, Estimated >60 >60 mL/min    Comment: (NOTE) Calculated using the CKD-EPI Creatinine Equation (2021)    Anion gap 8 5 - 15    Comment: Performed at [facility] [address] [place], [place] [address]  Basic metabolic panel     Status: Abnormal   Collection Time: 12/04/20  8:51 PM  Result Value Ref Range   Sodium 139 135 - 145 mmol/L   Potassium 5.5 (H) 3.5 - 5.1 mmol/L   Chloride 107 98 - 111 mmol/L   CO2 27 22 - 32 mmol/L   Glucose, Bld 102 (H) 70 - 99 mg/dL    Comment: Glucose reference range applies only to samples taken after fasting for at least 8 hours.   BUN 16 6 - 20 mg/dL   Creatinine, Ser 0.97 0.61 - 1.24 mg/dL   Calcium 8.6 (L) 8.9 - 10.3 mg/dL   GFR, Estimated >60 >60 mL/min    Comment: (NOTE) Calculated using the CKD-EPI Creatinine Equation (2021)    Anion gap 5 5 - 15    Comment: Performed at [facility] [address] [place], [place] [address]  Basic metabolic panel     Status: Abnormal   Collection Time: 12/05/20  4:54 AM  Result Value Ref Range    Sodium 140 135 - 145 mmol/L   Potassium 6.2 (H) 3.5 - 5.1 mmol/L   Chloride 108 98 - 111 mmol/L   CO2 26 22 - 32 mmol/L   Glucose, Bld 87 70 - 99 mg/dL    Comment: Glucose reference range applies only to samples taken after fasting for at least 8 hours.   BUN 15 6 - 20 mg/dL   Creatinine, Ser 0.86 0.61 - 1.24 mg/dL   Calcium 9.0 8.9 - 10.3 mg/dL   GFR, Estimated >60 >60 mL/min    Comment: (NOTE) Calculated using the CKD-EPI Creatinine Equation (2021)    Anion gap 6 5 - 15    Comment: Performed at [facility] [address], [place],  [address]    DG Chest 1 View  Result Date: 12/03/2020 CLINICAL DATA:  Pt states chest pain x 1 week HX: former smoker, HTN EXAM: CHEST  1 VIEW  COMPARISON:  08/09/2020. FINDINGS: Stable changes from prior CABG surgery. Cardiac silhouette is normal in size. Normal mediastinal and hilar contours. Clear lungs.  No pleural effusion or pneumothorax. Skeletal structures are grossly intact. IMPRESSION: No active disease. Electronically Signed   By: Lajean Manes M.D.   On: 12/03/2020 12:56   CARDIAC CATHETERIZATION  Result Date: 12/03/2020   Prox LAD to Mid LAD lesion is 100% stenosed.   Lat 1st Diag lesion is 100% stenosed.   Prox Cx to Mid Cx lesion is 99% stenosed.   Ost RCA to Prox RCA lesion is 100% stenosed.   Mid LAD to Dist LAD lesion is 50% stenosed.   Origin to Prox Graft lesion is 100% stenosed.   Origin lesion is 100% stenosed.   Origin lesion is 100% stenosed.   A drug-eluting stent was successfully placed using a STENT ONYX FRONTIER 3.0X15.   Post intervention, there is a 0% residual stenosis.   LIMA and is normal in caliber.   Right radial artery graft was visualized by angiography.   SVG graft was visualized by angiography.   The graft exhibits no disease.   LV end diastolic pressure is normal. Left dominant circulation Occluded LAD at prior stent site Patent LIMA to the LAD All other grafts are occluded including SVG to diagonal, radial  to OM, and SVG to PDA Low LVEDP 2 mm Hg Successful PCI of the native mid LCx with DES x 1 Plan: DAPT for one year. Hold CHF meds now due to low BP and low EDP. Will hydrate.   ECHOCARDIOGRAM COMPLETE  Result Date: 12/04/2020    ECHOCARDIOGRAM REPORT   Patient Name:   Wyatt Donovan Date of Exam: 12/04/2020 Medical Rec #:  [id number]        Height:       73.0 in Accession #:    [id number]       Weight:       178.4 lb Date of Birth:  03/21/1985        BSA:          2.050 m Patient Age:    19 years         BP:           120/81 mmHg Patient Gender: M                HR:           77 bpm. Exam Location:  Inpatient Procedure: 2D Echo, Cardiac Doppler and Color Doppler Indications:    CHF  History:        Patient has prior history of Echocardiogram examinations, most                 recent 10/15/2020. Cardiomyopathy, CAD and Previous Myocardial                 Infarction, Prior CABG, Signs/Symptoms:Chest Pain; Risk                 Factors:Dyslipidemia and Former Smoker. LV thrombus.  Sonographer:    Dustin Flock RDCS Referring Phys: [place]  1. No obvious apical thrombus in the region previously reported, however, this was a non-contrast study - if clinical concern, consider limited contrast echo. Left ventricular ejection fraction, by estimation, is 35 to 40%. The left ventricle has moderately decreased function. The left ventricle demonstrates regional wall motion abnormalities (see scoring diagram/findings for description). There is mild left ventricular hypertrophy. Left ventricular diastolic parameters are  consistent with Grade I diastolic dysfunction (impaired relaxation). There is severe akinesis of the left ventricular, entire inferior wall and inferoseptal wall.  2. Right ventricular systolic function is mildly reduced. The right ventricular size is normal.  3. The mitral valve is abnormal. Trivial mitral valve regurgitation.  4. The aortic valve was not well visualized. Aortic  valve regurgitation is not visualized.  5. The inferior vena cava is normal in size with greater than 50% respiratory variability, suggesting right atrial pressure of 3 mmHg. Comparison(s): Changes from prior study are noted. 10/15/2020: LVEF 40-45%, inferior and inferolateral hypokinesis, LV thrombus at the anteroapex. FINDINGS  Left Ventricle: No obvious apical thrombus in the region previously reported, however, this was a non-contrast study - if clinical concern, consider limited contrast echo. Left ventricular ejection fraction, by estimation, is 35 to 40%. The left ventricle has moderately decreased function. The left ventricle demonstrates regional wall motion abnormalities. Severe akinesis of the left ventricular, entire inferior wall and inferoseptal wall. The left ventricular internal cavity size was normal in size. There is mild left ventricular hypertrophy. Left ventricular diastolic parameters are consistent with Grade I diastolic dysfunction (impaired relaxation). Indeterminate filling pressures. Right Ventricle: The right ventricular size is normal. No increase in right ventricular wall thickness. Right ventricular systolic function is mildly reduced. Left Atrium: Left atrial size was normal in size. Right Atrium: Right atrial size was normal in size. Pericardium: There is no evidence of pericardial effusion. Mitral Valve: The mitral valve is abnormal. There is mild thickening of the mitral valve leaflet(s). There is mild calcification of the mitral valve leaflet(s). Trivial mitral valve regurgitation. Tricuspid Valve: The tricuspid valve is grossly normal. Tricuspid valve regurgitation is trivial. Aortic Valve: The aortic valve was not well visualized. Aortic valve regurgitation is not visualized. Pulmonic Valve: The pulmonic valve was not well visualized. Pulmonic valve regurgitation is not visualized. Aorta: The aortic root and ascending aorta are structurally normal, with no evidence of dilitation.  Venous: The inferior vena cava is normal in size with greater than 50% respiratory variability, suggesting right atrial pressure of 3 mmHg. IAS/Shunts: No atrial level shunt detected by color flow Doppler.  LEFT VENTRICLE PLAX 2D LVIDd:         5.20 cm      Diastology LVIDs:         4.30 cm      LV e' medial:    7.83 cm/s LV PW:         1.10 cm      LV E/e' medial:  7.7 LV IVS:        1.00 cm      LV e' lateral:   6.85 cm/s LVOT diam:     2.30 cm      LV E/e' lateral: 8.8 LV SV:         47 LV SV Index:   23 LVOT Area:     4.15 cm  LV Volumes (MOD) LV vol d, MOD A4C: 129.0 ml LV vol s, MOD A4C: 80.5 ml LV SV MOD A4C:     129.0 ml RIGHT VENTRICLE RV Basal diam:  2.50 cm RV S prime:     7.29 cm/s TAPSE (M-mode): 1.4 cm LEFT ATRIUM             Index       RIGHT ATRIUM           Index LA diam:        3.20 cm 1.56 cm/m  RA Area:     11.20 cm LA Vol (A2C):   42.5 ml 20.74 ml/m RA Volume:   24.10 ml  11.76 ml/m LA Vol (A4C):   31.5 ml 15.37 ml/m LA Biplane Vol: 37.2 ml 18.15 ml/m  AORTIC VALVE LVOT Vmax:   70.50 cm/s LVOT Vmean:  45.900 cm/s LVOT VTI:    0.114 m  AORTA Ao Root diam: 3.30 cm MITRAL VALVE MV Area (PHT): 7.51 cm    SHUNTS MV Decel Time: 101 msec    Systemic VTI:  0.11 m MV E velocity: 60.00 cm/s  Systemic Diam: 2.30 cm MV A velocity: 57.40 cm/s MV E/A ratio:  1.05 Lyman Bishop MD Electronically signed by Lyman Bishop MD Signature Date/Time: 12/04/2020/2:50:19 PM    Final     Assessment/Plan **Hyperkalemia, moderate:  Pt with normal renal function and recurrent hyperkalemia while hospitalized s/p PCI for NSTEMI.  Found to be hypovolemic on LVEDP measurements on LHC and from what I can see he is about net even per I/Os since that time.  I suspect the hyperkalemia is related to activation of renin-angiotensin system due to hypovolemia + vasoconstriction from iodinated contrast.  Would volume expand with isotonic fluids and hold SGLT2i for now as it can worsen hypovolemia.  Will defer fluid boluses to  cardiology given CHF -d/w them.  Has rec'd lokelma this AM already and K to be repeated later this AM.   Check CK and if elevated hold statin - thought doubt issue as statin was on board prior to admission.  I think this hyperkalemia issue will improve/resolve in the next 24-48h.   **Premature CAD: s/p PCI, follows with cardiology.  On statin, ASA/brilinta.  As above check CK given hyperkalemia.   **HFrEF:  per cardiology. Of note - I do not think this acute hyperkalemia is a contraindication for future SGLT2i use.    Will follow, please page with concerns.  D/w primary team.   Justin Mend 12/05/2020, 9:42 AM

## 2020-12-05 NOTE — Consult Note (Addendum)
ELECTROPHYSIOLOGY CONSULT NOTE    Patient ID: Wyatt Donovan MRN: [id number], DOB/AGE: 06-14-1985 35 y.o.  Admit date: 12/03/2020 Date of Consult: 12/05/2020  Primary Physician: Leonie Douglas, MD Primary Cardiologist: Loralie Champagne, MD  Electrophysiologist: New to Dr. Quentin Ore  Referring Provider: [name]  Patient Profile: Wyatt Donovan is a 35 y.o. male with a history of early CAD with first MI at age 67,  who is being seen today for the evaluation of WCT at the request of [name].  HPI:  Wyatt Donovan is a 34 y.o. male with complicated cardiac history including history of early onset CAD s/p CABG, ischemic cardiomyopathy, and LV thrombus. Patient had initial MI in 2012 at age 68, PCI to LAD.  He had inferoposterior MI in 4/22.  LHC showed 3 vessel disease, and patient had CABG x 4. Cardiac MRI in 5/22 showed LV EF 27% with LV thrombus, there was significant viability.  Post-op, patient had GI bleeding and anticoagulation was stopped.  He quit smoking after CABG.   Echo 8/22 EF 25-30%. He was referred to EP for ICD consideration. Awaiting initial consultation  He presented 12/03/2020 to [facility] with c/o SSCP c/w his prior angina, present at rest and worse with exertion associated with DOE.   Taken for urgent LHC which showed:   Prox LAD to Mid LAD lesion is 100% stenosed.   Lat 1st Diag lesion is 100% stenosed.   Prox Cx to Mid Cx lesion is 99% stenosed.   Ost RCA to Prox RCA lesion is 100% stenosed.   Mid LAD to Dist LAD lesion is 50% stenosed.   Origin to Prox Graft lesion is 100% stenosed.   Origin lesion is 100% stenosed.   Origin lesion is 100% stenosed.   A drug-eluting stent was successfully placed using a STENT ONYX FRONTIER 3.0X15.   Post intervention, there is a 0% residual stenosis.   LIMA and is normal in caliber.   Right radial artery graft was visualized by angiography.   SVG graft was visualized by angiography.   The graft exhibits no disease.   LV  end diastolic pressure is normal.   Left dominant circulation Occluded LAD at prior stent site Patent LIMA to the LAD All other grafts are occluded including SVG to diagonal, radial to OM, and SVG to PDA Low LVEDP 2 mm Hg Successful PCI of the native mid LCx with DES x 1  Echo 12/04/20 LVEF 35-40%  Am of 10/6 pt went into a WCT with rates in the 140s and 5/10 CP. Amiodarone started. EP asked to see to help clarify rhythm.  Difficult to tell if SVT/AT or VT given his extensive history.   Adenosine given at bedside with Dr. Quentin Ore and Zoll in place.  Pt converted briefly to sinus, and then into AF with controlled rate.   Would continue amiodarone and start [place].   Pt feels somewhat better in  slower rhythm  Reports first MI at 78 years old. His mother also passed away due to early CAD and complications. He has had several MIs in the past year and had CABG in May.   Past Medical History:  Diagnosis Date   Atypical chest pain    CAD (coronary artery disease)    Dyslipidemia    Ex-smoker    History of depression    Insomnia    Ischemic cardiomyopathy    Ejection fraction 40-45%   NSTEMI (non-ST elevated myocardial infarction) ([place]) 08/2007   Treated with  a bare metal stent to the proximal LAD   Suicide attempt [facility]) 01/2001     Surgical History:  Past Surgical History:  Procedure Laterality Date   ADENOIDECTOMY     CORONARY ARTERY BYPASS GRAFT N/A 07/03/2020   Procedure: CORONARY ARTERY BYPASS GRAFTING (CABG) times four using left internal mammary artery, right arm radial artery and right leg saphenous vein;  Surgeon: Lajuana Matte, MD;  Location: [place];  Service: Open Heart Surgery;  Laterality: N/A;   CORONARY STENT INTERVENTION N/A 12/03/2020   Procedure: CORONARY STENT INTERVENTION;  Surgeon: Martinique, Peter M, MD;  Location: [place] CV LAB;  Service: Cardiovascular;  Laterality: N/A;   CORONARY STENT PLACEMENT     Bare metal stent to proximal LAD   LEFT HEART CATH AND  CORONARY ANGIOGRAPHY N/A 07/01/2020   Procedure: LEFT HEART CATH AND CORONARY ANGIOGRAPHY;  Surgeon: Lorretta Harp, MD;  Location: [place] [facility];  Service: Cardiovascular;  Laterality: N/A;   LEFT HEART CATH AND CORS/GRAFTS ANGIOGRAPHY N/A 12/03/2020   Procedure: LEFT HEART CATH AND CORS/GRAFTS ANGIOGRAPHY;  Surgeon: Martinique, Peter M, MD;  Location: [place] [facility];  Service: Cardiovascular;  Laterality: N/A;   RADIAL ARTERY HARVEST Right 07/03/2020   Procedure: RADIAL ARTERY HARVEST;  Surgeon: Lajuana Matte, MD;  Location: [place];  Service: Open Heart Surgery;  Laterality: Right;   TEE WITHOUT CARDIOVERSION N/A 07/03/2020   Procedure: TRANSESOPHAGEAL ECHOCARDIOGRAM (TEE);  Surgeon: Lajuana Matte, MD;  Location: [place];  Service: Open Heart Surgery;  Laterality: N/A;     Medications Prior to Admission  Medication Sig Dispense Refill Last Dose   acetaminophen (TYLENOL) 500 MG tablet Take 1,000 mg by mouth every 6 (six) hours as needed for mild pain or headache.   unk   aspirin EC 81 MG tablet Take 81 mg by mouth daily. Swallow whole.   12/03/2020   atorvastatin (LIPITOR) 80 MG tablet TAKE 1 Tablet BY MOUTH ONCE DAILY 30 tablet 2 12/03/2020   carvedilol (COREG) 3.125 MG tablet Take 1 tablet (3.125 mg total) by mouth 2 (two) times daily with a meal. 180 tablet 3 12/03/2020 at 0800   dapagliflozin propanediol (FARXIGA) 10 MG TABS tablet Take 1 tablet (10 mg total) by mouth daily before breakfast. 30 tablet 2 12/03/2020   losartan (COZAAR) 25 MG tablet Take 0.5 tablets (12.5 mg total) by mouth at bedtime. 45 tablet 3 12/03/2020   nitroGLYCERIN (NITROSTAT) 0.4 MG SL tablet Place 0.4 mg under the tongue every 5 (five) minutes as needed for chest pain.   12/02/2020   spironolactone (ALDACTONE) 25 MG tablet Take 0.5 tablets (12.5 mg total) by mouth daily. 45 tablet 3 12/03/2020   isosorbide mononitrate (ISMO) 10 MG tablet Take 10 mg by mouth daily.       Inpatient Medications:   adenosine        aspirin EC  81 mg Oral Daily   atorvastatin  80 mg Oral Daily   carvedilol  3.125 mg Oral BID WC   enoxaparin (LOVENOX) injection  40 mg Subcutaneous Q24H   [COMPLETED] influenza vac split quadrivalent PF  0.5 mL Intramuscular Tomorrow-1000   ticagrelor  90 mg Oral BID    Allergies:  Allergies  Allergen Reactions   Codeine Itching    Social History   Socioeconomic History   Marital status: Single    Spouse name: Not on file   Number of children: Not on file   Years of education: Not on file  Highest education level: High school graduate  Occupational History   Occupation: Not actively working    Comment: applied for disability x 2.   Tobacco Use   Smoking status: Former    Packs/day: 1.00    Years: 22.00    Pack years: 22.00    Types: Cigarettes, Cigars    Quit date: 06/28/2020    Years since quitting: 0.4   Smokeless tobacco: Never   Tobacco comments:    cigarettes stopped in 2020, [employer] started in 2018-04-05/day  Vaping Use   Vaping Use: Never used  Substance and Sexual Activity   Alcohol use: Never   Drug use: Not Currently    Types: Marijuana   Sexual activity: Never  Other Topics Concern   Not on file  Social History Narrative   Single   Lives with his parents   Trying to get his GED   DeGent date all cultures   Social Determinants of Health   Financial Resource Strain: Medium Risk   Difficulty of Paying Living Expenses: Somewhat hard  Food Insecurity: No Food Insecurity   Worried About Charity fundraiser in the Last Year: Never true   Ran Out of Food in the Last Year: Never true  Transportation Needs: No Transportation Needs   Lack of Transportation (Medical): No   Lack of Transportation (Non-Medical): No  Physical Activity: Not on file  Stress: Not on file  Social Connections: Not on file  Intimate Partner Violence: Not on file     Family History  Problem Relation Age of Onset   Hypertension Neg Hx    Diabetes Neg Hx    Coronary  artery disease Neg Hx      Review of Systems: All other systems reviewed and are otherwise negative except as noted above.  Physical Exam: Vitals:   12/05/20 0923 12/05/20 0928 12/05/20 0933 12/05/20 0938  BP: (!) 133/98 (!) 125/97 (!) 137/117 (!) 106/96  Pulse:      Resp: 17 (!) 24 (!) 22 13  Temp:      TempSrc:      SpO2: 100%   97%  Weight:      Height:        GEN- The patient is well appearing, alert and oriented x 3 today.   HEENT: normocephalic, atraumatic; sclera clear, conjunctiva pink; hearing intact; oropharynx clear; neck supple Lungs- Clear to ausculation bilaterally, normal work of breathing.  No wheezes, rales, rhonchi Heart- Regular rate and rhythm, no murmurs, rubs or gallops GI- soft, non-tender, non-distended, bowel sounds present Extremities- no clubbing, cyanosis, or edema; DP/PT/radial pulses 2+ bilaterally MS- no significant deformity or atrophy Skin- warm and dry, no rash or lesion Psych- euthymic mood, full affect Neuro- strength and sensation are intact  Labs:   Lab Results  Component Value Date   WBC 7.9 12/04/2020   HGB 14.7 12/04/2020   HCT 47.2 12/04/2020   MCV 89.9 12/04/2020   PLT 234 12/04/2020    Recent Labs  Lab 12/05/20 0454  NA 140  K 6.2*  CL 108  CO2 26  BUN 15  CREATININE 0.86  CALCIUM 9.0  GLUCOSE 87      Radiology/Studies: DG Chest 1 View  Result Date: 12/03/2020 CLINICAL DATA:  Pt states chest pain x 1 week HX: former smoker, HTN EXAM: CHEST  1 VIEW COMPARISON:  08/09/2020. FINDINGS: Stable changes from prior CABG surgery. Cardiac silhouette is normal in size. Normal mediastinal and hilar contours. Clear lungs.  No pleural  effusion or pneumothorax. Skeletal structures are grossly intact. IMPRESSION: No active disease. Electronically Signed   By: Lajean Manes M.D.   On: 12/03/2020 12:56   CARDIAC CATHETERIZATION  Result Date: 12/03/2020   Prox LAD to Mid LAD lesion is 100% stenosed.   Lat 1st Diag lesion is 100%  stenosed.   Prox Cx to Mid Cx lesion is 99% stenosed.   Ost RCA to Prox RCA lesion is 100% stenosed.   Mid LAD to Dist LAD lesion is 50% stenosed.   Origin to Prox Graft lesion is 100% stenosed.   Origin lesion is 100% stenosed.   Origin lesion is 100% stenosed.   A drug-eluting stent was successfully placed using a STENT ONYX FRONTIER 3.0X15.   Post intervention, there is a 0% residual stenosis.   LIMA and is normal in caliber.   Right radial artery graft was visualized by angiography.   SVG graft was visualized by angiography.   The graft exhibits no disease.   LV end diastolic pressure is normal. Left dominant circulation Occluded LAD at prior stent site Patent LIMA to the LAD All other grafts are occluded including SVG to diagonal, radial to OM, and SVG to PDA Low LVEDP 2 mm Hg Successful PCI of the native mid LCx with DES x 1 Plan: DAPT for one year. Hold CHF meds now due to low BP and low EDP. Will hydrate.   ECHOCARDIOGRAM COMPLETE  Result Date: 12/04/2020    ECHOCARDIOGRAM REPORT   Patient Name:   Wyatt Donovan Date of Exam: 12/04/2020 Medical Rec #:  [id number]        Height:       73.0 in Accession #:    [id number]       Weight:       178.4 lb Date of Birth:  1986-02-01        BSA:          2.050 m Patient Age:    17 years         BP:           120/81 mmHg Patient Gender: M                HR:           77 bpm. Exam Location:  Inpatient Procedure: 2D Echo, Cardiac Doppler and Color Doppler Indications:    CHF  History:        Patient has prior history of Echocardiogram examinations, most                 recent 10/15/2020. Cardiomyopathy, CAD and Previous Myocardial                 Infarction, Prior CABG, Signs/Symptoms:Chest Pain; Risk                 Factors:Dyslipidemia and Former Smoker. LV thrombus.  Sonographer:    Dustin Flock RDCS Referring Phys: [place]  1. No obvious apical thrombus in the region previously reported, however, this was a non-contrast study - if  clinical concern, consider limited contrast echo. Left ventricular ejection fraction, by estimation, is 35 to 40%. The left ventricle has moderately decreased function. The left ventricle demonstrates regional wall motion abnormalities (see scoring diagram/findings for description). There is mild left ventricular hypertrophy. Left ventricular diastolic parameters are consistent with Grade I diastolic dysfunction (impaired relaxation). There is severe akinesis of the left ventricular, entire inferior wall and inferoseptal wall.  2. Right  ventricular systolic function is mildly reduced. The right ventricular size is normal.  3. The mitral valve is abnormal. Trivial mitral valve regurgitation.  4. The aortic valve was not well visualized. Aortic valve regurgitation is not visualized.  5. The inferior vena cava is normal in size with greater than 50% respiratory variability, suggesting right atrial pressure of 3 mmHg. Comparison(s): Changes from prior study are noted. 10/15/2020: LVEF 40-45%, inferior and inferolateral hypokinesis, LV thrombus at the anteroapex. FINDINGS  Left Ventricle: No obvious apical thrombus in the region previously reported, however, this was a non-contrast study - if clinical concern, consider limited contrast echo. Left ventricular ejection fraction, by estimation, is 35 to 40%. The left ventricle has moderately decreased function. The left ventricle demonstrates regional wall motion abnormalities. Severe akinesis of the left ventricular, entire inferior wall and inferoseptal wall. The left ventricular internal cavity size was normal in size. There is mild left ventricular hypertrophy. Left ventricular diastolic parameters are consistent with Grade I diastolic dysfunction (impaired relaxation). Indeterminate filling pressures. Right Ventricle: The right ventricular size is normal. No increase in right ventricular wall thickness. Right ventricular systolic function is mildly reduced. Left Atrium:  Left atrial size was normal in size. Right Atrium: Right atrial size was normal in size. Pericardium: There is no evidence of pericardial effusion. Mitral Valve: The mitral valve is abnormal. There is mild thickening of the mitral valve leaflet(s). There is mild calcification of the mitral valve leaflet(s). Trivial mitral valve regurgitation. Tricuspid Valve: The tricuspid valve is grossly normal. Tricuspid valve regurgitation is trivial. Aortic Valve: The aortic valve was not well visualized. Aortic valve regurgitation is not visualized. Pulmonic Valve: The pulmonic valve was not well visualized. Pulmonic valve regurgitation is not visualized. Aorta: The aortic root and ascending aorta are structurally normal, with no evidence of dilitation. Venous: The inferior vena cava is normal in size with greater than 50% respiratory variability, suggesting right atrial pressure of 3 mmHg. IAS/Shunts: No atrial level shunt detected by color flow Doppler.  LEFT VENTRICLE PLAX 2D LVIDd:         5.20 cm      Diastology LVIDs:         4.30 cm      LV e' medial:    7.83 cm/s LV PW:         1.10 cm      LV E/e' medial:  7.7 LV IVS:        1.00 cm      LV e' lateral:   6.85 cm/s LVOT diam:     2.30 cm      LV E/e' lateral: 8.8 LV SV:         47 LV SV Index:   23 LVOT Area:     4.15 cm  LV Volumes (MOD) LV vol d, MOD A4C: 129.0 ml LV vol s, MOD A4C: 80.5 ml LV SV MOD A4C:     129.0 ml RIGHT VENTRICLE RV Basal diam:  2.50 cm RV S prime:     7.29 cm/s TAPSE (M-mode): 1.4 cm LEFT ATRIUM             Index       RIGHT ATRIUM           Index LA diam:        3.20 cm 1.56 cm/m  RA Area:     11.20 cm LA Vol (A2C):   42.5 ml 20.74 ml/m RA Volume:   24.10 ml  11.76 ml/m  LA Vol (A4C):   31.5 ml 15.37 ml/m LA Biplane Vol: 37.2 ml 18.15 ml/m  AORTIC VALVE LVOT Vmax:   70.50 cm/s LVOT Vmean:  45.900 cm/s LVOT VTI:    0.114 m  AORTA Ao Root diam: 3.30 cm MITRAL VALVE MV Area (PHT): 7.51 cm    SHUNTS MV Decel Time: 101 msec    Systemic VTI:   0.11 m MV E velocity: 60.00 cm/s  Systemic Diam: 2.30 cm MV A velocity: 57.40 cm/s MV E/A ratio:  1.05 Lyman Bishop MD Electronically signed by Lyman Bishop MD Signature Date/Time: 12/04/2020/2:50:19 PM    Final     WUJ:WJXBJ shows WCT in 140s. Difficult to tell if SVT/AT or VT (personally reviewed)  Baseline EKG shows NSR with 1st degree AV block  TELEMETRY: NSR with occasional ectopy. Difficult to appreciate leading beat of his arrhyhtmia. No PVCs during arrhythmia to help clarify atrial rate.  (personally reviewed)  Assessment/Plan: 1.  WCT -> SVT/AT Appears to have been an atrial tachycardia which converted to atrial fibrillation with adenosine Continue amiodarone. Ideally he will convert with this Add [place].   2. CAD s/p CABG 06/2020 Early CAD, no known history of FH.  Mother passed away from early CAD as well.  PCI this admission Per primary  3. ICM EF 25-30 09/2020 , 35-40% this admission Has been referred to EP for ICD consideration.  GDMT per CHF team Can follow up with Dr. Quentin Ore as outpatient to further discuss  4. Hyperkalemia Unclear how this may be contributing to his arrhyhtmia Nephrology consult pending  For questions or updates, please contact [place] Please consult [email or web address] for contact info under Cardiology/STEMI.  Jacalyn Lefevre, PA-C  12/05/2020 9:51 AM

## 2020-12-05 NOTE — Progress Notes (Signed)
Monitor alarmed at 0918hrs, patient HR 140s with wide QRS. Patient states he was laying in bed and felt heart racing,  Denies SOB, states CP 5/10 and feels hot. BP 118/89.  Pacer pads placed, rapid response called.  L. Wyatt Portela PA notified and at bedside.  EKG done.  Order for amiodarone placed.  Amiodarone gtt IV started.  Patient updated on plan of care.

## 2020-12-05 NOTE — Significant Event (Signed)
Rapid Response Event Note   Reason for Call :  Rapid Rhythym  Initial Focused Assessment:  Patient is laying in bed he is full alert and orient.  He endorses 5/10 chest pain, denies shortness of breath.    BP 118/89  HR 145  RR 24  O2 sat 98% on RA  Lindsay NP at bedside   Interventions:  12 lead EKG done Amiodarone bolus and gtt started. BP 93/80  HR 145  Dr Quentin Ore at bedside 6mg  Adenosine given rapid IV push  HR converted to Afi  80 BP 122/87  Plan of Care:  Continue Amiodarone gtt Remain inpatient RN to call if further assistance needed   Event Summary:   MD Notified: Ria Comment NP & Dr Quentin Ore Call Time: [phone] Arrival Time: 0349 End Time: 6116  Raliegh Ip, RN

## 2020-12-05 NOTE — TOC Progression Note (Signed)
Transition of Care [facility]) - Progression Note    Patient Details  Name: Wyatt Donovan MRN: [id number] Date of Birth: Oct 25, 1985  [facility] CM/SW [place], [place] Phone Number: 12/05/2020, 10:16 AM  Clinical Narrative:    HF CSW spoke with Mr. Camplin at bedside to follow up about the discussion from yesterday about his disability. CSW provided Mr. Destin with information about applying for SSI online instead of with the [facility].   CSW will continue to follow throughout discharge.   Expected Discharge Plan: Home/Self Care Barriers to Discharge: No Barriers Identified  Expected Discharge Plan and Services Expected Discharge Plan: Home/Self Care In-house Referral: Clinical Social Work Discharge Planning Services: CM Consult   Living arrangements for the past 2 months: Single Family Home Expected Discharge Date: 12/04/20                                     Social Determinants of Health (SDOH) Interventions Food Insecurity Interventions: Other (Comment) (has food stamps) Financial Strain Interventions: Development worker, community (already seen pt.) Housing Interventions: Intervention Not Indicated Transportation Interventions: Intervention Not Indicated, [facility]  Readmission Risk Interventions Readmission Risk Prevention Plan 07/10/2020  Post Dischage Appt Complete  Medication Screening Complete  Transportation Screening Complete  Some recent data might be hidden   Cionna Collantes, MSW, LCSWA [phone] Heart Failure Social Worker

## 2020-12-05 NOTE — Progress Notes (Addendum)
Notified by RN that patient went into WCT with rate in 140s around 9:20 am.   Patient seen at beside. Initially given amiodarone bolus followed by drip.   EP contacted and evaluated patient. Rhythm appeared to be atrial tachycardia which converted to atrial fibrillation after administered adenosine.  Amio continued. Started DOAC. Plan for DCCV tomorrow am if does not convert chemically.

## 2020-12-06 ENCOUNTER — Other Ambulatory Visit (HOSPITAL_COMMUNITY): Payer: Self-pay

## 2020-12-06 ENCOUNTER — Encounter (HOSPITAL_COMMUNITY): Payer: Self-pay | Admitting: Cardiology

## 2020-12-06 ENCOUNTER — Inpatient Hospital Stay (HOSPITAL_COMMUNITY): Payer: Medicaid Other | Admitting: Anesthesiology

## 2020-12-06 ENCOUNTER — Encounter (HOSPITAL_COMMUNITY)
Admission: EM | Disposition: A | Discharge status: Home / Self Care | Payer: Self-pay | Source: Home / Self Care | Attending: Cardiology

## 2020-12-06 DIAGNOSIS — I214 Non-ST elevation (NSTEMI) myocardial infarction: Secondary | ICD-10-CM | POA: Diagnosis not present

## 2020-12-06 DIAGNOSIS — I4891 Unspecified atrial fibrillation: Secondary | ICD-10-CM | POA: Diagnosis not present

## 2020-12-06 DIAGNOSIS — R079 Chest pain, unspecified: Secondary | ICD-10-CM

## 2020-12-06 DIAGNOSIS — I483 Typical atrial flutter: Secondary | ICD-10-CM | POA: Diagnosis not present

## 2020-12-06 HISTORY — PX: CARDIOVERSION: SHX1299

## 2020-12-06 LAB — CBC
HCT: 46.7 % (ref 39.0–52.0)
Hemoglobin: 15.2 g/dL (ref 13.0–17.0)
MCH: 28.8 pg (ref 26.0–34.0)
MCHC: 32.5 g/dL (ref 30.0–36.0)
MCV: 88.4 fL (ref 80.0–100.0)
Platelets: 227 10*3/uL (ref 150–400)
RBC: 5.28 MIL/uL (ref 4.22–5.81)
RDW: 17.3 % — ABNORMAL HIGH (ref 11.5–15.5)
WBC: 7.4 10*3/uL (ref 4.0–10.5)
nRBC: 0 % (ref 0.0–0.2)

## 2020-12-06 LAB — BASIC METABOLIC PANEL
Anion gap: 7 (ref 5–15)
BUN: 22 mg/dL — ABNORMAL HIGH (ref 6–20)
CO2: 21 mmol/L — ABNORMAL LOW (ref 22–32)
Calcium: 8.9 mg/dL (ref 8.9–10.3)
Chloride: 111 mmol/L (ref 98–111)
Creatinine, Ser: 1.17 mg/dL (ref 0.61–1.24)
GFR, Estimated: >60 mL/min (ref >60)
Glucose, Bld: 99 mg/dL (ref 70–99)
Potassium: 4.2 mmol/L (ref 3.5–5.1)
Sodium: 139 mmol/L (ref 135–145)

## 2020-12-06 LAB — MAGNESIUM: Magnesium: 2.2 mg/dL (ref 1.7–2.4)

## 2020-12-06 SURGERY — CARDIOVERSION
Anesthesia: General

## 2020-12-06 MED ORDER — AMIODARONE HCL 200 MG PO TABS
200.0000 mg | ORAL_TABLET | Freq: Two times a day (BID) | ORAL | 6 refills | Status: DC
  Filled 2020-12-06: qty 60, 30d supply, fill #0

## 2020-12-06 MED ORDER — DAPAGLIFLOZIN PROPANEDIOL 10 MG PO TABS
10.0000 mg | ORAL_TABLET | Freq: Every day | ORAL | Status: DC
  Administered 2020-12-06: 10 mg via ORAL
  Filled 2020-12-06: qty 1

## 2020-12-06 MED ORDER — CLOPIDOGREL BISULFATE 75 MG PO TABS
75.0000 mg | ORAL_TABLET | Freq: Every day | ORAL | 6 refills | Status: DC
  Filled 2020-12-06: qty 30, 30d supply, fill #0

## 2020-12-06 MED ORDER — APIXABAN 5 MG PO TABS
5.0000 mg | ORAL_TABLET | Freq: Two times a day (BID) | ORAL | 6 refills | Status: DC
  Filled 2020-12-06: qty 60, 30d supply, fill #0

## 2020-12-06 MED ORDER — LIDOCAINE HCL (CARDIAC) PF 100 MG/5ML IV SOSY
PREFILLED_SYRINGE | INTRAVENOUS | Status: DC | PRN
  Administered 2020-12-06: 80 mg via INTRAVENOUS

## 2020-12-06 MED ORDER — ASPIRIN 81 MG PO TBEC
81.0000 mg | DELAYED_RELEASE_TABLET | Freq: Every day | ORAL | 11 refills | Status: DC
  Filled 2020-12-06: qty 30, 30d supply, fill #0

## 2020-12-06 MED ORDER — PROPOFOL 10 MG/ML IV BOLUS
INTRAVENOUS | Status: DC | PRN
  Administered 2020-12-06: 20 mg via INTRAVENOUS
  Administered 2020-12-06: 30 mg via INTRAVENOUS
  Administered 2020-12-06: 70 mg via INTRAVENOUS

## 2020-12-06 MED ORDER — AMIODARONE HCL 200 MG PO TABS
200.0000 mg | ORAL_TABLET | Freq: Two times a day (BID) | ORAL | Status: DC
  Administered 2020-12-06: 200 mg via ORAL
  Filled 2020-12-06: qty 1

## 2020-12-06 MED ORDER — SODIUM CHLORIDE 0.9 % IV SOLN: INTRAVENOUS | Status: DC | PRN

## 2020-12-06 NOTE — Anesthesia Postprocedure Evaluation (Signed)
Anesthesia Post Note  Patient: Wyatt Donovan  Procedure(s) Performed: CARDIOVERSION     Patient location during evaluation: Endoscopy Anesthesia Type: General Level of consciousness: awake and alert Pain management: pain level controlled Vital Signs Assessment: post-procedure vital signs reviewed and stable Respiratory status: spontaneous breathing, nonlabored ventilation, respiratory function stable and patient connected to nasal cannula oxygen Cardiovascular status: blood pressure returned to baseline and stable Postop Assessment: no apparent nausea or vomiting Anesthetic complications: no   No notable events documented.  Last Vitals:  Vitals:   12/06/20 1138 12/06/20 1151  BP: 98/78 108/88  Pulse: 76   Resp: 17 18  Temp:  36.6 C  SpO2: 97% 100%    Last Pain:  Vitals:   12/06/20 1151  TempSrc: Oral  PainSc:                  March Rummage Sahil Milner

## 2020-12-06 NOTE — Progress Notes (Signed)
Electrophysiology Rounding Note  Patient Name: Wyatt Donovan Date of Encounter: 12/06/2020  Primary Cardiologist: Loralie Champagne, MD Electrophysiologist: Dr. Quentin Ore   Subjective   The patient is doing well today. Now in an atrial flutter. No chest pain at rest.   Inpatient Medications    Scheduled Meds:  apixaban  5 mg Oral BID   aspirin EC  81 mg Oral Daily   atorvastatin  80 mg Oral Daily   carvedilol  3.125 mg Oral BID WC   clopidogrel  75 mg Oral Daily   Continuous Infusions:  amiodarone 30 mg/hr (12/06/20 0720)   PRN Meds: acetaminophen, calcium carbonate, ondansetron (ZOFRAN) IV   Vital Signs    Vitals:   12/05/20 1721 12/05/20 2004 12/05/20 2353 12/06/20 0404  BP: 103/73 91/67 92/67  105/67  Pulse:  62 71 68  Resp: 13 17 18 17   Temp:  97.7 F (36.5 C) 98.6 F (37 C) 98.2 F (36.8 C)  TempSrc:  Oral Oral Oral  SpO2: 100% 99% 100% 94%  Weight:      Height:        Intake/Output Summary (Last 24 hours) at 12/06/2020 0746 Last data filed at 12/06/2020 0600 Gross per 24 hour  Intake 2088.47 ml  Output 2125 ml  Net -36.53 ml   Filed Weights   12/03/20 1152  Weight: 80.7 kg    Physical Exam    GEN- The patient is well appearing, alert and oriented x 3 today.   Head- normocephalic, atraumatic Eyes-  Sclera clear, conjunctiva pink Ears- hearing intact Oropharynx- clear Neck- supple Lungs- Clear to ausculation bilaterally, normal work of breathing Heart- Regular rate and rhythm, no murmurs, rubs or gallops GI- soft, NT, ND, + BS Extremities- no clubbing or cyanosis. No edema Skin- no rash or lesion Psych- euthymic mood, full affect Neuro- strength and sensation are intact  Labs    CBC Recent Labs    12/04/20 0137 12/06/20 0229  WBC 7.9 7.4  HGB 14.7 15.2  HCT 47.2 46.7  MCV 89.9 88.4  PLT 234 563   Basic Metabolic Panel Recent Labs    12/05/20 1142 12/06/20 0229  NA 142 139  K 3.7 4.2  CL 111 111  CO2 23 21*  GLUCOSE 97 99   BUN 14 22*  CREATININE 0.76 1.17  CALCIUM 8.7* 8.9  MG  --  2.2   Liver Function Tests No results for input(s): AST, ALT, ALKPHOS, BILITOT, PROT, ALBUMIN in the last 72 hours. No results for input(s): LIPASE, AMYLASE in the last 72 hours. Cardiac Enzymes Recent Labs    12/05/20 1142  CKTOTAL 281     Telemetry    Rate controlled atrial flutter 60-70s, fib yesterday afternoon, and brief periods of an SVT (personally reviewed)  Radiology    ECHOCARDIOGRAM COMPLETE  Result Date: 12/04/2020    ECHOCARDIOGRAM REPORT   Patient Name:   Wyatt Donovan Date of Exam: 12/04/2020 Medical Rec #:  [id number]        Height:       73.0 in Accession #:    [id number]       Weight:       178.4 lb Date of Birth:  1985/10/28        BSA:          2.050 m Patient Age:    35 years         BP:           120/81 mmHg Patient Gender:  M                HR:           77 bpm. Exam Location:  Inpatient Procedure: 2D Echo, Cardiac Doppler and Color Doppler Indications:    CHF  History:        Patient has prior history of Echocardiogram examinations, most                 recent 10/15/2020. Cardiomyopathy, CAD and Previous Myocardial                 Infarction, Prior CABG, Signs/Symptoms:Chest Pain; Risk                 Factors:Dyslipidemia and Former Smoker. LV thrombus.  Sonographer:    Dustin Flock RDCS Referring Phys: [place]  1. No obvious apical thrombus in the region previously reported, however, this was a non-contrast study - if clinical concern, consider limited contrast echo. Left ventricular ejection fraction, by estimation, is 35 to 40%. The left ventricle has moderately decreased function. The left ventricle demonstrates regional wall motion abnormalities (see scoring diagram/findings for description). There is mild left ventricular hypertrophy. Left ventricular diastolic parameters are consistent with Grade I diastolic dysfunction (impaired relaxation). There is severe akinesis  of the left ventricular, entire inferior wall and inferoseptal wall.  2. Right ventricular systolic function is mildly reduced. The right ventricular size is normal.  3. The mitral valve is abnormal. Trivial mitral valve regurgitation.  4. The aortic valve was not well visualized. Aortic valve regurgitation is not visualized.  5. The inferior vena cava is normal in size with greater than 50% respiratory variability, suggesting right atrial pressure of 3 mmHg. Comparison(s): Changes from prior study are noted. 10/15/2020: LVEF 40-45%, inferior and inferolateral hypokinesis, LV thrombus at the anteroapex. FINDINGS  Left Ventricle: No obvious apical thrombus in the region previously reported, however, this was a non-contrast study - if clinical concern, consider limited contrast echo. Left ventricular ejection fraction, by estimation, is 35 to 40%. The left ventricle has moderately decreased function. The left ventricle demonstrates regional wall motion abnormalities. Severe akinesis of the left ventricular, entire inferior wall and inferoseptal wall. The left ventricular internal cavity size was normal in size. There is mild left ventricular hypertrophy. Left ventricular diastolic parameters are consistent with Grade I diastolic dysfunction (impaired relaxation). Indeterminate filling pressures. Right Ventricle: The right ventricular size is normal. No increase in right ventricular wall thickness. Right ventricular systolic function is mildly reduced. Left Atrium: Left atrial size was normal in size. Right Atrium: Right atrial size was normal in size. Pericardium: There is no evidence of pericardial effusion. Mitral Valve: The mitral valve is abnormal. There is mild thickening of the mitral valve leaflet(s). There is mild calcification of the mitral valve leaflet(s). Trivial mitral valve regurgitation. Tricuspid Valve: The tricuspid valve is grossly normal. Tricuspid valve regurgitation is trivial. Aortic Valve: The  aortic valve was not well visualized. Aortic valve regurgitation is not visualized. Pulmonic Valve: The pulmonic valve was not well visualized. Pulmonic valve regurgitation is not visualized. Aorta: The aortic root and ascending aorta are structurally normal, with no evidence of dilitation. Venous: The inferior vena cava is normal in size with greater than 50% respiratory variability, suggesting right atrial pressure of 3 mmHg. IAS/Shunts: No atrial level shunt detected by color flow Doppler.  LEFT VENTRICLE PLAX 2D LVIDd:         5.20 cm  Diastology LVIDs:         4.30 cm      LV e' medial:    7.83 cm/s LV PW:         1.10 cm      LV E/e' medial:  7.7 LV IVS:        1.00 cm      LV e' lateral:   6.85 cm/s LVOT diam:     2.30 cm      LV E/e' lateral: 8.8 LV SV:         47 LV SV Index:   23 LVOT Area:     4.15 cm  LV Volumes (MOD) LV vol d, MOD A4C: 129.0 ml LV vol s, MOD A4C: 80.5 ml LV SV MOD A4C:     129.0 ml RIGHT VENTRICLE RV Basal diam:  2.50 cm RV S prime:     7.29 cm/s TAPSE (M-mode): 1.4 cm LEFT ATRIUM             Index       RIGHT ATRIUM           Index LA diam:        3.20 cm 1.56 cm/m  RA Area:     11.20 cm LA Vol (A2C):   42.5 ml 20.74 ml/m RA Volume:   24.10 ml  11.76 ml/m LA Vol (A4C):   31.5 ml 15.37 ml/m LA Biplane Vol: 37.2 ml 18.15 ml/m  AORTIC VALVE LVOT Vmax:   70.50 cm/s LVOT Vmean:  45.900 cm/s LVOT VTI:    0.114 m  AORTA Ao Root diam: 3.30 cm MITRAL VALVE MV Area (PHT): 7.51 cm    SHUNTS MV Decel Time: 101 msec    Systemic VTI:  0.11 m MV E velocity: 60.00 cm/s  Systemic Diam: 2.30 cm MV A velocity: 57.40 cm/s MV E/A ratio:  1.05 Lyman Bishop MD Electronically signed by Lyman Bishop MD Signature Date/Time: 12/04/2020/2:50:19 PM    Final     Patient Profile     Wyatt Donovan is a 35 y.o. male with complicated cardiac history including history of early onset CAD s/p CABG, ischemic cardiomyopathy, and LV thrombus. Patient had initial MI in 2012 at age 89, PCI to LAD.  He had  inferoposterior MI in 4/22.  LHC showed 3 vessel disease, and patient had CABG x 4. Cardiac MRI in 5/22 showed LV EF 27% with LV thrombus, there was significant viability.  Post-op, patient had GI bleeding and anticoagulation was stopped.  He quit smoking after CABG.   Assessment & Plan    1.  WCT -> SVT/AT -> Now atrial flutter Remains on IV amiodarone Plan for [facility] today if no thrombus on cMRI.  Continue eliquis   2. CAD s/p CABG 06/2020 Early CAD, no known history of FH.  Mother passed away from early CAD as well.  PCI this admission Per primary   3. ICM EF 25-30 09/2020 , 35-40% this admission Has been referred to EP for ICD consideration.  GDMT per CHF team Can follow up with Dr. Quentin Ore as outpatient to further discuss   4. Hyperkalemia K down to 4.2 this am with [facility] has seen.    For questions or updates, please contact [place] Please consult [email or web address] for contact info under Cardiology/STEMI.  Signed, Shirley Friar, PA-C  12/06/2020, 7:46 AM

## 2020-12-06 NOTE — Interval H&P Note (Signed)
History and Physical Interval Note:  12/06/2020 11:09 AM  Wyatt Donovan  has presented today for surgery, with the diagnosis of afib.  The various methods of treatment have been discussed with the patient and family. After consideration of risks, benefits and other options for treatment, the patient has consented to  Procedure(s): CARDIOVERSION (N/A) as a surgical intervention.  The patient's history has been reviewed, patient examined, no change in status, stable for surgery.  I have reviewed the patient's chart and labs.  Questions were answered to the patient's satisfaction.     [name] [employer]

## 2020-12-06 NOTE — Discharge Instructions (Signed)

## 2020-12-06 NOTE — Progress Notes (Signed)
Advanced Heart Failure Rounding Note  PCP-Cardiologist: Loralie Champagne, MD   Subjective:   10/04: Admitted with NSTEMI. S/p PCI/DES native mid Lcx.  10/05: Cancelled discharge home d/t hyperkalemia 10/06: Patient went into atrial flutter with RVR, amiodarone gtt and Eliquis started.  10/07: DCCV to NSR.   Patient denies dyspnea or chest pain.  He was cardioverted today back to NSR.    R/LHC, 12/03/20 Left dominant circulation Occluded LAD at prior stent site Patent LIMA to the LAD All other grafts are occluded including SVG to diagonal, radial to OM, and SVG to PDA Low LVEDP 2 mm Hg Successful PCI of the native mid LCx with DES x 1 Plan: DAPT for one year. Hold CHF meds now due to low BP and low EDP. Will hydrate.    Objective:   Weight Range: 80.7 kg Body mass index is 23.48 kg/m.   Vital Signs:   Temp:  [96.6 F (35.9 C)-98.6 F (37 C)] 97.8 F (36.6 C) (10/07 1151) Pulse Rate:  [62-86] 76 (10/07 1138) Resp:  [12-26] 18 (10/07 1151) BP: (82-119)/(64-93) 108/88 (10/07 1151) SpO2:  [94 %-100 %] 100 % (10/07 1151) Last BM Date: 12/03/20  Weight change: Filed Weights   12/03/20 1152  Weight: 80.7 kg    Intake/Output:   Intake/Output Summary (Last 24 hours) at 12/06/2020 1210 Last data filed at 12/06/2020 1121 Gross per 24 hour  Intake 1883.62 ml  Output 2125 ml  Net -241.38 ml      Physical Exam    General: NAD Neck: JVP 7-8 cm, no thyromegaly or thyroid nodule.  Lungs: Clear to auscultation bilaterally with normal respiratory effort. CV: Nondisplaced PMI.  Heart regular S1/S2, no S3/S4, no murmur.  No peripheral edema.   Abdomen: Soft, nontender, no hepatosplenomegaly, no distention.  Skin: Intact without lesions or rashes.  Neurologic: Alert and oriented x 3.  Psych: Normal affect. Extremities: No clubbing or cyanosis.  HEENT: Normal.    Telemetry   Atrial flutter 90s => NSR 70s (personally reviewed)  Labs    CBC Recent Labs     12/04/20 0137 12/06/20 0229  WBC 7.9 7.4  HGB 14.7 15.2  HCT 47.2 46.7  MCV 89.9 88.4  PLT 234 419   Basic Metabolic Panel Recent Labs    12/05/20 1142 12/06/20 0229  NA 142 139  K 3.7 4.2  CL 111 111  CO2 23 21*  GLUCOSE 97 99  BUN 14 22*  CREATININE 0.76 1.17  CALCIUM 8.7* 8.9  MG  --  2.2   Liver Function Tests No results for input(s): AST, ALT, ALKPHOS, BILITOT, PROT, ALBUMIN in the last 72 hours. No results for input(s): LIPASE, AMYLASE in the last 72 hours. Cardiac Enzymes Recent Labs    12/05/20 1142  CKTOTAL 281    BNP: BNP (last 3 results) Recent Labs    07/08/20 0338 10/04/20 1109 12/03/20 1203  BNP 1,344.0* 197.9* 122.1*    ProBNP (last 3 results) No results for input(s): PROBNP in the last 8760 hours.   D-Dimer No results for input(s): DDIMER in the last 72 hours. Hemoglobin A1C No results for input(s): HGBA1C in the last 72 hours. Fasting Lipid Panel Recent Labs    12/04/20 0137  CHOL 94  HDL 25*  LDLCALC 45  TRIG 118  CHOLHDL 3.8   Thyroid Function Tests No results for input(s): TSH, T4TOTAL, T3FREE, THYROIDAB in the last 72 hours.  Invalid input(s): FREET3  Other results:   Imaging  No results found.   Medications:     Scheduled Medications:  apixaban  5 mg Oral BID   aspirin EC  81 mg Oral Daily   atorvastatin  80 mg Oral Daily   carvedilol  3.125 mg Oral BID WC   clopidogrel  75 mg Oral Daily   dapagliflozin propanediol  10 mg Oral Daily    Infusions:  amiodarone 30 mg/hr (12/06/20 1107)    PRN Medications: acetaminophen, calcium carbonate, ondansetron (ZOFRAN) IV    Patient Profile   Mr. Lenderman is a 35 y.o. male with premature CAD s/p prior PCI to LAD and CABG in 40/98, chronic systolic HF with EF 11-91% now admitted for NSTEMI.  Assessment/Plan  CAD/NSTEMI  - early onset CAD s/p MI with LAD PCI in 2012, followed by CABG  x 4 4/22  - Admit 10/04 with NSTEMI. HS troponin peaked at 263. LHC  this admission with patent LIMA to LAD, all other grafts occluded. S/p PCI DES native mid Lcx.  - Angina resolved post PCI - With addition of Eliquis, have changed DAPT regimen to ASA 81 x 1 month and Plavix x at least 6 months, ideally a year.  - Continue coreg 3.125 mg BID - On 80 mg Atorvastatin. LDL 45 this admit.   2. Chronic Systolic HF - ICM, recent Echo 8/22 EF 25-30%. - LHC with LVEDP 2 mmHg. Given 1L fluid bolus.  - Repeat echo with improvement in EF to 35-40%. Akinesis inferior and inferoseptal walls. - Cardiac MRI preliminary review showed considerably lower EF (looks 25% range) in the setting of atrial flutter. - Has been referred for ICD - outpatient EP consult scheduled later this month. I think he will need ICD.  - Appears euvolemic. Not requiring diuretics. - All HF meds initially held d/t low volume status and hypotension. Not sure how much can tolerate titration of GDMT - history of orthostatic hypotension with titration in past and also developed hyperkalemia (?hypovolemia played a role in this).   - Coreg restarted at 3.125 mg BID - Restart Farxiga 10 mg daily.  - Holding Losartan and Spironolactone for now, can reassess and gradually add back as outpatient.  3. LV thrombus - Noted on cMRI 05/22 - Anticoagulation previously stopped d/t GI bleed - No thrombus on echo 08/22 - No thrombus noted on preliminary review of cMRI this admission.   4. Hyperkalemia - K 6.1 post-cath received 2 doses of Lokelma. 6.2 yesterday. Received another dose of Lokelma and now K 4.2 today.  - Has not received spiro or losartan since admit.  - Scr has been stable - Consulted nephrology with persistent hyperkalemia in setting of normal creatinine and off all potential culprit meds.  They think that hypovolemia may play a role, and K has improved with gentle hydration.   5. Atrial flutter - New diagnosis yesterday, started on Eliquis and now s/p DCCV.  He is back in NSR.  - Can stop  amiodarone gtt and start po amiodarone.  Will continue amiodarone at least several weeks as outpatient then would like to stop (would avoid long-term).  He will followup with EP, ?consider ablation, especially if it recurs.   If he remains in NSR today, I think he can probably go home later on.  Close followup in CHF clinic and needs EP appointment.  Meds for home: Eliquis 5 mg bid, Plavix 75 daily, ASA 81 x 1 month, amiodarone 200 mg bid x 1 week then 200 mg daily, Coreg 3.125 mg  bid, Farxiga 10 mg daily, atorvastatin 80 daily.   Length of Stay: 3  Loralie Champagne, MD  12/06/2020, 12:10 PM  Advanced Heart Failure Team Pager [phone] (M-F; 7a - 5p)  Please contact [facility] for night-coverage after hours (5p -7a ) and weekends on [email or web address]

## 2020-12-06 NOTE — Plan of Care (Signed)
Vitals stable with no complaints of pain. Remains in Aflut with Amio drip running. NPO since midnight anticipating possible cardioversion today. Potassium remains in normal limits this AM.   Problem: Education: Goal: Knowledge of disease or condition will improve Outcome: Progressing Goal: Understanding of medication regimen will improve Outcome: Progressing Goal: Individualized Educational Video(s) Outcome: Progressing   Problem: Activity: Goal: Ability to tolerate increased activity will improve Outcome: Progressing   Problem: Cardiac: Goal: Ability to achieve and maintain adequate cardiopulmonary perfusion will improve Outcome: Progressing   Problem: Health Behavior/Discharge Planning: Goal: Ability to safely manage health-related needs after discharge will improve Outcome: Progressing   Problem: Education: Goal: Knowledge of General Education information will improve Description: Including pain rating scale, medication(s)/side effects and non-pharmacologic comfort measures Outcome: Progressing   Problem: Health Behavior/Discharge Planning: Goal: Ability to manage health-related needs will improve Outcome: Progressing   Problem: Clinical Measurements: Goal: Ability to maintain clinical measurements within normal limits will improve Outcome: Progressing Goal: Will remain free from infection Outcome: Progressing Goal: Diagnostic test results will improve Outcome: Progressing Goal: Respiratory complications will improve Outcome: Progressing Goal: Cardiovascular complication will be avoided Outcome: Progressing   Problem: Activity: Goal: Risk for activity intolerance will decrease Outcome: Progressing   Problem: Nutrition: Goal: Adequate nutrition will be maintained Outcome: Progressing   Problem: Coping: Goal: Level of anxiety will decrease Outcome: Progressing   Problem: Elimination: Goal: Will not experience complications related to bowel motility Outcome:  Progressing Goal: Will not experience complications related to urinary retention Outcome: Progressing   Problem: Pain Managment: Goal: General experience of comfort will improve Outcome: Progressing   Problem: Safety: Goal: Ability to remain free from injury will improve Outcome: Progressing   Problem: Skin Integrity: Goal: Risk for impaired skin integrity will decrease Outcome: Progressing

## 2020-12-06 NOTE — Progress Notes (Signed)
[facility] Progress Note   Subjective:   Feels find.  Underwent TEE and cardioversion this AM - in NSR currently.  LIkely d/c today.   Objective Vitals:   12/06/20 1122 12/06/20 1130 12/06/20 1138 12/06/20 1151  BP: 102/71 109/75 98/78 108/88  Pulse: 80 83 76   Resp: (!) 21 17 17 18   Temp: (!) 96.6 F (35.9 C)   97.8 F (36.6 C)  TempSrc: Temporal   Oral  SpO2: 100% 100% 97% 100%  Weight:      Height:       Physical Exam General: appears well in chair Heart: NSR on monitor Lungs: normal WOB on RA Extremities: no edema  Additional Objective Labs: Basic Metabolic Panel: Recent Labs  Lab 12/05/20 0454 12/05/20 1142 12/06/20 0229  NA 140 142 139  K 6.2* 3.7 4.2  CL 108 111 111  CO2 26 23 21*  GLUCOSE 87 97 99  BUN 15 14 22*  CREATININE 0.86 0.76 1.17  CALCIUM 9.0 8.7* 8.9   Liver Function Tests: No results for input(s): AST, ALT, ALKPHOS, BILITOT, PROT, ALBUMIN in the last 168 hours. No results for input(s): LIPASE, AMYLASE in the last 168 hours. CBC: Recent Labs  Lab 12/03/20 1203 12/03/20 1851 12/04/20 0137 12/06/20 0229  WBC 10.7* 7.5 7.9 7.4  HGB 15.7 14.6 14.7 15.2  HCT 50.6 47.1 47.2 46.7  MCV 90.8 89.9 89.9 88.4  PLT 303 255 234 227   Blood Culture No results found for: SDES, SPECREQUEST, CULT, REPTSTATUS  Cardiac Enzymes: Recent Labs  Lab 12/05/20 1142  CKTOTAL 281   CBG: No results for input(s): GLUCAP in the last 168 hours. Iron Studies: No results for input(s): IRON, TIBC, TRANSFERRIN, FERRITIN in the last 72 hours. @lablastinr3 @ Studies/Results: ECHOCARDIOGRAM COMPLETE  Result Date: 12/04/2020    ECHOCARDIOGRAM REPORT   Patient Name:   Wyatt Donovan Date of Exam: 12/04/2020 Medical Rec #:  [id number]        Height:       73.0 in Accession #:    [id number]       Weight:       178.4 lb Date of Birth:  [birth year]        BSA:          2.050 m Patient Age:    35 years         BP:           120/81 mmHg Patient Gender: M                 HR:           77 bpm. Exam Location:  Inpatient Procedure: 2D Echo, Cardiac Doppler and Color Doppler Indications:    CHF  History:        Patient has prior history of Echocardiogram examinations, most                 recent 10/15/2020. Cardiomyopathy, CAD and Previous Myocardial                 Infarction, Prior CABG, Signs/Symptoms:Chest Pain; Risk                 Factors:Dyslipidemia and Former Smoker. LV thrombus.  Sonographer:    Dustin Flock RDCS Referring Phys: [place]  1. No obvious apical thrombus in the region previously reported, however, this was a non-contrast study - if clinical concern, consider limited contrast echo. Left ventricular ejection fraction, by estimation,  is 35 to 40%. The left ventricle has moderately decreased function. The left ventricle demonstrates regional wall motion abnormalities (see scoring diagram/findings for description). There is mild left ventricular hypertrophy. Left ventricular diastolic parameters are consistent with Grade I diastolic dysfunction (impaired relaxation). There is severe akinesis of the left ventricular, entire inferior wall and inferoseptal wall.  2. Right ventricular systolic function is mildly reduced. The right ventricular size is normal.  3. The mitral valve is abnormal. Trivial mitral valve regurgitation.  4. The aortic valve was not well visualized. Aortic valve regurgitation is not visualized.  5. The inferior vena cava is normal in size with greater than 50% respiratory variability, suggesting right atrial pressure of 3 mmHg. Comparison(s): Changes from prior study are noted. 10/15/2020: LVEF 40-45%, inferior and inferolateral hypokinesis, LV thrombus at the anteroapex. FINDINGS  Left Ventricle: No obvious apical thrombus in the region previously reported, however, this was a non-contrast study - if clinical concern, consider limited contrast echo. Left ventricular ejection fraction, by estimation, is 35  to 40%. The left ventricle has moderately decreased function. The left ventricle demonstrates regional wall motion abnormalities. Severe akinesis of the left ventricular, entire inferior wall and inferoseptal wall. The left ventricular internal cavity size was normal in size. There is mild left ventricular hypertrophy. Left ventricular diastolic parameters are consistent with Grade I diastolic dysfunction (impaired relaxation). Indeterminate filling pressures. Right Ventricle: The right ventricular size is normal. No increase in right ventricular wall thickness. Right ventricular systolic function is mildly reduced. Left Atrium: Left atrial size was normal in size. Right Atrium: Right atrial size was normal in size. Pericardium: There is no evidence of pericardial effusion. Mitral Valve: The mitral valve is abnormal. There is mild thickening of the mitral valve leaflet(s). There is mild calcification of the mitral valve leaflet(s). Trivial mitral valve regurgitation. Tricuspid Valve: The tricuspid valve is grossly normal. Tricuspid valve regurgitation is trivial. Aortic Valve: The aortic valve was not well visualized. Aortic valve regurgitation is not visualized. Pulmonic Valve: The pulmonic valve was not well visualized. Pulmonic valve regurgitation is not visualized. Aorta: The aortic root and ascending aorta are structurally normal, with no evidence of dilitation. Venous: The inferior vena cava is normal in size with greater than 50% respiratory variability, suggesting right atrial pressure of 3 mmHg. IAS/Shunts: No atrial level shunt detected by color flow Doppler.  LEFT VENTRICLE PLAX 2D LVIDd:         5.20 cm      Diastology LVIDs:         4.30 cm      LV e' medial:    7.83 cm/s LV PW:         1.10 cm      LV E/e' medial:  7.7 LV IVS:        1.00 cm      LV e' lateral:   6.85 cm/s LVOT diam:     2.30 cm      LV E/e' lateral: 8.8 LV SV:         47 LV SV Index:   23 LVOT Area:     4.15 cm  LV Volumes (MOD) LV  vol d, MOD A4C: 129.0 ml LV vol s, MOD A4C: 80.5 ml LV SV MOD A4C:     129.0 ml RIGHT VENTRICLE RV Basal diam:  2.50 cm RV S prime:     7.29 cm/s TAPSE (M-mode): 1.4 cm LEFT ATRIUM  Index       RIGHT ATRIUM           Index LA diam:        3.20 cm 1.56 cm/m  RA Area:     11.20 cm LA Vol (A2C):   42.5 ml 20.74 ml/m RA Volume:   24.10 ml  11.76 ml/m LA Vol (A4C):   31.5 ml 15.37 ml/m LA Biplane Vol: 37.2 ml 18.15 ml/m  AORTIC VALVE LVOT Vmax:   70.50 cm/s LVOT Vmean:  45.900 cm/s LVOT VTI:    0.114 m  AORTA Ao Root diam: 3.30 cm MITRAL VALVE MV Area (PHT): 7.51 cm    SHUNTS MV Decel Time: 101 msec    Systemic VTI:  0.11 m MV E velocity: 60.00 cm/s  Systemic Diam: 2.30 cm MV A velocity: 57.40 cm/s MV E/A ratio:  1.05 Lyman Bishop MD Electronically signed by Lyman Bishop MD Signature Date/Time: 12/04/2020/2:50:19 PM    Final    Medications:   amiodarone  200 mg Oral BID   apixaban  5 mg Oral BID   aspirin EC  81 mg Oral Daily   atorvastatin  80 mg Oral Daily   carvedilol  3.125 mg Oral BID WC   clopidogrel  75 mg Oral Daily   dapagliflozin propanediol  10 mg Oral Daily     Assessment/Plan:  Wyatt Donovan is an 35 y.o. male with premature CAD s/p CABG 2012, HFrEF, HL, h/o depression currently admitted for NSTEMI s/p PCI and nephrology is consulted re: eval and management of hyperkalemia.   **Hyperkalemia, moderate:  Transient in the setting of iodinated contrast + volume depletion.  Took some time to normalize despite 3 doses of lokema but now ok after fluid bolus and holding SGLT2i.  I anticipate this won't be a persistent issue and I think it's ok to resume SGLT2i if clinically indicated otherwise, would just make sure he's not becoming volume depleted.    **Premature CAD: s/p PCI, follows with cardiology.  On statin, ASA/brilinta.  CK was normal on statin.     **HFrEF:  per cardiology. Of note - I do not think this acute hyperkalemia is a contraindication for future goal  directed heart failure therapies, but would monitor very closely for hyperkalemia.    **A fib/flutter: new onset 10/6, now cardioverted.   OK for d/c, nephrology doesn't need to follow.  Call me with concerns.     Jannifer Hick MD 12/06/2020, 1:19 PM  [facility] Pager: [phone]

## 2020-12-06 NOTE — Transfer of Care (Signed)
Immediate Anesthesia Transfer of Care Note  Patient: Wyatt Donovan  Procedure(s) Performed: CARDIOVERSION  Patient Location: Endoscopy Unit  Anesthesia Type:General  Level of Consciousness: drowsy  Airway & Oxygen Therapy: Patient Spontanous Breathing and Patient connected to face mask oxygen  Post-op Assessment: Report given to RN and Post -op Vital signs reviewed and stable  Post vital signs: Reviewed and stable  Last Vitals:  Vitals Value Taken Time  BP 102/71   Temp    Pulse 81 12/06/20 1121  Resp 19 12/06/20 1121  SpO2 100 % 12/06/20 1121  Vitals shown include unvalidated device data.  Last Pain:  Vitals:   12/06/20 1032  TempSrc: Temporal  PainSc: 0-No pain      Patients Stated Pain Goal: 0 (09/62/83 6629)  Complications: No notable events documented.

## 2020-12-06 NOTE — Anesthesia Procedure Notes (Signed)
Procedure Name: General with mask airway Date/Time: 12/06/2020 11:10 AM Performed by: Janene Harvey, CRNA Pre-anesthesia Checklist: Patient identified, Emergency Drugs available, Suction available and Patient being monitored Patient Re-evaluated:Patient Re-evaluated prior to induction Oxygen Delivery Method: Ambu bag Preoxygenation: Pre-oxygenation with 100% oxygen Induction Type: IV induction Placement Confirmation: positive ETCO2 Dental Injury: Teeth and Oropharynx as per pre-operative assessment

## 2020-12-06 NOTE — Procedures (Signed)
Electrical Cardioversion Procedure Note Wyatt Donovan [id number] [birth year]  Procedure: Electrical Cardioversion Indications:  Atrial Fibrillation  Procedure Details Consent: Risks of procedure as well as the alternatives and risks of each were explained to the (patient/caregiver).  Consent for procedure obtained. Time Out: Verified patient identification, verified procedure, site/side was marked, verified correct patient position, special equipment/implants available, medications/allergies/relevent history reviewed, required imaging and test results available.  Performed  Patient placed on cardiac monitor, pulse oximetry, supplemental oxygen as necessary.  Sedation given:  Propofol per anesthesiology Pacer pads placed anterior and posterior chest.  Cardioverted 1 time(s).  Cardioverted at [place].  Evaluation Findings: Post procedure EKG shows: NSR Complications: None Patient did tolerate procedure well.   Loralie Champagne 12/06/2020, 11:17 AM

## 2020-12-06 NOTE — Anesthesia Preprocedure Evaluation (Addendum)
Anesthesia Evaluation  Patient identified by MRN, date of birth, ID band Patient awake    Reviewed: Allergy & Precautions, Patient's Chart, lab work & pertinent test results, reviewed documented beta blocker date and time   History of Anesthesia Complications Negative for: history of anesthetic complications  Airway Mallampati: II  TM Distance: >3 FB Neck ROM: Full    Dental  (+) Poor Dentition, Missing   Pulmonary former smoker,    Pulmonary exam normal        Cardiovascular hypertension, Pt. on medications and Pt. on home beta blockers + CAD, + Past MI, + CABG (07/03/20) and +CHF  + dysrhythmias Atrial Fibrillation  Rhythm:Irregular Rate:Normal  TTE 12/04/20: no obvious apical thrombus in the region previously reported, however, this was a non-contrast study, EF 35-40%, mild LVH, grade I DD, severe akinesis of the left ventricular, entire inferior wall and inferoseptal wall, RV systolic function mildly reduced     Neuro/Psych negative neurological ROS  negative psych ROS   GI/Hepatic negative GI ROS, Neg liver ROS,   Endo/Other  negative endocrine ROS  Renal/GU negative Renal ROS  negative genitourinary   Musculoskeletal negative musculoskeletal ROS (+)   Abdominal (+)  Abdomen: soft.    Peds  Hematology negative hematology ROS (+)   Anesthesia Other Findings Day of surgery medications reviewed with patient.  Reproductive/Obstetrics negative OB ROS                            Anesthesia Physical Anesthesia Plan  ASA: 4  Anesthesia Plan: General   Post-op Pain Management:    Induction: Intravenous  PONV Risk Score and Plan: Treatment may vary due to age or medical condition and Propofol infusion  Airway Management Planned: Mask  Additional Equipment: None  Intra-op Plan:   Post-operative Plan:   Informed Consent: I have reviewed the patients History and Physical, chart, labs  and discussed the procedure including the risks, benefits and alternatives for the proposed anesthesia with the patient or authorized representative who has indicated his/her understanding and acceptance.     Dental advisory given  Plan Discussed with: CRNA  Anesthesia Plan Comments:        Anesthesia Quick Evaluation

## 2020-12-06 NOTE — Progress Notes (Signed)
CARDIAC REHAB PHASE I   PRE:  Rate/Rhythm: 80 first degree    BP: sitting 102/82    SaO2: 100 RA  MODE:  Ambulation: 1100 ft   POST:  Rate/Rhythm: 100 ST    BP: sitting 130/95, retake 118/92     SaO2: 100 RA  Tolerated very well, no c/o. Independent with his cane. No questions. [place], ACSM 12/06/2020 1:59 PM

## 2020-12-08 ENCOUNTER — Encounter (HOSPITAL_COMMUNITY): Payer: Self-pay | Admitting: Cardiology

## 2020-12-10 NOTE — Progress Notes (Signed)
Advanced Heart Failure Clinic Note   Primary Care: Leonie Douglas, MD HF Cardiologist: [name]  HPI: Patient is a 35 y.o. with history of early onset CAD s/p CABG, ischemic cardiomyopathy, and LV thrombus. Patient had initial MI in 2012 at age 44, PCI to LAD.  He had inferoposterior MI in 4/22.  LHC showed 3 vessel disease, and patient had CABG x 4. Cardiac MRI in 5/22 showed LV EF 27% with LV thrombus, there was significant viability.  Post-op, patient had GI bleeding and anticoagulation was stopped.  He quit smoking after CABG.    Follow up 10/04/20 carvedilol decreased due to dizziness and repeat echo arranged.   Echo 8/22 EF 25-30%. He was referred to EP for ICD consideration. Awaiting initial consultation.   Admitted 12/03/20 with NSTEMI. Coronary angiogram showed patent LIMA to LAD with occlusion of all other grafts. S/p PCI/DES to native mid LCX. LVEDP 2 mmmHg. Given 1L fluid bolus. CHF medications held d/t hypotension. Angina resolved post PCI. Farxiga and Coreg added back, however spiro and losartan held due to hyperkalemia. His hospitalization was complicated by atrial fibrillation with RVR on 10/6. He received IV amiodarone and started on Eliquis.  He underwent DCCV with conversion to NSR on 12/06/20. Discharged home 12/06/20, weight 177.5 lbs.  Today he returns for post hospitalization HF follow up. No further CP, walking with cane for balance. Overall feeling fine. Denies increasing SOB, CP, dizziness, edema, or PND/Orthopnea. Appetite ok. No fever or chills. Weight at home 177 pounds. Taking all medications.   ECG (personally reviewed): sinus rhythm with 1st degree AVB, rBBB   PMH: 1. Hyperlipidemia 2. CAD: MI at age 79 in 2012, PCI to LAD.   - Inferoposterior MI in 4/22: LHC showed 3 vessel disease.  CABG with LIMA-LAD, radial to OM1, sequential SVG-PDA and D1.  - NSTEMI-->LHC (10/22): patent LIMA to LAD, all other grafts occluded. S/p PCI DES native mid Lcx 3.  Post-operative GI bleeding after CABG 4. Chronic systolic CHF: Ischemic cardiomyopathy.   - Cardiac MRI (5/22): LV EF 27%, RV EF 57%, apical thrombus, extensive viability.  - Echo (8/22) EF 20-25% - Echo (10/22): EF 35-40% - cMRI (10/22): LVEF 29%, RV EF 45%, no LV thrombus, LGE> 50% transmural 5. LV thrombus  - No thrombus on echo 8/22.  Past Medical History:  Diagnosis Date   Atypical chest pain    CAD (coronary artery disease)    Dyslipidemia    Ex-smoker    History of depression    Insomnia    Ischemic cardiomyopathy    Ejection fraction 40-45%   NSTEMI (non-ST elevated myocardial infarction) ([place]) 08/2007   Treated with a bare metal stent to the proximal LAD   Suicide attempt ([place]) 01/2001    Current Outpatient Medications  Medication Sig Dispense Refill   acetaminophen (TYLENOL) 500 MG tablet Take 1,000 mg by mouth every 6 (six) hours as needed for mild pain or headache.     amiodarone (PACERONE) 200 MG tablet Take 1 tablet (200 mg total) by mouth 2 (two) times daily. 60 tablet 6   apixaban (ELIQUIS) 5 MG TABS tablet Take 1 tablet (5 mg total) by mouth 2 (two) times daily. 60 tablet 6   aspirin 81 MG EC tablet Take 1 tablet (81 mg total) by mouth daily. Swallow whole. 30 tablet 11   atorvastatin (LIPITOR) 80 MG tablet TAKE 1 Tablet BY MOUTH ONCE DAILY 30 tablet 2   carvedilol (COREG) 3.125 MG tablet Take 1 tablet (  3.125 mg total) by mouth 2 (two) times daily with a meal. 180 tablet 3   clopidogrel (PLAVIX) 75 MG tablet Take 1 tablet (75 mg total) by mouth daily. 30 tablet 6   dapagliflozin propanediol (FARXIGA) 10 MG TABS tablet Take 1 tablet (10 mg total) by mouth daily before breakfast. 30 tablet 2   nitroGLYCERIN (NITROSTAT) 0.4 MG SL tablet Place 0.4 mg under the tongue every 5 (five) minutes as needed for chest pain.     No current facility-administered medications for this encounter.   Allergies  Allergen Reactions   Codeine Itching   Social History    Socioeconomic History   Marital status: Single    Spouse name: Not on file   Number of children: Not on file   Years of education: Not on file   Highest education level: High school graduate  Occupational History   Occupation: Not actively working    Comment: applied for disability x 2.   Tobacco Use   Smoking status: Former    Packs/day: 1.00    Years: 22.00    Pack years: 22.00    Types: Cigarettes, Cigars    Quit date: 06/28/2020    Years since quitting: 0.4   Smokeless tobacco: Never   Tobacco comments:    cigarettes stopped in 2020, [employer] started in 2018-04-05/day  Vaping Use   Vaping Use: Never used  Substance and Sexual Activity   Alcohol use: Never   Drug use: Not Currently    Types: Marijuana   Sexual activity: Never  Other Topics Concern   Not on file  Social History Narrative   Single   Lives with his parents   Trying to get his GED   DeGent date all cultures   Social Determinants of Health   Financial Resource Strain: Medium Risk   Difficulty of Paying Living Expenses: Somewhat hard  Food Insecurity: No Food Insecurity   Worried About Charity fundraiser in the Last Year: Never true   Ran Out of Food in the Last Year: Never true  Transportation Needs: No Transportation Needs   Lack of Transportation (Medical): No   Lack of Transportation (Non-Medical): No  Physical Activity: Not on file  Stress: Not on file  Social Connections: Not on file  Intimate Partner Violence: Not on file   Family History  Problem Relation Age of Onset   Hypertension Neg Hx    Diabetes Neg Hx    Coronary artery disease Neg Hx    BP 118/74   Pulse 72   Wt 81.1 kg (178 lb 12.8 oz)   SpO2 98%   BMI 23.59 kg/m   Wt Readings from Last 3 Encounters:  12/11/20 81.1 kg (178 lb 12.8 oz)  12/03/20 80.7 kg (178 lb)  12/03/20 80.9 kg (178 lb 6.4 oz)   PHYSICAL EXAM: General:  NAD. No resp difficulty, thin HEENT: Normal Neck: Supple. No JVD. Carotids 2+ bilat; no  bruits. No lymphadenopathy or thryomegaly appreciated. Cor: PMI nondisplaced. Regular rate & rhythm. No rubs, gallops or murmurs. Lungs: Clear Abdomen: Soft, nontender, nondistended. No hepatosplenomegaly. No bruits or masses. Good bowel sounds. Extremities: No cyanosis, clubbing, rash, edema Neuro: Alert & oriented x 3, cranial nerves grossly intact. Moves all 4 extremities w/o difficulty. Affect pleasant.  ASSESSMENT & PLAN:  1. CAD: early onset CAD s/p MI with LAD PCI in 2012,followed by CABG  x 4 4/22  Admit 10/22 with NSTEMI. LHC this admission with patent LIMA to LAD, all  other grafts occluded. S/p PCI DES native mid Lcx. No further chest pain. - With addition of Eliquis, continue ASA 81 x 1 month and Plavix x at least 6 months, ideally a year.  - Continue coreg 3.125 mg bid. - Continue atorvastatin 80 mg  LDL 45. 2. Chronic Systolic CHF: Ischemic cardiomyopathy. Recent Echo 8/22 EF 25-30%. Repeat echo (10/22) with improvement in EF to 35-40%. Akinesis inferior and inferoseptal walls. Cardiac MRI preliminary review showed considerably lower EF (looks 25% range) in the setting of atrial flutter. - Has been referred for ICD -  I think he will need ICD. GDMT has been limited by hypotension & hyperkalemia. He is not volume overloaded today, stable NYHA II symptoms. - Start losartan 12.5 mg daily. BMET today, repeat in 1 week. - Continue Coreg 3.125 mg bid. - Continue Farxiga 10 mg daily.  - Add spiro next as able. - Defer CR until after ICD. 3. 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 Anticoagulation previously stopped d/t GI bleed. No thrombus on echo 08/22. No thrombus on cMRI this admission. 4. Hyperkalemia: Received Lokelma during recent admission. SCr stable. Nephrology consulted and felt hypovolemia may play a role. - BMET today. Will follow labs closely. 5. Atrial flutter: New diagnosis this admission. Now s/p DCCV. SR on ECG today. - Continue Eliquis 5 mg bid. No bleeding issues. CBC  today. - Continue amiodarone 200 mg bid. Decrease to 200 mg daily on 12/13/20. Will continue amiodarone at least several weeks then would like to stop (would avoid long-term).   - He will followup with EP, ?consider ablation, especially if it recurs.   Follow up in 3-4 weeks with APP.  Allena Katz, FNP-BC 12/11/20

## 2020-12-11 ENCOUNTER — Encounter (HOSPITAL_COMMUNITY): Payer: Self-pay

## 2020-12-11 ENCOUNTER — Ambulatory Visit (HOSPITAL_COMMUNITY)
Admit: 2020-12-11 | Discharge: 2020-12-11 | Disposition: A | Discharge status: Home / Self Care | Payer: Medicaid Other | Attending: Family Medicine | Admitting: Family Medicine

## 2020-12-11 ENCOUNTER — Other Ambulatory Visit: Payer: Self-pay

## 2020-12-11 VITALS — BP 118/74 | HR 72 | Wt 178.8 lb

## 2020-12-11 DIAGNOSIS — I959 Hypotension, unspecified: Secondary | ICD-10-CM | POA: Diagnosis not present

## 2020-12-11 DIAGNOSIS — Z79899 Other long term (current) drug therapy: Secondary | ICD-10-CM | POA: Insufficient documentation

## 2020-12-11 DIAGNOSIS — E782 Mixed hyperlipidemia: Secondary | ICD-10-CM

## 2020-12-11 DIAGNOSIS — I25119 Atherosclerotic heart disease of native coronary artery with unspecified angina pectoris: Secondary | ICD-10-CM | POA: Diagnosis not present

## 2020-12-11 DIAGNOSIS — Z951 Presence of aortocoronary bypass graft: Secondary | ICD-10-CM

## 2020-12-11 DIAGNOSIS — I2510 Atherosclerotic heart disease of native coronary artery without angina pectoris: Secondary | ICD-10-CM | POA: Diagnosis not present

## 2020-12-11 DIAGNOSIS — Z955 Presence of coronary angioplasty implant and graft: Secondary | ICD-10-CM | POA: Insufficient documentation

## 2020-12-11 DIAGNOSIS — I5022 Chronic systolic (congestive) heart failure: Secondary | ICD-10-CM

## 2020-12-11 DIAGNOSIS — Z7901 Long term (current) use of anticoagulants: Secondary | ICD-10-CM | POA: Diagnosis not present

## 2020-12-11 DIAGNOSIS — Z7984 Long term (current) use of oral hypoglycemic drugs: Secondary | ICD-10-CM | POA: Insufficient documentation

## 2020-12-11 DIAGNOSIS — Z885 Allergy status to narcotic agent status: Secondary | ICD-10-CM | POA: Diagnosis not present

## 2020-12-11 DIAGNOSIS — Z596 Low income: Secondary | ICD-10-CM | POA: Insufficient documentation

## 2020-12-11 DIAGNOSIS — E875 Hyperkalemia: Secondary | ICD-10-CM | POA: Insufficient documentation

## 2020-12-11 DIAGNOSIS — Z87891 Personal history of nicotine dependence: Secondary | ICD-10-CM | POA: Insufficient documentation

## 2020-12-11 DIAGNOSIS — Z7902 Long term (current) use of antithrombotics/antiplatelets: Secondary | ICD-10-CM | POA: Diagnosis not present

## 2020-12-11 DIAGNOSIS — I4892 Unspecified atrial flutter: Secondary | ICD-10-CM | POA: Diagnosis not present

## 2020-12-11 DIAGNOSIS — Z7982 Long term (current) use of aspirin: Secondary | ICD-10-CM | POA: Insufficient documentation

## 2020-12-11 DIAGNOSIS — I252 Old myocardial infarction: Secondary | ICD-10-CM | POA: Diagnosis not present

## 2020-12-11 DIAGNOSIS — I255 Ischemic cardiomyopathy: Secondary | ICD-10-CM | POA: Insufficient documentation

## 2020-12-11 LAB — CBC
HCT: 49 % (ref 39.0–52.0)
Hemoglobin: 15.1 g/dL (ref 13.0–17.0)
MCH: 28.1 pg (ref 26.0–34.0)
MCHC: 30.8 g/dL (ref 30.0–36.0)
MCV: 91.1 fL (ref 80.0–100.0)
Platelets: 271 10*3/uL (ref 150–400)
RBC: 5.38 MIL/uL (ref 4.22–5.81)
RDW: 17.5 % — ABNORMAL HIGH (ref 11.5–15.5)
WBC: 9.4 10*3/uL (ref 4.0–10.5)
nRBC: 0 % (ref 0.0–0.2)

## 2020-12-11 LAB — BASIC METABOLIC PANEL
Anion gap: 8 (ref 5–15)
BUN: 12 mg/dL (ref 6–20)
CO2: 27 mmol/L (ref 22–32)
Calcium: 9.6 mg/dL (ref 8.9–10.3)
Chloride: 107 mmol/L (ref 98–111)
Creatinine, Ser: 0.93 mg/dL (ref 0.61–1.24)
GFR, Estimated: >60 mL/min (ref >60)
Glucose, Bld: 82 mg/dL (ref 70–99)
Potassium: 5.3 mmol/L — ABNORMAL HIGH (ref 3.5–5.1)
Sodium: 142 mmol/L (ref 135–145)

## 2020-12-11 MED ORDER — AMIODARONE HCL 200 MG PO TABS: 200.0000 mg | ORAL_TABLET | Freq: Every day | ORAL | 6 refills | Status: DC

## 2020-12-11 MED ORDER — LOSARTAN POTASSIUM 25 MG PO TABS: 12.5000 mg | ORAL_TABLET | Freq: Every day | ORAL | 3 refills | Status: DC

## 2020-12-11 NOTE — Patient Instructions (Signed)
DECREASE Amiodarone to 200 mg, one tab daily starting 12/13/2020  RESTART Losartan 12.5mg , one half tab daily  Labs today We will only contact you if something comes back abnormal or we need to make some changes. Otherwise no news is good news!  Labs needed in 7-10 days  Keep follow up as scheduled  Do the following things EVERYDAY: Weigh yourself in the morning before breakfast. Write it down and keep it in a log. Take your medicines as prescribed Eat low salt foods--Limit salt (sodium) to 2000 mg per day.  Stay as active as you can everyday Limit all fluids for the day to less than 2 liters  At the [facility], you and your health needs are our priority. As part of our continuing mission to provide you with exceptional heart care, we have created designated Provider Care Teams. These Care Teams include your primary Cardiologist (physician) and Advanced Practice Providers (APPs- Physician Assistants and Nurse Practitioners) who all work together to provide you with the care you need, when you need it.   You may see any of the following providers on your designated Care Team at your next follow up: Dr Glori Bickers Dr Loralie Champagne Dr Patrice Paradise, NP Lyda Jester, [place] Ginnie Smart Audry Riles, PharmD   Please be sure to bring in all your medications bottles to every appointment.   If you have any questions or concerns before your next appointment please send [place] a message through [place] or call our office at [phone].    TO LEAVE A MESSAGE FOR THE NURSE SELECT OPTION 2, PLEASE LEAVE A MESSAGE INCLUDING: YOUR NAME DATE OF BIRTH CALL BACK NUMBER REASON FOR CALL**this is important as we prioritize the call backs  YOU WILL RECEIVE A CALL BACK THE SAME DAY AS LONG AS YOU CALL BEFORE 4:00 PM

## 2020-12-12 ENCOUNTER — Telehealth: Payer: Self-pay | Admitting: Pharmacist

## 2020-12-12 ENCOUNTER — Other Ambulatory Visit: Payer: Self-pay

## 2020-12-12 NOTE — Telephone Encounter (Signed)
Pharmacy Transitions of Care Follow-up Telephone Call  Date of discharge: 12/06/2020  Discharge Diagnosis:  CAD/NSTEMI Chronic systolic HF LV thrombus  Hyperkalemia Atrial fibrillation with RVR   How have you been since you were released from the hospital? Mr. Stapel said he is doing good since discharge from the hospital. He had a follow up appointment yesterday at [facility].   Medication changes made at discharge:  - START: Brilinta, now changed to Eliquis 5mg   - STOPPED: Isosorbide mononitrate 10mg , losartan 25mg , spironolactone 25mg   - CHANGED: n/a  Medication changes verified by the patient? yes    Medication Accessibility:  Home Pharmacy: [facility],they mail him 3 months of his medications   Was the patient provided with refills on discharged medications? yes   Have all prescriptions been transferred from [facility] to home pharmacy? No, patient's medications were changed at follow with cardiology 12/11/20   Is the patient able to afford medications? Did not discuss with patient.  He states he uses [facility] as his pharmacy. Notable copays: Not discussed Eligible patient assistance: Medicaid Insurance    Medication Review: Medication was changed at follow up appointment to Eliquis APIXABAN (ELIQUIS)  Apixaban 5 mg BID. - Discussed importance of taking medication around the same time everyday  - Reviewed potential DDIs with patient  - Advised patient of medications to avoid (NSAIDs, ASA)  - Educated that Tylenol (acetaminophen) will be the preferred analgesic to prevent risk of bleeding  - Emphasized importance of monitoring for signs and symptoms of bleeding (abnormal bruising, prolonged bleeding, nose bleeds, bleeding from gums, discolored urine, black tarry stools)  - Advised patient to alert all providers of anticoagulation therapy prior to starting a new medication or having a procedure   Follow-up Appointments: Follow  up Visit with [facility] 12/11/20  Lab visit on 12/20/20   If their condition worsens, is the pt aware to call PCP or go to the Emergency Dept.? yes  Final Patient Assessment: Mr. Depaolo was seen yesterday at [facility].  Some of his medications were changed.  He states he is not on Brilinta but taking Eliquis.  He started Clopidogrel on 12/07/20.  He is not taking losartan.  He will decrease his amiodarone dose on 12/13/20 to 1 tablet daily. He has an appointment for labs on 12/20/20.  He is taking his medication daily and weighing himself.  I advised patient to call [facility], his mail order pharmacy, to verify they have his new medication orders.  We talked about adherence and not missing any of his doses.

## 2020-12-20 ENCOUNTER — Other Ambulatory Visit: Payer: Self-pay

## 2020-12-20 ENCOUNTER — Ambulatory Visit (HOSPITAL_COMMUNITY)
Admission: RE | Admit: 2020-12-20 | Discharge: 2020-12-20 | Disposition: A | Discharge status: Home / Self Care | Payer: Medicaid Other | Source: Ambulatory Visit | Attending: Cardiology | Admitting: Cardiology

## 2020-12-20 DIAGNOSIS — I5022 Chronic systolic (congestive) heart failure: Secondary | ICD-10-CM | POA: Insufficient documentation

## 2020-12-20 LAB — BASIC METABOLIC PANEL
Anion gap: 5 (ref 5–15)
BUN: 11 mg/dL (ref 6–20)
CO2: 29 mmol/L (ref 22–32)
Calcium: 9.5 mg/dL (ref 8.9–10.3)
Chloride: 106 mmol/L (ref 98–111)
Creatinine, Ser: 0.95 mg/dL (ref 0.61–1.24)
GFR, Estimated: >60 mL/min (ref >60)
Glucose, Bld: 96 mg/dL (ref 70–99)
Potassium: 4.5 mmol/L (ref 3.5–5.1)
Sodium: 140 mmol/L (ref 135–145)

## 2020-12-24 ENCOUNTER — Institutional Professional Consult (permissible substitution): Payer: Medicaid Other | Admitting: Internal Medicine

## 2020-12-30 NOTE — Progress Notes (Signed)
Advanced Heart Failure Clinic Note   Primary Care: Leonie Douglas, MD HF Cardiologist: [name] EP: Dr Quentin Ore   HPI: Patient is a 35 y.o. with history of early onset CAD s/p CABG, ischemic cardiomyopathy, and LV thrombus. Patient had initial MI in 2012 at age 27, PCI to LAD.  He had inferoposterior MI in 4/22.  LHC showed 3 vessel disease, and patient had CABG x 4. Cardiac MRI in 5/22 showed LV EF 27% with LV thrombus, there was significant viability.  Post-op, patient had GI bleeding and anticoagulation was stopped.  He quit smoking after CABG.    Follow up 10/04/20 carvedilol decreased due to dizziness and repeat echo arranged.   Echo 8/22 EF 25-30%. He was referred to EP for ICD consideration. Awaiting initial consultation.   Admitted 12/03/20 with NSTEMI. Coronary angiogram showed patent LIMA to LAD with occlusion of all other grafts. S/p PCI/DES to native mid LCX. LVEDP 2 mmmHg. Given 1L fluid bolus. CHF medications held d/t hypotension. Angina resolved post PCI. Farxiga and Coreg added back, however spiro and losartan held due to hyperkalemia. His hospitalization was complicated by atrial fibrillation with RVR on 10/6. He received IV amiodarone and started on Eliquis.  He underwent DCCV with conversion to NSR on 12/06/20. Discharged home 12/06/20, weight 177.5 lbs.  Last visit losartan was going to be started however K 5.3. Looking back he typically runs > 5.    Today he returns for HF follow up.  Overall feeling fine.  Uses cane due to fatigue when walking. . Mild SOB with exertion. Denies PND/Orthopnea. NO BRBPR. No bleeding issues. No chest pain. Appetite ok. No fever or chills. Weight at home 178-180 pounds. Taking all medications. Currently not working and has no income. Lives with his Dad.    PMH: 1. Hyperlipidemia 2. CAD: MI at age 54 in 2012, PCI to LAD.   - Inferoposterior MI in 4/22: LHC showed 3 vessel disease.  CABG with LIMA-LAD, radial to OM1, sequential SVG-PDA and  D1.  - NSTEMI-->LHC (10/22): patent LIMA to LAD, all other grafts occluded. S/p PCI DES native mid Lcx 3. Post-operative GI bleeding after CABG 4. Chronic systolic CHF: Ischemic cardiomyopathy.   - Cardiac MRI (5/22): LV EF 27%, RV EF 57%, apical thrombus, extensive viability.  - Echo (8/22) EF 20-25% - Echo (10/22): EF 35-40% - cMRI (10/22): LVEF 29%, RV EF 45%, no LV thrombus, LGE> 50% transmural 5. LV thrombus  - No thrombus on echo 8/22.  Past Medical History:  Diagnosis Date   Atypical chest pain    CAD (coronary artery disease)    Dyslipidemia    Ex-smoker    History of depression    Insomnia    Ischemic cardiomyopathy    Ejection fraction 40-45%   NSTEMI (non-ST elevated myocardial infarction) ([place]) 08/2007   Treated with a bare metal stent to the proximal LAD   Suicide attempt ([place]) 01/2001    Current Outpatient Medications  Medication Sig Dispense Refill   acetaminophen (TYLENOL) 500 MG tablet Take 1,000 mg by mouth every 6 (six) hours as needed for mild pain or headache.     amiodarone (PACERONE) 200 MG tablet Take 1 tablet (200 mg total) by mouth daily. 30 tablet 6   apixaban (ELIQUIS) 5 MG TABS tablet Take 1 tablet (5 mg total) by mouth 2 (two) times daily. 60 tablet 6   aspirin 81 MG EC tablet Take 1 tablet (81 mg total) by mouth daily. Swallow whole. 30 tablet  11   atorvastatin (LIPITOR) 80 MG tablet TAKE 1 Tablet BY MOUTH ONCE DAILY 30 tablet 2   carvedilol (COREG) 3.125 MG tablet Take 1 tablet (3.125 mg total) by mouth 2 (two) times daily with a meal. 180 tablet 3   clopidogrel (PLAVIX) 75 MG tablet Take 1 tablet (75 mg total) by mouth daily. 30 tablet 6   dapagliflozin propanediol (FARXIGA) 10 MG TABS tablet Take 1 tablet (10 mg total) by mouth daily before breakfast. 30 tablet 2   nitroGLYCERIN (NITROSTAT) 0.4 MG SL tablet Place 0.4 mg under the tongue every 5 (five) minutes as needed for chest pain.     No current facility-administered medications for this  encounter.   Allergies  Allergen Reactions   Codeine Itching   Social History   Socioeconomic History   Marital status: Single    Spouse name: Not on file   Number of children: Not on file   Years of education: Not on file   Highest education level: High school graduate  Occupational History   Occupation: Not actively working    Comment: applied for disability x 2.   Tobacco Use   Smoking status: Former    Packs/day: 1.00    Years: 22.00    Pack years: 22.00    Types: Cigarettes, Cigars    Quit date: 06/28/2020    Years since quitting: 0.5   Smokeless tobacco: Never   Tobacco comments:    cigarettes stopped in 2020, [employer] started in 2018-04-05/day  Vaping Use   Vaping Use: Never used  Substance and Sexual Activity   Alcohol use: Never   Drug use: Not Currently    Types: Marijuana   Sexual activity: Never  Other Topics Concern   Not on file  Social History Narrative   Single   Lives with his parents   Trying to get his GED   DeGent date all cultures   Social Determinants of Health   Financial Resource Strain: Medium Risk   Difficulty of Paying Living Expenses: Somewhat hard  Food Insecurity: No Food Insecurity   Worried About Charity fundraiser in the Last Year: Never true   Ran Out of Food in the Last Year: Never true  Transportation Needs: No Transportation Needs   Lack of Transportation (Medical): No   Lack of Transportation (Non-Medical): No  Physical Activity: Not on file  Stress: Not on file  Social Connections: Not on file  Intimate Partner Violence: Not on file   Family History  Problem Relation Age of Onset   Hypertension Neg Hx    Diabetes Neg Hx    Coronary artery disease Neg Hx    BP 100/72   Pulse 76   Wt 82.8 kg (182 lb 9.6 oz)   SpO2 98%   BMI 24.09 kg/m   Wt Readings from Last 3 Encounters:  12/31/20 82.8 kg (182 lb 9.6 oz)  12/11/20 81.1 kg (178 lb 12.8 oz)  12/03/20 80.7 kg (178 lb)   General:  Walked in the clinic  with cane. No resp difficulty HEENT: normal Neck: supple. no JVD. Carotids 2+ bilat; no bruits. No lymphadenopathy or thryomegaly appreciated. Cor: PMI nondisplaced. Regular rate & rhythm. No rubs, gallops or murmurs. Lungs: clear Abdomen: soft, nontender, nondistended. No hepatosplenomegaly. No bruits or masses. Good bowel sounds. Extremities: no cyanosis, clubbing, rash, edema Neuro: alert & orientedx3, cranial nerves grossly intact. moves all 4 extremities w/o difficulty. Affect pleasant  EKG: SR with 1st degree heart block. QRS  118 ms   ASSESSMENT & PLAN:  1. CAD: early onset CAD s/p MI with LAD PCI in 2012,followed by CABG  x 4 4/22  Admit 10/22 with NSTEMI. LHC this admission with patent LIMA to LAD, all other grafts occluded. S/p PCI DES native mid Lcx. No chest pain.  - With addition of Eliquis, continue ASA 81 x 1 month (stop 01/06/21) and Plavix x at least 6 months, ideally a year.  - Continue coreg 3.125 mg bid. - Continue atorvastatin 80 mg  LDL 45. 2. Chronic Systolic CHF: Ischemic cardiomyopathy. Recent Echo 8/22 EF 25-30%. Repeat echo (10/22) with improvement in EF to 35-40%. Akinesis inferior and inferoseptal walls.  -(12/05/20) Cardiac MRI EF 29%  in the setting of atrial flutter. - NYHA III. Volume status stable.  - Has been referred for ICD . He has an appointment with Dr Quentin Ore next month 01/07/21. Narrow QRS.   GDMT has been limited by hypotension & hyperkalemia.  - Unable to start losartan and spironolactone due to hyperkalemia - Continue Coreg 3.125 mg bid.No room to increase.  - Continue Farxiga 10 mg daily.  3. 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 Anticoagulation previously stopped d/t GI bleed. No thrombus on echo 08/22. No thrombus on cMRI 12/06/20 . - Continue eliquis 5 mg twice a day.  4. Hyperkalemia: Received Lokelma during recent admission.  Look back K usually runs >5.  5. Atrial flutter: New diagnosis this admission. Now s/p DCCV.  - In SR today.  - Continue  amio 200 mg daily. Not ideal long term. Has follow up with Dr Quentin Ore next month.  TSH followed at PCP. Discussed yearly eye exams. I have asked him forward labs drawn by PCP to our office. He has follow up next week with PCP Continue Eliquis 5 mg bid. No bleeding issues.  - He will followup with EP, ?consider ablation, especially if it recurs.  6. Former Smoker Last cigarette 06/30/20  He has been referred to cardiac rehab and waiting to start after ICD.  Referred to SW for assistance with disability.   Follow up with Dr Aundra Dubin in 2 months. Greater than 50% of the (total minutes 25) visit spent in counseling/coordination of care regarding the above.    Codee Bloodworth NP-C  10:00 AM 12/31/20

## 2020-12-31 ENCOUNTER — Encounter (HOSPITAL_COMMUNITY): Payer: Self-pay

## 2020-12-31 ENCOUNTER — Ambulatory Visit (HOSPITAL_COMMUNITY)
Admission: RE | Admit: 2020-12-31 | Discharge: 2020-12-31 | Disposition: A | Discharge status: Home / Self Care | Payer: Medicaid Other | Source: Ambulatory Visit | Attending: Family Medicine | Admitting: Family Medicine

## 2020-12-31 ENCOUNTER — Other Ambulatory Visit: Payer: Self-pay

## 2020-12-31 VITALS — BP 100/72 | HR 76 | Wt 182.6 lb

## 2020-12-31 DIAGNOSIS — Z79899 Other long term (current) drug therapy: Secondary | ICD-10-CM | POA: Diagnosis not present

## 2020-12-31 DIAGNOSIS — I25119 Atherosclerotic heart disease of native coronary artery with unspecified angina pectoris: Secondary | ICD-10-CM | POA: Diagnosis not present

## 2020-12-31 DIAGNOSIS — I5022 Chronic systolic (congestive) heart failure: Secondary | ICD-10-CM | POA: Diagnosis not present

## 2020-12-31 DIAGNOSIS — Z87891 Personal history of nicotine dependence: Secondary | ICD-10-CM | POA: Diagnosis not present

## 2020-12-31 DIAGNOSIS — Z8249 Family history of ischemic heart disease and other diseases of the circulatory system: Secondary | ICD-10-CM | POA: Diagnosis not present

## 2020-12-31 DIAGNOSIS — Z139 Encounter for screening, unspecified: Secondary | ICD-10-CM

## 2020-12-31 DIAGNOSIS — E875 Hyperkalemia: Secondary | ICD-10-CM | POA: Diagnosis not present

## 2020-12-31 DIAGNOSIS — Z951 Presence of aortocoronary bypass graft: Secondary | ICD-10-CM | POA: Insufficient documentation

## 2020-12-31 DIAGNOSIS — I4892 Unspecified atrial flutter: Secondary | ICD-10-CM | POA: Diagnosis not present

## 2020-12-31 DIAGNOSIS — Z885 Allergy status to narcotic agent status: Secondary | ICD-10-CM | POA: Diagnosis not present

## 2020-12-31 DIAGNOSIS — Z7982 Long term (current) use of aspirin: Secondary | ICD-10-CM | POA: Diagnosis not present

## 2020-12-31 DIAGNOSIS — Z7984 Long term (current) use of oral hypoglycemic drugs: Secondary | ICD-10-CM | POA: Diagnosis not present

## 2020-12-31 DIAGNOSIS — Z7902 Long term (current) use of antithrombotics/antiplatelets: Secondary | ICD-10-CM | POA: Insufficient documentation

## 2020-12-31 DIAGNOSIS — Z7901 Long term (current) use of anticoagulants: Secondary | ICD-10-CM | POA: Insufficient documentation

## 2020-12-31 DIAGNOSIS — Z955 Presence of coronary angioplasty implant and graft: Secondary | ICD-10-CM | POA: Insufficient documentation

## 2020-12-31 DIAGNOSIS — I255 Ischemic cardiomyopathy: Secondary | ICD-10-CM

## 2020-12-31 MED ORDER — CLOPIDOGREL BISULFATE 75 MG PO TABS: 75.0000 mg | ORAL_TABLET | Freq: Every day | ORAL | 6 refills | Status: DC

## 2020-12-31 MED ORDER — ASPIRIN 81 MG PO TBEC: 81.0000 mg | DELAYED_RELEASE_TABLET | Freq: Every day | ORAL | Status: AC

## 2020-12-31 NOTE — Progress Notes (Signed)
Heart and Vascular Care Navigation  12/31/2020  Wyatt Donovan 30-Apr-1985 [id number]  Reason for Referral: Patient seen in clinic for assistance with disability.   Engaged with patient face to face for follow up visit for Heart and Vascular Care Coordination.                                                                                                   Assessment:  Patient is a 35 yo male who resides at home with his father. Patient reports he has worked on and off over the years and now unable to work. Patient reports that he applied for disability through the [facility] and was denied for disability (SSD) because he had not work enough quarters to qualify. Patient unsure if he has an SSI application now pending. Patient reports he has food stamps and cone transportation and denies any other [employer] needs at this time. "My main concern is I have no income".                                HRT/VAS Care Coordination     Patients Home Cardiology Office Heart Failure Clinic   Outpatient Care Team Social Worker   Social Worker Name: Raquel Sarna, [place] [phone]   Living arrangements for the past 2 months Single Family Home   Lives with: Parents   Patient Current Insurance Coverage Medicaid   Patient Has Concern With Paying Medical Bills No   Medical Bill Referrals: financial counseling assisting with disability and medicaid   Does Patient Have Prescription Coverage? Yes   Patient Prescription Assistance Programs Heart Failure Fund; Other   Other Assistance Programs Medications [employer] Devices/Equipment Scales; Cane (specify quad or straight)   DME Agency NA   [place] Agency NA   Current home services DME  cane       Social History:                                                                             SDOH Screenings   Alcohol Screen: Low Risk    Last Alcohol Screening Score (AUDIT): 0  Depression (PHQ2-9): Not on file  Financial  Resource Strain: Medium Risk   Difficulty of Paying Living Expenses: Somewhat hard  Food Insecurity: No Food Insecurity   Worried About Charity fundraiser in the Last Year: Never true   Ran Out of Food in the Last Year: Never true  Housing: Low Risk    Last Housing Risk Score: 0  Physical Activity: Not on file  Social Connections: Not on file  Stress: Not on file  Tobacco Use: Medium Risk   Smoking Tobacco Use: Former   Smokeless Tobacco Use: Never  Passive Exposure: Not on file  Transportation Needs: No Transportation Needs   Lack of Transportation (Medical): No   Lack of Transportation (Non-Medical): No    SDOH Interventions: Financial Resources:    Patient had servant center application pending for disability  Food Insecurity:  Patient has food stamps   Housing Insecurity:  Patient resides with his father   Transportation:    Research officer, trade union    Follow-up plan:  CSW contacted [employer] and left message for [employer] for return call. CSW will contact patient with outcome of conversation with [place] and decide next steps. Raquel Sarna, [place], [place]

## 2020-12-31 NOTE — Patient Instructions (Signed)
STOP Aspirin on 01/06/2021  CONTINUE Plavix 75 mg, one tab daily  Your physician recommends that you schedule a follow-up appointment in: 2-3 months with Dr Aundra Dubin  Do the following things EVERYDAY: Weigh yourself in the morning before breakfast. Write it down and keep it in a log. Take your medicines as prescribed Eat low salt foods--Limit salt (sodium) to 2000 mg per day.  Stay as active as you can everyday Limit all fluids for the day to less than 2 liters  At the [facility], you and your health needs are our priority. As part of our continuing mission to provide you with exceptional heart care, we have created designated Provider Care Teams. These Care Teams include your primary Cardiologist (physician) and Advanced Practice Providers (APPs- Physician Assistants and Nurse Practitioners) who all work together to provide you with the care you need, when you need it.   You may see any of the following providers on your designated Care Team at your next follow up: Dr Glori Bickers Dr Haynes Kerns, NP Lyda Jester, [place] [facility] [place], [place] Audry Riles, PharmD   Please be sure to bring in all your medications bottles to every appointment.   If you have any questions or concerns before your next appointment please send [place] a message through [place] or call our office at [phone].    TO LEAVE A MESSAGE FOR THE NURSE SELECT OPTION 2, PLEASE LEAVE A MESSAGE INCLUDING: YOUR NAME DATE OF BIRTH CALL BACK NUMBER REASON FOR CALL**this is important as we prioritize the call backs  YOU WILL RECEIVE A CALL BACK THE SAME DAY AS LONG AS YOU CALL BEFORE 4:00 PM

## 2021-01-02 ENCOUNTER — Telehealth (HOSPITAL_COMMUNITY): Payer: Self-pay | Admitting: Licensed Clinical Social Worker

## 2021-01-02 NOTE — Telephone Encounter (Signed)
CSW contacted the servant center to follow up on pending disability and obtain an update. Message left. Raquel Sarna, [place], [place]

## 2021-01-06 NOTE — Progress Notes (Signed)
Electrophysiology Office Follow up Visit Note:    Date:  01/07/2021   ID:  MAOR MECKEL, DOB 27-Jan-1986, MRN [id number]  PCP:  Leonie Douglas, MD  [facility] Cardiologist:  Loralie Champagne, MD  [facility] Electrophysiologist:  [name], MD    Interval History:    Wyatt Donovan is a 35 y.o. male who presents for a follow up visit. They were last seen while they were hospitalized in October 2022 with a wide complex tachycardia. He has an extensive CAD history with ischemic cardiomyopathy. While hospitalized his WCT was successfully treated with IV adenosine. Cardiac MRI previously demonstrated an EF 29%. He is ambulating with a cane. He reports improved dyspnea since the hospitalization.       Past Medical History:  Diagnosis Date   Atypical chest pain    CAD (coronary artery disease)    Dyslipidemia    Ex-smoker    History of depression    Insomnia    Ischemic cardiomyopathy    Ejection fraction 40-45%   NSTEMI (non-ST elevated myocardial infarction) ([place]) 08/2007   Treated with a bare metal stent to the proximal LAD   Suicide attempt ([place]) 01/2001    Past Surgical History:  Procedure Laterality Date   ADENOIDECTOMY     CARDIOVERSION N/A 12/06/2020   Procedure: CARDIOVERSION;  Surgeon: Larey Dresser, MD;  Location: [place];  Service: Cardiovascular;  Laterality: N/A;   CORONARY ARTERY BYPASS GRAFT N/A 07/03/2020   Procedure: CORONARY ARTERY BYPASS GRAFTING (CABG) times four using left internal mammary artery, right arm radial artery and right leg saphenous vein;  Surgeon: Lajuana Matte, MD;  Location: [place];  Service: Open Heart Surgery;  Laterality: N/A;   CORONARY STENT INTERVENTION N/A 12/03/2020   Procedure: CORONARY STENT INTERVENTION;  Surgeon: Martinique, Peter M, MD;  Location: [place] CV [facility];  Service: Cardiovascular;  Laterality: N/A;   CORONARY STENT PLACEMENT     Bare metal stent to proximal LAD   LEFT HEART CATH AND CORONARY  ANGIOGRAPHY N/A 07/01/2020   Procedure: LEFT HEART CATH AND CORONARY ANGIOGRAPHY;  Surgeon: Lorretta Harp, MD;  Location: [place] [facility];  Service: Cardiovascular;  Laterality: N/A;   LEFT HEART CATH AND CORS/GRAFTS ANGIOGRAPHY N/A 12/03/2020   Procedure: LEFT HEART CATH AND CORS/GRAFTS ANGIOGRAPHY;  Surgeon: Martinique, Peter M, MD;  Location: [place] [facility];  Service: Cardiovascular;  Laterality: N/A;   RADIAL ARTERY HARVEST Right 07/03/2020   Procedure: RADIAL ARTERY HARVEST;  Surgeon: Lajuana Matte, MD;  Location: [place];  Service: Open Heart Surgery;  Laterality: Right;   TEE WITHOUT CARDIOVERSION N/A 07/03/2020   Procedure: TRANSESOPHAGEAL ECHOCARDIOGRAM (TEE);  Surgeon: Lajuana Matte, MD;  Location: [place];  Service: Open Heart Surgery;  Laterality: N/A;    Current Medications: Current Meds  Medication Sig   amiodarone (PACERONE) 200 MG tablet Take 1 tablet (200 mg total) by mouth daily.   apixaban (ELIQUIS) 5 MG TABS tablet Take 1 tablet (5 mg total) by mouth 2 (two) times daily.   atorvastatin (LIPITOR) 80 MG tablet TAKE 1 Tablet BY MOUTH ONCE DAILY   carvedilol (COREG) 3.125 MG tablet Take 1 tablet (3.125 mg total) by mouth 2 (two) times daily with a meal.   clopidogrel (PLAVIX) 75 MG tablet Take 1 tablet (75 mg total) by mouth daily.   dapagliflozin propanediol (FARXIGA) 10 MG TABS tablet Take 1 tablet (10 mg total) by mouth daily before breakfast.   nitroGLYCERIN (NITROSTAT) 0.4  MG SL tablet Place 0.4 mg under the tongue every 5 (five) minutes as needed for chest pain.     Allergies:   Codeine   Social History   Socioeconomic History   Marital status: Single    Spouse name: Not on file   Number of children: Not on file   Years of education: Not on file   Highest education level: High school graduate  Occupational History   Occupation: Not actively working    Comment: applied for disability x 2.   Tobacco Use   Smoking status: Former    Packs/day: 1.00     Years: 22.00    Pack years: 22.00    Types: Cigarettes, Cigars    Quit date: 06/28/2020    Years since quitting: 0.5   Smokeless tobacco: Never   Tobacco comments:    cigarettes stopped in 2020, swisher sweets started in 2018-04-05/day  Vaping Use   Vaping Use: Never used  Substance and Sexual Activity   Alcohol use: Never   Drug use: Not Currently    Types: Marijuana   Sexual activity: Never  Other Topics Concern   Not on file  Social History Narrative   Single   Lives with his parents   Trying to get his GED   DeGent date all cultures   Social Determinants of Health   Financial Resource Strain: Medium Risk   Difficulty of Paying Living Expenses: Somewhat hard  Food Insecurity: No Food Insecurity   Worried About Charity fundraiser in the Last Year: Never true   Ran Out of Food in the Last Year: Never true  Transportation Needs: No Transportation Needs   Lack of Transportation (Medical): No   Lack of Transportation (Non-Medical): No  Physical Activity: Not on file  Stress: Not on file  Social Connections: Not on file     Family History: The patient's family history is negative for Hypertension, Diabetes, and Coronary artery disease.  ROS:   Please see the history of present illness.    All other systems reviewed and are negative.  EKGs/Labs/Other Studies Reviewed:    The following studies were reviewed today:  12/05/2020 Cardiac MRI IMPRESSION: 1. Subendocardial late gadolinium enhancement consistent with prior infarct in basal to mid inferior/inferoseptal/inferolateral/anterolateral walls, apical lateral/septal walls, and apex. LGE is greater than 50% transmural suggesting nonviability in basal inferior/inferoseptal/inferolateral/anterolateral walls, mid inferior/inferolateral/anterolateral walls, apical lateral wall, and distal apex. 2.  Normal LV size with severe systolic dysfunction (EF 99%) 3.  Small RV size with mild systolic dysfunction (EF 83%) 4.  No  LV thrombus seen         EKG:  The ekg ordered today demonstrates sinus rhythm with inferior/anterolateral infarction.  Recent Labs: 06/29/2020: TSH 0.978 08/08/2020: ALT 41 12/03/2020: B Natriuretic Peptide 122.1 12/06/2020: Magnesium 2.2 12/11/2020: Hemoglobin 15.1; Platelets 271 12/20/2020: BUN 11; Creatinine, Ser 0.95; Potassium 4.5; Sodium 140  Recent Lipid Panel    Component Value Date/Time   CHOL 94 12/04/2020 0137   TRIG 118 12/04/2020 0137   HDL 25 (L) 12/04/2020 0137   CHOLHDL 3.8 12/04/2020 0137   VLDL 24 12/04/2020 0137   LDLCALC 45 12/04/2020 0137    Physical Exam:    VS:  BP 90/60   Pulse 69   Ht 6\' 1"  (1.854 m)   Wt 181 lb 6.4 oz (82.3 kg)   SpO2 98%   BMI 23.93 kg/m     Wt Readings from Last 3 Encounters:  01/07/21 181 lb 6.4 oz (  82.3 kg)  12/31/20 182 lb 9.6 oz (82.8 kg)  12/11/20 178 lb 12.8 oz (81.1 kg)     GEN: chronically ill appearing, using cane HEENT: Normal NECK: No JVD; No carotid bruits LYMPHATICS: No lymphadenopathy CARDIAC: RRR, no murmurs, rubs, gallops RESPIRATORY:  Clear to auscultation without rales, wheezing or rhonchi  ABDOMEN: Soft, non-tender, non-distended MUSCULOSKELETAL:  No edema; No deformity  SKIN: Warm and dry NEUROLOGIC:  Alert and oriented x 3 PSYCHIATRIC:  Normal affect        ASSESSMENT:    1. Chronic systolic CHF (congestive heart failure) (HCC)   2. Paroxysmal atrial flutter ([place])   3. Coronary artery disease involving native coronary artery of native heart with angina pectoris ([place])   4. Ischemic cardiomyopathy    PLAN:    In order of problems listed above:  #Chronic systolic HF NYHA III. KV<42%. Hx of severe CAD s/p CABG. On good medical therapy. We discussed his risk of SCD during today's appointment. I think he is at a very elevated risk of SCD and would likely benefit from ICD implant. I discussed the procedure in detail including the risks/benefits of ICD implant and he wishes to  proceed.  The patient has an  ischemic CM (EF 29%), NYHA Class III CHF, and CAD.  He is referred by Dr Aundra Dubin for risk stratification of sudden death and consideration of ICD implantation.  At this time, he meets criteria for ICD implantation for primary prevention of sudden death.  I have had a thorough discussion with the patient reviewing options.  The patient and their family (if available) have had opportunities to ask questions and have them answered. The patient and I have decided together through a shared decision making process to proceed with ICD implant at this time.    Risks, benefits, alternatives to ICD implantation were discussed in detail with the patient today. The patient understands that the risks include but are not limited to bleeding, infection, pneumothorax, perforation, tamponade, vascular damage, renal failure, MI, stroke, death, inappropriate shocks, and lead dislodgement and wishes to proceed.  We will therefore schedule device implantation at the next available time.  Plan for [employer] DDD single coil ICD.        Total time spent with patient today 45 minutes. This includes reviewing records, evaluating the patient and coordinating care.   Medication Adjustments/Labs and Tests Ordered: Current medicines are reviewed at length with the patient today.  Concerns regarding medicines are outlined above.  Orders Placed This Encounter  Procedures   CBC w/Diff   Basic Metabolic Panel (BMET)   EKG 12-Lead   No orders of the defined types were placed in this encounter.    Signed, Lars Mage, MD, [facility], [facility] 01/07/2021 5:23 PM    Electrophysiology [facility]

## 2021-01-07 ENCOUNTER — Other Ambulatory Visit: Payer: Self-pay

## 2021-01-07 ENCOUNTER — Encounter: Payer: Self-pay | Admitting: Cardiology

## 2021-01-07 ENCOUNTER — Ambulatory Visit (INDEPENDENT_AMBULATORY_CARE_PROVIDER_SITE_OTHER): Payer: Medicaid Other | Admitting: Cardiology

## 2021-01-07 VITALS — BP 90/60 | HR 69 | Ht 73.0 in | Wt 181.4 lb

## 2021-01-07 DIAGNOSIS — I255 Ischemic cardiomyopathy: Secondary | ICD-10-CM

## 2021-01-07 DIAGNOSIS — I25119 Atherosclerotic heart disease of native coronary artery with unspecified angina pectoris: Secondary | ICD-10-CM | POA: Diagnosis not present

## 2021-01-07 DIAGNOSIS — I4892 Unspecified atrial flutter: Secondary | ICD-10-CM | POA: Diagnosis not present

## 2021-01-07 DIAGNOSIS — I5022 Chronic systolic (congestive) heart failure: Secondary | ICD-10-CM

## 2021-01-07 NOTE — Patient Instructions (Signed)
Medication Instructions:  Your physician recommends that you continue on your current medications as directed. Please refer to the Current Medication list given to you today. *If you need a refill on your cardiac medications before your next appointment, please call your pharmacy*  Lab Work: None ordered. If you have labs (blood work) drawn today and your tests are completely normal, you will receive your results only by: [place] (if you have MyChart) OR A paper copy in the mail If you have any lab test that is abnormal or we need to change your treatment, we will call you to review the results.  Testing/Procedures: None ordered.  Follow-Up: At [facility], you and your health needs are our priority.  As part of our continuing mission to provide you with exceptional heart care, we have created designated Provider Care Teams.  These Care Teams include your primary Cardiologist (physician) and Advanced Practice Providers (APPs -  Physician Assistants and Nurse Practitioners) who all work together to provide you with the care you need, when you need it.  Your next appointment:    SEE INSTRUCTION LETTER

## 2021-01-10 ENCOUNTER — Telehealth (HOSPITAL_COMMUNITY): Payer: Self-pay | Admitting: Licensed Clinical Social Worker

## 2021-01-10 NOTE — Telephone Encounter (Signed)
CSW has not heard back yet from the [facility] regarding pending disability. CSW sent email requesting update on patient's application. CSW awaiting return call. Raquel Sarna, [place], [place]

## 2021-01-13 ENCOUNTER — Other Ambulatory Visit (HOSPITAL_COMMUNITY): Payer: Self-pay

## 2021-01-13 MED ORDER — APIXABAN 5 MG PO TABS: 5.0000 mg | ORAL_TABLET | Freq: Two times a day (BID) | ORAL | 6 refills | Status: DC

## 2021-01-13 MED ORDER — AMIODARONE HCL 200 MG PO TABS: 200.0000 mg | ORAL_TABLET | Freq: Every day | ORAL | 6 refills | Status: DC

## 2021-01-13 NOTE — Telephone Encounter (Signed)
Meds ordered this encounter  Medications   amiodarone (PACERONE) 200 MG tablet    Sig: Take 1 tablet (200 mg total) by mouth daily.    Dispense:  30 tablet    Refill:  6   apixaban (ELIQUIS) 5 MG TABS tablet    Sig: Take 1 tablet (5 mg total) by mouth 2 (two) times daily.    Dispense:  60 tablet    Refill:  6

## 2021-02-10 ENCOUNTER — Ambulatory Visit (HOSPITAL_COMMUNITY)
Admission: RE | Admit: 2021-02-10 | Discharge: 2021-02-11 | Disposition: A | Discharge status: Home / Self Care | Payer: Medicaid Other | Attending: Cardiology | Admitting: Cardiology

## 2021-02-10 ENCOUNTER — Encounter (HOSPITAL_COMMUNITY)
Admission: RE | Disposition: A | Discharge status: Home / Self Care | Payer: Self-pay | Source: Home / Self Care | Attending: Cardiology

## 2021-02-10 ENCOUNTER — Other Ambulatory Visit: Payer: Self-pay

## 2021-02-10 DIAGNOSIS — I4892 Unspecified atrial flutter: Secondary | ICD-10-CM | POA: Insufficient documentation

## 2021-02-10 DIAGNOSIS — I2510 Atherosclerotic heart disease of native coronary artery without angina pectoris: Secondary | ICD-10-CM | POA: Insufficient documentation

## 2021-02-10 DIAGNOSIS — I5022 Chronic systolic (congestive) heart failure: Secondary | ICD-10-CM | POA: Insufficient documentation

## 2021-02-10 DIAGNOSIS — I252 Old myocardial infarction: Secondary | ICD-10-CM | POA: Diagnosis not present

## 2021-02-10 DIAGNOSIS — Z955 Presence of coronary angioplasty implant and graft: Secondary | ICD-10-CM | POA: Diagnosis not present

## 2021-02-10 DIAGNOSIS — I255 Ischemic cardiomyopathy: Secondary | ICD-10-CM

## 2021-02-10 HISTORY — PX: ICD IMPLANT: EP1208

## 2021-02-10 SURGERY — ICD IMPLANT

## 2021-02-10 MED ORDER — MIDAZOLAM HCL 5 MG/5ML IJ SOLN
INTRAMUSCULAR | Status: DC | PRN
  Administered 2021-02-10 (×2): .5 mg via INTRAVENOUS

## 2021-02-10 MED ORDER — LIDOCAINE HCL (PF) 1 % IJ SOLN
INTRAMUSCULAR | Status: AC
  Filled 2021-02-10: qty 60

## 2021-02-10 MED ORDER — DAPAGLIFLOZIN PROPANEDIOL 10 MG PO TABS
10.0000 mg | ORAL_TABLET | Freq: Every day | ORAL | Status: DC
  Administered 2021-02-11: 10 mg via ORAL
  Filled 2021-02-10: qty 1

## 2021-02-10 MED ORDER — SODIUM CHLORIDE 0.9 % IV SOLN
INTRAVENOUS | Status: AC
  Filled 2021-02-10: qty 2

## 2021-02-10 MED ORDER — CARVEDILOL 3.125 MG PO TABS
3.1250 mg | ORAL_TABLET | Freq: Two times a day (BID) | ORAL | Status: DC
  Administered 2021-02-11: 3.125 mg via ORAL
  Filled 2021-02-10: qty 1

## 2021-02-10 MED ORDER — SODIUM CHLORIDE 0.9 % IV SOLN
80.0000 mg | INTRAVENOUS | Status: AC
  Administered 2021-02-10: 80 mg

## 2021-02-10 MED ORDER — SODIUM CHLORIDE 0.9 % IV SOLN: INTRAVENOUS | Status: DC

## 2021-02-10 MED ORDER — LIDOCAINE HCL (PF) 1 % IJ SOLN
INTRAMUSCULAR | Status: DC | PRN
  Administered 2021-02-10: 60 mL

## 2021-02-10 MED ORDER — ACETAMINOPHEN 325 MG PO TABS
325.0000 mg | ORAL_TABLET | ORAL | Status: DC | PRN
  Administered 2021-02-10 – 2021-02-11 (×2): 650 mg via ORAL
  Filled 2021-02-10: qty 2

## 2021-02-10 MED ORDER — CEFAZOLIN SODIUM-DEXTROSE 2-4 GM/100ML-% IV SOLN
2.0000 g | INTRAVENOUS | Status: AC
  Administered 2021-02-10: 2 g via INTRAVENOUS

## 2021-02-10 MED ORDER — FENTANYL CITRATE (PF) 100 MCG/2ML IJ SOLN
INTRAMUSCULAR | Status: DC | PRN
  Administered 2021-02-10 (×2): 12.5 ug via INTRAVENOUS

## 2021-02-10 MED ORDER — MIDAZOLAM HCL 5 MG/5ML IJ SOLN
INTRAMUSCULAR | Status: AC
  Filled 2021-02-10: qty 5

## 2021-02-10 MED ORDER — HEPARIN (PORCINE) IN NACL 1000-0.9 UT/500ML-% IV SOLN
INTRAVENOUS | Status: DC | PRN
  Administered 2021-02-10: 500 mL

## 2021-02-10 MED ORDER — AMIODARONE HCL 200 MG PO TABS
200.0000 mg | ORAL_TABLET | Freq: Every day | ORAL | Status: DC
  Administered 2021-02-11: 200 mg via ORAL
  Filled 2021-02-10: qty 1

## 2021-02-10 MED ORDER — POVIDONE-IODINE 10 % EX SWAB
2.0000 "application " | Freq: Once | CUTANEOUS | Status: AC
  Administered 2021-02-10: 2 via TOPICAL

## 2021-02-10 MED ORDER — HEPARIN (PORCINE) IN NACL 1000-0.9 UT/500ML-% IV SOLN
INTRAVENOUS | Status: AC
  Filled 2021-02-10: qty 500

## 2021-02-10 MED ORDER — CHLORHEXIDINE GLUCONATE 4 % EX LIQD: 4.0000 "application " | Freq: Once | CUTANEOUS | Status: DC

## 2021-02-10 MED ORDER — CLOPIDOGREL BISULFATE 75 MG PO TABS
75.0000 mg | ORAL_TABLET | Freq: Every day | ORAL | Status: DC
  Administered 2021-02-11: 75 mg via ORAL
  Filled 2021-02-10: qty 1

## 2021-02-10 MED ORDER — FENTANYL CITRATE (PF) 100 MCG/2ML IJ SOLN
INTRAMUSCULAR | Status: AC
  Filled 2021-02-10: qty 2

## 2021-02-10 MED ORDER — CEFAZOLIN SODIUM-DEXTROSE 2-4 GM/100ML-% IV SOLN
INTRAVENOUS | Status: AC
  Filled 2021-02-10: qty 100

## 2021-02-10 MED ORDER — ONDANSETRON HCL 4 MG/2ML IJ SOLN: 4.0000 mg | Freq: Four times a day (QID) | INTRAMUSCULAR | Status: DC | PRN

## 2021-02-10 MED ORDER — ATORVASTATIN CALCIUM 80 MG PO TABS
80.0000 mg | ORAL_TABLET | Freq: Every day | ORAL | Status: DC
  Administered 2021-02-11: 80 mg via ORAL
  Filled 2021-02-10: qty 1

## 2021-02-10 SURGICAL SUPPLY — 10 items
CABLE SURGICAL S-101-97-12 (CABLE) ×2 IMPLANT
ICD VIGILANT DR D233 (Pacemaker) ×1 IMPLANT
LEAD INGEVITY 7841 52 (Lead) ×1 IMPLANT
LEAD RELIANCE 0673 ×1 IMPLANT
MAT PREVALON FULL STRYKER (MISCELLANEOUS) ×1 IMPLANT
PAD DEFIB RADIO PHYSIO CONN (PAD) ×2 IMPLANT
SHEATH 7FR PRELUDE SNAP 13 (SHEATH) ×1 IMPLANT
SHEATH 8FR PRELUDE SNAP 13 (SHEATH) ×1 IMPLANT
SHEATH PROBE COVER 6X72 (BAG) ×1 IMPLANT
TRAY PACEMAKER INSERTION (PACKS) ×2 IMPLANT

## 2021-02-10 NOTE — H&P (Signed)
Electrophysiology Office Follow up Visit Note:     Date:  01/07/2021    ID:  Wyatt Donovan, DOB [birth year], MRN [id number]   PCP:  Leonie Douglas, MD            [facility] Cardiologist:  Loralie Champagne, MD  [facility] Electrophysiologist:  [name], MD      Interval History:     Wyatt Donovan is a 35 y.o. male who presents for a follow up visit. They were last seen while they were hospitalized in October 2022 with a wide complex tachycardia. He has an extensive CAD history with ischemic cardiomyopathy. While hospitalized his WCT was successfully treated with IV adenosine. Cardiac MRI previously demonstrated an EF 29%. He is ambulating with a cane. He reports improved dyspnea since the hospitalization.         Objective          Past Medical History:  Diagnosis Date   Atypical chest pain     CAD (coronary artery disease)     Dyslipidemia     Ex-smoker     History of depression     Insomnia     Ischemic cardiomyopathy      Ejection fraction 40-45%   NSTEMI (non-ST elevated myocardial infarction) ([place]) 08/2007    Treated with a bare metal stent to the proximal LAD   Suicide attempt ([place]) 01/2001           Past Surgical History:  Procedure Laterality Date   ADENOIDECTOMY       CARDIOVERSION N/A 12/06/2020    Procedure: CARDIOVERSION;  Surgeon: Larey Dresser, MD;  Location: [place];  Service: Cardiovascular;  Laterality: N/A;   CORONARY ARTERY BYPASS GRAFT N/A 07/03/2020    Procedure: CORONARY ARTERY BYPASS GRAFTING (CABG) times four using left internal mammary artery, right arm radial artery and right leg saphenous vein;  Surgeon: Lajuana Matte, MD;  Location: [place];  Service: Open Heart Surgery;  Laterality: N/A;   CORONARY STENT INTERVENTION N/A 12/03/2020    Procedure: CORONARY STENT INTERVENTION;  Surgeon: Martinique, Peter M, MD;  Location: [place] [facility];  Service: Cardiovascular;  Laterality: N/A;   CORONARY STENT PLACEMENT         Bare metal stent to proximal LAD   LEFT HEART CATH AND CORONARY ANGIOGRAPHY N/A 07/01/2020    Procedure: LEFT HEART CATH AND CORONARY ANGIOGRAPHY;  Surgeon: Lorretta Harp, MD;  Location: [place] [facility];  Service: Cardiovascular;  Laterality: N/A;   LEFT HEART CATH AND CORS/GRAFTS ANGIOGRAPHY N/A 12/03/2020    Procedure: LEFT HEART CATH AND CORS/GRAFTS ANGIOGRAPHY;  Surgeon: Martinique, Peter M, MD;  Location: [place] [facility];  Service: Cardiovascular;  Laterality: N/A;   RADIAL ARTERY HARVEST Right 07/03/2020    Procedure: RADIAL ARTERY HARVEST;  Surgeon: Lajuana Matte, MD;  Location: [place];  Service: Open Heart Surgery;  Laterality: Right;   TEE WITHOUT CARDIOVERSION N/A 07/03/2020    Procedure: TRANSESOPHAGEAL ECHOCARDIOGRAM (TEE);  Surgeon: Lajuana Matte, MD;  Location: [place];  Service: Open Heart Surgery;  Laterality: N/A;      Current Medications: Active Medications      Current Meds  Medication Sig   amiodarone (PACERONE) 200 MG tablet Take 1 tablet (200 mg total) by mouth daily.   apixaban (ELIQUIS) 5 MG TABS tablet Take 1 tablet (5 mg total) by mouth 2 (two) times daily.   atorvastatin (LIPITOR) 80 MG tablet TAKE 1 Tablet BY MOUTH  ONCE DAILY   carvedilol (COREG) 3.125 MG tablet Take 1 tablet (3.125 mg total) by mouth 2 (two) times daily with a meal.   clopidogrel (PLAVIX) 75 MG tablet Take 1 tablet (75 mg total) by mouth daily.   dapagliflozin propanediol (FARXIGA) 10 MG TABS tablet Take 1 tablet (10 mg total) by mouth daily before breakfast.   nitroGLYCERIN (NITROSTAT) 0.4 MG SL tablet Place 0.4 mg under the tongue every 5 (five) minutes as needed for chest pain.        Allergies:   Codeine    Social History         Socioeconomic History   Marital status: Single      Spouse name: Not on file   Number of children: Not on file   Years of education: Not on file   Highest education level: High school graduate  Occupational History   Occupation: Not actively  working      Comment: applied for disability x 2.   Tobacco Use   Smoking status: Former      Packs/day: 1.00      Years: 22.00      Pack years: 22.00      Types: Cigarettes, Cigars      Quit date: 06/28/2020      Years since quitting: 0.5   Smokeless tobacco: Never   Tobacco comments:      cigarettes stopped in 2020, swisher sweets started in 2018-04-05/day  Vaping Use   Vaping Use: Never used  Substance and Sexual Activity   Alcohol use: Never   Drug use: Not Currently      Types: Marijuana   Sexual activity: Never  Other Topics Concern   Not on file  Social History Narrative    Single    Lives with his parents    Trying to get his GED    DeGent date all cultures    Social Determinants of Health       Financial Resource Strain: Medium Risk   Difficulty of Paying Living Expenses: Somewhat hard  Food Insecurity: No Food Insecurity   Worried About Charity fundraiser in the Last Year: Never true   Ran Out of Food in the Last Year: Never true  Transportation Needs: No Transportation Needs   Lack of Transportation (Medical): No   Lack of Transportation (Non-Medical): No  Physical Activity: Not on file  Stress: Not on file  Social Connections: Not on file      Family History: The patient's family history is negative for Hypertension, Diabetes, and Coronary artery disease.   ROS:   Please see the history of present illness.    All other systems reviewed and are negative.   EKGs/Labs/Other Studies Reviewed:     The following studies were reviewed today:   12/05/2020 Cardiac MRI IMPRESSION: 1. Subendocardial late gadolinium enhancement consistent with prior infarct in basal to mid inferior/inferoseptal/inferolateral/anterolateral walls, apical lateral/septal walls, and apex. LGE is greater than 50% transmural suggesting nonviability in basal inferior/inferoseptal/inferolateral/anterolateral walls, mid inferior/inferolateral/anterolateral walls, apical lateral wall,  and distal apex. 2.  Normal LV size with severe systolic dysfunction (EF 42%) 3.  Small RV size with mild systolic dysfunction (EF 35%) 4.  No LV thrombus seen                 EKG:  The ekg ordered today demonstrates sinus rhythm with inferior/anterolateral infarction.   Recent Labs: 06/29/2020: TSH 0.978 08/08/2020: ALT 41 12/03/2020: B Natriuretic Peptide 122.1 12/06/2020: Magnesium 2.2 12/11/2020:  Hemoglobin 15.1; Platelets 271 12/20/2020: BUN 11; Creatinine, Ser 0.95; Potassium 4.5; Sodium 140  Recent Lipid Panel Labs (Brief)          Component Value Date/Time    CHOL 94 12/04/2020 0137    TRIG 118 12/04/2020 0137    HDL 25 (L) 12/04/2020 0137    CHOLHDL 3.8 12/04/2020 0137    VLDL 24 12/04/2020 0137    LDLCALC 45 12/04/2020 0137        Physical Exam:     VS:  BP 90/60   Pulse 69   Ht 6\' 1"  (1.854 m)   Wt 181 lb 6.4 oz (82.3 kg)   SpO2 98%   BMI 23.93 kg/m         Wt Readings from Last 3 Encounters:  01/07/21 181 lb 6.4 oz (82.3 kg)  12/31/20 182 lb 9.6 oz (82.8 kg)  12/11/20 178 lb 12.8 oz (81.1 kg)      GEN: chronically ill appearing, using cane HEENT: Normal NECK: No JVD; No carotid bruits LYMPHATICS: No lymphadenopathy CARDIAC: RRR, no murmurs, rubs, gallops RESPIRATORY:  Clear to auscultation without rales, wheezing or rhonchi  ABDOMEN: Soft, non-tender, non-distended MUSCULOSKELETAL:  No edema; No deformity  SKIN: Warm and dry NEUROLOGIC:  Alert and oriented x 3 PSYCHIATRIC:  Normal affect            Assessment     ASSESSMENT:     1. Chronic systolic CHF (congestive heart failure) (HCC)   2. Paroxysmal atrial flutter ([place])   3. Coronary artery disease involving native coronary artery of native heart with angina pectoris ([place])   4. Ischemic cardiomyopathy     PLAN:     In order of problems listed above:   #Chronic systolic HF NYHA III. KD<32%. Hx of severe CAD s/p CABG. On good medical therapy. We discussed his risk of SCD  during today's appointment. I think he is at a very elevated risk of SCD and would likely benefit from ICD implant. I discussed the procedure in detail including the risks/benefits of ICD implant and he wishes to proceed.   The patient has an  ischemic CM (EF 29%), NYHA Class III CHF, and CAD.  He is referred by Dr Aundra Dubin for risk stratification of sudden death and consideration of ICD implantation.  At this time, he meets criteria for ICD implantation for primary prevention of sudden death.  I have had a thorough discussion with the patient reviewing options.  The patient and their family (if available) have had opportunities to ask questions and have them answered. The patient and I have decided together through a shared decision making process to proceed with ICD implant at this time.     Risks, benefits, alternatives to ICD implantation were discussed in detail with the patient today. The patient understands that the risks include but are not limited to bleeding, infection, pneumothorax, perforation, tamponade, vascular damage, renal failure, MI, stroke, death, inappropriate shocks, and lead dislodgement and wishes to proceed.  We will therefore schedule device implantation at the next available time.   Plan for [employer] DDD single coil ICD.               Total time spent with patient today 45 minutes. This includes reviewing records, evaluating the patient and coordinating care.    Medication Adjustments/Labs and Tests Ordered: Current medicines are reviewed at length with the patient today.  Concerns regarding medicines are outlined above.     Orders Placed This Encounter  Procedures   CBC w/Diff   Basic Metabolic Panel (BMET)   EKG 12-Lead    No orders of the defined types were placed in this encounter.       Signed, Lars Mage, MD, [facility], [facility] 01/07/2021 5:23 PM    Electrophysiology [place]     ----------------------------  I have  seen, examined the patient, and reviewed the above assessment and plan.    Plan for VVI ICD today.   [name], MD 02/10/2021 2:27 PM

## 2021-02-11 ENCOUNTER — Ambulatory Visit (HOSPITAL_COMMUNITY): Payer: Medicaid Other

## 2021-02-11 ENCOUNTER — Encounter (HOSPITAL_COMMUNITY): Payer: Self-pay | Admitting: Cardiology

## 2021-02-11 DIAGNOSIS — Z955 Presence of coronary angioplasty implant and graft: Secondary | ICD-10-CM | POA: Diagnosis not present

## 2021-02-11 DIAGNOSIS — I2510 Atherosclerotic heart disease of native coronary artery without angina pectoris: Secondary | ICD-10-CM | POA: Diagnosis not present

## 2021-02-11 DIAGNOSIS — I255 Ischemic cardiomyopathy: Secondary | ICD-10-CM

## 2021-02-11 DIAGNOSIS — I252 Old myocardial infarction: Secondary | ICD-10-CM | POA: Diagnosis not present

## 2021-02-11 IMAGING — DX DG CHEST 2V
2 series · 2 of 2 positions shown · non-contrast
Comparison: Previous studies including the examination of
[DATE]

CLINICAL DATA: Pacemaker placement

EXAM:
CHEST - 2 VIEW

[x chest ap]
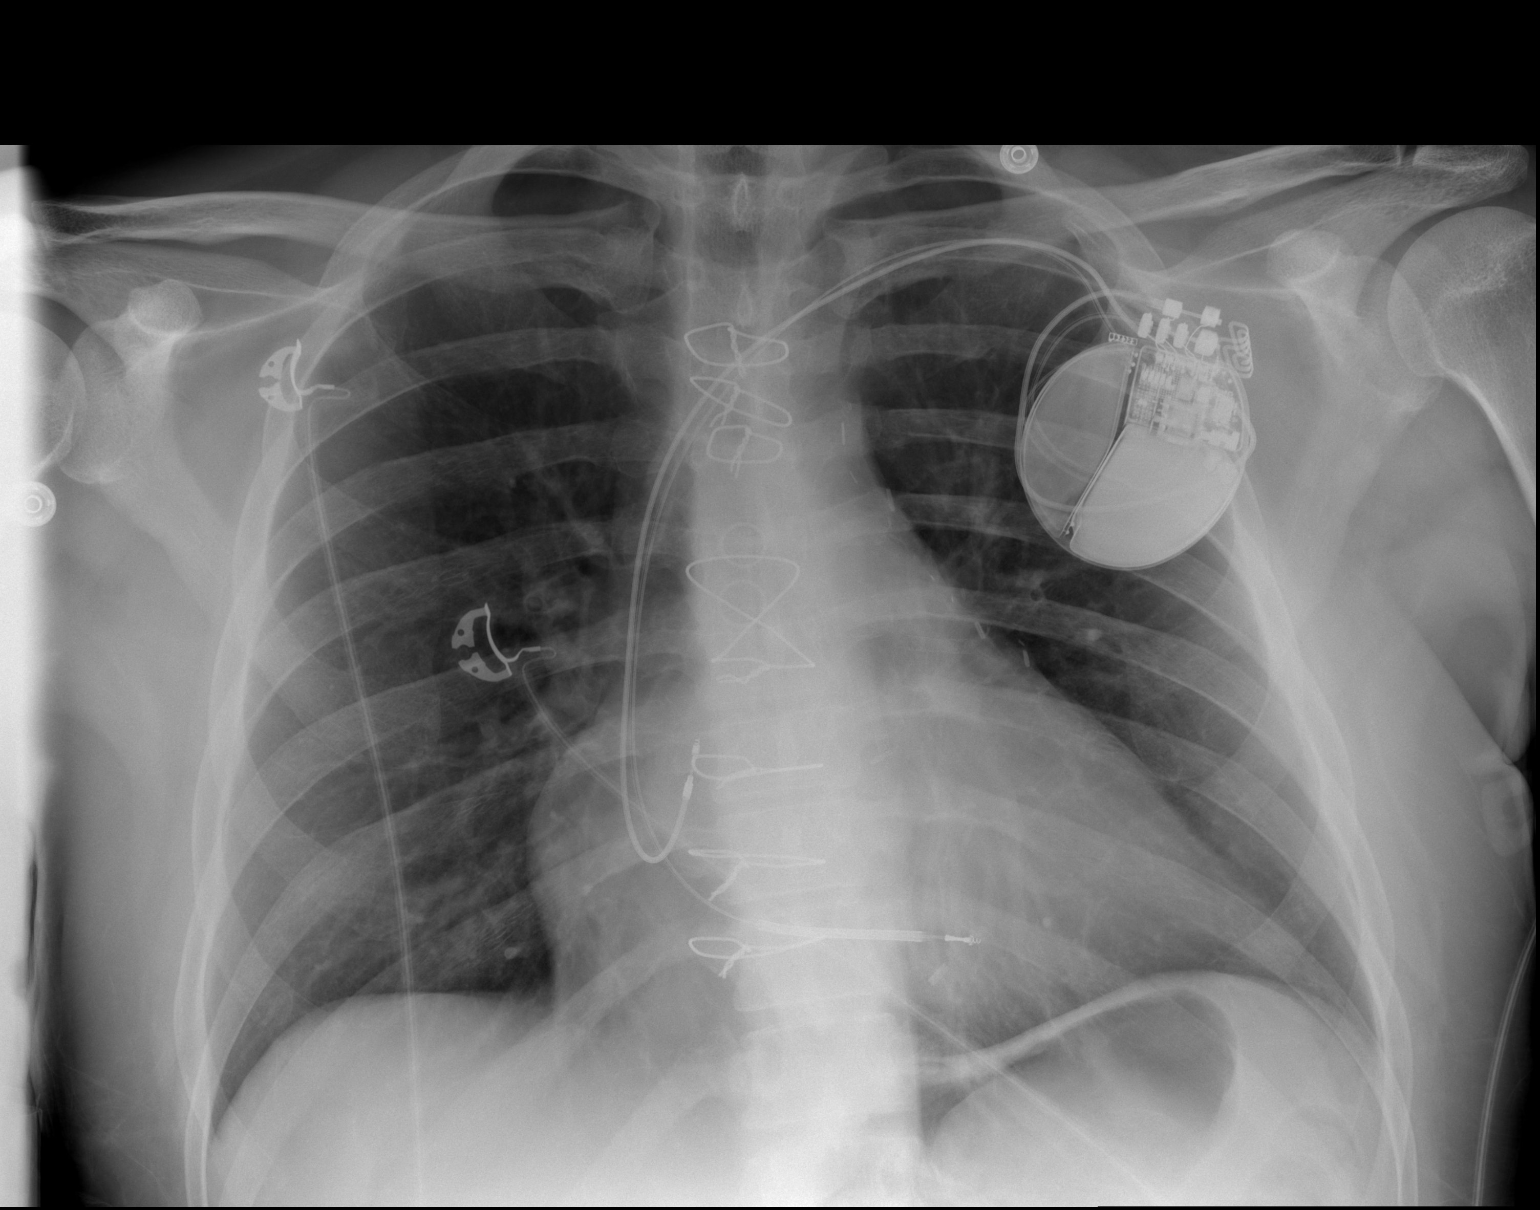

[w chest lat]
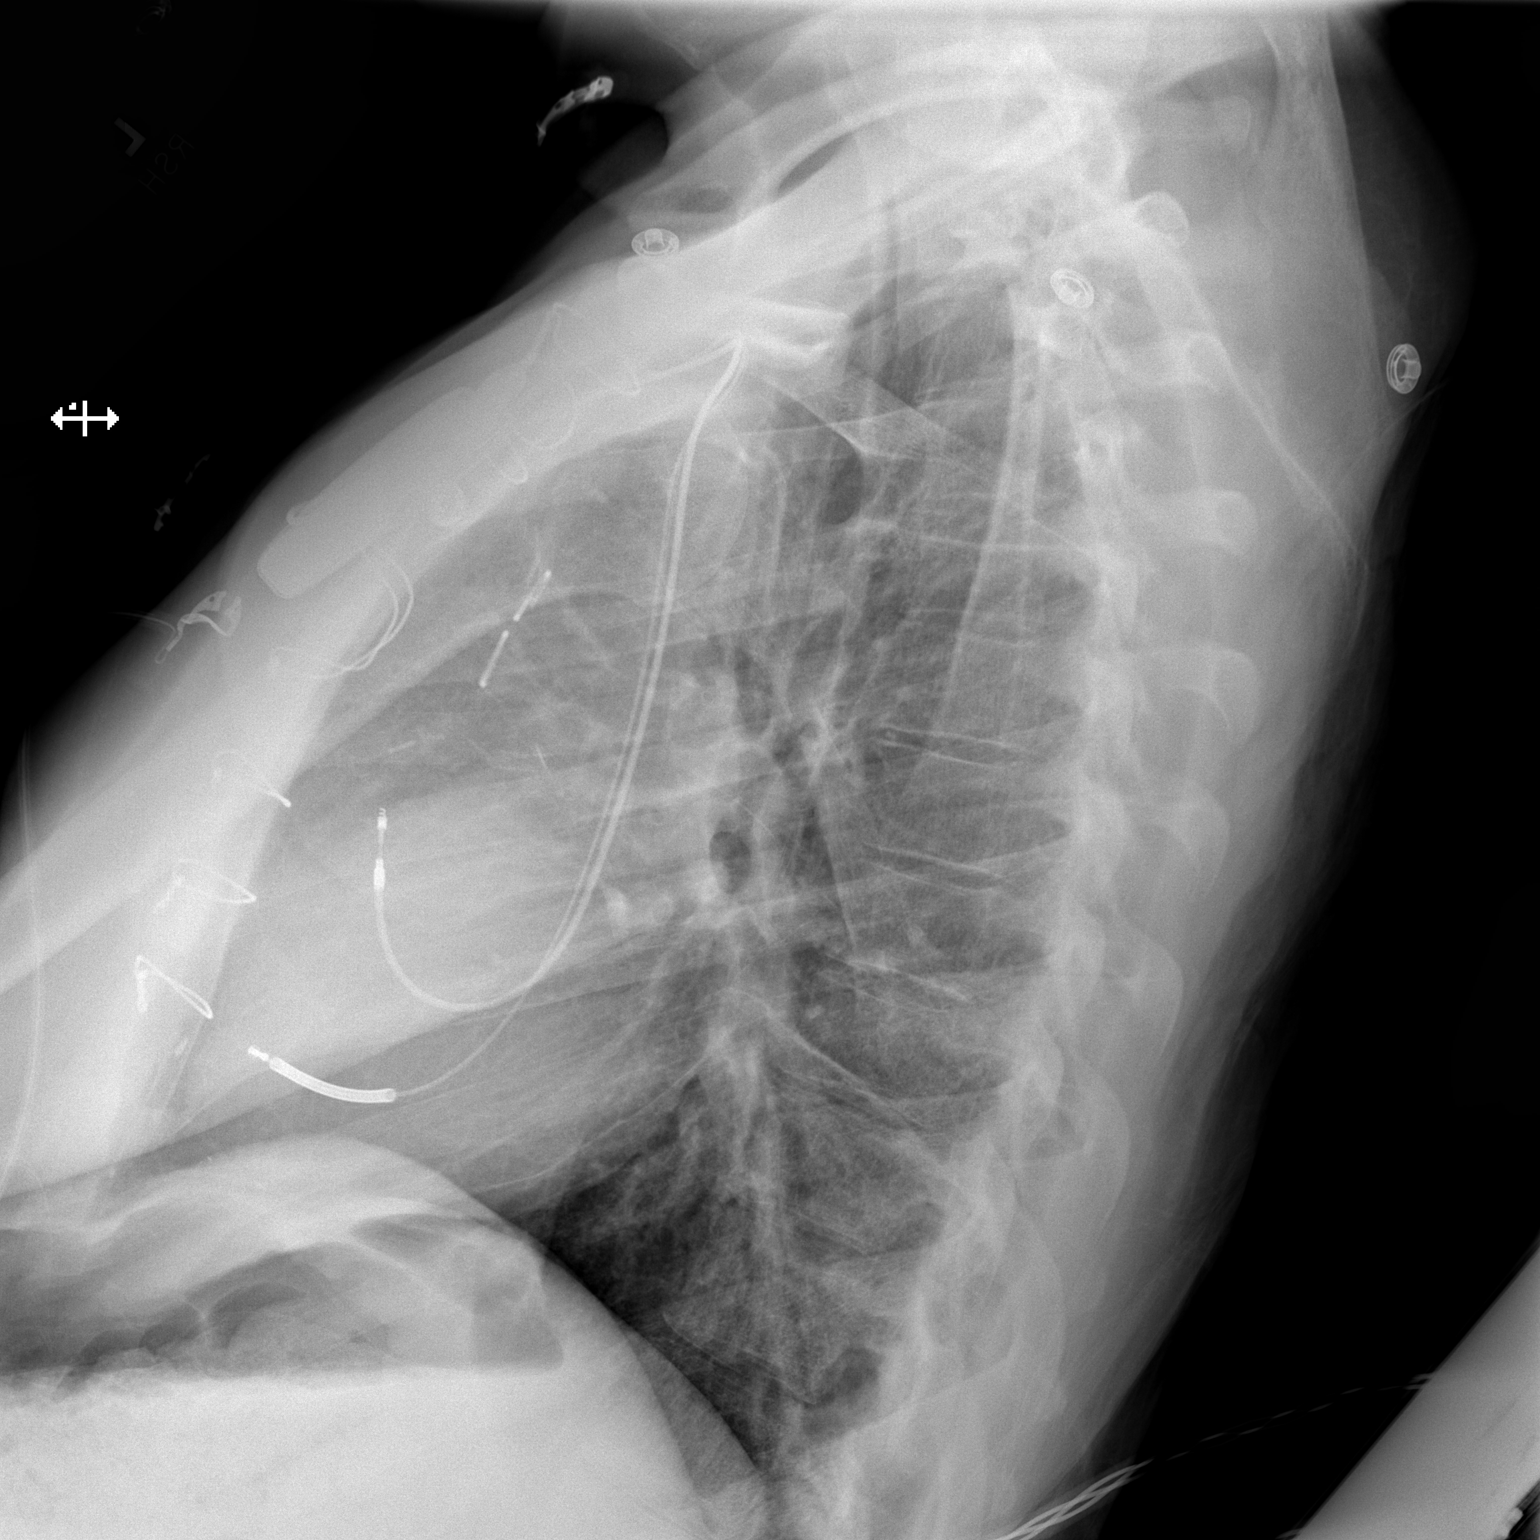

[2 of 2 positions shown; findings below may reference images not displayed]

FINDINGS: Transverse diameter of heart is increased. There are no signs of
pulmonary edema. There is no focal pulmonary consolidation. There is
no pleural effusion or pneumothorax. There is interval placement of
defibrillator cardiac pacer in the left infraclavicular region. Tips
of leads seen in the right atrium and right ventricle. There is
evidence of previous coronary bypass surgery.
IMPRESSION: Cardiomegaly. There are no signs of pulmonary edema or new focal
infiltrates. There is interval placement of pacemaker battery in the
left infraclavicular region.

## 2021-02-11 MED ORDER — ACETAMINOPHEN 325 MG PO TABS: 650.0000 mg | ORAL_TABLET | Freq: Three times a day (TID) | ORAL | 0 refills | Status: AC

## 2021-02-11 MED ORDER — APIXABAN 5 MG PO TABS: 5.0000 mg | ORAL_TABLET | Freq: Two times a day (BID) | ORAL | 6 refills | Status: DC

## 2021-02-11 NOTE — Discharge Instructions (Signed)
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 After 7 days, you may progress as below.  You should remove your sling 24 hours after your procedure, unless otherwise instructed by your provider.     Tuesday February 18, 2021  Wednesday February 19, 2021 Thursday February 20, 2021 Friday February 21, 2021   Do not lift, push, pull, or carry anything over 10 pounds with the affected arm until 6 weeks (Tuesday March 25, 2021 ) after your procedure.   You may drive AFTER your wound check, unless you have been told otherwise by your provider.   Ask your healthcare provider when you can go back to work   INCISION/Dressing If you are on a blood thinner such as Coumadin, Xarelto, Eliquis, Plavix, or Pradaxa please confirm with your provider when this should be resumed.   If large square, outer bandage is left in place, this can be removed after 24 hours from your procedure. Do not remove steri-strips or glue as below.   Monitor your defibrillator site for redness, swelling, and drainage. Call the device clinic at [phone] if you experience these symptoms or fever/chills.  If your incision is sealed with Steri-strips or staples, you may shower 10 days after your procedure or when told by your provider. Do not remove the steri-strips or let the shower hit directly on your site. You may wash around your site with soap and water.    If you were discharged in a sling, please do not wear this during the day more than 48 hours after your surgery unless otherwise instructed. This may increase the risk of stiffness and soreness in your shoulder.   Avoid lotions, ointments, or perfumes over your incision until it is well-healed.  You may use a hot tub or a pool AFTER your wound check appointment if the incision is completely closed.  Your ICD is designed to protect you  from life threatening heart rhythms. Because of this, you may receive a shock.   1 shock with no symptoms:  Call the office during business hours. 1 shock with symptoms (chest pain, chest pressure, dizziness, lightheadedness, shortness of breath, overall feeling unwell):  Call 911. If you experience 2 or more shocks in 24 hours:  Call 911. If you receive a shock, you should not drive for 6 months per the [place] DMV IF you receive appropriate therapy from your ICD.   ICD Alerts:  Some alerts are vibratory and others beep. These are NOT emergencies. Please call our office to let us know. If this occurs at night or on weekends, it can wait until the next business day. Send a remote transmission.  If your device is capable of reading fluid status (for heart failure), you will be offered monthly monitoring to review this with you.   DEVICE MANAGEMENT Remote monitoring is used to monitor your ICD from home. This monitoring is scheduled every 91 days by our office. It allows [place] to keep an eye on the functioning of your device to ensure it is working properly. You will routinely see your Electrophysiologist annually (more often if necessary).   You should receive your ID card for your new device in 4-8 weeks. Keep this card with you at all times once received. Consider wearing a medical alert bracelet or necklace.  Your ICD  may be MRI compatible. This will be discussed at your  next office visit/wound check.  You should avoid contact with strong electric or magnetic fields.   Do not use amateur (ham) radio equipment or electric (arc) welding torches. MP3 player headphones with magnets should not be used. Some devices are safe to use if held at least 12 inches (30 cm) from your defibrillator. These include power tools, lawn mowers, and speakers. If you are unsure if something is safe to use, ask your health care provider.  When using your cell phone, hold it to the ear that is on the opposite side from the  defibrillator. Do not leave your cell phone in a pocket over the defibrillator.  You may safely use electric blankets, heating pads, computers, and microwave ovens.  Call the office right away if: You have chest pain. You feel more than one shock. You feel more short of breath than you have felt before. You feel more light-headed than you have felt before. Your incision starts to open up.  This information is not intended to replace advice given to you by your health care provider. Make sure you discuss any questions you have with your health care provider.

## 2021-02-11 NOTE — Discharge Summary (Signed)
ELECTROPHYSIOLOGY PROCEDURE DISCHARGE SUMMARY    Patient ID: Wyatt Donovan,  MRN: [id number], DOB/AGE: 1985/11/04 35 y.o.  Admit date: 02/10/2021 Discharge date: 02/11/2021  Primary Care Physician: Leonie Douglas, MD  Primary Cardiologist: Loralie Champagne, MD  Electrophysiologist: [name], MD   Primary Diagnosis:  Chronic systolic CHF  Ischemic cardiomyopathy  Secondary Diagnosis: NYHA III symptoms  Allergies  Allergen Reactions   Codeine Itching     Procedures This Admission:  1.  Implantation of a [employer] dual chamber ICD on 02/10/2021 by Dr. Quentin Ore.  The patient received a [employer] number [phone] ICD with model number [phone] right atrial lead and [phone] right ventricular lead.  DFT's were deferred at time of implant. There were no immediate post procedure complications. 2.  CXR on 02/11/21  demonstrated no pneumothorax status post device implantation.  Brief HPI: Wyatt Donovan is a 35 y.o. male was referred to electrophysiology in the outpatient setting  for consideration of ICD implantation.  Past medical history includes above.  The patient has persistent LV dysfunction despite guideline directed therapy.  Risks, benefits, and alternatives to ICD implantation were reviewed with the patient who wished to proceed.   Hospital Course:  The patient was admitted and underwent implantation of a [employer] dual chamber ICD with details as outlined above. They were monitored on telemetry overnight which demonstrated NSR.  Left chest was without hematoma or ecchymosis.  The device was interrogated and found to be functioning normally.  CXR was obtained and demonstrated no pneumothorax status post device implantation..  Wound care, arm mobility, and restrictions were reviewed with the patient.  The patient was examined and considered stable for discharge to home.   The patient's discharge medications include beta blocker (coreg).  ACE/ARB/ARNi use has been chronically and persistently limited by hyperkalemia. Regarding blood thinner therapy, they were instructed to resume their Eliquis (medication name) on SUNDAY (date/time).    Physical Exam: Vitals:   02/11/21 0000 02/11/21 0100 02/11/21 0300 02/11/21 0723  BP: 94/63 94/69 111/68 106/77  Pulse: (!) 56 (!) 58 (!) 55 (!) 59  Resp: 14 16 20 12   Temp: 98.5 F (36.9 C)  98.6 F (37 C) 97.9 F (36.6 C)  TempSrc: Oral  Oral Oral  SpO2: 96% 97% 96% 100%  Weight: 83.2 kg     Height:        GEN- The patient is well appearing, alert and oriented x 3 today.   HEENT: normocephalic, atraumatic; sclera clear, conjunctiva pink; hearing intact; oropharynx clear; neck supple, no JVP Lymph- no cervical lymphadenopathy Lungs- Clear to ausculation bilaterally, normal work of breathing.  No wheezes, rales, rhonchi Heart- Regular rate and rhythm, no murmurs, rubs or gallops, PMI not laterally displaced GI- soft, non-tender, non-distended, bowel sounds present, no hepatosplenomegaly Extremities- no clubbing, cyanosis, or edema; DP/PT/radial pulses 2+ bilaterally MS- no significant deformity or atrophy Skin- warm and dry, no rash or lesion. ICD site stable. Psych- euthymic mood, full affect Neuro- strength and sensation are intact   Labs:   Lab Results  Component Value Date   WBC 9.4 12/11/2020   HGB 15.1 12/11/2020   HCT 49.0 12/11/2020   MCV 91.1 12/11/2020   PLT 271 12/11/2020   No results for input(s): NA, K, CL, CO2, BUN, CREATININE, CALCIUM, PROT, BILITOT, ALKPHOS, ALT, AST, GLUCOSE in the last 168 hours.  Invalid input(s): LABALBU  Discharge Medications:  Allergies as of 02/11/2021       Reactions  Codeine Itching        Medication List     TAKE these medications    acetaminophen 325 MG tablet Commonly known as: TYLENOL Take 2 tablets (650 mg total) by mouth every 8 (eight) hours for 3 days.   amiodarone 200 MG tablet Commonly known as:  PACERONE Take 1 tablet (200 mg total) by mouth daily.   apixaban 5 MG Tabs tablet Commonly known as: ELIQUIS Take 1 tablet (5 mg total) by mouth 2 (two) times daily. Start taking on: February 16, 2021 What changed: These instructions start on February 16, 2021. If you are unsure what to do until then, ask your doctor or other care provider.   atorvastatin 80 MG tablet Commonly known as: LIPITOR TAKE 1 Tablet BY MOUTH ONCE DAILY   carvedilol 3.125 MG tablet Commonly known as: COREG Take 1 tablet (3.125 mg total) by mouth 2 (two) times daily with a meal.   clopidogrel 75 MG tablet Commonly known as: PLAVIX Take 1 tablet (75 mg total) by mouth daily.   Farxiga 10 MG Tabs tablet Generic drug: dapagliflozin propanediol Take 1 tablet (10 mg total) by mouth daily before breakfast.   nitroGLYCERIN 0.4 MG SL tablet Commonly known as: NITROSTAT Place 0.4 mg under the tongue every 5 (five) minutes as needed for chest pain.        Disposition:    Follow-up Information     [facility] Follow up.   Why: on 12/22 at 2 pm for post ICD check Contact information: [place] [id number] [phone]                Duration of Discharge Encounter: Greater than 30 minutes including physician time.  Jacalyn Lefevre, PA-C  02/11/2021 9:42 AM

## 2021-02-20 ENCOUNTER — Other Ambulatory Visit: Payer: Self-pay

## 2021-02-20 ENCOUNTER — Ambulatory Visit (INDEPENDENT_AMBULATORY_CARE_PROVIDER_SITE_OTHER): Payer: Medicaid Other

## 2021-02-20 DIAGNOSIS — I255 Ischemic cardiomyopathy: Secondary | ICD-10-CM

## 2021-02-20 LAB — CUP PACEART INCLINIC DEVICE CHECK
Date Time Interrogation Session: 20221222142102
HighPow Impedance: 67 Ohm
Implantable Lead Implant Date: 20221213
Implantable Lead Implant Date: 20221213
Implantable Lead Location: 753859
Implantable Lead Location: 753860
Implantable Lead Model: 673
Implantable Lead Model: 7841
Implantable Lead Serial Number: 1211072
Implantable Lead Serial Number: 160080
Implantable Pulse Generator Implant Date: 20221213
Lead Channel Impedance Value: 510 Ohm
Lead Channel Impedance Value: 515 Ohm
Lead Channel Pacing Threshold Amplitude: 0.7 V
Lead Channel Pacing Threshold Amplitude: 0.9 V
Lead Channel Pacing Threshold Pulse Width: 0.4 ms
Lead Channel Pacing Threshold Pulse Width: 1 ms
Lead Channel Sensing Intrinsic Amplitude: 3.5 mV
Lead Channel Sensing Intrinsic Amplitude: 5.6 mV
Lead Channel Setting Pacing Amplitude: 3.5 V
Lead Channel Setting Pacing Amplitude: 3.5 V
Lead Channel Setting Pacing Pulse Width: 0.4 ms
Lead Channel Setting Sensing Sensitivity: 0.5 mV
Pulse Gen Serial Number: 617464

## 2021-02-20 NOTE — Patient Instructions (Signed)

## 2021-02-20 NOTE — Progress Notes (Signed)
Wound check appointment. Steri-strips removed. Wound without redness or edema. Incision edges approximated, wound well healed. Normal device function. Thresholds, sensing, and impedances consistent with implant measurements. Device programmed at 3.5V/auto capture programmed on for extra safety margin until 3 month visit. Histogram distribution appropriate for patient and level of activity. No mode switches or high ventricular rates noted. 1 RA noise event logged on date of implant. Patient educated about wound care, arm mobility, lifting restrictions. ROV in 3 months with Dr. Quentin Ore.

## 2021-02-23 ENCOUNTER — Telehealth: Payer: Self-pay | Admitting: Physician Assistant

## 2021-02-23 NOTE — Telephone Encounter (Signed)
° °  Patient called because he has a cold and wanted to know if he could take Coricidin HPB  I checked interactions with his medications and did not find any.  I reassured him that as long as it was the plain Coricidin HPB, he was fine.   Rosaria Ferries, PA-C 02/23/2021 12:04 PM

## 2021-03-13 ENCOUNTER — Encounter (HOSPITAL_COMMUNITY): Payer: Self-pay | Admitting: Cardiology

## 2021-03-13 ENCOUNTER — Other Ambulatory Visit: Payer: Self-pay

## 2021-03-13 ENCOUNTER — Other Ambulatory Visit (HOSPITAL_COMMUNITY): Payer: Self-pay

## 2021-03-13 ENCOUNTER — Ambulatory Visit (HOSPITAL_COMMUNITY)
Admission: RE | Admit: 2021-03-13 | Discharge: 2021-03-13 | Disposition: A | Discharge status: Home / Self Care | Payer: Medicaid Other | Source: Ambulatory Visit | Attending: Cardiology | Admitting: Cardiology

## 2021-03-13 VITALS — BP 100/60 | HR 63 | Wt 191.2 lb

## 2021-03-13 DIAGNOSIS — I4892 Unspecified atrial flutter: Secondary | ICD-10-CM | POA: Diagnosis not present

## 2021-03-13 DIAGNOSIS — I5022 Chronic systolic (congestive) heart failure: Secondary | ICD-10-CM | POA: Diagnosis not present

## 2021-03-13 DIAGNOSIS — Z79899 Other long term (current) drug therapy: Secondary | ICD-10-CM | POA: Diagnosis not present

## 2021-03-13 DIAGNOSIS — I252 Old myocardial infarction: Secondary | ICD-10-CM | POA: Insufficient documentation

## 2021-03-13 DIAGNOSIS — Z7901 Long term (current) use of anticoagulants: Secondary | ICD-10-CM | POA: Insufficient documentation

## 2021-03-13 DIAGNOSIS — I2510 Atherosclerotic heart disease of native coronary artery without angina pectoris: Secondary | ICD-10-CM | POA: Insufficient documentation

## 2021-03-13 DIAGNOSIS — E875 Hyperkalemia: Secondary | ICD-10-CM | POA: Diagnosis not present

## 2021-03-13 DIAGNOSIS — Z955 Presence of coronary angioplasty implant and graft: Secondary | ICD-10-CM | POA: Diagnosis not present

## 2021-03-13 DIAGNOSIS — Z951 Presence of aortocoronary bypass graft: Secondary | ICD-10-CM | POA: Diagnosis not present

## 2021-03-13 DIAGNOSIS — I959 Hypotension, unspecified: Secondary | ICD-10-CM | POA: Diagnosis not present

## 2021-03-13 DIAGNOSIS — I513 Intracardiac thrombosis, not elsewhere classified: Secondary | ICD-10-CM | POA: Insufficient documentation

## 2021-03-13 DIAGNOSIS — Z87891 Personal history of nicotine dependence: Secondary | ICD-10-CM | POA: Insufficient documentation

## 2021-03-13 DIAGNOSIS — Z7902 Long term (current) use of antithrombotics/antiplatelets: Secondary | ICD-10-CM | POA: Diagnosis not present

## 2021-03-13 DIAGNOSIS — I4891 Unspecified atrial fibrillation: Secondary | ICD-10-CM | POA: Insufficient documentation

## 2021-03-13 LAB — CBC
HCT: 48.2 % (ref 39.0–52.0)
Hemoglobin: 15.5 g/dL (ref 13.0–17.0)
MCH: 30.8 pg (ref 26.0–34.0)
MCHC: 32.2 g/dL (ref 30.0–36.0)
MCV: 95.6 fL (ref 80.0–100.0)
Platelets: 231 10*3/uL (ref 150–400)
RBC: 5.04 MIL/uL (ref 4.22–5.81)
RDW: 15.6 % — ABNORMAL HIGH (ref 11.5–15.5)
WBC: 9 10*3/uL (ref 4.0–10.5)
nRBC: 0 % (ref 0.0–0.2)

## 2021-03-13 LAB — COMPREHENSIVE METABOLIC PANEL
ALT: 41 U/L (ref 0–44)
AST: 30 U/L (ref 15–41)
Albumin: 3.5 g/dL (ref 3.5–5.0)
Alkaline Phosphatase: 69 U/L (ref 38–126)
Anion gap: 10 (ref 5–15)
BUN: 14 mg/dL (ref 6–20)
CO2: 24 mmol/L (ref 22–32)
Calcium: 9.7 mg/dL (ref 8.9–10.3)
Chloride: 111 mmol/L (ref 98–111)
Creatinine, Ser: 0.87 mg/dL (ref 0.61–1.24)
GFR, Estimated: >60 mL/min (ref >60)
Glucose, Bld: 97 mg/dL (ref 70–99)
Potassium: 4.4 mmol/L (ref 3.5–5.1)
Sodium: 145 mmol/L (ref 135–145)
Total Bilirubin: 0.5 mg/dL (ref 0.3–1.2)
Total Protein: 6.6 g/dL (ref 6.5–8.1)

## 2021-03-13 LAB — BRAIN NATRIURETIC PEPTIDE: B Natriuretic Peptide: 235.4 pg/mL — ABNORMAL HIGH (ref 0.0–100.0)

## 2021-03-13 LAB — TSH: TSH: 2.069 u[IU]/mL (ref 0.350–4.500)

## 2021-03-13 MED ORDER — SPIRONOLACTONE 25 MG PO TABS: 12.5000 mg | ORAL_TABLET | Freq: Every day | ORAL | 3 refills | Status: DC

## 2021-03-13 MED ORDER — LOKELMA 5 G PO PACK: 5.0000 g | PACK | Freq: Every day | ORAL | 6 refills | Status: DC

## 2021-03-13 MED ORDER — CLOPIDOGREL BISULFATE 75 MG PO TABS: 75.0000 mg | ORAL_TABLET | Freq: Every day | ORAL | 6 refills | Status: DC

## 2021-03-13 NOTE — Patient Instructions (Addendum)
START Spironolactone 12.5mg  (1/2 tab) daily  START Lokelma 5g (1 packet) daily  A refill has been sent for Plavix  Labs today and repeat in 1 week in [place] We will only contact you if something comes back abnormal or we need to make some changes. Otherwise no news is good news!  You have been referred to Cardiac Rehab at [facility].  You will get a call to schedule this appointment.  Your physician recommends that you schedule a follow-up appointment in: 3 and 6 weeks with the pharmacists and 2 months with Dr Aundra Dubin  Please call office at [phone] option 2 if you have any questions or concerns.   At the [facility], you and your health needs are our priority. As part of our continuing mission to provide you with exceptional heart care, we have created designated Provider Care Teams. These Care Teams include your primary Cardiologist (physician) and Advanced Practice Providers (APPs- Physician Assistants and Nurse Practitioners) who all work together to provide you with the care you need, when you need it.   You may see any of the following providers on your designated Care Team at your next follow up: Dr Glori Bickers Dr Haynes Kerns, NP Lyda Jester, [place] [facility] [place], [place] Audry Riles, PharmD   Please be sure to bring in all your medications bottles to every appointment.

## 2021-03-13 NOTE — Progress Notes (Signed)
Advanced Heart Failure Clinic Note   Primary Care: Leonie Douglas, MD HF Cardiologist: [name] EP: Dr Quentin Ore   HPI: Patient is a 36 y.o. with history of early onset CAD s/p CABG, ischemic cardiomyopathy, and LV thrombus. Patient had initial MI in 2012 at age 38, PCI to LAD.  He had inferoposterior MI in 4/22.  LHC showed 3 vessel disease, and patient had CABG x 4. Cardiac MRI in 5/22 showed LV EF 27% with LV thrombus, there was significant viability.  Post-op, patient had GI bleeding and anticoagulation was stopped.  He quit smoking after CABG.    Follow up 10/04/20 carvedilol decreased due to dizziness and repeat echo arranged.  Echo 8/22 EF 25-30%. He was referred to EP for ICD consideration.   Admitted 12/03/20 with NSTEMI. Coronary angiogram showed patent LIMA to LAD with occlusion of all other grafts. S/p PCI/DES to native mid LCX 99% stenosis.  Native LAD and RCA occluded. Angina resolved post PCI. Farxiga and Coreg added back, however spiro and losartan held due to hyperkalemia during this admission. His hospitalization was complicated by atrial fibrillation and flutter with RVR. He received IV amiodarone and started on Eliquis.  He underwent DCCV with conversion to NSR on 12/06/20. Discharged home 12/06/20, weight 177.5 lbs.  He returns for followup of CHF.  He seems to be doing better.  He has gained some weight and is eating more (was underweight).  He continues to use his cane with longer walks, for balance and he says that it makes him "feel more comfortable."  He is short of breath after walking about 100 yards, this is stable.  No orthopnea/PND.  No chest pain.  He is staying off cigarettes.   ECG (personally reviewed): NSR, 1st degree AVB, old inferior MI, residual anterior STE  Labs (10/22): K 4.5, creatinine 0.95, LDL 45  REDS clip 30%   PMH: 1. Hyperlipidemia 2. CAD: MI at age 58 in 2012, PCI to LAD.   - Inferoposterior MI in 4/22: LHC showed 3 vessel disease.  CABG  with LIMA-LAD, radial to OM1, sequential SVG-PDA and D1.  - NSTEMI-->LHC (10/22): patent LIMA to LAD, all other grafts occluded. S/p PCI DES native mid LCx 99% stenosis, LAD and RCA totally occluded.  3. Post-operative GI bleeding after CABG 4. Chronic systolic CHF: Ischemic cardiomyopathy.   - Cardiac MRI (5/22): LV EF 27%, RV EF 57%, apical thrombus, extensive viability.  - Echo (8/22) EF 20-25% - Echo (10/22): EF 35-40% - cMRI (10/22): LVEF 29%, RV EF 45%, no LV thrombus, LGE suggestive of prior MI.  - [employer] ICD  5. LV thrombus  - No thrombus on 10/22 cMRI.  6. Prior smoker: Quit 5/22.  7. H/o hyperkalemia 8. Atrial fibrillation/flutter: Paroxysmal.  - DCCV 10/22 9. ABIs (5/22): Normal.   Current Outpatient Medications  Medication Sig Dispense Refill   amiodarone (PACERONE) 200 MG tablet Take 1 tablet (200 mg total) by mouth daily. 30 tablet 6   apixaban (ELIQUIS) 5 MG TABS tablet Take 1 tablet (5 mg total) by mouth 2 (two) times daily. 60 tablet 6   atorvastatin (LIPITOR) 80 MG tablet TAKE 1 Tablet BY MOUTH ONCE DAILY 30 tablet 2   carvedilol (COREG) 3.125 MG tablet Take 1 tablet (3.125 mg total) by mouth 2 (two) times daily with a meal. 180 tablet 3   dapagliflozin propanediol (FARXIGA) 10 MG TABS tablet Take 1 tablet (10 mg total) by mouth daily before breakfast. 30 tablet 2  nitroGLYCERIN (NITROSTAT) 0.4 MG SL tablet Place 0.4 mg under the tongue every 5 (five) minutes as needed for chest pain.     sodium zirconium cyclosilicate (LOKELMA) 5 g packet Take 5 g by mouth daily. 30 packet 6   spironolactone (ALDACTONE) 25 MG tablet Take 0.5 tablets (12.5 mg total) by mouth daily. 45 tablet 3   clopidogrel (PLAVIX) 75 MG tablet Take 1 tablet (75 mg total) by mouth daily. 30 tablet 6   No current facility-administered medications for this encounter.   Allergies  Allergen Reactions   Codeine Itching   Social History   Socioeconomic History   Marital status: Single     Spouse name: Not on file   Number of children: Not on file   Years of education: Not on file   Highest education level: High school graduate  Occupational History   Occupation: Not actively working    Comment: applied for disability x 2.   Tobacco Use   Smoking status: Former    Packs/day: 1.00    Years: 22.00    Pack years: 22.00    Types: Cigarettes, Cigars    Quit date: 06/28/2020    Years since quitting: 0.7   Smokeless tobacco: Never   Tobacco comments:    cigarettes stopped in 2020, [employer] started in 2018-04-05/day  Vaping Use   Vaping Use: Never used  Substance and Sexual Activity   Alcohol use: Never   Drug use: Not Currently    Types: Marijuana   Sexual activity: Never  Other Topics Concern   Not on file  Social History Narrative   Single   Lives with his parents   Trying to get his GED   DeGent date all cultures   Social Determinants of Health   Financial Resource Strain: Medium Risk   Difficulty of Paying Living Expenses: Somewhat hard  Food Insecurity: No Food Insecurity   Worried About Charity fundraiser in the Last Year: Never true   Ran Out of Food in the Last Year: Never true  Transportation Needs: No Transportation Needs   Lack of Transportation (Medical): No   Lack of Transportation (Non-Medical): No  Physical Activity: Not on file  Stress: Not on file  Social Connections: Not on file  Intimate Partner Violence: Not on file   Family History  Problem Relation Age of Onset   Hypertension Neg Hx    Diabetes Neg Hx    Coronary artery disease Neg Hx    BP 100/60    Pulse 63    Wt 86.7 kg (191 lb 3.2 oz)    SpO2 97%    BMI 25.23 kg/m   Wt Readings from Last 3 Encounters:  03/13/21 86.7 kg (191 lb 3.2 oz)  02/11/21 83.2 kg (183 lb 6.8 oz)  01/07/21 82.3 kg (181 lb 6.4 oz)   General: NAD Neck: No JVD, no thyromegaly or thyroid nodule.  Lungs: Clear to auscultation bilaterally with normal respiratory effort. CV: Nondisplaced PMI.  Heart  regular S1/S2, no S3/S4, no murmur.  No peripheral edema.  No carotid bruit.  Normal pedal pulses.  Abdomen: Soft, nontender, no hepatosplenomegaly, no distention.  Skin: Intact without lesions or rashes.  Neurologic: Alert and oriented x 3.  Psych: Normal affect. Extremities: No clubbing or cyanosis.  HEENT: Normal.   ASSESSMENT & PLAN:  1. CAD: early onset CAD s/p MI with LAD PCI in 2012,followed by CABG  x 4 4/22  Admit 10/22 with NSTEMI. LHC this admission with  patent LIMA to LAD, all other grafts occluded. S/p PCI DES native mid Lcx 99% stenosis. No chest pain.  - Continue Plavix at least 6 months, ideally 1 year.  - He is on Eliquis so no ASA.   - Continue atorvastatin 80 mg, good lipids in 10/22.  2. Chronic Systolic CHF: Ischemic cardiomyopathy.  [place].  Most recent study was a cardiac MRI in 10/22 with LVEF 29% and RVEF 45%.  NYHA class II symptoms, he is not volume overloaded on exam or by REDS clip. GDMT has been limited by hypotension & hyperkalemia.  - Continue Coreg 3.125 mg bid. - Continue Farxiga 10 mg daily.  - I will start him on spironolactone 12.5 mg daily + Lokelma 5 g daily given history of hyperkalemia.  BMET today and again in 1 week.  - Refer to cardiac re[facility] at [facility].  3. 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 Anticoagulation previously stopped d/t GI bleed. No thrombus on echo 08/22. No thrombus on cMRI 12/06/20. - He is now on Eliquis 5 mg twice a day, CBC today.  4. Hyperkalemia: He has had a tendency towards hyperkalemia.  I think that he will need Lokelma in order to take spironolactone and ARB/ARNI.  See above.  5. Atrial flutter/fibrillation: Both arrhythmias noted in 10/22.  He tolerated poorly with worsened symptoms.  He had DCCV in 10/22.  He is maintaining NSR on amiodarone, but amiodarone not ideal in a patient this young.   - Continue amiodarone 200 mg daily, check LFTs and TSH today. He will need a regular eye exam.  - I will ask Dr.  Quentin Ore about the possibility of fib + flutter ablation to allow him to more safely stop amiodarone.  - Continue Eliquis.  6. Former Smoker: Last cigarette 06/30/20  Followup HF pharmacist in 3 wks x 2 visits for med titration, see me in 2 months.    Loralie Champagne 03/13/21

## 2021-04-04 ENCOUNTER — Other Ambulatory Visit (HOSPITAL_COMMUNITY): Payer: Self-pay | Admitting: *Deleted

## 2021-04-04 MED ORDER — ATORVASTATIN CALCIUM 80 MG PO TABS: 80.0000 mg | ORAL_TABLET | Freq: Every day | ORAL | 11 refills | Status: DC

## 2021-04-07 NOTE — Progress Notes (Incomplete)
***In Progress***    Advanced Heart Failure Clinic Note   Primary Care: Leonie Douglas, MD HF Cardiologist: [name] EP: Dr Quentin Ore   HPI:  Patient is a 36 y.o. with history of early onset CAD s/p CABG, ischemic cardiomyopathy, and LV thrombus. Patient had initial MI in 2012 at age 62, PCI to LAD.  He had inferoposterior MI in 05/2020.  LHC showed 3 vessel disease, and patient had CABG x 4. Cardiac MRI in 06/2020 showed LV EF 27% with LV thrombus, there was significant viability.  Post-op, patient had GI bleeding and anticoagulation was stopped.  He quit smoking after CABG.    Follow up 10/04/20 carvedilol decreased due to dizziness and repeat echo arranged.  Echo 09/2020 EF 25-30%. He was referred to EP for ICD consideration.   Admitted 12/03/20 with NSTEMI. Coronary angiogram showed patent LIMA to LAD with occlusion of all other grafts. S/p PCI/DES to native mid LCX 99% stenosis.  Native LAD and RCA occluded. Angina resolved post PCI. Farxiga and Coreg added back, however spironolactone and losartan held due to hyperkalemia during that admission. His hospitalization was complicated by atrial fibrillation and flutter with RVR. He received IV amiodarone and started on Eliquis.  He underwent DCCV with conversion to NSR on 12/06/20. Discharged home 12/06/20, weight 177.5 lbs.   He returned for followup of CHF with MD on 03/13/21.  He seemed to be doing better.  He had gained some weight and was eating more (was underweight).  He continued to use his cane with longer walks, for balance and he said that it makes him "feel more comfortable."  He reported being short of breath after walking about 100 yards, which was stable.  No orthopnea/PND.  No chest pain.  He continued to stay off cigarettes. REDS clip was 30%.   Today he returns to HF clinic for pharmacist medication titration. At last visit with MD ***.   Overall feeling ***. Dizziness, lightheadedness, fatigue:  Chest pain or palpitations:  How  is your breathing?: *** SOB: Able to complete all ADLs. Activity level ***  Weight at home pounds. Takes furosemide/torsemide/bumex *** mg *** daily.  LEE PND/Orthopnea  Appetite *** Low-salt diet:   Physical Exam Cost/affordability of meds   HF Medications: Carvedilol 3.125 mg BID Spironolactone 12.5 mg daily Farxiga 10 mg daily Lokelma 5 g daily  Has the patient been experiencing any side effects to the medications prescribed?  {YES NO:22349}  Does the patient have any problems obtaining medications due to transportation or finances?   {YES NO:22349}  Understanding of regimen: {excellent/good/fair/poor:19665} Understanding of indications: {excellent/good/fair/poor:19665} Potential of compliance: {excellent/good/fair/poor:19665} Patient understands to avoid NSAIDs. Patient understands to avoid decongestants.    Pertinent Lab Values: 03/13/21: Serum creatinine 0.87, BUN 14, Potassium 4.4, Sodium 145, BNP 235.4  Vital Signs: Weight: *** (last clinic weight: 191.2 lb) Blood pressure: ***  Heart rate: ***   Assessment/Plan: 1. CAD: early onset CAD s/p MI with LAD PCI in 2012, followed by CABG  x 4 05/2020  Admit 11/2020 with NSTEMI. LHC that admission with patent LIMA to LAD, all other grafts occluded. S/p PCI DES native mid Lcx 99% stenosis. No chest pain.  - Continue Plavix at least 6 months, ideally 1 year.  - He is on Eliquis so no ASA.   - Continue atorvastatin 80 mg, good lipids in 11/2020.  2. Chronic Systolic CHF: Ischemic cardiomyopathy.  [place].  Most recent study was a cardiac MRI in 11/2020 with LVEF 29%  and RVEF 45%.  NYHA class II symptoms, he is not volume overloaded on exam or by *** REDS clip. GDMT has been limited by hypotension & hyperkalemia.  - Continue Coreg 3.125 mg bid.  - Continue Farxiga 10 mg daily.  - I will start him on spironolactone 12.5 mg daily + Lokelma 5 g daily given history of hyperkalemia.  BMET today and again in 1 week.   - Refer to cardiac rehab at [facility].  3. LV thrombus: Noted on cMRI 06/2020. Anticoagulation previously stopped d/t GI bleed. No thrombus on echo 09/2020. No thrombus on cMRI 12/06/20. - He is now on Eliquis 5 mg twice a day, CBC today.  4. Hyperkalemia: He has had a tendency towards hyperkalemia.  I think that he will need Lokelma in order to take spironolactone and ARB/ARNI.  See above.  5. Atrial flutter/fibrillation: Both arrhythmias noted in 11/2020.  He tolerated poorly with worsened symptoms.  He had DCCV in 11/2020.  He is maintaining NSR on amiodarone, but amiodarone not ideal in a patient this young.   - Continue amiodarone 200 mg daily, check LFTs and TSH today. He will need a regular eye exam.  - I will ask Dr. Quentin Ore about the possibility of fib + flutter ablation to allow him to more safely stop amiodarone.  - Continue Eliquis.  6. Former Smoker: Last cigarette 06/30/20  Follow up pharmacist visit 04/24/21.    Audry Riles, PharmD, BCPS, BCCP, CPP Heart Failure Clinic Pharmacist [phone]

## 2021-04-09 ENCOUNTER — Ambulatory Visit (HOSPITAL_COMMUNITY)
Admission: RE | Admit: 2021-04-09 | Discharge: 2021-04-09 | Disposition: A | Discharge status: Home / Self Care | Payer: Medicaid Other | Source: Ambulatory Visit | Attending: Internal Medicine | Admitting: Internal Medicine

## 2021-04-09 ENCOUNTER — Other Ambulatory Visit: Payer: Self-pay

## 2021-04-09 VITALS — BP 108/72 | HR 60 | Wt 186.6 lb

## 2021-04-09 DIAGNOSIS — Z7902 Long term (current) use of antithrombotics/antiplatelets: Secondary | ICD-10-CM | POA: Insufficient documentation

## 2021-04-09 DIAGNOSIS — I252 Old myocardial infarction: Secondary | ICD-10-CM | POA: Diagnosis not present

## 2021-04-09 DIAGNOSIS — Z955 Presence of coronary angioplasty implant and graft: Secondary | ICD-10-CM | POA: Diagnosis not present

## 2021-04-09 DIAGNOSIS — Z87891 Personal history of nicotine dependence: Secondary | ICD-10-CM | POA: Diagnosis not present

## 2021-04-09 DIAGNOSIS — Z7901 Long term (current) use of anticoagulants: Secondary | ICD-10-CM | POA: Diagnosis not present

## 2021-04-09 DIAGNOSIS — Z951 Presence of aortocoronary bypass graft: Secondary | ICD-10-CM | POA: Diagnosis not present

## 2021-04-09 DIAGNOSIS — I482 Chronic atrial fibrillation, unspecified: Secondary | ICD-10-CM | POA: Diagnosis not present

## 2021-04-09 DIAGNOSIS — E875 Hyperkalemia: Secondary | ICD-10-CM | POA: Insufficient documentation

## 2021-04-09 DIAGNOSIS — Z79899 Other long term (current) drug therapy: Secondary | ICD-10-CM | POA: Diagnosis not present

## 2021-04-09 DIAGNOSIS — I5022 Chronic systolic (congestive) heart failure: Secondary | ICD-10-CM | POA: Diagnosis not present

## 2021-04-09 DIAGNOSIS — Z7984 Long term (current) use of oral hypoglycemic drugs: Secondary | ICD-10-CM | POA: Diagnosis not present

## 2021-04-09 DIAGNOSIS — I255 Ischemic cardiomyopathy: Secondary | ICD-10-CM | POA: Diagnosis not present

## 2021-04-09 DIAGNOSIS — I2510 Atherosclerotic heart disease of native coronary artery without angina pectoris: Secondary | ICD-10-CM | POA: Diagnosis present

## 2021-04-09 DIAGNOSIS — Z95818 Presence of other cardiac implants and grafts: Secondary | ICD-10-CM | POA: Diagnosis not present

## 2021-04-09 DIAGNOSIS — I513 Intracardiac thrombosis, not elsewhere classified: Secondary | ICD-10-CM

## 2021-04-09 LAB — BASIC METABOLIC PANEL
Anion gap: 11 (ref 5–15)
BUN: 14 mg/dL (ref 6–20)
CO2: 23 mmol/L (ref 22–32)
Calcium: 9.6 mg/dL (ref 8.9–10.3)
Chloride: 110 mmol/L (ref 98–111)
Creatinine, Ser: 1.04 mg/dL (ref 0.61–1.24)
GFR, Estimated: >60 mL/min (ref >60)
Glucose, Bld: 97 mg/dL (ref 70–99)
Potassium: 4.5 mmol/L (ref 3.5–5.1)
Sodium: 144 mmol/L (ref 135–145)

## 2021-04-09 MED ORDER — CARVEDILOL 3.125 MG PO TABS: 3.1250 mg | ORAL_TABLET | Freq: Two times a day (BID) | ORAL | 3 refills | Status: DC

## 2021-04-09 MED ORDER — SPIRONOLACTONE 25 MG PO TABS: 25.0000 mg | ORAL_TABLET | Freq: Every day | ORAL | 3 refills | Status: DC

## 2021-04-09 NOTE — Progress Notes (Signed)
Advanced Heart Failure Clinic Note   Primary Care: Leonie Douglas, MD HF Cardiologist: [name] EP: Dr Quentin Ore   HPI:  Patient is a 36 y.o. with history of early onset CAD s/p CABG, ischemic cardiomyopathy, and LV thrombus. Patient had initial MI in 2012 at age 52, PCI to LAD.  He had inferoposterior MI in 05/2020.  LHC showed 3 vessel disease, and patient had CABG x 4. Cardiac MRI in 06/2020 showed LV EF 27% with LV thrombus, there was significant viability.  Post-op, patient had GI bleeding and anticoagulation was stopped.  He quit smoking after CABG.    Follow up 10/04/20 carvedilol decreased due to dizziness and repeat echo arranged.  Echo 09/2020 EF 25-30%. He was referred to EP for ICD consideration.   Admitted 12/03/20 with NSTEMI. Coronary angiogram showed patent LIMA to LAD with occlusion of all other grafts. S/p PCI/DES to native mid LCX 99% stenosis.  Native LAD and RCA occluded. Angina resolved post PCI. Farxiga and Coreg added back, however spironolactone and losartan held due to hyperkalemia during that admission. His hospitalization was complicated by atrial fibrillation and flutter with RVR. He received IV amiodarone and started on Eliquis.  He underwent DCCV with conversion to NSR on 12/06/20. Discharged home 12/06/20, weight 177.5 lbs.   He returned for followup of CHF with MD on 03/13/21.  He seemed to be doing better.  He had gained some weight and was eating more (was underweight).  He continued to use his cane with longer walks, for balance and he said that it makes him "feel more comfortable."  He reported being short of breath after walking about 100 yards, which was stable.  No orthopnea/PND.  No chest pain.  He continued to stay off cigarettes. REDS clip was 30%.   Today he returns to HF clinic for pharmacist medication titration. At last visit with MD spironolactone 12.5 mg daily and Lokelma 5 g daily were started. BMET was planned for 1 week. Patient says he was not able to  come in for this because his grandmother passed away. He reports overall feeling good. Denies lightheadedness, chest pain, palpitations. Reports dizziness only when he bends down to pick something up. Uses a cane for balance. He reports being a little more fatigued lately. Reports SOB at rest twice per week (unclear if anxiety related). Able to walk for about 10 minutes before becoming SOB. Starts cardiac rehab 04/21/21. No PND/orthopnea. Does not need any loop diuretic. Appetite has been good but he has cut out eating sugar. Avoids salt.   HF Medications: Carvedilol 3.125 mg BID Spironolactone 12.5 mg daily Farxiga 10 mg daily Lokelma 5 g daily  Has the patient been experiencing any side effects to the medications prescribed?  no  Does the patient have any problems obtaining medications due to transportation or finances?   No. Has Medicaid and reports all of his medications are free. His father or brother take him to the pharmacy to pick up his medicines.   Understanding of regimen: good Understanding of indications: good Potential of compliance: good Patient understands to avoid NSAIDs. Patient understands to avoid decongestants.    Pertinent Lab Values: 04/09/21: Serum creatinine 1.04, BUN 14, Potassium 4.5, Sodium 144  Vital Signs: Weight: 186.6 lbs (last clinic weight: 191.2 lbs) Blood pressure: 108/72  Heart rate: 60    Assessment/Plan: 1. CAD: early onset CAD s/p MI with LAD PCI in 2012, followed by CABG  x4 05/2020  Admit 11/2020 with NSTEMI. LHC that admission  with patent LIMA to LAD, all other grafts occluded. S/p PCI DES native mid Lcx 99% stenosis. No chest pain.  - Continue Plavix at least 6 months, ideally 1 year.  - He is on Eliquis so no ASA.   - Continue atorvastatin 80 mg, good lipids in 11/2020.  2. Chronic Systolic CHF: Ischemic cardiomyopathy.  [place].  Most recent study was a cardiac MRI in 11/2020 with LVEF 29% and RVEF 45%.  NYHA class II symptoms,  he is not volume overloaded on exam. GDMT has been limited by hypotension & hyperkalemia.  - Continue carvedilol 3.125 mg BID.  - Increase spironolactone to 25 mg daily.  - Continue Lokelma 5 g daily.  - Continue Farxiga 10 mg daily.  - He starts cardiac rehab at [facility] on 04/21/21.  - BMET today was stable. Repeat BMET in 2 weeks at follow up visit.  3. LV thrombus: Noted on cMRI 06/2020. Anticoagulation previously stopped d/t GI bleed. No thrombus on echo 09/2020. No thrombus on cMRI 12/06/20. - He is now on Eliquis 5 mg twice daily. CBC stable at last visit.  4. Hyperkalemia: He has had a tendency towards hyperkalemia.  K is 4.5 today after starting spironolactone 12.5 mg and Lokelma 5 g daily. Will continue Lokelma at current dose with increasing spironolactone today. See above.  5. Atrial flutter/fibrillation: Both arrhythmias noted in 11/2020. He tolerated poorly with worsened symptoms.  He had DCCV in 11/2020.  He is maintaining NSR on amiodarone, but amiodarone not ideal in a patient this young.   - Continue amiodarone 200 mg daily. LFTs and TSH wnl at last visit. He will need a regular eye exam.  - Upcoming appointment with Dr. Quentin Ore to consider the possibility of fib + flutter ablation to allow him to more safely stop amiodarone.  - Continue Eliquis.  6. Former Smoker: Last cigarette 06/30/20  Follow up pharmacist visit 04/24/21.    Audry Riles, PharmD, BCPS, BCCP, CPP Heart Failure Clinic Pharmacist [phone]

## 2021-04-09 NOTE — Patient Instructions (Signed)
It was a pleasure seeing you today!  MEDICATIONS: -We are changing your medications today -Increase spironolactone to 25 mg (1 tablet) daily -Call if you have questions about your medications.  LABS: -We will call you if your labs need attention.  NEXT APPOINTMENT: Return to clinic on 04/24/21 with Lauren.  In general, to take care of your heart failure: -Limit your fluid intake to 2 Liters (half-gallon) per day.   -Limit your salt intake to ideally 2-3 grams (2000-3000 mg) per day. -Weigh yourself daily and record, and bring that "weight diary" to your next appointment.  (Weight gain of 2-3 pounds in 1 day typically means fluid weight.) -The medications for your heart are to help your heart and help you live longer.   -Please contact us before stopping any of your heart medications.  Call the clinic at [phone] with questions or to reschedule future appointments.

## 2021-04-10 NOTE — Progress Notes (Incomplete)
***In Progress***   Advanced Heart Failure Clinic Note   Primary Care: Leonie Douglas, MD HF Cardiologist: [name] EP: Dr Quentin Ore   HPI:  Patient is a 36 y.o. with history of early onset CAD s/p CABG, ischemic cardiomyopathy, and LV thrombus. Patient had initial MI in 2012 at age 58, PCI to LAD.  He had inferoposterior MI in 05/2020.  LHC showed 3 vessel disease, and patient had CABG x 4. Cardiac MRI in 06/2020 showed LV EF 27% with LV thrombus, there was significant viability.  Post-op, patient had GI bleeding and anticoagulation was stopped.  He quit smoking after CABG.    Follow up 10/04/20 carvedilol decreased due to dizziness and repeat echo arranged.  Echo 09/2020 EF 25-30%. He was referred to EP for ICD consideration.   Admitted 12/03/20 with NSTEMI. Coronary angiogram showed patent LIMA to LAD with occlusion of all other grafts. S/p PCI/DES to native mid LCX 99% stenosis.  Native LAD and RCA occluded. Angina resolved post PCI. Farxiga and Coreg added back, however spironolactone and losartan held due to hyperkalemia during that admission. His hospitalization was complicated by atrial fibrillation and flutter with RVR. He received IV amiodarone and started on Eliquis.  He underwent DCCV with conversion to NSR on 12/06/20. Discharged home 12/06/20, weight 177.5 lbs.   He returned for followup of CHF with MD on 03/13/21.  He seemed to be doing better.  He had gained some weight and was eating more (was underweight).  He continued to use his cane with longer walks, for balance and he said that it makes him "feel more comfortable."  He reported being short of breath after walking about 100 yards, which was stable.  No orthopnea/PND.  No chest pain.  He continued to stay off cigarettes. REDS clip was 30%.   Returned to HF clinic for pharmacist medication titration on 04/09/21.  Denies lightheadedness, chest pain, palpitations. Reported that he gets dizzy only when he bends down to pick something up.  Uses a cane for balance. He reported being a little more fatigued lately. Reported SOB at rest twice per week (unclear if anxiety related). Reported being able to walk for about 10 minutes before becoming SOB. Starts cardiac rehab 04/21/21. No PND/orthopnea. Does not need any loop diuretic. Appetite had been good but noted he had cut out eating sugar. Avoids salt.   Today he returns to HF clinic for pharmacist medication titration. At last visit with pharmacy clinic, spironolactone was increased to 25 mg daily.    HF Medications: Carvedilol 3.125 mg BID Spironolactone 25 mg daily Farxiga 10 mg daily Lokelma 5 g daily  Has the patient been experiencing any side effects to the medications prescribed?  no  Does the patient have any problems obtaining medications due to transportation or finances?   No. Has Medicaid and reports all of his medications are free. His father or brother take him to the pharmacy to pick up his medicines.   Understanding of regimen: good Understanding of indications: good Potential of compliance: good Patient understands to avoid NSAIDs. Patient understands to avoid decongestants.    Pertinent Lab Values: 04/09/21: Serum creatinine 1.04, BUN 14, Potassium 4.5, Sodium 144 BMET today pending  Vital Signs: *** Weight: 186.6 lbs (last clinic weight: 191.2 lbs) Blood pressure: 108/72  Heart rate: 60    Assessment/Plan: 1. CAD: early onset CAD s/p MI with LAD PCI in 2012, followed by CABG  x4 05/2020  Admit 11/2020 with NSTEMI. LHC that admission with patent  LIMA to LAD, all other grafts occluded. S/p PCI DES native mid Lcx 99% stenosis. No chest pain.  - Continue Plavix at least 6 months, ideally 1 year.  - He is on Eliquis so no ASA.   - Continue atorvastatin 80 mg, good lipids in 11/2020.  2. Chronic Systolic CHF: Ischemic cardiomyopathy.  [place].  Most recent study was a cardiac MRI in 11/2020 with LVEF 29% and RVEF 45%.   - NYHA class II symptoms,  he is not volume overloaded on exam. GDMT has been limited by hypotension & hyperkalemia.  - Continue carvedilol 3.125 mg BID.  - Continue spironolactone 25 mg daily. Continue Lokelma 5 g daily.  - Continue Farxiga 10 mg daily.  - He started cardiac rehab at [facility] on 04/21/21. *** 3. LV thrombus: Noted on cMRI 06/2020. Anticoagulation previously stopped d/t GI bleed. No thrombus on echo 09/2020. No thrombus on cMRI 12/06/20. - Continue Eliquis 5 mg BID  4. Hyperkalemia: He has had a tendency towards hyperkalemia.  K was 4.5 04/09/21 after starting spironolactone 12.5 mg and Lokelma 5 g daily.  -Continue Lokelma and spironolactone. BMET today pending.  5. Atrial flutter/fibrillation: Both arrhythmias noted in 11/2020. He tolerated poorly with worsened symptoms.  He had DCCV in 11/2020.  He is maintaining NSR on amiodarone, but amiodarone not ideal in a patient this young.   - Continue amiodarone 200 mg daily. LFTs and TSH wnl at last visit. He will need a regular eye exam.  - Upcoming appointment with Dr. Quentin Ore to consider the possibility of fib + flutter ablation to allow him to more safely stop amiodarone.  - Continue Eliquis.  6. Former Smoker: Last cigarette 06/30/20  Follow up pharmacist visit 04/24/21.    Audry Riles, PharmD, BCPS, BCCP, CPP Heart Failure Clinic Pharmacist [phone]

## 2021-04-14 ENCOUNTER — Other Ambulatory Visit: Payer: Self-pay

## 2021-04-14 ENCOUNTER — Encounter (HOSPITAL_COMMUNITY): Payer: Self-pay

## 2021-04-14 ENCOUNTER — Encounter (HOSPITAL_COMMUNITY)
Admission: RE | Admit: 2021-04-14 | Discharge: 2021-04-14 | Disposition: A | Discharge status: Home / Self Care | Payer: Medicaid Other | Source: Ambulatory Visit | Attending: Cardiology | Admitting: Cardiology

## 2021-04-14 VITALS — BP 110/64 | HR 70 | Ht 73.0 in | Wt 186.3 lb

## 2021-04-14 DIAGNOSIS — I5022 Chronic systolic (congestive) heart failure: Secondary | ICD-10-CM | POA: Insufficient documentation

## 2021-04-14 LAB — GLUCOSE, CAPILLARY: Glucose-Capillary: 102 mg/dL — ABNORMAL HIGH (ref 70–99)

## 2021-04-14 NOTE — Progress Notes (Signed)
Cardiac Individual Treatment Plan  Patient Details  Name: Wyatt Donovan MRN: [id number] Date of Birth: December 28, 1985 Referring Provider:   Flowsheet Row CARDIAC REHAB PHASE II ORIENTATION from 04/14/2021 in [place]  Referring Provider [name]       Initial Encounter Date:  Flowsheet Row CARDIAC REHAB PHASE II ORIENTATION from 04/14/2021 in [place]  Date 04/14/21       Visit Diagnosis: Chronic systolic CHF (congestive heart failure) (HCC)  Patient's Home Medications on Admission:  Current Outpatient Medications:    amiodarone (PACERONE) 200 MG tablet, Take 1 tablet (200 mg total) by mouth daily., Disp: 30 tablet, Rfl: 6   apixaban (ELIQUIS) 5 MG TABS tablet, Take 1 tablet (5 mg total) by mouth 2 (two) times daily., Disp: 60 tablet, Rfl: 6   atorvastatin (LIPITOR) 80 MG tablet, Take 1 tablet (80 mg total) by mouth daily., Disp: 30 tablet, Rfl: 11   carvedilol (COREG) 3.125 MG tablet, Take 1 tablet (3.125 mg total) by mouth 2 (two) times daily with a meal., Disp: 180 tablet, Rfl: 3   clopidogrel (PLAVIX) 75 MG tablet, Take 1 tablet (75 mg total) by mouth daily., Disp: 30 tablet, Rfl: 6   dapagliflozin propanediol (FARXIGA) 10 MG TABS tablet, Take 1 tablet (10 mg total) by mouth daily before breakfast., Disp: 30 tablet, Rfl: 2   nitroGLYCERIN (NITROSTAT) 0.4 MG SL tablet, Place 0.4 mg under the tongue every 5 (five) minutes x 3 doses as needed for chest pain., Disp: , Rfl:    sodium zirconium cyclosilicate (LOKELMA) 5 g packet, Take 5 g by mouth daily., Disp: 30 packet, Rfl: 6   spironolactone (ALDACTONE) 25 MG tablet, Take 1 tablet (25 mg total) by mouth daily., Disp: 90 tablet, Rfl: 3  Past Medical History: Past Medical History:  Diagnosis Date   Atypical chest pain    CAD (coronary artery disease)    Dyslipidemia    Ex-smoker    History of depression    Insomnia    Ischemic cardiomyopathy    Ejection fraction 40-45%    NSTEMI (non-ST elevated myocardial infarction) ([facility]) 08/2007   Treated with a bare metal stent to the proximal LAD   Suicide attempt ([place]) 01/2001    Tobacco Use: Social History   Tobacco Use  Smoking Status Former   Packs/day: 1.00   Years: 22.00   Pack years: 22.00   Types: Cigarettes, Cigars   Quit date: 06/28/2020   Years since quitting: 0.7  Smokeless Tobacco Never  Tobacco Comments   cigarettes stopped in 2020, swisher sweets started in 2018-04-05/day    Labs: Recent Review Flowsheet Data     Labs for ITP Cardiac and Pulmonary Rehab Latest Ref Rng & Units 07/06/2020 07/07/2020 07/08/2020 08/08/2020 12/04/2020   Cholestrol 0 - 200 mg/dL - - - 74 94   LDLCALC 0 - 99 mg/dL - - - 39 45   HDL >40 mg/dL - - - 19(L) 25(L)   Trlycerides <150 mg/dL - - - 78 118   Hemoglobin A1c 4.8 - 5.6 % - - - - -   PHART 7.350 - 7.450 - - - - -   PCO2ART 32.0 - 48.0 mmHg - - - - -   HCO3 20.0 - 28.0 mmol/L - - - - -   TCO2 22 - 32 mmol/L - - - - -   ACIDBASEDEF 0.0 - 2.0 mmol/L - - - - -   O2SAT % 49.8 55.3 69.5 - -  Capillary Blood Glucose: Lab Results  Component Value Date   GLUCAP 102 (H) 04/14/2021   GLUCAP 91 07/07/2020   GLUCAP 74 07/07/2020   GLUCAP 94 07/07/2020   GLUCAP 83 07/07/2020    POCT Glucose     Row Name 04/14/21 1316             POCT Blood Glucose   Pre-Exercise 102 mg/dL                Exercise Target Goals: Exercise Program Goal: Individual exercise prescription set using results from initial 6 min walk test and THRR while considering  patients activity barriers and safety.   Exercise Prescription Goal: Starting with aerobic activity 30 plus minutes a day, 3 days per week for initial exercise prescription. Provide home exercise prescription and guidelines that participant acknowledges understanding prior to discharge.  Activity Barriers & Risk Stratification:  Activity Barriers & Cardiac Risk Stratification - 04/14/21 1301       Activity  Barriers & Cardiac Risk Stratification   Activity Barriers Deconditioning;Decreased Ventricular Function;Assistive Device;Balance Concerns    Cardiac Risk Stratification High             6 Minute Walk:  6 Minute Walk     Row Name 04/14/21 1355         6 Minute Walk   Phase Initial     Distance 1100 feet     Walk Time 6 minutes     # of Rest Breaks 0     MPH 2.1     METS 4.44     RPE 10     VO2 Peak 15.54     Symptoms No     Resting HR 70 bpm     Resting BP 110/64     Resting Oxygen Saturation  97 %     Exercise Oxygen Saturation  during 6 min walk 96 %     Max Ex. HR 86 bpm     Max Ex. BP 120/65     2 Minute Post BP 113/63              Oxygen Initial Assessment:   Oxygen Re-Evaluation:   Oxygen Discharge (Final Oxygen Re-Evaluation):   Initial Exercise Prescription:  Initial Exercise Prescription - 04/14/21 1300       Date of Initial Exercise RX and Referring Provider   Date 04/14/21    Referring Provider [name]    Expected Discharge Date 07/04/21      Treadmill   MPH 1.2    Grade 0    Minutes 17      Recumbant Elliptical   Level 1    Minutes 22      Prescription Details   Frequency (times per week) 3    Duration Progress to 30 minutes of continuous aerobic without signs/symptoms of physical distress      Intensity   THRR 40-80% of Max Heartrate 73-146    Ratings of Perceived Exertion 11-13    Perceived Dyspnea 0-4      Resistance Training   Training Prescription Yes    Weight 3    Reps 10-15             Perform Capillary Blood Glucose checks as needed.  Exercise Prescription Changes:   Exercise Comments:   Exercise Goals and Review:   Exercise Goals     Row Name 04/14/21 1358             Exercise Goals  Increase Physical Activity Yes       Intervention Provide advice, education, support and counseling about physical activity/exercise needs.;Develop an individualized exercise prescription for aerobic and  resistive training based on initial evaluation findings, risk stratification, comorbidities and participant's personal goals.       Expected Outcomes Short Term: Attend rehab on a regular basis to increase amount of physical activity.;Long Term: Add in home exercise to make exercise part of routine and to increase amount of physical activity.;Long Term: Exercising regularly at least 3-5 days a week.       Increase Strength and Stamina Yes       Intervention Provide advice, education, support and counseling about physical activity/exercise needs.;Develop an individualized exercise prescription for aerobic and resistive training based on initial evaluation findings, risk stratification, comorbidities and participant's personal goals.       Expected Outcomes Short Term: Increase workloads from initial exercise prescription for resistance, speed, and METs.;Short Term: Perform resistance training exercises routinely during rehab and add in resistance training at home;Long Term: Improve cardiorespiratory fitness, muscular endurance and strength as measured by increased METs and functional capacity (6MWT)       Able to understand and use rate of perceived exertion (RPE) scale Yes       Intervention Provide education and explanation on how to use RPE scale       Expected Outcomes Short Term: Able to use RPE daily in rehab to express subjective intensity level;Long Term:  Able to use RPE to guide intensity level when exercising independently       Knowledge and understanding of Target Heart Rate Range (THRR) Yes       Intervention Provide education and explanation of THRR including how the numbers were predicted and where they are located for reference       Expected Outcomes Short Term: Able to use daily as guideline for intensity in rehab;Long Term: Able to use THRR to govern intensity when exercising independently;Short Term: Able to state/look up THRR       Able to check pulse independently Yes        Intervention Provide education and demonstration on how to check pulse in carotid and radial arteries.;Review the importance of being able to check your own pulse for safety during independent exercise       Expected Outcomes Long Term: Able to check pulse independently and accurately;Short Term: Able to explain why pulse checking is important during independent exercise       Understanding of Exercise Prescription Yes       Intervention Provide education, explanation, and written materials on patient's individual exercise prescription       Expected Outcomes Short Term: Able to explain program exercise prescription;Long Term: Able to explain home exercise prescription to exercise independently                Exercise Goals Re-Evaluation :    Discharge Exercise Prescription (Final Exercise Prescription Changes):   Nutrition:  Target Goals: Understanding of nutrition guidelines, daily intake of sodium 1500mg , cholesterol 200mg , calories 30% from fat and 7% or less from saturated fats, daily to have 5 or more servings of fruits and vegetables.  Biometrics:  Pre Biometrics - 04/14/21 1359       Pre Biometrics   Height 6\' 1"  (1.854 m)    Weight 84.5 kg    Waist Circumference 41 inches    Hip Circumference 38 inches    Waist to Hip Ratio 1.08 %    BMI (  Calculated) 24.58    Triceps Skinfold 7 mm    % Body Fat 22.4 %    Grip Strength 9 kg    Flexibility 10 in    Single Leg Stand 0 seconds              Nutrition Therapy Plan and Nutrition Goals:  Nutrition Therapy & Goals - 04/14/21 1304       Intervention Plan   Intervention Prescribe, educate and counsel regarding individualized specific dietary modifications aiming towards targeted core components such as weight, hypertension, lipid management, diabetes, heart failure and other comorbidities.;Nutrition handout(s) given to patient.    Expected Outcomes Short Term Goal: Understand basic principles of dietary content,  such as calories, fat, sodium, cholesterol and nutrients.;Long Term Goal: Adherence to prescribed nutrition plan.             Nutrition Assessments:  Nutrition Assessments - 04/14/21 1304       MEDFICTS Scores   Pre Score 36            MEDIFICTS Score Key: ?70 Need to make dietary changes  40-70 Heart Healthy Diet ? 40 Therapeutic Level Cholesterol Diet   Picture Your Plate Scores: <08 Unhealthy dietary pattern with much room for improvement. 41-50 Dietary pattern unlikely to meet recommendations for good health and room for improvement. 51-60 More healthful dietary pattern, with some room for improvement.  >60 Healthy dietary pattern, although there may be some specific behaviors that could be improved.    Nutrition Goals Re-Evaluation:   Nutrition Goals Discharge (Final Nutrition Goals Re-Evaluation):   Psychosocial: Target Goals: Acknowledge presence or absence of significant depression and/or stress, maximize coping skills, provide positive support system. Participant is able to verbalize types and ability to use techniques and skills needed for reducing stress and depression.  Initial Review & Psychosocial Screening:  Initial Psych Review & Screening - 04/14/21 1302       Initial Review   Current issues with History of Depression   He reports taking Zoloft previously, but states that he has not taken it since 2010.     Family Dynamics   Good Support System? Yes    Comments His support system includes his dad and his brother.      Barriers   Psychosocial barriers to participate in program There are no identifiable barriers or psychosocial needs.      Screening Interventions   Interventions Encouraged to exercise    Expected Outcomes Short Term goal: Identification and review with participant of any Quality of Life or Depression concerns found by scoring the questionnaire.;Long Term goal: The participant improves quality of Life and PHQ9 Scores as seen by  post scores and/or verbalization of changes             Quality of Life Scores:  Quality of Life - 04/14/21 1400       Quality of Life   Select Quality of Life      Quality of Life Scores   Health/Function Pre 12.2 %    Socioeconomic Pre 14.79 %    Psych/Spiritual Pre 19.5 %    Family Pre 14.45 %    GLOBAL Pre 13.64 %            Scores of 19 and below usually indicate a poorer quality of life in these areas.  A difference of  2-3 points is a clinically meaningful difference.  A difference of 2-3 points in the total score of the Quality of Life Index  has been associated with significant improvement in overall quality of life, self-image, physical symptoms, and general health in studies assessing change in quality of life.  PHQ-9: Recent Review Flowsheet Data     Depression screen [facility] 2/9 04/14/2021   Decreased Interest 1   Down, Depressed, Hopeless 1   PHQ - 2 Score 2   Altered sleeping 1   Tired, decreased energy 1   Change in appetite 0   Feeling bad or failure about yourself  0   Trouble concentrating 1   Moving slowly or fidgety/restless 1   Suicidal thoughts 0   PHQ-9 Score 6   Difficult doing work/chores Somewhat difficult      Interpretation of Total Score  Total Score Depression Severity:  1-4 = Minimal depression, 5-9 = Mild depression, 10-14 = Moderate depression, 15-19 = Moderately severe depression, 20-27 = Severe depression   Psychosocial Evaluation and Intervention:  Psychosocial Evaluation - 04/14/21 1350       Psychosocial Evaluation & Interventions   Interventions Encouraged to exercise with the program and follow exercise prescription;Relaxation education;Stress management education    Comments Pt's only barrier to participation in CR is transportation, but he reports that he will be using [facility] transportation for his visits. He will set this up when he gets home today. He has no identifiable psychosocial issues. He does has a history of  depression, but has not taken Zoloft or any other medicaiton since about 2010. He scored a 8 on his PHQ-9. He reports due to his health and his inability to work that he feels like a burden on his dad, who is his caretaker. He has applied for disability, but has not heard anything back yet. I have set up a vocational rehab referral with [facility], and they will call him later today. He had requested to meet with a councelor to see if there are any jobs that he could work. He reports that he has a good support system with his father and brother. His goals while in the program are to lose weight, gain strength/stamina, and to be able to carry on a home exercise routine on his own. He has an interest in improving his health. He is eager to start the program.    Expected Outcomes Pt will continue to have no identifiable psychosocial issues.    Continue Psychosocial Services  No Follow up required             Psychosocial Re-Evaluation:   Psychosocial Discharge (Final Psychosocial Re-Evaluation):   Vocational Rehabilitation: Provide vocational rehab assistance to qualifying candidates.   Vocational Rehab Evaluation & Intervention:  Vocational Rehab - 04/14/21 1311       Initial Vocational Rehab Evaluation & Intervention   Assessment shows need for Vocational Rehabilitation Yes   He would like to have a referral sent to vocational rehab.            Education: Education Goals: Education classes will be provided on a weekly basis, covering required topics. Participant will state understanding/return demonstration of topics presented.  Learning Barriers/Preferences:  Learning Barriers/Preferences - 04/14/21 1307       Learning Barriers/Preferences   Learning Barriers None    Learning Preferences Individual Instruction;Skilled Demonstration;Video;Group Instruction             Education Topics: Hypertension, Hypertension Reduction -Define heart disease and high blood  pressure. Discus how high blood pressure affects the body and ways to reduce high blood pressure.   Exercise and Your Heart -  Discuss why it is important to exercise, the FITT principles of exercise, normal and abnormal responses to exercise, and how to exercise safely.   Angina -Discuss definition of angina, causes of angina, treatment of angina, and how to decrease risk of having angina.   Cardiac Medications -Review what the following cardiac medications are used for, how they affect the body, and side effects that may occur when taking the medications.  Medications include Aspirin, Beta blockers, calcium channel blockers, ACE Inhibitors, angiotensin receptor blockers, diuretics, digoxin, and antihyperlipidemics.   Congestive Heart Failure -Discuss the definition of CHF, how to live with CHF, the signs and symptoms of CHF, and how keep track of weight and sodium intake.   Heart Disease and Intimacy -Discus the effect sexual activity has on the heart, how changes occur during intimacy as we age, and safety during sexual activity.   Smoking Cessation / COPD -Discuss different methods to quit smoking, the health benefits of quitting smoking, and the definition of COPD.   Nutrition I: Fats -Discuss the types of cholesterol, what cholesterol does to the heart, and how cholesterol levels can be controlled.   Nutrition II: Labels -Discuss the different components of food labels and how to read food label   Heart Parts/Heart Disease and PAD -Discuss the anatomy of the heart, the pathway of blood circulation through the heart, and these are affected by heart disease.   Stress I: Signs and Symptoms -Discuss the causes of stress, how stress may lead to anxiety and depression, and ways to limit stress.   Stress II: Relaxation -Discuss different types of relaxation techniques to limit stress.   Warning Signs of Stroke / TIA -Discuss definition of a stroke, what the signs and  symptoms are of a stroke, and how to identify when someone is having stroke.   Knowledge Questionnaire Score:  Knowledge Questionnaire Score - 04/14/21 1308       Knowledge Questionnaire Score   Pre Score 18/24             Core Components/Risk Factors/Patient Goals at Admission:  Personal Goals and Risk Factors at Admission - 04/14/21 1314       Core Components/Risk Factors/Patient Goals on Admission    Weight Management Yes;Weight Loss    Intervention Weight Management: Develop a combined nutrition and exercise program designed to reach desired caloric intake, while maintaining appropriate intake of nutrient and fiber, sodium and fats, and appropriate energy expenditure required for the weight goal.;Weight Management: Provide education and appropriate resources to help participant work on and attain dietary goals.;Weight Management/Obesity: Establish reasonable short term and long term weight goals.;Obesity: Provide education and appropriate resources to help participant work on and attain dietary goals.    Expected Outcomes Short Term: Continue to assess and modify interventions until short term weight is achieved;Long Term: Adherence to nutrition and physical activity/exercise program aimed toward attainment of established weight goal;Weight Maintenance: Understanding of the daily nutrition guidelines, which includes 25-35% calories from fat, 7% or less cal from saturated fats, less than 200mg  cholesterol, less than 1.5gm of sodium, & 5 or more servings of fruits and vegetables daily;Weight Loss: Understanding of general recommendations for a balanced deficit meal plan, which promotes 1-2 lb weight loss per week and includes a negative energy balance of [phone] kcal/d;Understanding recommendations for meals to include 15-35% energy as protein, 25-35% energy from fat, 35-60% energy from carbohydrates, less than 200mg  of dietary cholesterol, 20-35 gm of total fiber daily;Understanding of  distribution of calorie intake throughout  the day with the consumption of 4-5 meals/snacks    Personal Goal Other Yes    Personal Goal Improve strength and stamina, get an exercise routine going forward.    Intervention Patient will come to cardiac rehab and engage in a home exercise program.    Expected Outcomes He will improve his strength and stamina, and have enough knowledge to carry on a home exercise routine once he finishes with the program.             Core Components/Risk Factors/Patient Goals Review:    Core Components/Risk Factors/Patient Goals at Discharge (Final Review):    ITP Comments:   Comments: Patient arrived for 1st visit/orientation/education at 1230. Patient was referred to CR by [name] due to Chronic systolic CHF (F00.71). During orientation advised patient on arrival and appointment times what to wear, what to do before, during and after exercise. Reviewed attendance and class policy.  Pt is scheduled to return Cardiac Rehab on 04/21/2021 at 0930. Pt was advised to come to class 15 minutes before class starts.  Discussed RPE/Dpysnea scales. Patient participated in warm up stretches. Patient was able to complete 6 minute walk test.  Telemetry: NSR. Patient was measured for the equipment. Discussed equipment safety with patient. Took patient pre-anthropometric measurements. Patient finished visit at 1345.

## 2021-04-21 ENCOUNTER — Encounter (HOSPITAL_COMMUNITY)
Admission: RE | Admit: 2021-04-21 | Discharge: 2021-04-21 | Disposition: A | Discharge status: Home / Self Care | Payer: Medicaid Other | Source: Ambulatory Visit | Attending: Cardiology | Admitting: Cardiology

## 2021-04-21 DIAGNOSIS — I5022 Chronic systolic (congestive) heart failure: Secondary | ICD-10-CM | POA: Diagnosis not present

## 2021-04-21 LAB — GLUCOSE, CAPILLARY: Glucose-Capillary: 98 mg/dL (ref 70–99)

## 2021-04-21 NOTE — Progress Notes (Signed)
Daily Session Note  Patient Details  Name: Wyatt Donovan MRN: [id number] Date of Birth: [birth year] Referring Provider:   Flowsheet Row CARDIAC REHAB PHASE II ORIENTATION from 04/14/2021 in [place]  Referring Provider [name]       Encounter Date: 04/21/2021  Check In:  Session Check In - 04/21/21 0930       Check-In   Supervising physician immediately available to respond to emergencies [facility] MD immediately available    Physician(s) Dr. Harl Bowie    Location [facility]    Staff Present [name], BS, Exercise Physiologist;Riley Papin Kris Mouton, MS, ACSM-CEP, Exercise Physiologist;[name], RN, BSN    Virtual Visit No    Medication changes reported     No    Fall or balance concerns reported    Yes    Comments He uses a cane due to his balance, but he has not fallen. He reports getting off balance but not falling.    Tobacco Cessation No Change    Warm-up and Cool-down Performed as group-led instruction    Resistance Training Performed Yes    VAD Patient? No    PAD/SET Patient? No      Pain Assessment   Currently in Pain? No/denies    Multiple Pain Sites No             Capillary Blood Glucose: No results found for this or any previous visit (from the past 24 hour(s)).    Social History   Tobacco Use  Smoking Status Former   Packs/day: 1.00   Years: 22.00   Pack years: 22.00   Types: Cigarettes, Cigars   Quit date: 06/28/2020   Years since quitting: 0.8  Smokeless Tobacco Never  Tobacco Comments   cigarettes stopped in 2020, swisher sweets started in 2018-04-05/day    Goals Met:  Independence with exercise equipment Exercise tolerated well No report of concerns or symptoms today Strength training completed today  Goals Unmet:  Not Applicable  Comments: checkout time is 1030   [name] is Medical Director for [facility]

## 2021-04-23 ENCOUNTER — Encounter (HOSPITAL_COMMUNITY)
Admission: RE | Admit: 2021-04-23 | Discharge: 2021-04-23 | Disposition: A | Discharge status: Home / Self Care | Payer: Medicaid Other | Source: Ambulatory Visit | Attending: Cardiology | Admitting: Cardiology

## 2021-04-23 DIAGNOSIS — I5022 Chronic systolic (congestive) heart failure: Secondary | ICD-10-CM | POA: Diagnosis not present

## 2021-04-23 LAB — GLUCOSE, CAPILLARY: Glucose-Capillary: 114 mg/dL — ABNORMAL HIGH (ref 70–99)

## 2021-04-23 NOTE — Progress Notes (Signed)
Daily Session Note  Patient Details  Name: Wyatt Donovan MRN: [id number] Date of Birth: [birth year] Referring Provider:   Flowsheet Row CARDIAC REHAB PHASE II ORIENTATION from 04/14/2021 in [place]  Referring Provider [name]       Encounter Date: 04/23/2021  Check In:  Session Check In - 04/23/21 0930       Check-In   Supervising physician immediately available to respond to emergencies [facility] MD immediately available    Physician(s) Dr. Domenic Polite    Location [facility]    Staff Present [name], BS, Exercise Physiologist;Dalton Kris Mouton, MS, ACSM-CEP, Exercise Physiologist;Nathanyal Ashmead Wynetta Emery, RN, BSN    Virtual Visit No    Medication changes reported     No    Fall or balance concerns reported    Yes    Comments He uses a cane due to his balance, but he has not fallen. He reports getting off balance but not falling.    Tobacco Cessation No Change    Warm-up and Cool-down Performed as group-led instruction    Resistance Training Performed Yes    VAD Patient? No    PAD/SET Patient? No      Pain Assessment   Currently in Pain? No/denies    Multiple Pain Sites No             Capillary Blood Glucose: Results for orders placed or performed during the hospital encounter of 04/23/21 (from the past 24 hour(s))  Glucose, capillary     Status: Abnormal   Collection Time: 04/23/21  9:18 AM  Result Value Ref Range   Glucose-Capillary 114 (H) 70 - 99 mg/dL      Social History   Tobacco Use  Smoking Status Former   Packs/day: 1.00   Years: 22.00   Pack years: 22.00   Types: Cigarettes, Cigars   Quit date: 06/28/2020   Years since quitting: 0.8  Smokeless Tobacco Never  Tobacco Comments   cigarettes stopped in 2020, swisher sweets started in 2018-04-05/day    Goals Met:  Independence with exercise equipment Exercise tolerated well No report of concerns or symptoms today Strength training completed today  Goals  Unmet:  Not Applicable  Comments: Check out 1030.   [name] is Medical Director for [facility]

## 2021-04-24 ENCOUNTER — Inpatient Hospital Stay (HOSPITAL_COMMUNITY): Admission: RE | Admit: 2021-04-24 | Payer: Medicaid Other | Source: Ambulatory Visit

## 2021-04-24 NOTE — Progress Notes (Signed)
?Advanced Heart Failure Clinic Note  ? ?Primary Care: Leonie Douglas, MD ?HF Cardiologist: [name] ?EP: Dr Quentin Ore  ? ?HPI:  ?Patient is a 36 y.o. with history of early onset CAD s/p CABG, ischemic cardiomyopathy, and LV thrombus. Patient had initial MI in 2012 at age 23, PCI to LAD.  He had inferoposterior MI in 05/2020.  LHC showed 3 vessel disease, and patient had CABG x 4. Cardiac MRI in 06/2020 showed LV EF 27% with LV thrombus, there was significant viability.  Post-op, patient had GI bleeding and anticoagulation was stopped.  He quit smoking after CABG.  ?  ?Follow up 10/04/20 carvedilol decreased due to dizziness and repeat echo arranged.  Echo 09/2020 EF 25-30%. He was referred to EP for ICD consideration. ?  ?Admitted 12/03/20 with NSTEMI. Coronary angiogram showed patent LIMA to LAD with occlusion of all other grafts. S/p PCI/DES to native mid LCX 99% stenosis.  Native LAD and RCA occluded. Angina resolved post PCI. Farxiga and Coreg added back, however spironolactone and losartan held due to hyperkalemia during that admission. His hospitalization was complicated by atrial fibrillation and flutter with RVR. He received IV amiodarone and started on Eliquis.  He underwent DCCV with conversion to NSR on 12/06/20. Discharged home 12/06/20, weight 177.5 lbs. ?  ?He returned for followup of CHF with MD on 03/13/21.  He seemed to be doing better.  He had gained some weight and was eating more (was underweight).  He continued to use his cane with longer walks, for balance and he said that it makes him "feel more comfortable."  He reported being short of breath after walking about 100 yards, which was stable.  No orthopnea/PND.  No chest pain.  He continued to stay off cigarettes. REDS clip was 30%.  ? ?Returned to HF clinic for pharmacist medication titration on 04/09/21.  Denied lightheadedness, chest pain and palpitations. Reported that he gets dizzy only when he bends down to pick something up. Noted that he uses  a cane for balance. He reported being a little more fatigued lately. Reported SOB at rest twice per week (unclear if anxiety related). Reported being able to walk for about 10 minutes before becoming SOB. Noted that he will start cardiac rehab 04/21/21. No PND/orthopnea. Does not need any loop diuretic. Appetite had been good but noted he had cut out eating sugar. Avoids salt.  ? ?Today he returns to HF clinic for pharmacist medication titration. At last visit with pharmacy clinic, spironolactone was increased to 25 mg daily. Overall feeling well today. He was started on Bactrim for a sinus infection earlier this week. Started cardiac rehab last Monday. No dizziness or lightheadedness. No chest pain or palpitations. Noted he uses the treadmill for 17 minutes at 1.5 MPH and can use the elliptical for 17 minutes before getting SOB. Still using cane to walk for balance. Weight has been stable at home, 186-188 lbs. He does not need a loop diuretic. No LEE, PND or orthopnea. Taking all medications as prescribed and tolerating all medications.  ? ? ?HF Medications: ?Carvedilol 3.125 mg BID ?Spironolactone 25 mg daily ?Farxiga 10 mg daily ?Lokelma 5 g daily ? ?Has the patient been experiencing any side effects to the medications prescribed?  no ? ?Does the patient have any problems obtaining medications due to transportation or finances?   No. Has Medicaid and reports all of his medications are free. His father or brother take him to the pharmacy to pick up his medicines.  ? ?  Understanding of regimen: good ?Understanding of indications: good ?Potential of compliance: good ?Patient understands to avoid NSAIDs. ?Patient understands to avoid decongestants. ?  ? ?Pertinent Lab Values: ?04/09/21: Serum creatinine 1.04, BUN 14, Potassium 4.5, Sodium 144 ?05/01/21: Serum creatinine 0.94, BUN 19, Potassium 4.2, Sodium 142 ? ?Vital Signs:  ?Weight: 188 lbs (last clinic weight: 186.6 lbs) ?Blood pressure: 112/68 ?Heart rate: 59    ? ?Assessment/Plan: ?1. CAD: early onset CAD s/p MI with LAD PCI in 2012, followed by CABG x4 05/2020  Admit 11/2020 with NSTEMI. LHC that admission with patent LIMA to LAD, all other grafts occluded. S/p PCI DES native mid Lcx 99% stenosis. No chest pain.  ?- Continue Plavix at least 6 months, ideally 1 year.  ?- He is on Eliquis so no ASA.   ?- Continue atorvastatin 80 mg daily, good lipids in 11/2020.  ?2. Chronic Systolic CHF: Ischemic cardiomyopathy.  [place].  Most recent study was a cardiac MRI in 11/2020 with LVEF 29% and RVEF 45%.   ?- NYHA class II symptoms, he is not volume overloaded on exam. GDMT has been limited by hypotension & hyperkalemia.  ?-BMET today stable ?- Continue carvedilol 3.125 mg BID.  ?- Start losartan 12.5 mg every evening. Repeat BMET in 3 weeks.  ?- Continue spironolactone 25 mg daily. Continue Lokelma 5 g daily.  ?- Continue Farxiga 10 mg daily.  ?- He started cardiac re[facility] at [facility] on 04/21/21.  ?3. LV thrombus: Noted on cMRI 06/2020. Anticoagulation previously stopped d/t GI bleed. No thrombus on echo 09/2020. No thrombus on cMRI 12/06/20. ?- Continue Eliquis 5 mg BID  ?4. Hyperkalemia: He has had a tendency towards hyperkalemia.  K was 4.5 04/09/21 after starting spironolactone 12.5 mg and Lokelma 5 g daily.  ?-Continue Lokelma and spironolactone. BMET today stable, K 4.2.  ?5. Atrial flutter/fibrillation: Both arrhythmias noted in 11/2020. He tolerated poorly with worsened symptoms.  He had DCCV in 11/2020.  He is maintaining NSR on amiodarone, but amiodarone not ideal in a patient this young.   ?- Continue amiodarone 200 mg daily. LFTs and TSH wnl 03/2021. He will need a regular eye exam.  ?- Upcoming appointment with Dr. Quentin Ore to consider the possibility of fib + flutter ablation to allow him to more safely stop amiodarone.  ?- Continue Eliquis.  ?6. Former Smoker: Last cigarette 06/30/20 ? ?Follow up in 3 weeks with [name].  ? ? ?Audry Riles, PharmD, BCPS,  BCCP, CPP ?Heart Failure Clinic Pharmacist ?[phone] ?  ?

## 2021-04-25 ENCOUNTER — Encounter (HOSPITAL_COMMUNITY)
Admission: RE | Admit: 2021-04-25 | Discharge: 2021-04-25 | Disposition: A | Discharge status: Home / Self Care | Payer: Medicaid Other | Source: Ambulatory Visit | Attending: Cardiology | Admitting: Cardiology

## 2021-04-25 DIAGNOSIS — I5022 Chronic systolic (congestive) heart failure: Secondary | ICD-10-CM | POA: Diagnosis not present

## 2021-04-25 LAB — GLUCOSE, CAPILLARY: Glucose-Capillary: 103 mg/dL — ABNORMAL HIGH (ref 70–99)

## 2021-04-25 NOTE — Progress Notes (Signed)
Daily Session Note  Patient Details  Name: Wyatt Donovan MRN: [id number] Date of Birth: May 17, 1985 Referring Provider:   Flowsheet Row CARDIAC REHAB PHASE II ORIENTATION from 04/14/2021 in [place]  Referring Provider [name]       Encounter Date: 04/25/2021  Check In:  Session Check In - 04/25/21 0930       Check-In   Supervising physician immediately available to respond to emergencies [facility] MD immediately available    Physician(s) Dr. Domenic Polite    Location [facility]    Staff Present [name], BS, Exercise Physiologist;Cherika Jessie Kris Mouton, MS, ACSM-CEP, Exercise Physiologist;Other    Virtual Visit No    Medication changes reported     No    Fall or balance concerns reported    Yes    Comments He uses a cane due to his balance, but he has not fallen. He reports getting off balance but not falling.    Tobacco Cessation No Change    Warm-up and Cool-down Performed as group-led instruction    Resistance Training Performed Yes    VAD Patient? No    PAD/SET Patient? No      Pain Assessment   Currently in Pain? No/denies    Multiple Pain Sites No             Capillary Blood Glucose: Results for orders placed or performed during the hospital encounter of 04/25/21 (from the past 24 hour(s))  Glucose, capillary     Status: Abnormal   Collection Time: 04/25/21  9:16 AM  Result Value Ref Range   Glucose-Capillary 103 (H) 70 - 99 mg/dL      Social History   Tobacco Use  Smoking Status Former   Packs/day: 1.00   Years: 22.00   Pack years: 22.00   Types: Cigarettes, Cigars   Quit date: 06/28/2020   Years since quitting: 0.8  Smokeless Tobacco Never  Tobacco Comments   cigarettes stopped in 2020, swisher sweets started in 2018-04-05/day    Goals Met:  Independence with exercise equipment Exercise tolerated well No report of concerns or symptoms today Strength training completed today  Goals Unmet:  Not  Applicable  Comments: checkout time is 1030   [name] is Medical Director for [facility]

## 2021-04-28 ENCOUNTER — Encounter (HOSPITAL_COMMUNITY)
Admission: RE | Admit: 2021-04-28 | Discharge: 2021-04-28 | Disposition: A | Discharge status: Home / Self Care | Payer: Medicaid Other | Source: Ambulatory Visit | Attending: Cardiology | Admitting: Cardiology

## 2021-04-28 VITALS — Wt 184.7 lb

## 2021-04-28 DIAGNOSIS — I5022 Chronic systolic (congestive) heart failure: Secondary | ICD-10-CM | POA: Diagnosis not present

## 2021-04-28 NOTE — Progress Notes (Signed)
I have reviewed a Home Exercise Prescription with Wyatt Donovan . Wyatt Donovan is not currently exercising at home.  The patient was advised to walk 2 days a week for 30-45 minutes.  Wyatt Donovan and I discussed how to progress their exercise prescription.  The patient stated that their goals were build back his endurance and lose weight.  The patient stated that they understand the exercise prescription.  We reviewed exercise guidelines, target heart rate during exercise, RPE Scale, weather conditions, NTG use, endpoints for exercise, warmup and cool down.  Patient is encouraged to come to me with any questions. I will continue to follow up with the patient to assist them with progression and safety.

## 2021-04-28 NOTE — Progress Notes (Signed)
Daily Session Note  Patient Details  Name: Wyatt Donovan MRN: [id number] Date of Birth: [birth year] Referring Provider:   Flowsheet Row CARDIAC REHAB PHASE II ORIENTATION from 04/14/2021 in [place]  Referring Provider [name]       Encounter Date: 04/28/2021  Check In:  Session Check In - 04/28/21 0930       Check-In   Supervising physician immediately available to respond to emergencies [facility] MD immediately available    Physician(s) [name]    Location [facility]    Staff Present Jilda Roche, RN, Bjorn Loser, MS, ACSM-CEP, Exercise Physiologist;Heather Zigmund Anab Vivar, Exercise Physiologist    Virtual Visit No    Medication changes reported     No    Fall or balance concerns reported    Yes    Comments He uses a cane due to his balance, but he has not fallen. He reports getting off balance but not falling.    Warm-up and Cool-down Performed as group-led Higher education careers adviser Performed Yes    VAD Patient? No    PAD/SET Patient? No      Pain Assessment   Currently in Pain? No/denies             Capillary Blood Glucose: No results found for this or any previous visit (from the past 24 hour(s)).    Social History   Tobacco Use  Smoking Status Former   Packs/day: 1.00   Years: 22.00   Pack years: 22.00   Types: Cigarettes, Cigars   Quit date: 06/28/2020   Years since quitting: 0.8  Smokeless Tobacco Never  Tobacco Comments   cigarettes stopped in 2020, swisher sweets started in 2018-04-05/day    Goals Met:  Independence with exercise equipment Exercise tolerated well No report of concerns or symptoms today Strength training completed today  Goals Unmet:  Not Applicable  Comments: checkout 1030    [name] is Medical Director for [facility]

## 2021-04-30 ENCOUNTER — Encounter (HOSPITAL_COMMUNITY)
Admission: RE | Admit: 2021-04-30 | Discharge: 2021-04-30 | Disposition: A | Discharge status: Home / Self Care | Payer: Medicaid Other | Source: Ambulatory Visit | Attending: Cardiology | Admitting: Cardiology

## 2021-04-30 DIAGNOSIS — I5022 Chronic systolic (congestive) heart failure: Secondary | ICD-10-CM | POA: Insufficient documentation

## 2021-04-30 NOTE — Progress Notes (Addendum)
Daily Session Note ? ?Patient Details  ?Name: Wyatt Donovan ?MRN: [id number] ?Date of Birth: 03-Apr-1985 ?Referring Provider:   ?Flowsheet Row CARDIAC REHAB PHASE II ORIENTATION from 04/14/2021 in [place]  ?Referring Provider [name]  ? ?  ? ? ?Encounter Date: 04/30/2021 ? ?Check In: ? Session Check In - 04/30/21 0930   ? ?  ? Check-In  ? Supervising physician immediately available to respond to emergencies [facility] MD immediately available   ? Physician(s) [name]   ? Location [facility]   ? Staff Present Hoy Register, MS, ACSM-CEP, Exercise Physiologist;Heather Otho Ket, BS, Exercise Physiologist;Caela Huot Wynetta Emery, RN, BSN   ? Virtual Visit No   ? Medication changes reported     No   ? Fall or balance concerns reported    Yes   ? Comments He uses a cane due to his balance, but he has not fallen. He reports getting off balance but not falling.   ? Tobacco Cessation No Change   ? Warm-up and Cool-down Performed as group-led instruction   ? Resistance Training Performed Yes   ? VAD Patient? No   ? PAD/SET Patient? No   ?  ? Pain Assessment  ? Currently in Pain? No/denies   ? Multiple Pain Sites No   ? ?  ?  ? ?  ? ? ?Capillary Blood Glucose: ?No results found for this or any previous visit (from the past 24 hour(s)). ? ? ? ?Social History  ? ?Tobacco Use  ?Smoking Status Former  ? Packs/day: 1.00  ? Years: 22.00  ? Pack years: 22.00  ? Types: Cigarettes, Cigars  ? Quit date: 06/28/2020  ? Years since quitting: 0.8  ?Smokeless Tobacco Never  ?Tobacco Comments  ? cigarettes stopped in 2020, swisher sweets started in 2018-04-05/day  ? ? ?Goals Met:  ?Independence with exercise equipment ?Exercise tolerated well ?No report of concerns or symptoms today ?Strength training completed today ? ?Goals Unmet:  ?Not Applicable ? ?Comments: Check out 0930. ? ? ?[name] is Medical Director for [facility] ?

## 2021-04-30 NOTE — Progress Notes (Signed)
Cardiac Individual Treatment Plan  Patient Details  Name: Wyatt Donovan MRN: [id number] Date of Birth: 1985-10-08 Referring Provider:   Flowsheet Row CARDIAC REHAB PHASE II ORIENTATION from 04/14/2021 in [facility]  Referring Provider [name]       Initial Encounter Date:  Flowsheet Row CARDIAC REHAB PHASE II ORIENTATION from 04/14/2021 in [facility]  Date 04/14/21       Visit Diagnosis: Chronic systolic CHF (congestive heart failure) (HCC)  Patient's Home Medications on Admission:  Current Outpatient Medications:    amiodarone (PACERONE) 200 MG tablet, Take 1 tablet (200 mg total) by mouth daily., Disp: 30 tablet, Rfl: 6   apixaban (ELIQUIS) 5 MG TABS tablet, Take 1 tablet (5 mg total) by mouth 2 (two) times daily., Disp: 60 tablet, Rfl: 6   atorvastatin (LIPITOR) 80 MG tablet, Take 1 tablet (80 mg total) by mouth daily., Disp: 30 tablet, Rfl: 11   carvedilol (COREG) 3.125 MG tablet, Take 1 tablet (3.125 mg total) by mouth 2 (two) times daily with a meal., Disp: 180 tablet, Rfl: 3   clopidogrel (PLAVIX) 75 MG tablet, Take 1 tablet (75 mg total) by mouth daily., Disp: 30 tablet, Rfl: 6   dapagliflozin propanediol (FARXIGA) 10 MG TABS tablet, Take 1 tablet (10 mg total) by mouth daily before breakfast., Disp: 30 tablet, Rfl: 2   nitroGLYCERIN (NITROSTAT) 0.4 MG SL tablet, Place 0.4 mg under the tongue every 5 (five) minutes x 3 doses as needed for chest pain., Disp: , Rfl:    sodium zirconium cyclosilicate (LOKELMA) 5 g packet, Take 5 g by mouth daily., Disp: 30 packet, Rfl: 6   spironolactone (ALDACTONE) 25 MG tablet, Take 1 tablet (25 mg total) by mouth daily., Disp: 90 tablet, Rfl: 3  Past Medical History: Past Medical History:  Diagnosis Date   Atypical chest pain    CAD (coronary artery disease)    Dyslipidemia    Ex-smoker    History of depression    Insomnia    Ischemic cardiomyopathy    Ejection fraction 40-45%    NSTEMI (non-ST elevated myocardial infarction) ([place]) 08/2007   Treated with a bare metal stent to the proximal LAD   Suicide attempt ([place]) 01/2001    Tobacco Use: Social History   Tobacco Use  Smoking Status Former   Packs/day: 1.00   Years: 22.00   Pack years: 22.00   Types: Cigarettes, Cigars   Quit date: 06/28/2020   Years since quitting: 0.8  Smokeless Tobacco Never  Tobacco Comments   cigarettes stopped in 2020, [employer] started in 2018-04-05/day    Labs: Recent Review Flowsheet Data     Labs for ITP Cardiac and Pulmonary Rehab Latest Ref Rng & Units 07/06/2020 07/07/2020 07/08/2020 08/08/2020 12/04/2020   Cholestrol 0 - 200 mg/dL - - - 74 94   LDLCALC 0 - 99 mg/dL - - - 39 45   HDL >40 mg/dL - - - 19(L) 25(L)   Trlycerides <150 mg/dL - - - 78 118   Hemoglobin A1c 4.8 - 5.6 % - - - - -   PHART 7.350 - 7.450 - - - - -   PCO2ART 32.0 - 48.0 mmHg - - - - -   HCO3 20.0 - 28.0 mmol/L - - - - -   TCO2 22 - 32 mmol/L - - - - -   ACIDBASEDEF 0.0 - 2.0 mmol/L - - - - -   O2SAT % 49.8 55.3 69.5 - -  Capillary Blood Glucose: Lab Results  Component Value Date   GLUCAP 103 (H) 04/25/2021   GLUCAP 114 (H) 04/23/2021   GLUCAP 98 04/21/2021   GLUCAP 102 (H) 04/14/2021   GLUCAP 91 07/07/2020    POCT Glucose     Row Name 04/14/21 1316             POCT Blood Glucose   Pre-Exercise 102 mg/dL                Exercise Target Goals: Exercise Program Goal: Individual exercise prescription set using results from initial 6 min walk test and THRR while considering  patients activity barriers and safety.   Exercise Prescription Goal: Starting with aerobic activity 30 plus minutes a day, 3 days per week for initial exercise prescription. Provide home exercise prescription and guidelines that participant acknowledges understanding prior to discharge.  Activity Barriers & Risk Stratification:  Activity Barriers & Cardiac Risk Stratification - 04/14/21 1301        Activity Barriers & Cardiac Risk Stratification   Activity Barriers Deconditioning;Decreased Ventricular Function;Assistive Device;Balance Concerns    Cardiac Risk Stratification High             6 Minute Walk:  6 Minute Walk     Row Name 04/14/21 1355         6 Minute Walk   Phase Initial     Distance 1100 feet     Walk Time 6 minutes     # of Rest Breaks 0     MPH 2.1     METS 4.44     RPE 10     VO2 Peak 15.54     Symptoms No     Resting HR 70 bpm     Resting BP 110/64     Resting Oxygen Saturation  97 %     Exercise Oxygen Saturation  during 6 min walk 96 %     Max Ex. HR 86 bpm     Max Ex. BP 120/65     2 Minute Post BP 113/63              Oxygen Initial Assessment:   Oxygen Re-Evaluation:   Oxygen Discharge (Final Oxygen Re-Evaluation):   Initial Exercise Prescription:  Initial Exercise Prescription - 04/14/21 1300       Date of Initial Exercise RX and Referring Provider   Date 04/14/21    Referring Provider [name]    Expected Discharge Date 07/04/21      Treadmill   MPH 1.2    Grade 0    Minutes 17      Recumbant Elliptical   Level 1    Minutes 22      Prescription Details   Frequency (times per week) 3    Duration Progress to 30 minutes of continuous aerobic without signs/symptoms of physical distress      Intensity   THRR 40-80% of Max Heartrate 73-146    Ratings of Perceived Exertion 11-13    Perceived Dyspnea 0-4      Resistance Training   Training Prescription Yes    Weight 3    Reps 10-15             Perform Capillary Blood Glucose checks as needed.  Exercise Prescription Changes:   Exercise Prescription Changes     Row Name 04/14/21 1400 04/28/21 0900 04/28/21 1500         Response to Exercise   Blood Pressure (Admit) 110/64 --  118/78     Blood Pressure (Exercise) 120/65 -- 122/68     Blood Pressure (Exit) 116/6 -- 108/58     Heart Rate (Admit) -- -- 78 bpm     Heart Rate (Exercise) -- -- 68 bpm      Heart Rate (Exit) -- -- 58 bpm     Rating of Perceived Exertion (Exercise) -- -- 11     Duration -- -- Continue with 30 min of aerobic exercise without signs/symptoms of physical distress.     Intensity -- -- THRR unchanged       Progression   Progression -- -- Continue to progress workloads to maintain intensity without signs/symptoms of physical distress.       Resistance Training   Training Prescription -- -- Yes     Weight -- -- 3     Reps -- -- 10-15     Time -- -- 10 Minutes       Treadmill   MPH -- -- 1.5     Grade -- -- 0     Minutes -- -- 17     METs -- -- 2.15       Recumbant Elliptical   Level -- -- 2     RPM -- -- 51     Minutes -- -- 22     METs -- -- 3.2       Home Exercise Plan   Plans to continue exercise at -- Home (comment) --     Frequency -- Add 2 additional days to program exercise sessions. --     Initial Home Exercises Provided -- 04/28/21 --              Exercise Comments:   Exercise Comments     Row Name 04/28/21 0959           Exercise Comments home exercise reviewed                Exercise Goals and Review:   Exercise Goals     Row Name 04/14/21 1358 04/28/21 1554           Exercise Goals   Increase Physical Activity Yes Yes      Intervention Provide advice, education, support and counseling about physical activity/exercise needs.;Develop an individualized exercise prescription for aerobic and resistive training based on initial evaluation findings, risk stratification, comorbidities and participant's personal goals. Provide advice, education, support and counseling about physical activity/exercise needs.;Develop an individualized exercise prescription for aerobic and resistive training based on initial evaluation findings, risk stratification, comorbidities and participant's personal goals.      Expected Outcomes Short Term: Attend rehab on a regular basis to increase amount of physical activity.;Long Term: Add in home  exercise to make exercise part of routine and to increase amount of physical activity.;Long Term: Exercising regularly at least 3-5 days a week. Short Term: Attend rehab on a regular basis to increase amount of physical activity.;Long Term: Add in home exercise to make exercise part of routine and to increase amount of physical activity.;Long Term: Exercising regularly at least 3-5 days a week.      Increase Strength and Stamina Yes --      Intervention Provide advice, education, support and counseling about physical activity/exercise needs.;Develop an individualized exercise prescription for aerobic and resistive training based on initial evaluation findings, risk stratification, comorbidities and participant's personal goals. Provide advice, education, support and counseling about physical activity/exercise needs.;Develop an individualized exercise prescription for aerobic and resistive training based  on initial evaluation findings, risk stratification, comorbidities and participant's personal goals.      Expected Outcomes Short Term: Increase workloads from initial exercise prescription for resistance, speed, and METs.;Short Term: Perform resistance training exercises routinely during rehab and add in resistance training at home;Long Term: Improve cardiorespiratory fitness, muscular endurance and strength as measured by increased METs and functional capacity (6MWT) Short Term: Increase workloads from initial exercise prescription for resistance, speed, and METs.;Short Term: Perform resistance training exercises routinely during rehab and add in resistance training at home;Long Term: Improve cardiorespiratory fitness, muscular endurance and strength as measured by increased METs and functional capacity (6MWT)      Able to understand and use rate of perceived exertion (RPE) scale Yes Yes      Intervention Provide education and explanation on how to use RPE scale Provide education and explanation on how to use  RPE scale      Expected Outcomes Short Term: Able to use RPE daily in rehab to express subjective intensity level;Long Term:  Able to use RPE to guide intensity level when exercising independently Short Term: Able to use RPE daily in rehab to express subjective intensity level;Long Term:  Able to use RPE to guide intensity level when exercising independently      Knowledge and understanding of Target Heart Rate Range (THRR) Yes Yes      Intervention Provide education and explanation of THRR including how the numbers were predicted and where they are located for reference Provide education and explanation of THRR including how the numbers were predicted and where they are located for reference      Expected Outcomes Short Term: Able to use daily as guideline for intensity in rehab;Long Term: Able to use THRR to govern intensity when exercising independently;Short Term: Able to state/look up THRR Short Term: Able to use daily as guideline for intensity in rehab;Long Term: Able to use THRR to govern intensity when exercising independently;Short Term: Able to state/look up THRR      Able to check pulse independently Yes Yes      Intervention Provide education and demonstration on how to check pulse in carotid and radial arteries.;Review the importance of being able to check your own pulse for safety during independent exercise Provide education and demonstration on how to check pulse in carotid and radial arteries.;Review the importance of being able to check your own pulse for safety during independent exercise      Expected Outcomes Long Term: Able to check pulse independently and accurately;Short Term: Able to explain why pulse checking is important during independent exercise Long Term: Able to check pulse independently and accurately;Short Term: Able to explain why pulse checking is important during independent exercise      Understanding of Exercise Prescription Yes Yes      Intervention Provide  education, explanation, and written materials on patient's individual exercise prescription Provide education, explanation, and written materials on patient's individual exercise prescription      Expected Outcomes Short Term: Able to explain program exercise prescription;Long Term: Able to explain home exercise prescription to exercise independently Short Term: Able to explain program exercise prescription;Long Term: Able to explain home exercise prescription to exercise independently               Exercise Goals Re-Evaluation :  Exercise Goals Re-Evaluation     Row Name 04/28/21 1555             Exercise Goal Re-Evaluation   Exercise Goals Review Increase Physical Activity;Increase  Strength and Stamina;Able to understand and use rate of perceived exertion (RPE) scale;Knowledge and understanding of Target Heart Rate Range (THRR);Able to check pulse independently;Understanding of Exercise Prescription       Comments Pt has completed 5 session of cardiac rehab. He is motivated when he comes to class and increases his workload on his own. He is also walking at home on his own. He is currently exercising at 3.2 METs on the ellp. Will continue to monitor and progress as able.       Expected Outcomes Through exercise at rehab and at home, the patient will meet their stated goals.                 Discharge Exercise Prescription (Final Exercise Prescription Changes):  Exercise Prescription Changes - 04/28/21 1500       Response to Exercise   Blood Pressure (Admit) 118/78    Blood Pressure (Exercise) 122/68    Blood Pressure (Exit) 108/58    Heart Rate (Admit) 78 bpm    Heart Rate (Exercise) 68 bpm    Heart Rate (Exit) 58 bpm    Rating of Perceived Exertion (Exercise) 11    Duration Continue with 30 min of aerobic exercise without signs/symptoms of physical distress.    Intensity THRR unchanged      Progression   Progression Continue to progress workloads to maintain intensity  without signs/symptoms of physical distress.      Resistance Training   Training Prescription Yes    Weight 3    Reps 10-15    Time 10 Minutes      Treadmill   MPH 1.5    Grade 0    Minutes 17    METs 2.15      Recumbant Elliptical   Level 2    RPM 51    Minutes 22    METs 3.2             Nutrition:  Target Goals: Understanding of nutrition guidelines, daily intake of sodium 1500mg , cholesterol 200mg , calories 30% from fat and 7% or less from saturated fats, daily to have 5 or more servings of fruits and vegetables.  Biometrics:  Pre Biometrics - 04/14/21 1359       Pre Biometrics   Height 6\' 1"  (1.854 m)    Weight 84.5 kg    Waist Circumference 41 inches    Hip Circumference 38 inches    Waist to Hip Ratio 1.08 %    BMI (Calculated) 24.58    Triceps Skinfold 7 mm    % Body Fat 22.4 %    Grip Strength 9 kg    Flexibility 10 in    Single Leg Stand 0 seconds              Nutrition Therapy Plan and Nutrition Goals:  Nutrition Therapy & Goals - 04/21/21 1311       Personal Nutrition Goals   Comments Patient scored 36 on his diet assessment. We offer 2 educational sessions on heart heatlhy nutrition with handouts.      Intervention Plan   Intervention Nutrition handout(s) given to patient.    Expected Outcomes Short Term Goal: Understand basic principles of dietary content, such as calories, fat, sodium, cholesterol and nutrients.             Nutrition Assessments:  Nutrition Assessments - 04/14/21 1304       MEDFICTS Scores   Pre Score 36  MEDIFICTS Score Key: ?70 Need to make dietary changes  40-70 Heart Healthy Diet ? 40 Therapeutic Level Cholesterol Diet   Picture Your Plate Scores: <23 Unhealthy dietary pattern with much room for improvement. 41-50 Dietary pattern unlikely to meet recommendations for good health and room for improvement. 51-60 More healthful dietary pattern, with some room for improvement.  >60  Healthy dietary pattern, although there may be some specific behaviors that could be improved.    Nutrition Goals Re-Evaluation:   Nutrition Goals Discharge (Final Nutrition Goals Re-Evaluation):   Psychosocial: Target Goals: Acknowledge presence or absence of significant depression and/or stress, maximize coping skills, provide positive support system. Participant is able to verbalize types and ability to use techniques and skills needed for reducing stress and depression.  Initial Review & Psychosocial Screening:  Initial Psych Review & Screening - 04/14/21 1302       Initial Review   Current issues with History of Depression   He reports taking Zoloft previously, but states that he has not taken it since 2010.     Family Dynamics   Good Support System? Yes    Comments His support system includes his dad and his brother.      Barriers   Psychosocial barriers to participate in program There are no identifiable barriers or psychosocial needs.      Screening Interventions   Interventions Encouraged to exercise    Expected Outcomes Short Term goal: Identification and review with participant of any Quality of Life or Depression concerns found by scoring the questionnaire.;Long Term goal: The participant improves quality of Life and PHQ9 Scores as seen by post scores and/or verbalization of changes             Quality of Life Scores:  Quality of Life - 04/14/21 1400       Quality of Life   Select Quality of Life      Quality of Life Scores   Health/Function Pre 12.2 %    Socioeconomic Pre 14.79 %    Psych/Spiritual Pre 19.5 %    Family Pre 14.45 %    GLOBAL Pre 13.64 %            Scores of 19 and below usually indicate a poorer quality of life in these areas.  A difference of  2-3 points is a clinically meaningful difference.  A difference of 2-3 points in the total score of the Quality of Life Index has been associated with significant improvement in overall  quality of life, self-image, physical symptoms, and general health in studies assessing change in quality of life.  PHQ-9: Recent Review Flowsheet Data     Depression screen [facility] 2/9 04/14/2021   Decreased Interest 1   Down, Depressed, Hopeless 1   PHQ - 2 Score 2   Altered sleeping 1   Tired, decreased energy 1   Change in appetite 0   Feeling bad or failure about yourself  0   Trouble concentrating 1   Moving slowly or fidgety/restless 1   Suicidal thoughts 0   PHQ-9 Score 6   Difficult doing work/chores Somewhat difficult      Interpretation of Total Score  Total Score Depression Severity:  1-4 = Minimal depression, 5-9 = Mild depression, 10-14 = Moderate depression, 15-19 = Moderately severe depression, 20-27 = Severe depression   Psychosocial Evaluation and Intervention:  Psychosocial Evaluation - 04/14/21 1350       Psychosocial Evaluation & Interventions   Interventions Encouraged to exercise with  the program and follow exercise prescription;Relaxation education;Stress management education    Comments Pt's only barrier to participation in CR is transportation, but he reports that he will be using [facility] transportation for his visits. He will set this up when he gets home today. He has no identifiable psychosocial issues. He does has a history of depression, but has not taken Zoloft or any other medicaiton since about 2010. He scored a 8 on his PHQ-9. He reports due to his health and his inability to work that he feels like a burden on his dad, who is his caretaker. He has applied for disability, but has not heard anything back yet. I have set up a vocational rehab referral with [facility], and they will call him later today. He had requested to meet with a councelor to see if there are any jobs that he could work. He reports that he has a good support system with his father and brother. His goals while in the program are to lose weight, gain strength/stamina, and to be able to  carry on a home exercise routine on his own. He has an interest in improving his health. He is eager to start the program.    Expected Outcomes Pt will continue to have no identifiable psychosocial issues.    Continue Psychosocial Services  No Follow up required             Psychosocial Re-Evaluation:  Psychosocial Re-Evaluation     [place] Name 04/21/21 1300             Psychosocial Re-Evaluation   Current issues with History of Depression       Comments Patient is new to the program starting today. We will continue to monitor his progress in the program.       Expected Outcomes Patient will continue to be able to use [facility] transportation and will have no other psychosocial barriers identified.       Interventions Encouraged to attend Cardiac Rehabilitation for the exercise;Relaxation education;Stress management education       Continue Psychosocial Services  No Follow up required                Psychosocial Discharge (Final Psychosocial Re-Evaluation):  Psychosocial Re-Evaluation - 04/21/21 1300       Psychosocial Re-Evaluation   Current issues with History of Depression    Comments Patient is new to the program starting today. We will continue to monitor his progress in the program.    Expected Outcomes Patient will continue to be able to use [facility] transportation and will have no other psychosocial barriers identified.    Interventions Encouraged to attend Cardiac Rehabilitation for the exercise;Relaxation education;Stress management education    Continue Psychosocial Services  No Follow up required             Vocational Rehabilitation: Provide vocational rehab assistance to qualifying candidates.   Vocational Rehab Evaluation & Intervention:  Vocational Rehab - 04/14/21 1311       Initial Vocational Rehab Evaluation & Intervention   Assessment shows need for Vocational Rehabilitation Yes   He would like to have a referral sent to vocational rehab.             Education: Education Goals: Education classes will be provided on a weekly basis, covering required topics. Participant will state understanding/return demonstration of topics presented.  Learning Barriers/Preferences:  Learning Barriers/Preferences - 04/14/21 1307       Learning Barriers/Preferences   Learning Barriers None  Learning Preferences Individual Instruction;Skilled Demonstration;Video;Group Instruction             Education Topics: Hypertension, Hypertension Reduction -Define heart disease and high blood pressure. Discus how high blood pressure affects the body and ways to reduce high blood pressure.   Exercise and Your Heart -Discuss why it is important to exercise, the FITT principles of exercise, normal and abnormal responses to exercise, and how to exercise safely.   Angina -Discuss definition of angina, causes of angina, treatment of angina, and how to decrease risk of having angina.   Cardiac Medications -Review what the following cardiac medications are used for, how they affect the body, and side effects that may occur when taking the medications.  Medications include Aspirin, Beta blockers, calcium channel blockers, ACE Inhibitors, angiotensin receptor blockers, diuretics, digoxin, and antihyperlipidemics.   Congestive Heart Failure -Discuss the definition of CHF, how to live with CHF, the signs and symptoms of CHF, and how keep track of weight and sodium intake.   Heart Disease and Intimacy -Discus the effect sexual activity has on the heart, how changes occur during intimacy as we age, and safety during sexual activity.   Smoking Cessation / COPD -Discuss different methods to quit smoking, the health benefits of quitting smoking, and the definition of COPD.   Nutrition I: Fats -Discuss the types of cholesterol, what cholesterol does to the heart, and how cholesterol levels can be controlled. Flowsheet Row CARDIAC REHAB PHASE II  EXERCISE from 04/23/2021 in [facility]  Date 04/23/21  Educator [place]  Instruction Review Code 1- Verbalizes Understanding       Nutrition II: Labels -Discuss the different components of food labels and how to read food label   Heart Parts/Heart Disease and PAD -Discuss the anatomy of the heart, the pathway of blood circulation through the heart, and these are affected by heart disease.   Stress I: Signs and Symptoms -Discuss the causes of stress, how stress may lead to anxiety and depression, and ways to limit stress.   Stress II: Relaxation -Discuss different types of relaxation techniques to limit stress.   Warning Signs of Stroke / TIA -Discuss definition of a stroke, what the signs and symptoms are of a stroke, and how to identify when someone is having stroke.   Knowledge Questionnaire Score:  Knowledge Questionnaire Score - 04/14/21 1308       Knowledge Questionnaire Score   Pre Score 18/24             Core Components/Risk Factors/Patient Goals at Admission:  Personal Goals and Risk Factors at Admission - 04/14/21 1314       Core Components/Risk Factors/Patient Goals on Admission    Weight Management Yes;Weight Loss    Intervention Weight Management: Develop a combined nutrition and exercise program designed to reach desired caloric intake, while maintaining appropriate intake of nutrient and fiber, sodium and fats, and appropriate energy expenditure required for the weight goal.;Weight Management: Provide education and appropriate resources to help participant work on and attain dietary goals.;Weight Management/Obesity: Establish reasonable short term and long term weight goals.;Obesity: Provide education and appropriate resources to help participant work on and attain dietary goals.    Expected Outcomes Short Term: Continue to assess and modify interventions until short term weight is achieved;Long Term: Adherence to nutrition and physical  activity/exercise program aimed toward attainment of established weight goal;Weight Maintenance: Understanding of the daily nutrition guidelines, which includes 25-35% calories from fat, 7% or less cal from saturated fats,  less than 200mg  cholesterol, less than 1.5gm of sodium, & 5 or more servings of fruits and vegetables daily;Weight Loss: Understanding of general recommendations for a balanced deficit meal plan, which promotes 1-2 lb weight loss per week and includes a negative energy balance of [phone] kcal/d;Understanding recommendations for meals to include 15-35% energy as protein, 25-35% energy from fat, 35-60% energy from carbohydrates, less than 200mg  of dietary cholesterol, 20-35 gm of total fiber daily;Understanding of distribution of calorie intake throughout the day with the consumption of 4-5 meals/snacks    Personal Goal Other Yes    Personal Goal Improve strength and stamina, get an exercise routine going forward.    Intervention Patient will come to cardiac rehab and engage in a home exercise program.    Expected Outcomes He will improve his strength and stamina, and have enough knowledge to carry on a home exercise routine once he finishes with the program.             Core Components/Risk Factors/Patient Goals Review:   Goals and Risk Factor Review     Row Name 04/21/21 1302             Core Components/Risk Factors/Patient Goals Review   Personal Goals Review Weight Management/Obesity;Heart Failure;Other       Review Patient was referred to CR with Chronic Systolic HF. He started the program today. He has multiple risk factors for CAD and is participating in the program for risk modification. His initial weight was 185.0. His personal goals for the program are to lose weight; gain strength and stamina; and continue to exercise routinely. We will continue to monitor his progress as he works towards meeting these goals.       Expected Outcomes Patient will complete the  program meeting both personal and program goals.                Core Components/Risk Factors/Patient Goals at Discharge (Final Review):   Goals and Risk Factor Review - 04/21/21 1302       Core Components/Risk Factors/Patient Goals Review   Personal Goals Review Weight Management/Obesity;Heart Failure;Other    Review Patient was referred to CR with Chronic Systolic HF. He started the program today. He has multiple risk factors for CAD and is participating in the program for risk modification. His initial weight was 185.0. His personal goals for the program are to lose weight; gain strength and stamina; and continue to exercise routinely. We will continue to monitor his progress as he works towards meeting these goals.    Expected Outcomes Patient will complete the program meeting both personal and program goals.             ITP Comments:   Comments: ITP REVIEW Pt is making expected progress toward Cardiac Rehab goals after completing 5 sessions. Recommend continued exercise, life style modification, education, and increased stamina and strength.

## 2021-05-01 ENCOUNTER — Other Ambulatory Visit: Payer: Self-pay

## 2021-05-01 ENCOUNTER — Telehealth (HOSPITAL_COMMUNITY): Payer: Self-pay | Admitting: Pharmacist

## 2021-05-01 ENCOUNTER — Ambulatory Visit (HOSPITAL_COMMUNITY)
Admission: RE | Admit: 2021-05-01 | Discharge: 2021-05-01 | Disposition: A | Discharge status: Home / Self Care | Payer: Medicaid Other | Source: Ambulatory Visit | Attending: Cardiology | Admitting: Cardiology

## 2021-05-01 VITALS — BP 112/68 | HR 59 | Wt 188.0 lb

## 2021-05-01 DIAGNOSIS — I252 Old myocardial infarction: Secondary | ICD-10-CM | POA: Insufficient documentation

## 2021-05-01 DIAGNOSIS — Z951 Presence of aortocoronary bypass graft: Secondary | ICD-10-CM | POA: Insufficient documentation

## 2021-05-01 DIAGNOSIS — I513 Intracardiac thrombosis, not elsewhere classified: Secondary | ICD-10-CM

## 2021-05-01 DIAGNOSIS — Z955 Presence of coronary angioplasty implant and graft: Secondary | ICD-10-CM | POA: Diagnosis not present

## 2021-05-01 DIAGNOSIS — Z87891 Personal history of nicotine dependence: Secondary | ICD-10-CM | POA: Insufficient documentation

## 2021-05-01 DIAGNOSIS — I2510 Atherosclerotic heart disease of native coronary artery without angina pectoris: Secondary | ICD-10-CM | POA: Diagnosis not present

## 2021-05-01 DIAGNOSIS — I5022 Chronic systolic (congestive) heart failure: Secondary | ICD-10-CM | POA: Insufficient documentation

## 2021-05-01 DIAGNOSIS — E875 Hyperkalemia: Secondary | ICD-10-CM | POA: Insufficient documentation

## 2021-05-01 DIAGNOSIS — I8291 Chronic embolism and thrombosis of unspecified vein: Secondary | ICD-10-CM | POA: Insufficient documentation

## 2021-05-01 DIAGNOSIS — Z7901 Long term (current) use of anticoagulants: Secondary | ICD-10-CM | POA: Diagnosis not present

## 2021-05-01 DIAGNOSIS — E785 Hyperlipidemia, unspecified: Secondary | ICD-10-CM

## 2021-05-01 DIAGNOSIS — I4892 Unspecified atrial flutter: Secondary | ICD-10-CM | POA: Insufficient documentation

## 2021-05-01 DIAGNOSIS — Z95818 Presence of other cardiac implants and grafts: Secondary | ICD-10-CM

## 2021-05-01 DIAGNOSIS — I255 Ischemic cardiomyopathy: Secondary | ICD-10-CM | POA: Insufficient documentation

## 2021-05-01 LAB — BASIC METABOLIC PANEL
Anion gap: 9 (ref 5–15)
BUN: 19 mg/dL (ref 6–20)
CO2: 22 mmol/L (ref 22–32)
Calcium: 9.4 mg/dL (ref 8.9–10.3)
Chloride: 111 mmol/L (ref 98–111)
Creatinine, Ser: 0.94 mg/dL (ref 0.61–1.24)
GFR, Estimated: >60 mL/min (ref >60)
Glucose, Bld: 96 mg/dL (ref 70–99)
Potassium: 4.2 mmol/L (ref 3.5–5.1)
Sodium: 142 mmol/L (ref 135–145)

## 2021-05-01 MED ORDER — LOSARTAN POTASSIUM 25 MG PO TABS: 12.5000 mg | ORAL_TABLET | Freq: Every day | ORAL | 5 refills | Status: DC

## 2021-05-01 MED ORDER — DAPAGLIFLOZIN PROPANEDIOL 10 MG PO TABS: 10.0000 mg | ORAL_TABLET | Freq: Every day | ORAL | 11 refills | Status: DC

## 2021-05-01 NOTE — Patient Instructions (Signed)
It was a pleasure seeing you today! ? ?MEDICATIONS: ?-We are changing your medications today ?-Start losartan 12.5 mg (1/2 tablet) every evening. ?-Call if you have questions about your medications. ? ?LABS: ?-We will call you if your labs need attention. ? ?NEXT APPOINTMENT: ?Return to clinic in 3 weeks with [name]. ? ?In general, to take care of your heart failure: ?-Limit your fluid intake to 2 Liters (half-gallon) per day.   ?-Limit your salt intake to ideally 2-3 grams (2000-3000 mg) per day. ?-Weigh yourself daily and record, and bring that "weight diary" to your next appointment.  (Weight gain of 2-3 pounds in 1 day typically means fluid weight.) ?-The medications for your heart are to help your heart and help you live longer.   ?-Please contact us before stopping any of your heart medications. ? ?Call the clinic at [phone] with questions or to reschedule future appointments.  ?

## 2021-05-01 NOTE — Telephone Encounter (Signed)
Advanced Heart Failure Patient Advocate Encounter ? ?Prior Authorization for [employer] has been approved.   ? ?Effective dates: 05/01/21 through "until further notice" ? ?Audry Riles, PharmD, BCPS, BCCP, CPP ?Heart Failure Clinic Pharmacist ?[phone] ? ? ?

## 2021-05-01 NOTE — Telephone Encounter (Signed)
Patient Advocate Encounter ?  ?Received notification from [facility] that prior authorization for [employer]ade is required. ?  ?PA submitted on CoverMyMeds ?Key [id number] ?Status is pending ?  ?Will continue to follow. ? ? ?Audry Riles, PharmD, BCPS, BCCP, CPP ?Heart Failure Clinic Pharmacist ?[phone] ? ?

## 2021-05-02 ENCOUNTER — Encounter (HOSPITAL_COMMUNITY)
Admission: RE | Admit: 2021-05-02 | Discharge: 2021-05-02 | Disposition: A | Discharge status: Home / Self Care | Payer: Medicaid Other | Source: Ambulatory Visit | Attending: Cardiology | Admitting: Cardiology

## 2021-05-02 DIAGNOSIS — I5022 Chronic systolic (congestive) heart failure: Secondary | ICD-10-CM

## 2021-05-02 NOTE — Progress Notes (Signed)
Daily Session Note ? ?Patient Details  ?Name: Wyatt Donovan ?MRN: [id number] ?Date of Birth: [birth year] ?Referring Provider:   ?Flowsheet Row CARDIAC REHAB PHASE II ORIENTATION from 04/14/2021 in [place]  ?Referring Provider [name]  ? ?  ? ? ?Encounter Date: 05/02/2021 ? ?Check In: ? Session Check In - 05/02/21 0930   ? ?  ? Check-In  ? Supervising physician immediately available to respond to emergencies [facility] MD immediately available   ? Physician(s) [name]   ? Location [facility]   ? Staff Present Hoy Register, MS, ACSM-CEP, Exercise Physiologist;[name], RN, BSN;Coralyn Roselli, RN   ? Virtual Visit No   ? Medication changes reported     Yes   ? Comments Added Losartan 12.69m   ? Fall or balance concerns reported    Yes   ? Comments He uses a cane due to his balance, but he has not fallen. He reports getting off balance but not falling.   ? Tobacco Cessation No Change   ? Warm-up and Cool-down Performed as group-led instruction   ? Resistance Training Performed Yes   ? VAD Patient? No   ? PAD/SET Patient? No   ?  ? Pain Assessment  ? Currently in Pain? No/denies   ? Multiple Pain Sites No   ? ?  ?  ? ?  ? ? ?Capillary Blood Glucose: ?Results for orders placed or performed during the hospital encounter of 05/01/21 (from the past 24 hour(s))  ?Basic Metabolic Panel (BMET)     Status: None  ? Collection Time: 05/01/21 10:19 AM  ?Result Value Ref Range  ? Sodium 142 135 - 145 mmol/L  ? Potassium 4.2 3.5 - 5.1 mmol/L  ? Chloride 111 98 - 111 mmol/L  ? CO2 22 22 - 32 mmol/L  ? Glucose, Bld 96 70 - 99 mg/dL  ? BUN 19 6 - 20 mg/dL  ? Creatinine, Ser 0.94 0.61 - 1.24 mg/dL  ? Calcium 9.4 8.9 - 10.3 mg/dL  ? GFR, Estimated >60 >60 mL/min  ? Anion gap 9 5 - 15  ? ? ? ? ?Social History  ? ?Tobacco Use  ?Smoking Status Former  ? Packs/day: 1.00  ? Years: 22.00  ? Pack years: 22.00  ? Types: Cigarettes, Cigars  ? Quit date: 06/28/2020  ? Years since quitting: 0.8   ?Smokeless Tobacco Never  ?Tobacco Comments  ? cigarettes stopped in 2020, swisher sweets started in 2018-04-05/day  ? ? ?Goals Met:  ?Independence with exercise equipment ?Exercise tolerated well ?No report of concerns or symptoms today ?Strength training completed today ? ?Goals Unmet:  ?Not Applicable ? ?Comments: check out @ 10:30am ? ? ?Dr. JCarlyle Dollyis Medical Director for [facility]?

## 2021-05-05 ENCOUNTER — Encounter (HOSPITAL_COMMUNITY)
Admission: RE | Admit: 2021-05-05 | Discharge: 2021-05-05 | Disposition: A | Discharge status: Home / Self Care | Payer: Medicaid Other | Source: Ambulatory Visit | Attending: Cardiology | Admitting: Cardiology

## 2021-05-05 DIAGNOSIS — I5022 Chronic systolic (congestive) heart failure: Secondary | ICD-10-CM

## 2021-05-05 NOTE — Progress Notes (Signed)
Daily Session Note ? ?Patient Details  ?Name: Wyatt Donovan ?MRN: [id number] ?Date of Birth: [birth year] ?Referring Provider:   ?Flowsheet Row CARDIAC REHAB PHASE II ORIENTATION from 04/14/2021 in [place]  ?Referring Provider [name]  ? ?  ? ? ?Encounter Date: 05/05/2021 ? ?Check In: ? Session Check In - 05/05/21 0930   ? ?  ? Check-In  ? Supervising physician immediately available to respond to emergencies [facility] MD immediately available   ? Physician(s) Dr Johnsie Cancel   ? Location [facility]   ? Staff Present Jilda Roche, RN, Bjorn Loser, MS, ACSM-CEP, Exercise Physiologist;Heather Zigmund Keric Zehren, Exercise Physiologist   ? Virtual Visit No   ? Medication changes reported     Yes   ? Comments Added Losartan 12.39m   ? Fall or balance concerns reported    Yes   ? Comments He uses a cane due to his balance, but he has not fallen. He reports getting off balance but not falling.   ? Tobacco Cessation No Change   ? Warm-up and Cool-down Performed as group-led instruction   ? Resistance Training Performed Yes   ? VAD Patient? No   ?  ? Pain Assessment  ? Currently in Pain? No/denies   ? ?  ?  ? ?  ? ? ?Capillary Blood Glucose: ?No results found for this or any previous visit (from the past 24 hour(s)). ? ? ? ?Social History  ? ?Tobacco Use  ?Smoking Status Former  ? Packs/day: 1.00  ? Years: 22.00  ? Pack years: 22.00  ? Types: Cigarettes, Cigars  ? Quit date: 06/28/2020  ? Years since quitting: 0.8  ?Smokeless Tobacco Never  ?Tobacco Comments  ? cigarettes stopped in 2020, swisher sweets started in 2018-04-05/day  ? ? ?Goals Met:  ?Independence with exercise equipment ?Exercise tolerated well ?No report of concerns or symptoms today ? ?Goals Unmet:  ?Not Applicable ? ?Comments: checkout 1030 ? ? ? ?Dr. JCarlyle Dollyis Medical Director for [facility]?

## 2021-05-07 ENCOUNTER — Encounter (HOSPITAL_COMMUNITY)
Admission: RE | Admit: 2021-05-07 | Discharge: 2021-05-07 | Disposition: A | Discharge status: Home / Self Care | Payer: Medicaid Other | Source: Ambulatory Visit | Attending: Cardiology | Admitting: Cardiology

## 2021-05-07 DIAGNOSIS — I5022 Chronic systolic (congestive) heart failure: Secondary | ICD-10-CM | POA: Diagnosis not present

## 2021-05-07 NOTE — Progress Notes (Signed)
Daily Session Note ? ?Patient Details  ?Name: Wyatt Donovan ?MRN: [id number] ?Date of Birth: 09/02/1985 ?Referring Provider:   ?Flowsheet Row CARDIAC REHAB PHASE II ORIENTATION from 04/14/2021 in [place]  ?Referring Provider [name]  ? ?  ? ? ?Encounter Date: 05/07/2021 ? ?Check In: ? Session Check In - 05/07/21 0930   ? ?  ? Check-In  ? Supervising physician immediately available to respond to emergencies [facility] MD immediately available   ? Physician(s) Dr. Domenic Polite   ? Location [facility]   ? Staff Present Hoy Register, MS, ACSM-CEP, Exercise Physiologist;Heather Otho Ket, BS, Exercise Physiologist;Zuzu Befort Windy Kalata, RN   ? Virtual Visit No   ? Medication changes reported     No   ? Fall or balance concerns reported    Yes   ? Comments He uses a cane due to his balance, but he has not fallen. He reports getting off balance but not falling.   ? Warm-up and Cool-down Performed as group-led instruction   ? Resistance Training Performed Yes   ? VAD Patient? No   ? PAD/SET Patient? No   ?  ? Pain Assessment  ? Currently in Pain? No/denies   ? Multiple Pain Sites No   ? ?  ?  ? ?  ? ? ?Capillary Blood Glucose: ?No results found for this or any previous visit (from the past 24 hour(s)). ? ? ? ?Social History  ? ?Tobacco Use  ?Smoking Status Former  ? Packs/day: 1.00  ? Years: 22.00  ? Pack years: 22.00  ? Types: Cigarettes, Cigars  ? Quit date: 06/28/2020  ? Years since quitting: 0.8  ?Smokeless Tobacco Never  ?Tobacco Comments  ? cigarettes stopped in 2020, swisher sweets started in 2018-04-05/day  ? ? ?Goals Met:  ?Independence with exercise equipment ?Exercise tolerated well ?No report of concerns or symptoms today ?Strength training completed today ? ?Goals Unmet:  ?Not Applicable ? ?Comments: check out @ 10:30am ? ? ?[name] is Medical Director for [facility] ?

## 2021-05-09 ENCOUNTER — Encounter (HOSPITAL_COMMUNITY)
Admission: RE | Admit: 2021-05-09 | Discharge: 2021-05-09 | Disposition: A | Discharge status: Home / Self Care | Payer: Medicaid Other | Source: Ambulatory Visit | Attending: Cardiology | Admitting: Cardiology

## 2021-05-09 DIAGNOSIS — I5022 Chronic systolic (congestive) heart failure: Secondary | ICD-10-CM | POA: Diagnosis not present

## 2021-05-09 NOTE — Progress Notes (Signed)
Daily Session Note ? ?Patient Details  ?Name: Wyatt Donovan ?MRN: [id number] ?Date of Birth: 1985/03/19 ?Referring Provider:   ?Flowsheet Row CARDIAC REHAB PHASE II ORIENTATION from 04/14/2021 in [place]  ?Referring Provider [name]  ? ?  ? ? ?Encounter Date: 05/09/2021 ? ?Check In: ? Session Check In - 05/09/21 0930   ? ?  ? Check-In  ? Supervising physician immediately available to respond to emergencies [facility] MD immediately available   ? Physician(s) Dr. Domenic Polite   ? Location [facility]   ? Staff Present [name], BS, Exercise Physiologist;Dalma Panchal Kris Mouton, MS, ACSM-CEP, Exercise Physiologist;[name], RN, BSN   ? Virtual Visit No   ? Medication changes reported     No   ? Fall or balance concerns reported    Yes   ? Tobacco Cessation No Change   ? Warm-up and Cool-down Performed as group-led instruction   ? Resistance Training Performed Yes   ? VAD Patient? No   ? PAD/SET Patient? No   ?  ? Pain Assessment  ? Currently in Pain? No/denies   ? Multiple Pain Sites No   ? ?  ?  ? ?  ? ? ?Capillary Blood Glucose: ?No results found for this or any previous visit (from the past 24 hour(s)). ? ? ? ?Social History  ? ?Tobacco Use  ?Smoking Status Former  ? Packs/day: 1.00  ? Years: 22.00  ? Pack years: 22.00  ? Types: Cigarettes, Cigars  ? Quit date: 06/28/2020  ? Years since quitting: 0.8  ?Smokeless Tobacco Never  ?Tobacco Comments  ? cigarettes stopped in 2020, swisher sweets started in 2018-04-05/day  ? ? ?Goals Met:  ?Independence with exercise equipment ?Exercise tolerated well ?No report of concerns or symptoms today ?Strength training completed today ? ?Goals Unmet:  ?Not Applicable ? ?Comments: checkout time is 1030 ? ? ?[name] is Medical Director for [facility] ?

## 2021-05-11 NOTE — Progress Notes (Unsigned)
S/p ICD Saw Wyatt Donovan 03/13/2021 for HF Thought to be improving in general  Reassess burden of AF with zio

## 2021-05-12 ENCOUNTER — Encounter (HOSPITAL_COMMUNITY)
Admission: RE | Admit: 2021-05-12 | Discharge: 2021-05-12 | Disposition: A | Discharge status: Home / Self Care | Payer: Medicaid Other | Source: Ambulatory Visit | Attending: Cardiology | Admitting: Cardiology

## 2021-05-12 VITALS — Wt 188.1 lb

## 2021-05-12 DIAGNOSIS — I5022 Chronic systolic (congestive) heart failure: Secondary | ICD-10-CM

## 2021-05-12 NOTE — Progress Notes (Signed)
Daily Session Note ? ?Patient Details  ?Name: Wyatt Donovan ?MRN: [id number] ?Date of Birth: 1985-10-18 ?Referring Provider:   ?Flowsheet Row CARDIAC REHAB PHASE II ORIENTATION from 04/14/2021 in [place]  ?Referring Provider [name]  ? ?  ? ? ?Encounter Date: 05/12/2021 ? ?Check In: ? Session Check In - 05/12/21 0930   ? ?  ? Check-In  ? Supervising physician immediately available to respond to emergencies [facility] MD immediately available   ? Physician(s) Johney Frame   ? Location [facility]   ? Staff Present Hoy Register, MS, ACSM-CEP, Exercise Physiologist;Johny Pitstick Wynetta Emery, RN, BSN;Phyllis Billingsley, RN   Madelyn Flavors, RN  ? Virtual Visit No   ? Medication changes reported     No   ? Fall or balance concerns reported    Yes   ? Comments He uses a cane due to his balance, but he has not fallen. He reports getting off balance but not falling.   ? Tobacco Cessation No Change   ? Warm-up and Cool-down Performed as group-led instruction   ? Resistance Training Performed Yes   ? VAD Patient? No   ?  ? Pain Assessment  ? Currently in Pain? No/denies   ? Multiple Pain Sites No   ? ?  ?  ? ?  ? ? ?Capillary Blood Glucose: ?No results found for this or any previous visit (from the past 24 hour(s)). ? ? ? ?Social History  ? ?Tobacco Use  ?Smoking Status Former  ? Packs/day: 1.00  ? Years: 22.00  ? Pack years: 22.00  ? Types: Cigarettes, Cigars  ? Quit date: 06/28/2020  ? Years since quitting: 0.8  ?Smokeless Tobacco Never  ?Tobacco Comments  ? cigarettes stopped in 2020, swisher sweets started in 2018-04-05/day  ? ? ?Goals Met:  ?Independence with exercise equipment ?Exercise tolerated well ?No report of concerns or symptoms today ?Strength training completed today ? ?Goals Unmet:  ?Not Applicable ? ?Comments: Check out 1030. ? ? ?[name] is Medical Director for [place] ?

## 2021-05-13 ENCOUNTER — Ambulatory Visit (INDEPENDENT_AMBULATORY_CARE_PROVIDER_SITE_OTHER): Payer: Medicaid Other

## 2021-05-13 ENCOUNTER — Other Ambulatory Visit: Payer: Self-pay

## 2021-05-13 ENCOUNTER — Ambulatory Visit (INDEPENDENT_AMBULATORY_CARE_PROVIDER_SITE_OTHER): Payer: Medicaid Other | Admitting: Cardiology

## 2021-05-13 ENCOUNTER — Encounter: Payer: Self-pay | Admitting: Cardiology

## 2021-05-13 VITALS — BP 114/70 | HR 55 | Ht 73.0 in | Wt 192.8 lb

## 2021-05-13 DIAGNOSIS — Z951 Presence of aortocoronary bypass graft: Secondary | ICD-10-CM | POA: Diagnosis not present

## 2021-05-13 DIAGNOSIS — I255 Ischemic cardiomyopathy: Secondary | ICD-10-CM

## 2021-05-13 DIAGNOSIS — Z79899 Other long term (current) drug therapy: Secondary | ICD-10-CM

## 2021-05-13 DIAGNOSIS — I5022 Chronic systolic (congestive) heart failure: Secondary | ICD-10-CM

## 2021-05-13 DIAGNOSIS — I4892 Unspecified atrial flutter: Secondary | ICD-10-CM | POA: Diagnosis not present

## 2021-05-13 LAB — CUP PACEART REMOTE DEVICE CHECK
Battery Remaining Longevity: 156 mo
Battery Remaining Percentage: 100 %
Brady Statistic RA Percent Paced: 0 %
Brady Statistic RV Percent Paced: 0 %
Date Time Interrogation Session: 20230314050100
HighPow Impedance: 78 Ohm
Implantable Lead Implant Date: 20221213
Implantable Lead Implant Date: 20221213
Implantable Lead Location: 753859
Implantable Lead Location: 753860
Implantable Lead Model: 673
Implantable Lead Model: 7841
Implantable Lead Serial Number: 1211072
Implantable Lead Serial Number: 160080
Implantable Pulse Generator Implant Date: 20221213
Lead Channel Impedance Value: 495 Ohm
Lead Channel Impedance Value: 552 Ohm
Lead Channel Setting Pacing Amplitude: 3.5 V
Lead Channel Setting Pacing Amplitude: 3.5 V
Lead Channel Setting Pacing Pulse Width: 0.4 ms
Lead Channel Setting Sensing Sensitivity: 0.5 mV
Pulse Gen Serial Number: 617464

## 2021-05-13 NOTE — Patient Instructions (Signed)
Medication Instructions:  ?Your physician recommends that you continue on your current medications as directed. Please refer to the Current Medication list given to you today. ? ?*If you need a refill on your cardiac medications before your next appointment, please call your pharmacy* ? ? ?Lab Work: ?None ?If you have labs (blood work) drawn today and your tests are completely normal, you will receive your results only by: ?MyChart Message (if you have MyChart) OR ?A paper copy in the mail ?If you have any lab test that is abnormal or we need to change your treatment, we will call you to review the results. ? ? ? ?Follow-Up: ?At [facility], you and your health needs are our priority.  As part of our continuing mission to provide you with exceptional heart care, we have created designated Provider Care Teams.  These Care Teams include your primary Cardiologist (physician) and Advanced Practice Providers (APPs -  Physician Assistants and Nurse Practitioners) who all work together to provide you with the care you need, when you need it. ? ?We recommend signing up for the patient portal called "MyChart".  Sign up information is provided on this After Visit Summary.  MyChart is used to connect with patients for Virtual Visits (Telemedicine).  Patients are able to view lab/test results, encounter notes, upcoming appointments, etc.  Non-urgent messages can be sent to your provider as well.   ?To learn more about what you can do with MyChart, go to [email or web address].   ? ?Your next appointment:   ?6 month(s) ? ?The format for your next appointment:   ?In Person ? ?Provider:   ?You will see one of the following Advanced Practice Providers on your designated Care Team:   ?Tommye Standard, PA-C ?Legrand Como "[name]" [place], PA-C  ?

## 2021-05-13 NOTE — Progress Notes (Signed)
?Electrophysiology Office Follow up Visit Note:   ? ?Date:  05/13/2021  ? ?ID:  Wyatt Donovan, DOB 01/25/1986, MRN [id number] ? ?PCP:  Leonie Douglas, MD  ?[facility] Cardiologist:  Loralie Champagne, MD  ?[facility] Electrophysiologist:  [name], MD  ? ? ?Interval History:   ? ?Wyatt Donovan is a 36 y.o. male who presents for a follow up visit. They were last seen in clinic 01/07/2021. ?Since their last appointment, he had a Chemical engineer DDD single coil ICD implanted 02/10/2021. ? ?They followed up with [name] 03/13/2021. At that visit he seemed to be doing well. He reported SOB after walking about 100 yards, but this had been stable for him. EKG showed NSR, 1st degree AVB, old inferior MI, residual anterior STE. He was started on spironolactone 12.5 mg daily and Lokelma 5 g daily given his history of hyperkalemia. His amiodarone 200 mg daily was continued, considered the possibility of atrial fibrillation and flutter ablation to safely stop amiodarone. ? ?Overall, he is feeling pretty good. He has been doing well in cardiac rehab. Now he is able to complete 17 minutes of exercise on the treadmill without rest. When he completes rehab he plans to continue his routines at the [facility] and work towards 4 days a week. ? ?He has been trying to lose weight to reach 180 lbs.  ? ?He denies any palpitations, chest pain, shortness of breath, or peripheral edema. No lightheadedness, headaches, syncope, orthopnea, or PND. ? ? ?  ? ?Past Medical History:  ?Diagnosis Date  ? Atypical chest pain   ? CAD (coronary artery disease)   ? Dyslipidemia   ? Ex-smoker   ? History of depression   ? Insomnia   ? Ischemic cardiomyopathy   ? Ejection fraction 40-45%  ? NSTEMI (non-ST elevated myocardial infarction) ([place]) 08/2007  ? Treated with a bare metal stent to the proximal LAD  ? Suicide attempt [facility]) 01/2001  ? ? ?Past Surgical History:  ?Procedure Laterality Date  ? ADENOIDECTOMY    ? CARDIOVERSION N/A  12/06/2020  ? Procedure: CARDIOVERSION;  Surgeon: Larey Dresser, MD;  Location: [place];  Service: Cardiovascular;  Laterality: N/A;  ? CORONARY ARTERY BYPASS GRAFT N/A 07/03/2020  ? Procedure: CORONARY ARTERY BYPASS GRAFTING (CABG) times four using left internal mammary artery, right arm radial artery and right leg saphenous vein;  Surgeon: Lajuana Matte, MD;  Location: [place];  Service: Open Heart Surgery;  Laterality: N/A;  ? CORONARY STENT INTERVENTION N/A 12/03/2020  ? Procedure: CORONARY STENT INTERVENTION;  Surgeon: Martinique, Peter M, MD;  Location: [place] CV LAB;  Service: Cardiovascular;  Laterality: N/A;  ? CORONARY STENT PLACEMENT    ? Bare metal stent to proximal LAD  ? ICD IMPLANT N/A 02/10/2021  ? Procedure: ICD IMPLANT;  Surgeon: [name], MD;  Location: [place] CV LAB;  Service: Cardiovascular;  Laterality: N/A;  ? LEFT HEART CATH AND CORONARY ANGIOGRAPHY N/A 07/01/2020  ? Procedure: LEFT HEART CATH AND CORONARY ANGIOGRAPHY;  Surgeon: Lorretta Harp, MD;  Location: [place] CV LAB;  Service: Cardiovascular;  Laterality: N/A;  ? LEFT HEART CATH AND CORS/GRAFTS ANGIOGRAPHY N/A 12/03/2020  ? Procedure: LEFT HEART CATH AND CORS/GRAFTS ANGIOGRAPHY;  Surgeon: Martinique, Peter M, MD;  Location: [place] CV LAB;  Service: Cardiovascular;  Laterality: N/A;  ? RADIAL ARTERY HARVEST Right 07/03/2020  ? Procedure: RADIAL ARTERY HARVEST;  Surgeon: Lajuana Matte, MD;  Location: [place];  Service:  Open Heart Surgery;  Laterality: Right;  ? TEE WITHOUT CARDIOVERSION N/A 07/03/2020  ? Procedure: TRANSESOPHAGEAL ECHOCARDIOGRAM (TEE);  Surgeon: Lajuana Matte, MD;  Location: [place];  Service: Open Heart Surgery;  Laterality: N/A;  ? ? ?Current Medications: ?Current Meds  ?Medication Sig  ? amiodarone (PACERONE) 200 MG tablet Take 1 tablet (200 mg total) by mouth daily.  ? apixaban (ELIQUIS) 5 MG TABS tablet Take 1 tablet (5 mg total) by mouth 2 (two) times daily.  ? atorvastatin  (LIPITOR) 80 MG tablet Take 1 tablet (80 mg total) by mouth daily.  ? carvedilol (COREG) 3.125 MG tablet Take 1 tablet (3.125 mg total) by mouth 2 (two) times daily with a meal.  ? clopidogrel (PLAVIX) 75 MG tablet Take 1 tablet (75 mg total) by mouth daily.  ? dapagliflozin propanediol (FARXIGA) 10 MG TABS tablet Take 1 tablet (10 mg total) by mouth daily before breakfast.  ? hydrOXYzine (ATARAX) 10 MG tablet Take 10 mg by mouth at bedtime.  ? isosorbide mononitrate (ISMO) 10 MG tablet 1 tablet  ? losartan (COZAAR) 25 MG tablet Take 0.5 tablets (12.5 mg total) by mouth daily.  ? nitroGLYCERIN (NITROSTAT) 0.4 MG SL tablet Place 0.4 mg under the tongue every 5 (five) minutes x 3 doses as needed for chest pain.  ? sodium zirconium cyclosilicate (LOKELMA) 5 g packet Take 5 g by mouth daily.  ? spironolactone (ALDACTONE) 25 MG tablet Take 1 tablet (25 mg total) by mouth daily.  ? sulfamethoxazole-trimethoprim (BACTRIM DS) 800-160 MG tablet Take 1 tablet by mouth 2 (two) times daily.  ? [DISCONTINUED] hydrOXYzine (ATARAX) 10 MG tablet 1 tablet at bedtime as needed  ? [DISCONTINUED] sulfamethoxazole-trimethoprim (BACTRIM DS) 800-160 MG tablet 1 tablet  ?  ? ?Allergies:   Codeine  ? ?Social History  ? ?Socioeconomic History  ? Marital status: Single  ?  Spouse name: Not on file  ? Number of children: Not on file  ? Years of education: Not on file  ? Highest education level: High school graduate  ?Occupational History  ? Occupation: Not actively working  ?  Comment: applied for disability x 2.   ?Tobacco Use  ? Smoking status: Former  ?  Packs/day: 1.00  ?  Years: 22.00  ?  Pack years: 22.00  ?  Types: Cigarettes, Cigars  ?  Quit date: 06/28/2020  ?  Years since quitting: 0.8  ? Smokeless tobacco: Never  ? Tobacco comments:  ?  cigarettes stopped in 2020, swisher sweets started in 2018-04-05/day  ?Vaping Use  ? Vaping Use: Never used  ?Substance and Sexual Activity  ? Alcohol use: Never  ? Drug use: Not Currently  ?  Types:  Marijuana  ? Sexual activity: Never  ?Other Topics Concern  ? Not on file  ?Social History Narrative  ? Single  ? Lives with his parents  ? Trying to get his GED  ? DeGent date all cultures  ? ?Social Determinants of Health  ? ?Financial Resource Strain: Medium Risk  ? Difficulty of Paying Living Expenses: Somewhat hard  ?Food Insecurity: No Food Insecurity  ? Worried About Charity fundraiser in the Last Year: Never true  ? Ran Out of Food in the Last Year: Never true  ?Transportation Needs: No Transportation Needs  ? Lack of Transportation (Medical): No  ? Lack of Transportation (Non-Medical): No  ?Physical Activity: Not on file  ?Stress: Not on file  ?Social Connections: Not on file  ?  ? ?Family  History: ?The patient's family history is negative for Hypertension, Diabetes, and Coronary artery disease. ? ?ROS:   ?Please see the history of present illness.    ? ?All other systems reviewed and are negative. ? ?EKGs/Labs/Other Studies Reviewed:   ? ?The following studies were reviewed today: ? ?In-Clinic ICD Device Interrogation 05/13/2021: ?Battery longevity 13 years ?Lead parameter stable ?No therapies delivered ? ? ?ICD Implant 02/10/2021: ? 1. Ischemic cardiomyopathy with chronic New York Heart Association class III heart failure.  ? 2. Successful ICD implantation with a [employer] dual chamber single coil ICD implanted for primary prevention of sudden death.  ? 3.  No early apparent complications.  ? ?12/05/2020 Cardiac MRI ?IMPRESSION: ?1. Subendocardial late gadolinium enhancement consistent with prior ?infarct in basal to mid ?inferior/inferoseptal/inferolateral/anterolateral walls, apical ?lateral/septal walls, and apex. LGE is greater than 50% transmural ?suggesting nonviability in basal ?inferior/inferoseptal/inferolateral/anterolateral walls, mid ?inferior/inferolateral/anterolateral walls, apical lateral wall, and ?distal apex. ?2.  Normal LV size with severe systolic dysfunction (EF 03%) ?3.   Small RV size with mild systolic dysfunction (EF 50%) ?4.  No LV thrombus seen ? ?Echo 12/04/2020: ? 1. No obvious apical thrombus in the region previously reported, however,  ?this was a non-contrast study - if clinic

## 2021-05-14 ENCOUNTER — Encounter (HOSPITAL_COMMUNITY)
Admission: RE | Admit: 2021-05-14 | Discharge: 2021-05-14 | Disposition: A | Discharge status: Home / Self Care | Payer: Medicaid Other | Source: Ambulatory Visit | Attending: Cardiology | Admitting: Cardiology

## 2021-05-14 DIAGNOSIS — I5022 Chronic systolic (congestive) heart failure: Secondary | ICD-10-CM

## 2021-05-14 NOTE — Progress Notes (Signed)
Daily Session Note ? ?Patient Details  ?Name: Wyatt Donovan ?MRN: [id number] ?Date of Birth: 07/19/1985 ?Referring Provider:   ?Flowsheet Row CARDIAC REHAB PHASE II ORIENTATION from 04/14/2021 in [place]  ?Referring Provider [name]  ? ?  ? ? ?Encounter Date: 05/14/2021 ? ?Check In: ? Session Check In - 05/14/21 0930   ? ?  ? Check-In  ? Supervising physician immediately available to respond to emergencies [facility] MD immediately available   ? Physician(s) [name]   ? Location [facility]   ? Staff Present Hoy Register, MS, ACSM-CEP, Exercise Physiologist;Heather Otho Ket, BS, Exercise Physiologist;[name], RN, BSN   Osie Merkin Hassell Done, BSN  ? Virtual Visit No   ? Medication changes reported     No   ? Fall or balance concerns reported    Yes   ? Comments He uses a cane due to his balance, but he has not fallen. He reports getting off balance but not falling.   ? Tobacco Cessation No Change   ? Warm-up and Cool-down Performed as group-led instruction   ? Resistance Training Performed Yes   ? VAD Patient? No   ? PAD/SET Patient? No   ?  ? Pain Assessment  ? Currently in Pain? No/denies   ? Multiple Pain Sites No   ? ?  ?  ? ?  ? ? ?Capillary Blood Glucose: ?No results found for this or any previous visit (from the past 24 hour(s)). ? ? ? ?Social History  ? ?Tobacco Use  ?Smoking Status Former  ? Packs/day: 1.00  ? Years: 22.00  ? Pack years: 22.00  ? Types: Cigarettes, Cigars  ? Quit date: 06/28/2020  ? Years since quitting: 0.8  ?Smokeless Tobacco Never  ?Tobacco Comments  ? cigarettes stopped in 2020, swisher sweets started in 2018-04-05/day  ? ? ?Goals Met:  ?Independence with exercise equipment ?Exercise tolerated well ?No report of concerns or symptoms today ?Strength training completed today ? ?Goals Unmet:  ?Not Applicable ? ?Comments: Check out at 1030 ? ? ? ?[name] is Medical Director for [place] ?

## 2021-05-16 ENCOUNTER — Encounter (HOSPITAL_COMMUNITY)
Admission: RE | Admit: 2021-05-16 | Discharge: 2021-05-16 | Disposition: A | Discharge status: Home / Self Care | Payer: Medicaid Other | Source: Ambulatory Visit | Attending: Cardiology | Admitting: Cardiology

## 2021-05-16 DIAGNOSIS — I5022 Chronic systolic (congestive) heart failure: Secondary | ICD-10-CM | POA: Diagnosis not present

## 2021-05-16 NOTE — Progress Notes (Signed)
Daily Session Note ? ?Patient Details  ?Name: Wyatt Donovan ?MRN: [id number] ?Date of Birth: Jul 25, 1985 ?Referring Provider:   ?Flowsheet Row CARDIAC REHAB PHASE II ORIENTATION from 04/14/2021 in [place]  ?Referring Provider [name]  ? ?  ? ? ?Encounter Date: 05/16/2021 ? ?Check In: ? Session Check In - 05/16/21 0932   ? ?  ? Check-In  ? Supervising physician immediately available to respond to emergencies [facility] MD immediately available   ? Physician(s) [name]   ? Location [facility]   ? Staff Present Geanie Cooley, RN;[name], RN, [name], RN, [name], RN, BSN   ? Virtual Visit No   ? Medication changes reported     No   ? Fall or balance concerns reported    Yes   ? Comments He uses a cane due to his balance, but he has not fallen. He reports getting off balance but not falling.   ? Tobacco Cessation No Change   ? Warm-up and Cool-down Performed as group-led instruction   ? Resistance Training Performed Yes   ? VAD Patient? No   ? PAD/SET Patient? No   ?  ? Pain Assessment  ? Currently in Pain? No/denies   ? Multiple Pain Sites No   ? ?  ?  ? ?  ? ? ?Capillary Blood Glucose: ?No results found for this or any previous visit (from the past 24 hour(s)). ? ? ? ?Social History  ? ?Tobacco Use  ?Smoking Status Former  ? Packs/day: 1.00  ? Years: 22.00  ? Pack years: 22.00  ? Types: Cigarettes, Cigars  ? Quit date: 06/28/2020  ? Years since quitting: 0.8  ?Smokeless Tobacco Never  ?Tobacco Comments  ? cigarettes stopped in 2020, swisher sweets started in 2018-04-05/day  ? ? ?Goals Met:  ?Independence with exercise equipment ?Personal goals reviewed ?No report of concerns or symptoms today ?Strength training completed today ? ?Goals Unmet:  ?Not Applicable ? ?Comments: checkout 1030 ? ? ? ?[name] is Medical Director for [place] ?

## 2021-05-19 ENCOUNTER — Encounter (HOSPITAL_COMMUNITY)
Admission: RE | Admit: 2021-05-19 | Discharge: 2021-05-19 | Disposition: A | Discharge status: Home / Self Care | Payer: Medicaid Other | Source: Ambulatory Visit | Attending: Cardiology | Admitting: Cardiology

## 2021-05-19 DIAGNOSIS — I5022 Chronic systolic (congestive) heart failure: Secondary | ICD-10-CM | POA: Diagnosis not present

## 2021-05-19 NOTE — Progress Notes (Signed)
Daily Session Note ? ?Patient Details  ?Name: Wyatt Donovan ?MRN: [id number] ?Date of Birth: December 05, 1985 ?Referring Provider:   ?Flowsheet Row CARDIAC REHAB PHASE II ORIENTATION from 04/14/2021 in [place]  ?Referring Provider Dr. Aundra Donovan  ? ?  ? ? ?Encounter Date: 05/19/2021 ? ?Check In: ? Session Check In - 05/19/21 0936   ? ?  ? Check-In  ? Supervising physician immediately available to respond to emergencies [facility] MD immediately available   ? Physician(s) Dr Wyatt Donovan   ? Location [facility]   ? Staff Present Wyatt Donovan, BS, Exercise Physiologist;[name], RN, [name], RN, [name], RN, BSN   ? Virtual Visit No   ? Medication changes reported     No   ? Fall or balance concerns reported    Yes   ? Comments He uses a cane due to his balance, but he has not fallen. He reports getting off balance but not falling.   ? Tobacco Cessation No Change   ? Warm-up and Cool-down Performed as group-led instruction   ? Resistance Training Performed Yes   ? VAD Patient? No   ? PAD/SET Patient? No   ?  ? Pain Assessment  ? Currently in Pain? No/denies   ? Multiple Pain Sites No   ? ?  ?  ? ?  ? ? ?Capillary Blood Glucose: ?No results found for this or any previous visit (from the past 24 hour(s)). ? ? ? ?Social History  ? ?Tobacco Use  ?Smoking Status Former  ? Packs/day: 1.00  ? Years: 22.00  ? Pack years: 22.00  ? Types: Cigarettes, Cigars  ? Quit date: 06/28/2020  ? Years since quitting: 0.8  ?Smokeless Tobacco Never  ?Tobacco Comments  ? cigarettes stopped in 2020, swisher sweets started in 2018-04-05/day  ? ? ?Goals Met:  ?Independence with exercise equipment ?Exercise tolerated well ?No report of concerns or symptoms today ?Strength training completed today ? ?Goals Unmet:  ?Not Applicable ? ?Comments: checkout at 1030. ? ? ?[name] is Medical Director for [place] ?

## 2021-05-21 ENCOUNTER — Other Ambulatory Visit: Payer: Self-pay

## 2021-05-21 ENCOUNTER — Telehealth (HOSPITAL_COMMUNITY): Payer: Self-pay | Admitting: Pharmacy Technician

## 2021-05-21 ENCOUNTER — Other Ambulatory Visit (HOSPITAL_COMMUNITY): Payer: Self-pay

## 2021-05-21 ENCOUNTER — Ambulatory Visit (HOSPITAL_COMMUNITY)
Admission: RE | Admit: 2021-05-21 | Discharge: 2021-05-21 | Disposition: A | Discharge status: Home / Self Care | Payer: Medicaid Other | Source: Ambulatory Visit | Attending: Cardiology | Admitting: Cardiology

## 2021-05-21 ENCOUNTER — Encounter (HOSPITAL_COMMUNITY): Payer: Medicaid Other

## 2021-05-21 ENCOUNTER — Encounter (HOSPITAL_COMMUNITY): Payer: Self-pay | Admitting: Cardiology

## 2021-05-21 VITALS — BP 102/70 | HR 60 | Wt 193.2 lb

## 2021-05-21 DIAGNOSIS — I252 Old myocardial infarction: Secondary | ICD-10-CM | POA: Insufficient documentation

## 2021-05-21 DIAGNOSIS — I2510 Atherosclerotic heart disease of native coronary artery without angina pectoris: Secondary | ICD-10-CM | POA: Insufficient documentation

## 2021-05-21 DIAGNOSIS — I480 Paroxysmal atrial fibrillation: Secondary | ICD-10-CM | POA: Diagnosis not present

## 2021-05-21 DIAGNOSIS — Z79899 Other long term (current) drug therapy: Secondary | ICD-10-CM | POA: Insufficient documentation

## 2021-05-21 DIAGNOSIS — Z7901 Long term (current) use of anticoagulants: Secondary | ICD-10-CM | POA: Diagnosis not present

## 2021-05-21 DIAGNOSIS — Z955 Presence of coronary angioplasty implant and graft: Secondary | ICD-10-CM | POA: Insufficient documentation

## 2021-05-21 DIAGNOSIS — I5022 Chronic systolic (congestive) heart failure: Secondary | ICD-10-CM | POA: Insufficient documentation

## 2021-05-21 DIAGNOSIS — E875 Hyperkalemia: Secondary | ICD-10-CM | POA: Diagnosis not present

## 2021-05-21 DIAGNOSIS — I255 Ischemic cardiomyopathy: Secondary | ICD-10-CM | POA: Diagnosis not present

## 2021-05-21 DIAGNOSIS — Z8719 Personal history of other diseases of the digestive system: Secondary | ICD-10-CM

## 2021-05-21 DIAGNOSIS — I513 Intracardiac thrombosis, not elsewhere classified: Secondary | ICD-10-CM | POA: Insufficient documentation

## 2021-05-21 DIAGNOSIS — I4892 Unspecified atrial flutter: Secondary | ICD-10-CM | POA: Diagnosis not present

## 2021-05-21 DIAGNOSIS — Z951 Presence of aortocoronary bypass graft: Secondary | ICD-10-CM | POA: Diagnosis not present

## 2021-05-21 DIAGNOSIS — Z87891 Personal history of nicotine dependence: Secondary | ICD-10-CM | POA: Diagnosis not present

## 2021-05-21 DIAGNOSIS — Z7902 Long term (current) use of antithrombotics/antiplatelets: Secondary | ICD-10-CM | POA: Diagnosis not present

## 2021-05-21 LAB — CBC
HCT: 48.5 % (ref 39.0–52.0)
Hemoglobin: 15.9 g/dL (ref 13.0–17.0)
MCH: 31.3 pg (ref 26.0–34.0)
MCHC: 32.8 g/dL (ref 30.0–36.0)
MCV: 95.5 fL (ref 80.0–100.0)
Platelets: 187 10*3/uL (ref 150–400)
RBC: 5.08 MIL/uL (ref 4.22–5.81)
RDW: 14 % (ref 11.5–15.5)
WBC: 7.4 10*3/uL (ref 4.0–10.5)
nRBC: 0 % (ref 0.0–0.2)

## 2021-05-21 LAB — COMPREHENSIVE METABOLIC PANEL
ALT: 69 U/L — ABNORMAL HIGH (ref 0–44)
AST: 37 U/L (ref 15–41)
Albumin: 3.4 g/dL — ABNORMAL LOW (ref 3.5–5.0)
Alkaline Phosphatase: 57 U/L (ref 38–126)
Anion gap: 8 (ref 5–15)
BUN: 19 mg/dL (ref 6–20)
CO2: 20 mmol/L — ABNORMAL LOW (ref 22–32)
Calcium: 9 mg/dL (ref 8.9–10.3)
Chloride: 113 mmol/L — ABNORMAL HIGH (ref 98–111)
Creatinine, Ser: 0.95 mg/dL (ref 0.61–1.24)
GFR, Estimated: >60 mL/min (ref >60)
Glucose, Bld: 94 mg/dL (ref 70–99)
Potassium: 4.2 mmol/L (ref 3.5–5.1)
Sodium: 141 mmol/L (ref 135–145)
Total Bilirubin: 0.8 mg/dL (ref 0.3–1.2)
Total Protein: 6.3 g/dL — ABNORMAL LOW (ref 6.5–8.1)

## 2021-05-21 LAB — TSH: TSH: 1.067 u[IU]/mL (ref 0.350–4.500)

## 2021-05-21 MED ORDER — ENTRESTO 24-26 MG PO TABS
1.0000 | ORAL_TABLET | Freq: Two times a day (BID) | ORAL | 3 refills | Status: DC
Start: 2021-05-21 — End: 2021-08-26

## 2021-05-21 NOTE — Patient Instructions (Signed)
Medication Changes: ? ?Start Entresto 24/26 (Call your Pharmacy tomorrow and you should be able to get it filled then) ? ?Stop Losartan ? ?Start Colace (get it over the counter) ? ?Lab Work: ? ?Labs done today, your results will be available in MyChart, we will contact you for abnormal readings. ? ? ?Testing/Procedures: ? ?Repeat blood work in 10 days  ? ?Referrals: ? ?You have been referred to the Gastroenterology Doctor. They will call you to arrange an appointment. ? ?Please follow up with our heart failure pharmacist in 3 weeks  ? ?Special Instructions // Education: ? ?none ? ?Follow-Up in: 6 weeks ? ?At the [facility], you and your health needs are our priority. We have a designated team specialized in the treatment of Heart Failure. This Care Team includes your primary Heart Failure Specialized Cardiologist (physician), Advanced Practice Providers (APPs- Physician Assistants and Nurse Practitioners), and Pharmacist who all work together to provide you with the care you need, when you need it.  ? ?You may see any of the following providers on your designated Care Team at your next follow up: ? ?Dr Glori Bickers ?Dr Loralie Champagne ?Darrick Grinder, NP ?Lyda Jester, PA ?Jessica Milford,NP ?Marlyce Huge, PA ?Audry Riles, PharmD ? ? ?Please be sure to bring in all your medications bottles to every appointment.  ? ?Need to Contact us: ? ?If you have any questions or concerns before your next appointment please send [place] a message through [place] or call our office at [phone].   ? ?TO LEAVE A MESSAGE FOR THE NURSE SELECT OPTION 2, PLEASE LEAVE A MESSAGE INCLUDING: ?YOUR NAME ?DATE OF BIRTH ?CALL BACK NUMBER ?REASON FOR CALL**this is important as we prioritize the call backs ? ?YOU WILL RECEIVE A CALL BACK THE SAME DAY AS LONG AS YOU CALL BEFORE 4:00 PM ? ? ?

## 2021-05-21 NOTE — Progress Notes (Signed)
? ?Advanced Heart Failure Clinic Note ? ? ?Primary Care: Leonie Douglas, MD ?HF Cardiologist: [name] ?EP: Dr Quentin Ore  ? ?HPI: ?Patient is a 36 y.o. with history of early onset CAD s/p CABG, ischemic cardiomyopathy, and LV thrombus. Patient had initial MI in 2012 at age 72, PCI to LAD.  He had inferoposterior MI in 4/22.  LHC showed 3 vessel disease, and patient had CABG x 4. Cardiac MRI in 5/22 showed LV EF 27% with LV thrombus, there was significant viability.  Post-op, patient had GI bleeding and anticoagulation was stopped.  He quit smoking after CABG.  ?  ?Follow up 10/04/20 carvedilol decreased due to dizziness and repeat echo arranged.  Echo 8/22 EF 25-30%. He was referred to EP for ICD consideration. ?  ?Admitted 12/03/20 with NSTEMI. Coronary angiogram showed patent LIMA to LAD with occlusion of all other grafts. S/p PCI/DES to native mid LCX 99% stenosis.  Native LAD and RCA occluded. Angina resolved post PCI. Farxiga and Coreg added back, however spiro and losartan held due to hyperkalemia during this admission. His hospitalization was complicated by atrial fibrillation and flutter with RVR. He received IV amiodarone and started on Eliquis.  He underwent DCCV with conversion to NSR on 12/06/20. Discharged home 12/06/20, weight 177.5 lbs. ? ?He returns for followup of CHF.  He is not smoking or drinking ETOH.  He is now going cardiac rehab.  Slowly getting stronger.  Still uses cane for stability.  He denies dyspnea walking on flat ground though he tires relatively easily.  No chest pain.  No lightheadedness.  No BRBPR/melena.  He is constipated, this concerns him for colon cancer (father with colon cancer at 77, several other relatives with early colon cancer). Weight is up 2 lbs.  ? ?ECG (personally reviewed): NSR, old inferior MI, residual anterior STE ? ?[employer] device interrogation: Heartlogic 10 ? ?Labs (10/22): K 4.5, creatinine 0.95, LDL 45 ?Labs (3/23): K 4.2, creatinine  0.94 ? ?PMH: ?1. Hyperlipidemia ?2. CAD: MI at age 39 in 2012, PCI to LAD.   ?- Inferoposterior MI in 4/22: LHC showed 3 vessel disease.  CABG with LIMA-LAD, radial to OM1, sequential SVG-PDA and D1.  ?- NSTEMI-->LHC (10/22): patent LIMA to LAD, all other grafts occluded. S/p PCI DES native mid LCx 99% stenosis, LAD and RCA totally occluded.  ?3. Post-operative GI bleeding after CABG ?4. Chronic systolic CHF: Ischemic cardiomyopathy.   ?- Cardiac MRI (5/22): LV EF 27%, RV EF 57%, apical thrombus, extensive viability.  ?- Echo (8/22) EF 20-25% ?- Echo (10/22): EF 35-40% ?- cMRI (10/22): LVEF 29%, RV EF 45%, no LV thrombus, LGE suggestive of prior MI.  ?- [employer] ICD  ?5. LV thrombus  ?- No thrombus on 10/22 cMRI.  ?6. Prior smoker: Quit 5/22.  ?7. H/o hyperkalemia ?8. Atrial fibrillation/flutter: Paroxysmal.  ?- DCCV 10/22 ?9. ABIs (5/22): Normal.  ? ?Current Outpatient Medications  ?Medication Sig Dispense Refill  ? amiodarone (PACERONE) 200 MG tablet Take 1 tablet (200 mg total) by mouth daily. 30 tablet 6  ? apixaban (ELIQUIS) 5 MG TABS tablet Take 1 tablet (5 mg total) by mouth 2 (two) times daily. 60 tablet 6  ? atorvastatin (LIPITOR) 80 MG tablet Take 1 tablet (80 mg total) by mouth daily. 30 tablet 11  ? carvedilol (COREG) 3.125 MG tablet Take 1 tablet (3.125 mg total) by mouth 2 (two) times daily with a meal. 180 tablet 3  ? clopidogrel (PLAVIX) 75 MG tablet Take 1 tablet (  75 mg total) by mouth daily. 30 tablet 6  ? dapagliflozin propanediol (FARXIGA) 10 MG TABS tablet Take 1 tablet (10 mg total) by mouth daily before breakfast. 30 tablet 11  ? hydrOXYzine (ATARAX) 10 MG tablet Take 10 mg by mouth at bedtime.    ? isosorbide mononitrate (ISMO) 10 MG tablet 1 tablet    ? nitroGLYCERIN (NITROSTAT) 0.4 MG SL tablet Place 0.4 mg under the tongue every 5 (five) minutes x 3 doses as needed for chest pain.    ? sacubitril-valsartan (ENTRESTO) 24-26 MG Take 1 tablet by mouth 2 (two) times daily. 180 tablet  3  ? sodium zirconium cyclosilicate (LOKELMA) 5 g packet Take 5 g by mouth daily. 30 packet 6  ? spironolactone (ALDACTONE) 25 MG tablet Take 1 tablet (25 mg total) by mouth daily. 90 tablet 3  ? ?No current facility-administered medications for this encounter.  ? ?Allergies  ?Allergen Reactions  ? Codeine Itching  ? ?Social History  ? ?Socioeconomic History  ? Marital status: Single  ?  Spouse name: Not on file  ? Number of children: Not on file  ? Years of education: Not on file  ? Highest education level: High school graduate  ?Occupational History  ? Occupation: Not actively working  ?  Comment: applied for disability x 2.   ?Tobacco Use  ? Smoking status: Former  ?  Packs/day: 1.00  ?  Years: 22.00  ?  Pack years: 22.00  ?  Types: Cigarettes, Cigars  ?  Quit date: 06/28/2020  ?  Years since quitting: 0.8  ? Smokeless tobacco: Never  ? Tobacco comments:  ?  cigarettes stopped in 2020, swisher sweets started in 2018-04-05/day  ?Vaping Use  ? Vaping Use: Never used  ?Substance and Sexual Activity  ? Alcohol use: Never  ? Drug use: Not Currently  ?  Types: Marijuana  ? Sexual activity: Never  ?Other Topics Concern  ? Not on file  ?Social History Narrative  ? Single  ? Lives with his parents  ? Trying to get his GED  ? DeGent date all cultures  ? ?Social Determinants of Health  ? ?Financial Resource Strain: Medium Risk  ? Difficulty of Paying Living Expenses: Somewhat hard  ?Food Insecurity: No Food Insecurity  ? Worried About Charity fundraiser in the Last Year: Never true  ? Ran Out of Food in the Last Year: Never true  ?Transportation Needs: No Transportation Needs  ? Lack of Transportation (Medical): No  ? Lack of Transportation (Non-Medical): No  ?Physical Activity: Not on file  ?Stress: Not on file  ?Social Connections: Not on file  ?Intimate Partner Violence: Not on file  ? ?Family History  ?Problem Relation Age of Onset  ? Hypertension Neg Hx   ? Diabetes Neg Hx   ? Coronary artery disease Neg Hx   ? ?BP  102/70   Pulse 60   Wt 87.6 kg (193 lb 3.2 oz)   SpO2 99%   BMI 25.49 kg/m?  ? ?Wt Readings from Last 3 Encounters:  ?05/21/21 87.6 kg (193 lb 3.2 oz)  ?05/13/21 87.5 kg (192 lb 12.8 oz)  ?05/12/21 85.3 kg (188 lb 0.8 oz)  ? ?General: NAD ?Neck: No JVD, no thyromegaly or thyroid nodule.  ?Lungs: Clear to auscultation bilaterally with normal respiratory effort. ?CV: Nondisplaced PMI.  Heart regular S1/S2, no S3/S4, no murmur.  No peripheral edema.  No carotid bruit.  Normal pedal pulses.  ?Abdomen: Soft, nontender, no hepatosplenomegaly, no  distention.  ?Skin: Intact without lesions or rashes.  ?Neurologic: Alert and oriented x 3.  ?Psych: Normal affect. ?Extremities: No clubbing or cyanosis.  ?HEENT: Normal.  ? ?ASSESSMENT & PLAN:  ?1. CAD: early onset CAD s/p MI with LAD PCI in 2012,followed by CABG  x 4 4/22  Admit 10/22 with NSTEMI. LHC this admission with patent LIMA to LAD, all other grafts occluded. S/p PCI DES native mid Lcx 99% stenosis. No chest pain.  ?- Continue Plavix at least 6 months, ideally 1 year.  ?- He is on Eliquis so no ASA.   ?- Continue atorvastatin 80 mg, good lipids in 10/22.  ?2. Chronic Systolic CHF: Ischemic cardiomyopathy.  [place].  Most recent study was a cardiac MRI in 10/22 with LVEF 29% and RVEF 45%.  NYHA class II symptoms, he is not volume overloaded on exam or by Heartlogic. GDMT has been limited by hypotension & hyperkalemia, K is now controlled with Lokelma.   ?- Continue Coreg 3.125 mg bid. ?- Continue Farxiga 10 mg daily.  ?- Continue spironolactone 25 mg daily + Lokelma 5 g daily given history of hyperkalemia.   ?- Stop losartan, start Entresto 24/26 bid.  BMET today and again in 10 days.  ?- Continue cardiac rehab at [facility].  ?3. 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 Anticoagulation previously stopped d/t GI bleed. No thrombus on echo 08/22. No thrombus on cMRI 12/06/20. ?- He is now on Eliquis 5 mg twice a day ?4. Hyperkalemia: He has had a tendency  towards hyperkalemia.  I think that he will need Lokelma in order to take spironolactone and Entresto.  See above.  ?5. Atrial flutter/fibrillation: Both arrhythmias noted in 10/22.  He tolerated poorly with worsened symptoms.  H

## 2021-05-21 NOTE — Telephone Encounter (Signed)
Advanced Heart Failure Patient Advocate Encounter ? ?Prior Authorization for Delene Loll has been approved.   ? ?PA# [id number] ?Effective dates: 05/07/21 through 05/21/22 ? ?Patients co-pay is $0 (90 days) ? ?Charlann Boxer, CPhT ? ? ?

## 2021-05-21 NOTE — Telephone Encounter (Signed)
Patient Advocate Encounter ?  ?Received notification from [facility] that prior authorization for Wyatt Donovan is required. ?  ?PA submitted on CoverMyMeds ?Key  [id number]  ?Status is pending ?  ?Will continue to follow. ? ?

## 2021-05-23 ENCOUNTER — Encounter (HOSPITAL_COMMUNITY): Payer: Medicaid Other

## 2021-05-26 ENCOUNTER — Encounter (HOSPITAL_COMMUNITY)
Admission: RE | Admit: 2021-05-26 | Discharge: 2021-05-26 | Disposition: A | Discharge status: Home / Self Care | Payer: Medicaid Other | Source: Ambulatory Visit | Attending: Cardiology | Admitting: Cardiology

## 2021-05-26 VITALS — Wt 186.3 lb

## 2021-05-26 DIAGNOSIS — I5022 Chronic systolic (congestive) heart failure: Secondary | ICD-10-CM

## 2021-05-26 NOTE — Progress Notes (Signed)
Daily Session Note ? ?Patient Details  ?Name: Wyatt Donovan ?MRN: [id number] ?Date of Birth: 1985/12/03 ?Referring Provider:   ?Flowsheet Row CARDIAC REHAB PHASE II ORIENTATION from 04/14/2021 in [place]  ?Referring Provider [name]  ? ?  ? ? ?Encounter Date: 05/26/2021 ? ?Check In: ? Session Check In - 05/26/21 0930   ? ?  ? Check-In  ? Supervising physician immediately available to respond to emergencies [facility] MD immediately available   ? Physician(s) Dr Marlou Porch   ? Location [facility]   ? Staff Present [name], BS, Exercise Physiologist;Estevan Kersh Hassell Done, RN, Bjorn Loser, MS, ACSM-CEP, Exercise Physiologist   ? Virtual Visit No   ? Medication changes reported     Yes   ? Comments Started 05/22/2021 Entresto 21m-26mg po BID daily   ? Fall or balance concerns reported    Yes   ? Comments He uses a cane due to his balance, but he has not fallen. He reports getting off balance but not falling.   ? Tobacco Cessation No Change   ? Warm-up and Cool-down Performed as group-led instruction   ? Resistance Training Performed Yes   ? VAD Patient? No   ? PAD/SET Patient? No   ?  ? Pain Assessment  ? Currently in Pain? No/denies   ? Multiple Pain Sites No   ? ?  ?  ? ?  ? ? ?Capillary Blood Glucose: ?No results found for this or any previous visit (from the past 24 hour(s)). ? ? ? ?Social History  ? ?Tobacco Use  ?Smoking Status Former  ? Packs/day: 1.00  ? Years: 22.00  ? Pack years: 22.00  ? Types: Cigarettes, Cigars  ? Quit date: 06/28/2020  ? Years since quitting: 0.9  ?Smokeless Tobacco Never  ?Tobacco Comments  ? cigarettes stopped in 2020, swisher sweets started in 2018-04-05/day  ? ? ?Goals Met:  ?Independence with exercise equipment ?Exercise tolerated well ?No report of concerns or symptoms today ?Strength training completed today ? ?Goals Unmet:  ?Not Applicable ? ?Comments: Check out at 1030. ? ? ?Dr. JCarlyle Dollyis Medical Director for [place]?

## 2021-05-27 NOTE — Progress Notes (Signed)
Remote ICD transmission.   

## 2021-05-28 ENCOUNTER — Encounter (HOSPITAL_COMMUNITY)
Admission: RE | Admit: 2021-05-28 | Discharge: 2021-05-28 | Disposition: A | Discharge status: Home / Self Care | Payer: Medicaid Other | Source: Ambulatory Visit | Attending: Cardiology | Admitting: Cardiology

## 2021-05-28 DIAGNOSIS — I5022 Chronic systolic (congestive) heart failure: Secondary | ICD-10-CM | POA: Diagnosis not present

## 2021-05-28 NOTE — Progress Notes (Signed)
Daily Session Note ? ?Patient Details  ?Name: Wyatt Donovan ?MRN: [id number] ?Date of Birth: 06-04-1985 ?Referring Provider:   ?Flowsheet Row CARDIAC REHAB PHASE II ORIENTATION from 04/14/2021 in [place]  ?Referring Provider [name]  ? ?  ? ? ?Encounter Date: 05/28/2021 ? ?Check In: ? Session Check In - 05/28/21 0930   ? ?  ? Check-In  ? Supervising physician immediately available to respond to emergencies [facility] MD immediately available   ? Physician(s) [name]   ? Location [facility]   ? Staff Present [name], BS, Exercise Physiologist;Ashaunte Standley Hassell Done, RN, [name], RN, BSN   ? Virtual Visit No   ? Medication changes reported     Yes   ? Comments Started 05/22/2021 Entresto 83m-26mg po BID daily   ? Fall or balance concerns reported    Yes   ? Comments He uses a cane due to his balance, but he has not fallen. He reports getting off balance but not falling.   ? Tobacco Cessation No Change   ? Warm-up and Cool-down Performed as group-led instruction   ? Resistance Training Performed Yes   ? VAD Patient? No   ? PAD/SET Patient? No   ?  ? Pain Assessment  ? Currently in Pain? No/denies   ? Multiple Pain Sites No   ? ?  ?  ? ?  ? ? ?Capillary Blood Glucose: ?No results found for this or any previous visit (from the past 24 hour(s)). ? ? ? ?Social History  ? ?Tobacco Use  ?Smoking Status Former  ? Packs/day: 1.00  ? Years: 22.00  ? Pack years: 22.00  ? Types: Cigarettes, Cigars  ? Quit date: 06/28/2020  ? Years since quitting: 0.9  ?Smokeless Tobacco Never  ?Tobacco Comments  ? cigarettes stopped in 2020, swisher sweets started in 2018-04-05/day  ? ? ?Goals Met:  ?Independence with exercise equipment ?Exercise tolerated well ?No report of concerns or symptoms today ?Strength training completed today ? ?Goals Unmet:  ?Not Applicable ? ?Comments: Checkout at 1030. ? ? ?Dr. JCarlyle Dollyis Medical Director for [place]?

## 2021-05-28 NOTE — Progress Notes (Signed)
Cardiac Individual Treatment Plan ? ?Patient Details  ?Name: Wyatt Donovan ?MRN: [id number] ?Date of Birth: [birth year] ?Referring Provider:   ?Flowsheet Row CARDIAC REHAB PHASE II ORIENTATION from 04/14/2021 in [place]  ?Referring Provider [name]  ? ?  ? ? ?Initial Encounter Date:  ?Flowsheet Row CARDIAC REHAB PHASE II ORIENTATION from 04/14/2021 in [place]  ?Date 04/14/21  ? ?  ? ? ?Visit Diagnosis: Chronic systolic CHF (congestive heart failure) ([place]) ? ?Patient's Home Medications on Admission: ? ?Current Outpatient Medications:  ?  amiodarone (PACERONE) 200 MG tablet, Take 1 tablet (200 mg total) by mouth daily., Disp: 30 tablet, Rfl: 6 ?  apixaban (ELIQUIS) 5 MG TABS tablet, Take 1 tablet (5 mg total) by mouth 2 (two) times daily., Disp: 60 tablet, Rfl: 6 ?  atorvastatin (LIPITOR) 80 MG tablet, Take 1 tablet (80 mg total) by mouth daily., Disp: 30 tablet, Rfl: 11 ?  carvedilol (COREG) 3.125 MG tablet, Take 1 tablet (3.125 mg total) by mouth 2 (two) times daily with a meal., Disp: 180 tablet, Rfl: 3 ?  clopidogrel (PLAVIX) 75 MG tablet, Take 1 tablet (75 mg total) by mouth daily., Disp: 30 tablet, Rfl: 6 ?  dapagliflozin propanediol (FARXIGA) 10 MG TABS tablet, Take 1 tablet (10 mg total) by mouth daily before breakfast., Disp: 30 tablet, Rfl: 11 ?  hydrOXYzine (ATARAX) 10 MG tablet, Take 10 mg by mouth at bedtime., Disp: , Rfl:  ?  isosorbide mononitrate (ISMO) 10 MG tablet, 1 tablet, Disp: , Rfl:  ?  nitroGLYCERIN (NITROSTAT) 0.4 MG SL tablet, Place 0.4 mg under the tongue every 5 (five) minutes x 3 doses as needed for chest pain., Disp: , Rfl:  ?  sacubitril-valsartan (ENTRESTO) 24-26 MG, Take 1 tablet by mouth 2 (two) times daily., Disp: 180 tablet, Rfl: 3 ?  sodium zirconium cyclosilicate (LOKELMA) 5 g packet, Take 5 g by mouth daily., Disp: 30 packet, Rfl: 6 ?  spironolactone (ALDACTONE) 25 MG tablet, Take 1 tablet (25 mg total) by mouth daily., Disp:  90 tablet, Rfl: 3 ? ?Past Medical History: ?Past Medical History:  ?Diagnosis Date  ? Atypical chest pain   ? CAD (coronary artery disease)   ? Dyslipidemia   ? Ex-smoker   ? History of depression   ? Insomnia   ? Ischemic cardiomyopathy   ? Ejection fraction 40-45%  ? NSTEMI (non-ST elevated myocardial infarction) ([place]) 08/2007  ? Treated with a bare metal stent to the proximal LAD  ? Suicide attempt [facility]) 01/2001  ? ? ?Tobacco Use: ?Social History  ? ?Tobacco Use  ?Smoking Status Former  ? Packs/day: 1.00  ? Years: 22.00  ? Pack years: 22.00  ? Types: Cigarettes, Cigars  ? Quit date: 06/28/2020  ? Years since quitting: 0.9  ?Smokeless Tobacco Never  ?Tobacco Comments  ? cigarettes stopped in 2020, swisher sweets started in 2018-04-05/day  ? ? ?Labs: ?Review Flowsheet   ? ?  ?  Latest Ref Rng & Units 07/06/2020 07/07/2020 07/08/2020 08/08/2020  ?Labs for ITP Cardiac and Pulmonary Rehab  ?Cholestrol 0 - 200 mg/dL    74    ?LDL (calc) 0 - 99 mg/dL    39    ?HDL-C >40 mg/dL    19    ?Trlycerides <150 mg/dL    78    ?O2 Saturation % 49.8   55.3   69.5     ? ?  12/04/2020  ?Labs for ITP Cardiac and Pulmonary Rehab  ?  Cholestrol 94    ?LDL (calc) 45    ?HDL-C 25    ?Trlycerides 118    ?O2 Saturation   ?  ? ? Multiple values from one day are sorted in reverse-chronological order  ?  ?  ? ? ?Capillary Blood Glucose: ?Lab Results  ?Component Value Date  ? GLUCAP 103 (H) 04/25/2021  ? GLUCAP 114 (H) 04/23/2021  ? GLUCAP 98 04/21/2021  ? GLUCAP 102 (H) 04/14/2021  ? GLUCAP 91 07/07/2020  ? ? POCT Glucose   ? ? [place] Name 04/14/21 1316  ?  ?  ?  ?  ?  ? POCT Blood Glucose  ? Pre-Exercise 102 mg/dL      ? ?  ?  ? ?  ? ? ?Exercise Target Goals: ?Exercise Program Goal: ?Individual exercise prescription set using results from initial 6 min walk test and THRR while considering  patient?s activity barriers and safety.  ? ?Exercise Prescription Goal: ?Starting with aerobic activity 30 plus minutes a day, 3 days per week for initial exercise  prescription. Provide home exercise prescription and guidelines that participant acknowledges understanding prior to discharge. ? ?Activity Barriers & Risk Stratification: ? Activity Barriers & Cardiac Risk Stratification - 04/14/21 1301   ? ?  ? Activity Barriers & Cardiac Risk Stratification  ? Activity Barriers Deconditioning;Decreased Ventricular Function;Assistive Device;Balance Concerns   ? Cardiac Risk Stratification High   ? ?  ?  ? ?  ? ? ?6 Minute Walk: ? 6 Minute Walk   ? ? [place] Name 04/14/21 1355  ?  ?  ?  ? 6 Minute Walk  ? Phase Initial    ? Distance 1100 feet    ? Walk Time 6 minutes    ? # of Rest Breaks 0    ? MPH 2.1    ? METS 4.44    ? RPE 10    ? VO2 Peak 15.54    ? Symptoms No    ? Resting HR 70 bpm    ? Resting BP 110/64    ? Resting Oxygen Saturation  97 %    ? Exercise Oxygen Saturation  during 6 min walk 96 %    ? Max Ex. HR 86 bpm    ? Max Ex. BP 120/65    ? 2 Minute Post BP 113/63    ? ?  ?  ? ?  ? ? ?Oxygen Initial Assessment: ? ? ?Oxygen Re-Evaluation: ? ? ?Oxygen Discharge (Final Oxygen Re-Evaluation): ? ? ?Initial Exercise Prescription: ? Initial Exercise Prescription - 04/14/21 1300   ? ?  ? Date of Initial Exercise RX and Referring Provider  ? Date 04/14/21   ? Referring Provider [name]   ? Expected Discharge Date 07/04/21   ?  ? Treadmill  ? MPH 1.2   ? Grade 0   ? Minutes 17   ?  ? Recumbant Elliptical  ? Level 1   ? Minutes 22   ?  ? Prescription Details  ? Frequency (times per week) 3   ? Duration Progress to 30 minutes of continuous aerobic without signs/symptoms of physical distress   ?  ? Intensity  ? THRR 40-80% of Max Heartrate 73-146   ? Ratings of Perceived Exertion 11-13   ? Perceived Dyspnea 0-4   ?  ? Resistance Training  ? Training Prescription Yes   ? Weight 3   ? Reps 10-15   ? ?  ?  ? ?  ? ? ?  Perform Capillary Blood Glucose checks as needed. ? ?Exercise Prescription Changes: ? ? Exercise Prescription Changes   ? ? [place] Name 04/14/21 1400 04/28/21 0900 04/28/21  1500 05/12/21 1024 05/26/21 1000  ?  ? Response to Exercise  ? Blood Pressure (Admit) 110/64 -- 1'18/78 94/50 92/60 '$  ? Blood Pressure (Exercise) 120/65 -- 122/68 92/48 124/60  ? Blood Pressure (Exit) 116/6 -- 1'08/58 92/58 82/56 '$  ? Heart Rate (Admit) -- -- 78 bpm 69 bpm 67 bpm  ? Heart Rate (Exercise) -- -- 68 bpm 94 bpm 108 bpm  ? Heart Rate (Exit) -- -- 58 bpm 78 bpm 77 bpm  ? Rating of Perceived Exertion (Exercise) -- -- '11 12 12  '$ ? Duration -- -- Continue with 30 min of aerobic exercise without signs/symptoms of physical distress. Continue with 30 min of aerobic exercise without signs/symptoms of physical distress. Continue with 30 min of aerobic exercise without signs/symptoms of physical distress.  ? Intensity -- -- THRR unchanged THRR unchanged THRR unchanged  ?  ? Progression  ? Progression -- -- Continue to progress workloads to maintain intensity without signs/symptoms of physical distress. Continue to progress workloads to maintain intensity without signs/symptoms of physical distress. Continue to progress workloads to maintain intensity without signs/symptoms of physical distress.  ?  ? Resistance Training  ? Training Prescription -- -- Yes Yes Yes  ? Weight -- -- '3 4 4  '$ ? Reps -- -- 10-15 10-15 10-15  ? Time -- -- 10 Minutes 10 Minutes 10 Minutes  ?  ? Treadmill  ? MPH -- -- 1.5 2 2.3  ? Grade -- -- 0 0 0  ? Minutes -- -- '17 17 17  '$ ? METs -- -- 2.15 2.53 2.76  ?  ? Recumbant Elliptical  ? Level -- -- '2 2 2  '$ ? RPM -- -- 51 67 71  ? Minutes -- -- '22 22 22  '$ ? METs -- -- 3.2 4 4.4  ?  ? Home Exercise Plan  ? Plans to continue exercise at -- Home (comment) -- -- --  ? Frequency -- Add 2 additional days to program exercise sessions. -- -- --  ? Initial Home Exercises Provided -- 04/28/21 -- -- --  ? ?  ?  ? ?  ? ? ?Exercise Comments: ? ? Exercise Comments   ? ? [place] Name 04/28/21 0959  ?  ?  ?  ?  ? Exercise Comments home exercise reviewed      ? ?  ?  ? ?  ? ? ?Exercise Goals and Review: ? ? Exercise Goals    ? ? [place] Name 04/14/21 1358 04/28/21 1554 05/26/21 1043  ?  ?  ?  ? Exercise Goals  ? Increase Physical Activity Yes Yes Yes    ? Intervention Provide advice, education, support and counseling about physical [place]

## 2021-05-30 ENCOUNTER — Encounter (HOSPITAL_COMMUNITY)
Admission: RE | Admit: 2021-05-30 | Discharge: 2021-05-30 | Disposition: A | Discharge status: Home / Self Care | Payer: Medicaid Other | Source: Ambulatory Visit | Attending: Cardiology | Admitting: Cardiology

## 2021-05-30 DIAGNOSIS — I5022 Chronic systolic (congestive) heart failure: Secondary | ICD-10-CM

## 2021-05-30 NOTE — Progress Notes (Signed)
Daily Session Note ? ?Patient Details  ?Name: Wyatt Donovan ?MRN: [id number] ?Date of Birth: [birth year] ?Referring Provider:   ?Flowsheet Row CARDIAC REHAB PHASE II ORIENTATION from 04/14/2021 in [place]  ?Referring Provider [name]  ? ?  ? ? ?Encounter Date: 05/30/2021 ? ?Check In: ? Session Check In - 05/30/21 0928   ? ?  ? Check-In  ? Supervising physician immediately available to respond to emergencies [facility] MD immediately available   ? Physician(s) [name]   ? Location [facility]   ? Staff Present Madelyn Flavors, RN, Bjorn Loser, MS, ACSM-CEP, Exercise Physiologist;Christy Oletta Lamas, RN, BSN   ? Virtual Visit No   ? Comments Started 05/22/2021 Entresto 34m-26mg po BID daily   ? Fall or balance concerns reported    Yes   ? Comments He uses a cane due to his balance, but he has not fallen. He reports getting off balance but not falling.   ? Tobacco Cessation No Change   ? Warm-up and Cool-down Performed as group-led instruction   ? Resistance Training Performed Yes   ? VAD Patient? No   ? PAD/SET Patient? No   ?  ? Pain Assessment  ? Currently in Pain? No/denies   ? Multiple Pain Sites No   ? ?  ?  ? ?  ? ? ?Capillary Blood Glucose: ?No results found for this or any previous visit (from the past 24 hour(s)). ? ? ? ?Social History  ? ?Tobacco Use  ?Smoking Status Former  ? Packs/day: 1.00  ? Years: 22.00  ? Pack years: 22.00  ? Types: Cigarettes, Cigars  ? Quit date: 06/28/2020  ? Years since quitting: 0.9  ?Smokeless Tobacco Never  ?Tobacco Comments  ? cigarettes stopped in 2020, swisher sweets started in 2018-04-05/day  ? ? ?Goals Met:  ?Independence with exercise equipment ?Exercise tolerated well ?No report of concerns or symptoms today ?Strength training completed today ? ?Goals Unmet:  ?Not Applicable ? ?Comments: Check out 1030 ? ? ?Dr. JCarlyle Dollyis Medical Director for [place]?

## 2021-06-02 ENCOUNTER — Encounter (HOSPITAL_COMMUNITY): Payer: Medicaid Other

## 2021-06-04 ENCOUNTER — Encounter (HOSPITAL_COMMUNITY)
Admission: RE | Admit: 2021-06-04 | Discharge: 2021-06-04 | Disposition: A | Discharge status: Home / Self Care | Payer: Medicaid Other | Source: Ambulatory Visit | Attending: Cardiology | Admitting: Cardiology

## 2021-06-04 DIAGNOSIS — I5022 Chronic systolic (congestive) heart failure: Secondary | ICD-10-CM | POA: Diagnosis present

## 2021-06-04 NOTE — Progress Notes (Signed)
Daily Session Note ? ?Patient Details  ?Name: Wyatt Donovan ?MRN: [id number] ?Date of Birth: 1985/04/30 ?Referring Provider:   ?Flowsheet Row CARDIAC REHAB PHASE II ORIENTATION from 04/14/2021 in [place]  ?Referring Provider [name]  ? ?  ? ? ?Encounter Date: 06/04/2021 ? ?Check In: ? Session Check In - 06/04/21 0930   ? ?  ? Check-In  ? Supervising physician immediately available to respond to emergencies [facility] MD immediately available   ? Physician(s) [name]   ? Location [facility]   ? Staff Present [name], BS, Exercise Physiologist;Dalton Kris Mouton, MS, ACSM-CEP, Exercise Physiologist;Phyllis Billingsley, RN   ? Medication changes reported     No   ? Fall or balance concerns reported    Yes   ? Comments He uses a cane due to his balance, but he has not fallen. He reports getting off balance but not falling.   ? Tobacco Cessation No Change   ? Warm-up and Cool-down Performed as group-led instruction   ? Resistance Training Performed Yes   ? VAD Patient? No   ? PAD/SET Patient? No   ?  ? Pain Assessment  ? Currently in Pain? No/denies   ? Multiple Pain Sites No   ? ?  ?  ? ?  ? ? ?Capillary Blood Glucose: ?No results found for this or any previous visit (from the past 24 hour(s)). ? ? ? ?Social History  ? ?Tobacco Use  ?Smoking Status Former  ? Packs/day: 1.00  ? Years: 22.00  ? Pack years: 22.00  ? Types: Cigarettes, Cigars  ? Quit date: 06/28/2020  ? Years since quitting: 0.9  ?Smokeless Tobacco Never  ?Tobacco Comments  ? cigarettes stopped in 2020, swisher sweets started in 2018-04-05/day  ? ? ?Goals Met:  ?Independence with exercise equipment ?Exercise tolerated well ?No report of concerns or symptoms today ?Strength training completed today ? ?Goals Unmet:  ?Not Applicable ? ?Comments: check out 1030 ? ? ?[name] is Medical Director for [place] ?

## 2021-06-06 ENCOUNTER — Encounter (HOSPITAL_COMMUNITY)
Admission: RE | Admit: 2021-06-06 | Discharge: 2021-06-06 | Disposition: A | Discharge status: Home / Self Care | Payer: Medicaid Other | Source: Ambulatory Visit | Attending: Cardiology | Admitting: Cardiology

## 2021-06-06 DIAGNOSIS — I5022 Chronic systolic (congestive) heart failure: Secondary | ICD-10-CM

## 2021-06-06 NOTE — Progress Notes (Signed)
Daily Session Note ? ?Patient Details  ?Name: Wyatt Donovan ?MRN: [id number] ?Date of Birth: 1986-02-21 ?Referring Provider:   ?Flowsheet Row CARDIAC REHAB PHASE II ORIENTATION from 04/14/2021 in [place]  ?Referring Provider [name]  ? ?  ? ? ?Encounter Date: 06/06/2021 ? ?Check In: ? Session Check In - 06/06/21 0930   ? ?  ? Check-In  ? Supervising physician immediately available to respond to emergencies [facility] MD immediately available   ? Physician(s) Dr Harrington Challenger   ? Location [facility]   ? Staff Present [name], BS, Exercise Physiologist;Selso Mannor Hassell Done, RN, Bjorn Loser, MS, ACSM-CEP, Exercise Physiologist   ? Virtual Visit No   ? Medication changes reported     No   ? Fall or balance concerns reported    Yes   ? Comments He uses a cane due to his balance, but he has not fallen. He reports getting off balance but not falling.   ? Tobacco Cessation No Change   ? Warm-up and Cool-down Performed as group-led instruction   ? Resistance Training Performed Yes   ? VAD Patient? No   ? PAD/SET Patient? No   ?  ? Pain Assessment  ? Currently in Pain? No/denies   ? Multiple Pain Sites No   ? ?  ?  ? ?  ? ? ?Capillary Blood Glucose: ?No results found for this or any previous visit (from the past 24 hour(s)). ? ? ? ?Social History  ? ?Tobacco Use  ?Smoking Status Former  ? Packs/day: 1.00  ? Years: 22.00  ? Pack years: 22.00  ? Types: Cigarettes, Cigars  ? Quit date: 06/28/2020  ? Years since quitting: 0.9  ?Smokeless Tobacco Never  ?Tobacco Comments  ? cigarettes stopped in 2020, swisher sweets started in 2018-04-05/day  ? ? ?Goals Met:  ?Independence with exercise equipment ?Exercise tolerated well ?No report of concerns or symptoms today ?Strength training completed today ? ?Goals Unmet:  ?Not Applicable ? ?Comments: Checkout at 1030. ? ? ?[name] is Medical Director for [place] ?

## 2021-06-09 ENCOUNTER — Encounter (HOSPITAL_COMMUNITY)
Admission: RE | Admit: 2021-06-09 | Discharge: 2021-06-09 | Disposition: A | Discharge status: Home / Self Care | Payer: Medicaid Other | Source: Ambulatory Visit | Attending: Cardiology | Admitting: Cardiology

## 2021-06-09 VITALS — Wt 188.3 lb

## 2021-06-09 DIAGNOSIS — I5022 Chronic systolic (congestive) heart failure: Secondary | ICD-10-CM | POA: Diagnosis not present

## 2021-06-09 NOTE — Progress Notes (Signed)
Daily Session Note ? ?Patient Details  ?Name: Wyatt Donovan ?MRN: [id number] ?Date of Birth: Oct 16, 1985 ?Referring Provider:   ?Flowsheet Row CARDIAC REHAB PHASE II ORIENTATION from 04/14/2021 in [place]  ?Referring Provider [name]  ? ?  ? ? ?Encounter Date: 06/09/2021 ? ?Check In: ? Session Check In - 06/09/21 0930   ? ?  ? Check-In  ? Supervising physician immediately available to respond to emergencies [facility] MD immediately available   ? Physician(s) Dr Johnsie Cancel   ? Location [facility]   ? Staff Present Geanie Cooley, RN;Heather Otho Ket, [place], Exercise Physiologist   ? Virtual Visit No   ? Medication changes reported     No   ? Comments Started 05/22/2021 Entresto 28m-26mg po BID daily   ? Fall or balance concerns reported    Yes   ? Comments He uses a cane due to his balance, but he has not fallen. He reports getting off balance but not falling.   ? Tobacco Cessation No Change   ? Warm-up and Cool-down Performed as group-led instruction   ? Resistance Training Performed Yes   ? VAD Patient? No   ? PAD/SET Patient? No   ?  ? Pain Assessment  ? Currently in Pain? No/denies   ? Multiple Pain Sites No   ? ?  ?  ? ?  ? ? ?Capillary Blood Glucose: ?No results found for this or any previous visit (from the past 24 hour(s)). ? ? ? ?Social History  ? ?Tobacco Use  ?Smoking Status Former  ? Packs/day: 1.00  ? Years: 22.00  ? Pack years: 22.00  ? Types: Cigarettes, Cigars  ? Quit date: 06/28/2020  ? Years since quitting: 0.9  ?Smokeless Tobacco Never  ?Tobacco Comments  ? cigarettes stopped in 2020, swisher sweets started in 2018-04-05/day  ? ? ?Goals Met:  ?Independence with exercise equipment ?Exercise tolerated well ?No report of concerns or symptoms today ?Strength training completed today ? ?Goals Unmet:  ?Not Applicable ? ?Comments: Check out at 1030 ? ? ?Dr. JCarlyle Dollyis Medical Director for [place]?

## 2021-06-11 ENCOUNTER — Encounter (HOSPITAL_COMMUNITY)
Admission: RE | Admit: 2021-06-11 | Discharge: 2021-06-11 | Disposition: A | Discharge status: Home / Self Care | Payer: Medicaid Other | Source: Ambulatory Visit | Attending: Cardiology | Admitting: Cardiology

## 2021-06-11 DIAGNOSIS — I5022 Chronic systolic (congestive) heart failure: Secondary | ICD-10-CM

## 2021-06-11 NOTE — Progress Notes (Signed)
Daily Session Note ? ?Patient Details  ?Name: Wyatt Donovan ?MRN: [id number] ?Date of Birth: 01-Jul-1985 ?Referring Provider:   ?Flowsheet Row CARDIAC REHAB PHASE II ORIENTATION from 04/14/2021 in [place]  ?Referring Provider [name]  ? ?  ? ? ?Encounter Date: 06/11/2021 ? ?Check In: ? Session Check In - 06/11/21 0930   ? ?  ? Check-In  ? Supervising physician immediately available to respond to emergencies [facility] MD immediately available   ? Physician(s) Dr Gasper Sells   ? Location [facility]   ? Staff Present Geanie Cooley, RN;Heather Otho Ket, BS, Exercise Physiologist;[name], RN, [name], RN, BSN   ? Virtual Visit No   ? Medication changes reported     No   ? Comments Started 05/22/2021 Entresto 31m-26mg po BID daily   ? Comments He uses a cane due to his balance, but he has not fallen. He reports getting off balance but not falling.   ? Tobacco Cessation No Change   ? Warm-up and Cool-down Performed as group-led instruction   ? Resistance Training Performed Yes   ? VAD Patient? No   ? PAD/SET Patient? No   ?  ? Pain Assessment  ? Currently in Pain? No/denies   ? Multiple Pain Sites No   ? ?  ?  ? ?  ? ? ?Capillary Blood Glucose: ?No results found for this or any previous visit (from the past 24 hour(s)). ? ? ? ?Social History  ? ?Tobacco Use  ?Smoking Status Former  ? Packs/day: 1.00  ? Years: 22.00  ? Pack years: 22.00  ? Types: Cigarettes, Cigars  ? Quit date: 06/28/2020  ? Years since quitting: 0.9  ?Smokeless Tobacco Never  ?Tobacco Comments  ? cigarettes stopped in 2020, swisher sweets started in 2018-04-05/day  ? ? ?Goals Met:  ?Independence with exercise equipment ?Exercise tolerated well ?No report of concerns or symptoms today ?Strength training completed today ? ?Goals Unmet:  ?Not Applicable ? ?Comments: Check out at 1030. ? ? ?Dr. JCarlyle Dollyis Medical Director for [place]?

## 2021-06-13 ENCOUNTER — Encounter (HOSPITAL_COMMUNITY)
Admission: RE | Admit: 2021-06-13 | Discharge: 2021-06-13 | Disposition: A | Discharge status: Home / Self Care | Payer: Medicaid Other | Source: Ambulatory Visit | Attending: Cardiology | Admitting: Cardiology

## 2021-06-13 DIAGNOSIS — I5022 Chronic systolic (congestive) heart failure: Secondary | ICD-10-CM | POA: Diagnosis not present

## 2021-06-13 NOTE — Progress Notes (Signed)
Daily Session Note ? ?Patient Details  ?Name: Wyatt Donovan ?MRN: [id number] ?Date of Birth: 1985-06-30 ?Referring Provider:   ?Flowsheet Row CARDIAC REHAB PHASE II ORIENTATION from 04/14/2021 in [place]  ?Referring Provider [name]  ? ?  ? ? ?Encounter Date: 06/13/2021 ? ?Check In: ? Session Check In - 06/13/21 0930   ? ?  ? Check-In  ? Supervising physician immediately available to respond to emergencies [facility] MD immediately available   ? Physician(s) Dr. Johnsie Cancel   ? Location [facility]   ? Staff Present [name], BS, Exercise Physiologist;Dane Bloch Kris Mouton, MS, ACSM-CEP, Exercise Physiologist   ? Virtual Visit No   ? Medication changes reported     No   ? Fall or balance concerns reported    Yes   ? Comments He uses a cane due to his balance, but he has not fallen. He reports getting off balance but not falling.   ? Tobacco Cessation No Change   ? Warm-up and Cool-down Performed as group-led instruction   ? Resistance Training Performed Yes   ? VAD Patient? No   ? PAD/SET Patient? No   ?  ? Pain Assessment  ? Currently in Pain? No/denies   ? Multiple Pain Sites No   ? ?  ?  ? ?  ? ? ?Capillary Blood Glucose: ?No results found for this or any previous visit (from the past 24 hour(s)). ? ? ? ?Social History  ? ?Tobacco Use  ?Smoking Status Former  ? Packs/day: 1.00  ? Years: 22.00  ? Pack years: 22.00  ? Types: Cigarettes, Cigars  ? Quit date: 06/28/2020  ? Years since quitting: 0.9  ?Smokeless Tobacco Never  ?Tobacco Comments  ? cigarettes stopped in 2020, swisher sweets started in 2018-04-05/day  ? ? ?Goals Met:  ?Independence with exercise equipment ?Exercise tolerated well ?No report of concerns or symptoms today ?Strength training completed today ? ?Goals Unmet:  ?Not Applicable ? ?Comments: checkout time is 1030 ? ? ?[name] is Medical Director for [place] ?

## 2021-06-16 ENCOUNTER — Encounter (HOSPITAL_COMMUNITY)
Admission: RE | Admit: 2021-06-16 | Discharge: 2021-06-16 | Disposition: A | Discharge status: Home / Self Care | Payer: Medicaid Other | Source: Ambulatory Visit | Attending: Cardiology | Admitting: Cardiology

## 2021-06-16 DIAGNOSIS — I5022 Chronic systolic (congestive) heart failure: Secondary | ICD-10-CM

## 2021-06-16 NOTE — Progress Notes (Signed)
Daily Session Note ? ?Patient Details  ?Name: Wyatt Donovan ?MRN: [id number] ?Date of Birth: 02/06/1986 ?Referring Provider:   ?Flowsheet Row CARDIAC REHAB PHASE II ORIENTATION from 04/14/2021 in [place] [facility]  ?Referring Provider Dr. McLean  ? ?  ? ? ?Encounter Date: 06/16/2021 ? ?Check In: ? Session Check In - 06/16/21 0930   ? ?  ? Check-In  ? Supervising physician immediately available to respond to emergencies [facility] MD immediately available   ? Physician(s) Dr. Nishan   ? Location [facility]   ? Staff Present Phyllis Billingsley, RN;Heather Jachimiak, BS, Exercise Physiologist;Dalton Fletcher, MS, ACSM-CEP, Exercise Physiologist   ? Virtual Visit No   ? Medication changes reported     No   ? Comments Started 05/22/2021 Entresto 24mg-26mg po BID daily   ? Comments He uses a cane due to his balance, but he has not fallen. He reports getting off balance but not falling.   ? Tobacco Cessation No Change   ? Warm-up and Cool-down Performed as group-led instruction   ? Resistance Training Performed Yes   ? VAD Patient? No   ? PAD/SET Patient? No   ?  ? Pain Assessment  ? Currently in Pain? No/denies   ? Multiple Pain Sites No   ? ?  ?  ? ?  ? ? ?Capillary Blood Glucose: ?No results found for this or any previous visit (from the past 24 hour(s)). ? ? ? ?Social History  ? ?Tobacco Use  ?Smoking Status Former  ? Packs/day: 1.00  ? Years: 22.00  ? Pack years: 22.00  ? Types: Cigarettes, Cigars  ? Quit date: 06/28/2020  ? Years since quitting: 0.9  ?Smokeless Tobacco Never  ?Tobacco Comments  ? cigarettes stopped in 2020, swisher sweets started in 2018-04-05/day  ? ? ?Goals Met:  ?Independence with exercise equipment ?Exercise tolerated well ?No report of concerns or symptoms today ?Strength training completed today ? ?Goals Unmet:  ?Not Applicable ? ?Comments: Checkout at 1030. ? ? ?Dr. Jonathan Branch is Medical Director for [facility] ?

## 2021-06-16 NOTE — Progress Notes (Incomplete)
***In Progress*** ? ?  ?Advanced Heart Failure Clinic Note  ?Primary Care: Leonie Douglas, MD ?HF Cardiologist: [name] ?EP: Dr Quentin Ore  ? ?HPI:  ?Patient is a 36 y.o. with history of early onset CAD s/p CABG, ischemic cardiomyopathy, and LV thrombus. Patient had initial MI in 2012 at age 73, PCI to LAD.  He had inferoposterior MI in 05/2020.  LHC showed 3 vessel disease, and patient had CABG x 4. Cardiac MRI in 06/2020 showed LV EF 27% with LV thrombus, there was significant viability.  Post-op, patient had GI bleeding and anticoagulation was stopped.  He quit smoking after CABG.  ?  ?Follow up 10/04/20 carvedilol decreased due to dizziness and repeat echo arranged.  Echo 09/2020 EF 25-30%. He was referred to EP for ICD consideration. ?  ?Admitted 12/03/20 with NSTEMI. Coronary angiogram showed patent LIMA to LAD with occlusion of all other grafts. S/p PCI/DES to native mid LCX 99% stenosis.  Native LAD and RCA occluded. Angina resolved post PCI. Farxiga and carvedilol added back, however spironolactone and losartan held due to hyperkalemia during this admission. His hospitalization was complicated by atrial fibrillation and flutter with RVR. He received IV amiodarone and started on Eliquis.  He underwent DCCV with conversion to NSR on 12/06/20. Discharged home 12/06/20, weight 177.5 lbs. ? ?Returned for followup of CHF 05/21/21.  He was not smoking or drinking ETOH.  Was then going cardiac rehab.  Reported slowly getting stronger.  Still used cane for stability.  He denied dyspnea walking on flat ground though he got tired relatively easily.  No chest pain.  No lightheadedness.  No BRBPR/melena.  He reported constipation, this concerned him for colon cancer (father with colon cancer at 64, several other relatives with early colon cancer). Weight wass up 2 lbs.  ? ?Today he returns to HF clinic for pharmacist medication titration. At last visit with MD Entresto 24/26 mg BID was started.  ? ?Today he returns to HF  clinic for pharmacist medication titration. At last visit with MD ***.  ? ?Overall feeling ***. ?Dizziness, lightheadedness, fatigue:  ?Chest pain or palpitations: ? ?How is your breathing?: *** ?SOB: ?Able to complete all ADLs. Activity level *** ? ?Weight at home pounds. Takes furosemide/torsemide/bumex *** mg *** daily.  ?LEE ?PND/Orthopnea ? ?Appetite *** ?Low-salt diet:  ? ?Physical Exam ?Cost/affordability of meds ? ? ?Shortness of breath/dyspnea on exertion? {YES NO:22349}  ?Orthopnea/PND? {YES NO:22349} ?Edema? {YES NO:22349} ?Lightheadedness/dizziness? {YES NO:22349} ?Daily weights at home? {YES NO:22349} ?Blood pressure/heart rate monitoring at home? {YES NO:22349} ?Following low-sodium/fluid-restricted diet? {YES NO:22349} ? ?HF Medications: ?Carvedilol 3.125 mg BID ?Entresto 24/26 mg BID ?Spironolactone 25 mg Daily ?Farxiga 10 mg daily ? ?Has the patient been experiencing any side effects to the medications prescribed?  {YES NO:22349} ? ?Does the patient have any problems obtaining medications due to transportation or finances?   {YES NO:22349} ? ?Understanding of regimen: {excellent/good/fair/poor:19665} ?Understanding of indications: {excellent/good/fair/poor:19665} ?Potential of compliance: {excellent/good/fair/poor:19665} ?Patient understands to avoid NSAIDs. ?Patient understands to avoid decongestants. ?  ? ?Pertinent Lab Values: ?05/21/21: Serum creatinine 0.95, BUN 19, Potassium 4.2, Sodium 141 ? ?Vital Signs: ?Weight: *** (last clinic weight: 193 lbs) ?Blood pressure: *** 102/70 ?Heart rate: *** 60 ? ?Assessment/Plan: ?1. CAD: early onset CAD s/p MI with LAD PCI in 2012,followed by CABG  x 4 05/2020  Admit 11/2020 with NSTEMI. LHC this admission with patent LIMA to LAD, all other grafts occluded. S/p PCI DES native mid Lcx 99% stenosis. No chest pain.  ?-  Continue Plavix at least 6 months, ideally 1 year.  ?- He is on Eliquis so no ASA.   ?- Continue atorvastatin 80 mg, good lipids in 11/2020.   ? ?2. Chronic Systolic CHF: Ischemic cardiomyopathy.  [place].  Most recent study was a cardiac MRI in 11/2020 with LVEF 29% and RVEF 45%.  NYHA class II symptoms, he is not volume overloaded on exam. GDMT has been limited by hypotension & hyperkalemia, K is now controlled with Lokelma.   ?- Continue Carvedilol 3.125 mg bid. ?- Continue Farxiga 10 mg daily.  ?- Continue spironolactone 25 mg daily + Lokelma 5 g daily given history of hyperkalemia.   ?- Continue Entresto 24/26 bid.   ?- Continue cardiac rehab at [facility].  ? ?3. LV thrombus: Noted on cMRI 06/2020. Anticoagulation previously stopped d/t GI bleed. No thrombus on echo 08/22. No thrombus on cMRI 12/06/20. ?- He is now on Eliquis 5 mg twice a day ? ?4. Hyperkalemia: He has had a tendency towards hyperkalemia.  I think that he will need Lokelma in order to take spironolactone and Entresto.  See above.  ? ?5. Atrial flutter/fibrillation: Both arrhythmias noted in 11/2020.  He tolerated poorly with worsened symptoms.  He had DCCV in 11/2020.  He is maintaining NSR on amiodarone, but amiodarone not ideal in a patient this young.  He saw Dr. Quentin Ore who wants to see clinical stability for 6-12 months prior to atrial fibrillation ablation.  ?- Continue amiodarone 200 mg daily. He will need a regular eye exam.  ?- Followup with EP, hope for eventual AF ablation as above.   ?- Continue Eliquis.  ? ?6. Former Smoker: Last cigarette 06/30/20 ? ?Follow up on 07/03/21 with APP clinc.  ? ? ?Audry Riles, PharmD, BCPS, BCCP, CPP ?Heart Failure Clinic Pharmacist ?[phone] ? ? ?

## 2021-06-17 ENCOUNTER — Ambulatory Visit (HOSPITAL_COMMUNITY)
Admission: RE | Admit: 2021-06-17 | Discharge: 2021-06-17 | Disposition: A | Discharge status: Home / Self Care | Payer: Medicaid Other | Source: Ambulatory Visit | Attending: Internal Medicine | Admitting: Internal Medicine

## 2021-06-17 VITALS — BP 88/60 | HR 64 | Wt 192.0 lb

## 2021-06-17 DIAGNOSIS — K921 Melena: Secondary | ICD-10-CM | POA: Diagnosis not present

## 2021-06-17 DIAGNOSIS — Z95810 Presence of automatic (implantable) cardiac defibrillator: Secondary | ICD-10-CM | POA: Insufficient documentation

## 2021-06-17 DIAGNOSIS — Z79899 Other long term (current) drug therapy: Secondary | ICD-10-CM | POA: Insufficient documentation

## 2021-06-17 DIAGNOSIS — I959 Hypotension, unspecified: Secondary | ICD-10-CM | POA: Diagnosis not present

## 2021-06-17 DIAGNOSIS — I4892 Unspecified atrial flutter: Secondary | ICD-10-CM | POA: Insufficient documentation

## 2021-06-17 DIAGNOSIS — Z951 Presence of aortocoronary bypass graft: Secondary | ICD-10-CM | POA: Diagnosis not present

## 2021-06-17 DIAGNOSIS — Z7902 Long term (current) use of antithrombotics/antiplatelets: Secondary | ICD-10-CM | POA: Insufficient documentation

## 2021-06-17 DIAGNOSIS — I4891 Unspecified atrial fibrillation: Secondary | ICD-10-CM | POA: Diagnosis not present

## 2021-06-17 DIAGNOSIS — E875 Hyperkalemia: Secondary | ICD-10-CM | POA: Diagnosis not present

## 2021-06-17 DIAGNOSIS — I2510 Atherosclerotic heart disease of native coronary artery without angina pectoris: Secondary | ICD-10-CM | POA: Diagnosis not present

## 2021-06-17 DIAGNOSIS — Z955 Presence of coronary angioplasty implant and graft: Secondary | ICD-10-CM | POA: Diagnosis not present

## 2021-06-17 DIAGNOSIS — I252 Old myocardial infarction: Secondary | ICD-10-CM | POA: Insufficient documentation

## 2021-06-17 DIAGNOSIS — Z800 Family history of malignant neoplasm of digestive organs: Secondary | ICD-10-CM | POA: Insufficient documentation

## 2021-06-17 DIAGNOSIS — Z7984 Long term (current) use of oral hypoglycemic drugs: Secondary | ICD-10-CM | POA: Insufficient documentation

## 2021-06-17 DIAGNOSIS — Z7901 Long term (current) use of anticoagulants: Secondary | ICD-10-CM | POA: Insufficient documentation

## 2021-06-17 DIAGNOSIS — I5022 Chronic systolic (congestive) heart failure: Secondary | ICD-10-CM | POA: Diagnosis present

## 2021-06-17 DIAGNOSIS — Z87891 Personal history of nicotine dependence: Secondary | ICD-10-CM | POA: Insufficient documentation

## 2021-06-17 LAB — BASIC METABOLIC PANEL
Anion gap: 5 (ref 5–15)
BUN: 15 mg/dL (ref 6–20)
CO2: 23 mmol/L (ref 22–32)
Calcium: 8.9 mg/dL (ref 8.9–10.3)
Chloride: 116 mmol/L — ABNORMAL HIGH (ref 98–111)
Creatinine, Ser: 1.08 mg/dL (ref 0.61–1.24)
GFR, Estimated: >60 mL/min (ref >60)
Glucose, Bld: 107 mg/dL — ABNORMAL HIGH (ref 70–99)
Potassium: 4.4 mmol/L (ref 3.5–5.1)
Sodium: 144 mmol/L (ref 135–145)

## 2021-06-17 NOTE — Progress Notes (Signed)
?  ?Advanced Heart Failure Clinic Note  ? ?Primary Care: Leonie Douglas, MD ?HF Cardiologist: [name] ?EP: Dr. Quentin Ore  ? ?HPI:  ?Patient is a 36 y.o.m with history of early onset CAD s/p CABG, ischemic cardiomyopathy, and LV thrombus. Patient had initial MI in 2012 at age 72, PCI to LAD.  He had inferoposterior MI in 05/2020.  LHC showed 3 vessel disease, and patient had CABG x 4. Cardiac MRI in 06/2020 showed LV EF 27% with LV thrombus, there was significant viability.  Post-op, patient had GI bleeding and anticoagulation was stopped. He quit smoking after CABG.  ?  ?Follow up 10/04/2020 carvedilol decreased due to dizziness and repeat echo arranged.  Echo 09/2020 EF 25-30%. He was referred to EP for ICD consideration. ?  ?Admitted 12/03/2020 with NSTEMI. Coronary angiogram showed patent LIMA to LAD with occlusion of all other grafts. S/p PCI/DES to native mid LCX 99% stenosis.  Native LAD and RCA occluded. Angina resolved post PCI. Farxiga and carvedilol added back, however spironolactone and losartan held due to hyperkalemia during this admission. His hospitalization was complicated by atrial fibrillation and flutter with RVR. He received IV amiodarone and started on Eliquis.  He underwent DCCV with conversion to NSR on 12/06/2020. Discharged home 12/06/2020, weight 177.5 lbs. ? ?Returned to [facility] for follow-up of CHF 05/21/2021.  He was not smoking or drinking ETOH.  Was going to cardiac rehab.  Reported slowly getting stronger.  Still used cane for stability.  He denied dyspnea walking on flat ground though he got tired relatively easily.  No chest pain.  No lightheadedness.  No BRBPR/melena.  He reported constipation, this concerned him for colon cancer (father with colon cancer at 44, several other relatives with early colon cancer). Weight was up 2 lbs.  ? ?Today he returns to HF clinic for pharmacist medication titration. At last visit with MD, losartan was discontinued and Entresto 24/26 mg BID was  initiated. Main complaint is small amounts of bright red blood in his stool with every bowel movement, ongoing for 2-3 weeks. States he isn't straining during BM's so doesn't think it's hemorrhoids, amount of blood is stable per patient report. Notes his grandfather and father both have had colon cancer. States he was given a referral at his last visit, but hasn't heard anything since. Gave patient phone number for [facility] (referral was sent here) and instructed him to call their office. Overall feeling fine besides the blood in his stool. Endorses dizziness x2 last week, and believes it is related to blood loss in stool. States he feels tired "all the time." Denies chest pain and palpitations. Says his breathing is "on and off," and that he sometimes gets out of breath watching TV. Able to participate in cardiac rehab but feels he strains to breathe sometimes there. Able to complete ADLs. Weight at home stable at 190 lbs, not on a loop diuretic. No LEE on exam. Sleeps with three pillows chronically, sometimes needs to prop them up a little bit more against his headboard at night to help his breathing. Endorses good appetite and a low-salt diet.  ? ?HF Medications: ?Carvedilol 3.125 mg BID ?Entresto 24/26 mg BID ?Spironolactone 25 mg daily ?Farxiga 10 mg daily ? ?Has the patient been experiencing any side effects to the medications prescribed?  No ? ?Does the patient have any problems obtaining medications due to transportation or finances?   No [place] Medicaid ? ?Understanding of regimen: good ?Understanding of indications: good ?Potential  of compliance: good ?Patient understands to avoid NSAIDs. ?Patient understands to avoid decongestants. ? ?Pertinent Lab Values: ?06/17/21: Serum Creatinine 1.08, Sodium 144, BUN 15, Potassium 4.4 ? ?Vital Signs: ?Weight: 192 lbs (last clinic weight: 193 lbs) ?Blood pressure: 88/60 mmHg ?Heart rate: 64 bpm ? ?Assessment/Plan: ?1. CAD: early onset CAD s/p MI with LAD PCI in  2012, followed by CABG  x 4 05/2020  Admit 11/2020 with NSTEMI. LHC this admission with patent LIMA to LAD, all other grafts occluded. S/p PCI DES native mid Lcx 99% stenosis. No chest pain.  ?- Continue Plavix at least 6 months, ideally 1 year.  ?- He is on Eliquis so no ASA.   ?- Continue atorvastatin 80 mg daily, good lipids in 11/2020.  ? ?2. Chronic Systolic CHF: Ischemic cardiomyopathy.  [place].  Most recent study was a cardiac MRI in 11/2020 with LVEF 29% and RVEF 45%.   ?- NYHA class II symptoms, he is not volume overloaded on exam.  ?- GDMT has been limited by hypotension & hyperkalemia, K is now controlled with Lokelma.  ?- BMET today stable.  ?- Continue Carvedilol 3.125 mg BID. ?- Continue Entresto 24/26 mg BID. Unable to increase today due to low BP in clinic. Will need to monitor BP carefully.    ?- Continue Farxiga 10 mg daily.  ?- Continue spironolactone 25 mg daily + Lokelma 5 g daily given history of hyperkalemia.   ?- Continue cardiac rehab at [facility]  ? ?3. LV thrombus: Noted on cMRI 06/2020. Anticoagulation previously stopped d/t GI bleed. No thrombus on echo 09/2020. No thrombus on cMRI 12/06/2020. ?- He is now on Eliquis 5 mg BID ? ?4. Hyperkalemia: He has had a tendency towards hyperkalemia. K 4.4 today.  ?- Continue Lokelma  5 g daily ? ?5. Atrial flutter/fibrillation: Both arrhythmias noted in 11/2020.  He tolerated poorly with worsened symptoms.  He had DCCV in 11/2020.  He is maintaining NSR on amiodarone, but amiodarone not ideal in a patient this young.  He saw Dr. Quentin Ore who wants to see clinical stability for 6-12 months prior to atrial fibrillation ablation.  ?- Continue amiodarone 200 mg daily. He will need a regular eye exam.  ?- Followup with EP, hope for eventual AF ablation as above.   ?- Continue Eliquis. Of note, has had small amounts of blood in his stool recently. Referred to [facility], consider follow up CBC at next visit if not able to see GI until then.   ? ?6. Former Smoker: Last cigarette 06/30/2020 ? ?Follow up on 07/03/2021 with APP clinc.  ? ?Audry Riles, PharmD, BCPS, BCCP, CPP ?Heart Failure Clinic Pharmacist ?[phone] ? ? ?

## 2021-06-17 NOTE — Patient Instructions (Signed)
It was a pleasure seeing you today! ? ?MEDICATIONS: ?-No medication changes today ?-Call if you have questions about your medications. ? ?LABS: ?-We will call you if your labs need attention. ? ?REFERRALS: ?-Call [phone] to follow-up with gastroenterology for an appointment ? ?NEXT APPOINTMENT: ?Return to clinic in 07/03/2021 with NP/PA. ? ?In general, to take care of your heart failure: ?-Limit your fluid intake to 2 Liters (half-gallon) per day.   ?-Limit your salt intake to ideally 2-3 grams (2000-3000 mg) per day. ?-Weigh yourself daily and record, and bring that "weight diary" to your next appointment.  (Weight gain of 2-3 pounds in 1 day typically means fluid weight.) ?-The medications for your heart are to help your heart and help you live longer.   ?-Please contact us before stopping any of your heart medications. ? ?Call the clinic at [phone] with questions or to reschedule future appointments.  ?

## 2021-06-18 ENCOUNTER — Encounter (HOSPITAL_COMMUNITY): Payer: Medicaid Other

## 2021-06-19 ENCOUNTER — Encounter: Payer: Self-pay | Admitting: Internal Medicine

## 2021-06-19 ENCOUNTER — Other Ambulatory Visit (INDEPENDENT_AMBULATORY_CARE_PROVIDER_SITE_OTHER): Payer: Medicaid Other

## 2021-06-19 ENCOUNTER — Ambulatory Visit (INDEPENDENT_AMBULATORY_CARE_PROVIDER_SITE_OTHER): Payer: Medicaid Other | Admitting: Internal Medicine

## 2021-06-19 ENCOUNTER — Telehealth: Payer: Self-pay

## 2021-06-19 VITALS — BP 90/60 | HR 61 | Ht 73.0 in | Wt 192.0 lb

## 2021-06-19 DIAGNOSIS — R7989 Other specified abnormal findings of blood chemistry: Secondary | ICD-10-CM | POA: Diagnosis not present

## 2021-06-19 DIAGNOSIS — K625 Hemorrhage of anus and rectum: Secondary | ICD-10-CM

## 2021-06-19 DIAGNOSIS — Z1211 Encounter for screening for malignant neoplasm of colon: Secondary | ICD-10-CM | POA: Diagnosis not present

## 2021-06-19 DIAGNOSIS — R1013 Epigastric pain: Secondary | ICD-10-CM | POA: Diagnosis not present

## 2021-06-19 DIAGNOSIS — R1030 Lower abdominal pain, unspecified: Secondary | ICD-10-CM

## 2021-06-19 DIAGNOSIS — Z800 Family history of malignant neoplasm of digestive organs: Secondary | ICD-10-CM

## 2021-06-19 DIAGNOSIS — K921 Melena: Secondary | ICD-10-CM

## 2021-06-19 LAB — IBC + FERRITIN
Ferritin: 22.8 ng/mL (ref 22.0–322.0)
Iron: 101 ug/dL (ref 42–165)
Saturation Ratios: 26.9 % (ref 20.0–50.0)
TIBC: 375.2 ug/dL (ref 250.0–450.0)
Transferrin: 268 mg/dL (ref 212.0–360.0)

## 2021-06-19 LAB — LIPASE: Lipase: 14 U/L (ref 11.0–59.0)

## 2021-06-19 NOTE — Progress Notes (Signed)
? ?Chief Complaint: Blood in stools ? ?HPI : 36 year old male with history of CAD s/p CABG and PCI, HFrEF (35-40%), depression presents with blood in stools. ? ?He has been seeing blood in his stools for the last 2 weeks. His stools have looked black at times, and when he wipes, he will see red on the toilet paper. Sometimes when he has a stool, he will see some scant amounts of red in the toilet bowl water. He had a bout of severe constipation before his bleeding stopped. It took him 3-4 days before he was able to have a BM. His constipation gotten a little bit better since then. He is on both Eliquis and Plavix. Dad, paternal grandfather, and paternal aunt had colon cancer. Dad was diagnosed with colon cancer at age 80. He follows with [name] for his cardiology care. Denies prior colonoscopy or EGD. When he finishes eating dinner, he will have some mild abdominal pain. Denies N&V, dysphagia, weight loss. He does have a little bit of heartburn for which he takes Tums. Denies alcohol use. Denies fam hx of liver disease. He used to be heavy smoker. Denies NSAID use. ? ?Wt Readings from Last 3 Encounters:  ?06/19/21 192 lb (87.1 kg)  ?06/17/21 192 lb (87.1 kg)  ?06/09/21 188 lb 4.4 oz (85.4 kg)  ? ?Past Medical History:  ?Diagnosis Date  ? Atypical chest pain   ? CAD (coronary artery disease)   ? Dyslipidemia   ? Ex-smoker   ? History of depression   ? Insomnia   ? Ischemic cardiomyopathy   ? Ejection fraction 40-45%  ? NSTEMI (non-ST elevated myocardial infarction) ([place]) 08/2007  ? Treated with a bare metal stent to the proximal LAD  ? Suicide attempt [facility]) 01/2001  ? ? ? ?Past Surgical History:  ?Procedure Laterality Date  ? ADENOIDECTOMY    ? CARDIOVERSION N/A 12/06/2020  ? Procedure: CARDIOVERSION;  Surgeon: Larey Dresser, MD;  Location: [place];  Service: Cardiovascular;  Laterality: N/A;  ? CORONARY ARTERY BYPASS GRAFT N/A 07/03/2020  ? Procedure: CORONARY ARTERY BYPASS GRAFTING (CABG) times four using  left internal mammary artery, right arm radial artery and right leg saphenous vein;  Surgeon: Lajuana Matte, MD;  Location: [place];  Service: Open Heart Surgery;  Laterality: N/A;  ? CORONARY STENT INTERVENTION N/A 12/03/2020  ? Procedure: CORONARY STENT INTERVENTION;  Surgeon: Martinique, Peter M, MD;  Location: [place] CV LAB;  Service: Cardiovascular;  Laterality: N/A;  ? CORONARY STENT PLACEMENT    ? Bare metal stent to proximal LAD  ? ICD IMPLANT N/A 02/10/2021  ? Procedure: ICD IMPLANT;  Surgeon: [name], MD;  Location: [place] [facility];  Service: Cardiovascular;  Laterality: N/A;  ? LEFT HEART CATH AND CORONARY ANGIOGRAPHY N/A 07/01/2020  ? Procedure: LEFT HEART CATH AND CORONARY ANGIOGRAPHY;  Surgeon: Lorretta Harp, MD;  Location: [place] [facility];  Service: Cardiovascular;  Laterality: N/A;  ? LEFT HEART CATH AND CORS/GRAFTS ANGIOGRAPHY N/A 12/03/2020  ? Procedure: LEFT HEART CATH AND CORS/GRAFTS ANGIOGRAPHY;  Surgeon: Martinique, Peter M, MD;  Location: [place] [facility];  Service: Cardiovascular;  Laterality: N/A;  ? RADIAL ARTERY HARVEST Right 07/03/2020  ? Procedure: RADIAL ARTERY HARVEST;  Surgeon: Lajuana Matte, MD;  Location: [place];  Service: Open Heart Surgery;  Laterality: Right;  ? TEE WITHOUT CARDIOVERSION N/A 07/03/2020  ? Procedure: TRANSESOPHAGEAL ECHOCARDIOGRAM (TEE);  Surgeon: Lajuana Matte, MD;  Location: [place];  Service: Open Heart Surgery;  Laterality: N/A;  ? ?Family History  ?Problem Relation Age of Onset  ? Colon cancer Father   ? Colon cancer Paternal Grandfather   ? Colon cancer Paternal Aunt   ? Hypertension Neg Hx   ? Diabetes Neg Hx   ? Coronary artery disease Neg Hx   ? ?Social History  ? ?Tobacco Use  ? Smoking status: Former  ?  Packs/day: 1.00  ?  Years: 22.00  ?  Pack years: 22.00  ?  Types: Cigarettes, Cigars  ?  Quit date: 06/28/2020  ?  Years since quitting: 0.9  ? Smokeless tobacco: Never  ? Tobacco comments:  ?  cigarettes stopped in 2020,  swisher sweets started in 2018-04-05/day  ?Vaping Use  ? Vaping Use: Never used  ?Substance Use Topics  ? Alcohol use: Never  ? Drug use: Not Currently  ?  Types: Marijuana  ? ?Current Outpatient Medications  ?Medication Sig Dispense Refill  ? amiodarone (PACERONE) 200 MG tablet Take 1 tablet (200 mg total) by mouth daily. 30 tablet 6  ? apixaban (ELIQUIS) 5 MG TABS tablet Take 1 tablet (5 mg total) by mouth 2 (two) times daily. 60 tablet 6  ? atorvastatin (LIPITOR) 80 MG tablet Take 1 tablet (80 mg total) by mouth daily. 30 tablet 11  ? carvedilol (COREG) 3.125 MG tablet Take 1 tablet (3.125 mg total) by mouth 2 (two) times daily with a meal. 180 tablet 3  ? clopidogrel (PLAVIX) 75 MG tablet Take 1 tablet (75 mg total) by mouth daily. 30 tablet 6  ? dapagliflozin propanediol (FARXIGA) 10 MG TABS tablet Take 1 tablet (10 mg total) by mouth daily before breakfast. 30 tablet 11  ? hydrOXYzine (ATARAX) 10 MG tablet Take 10 mg by mouth at bedtime.    ? nitroGLYCERIN (NITROSTAT) 0.4 MG SL tablet Place 0.4 mg under the tongue every 5 (five) minutes x 3 doses as needed for chest pain.    ? sacubitril-valsartan (ENTRESTO) 24-26 MG Take 1 tablet by mouth 2 (two) times daily. 180 tablet 3  ? sodium zirconium cyclosilicate (LOKELMA) 5 g packet Take 5 g by mouth daily. 30 packet 6  ? spironolactone (ALDACTONE) 25 MG tablet Take 1 tablet (25 mg total) by mouth daily. 90 tablet 3  ? ?No current facility-administered medications for this visit.  ? ?Allergies  ?Allergen Reactions  ? Codeine Itching  ? ? ? ?Review of Systems: ?All systems reviewed and negative except where noted in HPI.  ? ?Physical Exam: ?BP 90/60   Pulse 61   Ht '6\' 1"'$  (1.854 m)   Wt 192 lb (87.1 kg)   BMI 25.33 kg/m?  ?Constitutional: Pleasant,well-developed, male in no acute distress. ?HEENT: Normocephalic and atraumatic. Conjunctivae are normal. No scleral icterus. ?Cardiovascular: Normal rate, regular rhythm.  ?Pulmonary/chest: Effort normal and breath sounds  normal. No wheezing, rales or rhonchi. ?Abdominal: Soft, nondistended, tender in the epigastric and lower abdomen ?Extremities: No edema ?Neurological: Alert and oriented to person place and time. ?Skin: Skin is warm and dry. No rashes noted. ?Psychiatric: Normal mood and affect. Behavior is normal. ? ?Labs 04/2021: CBC nml. TSH nml. CMP with mildly elevated ALT of 69 ? ?Labs 05/2021: BMP unremarkable ? ?ASSESSMENT AND PLAN: ?Rectal bleeding ?Melena ?Family history of colon cancer ?Colon cancer screening ?Elevated ALT ?Epigastric and lower abdominal pain ?Patient presents with recent onset of rectal bleeding and melena. Unclear etiology, no prior scopes. Will plan for further evaluation with EGD and colonoscopy. Patient  was also tender upon palpation today so will rule out pancreatitis and obtain a CT A/P for further evaluation. Patient has been noted in the past to have an elevated ALT so will plan for a broad liver work up due to his young age.  ?- Check lipase, ANA, ASMA, IgG, AMA, ferritin, iron/TIBC, viral hepatitis, ceruloplasmin, alpha 1-antitrypsin ?- CT A/P w/contrast ?- EGD/Colonoscopy [facility]. Touch base with [name] to hold Plavix 7 days and Eliquis 2 days beforehand. ? ?[name], MD ? ?

## 2021-06-19 NOTE — Patient Instructions (Signed)
If you are age 38 or older, your body mass index should be between 23-30. Your Body mass index is 25.33 kg/m?Marland Kitchen If this is out of the aforementioned range listed, please consider follow up with your Primary Care Provider. ? ?If you are age 48 or younger, your body mass index should be between 19-25. Your Body mass index is 25.33 kg/m?Marland Kitchen If this is out of the aformentioned range listed, please consider follow up with your Primary Care Provider.  ? ?You have been scheduled for an endoscopy and colonoscopy. Please follow the written instructions given to you at your visit today. ?Please pick up your prep supplies at the pharmacy within the next 1-3 days. ?If you use inhalers (even only as needed), please bring them with you on the day of your procedure.  ? ?Your provider has requested that you go to the basement level for lab work before leaving today. Press "B" on the elevator. The lab is located at the first door on the left as you exit the elevator. ? ?You will be contacted by [place] in the next 2 days to arrange a CT abdomen and Pelvis.  The number on your caller ID will be [phone], please answer when they call.  If you have not heard from them in 2 days please call [phone] to schedule.   ? ? ? ?The [facility] providers would like to encourage you to use [facility] to communicate with providers for non-urgent requests or questions.  Due to long hold times on the telephone, sending your provider a message by [facility] may be a faster and more efficient way to get a response.  Please allow 48 business hours for a response.  Please remember that this is for non-urgent requests.  ? ?Due to recent changes in healthcare laws, you may see the results of your imaging and laboratory studies on MyChart before your provider has had a chance to review them.  We understand that in some cases there may be results that are confusing or concerning to you. Not all laboratory results come back in the same  time frame and the provider may be waiting for multiple results in order to interpret others.  Please give [place] 48 hours in order for your provider to thoroughly review all the results before contacting the office for clarification of your results.  ? ?It was a pleasure to see you today! ? ?Thank you for trusting me with your gastrointestinal care!   ? ?[name], MD  ?

## 2021-06-19 NOTE — Telephone Encounter (Signed)
[facility] Pre-operative Risk Assessment  ?   ?Request for surgical clearance:     Endoscopy Procedure ? ?What type of surgery is being performed?     Colonoscopy and EGD  ? ?When is this surgery scheduled?     07/29/21 ? ?What type of clearance is required ?   Pharmacy ? ?Are there any medications that need to be held prior to surgery and how long? Plavix 7 days and Eliquis 2 days. ? ?Practice name and name of physician performing surgery?      [facility] ? ?What is your office phone and fax number?      Phone- [phone]  Fax- [phone] ? ?Anesthesia type (None, local, MAC, general) ?       MAC  ?

## 2021-06-19 NOTE — Telephone Encounter (Signed)
Chart reviewed for preop. ? ?Will route to pharm for Eliquis. ? ?Will route to MD for input on holding Plavix 7 day for colonoscopy per [facility] request.  Per chart, "pt has h/o early onset CAD s/p CABG, ischemic cardiomyopathy, and LV thrombus. Patient had initial MI in 2012 at age 36, PCI to LAD.  He had inferoposterior MI in 4/22.  LHC showed 3 vessel disease, and patient had CABG x 4. Cardiac MRI in 5/22 showed LV EF 27% with LV thrombus, there was significant viability.  Post-op, patient had GI bleeding and anticoagulation was stopped.  He quit smoking after CABG. Admitted 12/03/20 with NSTEMI. Coronary angiogram showed patent LIMA to LAD with occlusion of all other grafts. S/p PCI/DES to native mid LCX 99% stenosis.  Native LAD and RCA occluded. Angina resolved post PCI." ? ?Otherwise followed for CHF. [name] - Please route response to P CV DIV PREOP (the pre-op pool). Thank you. ? ?

## 2021-06-20 ENCOUNTER — Encounter (HOSPITAL_COMMUNITY)
Admission: RE | Admit: 2021-06-20 | Discharge: 2021-06-20 | Disposition: A | Discharge status: Home / Self Care | Payer: Medicaid Other | Source: Ambulatory Visit | Attending: Cardiology | Admitting: Cardiology

## 2021-06-20 DIAGNOSIS — I5022 Chronic systolic (congestive) heart failure: Secondary | ICD-10-CM

## 2021-06-20 NOTE — Telephone Encounter (Signed)
? ?  Primary Cardiologist: Loralie Champagne, MD ? ?Chart reviewed as part of pre-operative protocol coverage. Given past medical history and time since last visit, based on ACC/AHA guidelines, Wyatt Donovan would be at acceptable risk for the planned procedure without further cardiovascular testing.  ? ?His Plavix may be held for 5 days prior to his procedure. ? ?Patient with diagnosis of afib on Eliquis for anticoagulation.   ?  ?Procedure: colonoscopy and EGD ?Date of procedure: 07/29/21 ?  ?CHA2DS2-VASc Score = 2  ?This indicates a 2.2% annual risk of stroke. ?The patient's score is based upon: ?CHF History: 1 ?HTN History: 0 ?Diabetes History: 0 ?Stroke History: 0 ?Vascular Disease History: 1 ?Age Score: 0 ?Gender Score: 0 ?  ?LV thrombus noted on 06/2020 cardiac MRI, resolved on 11/2020 MRI. ?  ?CrCl >176m/min ?Platelet count 187K ?  ?Per office protocol, patient can hold Eliquis for 2 days prior to procedure as requested. ? ? ?I will route this recommendation to the requesting party via Epic fax function and remove from pre-op pool. ? ?Please call with questions. ? ?JJossie Ng Madelena Maturin NP-C ? ?  ?06/20/2021, 3:15 PM ?[id number] ?Office [phone] ([phone]? ? ? ? ?

## 2021-06-20 NOTE — Telephone Encounter (Signed)
Ok to hold Plavix 5 days for procedure.  ?

## 2021-06-20 NOTE — Progress Notes (Signed)
Daily Session Note ? ?Patient Details  ?Name: Wyatt Donovan ?MRN: [id number] ?Date of Birth: Aug 20, 1985 ?Referring Provider:   ?Flowsheet Row CARDIAC REHAB PHASE II ORIENTATION from 04/14/2021 in [place]  ?Referring Provider [name]  ? ?  ? ? ?Encounter Date: 06/20/2021 ? ?Check In: ? Session Check In - 06/20/21 0930   ? ?  ? Check-In  ? Supervising physician immediately available to respond to emergencies [facility] MD immediately available   ? Physician(s) Dr Harrington Challenger   ? Location [facility]   ? Staff Present Madelyn Flavors, RN, BSN;Christy Edwards, RN, BSN;Heather Otho Ket, BS, Exercise Physiologist   ? Virtual Visit No   ? Medication changes reported     No   ? Comments Started 05/22/2021 Entresto 21m-26mg po BID daily   ? Fall or balance concerns reported    Yes   ? Comments He uses a cane due to his balance, but he has not fallen. He reports getting off balance but not falling.   ? Tobacco Cessation No Change   ? Warm-up and Cool-down Performed as group-led instruction   ? Resistance Training Performed Yes   ? VAD Patient? No   ? PAD/SET Patient? No   ?  ? Pain Assessment  ? Currently in Pain? No/denies   ? Multiple Pain Sites No   ? ?  ?  ? ?  ? ? ?Capillary Blood Glucose: ?Results for orders placed or performed in visit on 06/19/21 (from the past 24 hour(s))  ?IgG     Status: None  ? Collection Time: 06/19/21  3:41 PM  ?Result Value Ref Range  ? IgG (Immunoglobin G), Serum 780 600 - 1,640 mg/dL  ?IBC + Ferritin     Status: None  ? Collection Time: 06/19/21  3:41 PM  ?Result Value Ref Range  ? Iron 101 42 - 165 ug/dL  ? Transferrin 268.0 212.0 - 360.0 mg/dL  ? Saturation Ratios 26.9 20.0 - 50.0 %  ? Ferritin 22.8 22.0 - 322.0 ng/mL  ? TIBC 375.2 250.0 - 450.0 mcg/dL  ?Lipase     Status: None  ? Collection Time: 06/19/21  3:41 PM  ?Result Value Ref Range  ? Lipase 14.0 11.0 - 59.0 U/L  ? ? ? ? ?Social History  ? ?Tobacco Use  ?Smoking Status Former  ? Packs/day: 1.00  ?  Years: 22.00  ? Pack years: 22.00  ? Types: Cigarettes, Cigars  ? Quit date: 06/28/2020  ? Years since quitting: 0.9  ?Smokeless Tobacco Never  ?Tobacco Comments  ? cigarettes stopped in 2020, swisher sweets started in 2018-04-05/day  ? ? ?Goals Met:  ?Independence with exercise equipment ?Exercise tolerated well ?No report of concerns or symptoms today ?Strength training completed today ? ?Goals Unmet:  ?Not Applicable ? ?Comments: Check out 1030. ? ? ?Dr. JCarlyle Dollyis Medical Director for [place]?

## 2021-06-20 NOTE — Telephone Encounter (Signed)
Patient with diagnosis of afib on Eliquis for anticoagulation.   ? ?Procedure: colonoscopy and EGD ?Date of procedure: 07/29/21 ? ?CHA2DS2-VASc Score = 2  ?This indicates a 2.2% annual risk of stroke. ?The patient's score is based upon: ?CHF History: 1 ?HTN History: 0 ?Diabetes History: 0 ?Stroke History: 0 ?Vascular Disease History: 1 ?Age Score: 0 ?Gender Score: 0 ?  ?LV thrombus noted on 06/2020 cardiac MRI, resolved on 11/2020 MRI. ? ?CrCl >167m/min ?Platelet count 187K ? ?Per office protocol, patient can hold Eliquis for 2 days prior to procedure as requested. ?

## 2021-06-23 ENCOUNTER — Encounter (HOSPITAL_COMMUNITY): Payer: Medicaid Other

## 2021-06-24 LAB — ALPHA-1-ANTITRYPSIN: A-1 Antitrypsin, Ser: 135 mg/dL (ref 83–199)

## 2021-06-24 LAB — MITOCHONDRIAL ANTIBODIES: Mitochondrial M2 Ab, IgG: <=20 U (ref <=20.0)

## 2021-06-24 LAB — IGG: IgG (Immunoglobin G), Serum: 780 mg/dL (ref 600–1640)

## 2021-06-25 ENCOUNTER — Encounter (HOSPITAL_COMMUNITY)
Admission: RE | Admit: 2021-06-25 | Discharge: 2021-06-25 | Disposition: A | Discharge status: Home / Self Care | Payer: Medicaid Other | Source: Ambulatory Visit | Attending: Cardiology | Admitting: Cardiology

## 2021-06-25 DIAGNOSIS — I5022 Chronic systolic (congestive) heart failure: Secondary | ICD-10-CM

## 2021-06-25 NOTE — Telephone Encounter (Signed)
Called patient and let him know that we received clearance for him to hold Eliquis 2 days prior to his procedure and plavix 5 days prior to his procedure. ?

## 2021-06-25 NOTE — Progress Notes (Signed)
Cardiac Individual Treatment Plan ? ?Patient Details  ?Name: Wyatt Donovan ?MRN: [id number] ?Date of Birth: [birth year] ?Referring Provider:   ?Flowsheet Row CARDIAC REHAB PHASE II ORIENTATION from 04/14/2021 in [place]  ?Referring Provider [name]  ? ?  ? ? ?Initial Encounter Date:  ?Flowsheet Row CARDIAC REHAB PHASE II ORIENTATION from 04/14/2021 in [place]  ?Date 04/14/21  ? ?  ? ? ?Visit Diagnosis: Chronic systolic CHF (congestive heart failure) ([place]) ? ?Patient's Home Medications on Admission: ? ?Current Outpatient Medications:  ?  amiodarone (PACERONE) 200 MG tablet, Take 1 tablet (200 mg total) by mouth daily., Disp: 30 tablet, Rfl: 6 ?  apixaban (ELIQUIS) 5 MG TABS tablet, Take 1 tablet (5 mg total) by mouth 2 (two) times daily., Disp: 60 tablet, Rfl: 6 ?  atorvastatin (LIPITOR) 80 MG tablet, Take 1 tablet (80 mg total) by mouth daily., Disp: 30 tablet, Rfl: 11 ?  carvedilol (COREG) 3.125 MG tablet, Take 1 tablet (3.125 mg total) by mouth 2 (two) times daily with a meal., Disp: 180 tablet, Rfl: 3 ?  clopidogrel (PLAVIX) 75 MG tablet, Take 1 tablet (75 mg total) by mouth daily., Disp: 30 tablet, Rfl: 6 ?  dapagliflozin propanediol (FARXIGA) 10 MG TABS tablet, Take 1 tablet (10 mg total) by mouth daily before breakfast., Disp: 30 tablet, Rfl: 11 ?  hydrOXYzine (ATARAX) 10 MG tablet, Take 10 mg by mouth at bedtime., Disp: , Rfl:  ?  nitroGLYCERIN (NITROSTAT) 0.4 MG SL tablet, Place 0.4 mg under the tongue every 5 (five) minutes x 3 doses as needed for chest pain., Disp: , Rfl:  ?  sacubitril-valsartan (ENTRESTO) 24-26 MG, Take 1 tablet by mouth 2 (two) times daily., Disp: 180 tablet, Rfl: 3 ?  sodium zirconium cyclosilicate (LOKELMA) 5 g packet, Take 5 g by mouth daily., Disp: 30 packet, Rfl: 6 ?  spironolactone (ALDACTONE) 25 MG tablet, Take 1 tablet (25 mg total) by mouth daily., Disp: 90 tablet, Rfl: 3 ? ?Past Medical History: ?Past Medical History:   ?Diagnosis Date  ? Atypical chest pain   ? CAD (coronary artery disease)   ? Dyslipidemia   ? Ex-smoker   ? History of depression   ? Insomnia   ? Ischemic cardiomyopathy   ? Ejection fraction 40-45%  ? NSTEMI (non-ST elevated myocardial infarction) ([place]) 08/2007  ? Treated with a bare metal stent to the proximal LAD  ? Suicide attempt [facility]) 01/2001  ? ? ?Tobacco Use: ?Social History  ? ?Tobacco Use  ?Smoking Status Former  ? Packs/day: 1.00  ? Years: 22.00  ? Pack years: 22.00  ? Types: Cigarettes, Cigars  ? Quit date: 06/28/2020  ? Years since quitting: 0.9  ?Smokeless Tobacco Never  ?Tobacco Comments  ? cigarettes stopped in 2020, swisher sweets started in 2018-04-05/day  ? ? ?Labs: ?Review Flowsheet   ? ?  ?  Latest Ref Rng & Units 07/06/2020 07/07/2020 07/08/2020 08/08/2020  ?Labs for ITP Cardiac and Pulmonary Rehab  ?Cholestrol 0 - 200 mg/dL    74    ?LDL (calc) 0 - 99 mg/dL    39    ?HDL-C >40 mg/dL    19    ?Trlycerides <150 mg/dL    78    ?O2 Saturation % 49.8   55.3   69.5     ? ?  12/04/2020  ?Labs for ITP Cardiac and Pulmonary Rehab  ?Cholestrol 94    ?LDL (calc) 45    ?HDL-C 25    ?  Trlycerides 118    ?O2 Saturation   ?  ? ? Multiple values from one day are sorted in reverse-chronological order  ?  ?  ? ? ?Capillary Blood Glucose: ?Lab Results  ?Component Value Date  ? GLUCAP 103 (H) 04/25/2021  ? GLUCAP 114 (H) 04/23/2021  ? GLUCAP 98 04/21/2021  ? GLUCAP 102 (H) 04/14/2021  ? GLUCAP 91 07/07/2020  ? ? POCT Glucose   ? ? [place] Name 04/14/21 1316  ?  ?  ?  ?  ?  ? POCT Blood Glucose  ? Pre-Exercise 102 mg/dL      ? ?  ?  ? ?  ? ? ?Exercise Target Goals: ?Exercise Program Goal: ?Individual exercise prescription set using results from initial 6 min walk test and THRR while considering  patient?s activity barriers and safety.  ? ?Exercise Prescription Goal: ?Starting with aerobic activity 30 plus minutes a day, 3 days per week for initial exercise prescription. Provide home exercise prescription and guidelines that  participant acknowledges understanding prior to discharge. ? ?Activity Barriers & Risk Stratification: ? Activity Barriers & Cardiac Risk Stratification - 04/14/21 1301   ? ?  ? Activity Barriers & Cardiac Risk Stratification  ? Activity Barriers Deconditioning;Decreased Ventricular Function;Assistive Device;Balance Concerns   ? Cardiac Risk Stratification High   ? ?  ?  ? ?  ? ? ?6 Minute Walk: ? 6 Minute Walk   ? ? [name] Name 04/14/21 1355  ?  ?  ?  ? 6 Minute Walk  ? Phase Initial    ? Distance 1100 feet    ? Walk Time 6 minutes    ? # of Rest Breaks 0    ? MPH 2.1    ? METS 4.44    ? RPE 10    ? VO2 Peak 15.54    ? Symptoms No    ? Resting HR 70 bpm    ? Resting BP 110/64    ? Resting Oxygen Saturation  97 %    ? Exercise Oxygen Saturation  during 6 min walk 96 %    ? Max Ex. HR 86 bpm    ? Max Ex. BP 120/65    ? 2 Minute Post BP 113/63    ? ?  ?  ? ?  ? ? ?Oxygen Initial Assessment: ? ? ?Oxygen Re-Evaluation: ? ? ?Oxygen Discharge (Final Oxygen Re-Evaluation): ? ? ?Initial Exercise Prescription: ? Initial Exercise Prescription - 04/14/21 1300   ? ?  ? Date of Initial Exercise RX and Referring Provider  ? Date 04/14/21   ? Referring Provider [name]   ? Expected Discharge Date 07/04/21   ?  ? Treadmill  ? MPH 1.2   ? Grade 0   ? Minutes 17   ?  ? [place] Elliptical  ? Level 1   ? Minutes 22   ?  ? Prescription Details  ? Frequency (times per week) 3   ? Duration Progress to 30 minutes of continuous aerobic without signs/symptoms of physical distress   ?  ? Intensity  ? THRR 40-80% of Max Heartrate 73-146   ? Ratings of Perceived Exertion 11-13   ? Perceived Dyspnea 0-4   ?  ? Resistance Training  ? Training Prescription Yes   ? Weight 3   ? Reps 10-15   ? ?  ?  ? ?  ? ? ?Perform Capillary Blood Glucose checks as needed. ? ?Exercise Prescription Changes: ? ? Exercise Prescription Changes   ? ?  [name] 04/14/21 1400 04/28/21 0900 04/28/21 1500 05/12/21 1024 05/26/21 1000  ?  ? Response to Exercise  ? Blood  Pressure (Admit) 110/64 -- 1'18/78 94/50 92/60 '$  ? Blood Pressure (Exercise) 120/65 -- 122/68 92/48 124/60  ? Blood Pressure (Exit) 116/6 -- 1'08/58 92/58 82/56 '$  ? Heart Rate (Admit) -- -- 78 bpm 69 bpm 67 bpm  ? Heart Rate (Exercise) -- -- 68 bpm 94 bpm 108 bpm  ? Heart Rate (Exit) -- -- 58 bpm 78 bpm 77 bpm  ? Rating of Perceived Exertion (Exercise) -- -- '11 12 12  '$ ? Duration -- -- Continue with 30 min of aerobic exercise without signs/symptoms of physical distress. Continue with 30 min of aerobic exercise without signs/symptoms of physical distress. Continue with 30 min of aerobic exercise without signs/symptoms of physical distress.  ? Intensity -- -- THRR unchanged THRR unchanged THRR unchanged  ?  ? Progression  ? Progression -- -- Continue to progress workloads to maintain intensity without signs/symptoms of physical distress. Continue to progress workloads to maintain intensity without signs/symptoms of physical distress. Continue to progress workloads to maintain intensity without signs/symptoms of physical distress.  ?  ? Resistance Training  ? Training Prescription -- -- Yes Yes Yes  ? Weight -- -- '3 4 4  '$ ? Reps -- -- 10-15 10-15 10-15  ? Time -- -- 10 Minutes 10 Minutes 10 Minutes  ?  ? Treadmill  ? MPH -- -- 1.5 2 2.3  ? Grade -- -- 0 0 0  ? Minutes -- -- '17 17 17  '$ ? METs -- -- 2.15 2.53 2.76  ?  ? Recumb[place] Elliptical  ? Level -- -- '2 2 2  '$ ? RPM -- -- 51 67 71  ? Minutes -- -- '22 22 22  '$ ? METs -- -- 3.2 4 4.4  ?  ? Home Exercise Plan  ? Plans to continue exercise at -- Home (comment) -- -- --  ? Frequency -- Add 2 additional days to program exercise sessions. -- -- --  ? Initial Home Exercises Provided -- 04/28/21 -- -- --  ? ? [place] Name 06/09/21 1200 06/20/21 1016  ?  ?  ?  ?  ? Response to Exercise  ? Blood Pressure (Admit) 86/50 82/42     ? Blood Pressure (Exercise) 90/58 92/48     ? Blood Pressure (Exit) 84/55 88/50     ? Heart Rate (Admit) 62 bpm 71 bpm     ? Heart Rate (Exercise) 99 bpm 101 bpm     ?  Heart Rate (Exit) 71 bpm 83 bpm     ? Rating of Perceived Exertion (Exercise) 12 12     ? Duration Continue with 30 min of aerobic exercise without signs/symptoms of physical distress. Continue with 30 min of aer

## 2021-06-25 NOTE — Progress Notes (Signed)
Daily Session Note ? ?Patient Details  ?Name: Wyatt Donovan ?MRN: [id number] ?Date of Birth: 1985-11-21 ?Referring Provider:   ?Flowsheet Row CARDIAC REHAB PHASE II ORIENTATION from 04/14/2021 in [place]  ?Referring Provider [name]  ? ?  ? ? ?Encounter Date: 06/25/2021 ? ?Check In: ? Session Check In - 06/25/21 0930   ? ?  ? Check-In  ? Supervising physician immediately available to respond to emergencies [facility] MD immediately available   ? Physician(s) Dr. Domenic Polite   ? Location [facility]   ? Staff Present [name], BS, Exercise Physiologist;Casidee Jann Kris Mouton, MS, ACSM-CEP, Exercise Physiologist   ? Virtual Visit No   ? Medication changes reported     No   ? Fall or balance concerns reported    Yes   ? Comments He uses a cane due to his balance, but he has not fallen. He reports getting off balance but not falling.   ? Tobacco Cessation No Change   ? Warm-up and Cool-down Performed as group-led instruction   ? Resistance Training Performed Yes   ? VAD Patient? No   ? PAD/SET Patient? No   ?  ? Pain Assessment  ? Currently in Pain? No/denies   ? Multiple Pain Sites No   ? ?  ?  ? ?  ? ? ?Capillary Blood Glucose: ?No results found for this or any previous visit (from the past 24 hour(s)). ? ? ? ?Social History  ? ?Tobacco Use  ?Smoking Status Former  ? Packs/day: 1.00  ? Years: 22.00  ? Pack years: 22.00  ? Types: Cigarettes, Cigars  ? Quit date: 06/28/2020  ? Years since quitting: 0.9  ?Smokeless Tobacco Never  ?Tobacco Comments  ? cigarettes stopped in 2020, swisher sweets started in 2018-04-05/day  ? ? ?Goals Met:  ?Independence with exercise equipment ?Exercise tolerated well ?No report of concerns or symptoms today ?Strength training completed today ? ?Goals Unmet:  ?Not Applicable ? ?Comments: checkout time is 1030 ? ? ?[name] is Medical Director for [place] ?

## 2021-06-25 NOTE — Progress Notes (Signed)
Cardiac Individual Treatment Plan ? ?Patient Details  ?Name: Wyatt Donovan ?MRN: [id number] ?Date of Birth: 12-14-1985 ?Referring Provider:   ?Flowsheet Row CARDIAC REHAB PHASE II ORIENTATION from 04/14/2021 in [place]  ?Referring Provider [name]  ? ?  ? ? ?Initial Encounter Date:  ?Flowsheet Row CARDIAC REHAB PHASE II ORIENTATION from 04/14/2021 in [place]  ?Date 04/14/21  ? ?  ? ? ?Visit Diagnosis: Chronic systolic CHF (congestive heart failure) ([place]) ? ?Patient's Home Medications on Admission: ? ?Current Outpatient Medications:  ?  amiodarone (PACERONE) 200 MG tablet, Take 1 tablet (200 mg total) by mouth daily., Disp: 30 tablet, Rfl: 6 ?  apixaban (ELIQUIS) 5 MG TABS tablet, Take 1 tablet (5 mg total) by mouth 2 (two) times daily., Disp: 60 tablet, Rfl: 6 ?  atorvastatin (LIPITOR) 80 MG tablet, Take 1 tablet (80 mg total) by mouth daily., Disp: 30 tablet, Rfl: 11 ?  carvedilol (COREG) 3.125 MG tablet, Take 1 tablet (3.125 mg total) by mouth 2 (two) times daily with a meal., Disp: 180 tablet, Rfl: 3 ?  clopidogrel (PLAVIX) 75 MG tablet, Take 1 tablet (75 mg total) by mouth daily., Disp: 30 tablet, Rfl: 6 ?  dapagliflozin propanediol (FARXIGA) 10 MG TABS tablet, Take 1 tablet (10 mg total) by mouth daily before breakfast., Disp: 30 tablet, Rfl: 11 ?  hydrOXYzine (ATARAX) 10 MG tablet, Take 10 mg by mouth at bedtime., Disp: , Rfl:  ?  nitroGLYCERIN (NITROSTAT) 0.4 MG SL tablet, Place 0.4 mg under the tongue every 5 (five) minutes x 3 doses as needed for chest pain., Disp: , Rfl:  ?  sacubitril-valsartan (ENTRESTO) 24-26 MG, Take 1 tablet by mouth 2 (two) times daily., Disp: 180 tablet, Rfl: 3 ?  sodium zirconium cyclosilicate (LOKELMA) 5 g packet, Take 5 g by mouth daily., Disp: 30 packet, Rfl: 6 ?  spironolactone (ALDACTONE) 25 MG tablet, Take 1 tablet (25 mg total) by mouth daily., Disp: 90 tablet, Rfl: 3 ? ?Past Medical History: ?Past Medical History:   ?Diagnosis Date  ? Atypical chest pain   ? CAD (coronary artery disease)   ? Dyslipidemia   ? Ex-smoker   ? History of depression   ? Insomnia   ? Ischemic cardiomyopathy   ? Ejection fraction 40-45%  ? NSTEMI (non-ST elevated myocardial infarction) ([place]) 08/2007  ? Treated with a bare metal stent to the proximal LAD  ? Suicide attempt [facility]) 01/2001  ? ? ?Tobacco Use: ?Social History  ? ?Tobacco Use  ?Smoking Status Former  ? Packs/day: 1.00  ? Years: 22.00  ? Pack years: 22.00  ? Types: Cigarettes, Cigars  ? Quit date: 06/28/2020  ? Years since quitting: 0.9  ?Smokeless Tobacco Never  ?Tobacco Comments  ? cigarettes stopped in 2020, swisher sweets started in 2018-04-05/day  ? ? ?Labs: ?Review Flowsheet   ? ?  ?  Latest Ref Rng & Units 07/06/2020 07/07/2020 07/08/2020 08/08/2020  ?Labs for ITP Cardiac and Pulmonary Rehab  ?Cholestrol 0 - 200 mg/dL    74    ?LDL (calc) 0 - 99 mg/dL    39    ?HDL-C >40 mg/dL    19    ?Trlycerides <150 mg/dL    78    ?O2 Saturation % 49.8   55.3   69.5     ? ?  12/04/2020  ?Labs for ITP Cardiac and Pulmonary Rehab  ?Cholestrol 94    ?LDL (calc) 45    ?HDL-C 25    ?  Trlycerides 118    ?O2 Saturation   ?  ? ? Multiple values from one day are sorted in reverse-chronological order  ?  ?  ? ? ?Capillary Blood Glucose: ?Lab Results  ?Component Value Date  ? GLUCAP 103 (H) 04/25/2021  ? GLUCAP 114 (H) 04/23/2021  ? GLUCAP 98 04/21/2021  ? GLUCAP 102 (H) 04/14/2021  ? GLUCAP 91 07/07/2020  ? ? POCT Glucose   ? ? [place] Name 04/14/21 1316  ?  ?  ?  ?  ?  ? POCT Blood Glucose  ? Pre-Exercise 102 mg/dL      ? ?  ?  ? ?  ? ? ?Exercise Target Goals: ?Exercise Program Goal: ?Individual exercise prescription set using results from initial 6 min walk test and THRR while considering  patient?s activity barriers and safety.  ? ?Exercise Prescription Goal: ?Starting with aerobic activity 30 plus minutes a day, 3 days per week for initial exercise prescription. Provide home exercise prescription and guidelines that  participant acknowledges understanding prior to discharge. ? ?Activity Barriers & Risk Stratification: ? Activity Barriers & Cardiac Risk Stratification - 04/14/21 1301   ? ?  ? Activity Barriers & Cardiac Risk Stratification  ? Activity Barriers Deconditioning;Decreased Ventricular Function;Assistive Device;Balance Concerns   ? Cardiac Risk Stratification High   ? ?  ?  ? ?  ? ? ?6 Minute Walk: ? 6 Minute Walk   ? ? [place] Name 04/14/21 1355  ?  ?  ?  ? 6 Minute Walk  ? Phase Initial    ? Distance 1100 feet    ? Walk Time 6 minutes    ? # of Rest Breaks 0    ? MPH 2.1    ? METS 4.44    ? RPE 10    ? VO2 Peak 15.54    ? Symptoms No    ? Resting HR 70 bpm    ? Resting BP 110/64    ? Resting Oxygen Saturation  97 %    ? Exercise Oxygen Saturation  during 6 min walk 96 %    ? Max Ex. HR 86 bpm    ? Max Ex. BP 120/65    ? 2 Minute Post BP 113/63    ? ?  ?  ? ?  ? ? ?Oxygen Initial Assessment: ? ? ?Oxygen Re-Evaluation: ? ? ?Oxygen Discharge (Final Oxygen Re-Evaluation): ? ? ?Initial Exercise Prescription: ? Initial Exercise Prescription - 04/14/21 1300   ? ?  ? Date of Initial Exercise RX and Referring Provider  ? Date 04/14/21   ? Referring Provider [name]   ? Expected Discharge Date 07/04/21   ?  ? Treadmill  ? MPH 1.2   ? Grade 0   ? Minutes 17   ?  ? Recumbant Elliptical  ? Level 1   ? Minutes 22   ?  ? Prescription Details  ? Frequency (times per week) 3   ? Duration Progress to 30 minutes of continuous aerobic without signs/symptoms of physical distress   ?  ? Intensity  ? THRR 40-80% of Max Heartrate 73-146   ? Ratings of Perceived Exertion 11-13   ? Perceived Dyspnea 0-4   ?  ? Resistance Training  ? Training Prescription Yes   ? Weight 3   ? Reps 10-15   ? ?  ?  ? ?  ? ? ?Perform Capillary Blood Glucose checks as needed. ? ?Exercise Prescription Changes: ? ? Exercise Prescription Changes   ? ?  [place] Name 04/14/21 1400 04/28/21 0900 04/28/21 1500 05/12/21 1024 05/26/21 1000  ?  ? Response to Exercise  ? Blood  Pressure (Admit) 110/64 -- 1'18/78 94/50 92/60 '$  ? Blood Pressure (Exercise) 120/65 -- 122/68 92/48 124/60  ? Blood Pressure (Exit) 116/6 -- 1'08/58 92/58 82/56 '$  ? Heart Rate (Admit) -- -- 78 bpm 69 bpm 67 bpm  ? Heart Rate (Exercise) -- -- 68 bpm 94 bpm 108 bpm  ? Heart Rate (Exit) -- -- 58 bpm 78 bpm 77 bpm  ? Rating of Perceived Exertion (Exercise) -- -- '11 12 12  '$ ? Duration -- -- Continue with 30 min of aerobic exercise without signs/symptoms of physical distress. Continue with 30 min of aerobic exercise without signs/symptoms of physical distress. Continue with 30 min of aerobic exercise without signs/symptoms of physical distress.  ? Intensity -- -- THRR unchanged THRR unchanged THRR unchanged  ?  ? Progression  ? Progression -- -- Continue to progress workloads to maintain intensity without signs/symptoms of physical distress. Continue to progress workloads to maintain intensity without signs/symptoms of physical distress. Continue to progress workloads to maintain intensity without signs/symptoms of physical distress.  ?  ? Resistance Training  ? Training Prescription -- -- Yes Yes Yes  ? Weight -- -- '3 4 4  '$ ? Reps -- -- 10-15 10-15 10-15  ? Time -- -- 10 Minutes 10 Minutes 10 Minutes  ?  ? Treadmill  ? MPH -- -- 1.5 2 2.3  ? Grade -- -- 0 0 0  ? Minutes -- -- '17 17 17  '$ ? METs -- -- 2.15 2.53 2.76  ?  ? Recumbant Elliptical  ? Level -- -- '2 2 2  '$ ? RPM -- -- 51 67 71  ? Minutes -- -- '22 22 22  '$ ? METs -- -- 3.2 4 4.4  ?  ? Home Exercise Plan  ? Plans to continue exercise at -- Home (comment) -- -- --  ? Frequency -- Add 2 additional days to program exercise sessions. -- -- --  ? Initial Home Exercises Provided -- 04/28/21 -- -- --  ? ? [place] Name 06/09/21 1200 06/20/21 1016  ?  ?  ?  ?  ? Response to Exercise  ? Blood Pressure (Admit) 86/50 82/42     ? Blood Pressure (Exercise) 90/58 92/48     ? Blood Pressure (Exit) 84/55 88/50     ? Heart Rate (Admit) 62 bpm 71 bpm     ? Heart Rate (Exercise) 99 bpm 101 bpm     ?  Heart Rate (Exit) 71 bpm 83 bpm     ? Rating of Perceived Exertion (Exercise) 12 12     ? Duration Continue with 30 min of aerobic exercise without signs/symptoms of physical distress. Continue with 30 min of aer

## 2021-06-27 ENCOUNTER — Encounter (HOSPITAL_COMMUNITY)
Admission: RE | Admit: 2021-06-27 | Discharge: 2021-06-27 | Disposition: A | Discharge status: Home / Self Care | Payer: Medicaid Other | Source: Ambulatory Visit | Attending: Cardiology | Admitting: Cardiology

## 2021-06-27 DIAGNOSIS — I5022 Chronic systolic (congestive) heart failure: Secondary | ICD-10-CM | POA: Diagnosis not present

## 2021-06-27 NOTE — Progress Notes (Signed)
Daily Session Note ? ?Patient Details  ?Name: Wyatt Donovan ?MRN: [id number] ?Date of Birth: December 09, 1985 ?Referring Provider:   ?Flowsheet Row CARDIAC REHAB PHASE II ORIENTATION from 04/14/2021 in [place]  ?Referring Provider [name]  ? ?  ? ? ?Encounter Date: 06/27/2021 ? ?Check In: ? Session Check In - 06/27/21 0930   ? ?  ? Check-In  ? Supervising physician immediately available to respond to emergencies [facility] MD immediately available   ? Physician(s) Dr. Domenic Polite   ? Location [facility] Pul[facility]   ? Staff Present [name], BS, Exercise Physiologist;Hutson Luft Wynetta Emery, RN, Bjorn Loser, MS, ACSM-CEP, Exercise Physiologist   ? Virtual Visit No   ? Medication changes reported     No   ? Fall or balance concerns reported    Yes   ? Comments He uses a cane due to his balance, but he has not fallen. He reports getting off balance but not falling.   ? Tobacco Cessation No Change   ? Warm-up and Cool-down Performed as group-led instruction   ? Resistance Training Performed Yes   ? VAD Patient? No   ? PAD/SET Patient? No   ?  ? Pain Assessment  ? Currently in Pain? No/denies   ? Multiple Pain Sites No   ? ?  ?  ? ?  ? ? ?Capillary Blood Glucose: ?No results found for this or any previous visit (from the past 24 hour(s)). ? ? ? ?Social History  ? ?Tobacco Use  ?Smoking Status Former  ? Packs/day: 1.00  ? Years: 22.00  ? Pack years: 22.00  ? Types: Cigarettes, Cigars  ? Quit date: 06/28/2020  ? Years since quitting: 0.9  ?Smokeless Tobacco Never  ?Tobacco Comments  ? cigarettes stopped in 2020, swisher sweets started in 2018-04-05/day  ? ? ?Goals Met:  ?Independence with exercise equipment ?Exercise tolerated well ?No report of concerns or symptoms today ?Strength training completed today ? ?Goals Unmet:  ?Not Applicable ? ?Comments: Check out 1030. ? ? ?[name] is Medical Director for [place] ?

## 2021-06-30 ENCOUNTER — Encounter (HOSPITAL_COMMUNITY): Payer: Medicaid Other

## 2021-07-01 ENCOUNTER — Ambulatory Visit (HOSPITAL_COMMUNITY)
Admission: RE | Admit: 2021-07-01 | Discharge: 2021-07-01 | Disposition: A | Discharge status: Home / Self Care | Payer: Medicaid Other | Source: Ambulatory Visit | Attending: Internal Medicine | Admitting: Internal Medicine

## 2021-07-01 DIAGNOSIS — R7989 Other specified abnormal findings of blood chemistry: Secondary | ICD-10-CM | POA: Insufficient documentation

## 2021-07-01 DIAGNOSIS — R1030 Lower abdominal pain, unspecified: Secondary | ICD-10-CM | POA: Diagnosis present

## 2021-07-01 DIAGNOSIS — Z1211 Encounter for screening for malignant neoplasm of colon: Secondary | ICD-10-CM | POA: Insufficient documentation

## 2021-07-01 DIAGNOSIS — K625 Hemorrhage of anus and rectum: Secondary | ICD-10-CM | POA: Diagnosis not present

## 2021-07-01 DIAGNOSIS — R1013 Epigastric pain: Secondary | ICD-10-CM | POA: Insufficient documentation

## 2021-07-01 IMAGING — CT CT ABD-PELV W/ CM
2 of 4 series · 16 of 46 positions shown, 18 images · IV contrast (OMNIPAQUE)
Comparison: None Available.

CLINICAL DATA: Lower abdominal pain, Epigastric pain , Elevated
LFTs

EXAM:
CT ABDOMEN AND PELVIS WITH CONTRAST
TECHNIQUE: Multidetector CT imaging of the abdomen and pelvis was performed
using the standard protocol following bolus administration of
intravenous contrast.

[Series 2: axial st · axial · 0.82mm/px · z∈[-634,-184]mm · 13 of 104 slices shown, 15 images]
[im 7/104  soft-tissue]
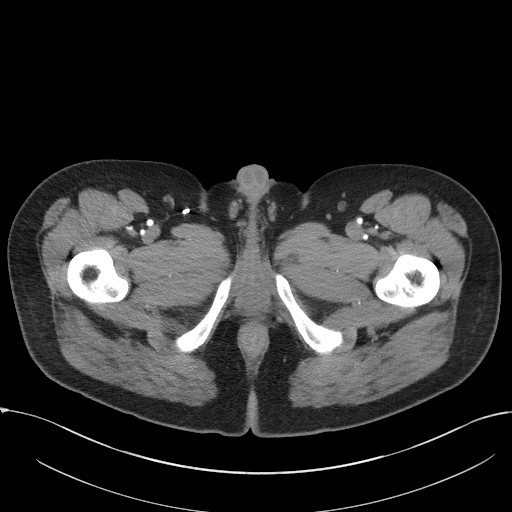
[im 7/104  bone]
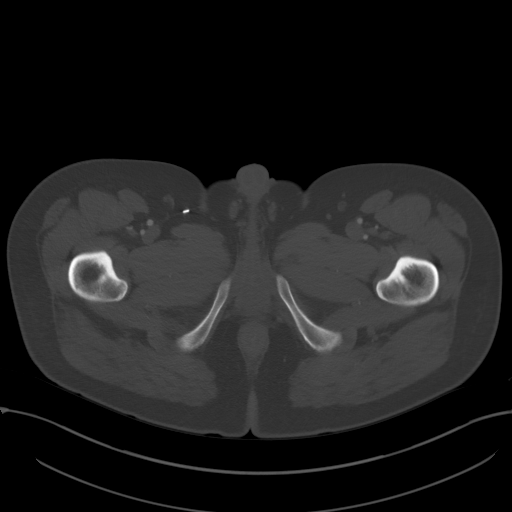
[im 13/104  soft-tissue]
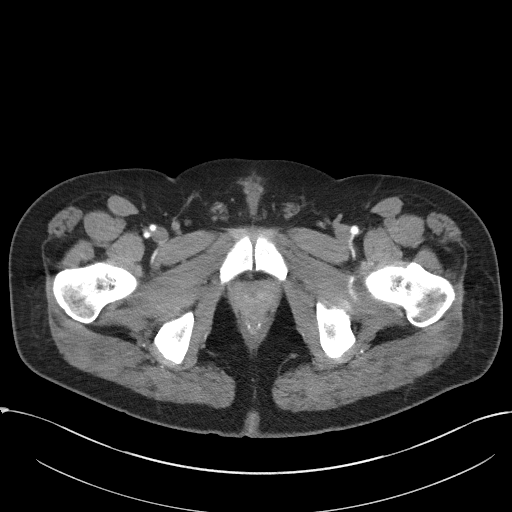
[im 25/104  soft-tissue]
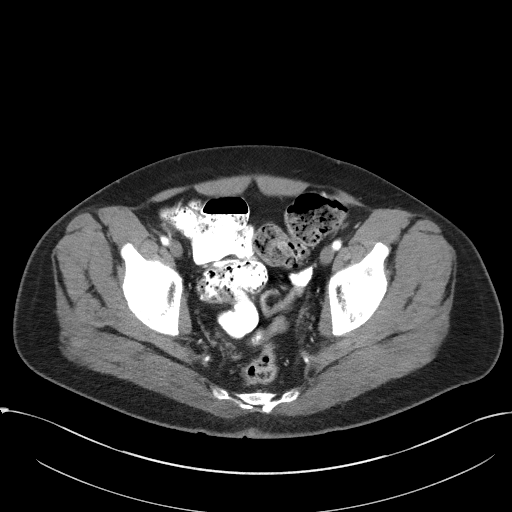
[im 31/104  soft-tissue]
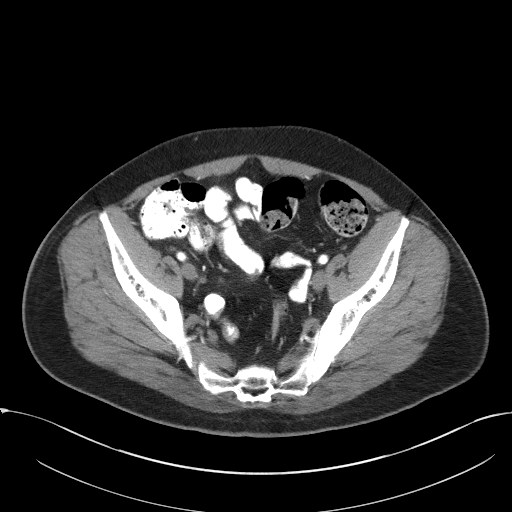
[im 37/104  soft-tissue]
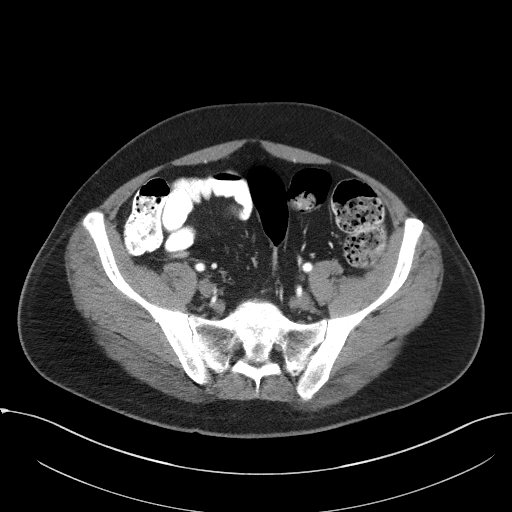
[im 43/104  soft-tissue]
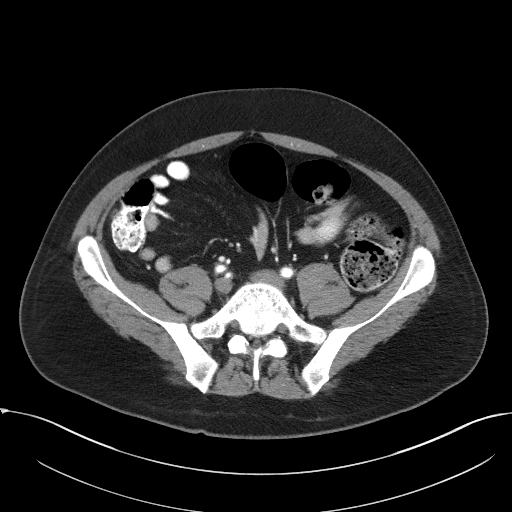
[im 55/104  soft-tissue]
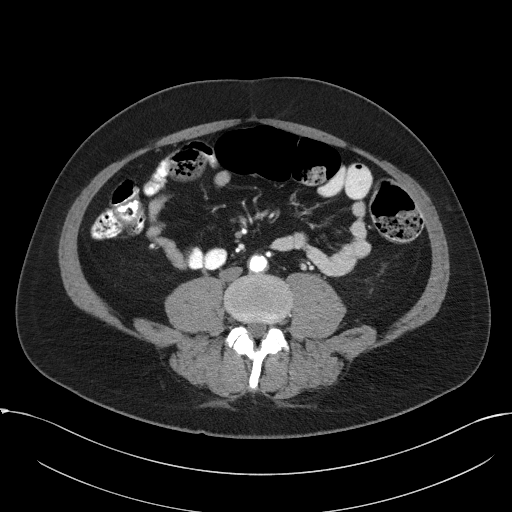
[im 61/104  soft-tissue]
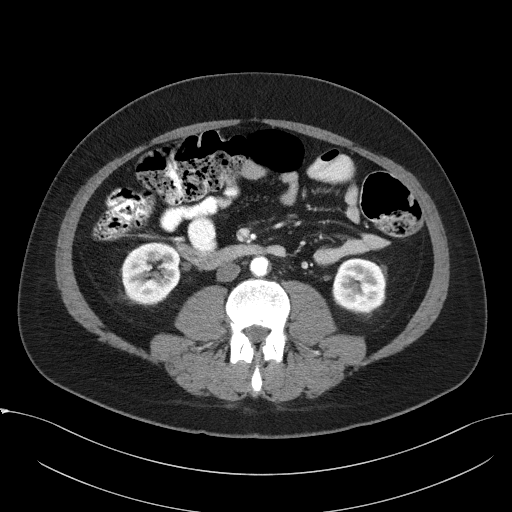
[im 67/104  soft-tissue]
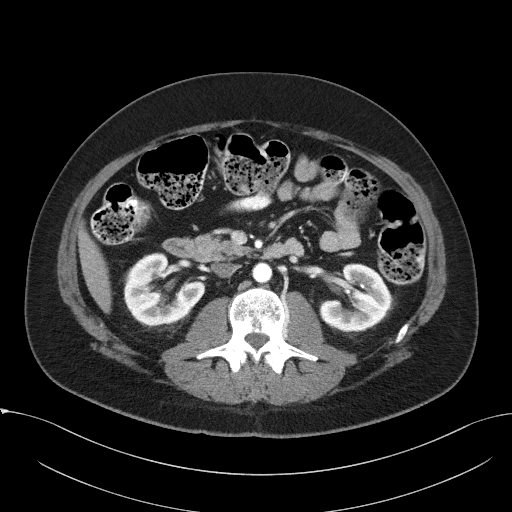
[im 67/104  bone]
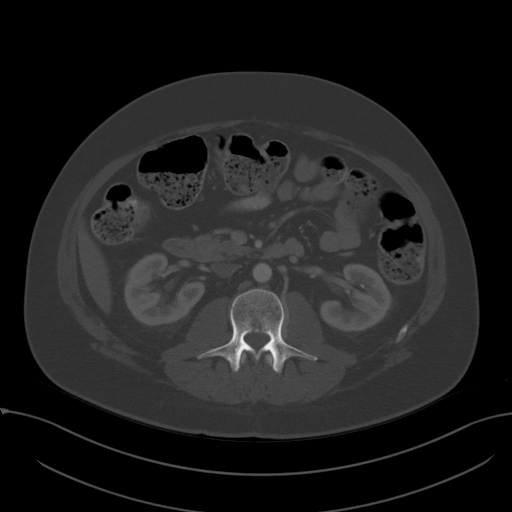
[im 73/104  soft-tissue]
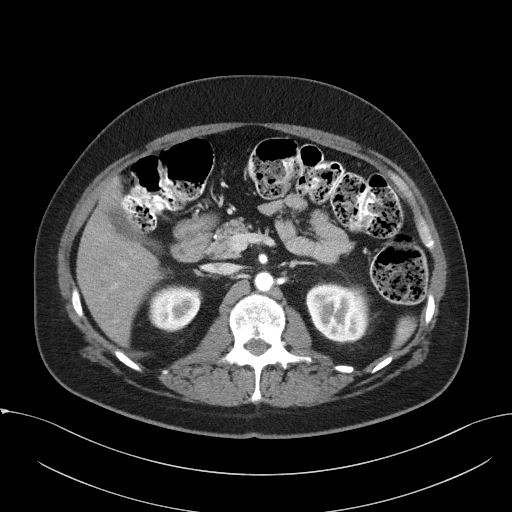
[im 79/104  soft-tissue]
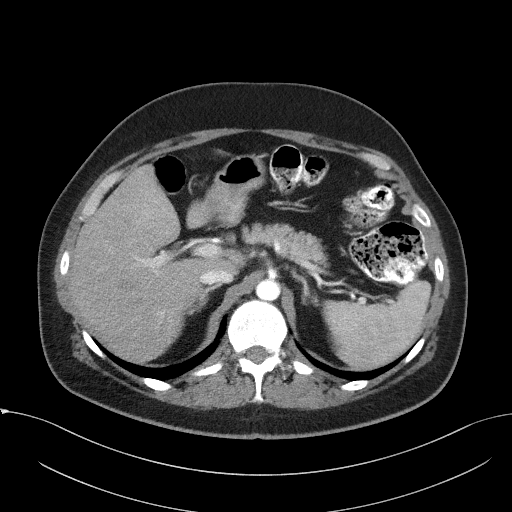
[im 91/104  soft-tissue]
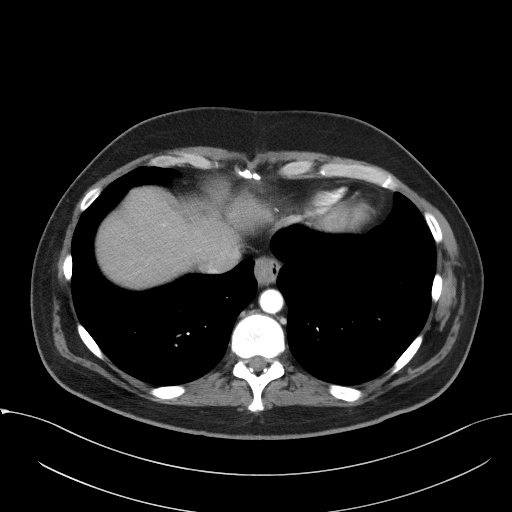
[im 97/104  soft-tissue]
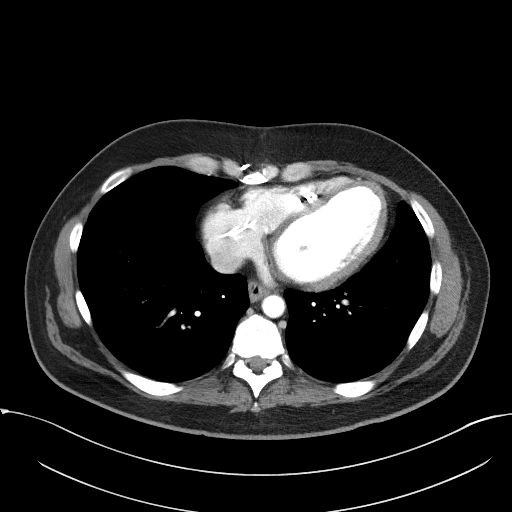

[Series 5: coronal st · coronal · 0.74mm/px · 3 of 101 slices shown]
[im 34/101  soft-tissue]
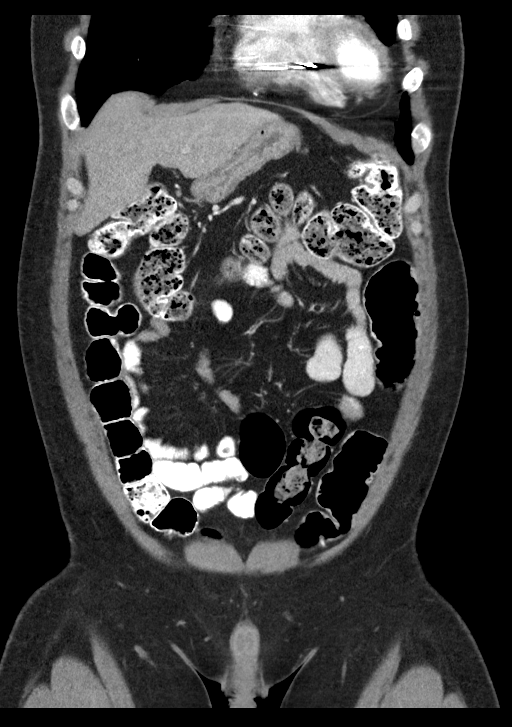
[im 45/101  soft-tissue]
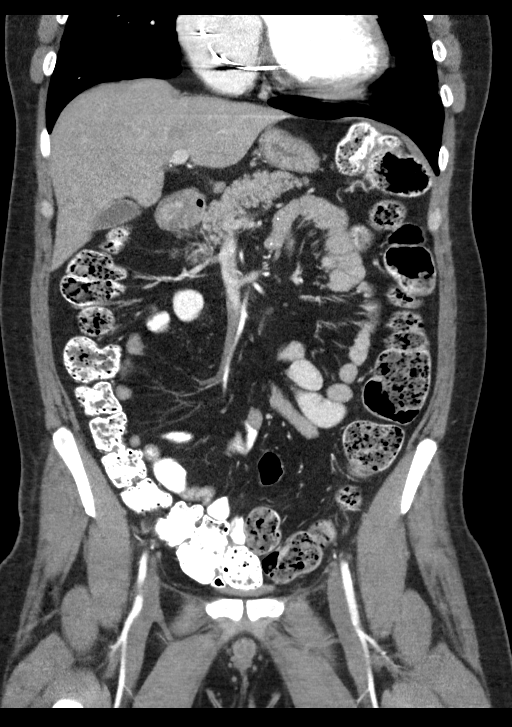
[im 56/101  soft-tissue]
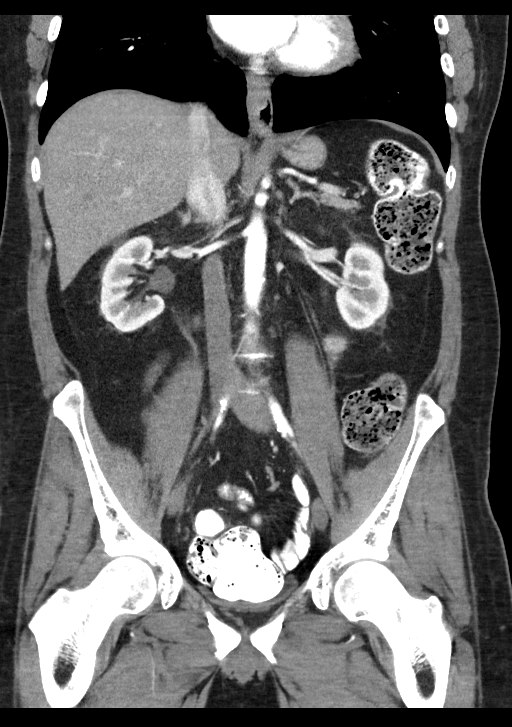

[16 of 46 positions shown; findings below may reference images not displayed]

RADIATION DOSE REDUCTION: This exam was performed according to the
departmental dose-optimization program which includes automated
exposure control, adjustment of the mA and/or kV according to
patient size and/or use of iterative reconstruction technique.

CONTRAST:  100mL OMNIPAQUE IOHEXOL 300 MG/ML  SOLN
FINDINGS: Lower chest: Pacer wires in the right heart. Heart borderline in
size. No acute abnormality.

Hepatobiliary: No focal hepatic abnormality. Gallbladder
unremarkable.

Pancreas: No focal abnormality or ductal dilatation.

Spleen: No focal abnormality.  Normal size.

Adrenals/Urinary Tract: No adrenal abnormality. No focal renal
abnormality. No stones or hydronephrosis. Urinary bladder is
unremarkable.

Stomach/Bowel: Large stool burden and moderate gaseous distention
throughout the colon. Normal appendix. Stomach and small bowel
decompressed, unremarkable.

Vascular/Lymphatic: No evidence of aneurysm or adenopathy.

Reproductive: No visible focal abnormality.

Other: No free fluid or free air.

Musculoskeletal: No acute bony abnormality.
IMPRESSION: Large stool burden and gaseous distention throughout the colon.

No acute findings in the abdomen or pelvis.

## 2021-07-01 MED ORDER — SODIUM CHLORIDE (PF) 0.9 % IJ SOLN
INTRAMUSCULAR | Status: AC
  Filled 2021-07-01: qty 50

## 2021-07-01 MED ORDER — IOHEXOL 300 MG/ML  SOLN
100.0000 mL | Freq: Once | INTRAMUSCULAR | Status: AC | PRN
  Administered 2021-07-01: 100 mL via INTRAVENOUS

## 2021-07-02 ENCOUNTER — Encounter (HOSPITAL_COMMUNITY)
Admission: RE | Admit: 2021-07-02 | Discharge: 2021-07-02 | Disposition: A | Discharge status: Home / Self Care | Payer: Medicaid Other | Source: Ambulatory Visit | Attending: Cardiology | Admitting: Cardiology

## 2021-07-02 DIAGNOSIS — I5022 Chronic systolic (congestive) heart failure: Secondary | ICD-10-CM | POA: Insufficient documentation

## 2021-07-02 NOTE — Progress Notes (Signed)
? ?Advanced Heart Failure Clinic Note ? ? ?Primary Care: Leonie Douglas, MD ?HF Cardiologist: [name] ?EP: Dr Quentin Ore  ? ?HPI: ?Patient is a 36 y.o. with history of early onset CAD s/p CABG, ischemic cardiomyopathy, and LV thrombus. Patient had initial MI in 2012 at age 22, PCI to LAD.  He had inferoposterior MI in 4/22.  LHC showed 3 vessel disease, and patient had CABG x 4. Cardiac MRI in 5/22 showed LV EF 27% with LV thrombus, there was significant viability.  Post-op, patient had GI bleeding and anticoagulation was stopped.  He quit smoking after CABG.  ?  ?Follow up 10/04/20 carvedilol decreased due to dizziness and repeat echo arranged.  Echo 8/22 EF 25-30%. He was referred to EP for ICD consideration. ?  ?Admitted 12/03/20 with NSTEMI. Coronary angiogram showed patent LIMA to LAD with occlusion of all other grafts. S/p PCI/DES to native mid LCX 99% stenosis.  Native LAD and RCA occluded. Angina resolved post PCI. Farxiga and Coreg added back, however spiro and losartan held due to hyperkalemia during this admission. His hospitalization was complicated by atrial fibrillation and flutter with RVR. He received IV amiodarone and started on Eliquis.  He underwent DCCV with conversion to NSR on 12/06/20. Discharged home 12/06/20, weight 177.5 lbs. ? ?Today he returns for HF follow up.Last visit he was started on entresto. Overall feeling tired. Complaining of bloody bowel movements. Says bleeding started 1 month ago. Saw GI and has colonoscopy 07/30/21.  Has some SOB when exercising at Cardiac Rehab. Denies PND/Orthopnea. Appetite ok. No fever or chills. Weight at home stable. Taking all medications. Lives with his Dad.  ? 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 ?Labs (10/22): K 4.5, creatinine 0.95, LDL 45 ?Labs (3/23): K 4.2, creatinine 0.94 ? ?PMH: ?1. Hyperlipidemia ?2. CAD: MI at age 16 in 2012, PCI to LAD.   ?- Inferoposterior MI in 4/22: LHC showed 3 vessel disease.  CABG with LIMA-LAD,  radial to OM1, sequential SVG-PDA and D1.  ?- NSTEMI-->LHC (10/22): patent LIMA to LAD, all other grafts occluded. S/p PCI DES native mid LCx 99% stenosis, LAD and RCA totally occluded.  ?3. Post-operative GI bleeding after CABG ?4. Chronic systolic CHF: Ischemic cardiomyopathy.   ?- Cardiac MRI (5/22): LV EF 27%, RV EF 57%, apical thrombus, extensive viability.  ?- Echo (8/22) EF 20-25% ?- Echo (10/22): EF 35-40% ?- cMRI (10/22): LVEF 29%, RV EF 45%, no LV thrombus, LGE suggestive of prior MI.  ?- [employer] ICD  ?5. LV thrombus  ?- No thrombus on 10/22 cMRI.  ?6. Prior smoker: Quit 5/22.  ?7. H/o hyperkalemia ?8. Atrial fibrillation/flutter: Paroxysmal.  ?- DCCV 10/22 ?9. ABIs (5/22): Normal.  ? ?Current Outpatient Medications  ?Medication Sig Dispense Refill  ? amiodarone (PACERONE) 200 MG tablet Take 1 tablet (200 mg total) by mouth daily. 30 tablet 6  ? apixaban (ELIQUIS) 5 MG TABS tablet Take 1 tablet (5 mg total) by mouth 2 (two) times daily. 60 tablet 6  ? atorvastatin (LIPITOR) 80 MG tablet Take 1 tablet (80 mg total) by mouth daily. 30 tablet 11  ? carvedilol (COREG) 3.125 MG tablet Take 1 tablet (3.125 mg total) by mouth 2 (two) times daily with a meal. 180 tablet 3  ? clopidogrel (PLAVIX) 75 MG tablet Take 1 tablet (75 mg total) by mouth daily. 30 tablet 6  ? dapagliflozin propanediol (FARXIGA) 10 MG TABS tablet Take 1 tablet (10 mg total) by mouth daily before breakfast. 30 tablet 11  ?  hydrOXYzine (ATARAX) 10 MG tablet Take 10 mg by mouth at bedtime.    ? nitroGLYCERIN (NITROSTAT) 0.4 MG SL tablet Place 0.4 mg under the tongue every 5 (five) minutes x 3 doses as needed for chest pain.    ? sacubitril-valsartan (ENTRESTO) 24-26 MG Take 1 tablet by mouth 2 (two) times daily. 180 tablet 3  ? sodium zirconium cyclosilicate (LOKELMA) 5 g packet Take 5 g by mouth daily. 30 packet 6  ? spironolactone (ALDACTONE) 25 MG tablet Take 1 tablet (25 mg total) by mouth daily. 90 tablet 3  ? ?No current  facility-administered medications for this encounter.  ? ?Allergies  ?Allergen Reactions  ? Codeine Itching  ? ?Social History  ? ?Socioeconomic History  ? Marital status: Single  ?  Spouse name: Not on file  ? Number of children: Not on file  ? Years of education: Not on file  ? Highest education level: High school graduate  ?Occupational History  ? Occupation: Not actively working  ?  Comment: applied for disability x 2.   ?Tobacco Use  ? Smoking status: Former  ?  Packs/day: 1.00  ?  Years: 22.00  ?  Pack years: 22.00  ?  Types: Cigarettes, Cigars  ?  Quit date: 06/28/2020  ?  Years since quitting: 1.0  ? Smokeless tobacco: Never  ? Tobacco comments:  ?  cigarettes stopped in 2020, swisher sweets started in 2018-04-05/day  ?Vaping Use  ? Vaping Use: Never used  ?Substance and Sexual Activity  ? Alcohol use: Never  ? Drug use: Not Currently  ?  Types: Marijuana  ? Sexual activity: Never  ?Other Topics Concern  ? Not on file  ?Social History Narrative  ? Single  ? Lives with his parents  ? Trying to get his GED  ? DeGent date all cultures  ? ?Social Determinants of Health  ? ?Financial Resource Strain: Medium Risk  ? Difficulty of Paying Living Expenses: Somewhat hard  ?Food Insecurity: No Food Insecurity  ? Worried About Charity fundraiser in the Last Year: Never true  ? Ran Out of Food in the Last Year: Never true  ?Transportation Needs: No Transportation Needs  ? Lack of Transportation (Medical): No  ? Lack of Transportation (Non-Medical): No  ?Physical Activity: Not on file  ?Stress: Not on file  ?Social Connections: Not on file  ?Intimate Partner Violence: Not on file  ? ?Family History  ?Problem Relation Age of Onset  ? Colon cancer Father   ? Colon cancer Paternal Grandfather   ? Colon cancer Paternal Aunt   ? Hypertension Neg Hx   ? Diabetes Neg Hx   ? Coronary artery disease Neg Hx   ? ?BP (!) 82/60   Pulse 63   Wt 88.6 kg (195 lb 6.4 oz)   SpO2 97%   BMI 25.78 kg/m?  ? ?Wt Readings from Last 3  Encounters:  ?07/03/21 88.6 kg (195 lb 6.4 oz)  ?06/19/21 87.1 kg (192 lb)  ?06/17/21 87.1 kg (192 lb)  ? ?General:  Thin, pale.  No resp difficulty ?HEENT: normal ?Neck: supple. no JVD. Carotids 2+ bilat; no bruits. No lymphadenopathy or thryomegaly appreciated. ?Cor: PMI nondisplaced. Regular rate & rhythm. No rubs, gallops or murmurs. ?Lungs: clear ?Abdomen: soft, nontender, nondistended. No hepatosplenomegaly. No bruits or masses. Good bowel sounds. ?Extremities: no cyanosis, clubbing, rash, edema ?Neuro: alert & orientedx3, cranial nerves grossly intact. moves all 4 extremities w/o difficulty. Affect pleasant ? ? ?ASSESSMENT & PLAN:  ?  1. CAD: early onset CAD s/p MI with LAD PCI in 2012,followed by CABG  x 4 4/22  Admit 10/22 with NSTEMI. LHC this admission with patent LIMA to LAD, all other grafts occluded. S/p PCI DES native mid Lcx 99% stenosis. No chest pain.  ?- Continue Plavix at least 6 months, ideally 1 year. May need to stop with hematochezia. ?-For now will hold eliquis x 7 days if bleeding stops can restart.  ?- Continue atorvastatin 80 mg, good lipids in 10/22.  ?2. Chronic Systolic CHF: Ischemic cardiomyopathy.  [place].  Most recent study was a cardiac MRI in 10/22 with LVEF 29% and RVEF 45%.   ?- NYHA II-III confounded by anemia. Appears dry.  ?- Continue Coreg 3.125 mg bid. ?- Continue Farxiga 10 mg daily.  ?- Continue spironolactone 25 mg daily + Lokelma 5 g daily given history of hyperkalemia.   ?- Continue Entresto 24/26 bid.  ?3. 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 Anticoagulation previously stopped d/t GI bleed. No thrombus on echo 08/22. No thrombus on cMRI 12/06/20. ?May need to stop with hematochezia.For now will hold eliquis x 7 days if bleeding stops can restart.  ?4. Hyperkalemia: He has had a tendency towards hyperkalemia.  I think that he will need Lokelma in order to take spironolactone and Entresto.  See above.  ?5. Atrial flutter/fibrillation: Both arrhythmias noted in  10/22.  He tolerated poorly with worsened symptoms.  He had DCCV in 10/22.  He is maintaining NSR on amiodarone, but amiodarone not ideal in a patient this young.  He saw Dr. Quentin Ore who wants to see clinical stability for 6

## 2021-07-02 NOTE — Progress Notes (Signed)
Daily Session Note ? ?Patient Details  ?Name: Wyatt Donovan ?MRN: [id number] ?Date of Birth: 1985-11-24 ?Referring Provider:   ?Flowsheet Row CARDIAC REHAB PHASE II ORIENTATION from 04/14/2021 in [place]  ?Referring Provider [name]  ? ?  ? ? ?Encounter Date: 07/02/2021 ? ?Check In: ? Session Check In - 07/02/21 0930   ? ?  ? Check-In  ? Supervising physician immediately available to respond to emergencies [facility] MD immediately available   ? Physician(s) Dr. Gardiner Rhyme   ? Location [facility]   ? Staff Present Geanie Cooley, RN;Dalton Kris Mouton, MS, ACSM-CEP, Exercise Physiologist;Heather Zigmund Daniel, Exercise Physiologist   ? Virtual Visit No   ? Medication changes reported     No   ? Fall or balance concerns reported    Yes   ? Comments He uses a cane due to his balance, but he has not fallen. He reports getting off balance but not falling.   ? Tobacco Cessation No Change   ? Warm-up and Cool-down Performed as group-led instruction   ? Resistance Training Performed Yes   ? VAD Patient? No   ? PAD/SET Patient? No   ?  ? Pain Assessment  ? Currently in Pain? No/denies   ? Multiple Pain Sites No   ? ?  ?  ? ?  ? ? ?Capillary Blood Glucose: ?No results found for this or any previous visit (from the past 24 hour(s)). ? ? ? ?Social History  ? ?Tobacco Use  ?Smoking Status Former  ? Packs/day: 1.00  ? Years: 22.00  ? Pack years: 22.00  ? Types: Cigarettes, Cigars  ? Quit date: 06/28/2020  ? Years since quitting: 1.0  ?Smokeless Tobacco Never  ?Tobacco Comments  ? cigarettes stopped in 2020, swisher sweets started in 2018-04-05/day  ? ? ?Goals Met:  ?Independence with exercise equipment ?Exercise tolerated well ?No report of concerns or symptoms today ?Strength training completed today ? ?Goals Unmet:  ?Not Applicable ? ?Comments: check out @ 10:30am ? ? ?[name] is Medical Director for [place] ?

## 2021-07-03 ENCOUNTER — Ambulatory Visit (HOSPITAL_COMMUNITY)
Admission: RE | Admit: 2021-07-03 | Discharge: 2021-07-03 | Disposition: A | Discharge status: Home / Self Care | Payer: Medicaid Other | Source: Ambulatory Visit | Attending: Adult Health | Admitting: Adult Health

## 2021-07-03 ENCOUNTER — Encounter (HOSPITAL_COMMUNITY): Payer: Self-pay

## 2021-07-03 VITALS — BP 82/60 | HR 63 | Wt 195.4 lb

## 2021-07-03 DIAGNOSIS — Z8679 Personal history of other diseases of the circulatory system: Secondary | ICD-10-CM | POA: Diagnosis not present

## 2021-07-03 DIAGNOSIS — Z7901 Long term (current) use of anticoagulants: Secondary | ICD-10-CM | POA: Diagnosis not present

## 2021-07-03 DIAGNOSIS — Z7984 Long term (current) use of oral hypoglycemic drugs: Secondary | ICD-10-CM | POA: Insufficient documentation

## 2021-07-03 DIAGNOSIS — I2510 Atherosclerotic heart disease of native coronary artery without angina pectoris: Secondary | ICD-10-CM | POA: Diagnosis not present

## 2021-07-03 DIAGNOSIS — I255 Ischemic cardiomyopathy: Secondary | ICD-10-CM | POA: Diagnosis not present

## 2021-07-03 DIAGNOSIS — I25119 Atherosclerotic heart disease of native coronary artery with unspecified angina pectoris: Secondary | ICD-10-CM

## 2021-07-03 DIAGNOSIS — Z955 Presence of coronary angioplasty implant and graft: Secondary | ICD-10-CM | POA: Insufficient documentation

## 2021-07-03 DIAGNOSIS — D649 Anemia, unspecified: Secondary | ICD-10-CM | POA: Diagnosis not present

## 2021-07-03 DIAGNOSIS — I25810 Atherosclerosis of coronary artery bypass graft(s) without angina pectoris: Secondary | ICD-10-CM | POA: Insufficient documentation

## 2021-07-03 DIAGNOSIS — Z79899 Other long term (current) drug therapy: Secondary | ICD-10-CM | POA: Insufficient documentation

## 2021-07-03 DIAGNOSIS — I252 Old myocardial infarction: Secondary | ICD-10-CM | POA: Insufficient documentation

## 2021-07-03 DIAGNOSIS — I480 Paroxysmal atrial fibrillation: Secondary | ICD-10-CM | POA: Diagnosis not present

## 2021-07-03 DIAGNOSIS — Z87891 Personal history of nicotine dependence: Secondary | ICD-10-CM | POA: Insufficient documentation

## 2021-07-03 DIAGNOSIS — Z951 Presence of aortocoronary bypass graft: Secondary | ICD-10-CM | POA: Insufficient documentation

## 2021-07-03 DIAGNOSIS — I4892 Unspecified atrial flutter: Secondary | ICD-10-CM | POA: Diagnosis not present

## 2021-07-03 DIAGNOSIS — I5022 Chronic systolic (congestive) heart failure: Secondary | ICD-10-CM | POA: Diagnosis not present

## 2021-07-03 DIAGNOSIS — Z8719 Personal history of other diseases of the digestive system: Secondary | ICD-10-CM

## 2021-07-03 DIAGNOSIS — K921 Melena: Secondary | ICD-10-CM | POA: Diagnosis not present

## 2021-07-03 DIAGNOSIS — Z8249 Family history of ischemic heart disease and other diseases of the circulatory system: Secondary | ICD-10-CM | POA: Diagnosis not present

## 2021-07-03 DIAGNOSIS — Z800 Family history of malignant neoplasm of digestive organs: Secondary | ICD-10-CM | POA: Diagnosis not present

## 2021-07-03 DIAGNOSIS — E875 Hyperkalemia: Secondary | ICD-10-CM | POA: Insufficient documentation

## 2021-07-03 DIAGNOSIS — Z7902 Long term (current) use of antithrombotics/antiplatelets: Secondary | ICD-10-CM | POA: Insufficient documentation

## 2021-07-03 LAB — CBC
HCT: 47 % (ref 39.0–52.0)
Hemoglobin: 15.2 g/dL (ref 13.0–17.0)
MCH: 31.3 pg (ref 26.0–34.0)
MCHC: 32.3 g/dL (ref 30.0–36.0)
MCV: 96.9 fL (ref 80.0–100.0)
Platelets: 193 10*3/uL (ref 150–400)
RBC: 4.85 MIL/uL (ref 4.22–5.81)
RDW: 14.5 % (ref 11.5–15.5)
WBC: 6.5 10*3/uL (ref 4.0–10.5)
nRBC: 0 % (ref 0.0–0.2)

## 2021-07-03 LAB — BASIC METABOLIC PANEL
Anion gap: 8 (ref 5–15)
BUN: 21 mg/dL — ABNORMAL HIGH (ref 6–20)
CO2: 21 mmol/L — ABNORMAL LOW (ref 22–32)
Calcium: 9 mg/dL (ref 8.9–10.3)
Chloride: 114 mmol/L — ABNORMAL HIGH (ref 98–111)
Creatinine, Ser: 1.19 mg/dL (ref 0.61–1.24)
GFR, Estimated: >60 mL/min (ref >60)
Glucose, Bld: 104 mg/dL — ABNORMAL HIGH (ref 70–99)
Potassium: 4.6 mmol/L (ref 3.5–5.1)
Sodium: 143 mmol/L (ref 135–145)

## 2021-07-03 MED ORDER — CLOPIDOGREL BISULFATE 75 MG PO TABS: 75.0000 mg | ORAL_TABLET | Freq: Every day | ORAL | 6 refills | Status: DC

## 2021-07-03 NOTE — Patient Instructions (Addendum)
Hold apixiban (Eliquis) for 7 days; re-start if bleeding has stopped. ?Return to see [name] in 6 weeks. Will call you with appointment. ?Will call you if lab results are abnormal today.  ?Please call clinic if any issues or questions arise.  ?

## 2021-07-04 ENCOUNTER — Encounter (HOSPITAL_COMMUNITY): Payer: Medicaid Other

## 2021-07-04 NOTE — Progress Notes (Signed)
Discharge Progress Report ? ?Patient Details  ?Name: Wyatt Donovan ?MRN: [id number] ?Date of Birth: 09-23-1985 ?Referring Provider:   ?Flowsheet Row CARDIAC REHAB PHASE II ORIENTATION from 04/14/2021 in [facility]  ?Referring Provider [name]  ? ?  ? ? ? ?Number of Visits: 27 ? ?Reason for Discharge:  ?Early Exit:  Physician advice  ? ?Smoking History:  ?Social History  ? ?Tobacco Use  ?Smoking Status Former  ? Packs/day: 1.00  ? Years: 22.00  ? Pack years: 22.00  ? Types: Cigarettes, Cigars  ? Quit date: 06/28/2020  ? Years since quitting: 1.0  ?Smokeless Tobacco Never  ?Tobacco Comments  ? cigarettes stopped in 2020, swisher sweets started in 2018-04-05/day  ? ? ?Diagnosis:  ?Chronic systolic CHF (congestive heart failure) ([place]) ? ?ADL [facility]: ? ? ?Initial Exercise Prescription: ? Initial Exercise Prescription - 04/14/21 1300   ? ?  ? Date of Initial Exercise RX and Referring Provider  ? Date 04/14/21   ? Referring Provider [name]   ? Expected Discharge Date 07/04/21   ?  ? Treadmill  ? MPH 1.2   ? Grade 0   ? Minutes 17   ?  ? Recumbant Elliptical  ? Level 1   ? Minutes 22   ?  ? Prescription Details  ? Frequency (times per week) 3   ? Duration Progress to 30 minutes of continuous aerobic without signs/symptoms of physical distress   ?  ? Intensity  ? THRR 40-80% of Max Heartrate 73-146   ? Ratings of Perceived Exertion 11-13   ? Perceived Dyspnea 0-4   ?  ? Resistance Training  ? Training Prescription Yes   ? Weight 3   ? Reps 10-15   ? ?  ?  ? ?  ? ? ?Discharge Exercise Prescription (Final Exercise Prescription Changes): ? Exercise Prescription Changes - 06/20/21 1016   ? ?  ? Response to Exercise  ? Blood Pressure (Admit) 82/42   ? Blood Pressure (Exercise) 92/48   ? Blood Pressure (Exit) 88/50   ? Heart Rate (Admit) 71 bpm   ? Heart Rate (Exercise) 101 bpm   ? Heart Rate (Exit) 83 bpm   ? Rating of Perceived Exertion (Exercise) 12   ? Duration Continue with 30 min of aerobic exercise  without signs/symptoms of physical distress.   ? Intensity THRR unchanged   ?  ? Progression  ? Progression Continue to progress workloads to maintain intensity without signs/symptoms of physical distress.   ?  ? Resistance Training  ? Training Prescription Yes   ? Weight 4   ? Reps 10-15   ? Time 10 Minutes   ?  ? Treadmill  ? MPH 2.6   ? Grade 1   ? Minutes 17   ? METs 3.35   ?  ? Recumbant Elliptical  ? Level 4   ? RPM 74   ? Minutes 22   ? METs 4.7   ? ?  ?  ? ?  ? ? ?Functional Capacity: ? 6 Minute Walk   ? ? [place] Name 04/14/21 1355  ?  ?  ?  ? 6 Minute Walk  ? Phase Initial    ? Distance 1100 feet    ? Walk Time 6 minutes    ? # of Rest Breaks 0    ? MPH 2.1    ? METS 4.44    ? RPE 10    ? VO2 Peak 15.54    ?  Symptoms No    ? Resting HR 70 bpm    ? Resting BP 110/64    ? Resting Oxygen Saturation  97 %    ? Exercise Oxygen Saturation  during 6 min walk 96 %    ? Max Ex. HR 86 bpm    ? Max Ex. BP 120/65    ? 2 Minute Post BP 113/63    ? ?  ?  ? ?  ? ? ?Psychological, QOL, Others - Outcomes: ?PHQ 2/9: ? ?  04/14/2021  ? 12:59 PM  ?Depression screen PHQ 2/9  ?Decreased Interest 1  ?Down, Depressed, Hopeless 1  ?PHQ - 2 Score 2  ?Altered sleeping 1  ?Tired, decreased energy 1  ?Change in appetite 0  ?Feeling bad or failure about yourself  0  ?Trouble concentrating 1  ?Moving slowly or fidgety/restless 1  ?Suicidal thoughts 0  ?PHQ-9 Score 6  ?Difficult doing work/chores Somewhat difficult  ? ? ?Quality of Life: ? Quality of Life - 04/14/21 1400   ? ?  ? Quality of Life  ? [place] Quality of Life   ?  ? Quality of Life Scores  ? Health/Function Pre 12.2 %   ? Socioeconomic Pre 14.79 %   ? Psych/Spiritual Pre 19.5 %   ? Family Pre 14.45 %   ? GLOBAL Pre 13.64 %   ? ?  ?  ? ?  ? ? ?Personal Goals: ?Goals established at orientation with interventions provided to work toward goal. ? Personal Goals and Risk Factors at Admission - 04/14/21 1314   ? ?  ? Core Components/Risk Factors/Patient Goals on Admission  ?  Weight  Management Yes;Weight Loss   ? Intervention Weight Management: Develop a combined nutrition and exercise program designed to reach desired caloric intake, while maintaining appropriate intake of nutrient and fiber, sodium and fats, and appropriate energy expenditure required for the weight goal.;Weight Management: Provide education and appropriate resources to help participant work on and attain dietary goals.;Weight Management/Obesity: Establish reasonable short term and long term weight goals.;Obesity: Provide education and appropriate resources to help participant work on and attain dietary goals.   ? Expected Outcomes Short Term: Continue to assess and modify interventions until short term weight is achieved;Long Term: Adherence to nutrition and physical activity/exercise program aimed toward attainment of established weight goal;Weight Maintenance: Understanding of the daily nutrition guidelines, which includes 25-35% calories from fat, 7% or less cal from saturated fats, less than '200mg'$  cholesterol, less than 1.5gm of sodium, & 5 or more servings of fruits and vegetables daily;Weight Loss: Understanding of general recommendations for a balanced deficit meal plan, which promotes 1-2 lb weight loss per week and includes a negative energy balance of [phone] kcal/d;Understanding recommendations for meals to include 15-35% energy as protein, 25-35% energy from fat, 35-60% energy from carbohydrates, less than '200mg'$  of dietary cholesterol, 20-35 gm of total fiber daily;Understanding of distribution of calorie intake throughout the day with the consumption of 4-5 meals/snacks   ? Personal Goal Other Yes   ? Personal Goal Improve strength and stamina, get an exercise routine going forward.   ? Intervention Patient will come to cardiac rehab and engage in a home exercise program.   ? Expected Outcomes He will improve his strength and stamina, and have enough knowledge to carry on a home exercise routine once he  finishes with the program.   ? ?  ?  ? ?  ?  ? ?Personal Goals Discharge: ? Goals and Risk  Factor Review   ? ? [place] Name 04/21/21 1302 05/19/21 0857 06/16/21 1020  ?  ?  ?  ? Core Components/Risk Factors/Patient Goals Review  ? Personal Goals Review Weight Management/Obesity;Heart Failure;Other Weight Management/Obesity;Heart Failure;Other Weight Management/Obesity;Heart Failure;Other    ? Review Patient was referred to [facility] with Chronic Systolic HF. He started the program today. He has multiple risk factors for CAD and is participating in the program for risk modification. His initial weight was 185.0. His personal goals for the program are to lose weight; gain strength and stamina; and continue to exercise routinely. We will continue to monitor his progress as he works towards meeting these goals. Patient has completed 13 sessions with his current weight of 188.3 up 3 lbs from last 30 day review. He is doing well in the program with consistent attendance and progressions. He saw his cardiologist 3/14 for follow up on his atrial arrhythmia's. No changes were made but will continue to monitor. He is in [facility] in [facility]. His blood pressures are usually soft with no symptoms. His personal goals for the program continue to be to lose weight; gain strength and stamina and continue to exercise routinely after her completes the program. We will continue to monitor as he works towards these goals. Patient has completed 22 sessions with his current weight of 185.3 down 3 lbs from last 30 day review. He continues to do well in the program with consistent attendance and progressions. He saw [name] 3/22 for routine check. He discontinued Losartan and added Entrestro with repeat labs in 10 days. His blood pressures continue to be soft with no symptoms. His personal goals for the program continue to be to lose weight; gain strength and stamina and continue to exercise routinely after her completes the program. We will continue to  monitor as he works towards these goals.    ? Expected Outcomes Patient will complete the program meeting both personal and program goals. Patient will complete the program meeting both personal and program goals. P

## 2021-07-07 ENCOUNTER — Encounter (HOSPITAL_COMMUNITY): Payer: Medicaid Other

## 2021-07-08 NOTE — Progress Notes (Signed)
Hi Ammie, please let Wyatt Donovan know that his CT showed substantial stool burden, which likely explains his abdominal pain. Please have him start drinking 8 cups of water per day, start a daily fiber supplement, walk 30 min per day, and start daily Miralax.

## 2021-07-09 ENCOUNTER — Encounter (HOSPITAL_COMMUNITY): Payer: Medicaid Other

## 2021-07-11 ENCOUNTER — Encounter (HOSPITAL_COMMUNITY): Payer: Medicaid Other

## 2021-07-28 ENCOUNTER — Encounter: Payer: Self-pay | Admitting: Certified Registered Nurse Anesthetist

## 2021-07-30 ENCOUNTER — Ambulatory Visit (AMBULATORY_SURGERY_CENTER): Payer: Medicaid Other | Admitting: Internal Medicine

## 2021-07-30 ENCOUNTER — Other Ambulatory Visit: Payer: Self-pay

## 2021-07-30 ENCOUNTER — Encounter: Payer: Self-pay | Admitting: Internal Medicine

## 2021-07-30 VITALS — BP 123/79 | HR 68 | Temp 97.8°F | Resp 17 | Ht 73.0 in | Wt 192.0 lb

## 2021-07-30 DIAGNOSIS — R1013 Epigastric pain: Secondary | ICD-10-CM

## 2021-07-30 DIAGNOSIS — R1030 Lower abdominal pain, unspecified: Secondary | ICD-10-CM

## 2021-07-30 DIAGNOSIS — K625 Hemorrhage of anus and rectum: Secondary | ICD-10-CM

## 2021-07-30 DIAGNOSIS — K259 Gastric ulcer, unspecified as acute or chronic, without hemorrhage or perforation: Secondary | ICD-10-CM

## 2021-07-30 DIAGNOSIS — K2970 Gastritis, unspecified, without bleeding: Secondary | ICD-10-CM

## 2021-07-30 DIAGNOSIS — K921 Melena: Secondary | ICD-10-CM | POA: Diagnosis not present

## 2021-07-30 DIAGNOSIS — Z1211 Encounter for screening for malignant neoplasm of colon: Secondary | ICD-10-CM

## 2021-07-30 DIAGNOSIS — K635 Polyp of colon: Secondary | ICD-10-CM | POA: Diagnosis not present

## 2021-07-30 DIAGNOSIS — K5140 Inflammatory polyps of colon without complications: Secondary | ICD-10-CM

## 2021-07-30 DIAGNOSIS — K529 Noninfective gastroenteritis and colitis, unspecified: Secondary | ICD-10-CM | POA: Diagnosis not present

## 2021-07-30 DIAGNOSIS — D125 Benign neoplasm of sigmoid colon: Secondary | ICD-10-CM

## 2021-07-30 MED ORDER — SODIUM CHLORIDE 0.9 % IV SOLN: 500.0000 mL | Freq: Once | INTRAVENOUS | Status: DC

## 2021-07-30 MED ORDER — OMEPRAZOLE 40 MG PO CPDR: 40.0000 mg | DELAYED_RELEASE_CAPSULE | Freq: Two times a day (BID) | ORAL | 0 refills | Status: DC

## 2021-07-30 NOTE — Progress Notes (Signed)
1127 Ephedrine 10 mg given IV due to low BP, MD updated.

## 2021-07-30 NOTE — Progress Notes (Signed)
Called to room to assist during endoscopic procedure.  Patient ID and intended procedure confirmed with present staff. Received instructions for my participation in the procedure from the performing physician.  

## 2021-07-30 NOTE — Patient Instructions (Addendum)
Discharge instructions given. Handouts on polyps,Hemorrhoids and Gastritis. Resume previous medications. Office will call to schedule return visit in 6 weeks.  YOU HAD AN ENDOSCOPIC PROCEDURE TODAY AT [facility]:   Refer to the procedure report that was given to you for any specific questions about what was found during the examination.  If the procedure report does not answer your questions, please call your gastroenterologist to clarify.  If you requested that your care partner not be given the details of your procedure findings, then the procedure report has been included in a sealed envelope for you to review at your convenience later.  YOU SHOULD EXPECT: Some feelings of bloating in the abdomen. Passage of more gas than usual.  Walking can help get rid of the air that was put into your GI tract during the procedure and reduce the bloating. If you had a lower endoscopy (such as a colonoscopy or flexible sigmoidoscopy) you may notice spotting of blood in your stool or on the toilet paper. If you underwent a bowel prep for your procedure, you may not have a normal bowel movement for a few days.  Please Note:  You might notice some irritation and congestion in your nose or some drainage.  This is from the oxygen used during your procedure.  There is no need for concern and it should clear up in a day or so.  SYMPTOMS TO REPORT IMMEDIATELY:  Following lower endoscopy (colonoscopy or flexible sigmoidoscopy):  Excessive amounts of blood in the stool  Significant tenderness or worsening of abdominal pains  Swelling of the abdomen that is new, acute  Fever of 100F or higher  Following upper endoscopy (EGD)  Vomiting of blood or coffee ground material  New chest pain or pain under the shoulder blades  Painful or persistently difficult swallowing  New shortness of breath  Fever of 100F or higher  Black, tarry-looking stools  For urgent or emergent issues, a gastroenterologist  can be reached at any hour by calling [phone]. Do not use MyChart messaging for urgent concerns.    DIET:  We do recommend a small meal at first, but then you may proceed to your regular diet.  Drink plenty of fluids but you should avoid alcoholic beverages for 24 hours.  ACTIVITY:  You should plan to take it easy for the rest of today and you should NOT DRIVE or use heavy machinery until tomorrow (because of the sedation medicines used during the test).    FOLLOW UP: Our staff will call the number listed on your records 48-72 hours following your procedure to check on you and address any questions or concerns that you may have regarding the information given to you following your procedure. If we do not reach you, we will leave a message.  We will attempt to reach you two times.  During this call, we will ask if you have developed any symptoms of COVID 19. If you develop any symptoms (ie: fever, flu-like symptoms, shortness of breath, cough etc.) before then, please call [phone].  If you test positive for Covid 19 in the 2 weeks post procedure, please call and report this information to [place].    If any biopsies were taken you will be contacted by phone or by letter within the next 1-3 weeks.  Please call us at [phone] if you have not heard about the biopsies in 3 weeks.    SIGNATURES/CONFIDENTIALITY: You and/or your care partner have signed paperwork which will  be entered into your electronic medical record.  These signatures attest to the fact that that the information above on your After Visit Summary has been reviewed and is understood.  Full responsibility of the confidentiality of this discharge information lies with you and/or your care-partner.

## 2021-07-30 NOTE — Op Note (Signed)
[facility] Patient Name: Wyatt Donovan Procedure Date: 07/30/2021 11:11 AM MRN: [id number] Endoscopist: Sonny Masters "Wyatt Donovan ,  Age: 36 Referring MD:  Date of Birth: 02/05/1986 Gender: Male Account #: [id number] Procedure:                Colonoscopy Indications:              Rectal bleeding Medicines:                Monitored Anesthesia Care Procedure:                Pre-Anesthesia Assessment:                           - Prior to the procedure, a History and Physical                            was performed, and patient medications and                            allergies were reviewed. The patient's tolerance of                            previous anesthesia was also reviewed. The risks                            and benefits of the procedure and the sedation                            options and risks were discussed with the patient.                            All questions were answered, and informed consent                            was obtained. Prior Anticoagulants: The patient has                            taken Eliquis (apixaban), last dose was 2 days                            prior to procedure, and Plavix, last dose was 7                            days prior to the procedure. ASA Grade Assessment:                            III - A patient with severe systemic disease. After                            reviewing the risks and benefits, the patient was                            deemed in satisfactory condition to undergo the  procedure.                           After obtaining informed consent, the colonoscope                            was passed under direct vision. Throughout the                            procedure, the patient's blood pressure, pulse, and                            oxygen saturations were monitored continuously. The                            CF [id number] #[id number] was introduced through the anus                             and advanced to the the terminal ileum. Scope In: 11:23:09 AM Scope Out: 11:47:08 AM Scope Withdrawal Time: 0 hours 21 minutes 26 seconds  Total Procedure Duration: 0 hours 23 minutes 59 seconds  Findings:                 The terminal ileum appeared normal.                           Two sessile polyps were found in the transverse                            colon and cecum. The polyps were 3 to 4 mm in size.                            These polyps were removed with a cold snare.                            Resection and retrieval were complete.                           Localized inflammation characterized by congestion                            (edema) and erythema was found in the sigmoid colon.                           Four sessile polyps were found in the sigmoid                            colon. The polyps were 3 to 10 mm in size. These                            polyps were removed with a cold snare. Resection                            and retrieval were complete.  Non-bleeding internal hemorrhoids were found during                            retroflexion and during perianal exam. Complications:            No immediate complications. Estimated Blood Loss:     Estimated blood loss: none. Estimated blood loss                            was minimal. Impression:               - The examined portion of the ileum was normal.                           - Two 3 to 4 mm polyps in the transverse colon and                            in the cecum, removed with a cold snare. Resected                            and retrieved.                           - Localized inflammation was found in the sigmoid                            colon.                           - Four 3 to 10 mm polyps in the sigmoid colon,                            removed with a cold snare. Resected and retrieved.                           - Non-bleeding internal hemorrhoids. Recommendation:            - Discharge patient to home (with escort).                           - Await pathology results.                           - The findings and recommendations were discussed                            with the patient.                           - Return to GI clinic in 6 weeks. Sonny Masters "Wyatt Donovan,  07/30/2021 11:57:03 AM

## 2021-07-30 NOTE — Progress Notes (Signed)
GASTROENTEROLOGY PROCEDURE H&P NOTE   Primary Care Physician: Leonie Douglas, MD    Reason for Procedure:   Rectal bleeding, Melena, Family history of colon cancer  Plan:    EGD/colonoscopy  Patient is appropriate for endoscopic procedure(s) in the ambulatory ([facility]) setting.  The nature of the procedure, as well as the risks, benefits, and alternatives were carefully and thoroughly reviewed with the patient. Ample time for discussion and questions allowed. The patient understood, was satisfied, and agreed to proceed.     HPI: Wyatt Donovan is a 36 y.o. male who presents for EGD/colonoscopy for evaluation of rectal bleeding, melena, family history of colon cancer .  Patient was most recently seen in the Gastroenterology Clinic on 06/19/21.  No interval change in medical history since that appointment. Please refer to that note for full details regarding GI history and clinical presentation.   Past Medical History:  Diagnosis Date   Atypical chest pain    CAD (coronary artery disease)    Dyslipidemia    Ex-smoker    History of depression    Insomnia    Ischemic cardiomyopathy    Ejection fraction 40-45%   NSTEMI (non-ST elevated myocardial infarction) ([facility]) 08/2007   Treated with a bare metal stent to the proximal LAD   Suicide attempt ([place]) 01/2001    Past Surgical History:  Procedure Laterality Date   ADENOIDECTOMY     CARDIOVERSION N/A 12/06/2020   Procedure: CARDIOVERSION;  Surgeon: Larey Dresser, MD;  Location: [facility];  Service: Cardiovascular;  Laterality: N/A;   CORONARY ARTERY BYPASS GRAFT N/A 07/03/2020   Procedure: CORONARY ARTERY BYPASS GRAFTING (CABG) times four using left internal mammary artery, right arm radial artery and right leg saphenous vein;  Surgeon: Lajuana Matte, MD;  Location: [facility];  Service: Open Heart Surgery;  Laterality: N/A;   CORONARY STENT INTERVENTION N/A 12/03/2020   Procedure: CORONARY STENT INTERVENTION;  Surgeon:  Martinique, Peter M, MD;  Location: [facility];  Service: Cardiovascular;  Laterality: N/A;   CORONARY STENT PLACEMENT     Bare metal stent to proximal LAD   ICD IMPLANT N/A 02/10/2021   Procedure: ICD IMPLANT;  Surgeon: [name], MD;  Location: [facility];  Service: Cardiovascular;  Laterality: N/A;   LEFT HEART CATH AND CORONARY ANGIOGRAPHY N/A 07/01/2020   Procedure: LEFT HEART CATH AND CORONARY ANGIOGRAPHY;  Surgeon: Lorretta Harp, MD;  Location: [facility];  Service: Cardiovascular;  Laterality: N/A;   LEFT HEART CATH AND CORS/GRAFTS ANGIOGRAPHY N/A 12/03/2020   Procedure: LEFT HEART CATH AND CORS/GRAFTS ANGIOGRAPHY;  Surgeon: Martinique, Peter M, MD;  Location: [facility];  Service: Cardiovascular;  Laterality: N/A;   RADIAL ARTERY HARVEST Right 07/03/2020   Procedure: RADIAL ARTERY HARVEST;  Surgeon: Lajuana Matte, MD;  Location: [facility];  Service: Open Heart Surgery;  Laterality: Right;   TEE WITHOUT CARDIOVERSION N/A 07/03/2020   Procedure: TRANSESOPHAGEAL ECHOCARDIOGRAM (TEE);  Surgeon: Lajuana Matte, MD;  Location: [facility];  Service: Open Heart Surgery;  Laterality: N/A;    Prior to Admission medications   Medication Sig Start Date End Date Taking? Authorizing Provider  amiodarone (PACERONE) 200 MG tablet Take 1 tablet (200 mg total) by mouth daily. 01/13/21   Larey Dresser, MD  apixaban (ELIQUIS) 5 MG TABS tablet Take 1 tablet (5 mg total) by mouth 2 (two) times daily. 02/16/21   Shirley Friar, PA-C  atorvastatin (LIPITOR) 80 MG tablet  Take 1 tablet (80 mg total) by mouth daily. 04/04/21   Bensimhon, Shaune Pascal, MD  carvedilol (COREG) 3.125 MG tablet Take 1 tablet (3.125 mg total) by mouth 2 (two) times daily with a meal. 04/09/21   Bensimhon, Shaune Pascal, MD  clopidogrel (PLAVIX) 75 MG tablet Take 1 tablet (75 mg total) by mouth daily. Hold for 7 days starting 07/03/21; may re-start after that time if bleeding has stopped 07/03/21   Larey Dresser, MD  dapagliflozin propanediol (FARXIGA) 10 MG TABS tablet Take 1 tablet (10 mg total) by mouth daily before breakfast. 05/01/21   Larey Dresser, MD  hydrOXYzine (ATARAX) 10 MG tablet Take 10 mg by mouth at bedtime. 04/29/21   [provider]  nitroGLYCERIN (NITROSTAT) 0.4 MG SL tablet Place 0.4 mg under the tongue every 5 (five) minutes x 3 doses as needed for chest pain.    [provider]  sacubitril-valsartan (ENTRESTO) 24-26 MG Take 1 tablet by mouth 2 (two) times daily. 05/21/21   Larey Dresser, MD  sodium zirconium cyclosilicate (LOKELMA) 5 g packet Take 5 g by mouth daily. 03/13/21   Larey Dresser, MD  spironolactone (ALDACTONE) 25 MG tablet Take 1 tablet (25 mg total) by mouth daily. 04/09/21   Bensimhon, Shaune Pascal, MD    Current Outpatient Medications  Medication Sig Dispense Refill   amiodarone (PACERONE) 200 MG tablet Take 1 tablet (200 mg total) by mouth daily. 30 tablet 6   apixaban (ELIQUIS) 5 MG TABS tablet Take 1 tablet (5 mg total) by mouth 2 (two) times daily. 60 tablet 6   atorvastatin (LIPITOR) 80 MG tablet Take 1 tablet (80 mg total) by mouth daily. 30 tablet 11   carvedilol (COREG) 3.125 MG tablet Take 1 tablet (3.125 mg total) by mouth 2 (two) times daily with a meal. 180 tablet 3   clopidogrel (PLAVIX) 75 MG tablet Take 1 tablet (75 mg total) by mouth daily. Hold for 7 days starting 07/03/21; may re-start after that time if bleeding has stopped 30 tablet 6   dapagliflozin propanediol (FARXIGA) 10 MG TABS tablet Take 1 tablet (10 mg total) by mouth daily before breakfast. 30 tablet 11   hydrOXYzine (ATARAX) 10 MG tablet Take 10 mg by mouth at bedtime.     nitroGLYCERIN (NITROSTAT) 0.4 MG SL tablet Place 0.4 mg under the tongue every 5 (five) minutes x 3 doses as needed for chest pain.     sacubitril-valsartan (ENTRESTO) 24-26 MG Take 1 tablet by mouth 2 (two) times daily. 180 tablet 3   sodium zirconium cyclosilicate (LOKELMA) 5 g packet Take 5 g  by mouth daily. 30 packet 6   spironolactone (ALDACTONE) 25 MG tablet Take 1 tablet (25 mg total) by mouth daily. 90 tablet 3   Current Facility-Administered Medications  Medication Dose Route Frequency Provider Last Rate Last Admin   0.9 %  sodium chloride infusion  500 mL Intravenous Once Sharyn Creamer, MD        Allergies as of 07/30/2021 - Review Complete 07/30/2021  Allergen Reaction Noted   Codeine Itching 12/27/2007    Family History  Problem Relation Age of Onset   Colon cancer Father    Colon cancer Paternal Grandfather    Colon cancer Paternal Aunt    Hypertension Neg Hx    Diabetes Neg Hx    Coronary artery disease Neg Hx     Social History   Socioeconomic History   Marital status: Single  Spouse name: Not on file   Number of children: Not on file   Years of education: Not on file   Highest education level: High school graduate  Occupational History   Occupation: Not actively working    Comment: applied for disability x 2.   Tobacco Use   Smoking status: Former    Packs/day: 1.00    Years: 22.00    Pack years: 22.00    Types: Cigarettes, Cigars    Quit date: 06/28/2020    Years since quitting: 1.0   Smokeless tobacco: Never   Tobacco comments:    cigarettes stopped in 2020, swisher sweets started in 2018-04-05/day  Vaping Use   Vaping Use: Never used  Substance and Sexual Activity   Alcohol use: Never   Drug use: Not Currently    Types: Marijuana   Sexual activity: Never  Other Topics Concern   Not on file  Social History Narrative   Single   Lives with his parents   Trying to get his GED   DeGent date all cultures   Social Determinants of Health   Financial Resource Strain: Medium Risk   Difficulty of Paying Living Expenses: Somewhat hard  Food Insecurity: No Food Insecurity   Worried About Charity fundraiser in the Last Year: Never true   Ran Out of Food in the Last Year: Never true  Transportation Needs: No Transportation Needs   Lack  of Transportation (Medical): No   Lack of Transportation (Non-Medical): No  Physical Activity: Not on file  Stress: Not on file  Social Connections: Not on file  Intimate Partner Violence: Not on file    Physical Exam: Vital signs in last 24 hours: BP 119/77   Pulse 73   Temp 97.8 F (36.6 C) (Temporal)   Ht '6\' 1"'$  (1.854 m)   Wt 192 lb (87.1 kg)   SpO2 100%   BMI 25.33 kg/m  GEN: NAD EYE: Sclerae anicteric ENT: MMM CV: Non-tachycardic Pulm: No increased WOB GI: Soft NEURO:  Alert & Oriented   [name], MD [facility]   07/30/2021 10:26 AM

## 2021-07-30 NOTE — Progress Notes (Signed)
Report given to PACU, vss 

## 2021-07-30 NOTE — Progress Notes (Signed)
1110 Robinul 0.1 mg IV given due large amount of secretions upon assessment.  MD made aware, vss 

## 2021-07-30 NOTE — Progress Notes (Signed)
1133 Ephedrine 10 mg given IV due to low BP, MD updated.

## 2021-07-30 NOTE — Op Note (Signed)
[facility] Patient Name: Wyatt Donovan Procedure Date: 07/30/2021 11:12 AM MRN: [id number] Endoscopist: Sonny Masters "Wyatt Donovan ,  Age: 36 Referring MD:  Date of Birth: 1985/08/28 Gender: Male Account #: [id number] Procedure:                Upper GI endoscopy Indications:              Melena Medicines:                Monitored Anesthesia Care Procedure:                Pre-Anesthesia Assessment:                           - Prior to the procedure, a History and Physical                            was performed, and patient medications and                            allergies were reviewed. The patient's tolerance of                            previous anesthesia was also reviewed. The risks                            and benefits of the procedure and the sedation                            options and risks were discussed with the patient.                            All questions were answered, and informed consent                            was obtained. Prior Anticoagulants: The patient has                            taken Eliquis (apixaban), last dose was 2 days                            prior to procedure, and Plavix, last dose was 7                            days prior to the procedure. ASA Grade Assessment:                            III - A patient with severe systemic disease. After                            reviewing the risks and benefits, the patient was                            deemed in satisfactory condition to undergo the  procedure.                           After obtaining informed consent, the endoscope was                            passed under direct vision. Throughout the                            procedure, the patient's blood pressure, pulse, and                            oxygen saturations were monitored continuously. The                            Endoscope was introduced through the mouth, and                             advanced to the second part of duodenum. The upper                            GI endoscopy was accomplished without difficulty.                            The patient tolerated the procedure well. Scope In: Scope Out: Findings:                 The examined esophagus was normal.                           Localized moderate inflammation characterized by                            congestion (edema), erosions and erythema was found                            in the gastric antrum. Biopsies were taken with a                            cold forceps for histology.                           The examined duodenum was normal. Complications:            No immediate complications. Estimated Blood Loss:     Estimated blood loss was minimal. Impression:               - Normal esophagus.                           - Gastritis. Biopsied.                           - Normal examined duodenum. Recommendation:           - Await pathology results.                           -  Use Prilosec (omeprazole) 40 mg PO BID for 8                            weeks.                           - Perform a colonoscopy today. Sonny Masters "Wyatt Donovan,  07/30/2021 11:52:09 AM

## 2021-07-31 ENCOUNTER — Telehealth: Payer: Self-pay

## 2021-07-31 ENCOUNTER — Telehealth: Payer: Self-pay | Admitting: *Deleted

## 2021-07-31 ENCOUNTER — Other Ambulatory Visit: Payer: Self-pay

## 2021-07-31 NOTE — Telephone Encounter (Signed)
First post procedure follow up call, no answer 

## 2021-07-31 NOTE — Telephone Encounter (Signed)
  Follow up Call-     07/30/2021   10:24 AM  Call back number  Post procedure Call Back phone  # [phone]  Permission to leave phone message Yes     Patient questions:  Do you have a fever, pain , or abdominal swelling? No. Pain Score  0 *  Have you tolerated food without any problems? Yes.    Have you been able to return to your normal activities? Yes.    Do you have any questions about your discharge instructions: Diet   No. Medications  No. Follow up visit  No.  Do you have questions or concerns about your Care? No.  Actions: * If pain score is 4 or above: No action needed, pain <4.

## 2021-08-01 ENCOUNTER — Encounter: Payer: Self-pay | Admitting: Internal Medicine

## 2021-08-11 ENCOUNTER — Other Ambulatory Visit (HOSPITAL_COMMUNITY): Payer: Self-pay | Admitting: Cardiology

## 2021-08-12 ENCOUNTER — Ambulatory Visit (INDEPENDENT_AMBULATORY_CARE_PROVIDER_SITE_OTHER): Payer: Medicaid Other

## 2021-08-12 DIAGNOSIS — I255 Ischemic cardiomyopathy: Secondary | ICD-10-CM

## 2021-08-12 LAB — CUP PACEART REMOTE DEVICE CHECK
Battery Remaining Longevity: 156 mo
Battery Remaining Percentage: 100 %
Brady Statistic RA Percent Paced: 0 %
Brady Statistic RV Percent Paced: 0 %
Date Time Interrogation Session: 20230613052800
HighPow Impedance: 70 Ohm
Implantable Lead Implant Date: 20221213
Implantable Lead Implant Date: 20221213
Implantable Lead Location: 753859
Implantable Lead Location: 753860
Implantable Lead Model: 673
Implantable Lead Model: 7841
Implantable Lead Serial Number: 1211072
Implantable Lead Serial Number: 160080
Implantable Pulse Generator Implant Date: 20221213
Lead Channel Impedance Value: 439 Ohm
Lead Channel Impedance Value: 497 Ohm
Lead Channel Setting Pacing Amplitude: 2 V
Lead Channel Setting Pacing Amplitude: 2.5 V
Lead Channel Setting Pacing Pulse Width: 0.4 ms
Lead Channel Setting Sensing Sensitivity: 0.5 mV
Pulse Gen Serial Number: 617464

## 2021-08-26 ENCOUNTER — Ambulatory Visit (HOSPITAL_COMMUNITY)
Admission: RE | Admit: 2021-08-26 | Discharge: 2021-08-26 | Disposition: A | Discharge status: Home / Self Care | Payer: Medicaid Other | Source: Ambulatory Visit | Attending: Cardiology | Admitting: Cardiology

## 2021-08-26 ENCOUNTER — Encounter (HOSPITAL_COMMUNITY): Payer: Self-pay | Admitting: Cardiology

## 2021-08-26 VITALS — BP 102/70 | HR 66 | Wt 190.6 lb

## 2021-08-26 DIAGNOSIS — I5022 Chronic systolic (congestive) heart failure: Secondary | ICD-10-CM | POA: Insufficient documentation

## 2021-08-26 DIAGNOSIS — I252 Old myocardial infarction: Secondary | ICD-10-CM | POA: Diagnosis not present

## 2021-08-26 DIAGNOSIS — Z7902 Long term (current) use of antithrombotics/antiplatelets: Secondary | ICD-10-CM | POA: Insufficient documentation

## 2021-08-26 DIAGNOSIS — Z955 Presence of coronary angioplasty implant and graft: Secondary | ICD-10-CM | POA: Diagnosis not present

## 2021-08-26 DIAGNOSIS — E7800 Pure hypercholesterolemia, unspecified: Secondary | ICD-10-CM | POA: Diagnosis not present

## 2021-08-26 DIAGNOSIS — E875 Hyperkalemia: Secondary | ICD-10-CM | POA: Insufficient documentation

## 2021-08-26 DIAGNOSIS — I4892 Unspecified atrial flutter: Secondary | ICD-10-CM | POA: Diagnosis not present

## 2021-08-26 DIAGNOSIS — K625 Hemorrhage of anus and rectum: Secondary | ICD-10-CM | POA: Diagnosis not present

## 2021-08-26 DIAGNOSIS — Z79899 Other long term (current) drug therapy: Secondary | ICD-10-CM | POA: Insufficient documentation

## 2021-08-26 DIAGNOSIS — K5900 Constipation, unspecified: Secondary | ICD-10-CM | POA: Diagnosis not present

## 2021-08-26 DIAGNOSIS — Z951 Presence of aortocoronary bypass graft: Secondary | ICD-10-CM | POA: Diagnosis not present

## 2021-08-26 DIAGNOSIS — I959 Hypotension, unspecified: Secondary | ICD-10-CM | POA: Diagnosis not present

## 2021-08-26 DIAGNOSIS — K219 Gastro-esophageal reflux disease without esophagitis: Secondary | ICD-10-CM | POA: Insufficient documentation

## 2021-08-26 DIAGNOSIS — Z7901 Long term (current) use of anticoagulants: Secondary | ICD-10-CM | POA: Diagnosis not present

## 2021-08-26 DIAGNOSIS — I25119 Atherosclerotic heart disease of native coronary artery with unspecified angina pectoris: Secondary | ICD-10-CM | POA: Diagnosis not present

## 2021-08-26 DIAGNOSIS — I255 Ischemic cardiomyopathy: Secondary | ICD-10-CM | POA: Diagnosis not present

## 2021-08-26 LAB — IRON AND TIBC
Iron: 79 ug/dL (ref 45–182)
Saturation Ratios: 19 % (ref 17.9–39.5)
TIBC: 427 ug/dL (ref 250–450)
UIBC: 348 ug/dL

## 2021-08-26 LAB — COMPREHENSIVE METABOLIC PANEL
ALT: 32 U/L (ref 0–44)
AST: 28 U/L (ref 15–41)
Albumin: 3.6 g/dL (ref 3.5–5.0)
Alkaline Phosphatase: 68 U/L (ref 38–126)
Anion gap: 10 (ref 5–15)
BUN: 12 mg/dL (ref 6–20)
CO2: 20 mmol/L — ABNORMAL LOW (ref 22–32)
Calcium: 9.3 mg/dL (ref 8.9–10.3)
Chloride: 112 mmol/L — ABNORMAL HIGH (ref 98–111)
Creatinine, Ser: 1.03 mg/dL (ref 0.61–1.24)
GFR, Estimated: >60 mL/min (ref >60)
Glucose, Bld: 93 mg/dL (ref 70–99)
Potassium: 4.5 mmol/L (ref 3.5–5.1)
Sodium: 142 mmol/L (ref 135–145)
Total Bilirubin: 0.9 mg/dL (ref 0.3–1.2)
Total Protein: 6.6 g/dL (ref 6.5–8.1)

## 2021-08-26 LAB — LIPID PANEL
Cholesterol: 106 mg/dL (ref 0–200)
HDL: 31 mg/dL — ABNORMAL LOW (ref >40)
LDL Cholesterol: 60 mg/dL (ref 0–99)
Total CHOL/HDL Ratio: 3.4 RATIO
Triglycerides: 76 mg/dL (ref <150)
VLDL: 15 mg/dL (ref 0–40)

## 2021-08-26 LAB — CBC
HCT: 50.8 % (ref 39.0–52.0)
Hemoglobin: 15.9 g/dL (ref 13.0–17.0)
MCH: 30.8 pg (ref 26.0–34.0)
MCHC: 31.3 g/dL (ref 30.0–36.0)
MCV: 98.4 fL (ref 80.0–100.0)
Platelets: 222 10*3/uL (ref 150–400)
RBC: 5.16 MIL/uL (ref 4.22–5.81)
RDW: 13.2 % (ref 11.5–15.5)
WBC: 7.3 10*3/uL (ref 4.0–10.5)
nRBC: 0 % (ref 0.0–0.2)

## 2021-08-26 LAB — FERRITIN: Ferritin: 14 ng/mL — ABNORMAL LOW (ref 24–336)

## 2021-08-26 LAB — TSH: TSH: 0.43 u[IU]/mL (ref 0.350–4.500)

## 2021-08-26 MED ORDER — CARVEDILOL 6.25 MG PO TABS: 6.2500 mg | ORAL_TABLET | Freq: Two times a day (BID) | ORAL | 3 refills | Status: DC

## 2021-08-26 MED ORDER — ATORVASTATIN CALCIUM 80 MG PO TABS: 80.0000 mg | ORAL_TABLET | Freq: Every day | ORAL | 3 refills | Status: DC

## 2021-08-26 MED ORDER — APIXABAN 5 MG PO TABS: 5.0000 mg | ORAL_TABLET | Freq: Two times a day (BID) | ORAL | 3 refills | Status: DC

## 2021-08-26 MED ORDER — DAPAGLIFLOZIN PROPANEDIOL 10 MG PO TABS: 10.0000 mg | ORAL_TABLET | Freq: Every day | ORAL | 11 refills | Status: DC

## 2021-08-26 MED ORDER — PANTOPRAZOLE SODIUM 40 MG PO TBEC: 40.0000 mg | DELAYED_RELEASE_TABLET | Freq: Every day | ORAL | 3 refills | Status: DC

## 2021-08-26 MED ORDER — SPIRONOLACTONE 25 MG PO TABS: 25.0000 mg | ORAL_TABLET | Freq: Every day | ORAL | 3 refills | Status: DC

## 2021-08-26 MED ORDER — CLOPIDOGREL BISULFATE 75 MG PO TABS: 75.0000 mg | ORAL_TABLET | Freq: Every day | ORAL | 6 refills | Status: DC

## 2021-08-26 MED ORDER — ENTRESTO 24-26 MG PO TABS: 1.0000 | ORAL_TABLET | Freq: Two times a day (BID) | ORAL | 3 refills | Status: DC

## 2021-08-26 MED ORDER — AMIODARONE HCL 200 MG PO TABS: 200.0000 mg | ORAL_TABLET | Freq: Every day | ORAL | 3 refills | Status: DC

## 2021-09-08 ENCOUNTER — Encounter: Payer: Self-pay | Admitting: Internal Medicine

## 2021-09-08 ENCOUNTER — Ambulatory Visit (INDEPENDENT_AMBULATORY_CARE_PROVIDER_SITE_OTHER): Payer: Medicaid Other | Admitting: Internal Medicine

## 2021-09-08 VITALS — BP 102/73 | HR 53 | Ht 73.0 in | Wt 196.0 lb

## 2021-09-08 DIAGNOSIS — K625 Hemorrhage of anus and rectum: Secondary | ICD-10-CM

## 2021-09-08 DIAGNOSIS — R1013 Epigastric pain: Secondary | ICD-10-CM | POA: Diagnosis not present

## 2021-09-08 DIAGNOSIS — K5900 Constipation, unspecified: Secondary | ICD-10-CM

## 2021-09-08 DIAGNOSIS — K649 Unspecified hemorrhoids: Secondary | ICD-10-CM | POA: Diagnosis not present

## 2021-09-08 MED ORDER — HYDROCORTISONE (PERIANAL) 2.5 % EX CREA: 1.0000 | TOPICAL_CREAM | Freq: Two times a day (BID) | CUTANEOUS | 1 refills | Status: DC

## 2021-09-08 MED ORDER — LINACLOTIDE 145 MCG PO CAPS: 145.0000 ug | ORAL_CAPSULE | Freq: Every day | ORAL | 3 refills | Status: DC

## 2021-09-08 NOTE — Patient Instructions (Signed)
If you are age 38 or older, your body mass index should be between 23-30. Your Body mass index is 25.86 kg/m. If this is out of the aforementioned range listed, please consider follow up with your Primary Care Provider.  If you are age 1 or younger, your body mass index should be between 19-25. Your Body mass index is 25.86 kg/m. If this is out of the aformentioned range listed, please consider follow up with your Primary Care Provider.   ________________________________________________________  The [facility] providers would like to encourage you to use [facility] to communicate with providers for non-urgent requests or questions.  Due to long hold times on the telephone, sending your provider a message by [facility] may be a faster and more efficient way to get a response.  Please allow 48 business hours for a response.  Please remember that this is for non-urgent requests.  _______________________________________________________  We have sent the following medications to your pharmacy for you to pick up at your convenience:  Linzess, Anusol HC cream- use twice a day for 7 days.  Drink 8 cups of water a day and walk 30 minutes a day.  Please purchase the following medications over the counter and take as directed: Fiber supplement such as Benefiber- use as directed daily Miralax: Take as  directed up to 3 times a day to achieve regular bowel movements  Please follow up on ___________________

## 2021-09-08 NOTE — Progress Notes (Signed)
Chief Complaint: Blood in stools  HPI : 36 year old male with history of CAD s/p CABG and PCI, HFrEF (35-40%), depression presents for follow up of blood in stools.  He has been seeing blood in his stools for the last 2 weeks. His stools have looked black at times, and when he wipes, he will see red on the toilet paper. Sometimes when he has a stool, he will see some scant amounts of red in the toilet bowl water. He had a bout of severe constipation before his bleeding stopped. It took him 3-4 days before he was able to have a BM. His constipation gotten a little bit better since then. He is on both Eliquis and Plavix. Dad, paternal grandfather, and paternal aunt had colon cancer. Dad was diagnosed with colon cancer at age 83. He follows with [name] for his cardiology care. Denies prior colonoscopy or EGD. When he finishes eating dinner, he will have some mild abdominal pain. Denies N&V, dysphagia, weight loss. He does have a little bit of heartburn for which he takes Tums. Denies alcohol use. Denies fam hx of liver disease. He used to be heavy smoker. Denies NSAID use.  Interval History: He is having one BM once every 4-5 days. He is still taking Miralax PRN. Endorses pain in the epigastric abdomen. Using pantoprazole 40 mg QD but does not feel like this is helping significantly with his pain. Has been having some rectal bleeding. He is still on Eliquis and Plavix  Wt Readings from Last 3 Encounters:  09/08/21 196 lb (88.9 kg)  08/26/21 190 lb 9.6 oz (86.5 kg)  07/30/21 192 lb (87.1 kg)   Past Medical History:  Diagnosis Date   Atrial fibrillation (HCC)    Atypical chest pain    CAD (coronary artery disease)    CHF (congestive heart failure) (HCC)    Dyslipidemia    Ex-smoker    History of depression    Hyperlipidemia    Insomnia    Ischemic cardiomyopathy    Ejection fraction 40-45%   NSTEMI (non-ST elevated myocardial infarction) ([facility]) 08/01/2007   Treated with a bare metal  stent to the proximal LAD, pt states MI x3   Suicide attempt ([place]) 01/30/2001     Past Surgical History:  Procedure Laterality Date   ADENOIDECTOMY     CARDIOVERSION N/A 12/06/2020   Procedure: CARDIOVERSION;  Surgeon: Larey Dresser, MD;  Location: [facility];  Service: Cardiovascular;  Laterality: N/A;   COLONOSCOPY     CORONARY ARTERY BYPASS GRAFT N/A 07/03/2020   Procedure: CORONARY ARTERY BYPASS GRAFTING (CABG) times four using left internal mammary artery, right arm radial artery and right leg saphenous vein;  Surgeon: Lajuana Matte, MD;  Location: [facility];  Service: Open Heart Surgery;  Laterality: N/A;   CORONARY STENT INTERVENTION N/A 12/03/2020   Procedure: CORONARY STENT INTERVENTION;  Surgeon: Martinique, Peter M, MD;  Location: [facility];  Service: Cardiovascular;  Laterality: N/A;   CORONARY STENT PLACEMENT     Bare metal stent to proximal LAD   ICD IMPLANT N/A 02/10/2021   Procedure: ICD IMPLANT;  Surgeon: [name], MD;  Location: [facility];  Service: Cardiovascular;  Laterality: N/A;   LEFT HEART CATH AND CORONARY ANGIOGRAPHY N/A 07/01/2020   Procedure: LEFT HEART CATH AND CORONARY ANGIOGRAPHY;  Surgeon: Lorretta Harp, MD;  Location: [facility];  Service: Cardiovascular;  Laterality: N/A;   LEFT HEART CATH AND CORS/GRAFTS ANGIOGRAPHY  N/A 12/03/2020   Procedure: LEFT HEART CATH AND CORS/GRAFTS ANGIOGRAPHY;  Surgeon: Martinique, Peter M, MD;  Location: [facility];  Service: Cardiovascular;  Laterality: N/A;   RADIAL ARTERY HARVEST Right 07/03/2020   Procedure: RADIAL ARTERY HARVEST;  Surgeon: Lajuana Matte, MD;  Location: [facility];  Service: Open Heart Surgery;  Laterality: Right;   TEE WITHOUT CARDIOVERSION N/A 07/03/2020   Procedure: TRANSESOPHAGEAL ECHOCARDIOGRAM (TEE);  Surgeon: Lajuana Matte, MD;  Location: [facility];  Service: Open Heart Surgery;  Laterality: N/A;   TYMPANOSTOMY TUBE PLACEMENT     when I was a kid    UPPER GASTROINTESTINAL ENDOSCOPY     Family History  Problem Relation Age of Onset   Colon cancer Father    Colon cancer Paternal Aunt    Colon cancer Paternal Grandfather    Hypertension Neg Hx    Diabetes Neg Hx    Coronary artery disease Neg Hx    Esophageal cancer Neg Hx    Rectal cancer Neg Hx    Stomach cancer Neg Hx    Social History   Tobacco Use   Smoking status: Former    Packs/day: 1.00    Years: 22.00    Total pack years: 22.00    Types: Cigarettes, Cigars    Quit date: 06/28/2020    Years since quitting: 1.1   Smokeless tobacco: Never   Tobacco comments:    cigarettes stopped in 2020, swisher sweets started in 2018-04-05/day  Vaping Use   Vaping Use: Never used  Substance Use Topics   Alcohol use: Never   Drug use: Not Currently    Types: Marijuana   Current Outpatient Medications  Medication Sig Dispense Refill   amiodarone (PACERONE) 200 MG tablet Take 1 tablet (200 mg total) by mouth daily. 90 tablet 3   apixaban (ELIQUIS) 5 MG TABS tablet Take 1 tablet (5 mg total) by mouth 2 (two) times daily. 180 tablet 3   atorvastatin (LIPITOR) 80 MG tablet Take 1 tablet (80 mg total) by mouth daily. 90 tablet 3   carvedilol (COREG) 6.25 MG tablet Take 1 tablet (6.25 mg total) by mouth 2 (two) times daily with a meal. 180 tablet 3   clopidogrel (PLAVIX) 75 MG tablet Take 1 tablet (75 mg total) by mouth daily. Hold for 7 days starting 07/03/21; may re-start after that time if bleeding has stopped 30 tablet 6   dapagliflozin propanediol (FARXIGA) 10 MG TABS tablet Take 1 tablet (10 mg total) by mouth daily before breakfast. 30 tablet 11   hydrOXYzine (ATARAX) 10 MG tablet Take 10 mg by mouth at bedtime.     nitroGLYCERIN (NITROSTAT) 0.4 MG SL tablet Place 0.4 mg under the tongue every 5 (five) minutes x 3 doses as needed for chest pain.     pantoprazole (PROTONIX) 40 MG tablet Take 1 tablet (40 mg total) by mouth daily. 90 tablet 3   sacubitril-valsartan (ENTRESTO) 24-26  MG Take 1 tablet by mouth 2 (two) times daily. 180 tablet 3   sodium zirconium cyclosilicate (LOKELMA) 5 g packet Take 5 g by mouth daily. 30 packet 6   spironolactone (ALDACTONE) 25 MG tablet Take 1 tablet (25 mg total) by mouth daily. 90 tablet 3   No current facility-administered medications for this visit.   Allergies  Allergen Reactions   Codeine Itching     Review of Systems: All systems reviewed and negative except where noted in HPI.   Physical Exam: BP 102/73   Pulse Marland Kitchen)  53   Ht '6\' 1"'$  (1.854 m)   Wt 196 lb (88.9 kg)   BMI 25.86 kg/m  Constitutional: Pleasant,well-developed, male in no acute distress. HEENT: Normocephalic and atraumatic. Conjunctivae are normal. No scleral icterus. Cardiovascular: Normal rate, regular rhythm.  Pulmonary/chest: Effort normal and breath sounds normal. No wheezing, rales or rhonchi. Abdominal: Soft, nondistended, tender in the epigastric area Extremities: No edema Neurological: Alert and oriented to person place and time. Skin: Skin is warm and dry. No rashes noted. Psychiatric: Normal mood and affect. Behavior is normal.  Labs 04/2021: CBC nml. TSH nml. CMP with mildly elevated ALT of 69  Labs 05/2021: BMP unremarkable  Labs 05/2021: Lipase, iron/TIBC, IgG, A1AT, AMA nml.   CT A/P w/contrast 07/01/21: IMPRESSION: Large stool burden and gaseous distention throughout the colon. No acute findings in the abdomen or pelvis.  EGD 07/30/21: - Normal esophagus. - Gastritis. Biopsied. - Normal examined duodenum. Path: 1. Surgical [P], gastric random FRAGMENTS OF GASTRIC MUCOSA WITH SUPERFICIAL MUCOSAL EROSIONS AND MINIMAL VASCULAR ECTASIA. H. PYLORI, INTESTINAL METAPLASIA, ATROPHY AND DYSPLASIA ARE NOT IDENTIFIED.  Colonoscopy 07/30/21: - The examined portion of the ileum was normal. - Two 3 to 4 mm polyps in the transverse colon and in the cecum, removed with a cold snare. Resected and retrieved. - Localized inflammation was found in the  sigmoid colon. - Four 3 to 10 mm polyps in the sigmoid colon, removed with a cold snare. Resected and retrieved. - Non-bleeding internal hemorrhoids. Path: 2. Surgical [P], right colon biopsies MILD NONSPECIFIC INFLAMMATION CONSISTENT WITH PREP RELATED CHANGES. ONE FRAGMENT HAS A PORTION OF A SUBMUCOSAL LIPOMA. THERE ARE NO DIAGNOSTIC FEATURES OF INFLAMMATORY BOWEL DISEASE, MICROSCOPIC COLITIS AND COLLAGENOUS COLITIS. 3. Surgical [P], colon, cecum, transverse, polyp (2) INFLAMMATORY PSEUDOPOLYP AND A HYPERPLASTIC POLYP. NEGATIVE FOR DYSPLASIA. 4. Surgical [P], colon, transverse MILD NONSPECIFIC INFLAMMATION CONSISTENT WITH PREP RELATED CHANGES. THERE ARE NO DIAGNOSTIC FEATURES OF INFLAMMATORY BOWEL DISEASE, MICROSCOPIC COLITIS AND COLLAGENOUS COLITIS. 5. Surgical [P], left colon biopsies MILD NONSPECIFIC INFLAMMATION CONSISTENT WITH PREP RELATED CHANGES. THERE ARE NO DIAGNOSTIC FEATURES OF INFLAMMATORY BOWEL DISEASE, MICROSCOPIC COLITIS AND COLLAGENOUS COLITIS. 6. Surgical [P], colon, sigmoid x4, polyp (4) HYPERPLASTIC POLYPS WITH UNDERLYING HYPERPLASTIC LYMPHOID NODULES. NEGATIVE FOR DYSPLASIA.  ASSESSMENT AND PLAN: Rectal bleeding Family history of colon cancer Elevated ALT Epigastric abdominal pain Patient presents with constipation and continued rectal bleeding, suspected to be due to hemorrhoids combined with use of blood thinners. Will plan to institute some therapies for his hemorrhoids and start Linzess to help with his constipation. Patient has had an elevated ALT in the past, but on most recent check his LFTs have normalized. His liver disease work up was unremarkable, suggesting that his prior ALT elevation may be due to fatty liver. If Anusol cream is not effective, then can consider hemorrhoidal banding. - Previously discussed drinking 8 cups of water per day, getting 30 minutes of physical activity per day, taking fiber supplement - Start daily Linzess 145 mcg QD -  Anusol HC cream BID for 7 days - Repeat colonoscopy due in 07/2026 due to family history of colon cancer - RTC 2 months  [name], MD

## 2021-09-11 ENCOUNTER — Other Ambulatory Visit: Payer: Self-pay | Admitting: Cardiology

## 2021-10-27 ENCOUNTER — Encounter (HOSPITAL_COMMUNITY): Payer: Self-pay

## 2021-10-27 ENCOUNTER — Ambulatory Visit (HOSPITAL_COMMUNITY)
Admission: RE | Admit: 2021-10-27 | Discharge: 2021-10-27 | Disposition: A | Discharge status: Home / Self Care | Payer: Medicaid Other | Source: Ambulatory Visit | Attending: Cardiology | Admitting: Cardiology

## 2021-10-27 VITALS — BP 90/62 | HR 62 | Wt 196.8 lb

## 2021-10-27 DIAGNOSIS — Z95810 Presence of automatic (implantable) cardiac defibrillator: Secondary | ICD-10-CM | POA: Diagnosis not present

## 2021-10-27 DIAGNOSIS — Z7902 Long term (current) use of antithrombotics/antiplatelets: Secondary | ICD-10-CM | POA: Insufficient documentation

## 2021-10-27 DIAGNOSIS — Z8719 Personal history of other diseases of the digestive system: Secondary | ICD-10-CM | POA: Insufficient documentation

## 2021-10-27 DIAGNOSIS — Z951 Presence of aortocoronary bypass graft: Secondary | ICD-10-CM | POA: Diagnosis not present

## 2021-10-27 DIAGNOSIS — I252 Old myocardial infarction: Secondary | ICD-10-CM | POA: Diagnosis not present

## 2021-10-27 DIAGNOSIS — I4892 Unspecified atrial flutter: Secondary | ICD-10-CM | POA: Insufficient documentation

## 2021-10-27 DIAGNOSIS — Z955 Presence of coronary angioplasty implant and graft: Secondary | ICD-10-CM | POA: Diagnosis not present

## 2021-10-27 DIAGNOSIS — Z7901 Long term (current) use of anticoagulants: Secondary | ICD-10-CM | POA: Insufficient documentation

## 2021-10-27 DIAGNOSIS — D649 Anemia, unspecified: Secondary | ICD-10-CM | POA: Diagnosis not present

## 2021-10-27 DIAGNOSIS — I959 Hypotension, unspecified: Secondary | ICD-10-CM | POA: Diagnosis not present

## 2021-10-27 DIAGNOSIS — E875 Hyperkalemia: Secondary | ICD-10-CM | POA: Insufficient documentation

## 2021-10-27 DIAGNOSIS — K5900 Constipation, unspecified: Secondary | ICD-10-CM | POA: Insufficient documentation

## 2021-10-27 DIAGNOSIS — I2510 Atherosclerotic heart disease of native coronary artery without angina pectoris: Secondary | ICD-10-CM | POA: Diagnosis present

## 2021-10-27 DIAGNOSIS — R42 Dizziness and giddiness: Secondary | ICD-10-CM | POA: Insufficient documentation

## 2021-10-27 DIAGNOSIS — K625 Hemorrhage of anus and rectum: Secondary | ICD-10-CM | POA: Diagnosis not present

## 2021-10-27 DIAGNOSIS — K219 Gastro-esophageal reflux disease without esophagitis: Secondary | ICD-10-CM | POA: Diagnosis not present

## 2021-10-27 DIAGNOSIS — Z79899 Other long term (current) drug therapy: Secondary | ICD-10-CM | POA: Insufficient documentation

## 2021-10-27 DIAGNOSIS — E785 Hyperlipidemia, unspecified: Secondary | ICD-10-CM | POA: Diagnosis not present

## 2021-10-27 DIAGNOSIS — I480 Paroxysmal atrial fibrillation: Secondary | ICD-10-CM | POA: Diagnosis not present

## 2021-10-27 DIAGNOSIS — I5022 Chronic systolic (congestive) heart failure: Secondary | ICD-10-CM | POA: Insufficient documentation

## 2021-10-27 DIAGNOSIS — Z87891 Personal history of nicotine dependence: Secondary | ICD-10-CM | POA: Diagnosis not present

## 2021-10-27 DIAGNOSIS — I255 Ischemic cardiomyopathy: Secondary | ICD-10-CM | POA: Insufficient documentation

## 2021-10-27 LAB — CBC
HCT: 48.4 % (ref 39.0–52.0)
Hemoglobin: 15.5 g/dL (ref 13.0–17.0)
MCH: 31.7 pg (ref 26.0–34.0)
MCHC: 32 g/dL (ref 30.0–36.0)
MCV: 99 fL (ref 80.0–100.0)
Platelets: 194 10*3/uL (ref 150–400)
RBC: 4.89 MIL/uL (ref 4.22–5.81)
RDW: 13.2 % (ref 11.5–15.5)
WBC: 7.5 10*3/uL (ref 4.0–10.5)
nRBC: 0 % (ref 0.0–0.2)

## 2021-10-27 LAB — BASIC METABOLIC PANEL
Anion gap: 8 (ref 5–15)
BUN: 16 mg/dL (ref 6–20)
CO2: 21 mmol/L — ABNORMAL LOW (ref 22–32)
Calcium: 9.1 mg/dL (ref 8.9–10.3)
Chloride: 114 mmol/L — ABNORMAL HIGH (ref 98–111)
Creatinine, Ser: 1.08 mg/dL (ref 0.61–1.24)
GFR, Estimated: >60 mL/min (ref >60)
Glucose, Bld: 123 mg/dL — ABNORMAL HIGH (ref 70–99)
Potassium: 4.1 mmol/L (ref 3.5–5.1)
Sodium: 143 mmol/L (ref 135–145)

## 2021-10-27 MED ORDER — CARVEDILOL 3.125 MG PO TABS: 3.1250 mg | ORAL_TABLET | Freq: Two times a day (BID) | ORAL | 6 refills | Status: DC

## 2021-10-27 NOTE — Patient Instructions (Addendum)
Thank you for coming in today  Labs were done today, if any labs are abnormal the clinic will call you No news is good news  STOP Plavix  DECREASE Coreg 3.125 mg 1 tablet twice daily   Your physician recommends that you schedule a follow-up appointment in: 4 week in clinic  3-6 months with [name]    Do the following things EVERYDAY: Weigh yourself in the morning before breakfast. Write it down and keep it in a log. Take your medicines as prescribed Eat low salt foods--Limit salt (sodium) to 2000 mg per day.  Stay as active as you can everyday Limit all fluids for the day to less than 2 liters At the [facility], you and your health needs are our priority. As part of our continuing mission to provide you with exceptional heart care, we have created designated Provider Care Teams. These Care Teams include your primary Cardiologist (physician) and Advanced Practice Providers (APPs- Physician Assistants and Nurse Practitioners) who all work together to provide you with the care you need, when you need it.   You may see any of the following providers on your designated Care Team at your next follow up: Dr Glori Bickers Dr Loralie Champagne Dr. Roxana Hires, NP Lyda Jester, [place] [facility] [place], [place] Forestine Na, NP Audry Riles, PharmD   Please be sure to bring in all your medications bottles to every appointment.   If you have any questions or concerns before your next appointment please send [place] a message through [place] or call our office at [phone].    TO LEAVE A MESSAGE FOR THE NURSE SELECT OPTION 2, PLEASE LEAVE A MESSAGE INCLUDING: YOUR NAME DATE OF BIRTH CALL BACK NUMBER REASON FOR CALL**this is important as we prioritize the call backs  YOU WILL RECEIVE A CALL BACK THE SAME DAY AS LONG AS YOU CALL BEFORE 4:00 PM

## 2021-10-27 NOTE — Progress Notes (Signed)
Advanced Heart Failure Clinic Note   Primary Care: Wyatt Douglas, MD HF Cardiologist: [name] EP: Dr Quentin Ore   HPI: Patient is a 36 y.o. with history of early onset CAD s/p CABG, ischemic cardiomyopathy, and LV thrombus. Patient had initial MI in 2012 at age 76, PCI to LAD.  He had inferoposterior MI in 4/22.  LHC showed 3 vessel disease, and patient had CABG x 4. Cardiac MRI in 5/22 showed LV EF 27% with LV thrombus, there was significant viability.  Post-op, patient had GI bleeding and anticoagulation was stopped.  He quit smoking after CABG.    Follow up 10/04/20 carvedilol decreased due to dizziness and repeat echo arranged.  Echo 8/22 EF 25-30%. He was referred to EP for ICD consideration => [employer] ICD.   Admitted 12/03/20 with NSTEMI. Coronary angiogram showed patent LIMA to LAD with occlusion of all other grafts. S/p PCI/DES to native mid LCX 99% stenosis.  Native LAD and RCA occluded. Angina resolved post PCI. Farxiga and Coreg added back, however spiro and losartan held due to hyperkalemia during this admission. His hospitalization was complicated by atrial fibrillation and flutter with RVR. He received IV amiodarone and started on Eliquis.  He underwent DCCV with conversion to NSR on 12/06/20. Discharged home 12/06/20, weight 177.5 lbs.  Patient noted BRBPR, had colonoscopy showing polyps.   He presents to clinic today for routien f/u. Reports increased exertional dyspnea and fatigue, in the setting of frequent hemorrhoidal bleeding over the last week, reports BRBPR. Reports compliance w/ stool softener. Has f/u w/ GI this coming month. Denies CP but reports associated dizziness. BP soft, 90/ 62, checked x 2.   Heart Logic score 0. Device interrogation w/ 0% AT/AF burden. No VT    [employer] device interrogation: Heartlogic 0. No AT/AF. No VT  Labs (10/22): K 4.5, creatinine 0.95, LDL 45 Labs (3/23): K 4.2, creatinine 0.94 Labs (5/23): K 4.5, creatinine  1.19 Labs (6/23): K 4.5, creatinine 1.03, LDL 60, TSH 0.430, HFTs nl   PMH: 1. Hyperlipidemia 2. CAD: MI at age 54 in 2012, PCI to LAD.   - Inferoposterior MI in 4/22: LHC showed 3 vessel disease.  CABG with LIMA-LAD, radial to OM1, sequential SVG-PDA and D1.  - NSTEMI-->LHC (10/22): patent LIMA to LAD, all other grafts occluded. S/p PCI DES native mid LCx 99% stenosis, LAD and RCA totally occluded.  3. Post-operative GI bleeding after CABG 4. Chronic systolic CHF: Ischemic cardiomyopathy.   - Cardiac MRI (5/22): LV EF 27%, RV EF 57%, apical thrombus, extensive viability.  - Echo (8/22) EF 20-25% - Echo (10/22): EF 35-40% - cMRI (10/22): LVEF 29%, RV EF 45%, no LV thrombus, LGE suggestive of prior MI.  - [employer] ICD  5. LV thrombus  - No thrombus on 10/22 cMRI.  6. Prior smoker: Quit 5/22.  7. H/o hyperkalemia 8. Atrial fibrillation/flutter: Paroxysmal.  - DCCV 10/22 9. ABIs (5/22): Normal.  10. Colonic polyps  Current Outpatient Medications  Medication Sig Dispense Refill   amiodarone (PACERONE) 200 MG tablet Take 1 tablet (200 mg total) by mouth daily. 90 tablet 3   apixaban (ELIQUIS) 5 MG TABS tablet Take 1 tablet (5 mg total) by mouth 2 (two) times daily. 180 tablet 3   atorvastatin (LIPITOR) 80 MG tablet Take 1 tablet (80 mg total) by mouth daily. 90 tablet 3   carvedilol (COREG) 6.25 MG tablet Take 1 tablet (6.25 mg total) by mouth 2 (two) times daily with a meal.  180 tablet 3   clopidogrel (PLAVIX) 75 MG tablet Take 1 tablet (75 mg total) by mouth daily. Hold for 7 days starting 07/03/21; may re-start after that time if bleeding has stopped 30 tablet 6   dapagliflozin propanediol (FARXIGA) 10 MG TABS tablet Take 1 tablet (10 mg total) by mouth daily before breakfast. 30 tablet 11   hydrocortisone (ANUSOL-HC) 2.5 % rectal cream Place 1 Application rectally 2 (two) times daily. 30 g 1   hydrOXYzine (ATARAX) 25 MG tablet Take 25 mg by mouth at bedtime.     linaclotide  (LINZESS) 145 MCG CAPS capsule Take 1 capsule (145 mcg total) by mouth daily before breakfast. 90 capsule 3   nitroGLYCERIN (NITROSTAT) 0.4 MG SL tablet Place 0.4 mg under the tongue every 5 (five) minutes x 3 doses as needed for chest pain.     pantoprazole (PROTONIX) 40 MG tablet Take 1 tablet (40 mg total) by mouth daily. 90 tablet 3   sacubitril-valsartan (ENTRESTO) 24-26 MG Take 1 tablet by mouth 2 (two) times daily. 180 tablet 3   sodium zirconium cyclosilicate (LOKELMA) 5 g packet Take 5 g by mouth daily. 30 packet 6   spironolactone (ALDACTONE) 25 MG tablet Take 1 tablet (25 mg total) by mouth daily. 90 tablet 3   No current facility-administered medications for this encounter.   Allergies  Allergen Reactions   Codeine Itching   Social History   Socioeconomic History   Marital status: Single    Spouse name: Not on file   Number of children: Not on file   Years of education: Not on file   Highest education level: High school graduate  Occupational History   Occupation: Not actively working    Comment: applied for disability x 2.   Tobacco Use   Smoking status: Former    Packs/day: 1.00    Years: 22.00    Total pack years: 22.00    Types: Cigarettes, Cigars    Quit date: 06/28/2020    Years since quitting: 1.3   Smokeless tobacco: Never   Tobacco comments:    cigarettes stopped in 2020, [employer] started in 2018-04-05/day  Vaping Use   Vaping Use: Never used  Substance and Sexual Activity   Alcohol use: Never   Drug use: Not Currently    Types: Marijuana   Sexual activity: Never  Other Topics Concern   Not on file  Social History Narrative   Single   Lives with his parents   Trying to get his GED   DeGent date all cultures   Social Determinants of Health   Financial Resource Strain: Medium Risk (12/04/2020)   Overall Financial Resource Strain (CARDIA)    Difficulty of Paying Living Expenses: Somewhat hard  Food Insecurity: No Food Insecurity (12/04/2020)    Hunger Vital Sign    Worried About Running Out of Food in the Last Year: Never true    Ran Out of Food in the Last Year: Never true  Transportation Needs: No Transportation Needs (12/04/2020)   PRAPARE - Hydrologist (Medical): No    Lack of Transportation (Non-Medical): No  Physical Activity: Not on file  Stress: Not on file  Social Connections: Not on file  Intimate Partner Violence: Not on file   Family History  Problem Relation Age of Onset   Colon cancer Father    Colon cancer Paternal Aunt    Colon cancer Paternal Grandfather    Hypertension Neg Hx  Diabetes Neg Hx    Coronary artery disease Neg Hx    Esophageal cancer Neg Hx    Rectal cancer Neg Hx    Stomach cancer Neg Hx    BP 90/62   Pulse 62   Wt 89.3 kg (196 lb 12.8 oz)   SpO2 96%   BMI 25.96 kg/m   Wt Readings from Last 3 Encounters:  10/27/21 89.3 kg (196 lb 12.8 oz)  09/08/21 88.9 kg (196 lb)  08/26/21 86.5 kg (190 lb 9.6 oz)   PHYSICAL EXAM: General:  fatigued appearing, pale, looks much older than actual age. Ambulating w/ cane. No respiratory difficulty HEENT: normal Neck: supple. no JVD. Carotids 2+ bilat; no bruits. No lymphadenopathy or thyromegaly appreciated. Cor: PMI nondisplaced. Regular rate & rhythm. No rubs, gallops or murmurs. Lungs: clear Abdomen: soft, nontender, nondistended. No hepatosplenomegaly. No bruits or masses. Good bowel sounds. Extremities: no cyanosis, clubbing, rash, edema Neuro: alert & oriented x 3, cranial nerves grossly intact. moves all 4 extremities w/o difficulty. Affect pleasant.   ASSESSMENT & PLAN:  1. CAD: early onset CAD s/p MI with LAD PCI in 2012,followed by CABG  x 4 4/22  Admit 10/22 with NSTEMI. LHC this admission with patent LIMA to LAD, all other grafts occluded. S/p PCI DES native mid Lcx 99% stenosis. No chest pain.  - He has been on Plavix for the last 11 months. Plan was to continue through 10/23, but given increased GI  bleeding and symptomatic anemia, will plan to d/c Plavix now  - He is on Eliquis so no ASA.   - Continue atorvastatin 80 mg. Lipids good 6/23 (LDL 60)  2. Chronic Systolic CHF: Ischemic cardiomyopathy.  [place].  Most recent study was a cardiac MRI in 10/22 with LVEF 29% and RVEF 45%.  NYHA class II-III symptoms, confounded by anemia. He is not volume overloaded on exam or by Heartlogic (HL score 0) GDMT has been limited by hypotension & hyperkalemia, K is now controlled with Lokelma.   - Reduce Coreg to 3.125 mg bid given fatigue, hypotension and dizziness  - Continue Farxiga 10 mg daily.  - Continue spironolactone 25 mg daily + Lokelma 5 g daily given history of hyperkalemia.   - Continue Entresto 24/26 bid.  - Check BMP today  3. 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 Anticoagulation previously stopped d/t GI bleed. No thrombus on echo 08/22. No thrombus on cMRI 12/06/20. - He is now on Eliquis 5 mg twice a day.  - CBC today.  4. Hyperkalemia: He has had a tendency towards hyperkalemia.  I think that he will need Lokelma in order to take spironolactone and Entresto.  See above.  5. Atrial flutter/fibrillation: Both arrhythmias noted in 10/22.  He tolerated poorly with worsened symptoms.  He had DCCV in 10/22.  He is maintaining NSR on amiodarone, but amiodarone not ideal in a patient this young.  He saw Dr. Quentin Ore who wants to see clinical stability for 6-12 months prior to atrial fibrillation ablation.  - Continue amiodarone 200 mg daily, LFTs and TSH checked 6/23 and WNL. He will need a regular eye exam.  - Arrange for followup with EP in 9/23 for AF ablation consideration.    - Continue Eliquis.  6. Former Smoker: Last cigarette 06/30/20 7. GI bleeding: Reports recent increase in BRBPR, persistent x 1 wk. Colonoscopy recently with polyps removed.   -  Plan to stop Plavix as outlined above  - Treat constipation with daily stool  softener.  - advised to f/u w GI  8. GERD: Continue  Protonix 40 mg daily.  F/u w/ APP in 4 wks, [name] in 3 months   Lyda Jester, PA-C  10/27/21

## 2021-11-11 ENCOUNTER — Ambulatory Visit (INDEPENDENT_AMBULATORY_CARE_PROVIDER_SITE_OTHER): Payer: Medicaid Other | Admitting: Internal Medicine

## 2021-11-11 ENCOUNTER — Ambulatory Visit (INDEPENDENT_AMBULATORY_CARE_PROVIDER_SITE_OTHER): Payer: Medicaid Other

## 2021-11-11 ENCOUNTER — Encounter: Payer: Self-pay | Admitting: Internal Medicine

## 2021-11-11 VITALS — BP 120/68 | HR 58 | Ht 74.0 in | Wt 194.2 lb

## 2021-11-11 DIAGNOSIS — K649 Unspecified hemorrhoids: Secondary | ICD-10-CM

## 2021-11-11 DIAGNOSIS — R1013 Epigastric pain: Secondary | ICD-10-CM | POA: Diagnosis not present

## 2021-11-11 DIAGNOSIS — I5022 Chronic systolic (congestive) heart failure: Secondary | ICD-10-CM | POA: Diagnosis not present

## 2021-11-11 DIAGNOSIS — K5900 Constipation, unspecified: Secondary | ICD-10-CM

## 2021-11-11 DIAGNOSIS — Z800 Family history of malignant neoplasm of digestive organs: Secondary | ICD-10-CM | POA: Diagnosis not present

## 2021-11-11 MED ORDER — LINACLOTIDE 145 MCG PO CAPS: 145.0000 ug | ORAL_CAPSULE | Freq: Every day | ORAL | 1 refills | Status: DC

## 2021-11-11 NOTE — Patient Instructions (Addendum)
If you are age 38 or younger, your body mass index should be between 19-25. Your Body mass index is 24.93 kg/m. If this is out of the aformentioned range listed, please consider follow up with your Primary Care Provider.  ________________________________________________________  The [place] [facility] providers would like to encourage you to use [facility] to communicate with providers for non-urgent requests or questions.  Due to long hold times on the telephone, sending your provider a message by [facility] may be a faster and more efficient way to get a response.  Please allow 48 business hours for a response.  Please remember that this is for non-urgent requests.  _______________________________________________________  We have sent the following medications to your pharmacy for you to pick up at your convenience: Linzess  You will need a follow up in 6 months (March 2024).  We will contact you to schedule this appointment.  Thank you for entrusting me with your care and choosing [facility].  Dr Lorenso Courier

## 2021-11-11 NOTE — Progress Notes (Signed)
Chief Complaint: Constipation, hemorrhoids  HPI : 36 year old male with history of CAD s/p CABG and PCI, HFrEF (35-40%), A-fib on Eliquis, depression presents for follow up of constipation and hemorrhoids  Interval History: He is having one BM every day with Linzess daily. He was taken off of the Plavix because of rectal bleeding. He is now only on Eliquis. After he was taken off of the Plavix, he is no longer having any rectal bleeding. He is drinking more water. Anusol HC cream did not really help.   Wt Readings from Last 3 Encounters:  11/11/21 194 lb 3.2 oz (88.1 kg)  10/27/21 196 lb 12.8 oz (89.3 kg)  09/08/21 196 lb (88.9 kg)   Current Outpatient Medications  Medication Sig Dispense Refill   amiodarone (PACERONE) 200 MG tablet Take 1 tablet (200 mg total) by mouth daily. 90 tablet 3   apixaban (ELIQUIS) 5 MG TABS tablet Take 1 tablet (5 mg total) by mouth 2 (two) times daily. 180 tablet 3   atorvastatin (LIPITOR) 80 MG tablet Take 1 tablet (80 mg total) by mouth daily. 90 tablet 3   carvedilol (COREG) 3.125 MG tablet Take 1 tablet (3.125 mg total) by mouth 2 (two) times daily with a meal. 60 tablet 6   dapagliflozin propanediol (FARXIGA) 10 MG TABS tablet Take 1 tablet (10 mg total) by mouth daily before breakfast. 30 tablet 11   hydrocortisone (ANUSOL-HC) 2.5 % rectal cream Place 1 Application rectally 2 (two) times daily. 30 g 1   hydrOXYzine (ATARAX) 25 MG tablet Take 25 mg by mouth at bedtime.     linaclotide (LINZESS) 145 MCG CAPS capsule Take 1 capsule (145 mcg total) by mouth daily before breakfast. 90 capsule 3   nitroGLYCERIN (NITROSTAT) 0.4 MG SL tablet Place 0.4 mg under the tongue every 5 (five) minutes x 3 doses as needed for chest pain.     pantoprazole (PROTONIX) 40 MG tablet Take 1 tablet (40 mg total) by mouth daily. 90 tablet 3   sacubitril-valsartan (ENTRESTO) 24-26 MG Take 1 tablet by mouth 2 (two) times daily. 180 tablet 3   sodium zirconium cyclosilicate  (LOKELMA) 5 g packet Take 5 g by mouth daily. 30 packet 6   spironolactone (ALDACTONE) 25 MG tablet Take 1 tablet (25 mg total) by mouth daily. 90 tablet 3   clopidogrel (PLAVIX) 75 MG tablet Take 1 tablet (75 mg total) by mouth daily. Hold for 7 days starting 07/03/21; may re-start after that time if bleeding has stopped (Patient not taking: Reported on 11/11/2021) 30 tablet 6   No current facility-administered medications for this visit.   Physical Exam: BP 120/68   Pulse (!) 58   Ht '6\' 2"'$  (1.88 m)   Wt 194 lb 3.2 oz (88.1 kg)   BMI 24.93 kg/m  Constitutional: Pleasant,well-developed, male in no acute distress. HEENT: Normocephalic and atraumatic. Conjunctivae are normal. No scleral icterus. Cardiovascular: Normal rate, regular rhythm.  Pulmonary/chest: Effort normal and breath sounds normal. No wheezing, rales or rhonchi. Abdominal: Soft, nondistended, non-tender Extremities: No edema Neurological: Alert and oriented to person place and time. Skin: Skin is warm and dry. No rashes noted. Psychiatric: Normal mood and affect. Behavior is normal.  Labs 04/2021: CBC nml. TSH nml. CMP with mildly elevated ALT of 69  Labs 05/2021: BMP unremarkable  Labs 05/2021: Lipase, iron/TIBC, IgG, A1AT, AMA nml.   Labs 07/2021: CMP unremarkable  CT A/P w/contrast 07/01/21: IMPRESSION: Large stool burden and gaseous distention throughout the colon.  No acute findings in the abdomen or pelvis.  EGD 07/30/21: - Normal esophagus. - Gastritis. Biopsied. - Normal examined duodenum. Path: 1. Surgical [P], gastric random FRAGMENTS OF GASTRIC MUCOSA WITH SUPERFICIAL MUCOSAL EROSIONS AND MINIMAL VASCULAR ECTASIA. H. PYLORI, INTESTINAL METAPLASIA, ATROPHY AND DYSPLASIA ARE NOT IDENTIFIED.  Colonoscopy 07/30/21: - The examined portion of the ileum was normal. - Two 3 to 4 mm polyps in the transverse colon and in the cecum, removed with a cold snare. Resected and retrieved. - Localized inflammation was found  in the sigmoid colon. - Four 3 to 10 mm polyps in the sigmoid colon, removed with a cold snare. Resected and retrieved. - Non-bleeding internal hemorrhoids. Path: 2. Surgical [P], right colon biopsies MILD NONSPECIFIC INFLAMMATION CONSISTENT WITH PREP RELATED CHANGES. ONE FRAGMENT HAS A PORTION OF A SUBMUCOSAL LIPOMA. THERE ARE NO DIAGNOSTIC FEATURES OF INFLAMMATORY BOWEL DISEASE, MICROSCOPIC COLITIS AND COLLAGENOUS COLITIS. 3. Surgical [P], colon, cecum, transverse, polyp (2) INFLAMMATORY PSEUDOPOLYP AND A HYPERPLASTIC POLYP. NEGATIVE FOR DYSPLASIA. 4. Surgical [P], colon, transverse MILD NONSPECIFIC INFLAMMATION CONSISTENT WITH PREP RELATED CHANGES. THERE ARE NO DIAGNOSTIC FEATURES OF INFLAMMATORY BOWEL DISEASE, MICROSCOPIC COLITIS AND COLLAGENOUS COLITIS. 5. Surgical [P], left colon biopsies MILD NONSPECIFIC INFLAMMATION CONSISTENT WITH PREP RELATED CHANGES. THERE ARE NO DIAGNOSTIC FEATURES OF INFLAMMATORY BOWEL DISEASE, MICROSCOPIC COLITIS AND COLLAGENOUS COLITIS. 6. Surgical [P], colon, sigmoid x4, polyp (4) HYPERPLASTIC POLYPS WITH UNDERLYING HYPERPLASTIC LYMPHOID NODULES. NEGATIVE FOR DYSPLASIA.  ASSESSMENT AND PLAN: Constipation History of colon polyps Family history of colon cancer Hemorrhoids Patient overall appears to be doing better on the Linzess therapy with improvement in his constipation and ab pain. He is having regular BMs. His issues with rectal bleeding have resolved after he was taken off of Plavix therapy. - Previously discussed drinking 8 cups of water per day, getting 30 minutes of physical activity per day, taking fiber supplement - Cont daily Linzess 145 mcg QD. Refilled. - Repeat colonoscopy due in 07/2026 due to family history of colon cancer - RTC 6 months  [name], MD

## 2021-11-14 LAB — CUP PACEART REMOTE DEVICE CHECK
Battery Remaining Longevity: 156 mo
Battery Remaining Percentage: 100 %
Brady Statistic RA Percent Paced: 0 %
Brady Statistic RV Percent Paced: 0 %
Date Time Interrogation Session: 20230912050100
HighPow Impedance: 78 Ohm
Implantable Lead Implant Date: 20221213
Implantable Lead Implant Date: 20221213
Implantable Lead Location: 753859
Implantable Lead Location: 753860
Implantable Lead Model: 673
Implantable Lead Model: 7841
Implantable Lead Serial Number: 1211072
Implantable Lead Serial Number: 160080
Implantable Pulse Generator Implant Date: 20221213
Lead Channel Impedance Value: 467 Ohm
Lead Channel Impedance Value: 526 Ohm
Lead Channel Setting Pacing Amplitude: 2 V
Lead Channel Setting Pacing Amplitude: 2.5 V
Lead Channel Setting Pacing Pulse Width: 0.4 ms
Lead Channel Setting Sensing Sensitivity: 0.5 mV
Pulse Gen Serial Number: 617464

## 2021-11-24 ENCOUNTER — Ambulatory Visit (HOSPITAL_COMMUNITY)
Admission: RE | Admit: 2021-11-24 | Discharge: 2021-11-24 | Disposition: A | Discharge status: Home / Self Care | Payer: Medicaid Other | Source: Ambulatory Visit | Attending: Family Medicine | Admitting: Family Medicine

## 2021-11-24 ENCOUNTER — Encounter (HOSPITAL_COMMUNITY): Payer: Self-pay

## 2021-11-24 VITALS — BP 92/72 | HR 62 | Wt 194.0 lb

## 2021-11-24 DIAGNOSIS — D649 Anemia, unspecified: Secondary | ICD-10-CM | POA: Insufficient documentation

## 2021-11-24 DIAGNOSIS — R5383 Other fatigue: Secondary | ICD-10-CM

## 2021-11-24 DIAGNOSIS — Z7984 Long term (current) use of oral hypoglycemic drugs: Secondary | ICD-10-CM | POA: Diagnosis not present

## 2021-11-24 DIAGNOSIS — K219 Gastro-esophageal reflux disease without esophagitis: Secondary | ICD-10-CM | POA: Insufficient documentation

## 2021-11-24 DIAGNOSIS — I513 Intracardiac thrombosis, not elsewhere classified: Secondary | ICD-10-CM

## 2021-11-24 DIAGNOSIS — F17290 Nicotine dependence, other tobacco product, uncomplicated: Secondary | ICD-10-CM | POA: Insufficient documentation

## 2021-11-24 DIAGNOSIS — I252 Old myocardial infarction: Secondary | ICD-10-CM | POA: Insufficient documentation

## 2021-11-24 DIAGNOSIS — K5900 Constipation, unspecified: Secondary | ICD-10-CM | POA: Insufficient documentation

## 2021-11-24 DIAGNOSIS — Z951 Presence of aortocoronary bypass graft: Secondary | ICD-10-CM | POA: Diagnosis not present

## 2021-11-24 DIAGNOSIS — I255 Ischemic cardiomyopathy: Secondary | ICD-10-CM | POA: Diagnosis not present

## 2021-11-24 DIAGNOSIS — I25119 Atherosclerotic heart disease of native coronary artery with unspecified angina pectoris: Secondary | ICD-10-CM | POA: Insufficient documentation

## 2021-11-24 DIAGNOSIS — E875 Hyperkalemia: Secondary | ICD-10-CM | POA: Diagnosis not present

## 2021-11-24 DIAGNOSIS — I4892 Unspecified atrial flutter: Secondary | ICD-10-CM | POA: Diagnosis not present

## 2021-11-24 DIAGNOSIS — Z79899 Other long term (current) drug therapy: Secondary | ICD-10-CM | POA: Insufficient documentation

## 2021-11-24 DIAGNOSIS — Z8719 Personal history of other diseases of the digestive system: Secondary | ICD-10-CM

## 2021-11-24 DIAGNOSIS — Z7901 Long term (current) use of anticoagulants: Secondary | ICD-10-CM | POA: Diagnosis not present

## 2021-11-24 DIAGNOSIS — Z955 Presence of coronary angioplasty implant and graft: Secondary | ICD-10-CM | POA: Diagnosis present

## 2021-11-24 DIAGNOSIS — I5022 Chronic systolic (congestive) heart failure: Secondary | ICD-10-CM | POA: Insufficient documentation

## 2021-11-24 DIAGNOSIS — Z87891 Personal history of nicotine dependence: Secondary | ICD-10-CM

## 2021-11-24 LAB — BASIC METABOLIC PANEL
Anion gap: 7 (ref 5–15)
BUN: 13 mg/dL (ref 6–20)
CO2: 20 mmol/L — ABNORMAL LOW (ref 22–32)
Calcium: 9.4 mg/dL (ref 8.9–10.3)
Chloride: 114 mmol/L — ABNORMAL HIGH (ref 98–111)
Creatinine, Ser: 0.98 mg/dL (ref 0.61–1.24)
GFR, Estimated: >60 mL/min (ref >60)
Glucose, Bld: 89 mg/dL (ref 70–99)
Potassium: 4 mmol/L (ref 3.5–5.1)
Sodium: 141 mmol/L (ref 135–145)

## 2021-11-24 NOTE — Patient Instructions (Addendum)
Thank you for coming in today  Labs were done today, if any labs are abnormal the clinic will call you No news is good news  PLEASE FOLLOW UP WITH EP OFFICE  You have been referred to sleep study center they will call you for appointment details   Your physician recommends that you schedule a follow-up appointment in:  Please keep [name] appointment     Do the following things EVERYDAY: Weigh yourself in the morning before breakfast. Write it down and keep it in a log. Take your medicines as prescribed Eat low salt foods--Limit salt (sodium) to 2000 mg per day.  Stay as active as you can everyday Limit all fluids for the day to less than 2 liters  At the [facility], you and your health needs are our priority. As part of our continuing mission to provide you with exceptional heart care, we have created designated Provider Care Teams. These Care Teams include your primary Cardiologist (physician) and Advanced Practice Providers (APPs- Physician Assistants and Nurse Practitioners) who all work together to provide you with the care you need, when you need it.   You may see any of the following providers on your designated Care Team at your next follow up: Dr Glori Bickers Dr Loralie Champagne Dr. Roxana Hires, NP Lyda Jester, [place] [facility] [place], [place] Forestine Na, NP Audry Riles, PharmD   Please be sure to bring in all your medications bottles to every appointment.   If you have any questions or concerns before your next appointment please send [place] a message through [place] or call our office at [phone].    TO LEAVE A MESSAGE FOR THE NURSE SELECT OPTION 2, PLEASE LEAVE A MESSAGE INCLUDING: YOUR NAME DATE OF BIRTH CALL BACK NUMBER REASON FOR CALL**this is important as we prioritize the call backs  YOU WILL RECEIVE A CALL BACK THE SAME DAY AS LONG AS YOU CALL BEFORE 4:00 PM

## 2021-11-24 NOTE — Progress Notes (Signed)
Advanced Heart Failure Clinic Note   Primary Care: Leonie Douglas, MD HF Cardiologist: [name] EP: Dr Quentin Ore   HPI: Patient is a 36 y.o. with history of early onset CAD s/p CABG, ischemic cardiomyopathy, and LV thrombus. Patient had initial MI in 2012 at age 73, PCI to LAD.  He had inferoposterior MI in 4/22.  LHC showed 3 vessel disease, and patient had CABG x 4. Cardiac MRI in 5/22 showed LV EF 27% with LV thrombus, there was significant viability.  Post-op, patient had GI bleeding and anticoagulation was stopped.  He quit smoking after CABG.    Follow up 10/04/20 carvedilol decreased due to dizziness and repeat echo arranged.  Echo 8/22 EF 25-30%. He was referred to EP for ICD consideration => [employer] ICD.   Admitted 12/03/20 with NSTEMI. Coronary angiogram showed patent LIMA to LAD with occlusion of all other grafts. S/p PCI/DES to native mid LCX 99% stenosis.  Native LAD and RCA occluded. Angina resolved post PCI. Farxiga and Coreg added back, however spiro and losartan held due to hyperkalemia during this admission. His hospitalization was complicated by atrial fibrillation and flutter with RVR. He received IV amiodarone and started on Eliquis.  He underwent DCCV with conversion to NSR on 12/06/20. Discharged home 12/06/20, weight 177.5 lbs.  Patient noted BRBPR, had colonoscopy showing polyps.   Follow up 8/23, continued with BRBPR, dizziness and low BP. Coreg reduced to 3.125 mg bid and Plavix stopped.  Today he returns for HF follow up. Overall feeling fine. He has occasional dyspnea with ADLs or lifting > 10 lbs, but does OK if he takes his time. He continues with generalized and daytime fatigue. Occasional positional dizziness. Denies palpitations, CP, abnormal bleeding, edema, or PND/Orthopnea. Appetite ok. No fever or chills. Weight at home 192-194 pounds. Taking all medications. No further rectal bleeding with stopping Plavix.  [employer] device interrogation:  HL score 3, average HR 58, activity level 0.6 hr/day, no AT/AF, no VT (personally reviewed).  Labs (10/22): K 4.5, creatinine 0.95, LDL 45 Labs (3/23): K 4.2, creatinine 0.94 Labs (5/23): K 4.5, creatinine 1.19 Labs (6/23): K 4.5, creatinine 1.03, LDL 60, TSH 0.430, HFTs nl  Labs (8/23): K 4.1, creatinine 1.08  PMH: 1. Hyperlipidemia 2. CAD: MI at age 21 in 2012, PCI to LAD.   - Inferoposterior MI in 4/22: LHC showed 3 vessel disease.  CABG with LIMA-LAD, radial to OM1, sequential SVG-PDA and D1.  - NSTEMI-->LHC (10/22): patent LIMA to LAD, all other grafts occluded. S/p PCI DES native mid LCx 99% stenosis, LAD and RCA totally occluded.  3. Post-operative GI bleeding after CABG 4. Chronic systolic CHF: Ischemic cardiomyopathy.   - Cardiac MRI (5/22): LV EF 27%, RV EF 57%, apical thrombus, extensive viability.  - Echo (8/22) EF 20-25% - Echo (10/22): EF 35-40% - cMRI (10/22): LVEF 29%, RV EF 45%, no LV thrombus, LGE suggestive of prior MI.  - [employer] ICD  5. LV thrombus  - No thrombus on 10/22 cMRI.  6. Prior smoker: Quit 5/22.  7. H/o hyperkalemia 8. Atrial fibrillation/flutter: Paroxysmal.  - DCCV 10/22 9. ABIs (5/22): Normal.  10. Colonic polyps  Current Outpatient Medications  Medication Sig Dispense Refill   amiodarone (PACERONE) 200 MG tablet Take 1 tablet (200 mg total) by mouth daily. 90 tablet 3   apixaban (ELIQUIS) 5 MG TABS tablet Take 1 tablet (5 mg total) by mouth 2 (two) times daily. 180 tablet 3   atorvastatin (LIPITOR) 80  MG tablet Take 1 tablet (80 mg total) by mouth daily. 90 tablet 3   carvedilol (COREG) 3.125 MG tablet Take 1 tablet (3.125 mg total) by mouth 2 (two) times daily with a meal. 60 tablet 6   dapagliflozin propanediol (FARXIGA) 10 MG TABS tablet Take 1 tablet (10 mg total) by mouth daily before breakfast. 30 tablet 11   hydrocortisone (ANUSOL-HC) 2.5 % rectal cream Place 1 Application rectally 2 (two) times daily. 30 g 1   hydrOXYzine  (ATARAX) 25 MG tablet Take 25 mg by mouth at bedtime.     linaclotide (LINZESS) 145 MCG CAPS capsule Take 1 capsule (145 mcg total) by mouth daily before breakfast. 90 capsule 1   nitroGLYCERIN (NITROSTAT) 0.4 MG SL tablet Place 0.4 mg under the tongue every 5 (five) minutes x 3 doses as needed for chest pain.     pantoprazole (PROTONIX) 40 MG tablet Take 1 tablet (40 mg total) by mouth daily. 90 tablet 3   sacubitril-valsartan (ENTRESTO) 24-26 MG Take 1 tablet by mouth 2 (two) times daily. 180 tablet 3   sodium zirconium cyclosilicate (LOKELMA) 5 g packet Take 5 g by mouth daily. 30 packet 6   spironolactone (ALDACTONE) 25 MG tablet Take 1 tablet (25 mg total) by mouth daily. 90 tablet 3   clopidogrel (PLAVIX) 75 MG tablet Take 1 tablet (75 mg total) by mouth daily. Hold for 7 days starting 07/03/21; may re-start after that time if bleeding has stopped (Patient not taking: Reported on 11/24/2021) 30 tablet 6   No current facility-administered medications for this encounter.   Allergies  Allergen Reactions   Codeine Itching   Social History   Socioeconomic History   Marital status: Single    Spouse name: Not on file   Number of children: Not on file   Years of education: Not on file   Highest education level: High school graduate  Occupational History   Occupation: Not actively working    Comment: applied for disability x 2.   Tobacco Use   Smoking status: Former    Packs/day: 1.00    Years: 22.00    Total pack years: 22.00    Types: Cigarettes, Cigars    Quit date: 06/28/2020    Years since quitting: 1.4   Smokeless tobacco: Never   Tobacco comments:    cigarettes stopped in 2020, swisher sweets started in 2018-04-05/day  Vaping Use   Vaping Use: Never used  Substance and Sexual Activity   Alcohol use: Never   Drug use: Not Currently    Types: Marijuana   Sexual activity: Never  Other Topics Concern   Not on file  Social History Narrative   Single   Lives with his parents    Trying to get his GED   DeGent date all cultures   Social Determinants of Health   Financial Resource Strain: Medium Risk (12/04/2020)   Overall Financial Resource Strain (CARDIA)    Difficulty of Paying Living Expenses: Somewhat hard  Food Insecurity: No Food Insecurity (12/04/2020)   Hunger Vital Sign    Worried About Running Out of Food in the Last Year: Never true    Ran Out of Food in the Last Year: Never true  Transportation Needs: No Transportation Needs (12/04/2020)   PRAPARE - Hydrologist (Medical): No    Lack of Transportation (Non-Medical): No  Physical Activity: Not on file  Stress: Not on file  Social Connections: Not on file  Intimate Partner  Violence: Not on file   Family History  Problem Relation Age of Onset   Colon cancer Father    Colon cancer Paternal Aunt    Colon cancer Paternal Grandfather    Hypertension Neg Hx    Diabetes Neg Hx    Coronary artery disease Neg Hx    Esophageal cancer Neg Hx    Rectal cancer Neg Hx    Stomach cancer Neg Hx    BP 92/72   Pulse 62   Wt 88 kg (194 lb)   SpO2 98%   BMI 24.91 kg/m   Wt Readings from Last 3 Encounters:  11/24/21 88 kg (194 lb)  11/11/21 88.1 kg (194 lb 3.2 oz)  10/27/21 89.3 kg (196 lb 12.8 oz)   PHYSICAL EXAM: General:  NAD. No resp difficulty, walked into clinic with cane, appears older than stated age, pale. HEENT: Normal Neck: Supple. No JVD. Carotids 2+ bilat; no bruits. No lymphadenopathy or thryomegaly appreciated. Cor: PMI nondisplaced. Regular rate & rhythm. No rubs, gallops or murmurs. Lungs: Clear Abdomen: Soft, nontender, nondistended. No hepatosplenomegaly. No bruits or masses. Good bowel sounds. Extremities: No cyanosis, clubbing, rash, edema Neuro: Alert & oriented x 3, cranial nerves grossly intact. Moves all 4 extremities w/o difficulty. Affect pleasant.  ASSESSMENT & PLAN:  1. CAD: early onset CAD s/p MI with LAD PCI in 2012,followed by CABG  x 4  4/22  Admit 10/22 with NSTEMI. LHC with patent LIMA to LAD, all other grafts occluded. S/p PCI DES native mid Lcx 99% stenosis. No chest pain.  - He is now off Plavix (stopped 8/23 due to increased BRBPR). - He is on Eliquis so no ASA.   - Continue atorvastatin 80 mg. Lipids good 6/23 (LDL 60)  2. Chronic Systolic CHF: Ischemic cardiomyopathy.  [place].  Most recent study was a cardiac MRI in 10/22 with LVEF 29% and RVEF 45%.  NYHA class II-III symptoms, confounded by anemia and general physical deconditioning. He is not volume overloaded on exam or by device. GDMT has been limited by hypotension & hyperkalemia, K is now controlled with Lokelma.   - Continue Coreg 3.125 mg bid (recently reduced due to fatigue, dizziness and low BP). - Continue Farxiga 10 mg daily.  - Continue spironolactone 25 mg daily + Lokelma 5 g daily given history of hyperkalemia.  BMET today. - Continue Entresto 24/26 bid. BP borderline low today, may need to switch to losartan if remains low and he is symptomatic. 3. 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 Anticoagulation previously stopped d/t GI bleed. No thrombus on echo 08/22. No thrombus on cMRI 12/06/20. - Continue Eliquis 5 mg bid. 4. Hyperkalemia: He has had a tendency towards hyperkalemia.  I think that he will need Lokelma in order to take spironolactone and Entresto.  See above.  5. Atrial flutter/fibrillation: Both arrhythmias noted in 10/22.  He tolerated poorly with worsened symptoms.  He had DCCV in 10/22.  He is maintaining NSR on amiodarone, but amiodarone not ideal in a patient this young.  He saw Dr. Quentin Ore who wants to see clinical stability for 6-12 months prior to atrial fibrillation ablation.  - Continue amiodarone 200 mg daily, LFTs and TSH checked 6/23 and WNL. He will need a regular eye exam.  - He needs follow up with EP AF ablation consideration.   Will send message to EP office to arrange. - Continue Eliquis.  6. Former Smoker: Last  cigarette 06/30/20. 7. GI bleeding: Colonoscopy recently with polyps  removed.  Had BRBPR, now resolved after stopping Plavix. - Treat constipation with daily stool softener + Linzess.  - He is followed by GI.  8. GERD: Continue Protonix 40 mg daily. 9. Daytime & generalized fatigue: likely multifactorial. Will arrange in-lab sleep study to evaluate for sleep apnea.  Follow up in 2 months with [name] as scheduled.  [place], FNP-BC  11/24/21

## 2021-11-24 NOTE — Progress Notes (Signed)
Called EP office [phone] left voicemail for Ashlyn the scheduler to call and get pt a follow appointment.

## 2021-11-24 NOTE — Addendum Note (Signed)
Encounter addended by: Payton Mccallum, RN on: 11/24/2021 10:15 AM  Actions taken: Clinical Note Signed

## 2021-11-27 NOTE — Progress Notes (Signed)
Remote ICD transmission.   

## 2021-12-09 ENCOUNTER — Encounter: Payer: Self-pay | Admitting: Cardiology

## 2021-12-21 NOTE — Progress Notes (Deleted)
Electrophysiology Office Note Date: 12/21/2021  ID:  Wyatt Donovan, DOB 20-Oct-1985, MRN [id number]  PCP: Leonie Douglas, MD Primary Cardiologist: Loralie Champagne, MD Electrophysiologist: [name], MD   CC: Routine ICD follow-up  Wyatt Donovan is a 36 y.o. male seen today for [name], MD for routine electrophysiology followup. Since last being seen in our clinic the patient reports doing ***.  he denies chest pain, palpitations, dyspnea, PND, orthopnea, nausea, vomiting, dizziness, syncope, edema, weight gain, or early satiety.     {He/she (caps):30048} has not had ICD shocks.   Device History: Engineer, agricultural ICD implanted 01/2021 for CHF  Past Medical History:  Diagnosis Date   Atrial fibrillation ([place])    Atypical chest pain    CAD (coronary artery disease)    CHF (congestive heart failure) ([facility])    Dyslipidemia    Ex-smoker    History of depression    Hyperlipidemia    Insomnia    Ischemic cardiomyopathy    Ejection fraction 40-45%   NSTEMI (non-ST elevated myocardial infarction) ([place]) 08/01/2007   Treated with a bare metal stent to the proximal LAD, pt states MI x3   Suicide attempt ([place]) 01/30/2001   Past Surgical History:  Procedure Laterality Date   ADENOIDECTOMY     CARDIOVERSION N/A 12/06/2020   Procedure: CARDIOVERSION;  Surgeon: Larey Dresser, MD;  Location: [facility];  Service: Cardiovascular;  Laterality: N/A;   COLONOSCOPY     CORONARY ARTERY BYPASS GRAFT N/A 07/03/2020   Procedure: CORONARY ARTERY BYPASS GRAFTING (CABG) times four using left internal mammary artery, right arm radial artery and right leg saphenous vein;  Surgeon: Lajuana Matte, MD;  Location: [facility];  Service: Open Heart Surgery;  Laterality: N/A;   CORONARY STENT INTERVENTION N/A 12/03/2020   Procedure: CORONARY STENT INTERVENTION;  Surgeon: Martinique, Peter M, MD;  Location: [facility];  Service: Cardiovascular;  Laterality:  N/A;   CORONARY STENT PLACEMENT     Bare metal stent to proximal LAD   ICD IMPLANT N/A 02/10/2021   Procedure: ICD IMPLANT;  Surgeon: [name], MD;  Location: [facility];  Service: Cardiovascular;  Laterality: N/A;   LEFT HEART CATH AND CORONARY ANGIOGRAPHY N/A 07/01/2020   Procedure: LEFT HEART CATH AND CORONARY ANGIOGRAPHY;  Surgeon: Lorretta Harp, MD;  Location: [facility];  Service: Cardiovascular;  Laterality: N/A;   LEFT HEART CATH AND CORS/GRAFTS ANGIOGRAPHY N/A 12/03/2020   Procedure: LEFT HEART CATH AND CORS/GRAFTS ANGIOGRAPHY;  Surgeon: Martinique, Peter M, MD;  Location: [facility];  Service: Cardiovascular;  Laterality: N/A;   RADIAL ARTERY HARVEST Right 07/03/2020   Procedure: RADIAL ARTERY HARVEST;  Surgeon: Lajuana Matte, MD;  Location: [facility];  Service: Open Heart Surgery;  Laterality: Right;   TEE WITHOUT CARDIOVERSION N/A 07/03/2020   Procedure: TRANSESOPHAGEAL ECHOCARDIOGRAM (TEE);  Surgeon: Lajuana Matte, MD;  Location: [facility];  Service: Open Heart Surgery;  Laterality: N/A;   TYMPANOSTOMY TUBE PLACEMENT     when I was a kid   UPPER GASTROINTESTINAL ENDOSCOPY      Current Outpatient Medications  Medication Sig Dispense Refill   amiodarone (PACERONE) 200 MG tablet Take 1 tablet (200 mg total) by mouth daily. 90 tablet 3   apixaban (ELIQUIS) 5 MG TABS tablet Take 1 tablet (5 mg total) by mouth 2 (two) times daily. 180 tablet 3   atorvastatin (LIPITOR) 80 MG tablet Take 1 tablet (  80 mg total) by mouth daily. 90 tablet 3   carvedilol (COREG) 3.125 MG tablet Take 1 tablet (3.125 mg total) by mouth 2 (two) times daily with a meal. 60 tablet 6   clopidogrel (PLAVIX) 75 MG tablet Take 1 tablet (75 mg total) by mouth daily. Hold for 7 days starting 07/03/21; may re-start after that time if bleeding has stopped (Patient not taking: Reported on 11/24/2021) 30 tablet 6   dapagliflozin propanediol (FARXIGA) 10 MG TABS tablet Take 1 tablet (10  mg total) by mouth daily before breakfast. 30 tablet 11   hydrocortisone (ANUSOL-HC) 2.5 % rectal cream Place 1 Application rectally 2 (two) times daily. 30 g 1   hydrOXYzine (ATARAX) 25 MG tablet Take 25 mg by mouth at bedtime.     linaclotide (LINZESS) 145 MCG CAPS capsule Take 1 capsule (145 mcg total) by mouth daily before breakfast. 90 capsule 1   nitroGLYCERIN (NITROSTAT) 0.4 MG SL tablet Place 0.4 mg under the tongue every 5 (five) minutes x 3 doses as needed for chest pain.     pantoprazole (PROTONIX) 40 MG tablet Take 1 tablet (40 mg total) by mouth daily. 90 tablet 3   sacubitril-valsartan (ENTRESTO) 24-26 MG Take 1 tablet by mouth 2 (two) times daily. 180 tablet 3   sodium zirconium cyclosilicate (LOKELMA) 5 g packet Take 5 g by mouth daily. 30 packet 6   spironolactone (ALDACTONE) 25 MG tablet Take 1 tablet (25 mg total) by mouth daily. 90 tablet 3   No current facility-administered medications for this visit.    Allergies:   Codeine   Social History: Social History   Socioeconomic History   Marital status: Single    Spouse name: Not on file   Number of children: Not on file   Years of education: Not on file   Highest education level: High school graduate  Occupational History   Occupation: Not actively working    Comment: applied for disability x 2.   Tobacco Use   Smoking status: Former    Packs/day: 1.00    Years: 22.00    Total pack years: 22.00    Types: Cigarettes, Cigars    Quit date: 06/28/2020    Years since quitting: 1.4   Smokeless tobacco: Never   Tobacco comments:    cigarettes stopped in 2020, swisher sweets started in 2018-04-05/day  Vaping Use   Vaping Use: Never used  Substance and Sexual Activity   Alcohol use: Never   Drug use: Not Currently    Types: Marijuana   Sexual activity: Never  Other Topics Concern   Not on file  Social History Narrative   Single   Lives with his parents   Trying to get his GED   DeGent date all cultures    Social Determinants of Health   Financial Resource Strain: Medium Risk (12/04/2020)   Overall Financial Resource Strain (CARDIA)    Difficulty of Paying Living Expenses: Somewhat hard  Food Insecurity: No Food Insecurity (12/04/2020)   Hunger Vital Sign    Worried About Running Out of Food in the Last Year: Never true    Ran Out of Food in the Last Year: Never true  Transportation Needs: No Transportation Needs (12/04/2020)   PRAPARE - Hydrologist (Medical): No    Lack of Transportation (Non-Medical): No  Physical Activity: Not on file  Stress: Not on file  Social Connections: Not on file  Intimate Partner Violence: Not on file  Family History: Family History  Problem Relation Age of Onset   Colon cancer Father    Colon cancer Paternal Aunt    Colon cancer Paternal Grandfather    Hypertension Neg Hx    Diabetes Neg Hx    Coronary artery disease Neg Hx    Esophageal cancer Neg Hx    Rectal cancer Neg Hx    Stomach cancer Neg Hx     Review of Systems: All other systems reviewed and are otherwise negative except as noted above.   Physical Exam: There were no vitals filed for this visit.   GEN- The patient is well appearing, alert and oriented x 3 today.   HEENT: normocephalic, atraumatic; sclera clear, conjunctiva pink; hearing intact; oropharynx clear; neck supple, no JVP Lymph- no cervical lymphadenopathy Lungs- Clear to ausculation bilaterally, normal work of breathing.  No wheezes, rales, rhonchi Heart- {Blank single:19197::"Regular","Irregularly irregular"}  rate and rhythm, no murmurs, rubs or gallops, PMI not laterally displaced GI- soft, non-tender, non-distended, bowel sounds present, no hepatosplenomegaly Extremities- no clubbing or cyanosis. {EDEMA MBWGY:65993} peripheral edema; DP/PT/radial pulses 2+ bilaterally MS- no significant deformity or atrophy Skin- warm and dry, no rash or lesion; ICD pocket well healed Psych- euthymic  mood, full affect Neuro- strength and sensation are intact  ICD interrogation- reviewed in detail today,  See PACEART report  EKG:  EKG {ACTION; IS/IS TTS:[id number]} ordered today. Personal review of EKG ordered {Blank single:19197::"today","***"} shows ***  Recent Labs: 03/13/2021: B Natriuretic Peptide 235.4 08/26/2021: ALT 32; TSH 0.430 10/27/2021: Hemoglobin 15.5; Platelets 194 11/24/2021: BUN 13; Creatinine, Ser 0.98; Potassium 4.0; Sodium 141   Wt Readings from Last 3 Encounters:  11/24/21 194 lb (88 kg)  11/11/21 194 lb 3.2 oz (88.1 kg)  10/27/21 196 lb 12.8 oz (89.3 kg)     Other studies Reviewed: Additional studies/ records that were reviewed today include: Previous EP office notes.   Assessment and Plan:  1.  Chronic systolic dysfunction s/p Child psychotherapist single:19197::"***","single chamber ICD","dual chamber ICD","CRT-D","S-ICD"}  euvolemic today Stable on an appropriate medical regimen Normal ICD function See Pace Art report No changes today  2. CAD s/p CABG Denies s/s ischemia Continue plavix  3. PAF/AFL Continue eliquis for CVA prophylaxis EKG today shows *** on amiodarone Amiodarone ***   Current medicines are reviewed at length with the patient today.   =  Labs/ tests ordered today include: *** No orders of the defined types were placed in this encounter.    Disposition:   Follow up with {EPMDS:28135} {Blank single:19197::"in 2 weeks","in 4 weeks","in 3 months","in 6 months","in 12 months","as usual post gen change"}    Signed, Shirley Friar, PA-C  12/21/2021 2:16 PM  [facility] [address] [place] [address] [phone] (office) [phone] (fax)

## 2021-12-24 ENCOUNTER — Ambulatory Visit: Payer: Medicaid Other | Admitting: Student

## 2021-12-24 DIAGNOSIS — I25119 Atherosclerotic heart disease of native coronary artery with unspecified angina pectoris: Secondary | ICD-10-CM

## 2021-12-24 DIAGNOSIS — I5022 Chronic systolic (congestive) heart failure: Secondary | ICD-10-CM

## 2021-12-24 DIAGNOSIS — I4892 Unspecified atrial flutter: Secondary | ICD-10-CM

## 2022-01-20 NOTE — Progress Notes (Unsigned)
Electrophysiology Office Note Date: 01/21/2022  ID:  Wyatt Donovan, DOB [birth year], MRN [id number]  PCP: Wyatt Douglas, MD Primary Cardiologist: Wyatt Champagne, MD Electrophysiologist: Wyatt Epley, MD   CC: Routine ICD follow-up  Wyatt Donovan is a 36 y.o. male seen today for Wyatt Epley, MD for routine electrophysiology followup. Since last being seen in our clinic the patient reports doing OK. He has continued to have intermittent BRBPR attributed to hemorrhoids. Pending GI follow up. He had 2 episodes of chest pain last week, last 15-30 minutes.  He waited 15 minutes prior to taking NTG which gave him gradual symptom resolution. The pain came on suddenly at rest, and resolved gradually after nitro.  No further. Denies syncope.    Device History: Engineer, agricultural ICD implanted 2022 for ICM/CHF  Past Medical History:  Diagnosis Date   Atrial fibrillation ([place])    Atypical chest pain    CAD (coronary artery disease)    CHF (congestive heart failure) (HCC)    Dyslipidemia    Ex-smoker    History of depression    Hyperlipidemia    Insomnia    Ischemic cardiomyopathy    Ejection fraction 40-45%   NSTEMI (non-ST elevated myocardial infarction) ([place]) 08/01/2007   Treated with a bare metal stent to the proximal LAD, pt states MI x3   Suicide attempt ([place]) 01/30/2001   Past Surgical History:  Procedure Laterality Date   ADENOIDECTOMY     CARDIOVERSION N/A 12/06/2020   Procedure: CARDIOVERSION;  Surgeon: Wyatt Dresser, MD;  Location: [facility];  Service: Cardiovascular;  Laterality: N/A;   COLONOSCOPY     CORONARY ARTERY BYPASS GRAFT N/A 07/03/2020   Procedure: CORONARY ARTERY BYPASS GRAFTING (CABG) times four using left internal mammary artery, right arm radial artery and right leg saphenous vein;  Surgeon: Wyatt Matte, MD;  Location: [facility];  Service: Open Heart Surgery;  Laterality: N/A;   CORONARY STENT INTERVENTION N/A  12/03/2020   Procedure: CORONARY STENT INTERVENTION;  Surgeon: Martinique, Wyatt M, MD;  Location: [facility];  Service: Cardiovascular;  Laterality: N/A;   CORONARY STENT PLACEMENT     Bare metal stent to proximal LAD   ICD IMPLANT N/A 02/10/2021   Procedure: ICD IMPLANT;  Surgeon: Wyatt Epley, MD;  Location: [facility];  Service: Cardiovascular;  Laterality: N/A;   LEFT HEART CATH AND CORONARY ANGIOGRAPHY N/A 07/01/2020   Procedure: LEFT HEART CATH AND CORONARY ANGIOGRAPHY;  Surgeon: Wyatt Harp, MD;  Location: [facility];  Service: Cardiovascular;  Laterality: N/A;   LEFT HEART CATH AND CORS/GRAFTS ANGIOGRAPHY N/A 12/03/2020   Procedure: LEFT HEART CATH AND CORS/GRAFTS ANGIOGRAPHY;  Surgeon: Martinique, Wyatt M, MD;  Location: [facility];  Service: Cardiovascular;  Laterality: N/A;   RADIAL ARTERY HARVEST Right 07/03/2020   Procedure: RADIAL ARTERY HARVEST;  Surgeon: Wyatt Matte, MD;  Location: [facility];  Service: Open Heart Surgery;  Laterality: Right;   TEE WITHOUT CARDIOVERSION N/A 07/03/2020   Procedure: TRANSESOPHAGEAL ECHOCARDIOGRAM (TEE);  Surgeon: Wyatt Matte, MD;  Location: [facility];  Service: Open Heart Surgery;  Laterality: N/A;   TYMPANOSTOMY TUBE PLACEMENT     when I was a kid   UPPER GASTROINTESTINAL ENDOSCOPY      Current Outpatient Medications  Medication Sig Dispense Refill   amiodarone (PACERONE) 200 MG tablet Take 1 tablet (200 mg total) by mouth daily. 90 tablet 3   apixaban (ELIQUIS)  5 MG TABS tablet Take 1 tablet (5 mg total) by mouth 2 (two) times daily. 180 tablet 3   atorvastatin (LIPITOR) 80 MG tablet Take 1 tablet (80 mg total) by mouth daily. 90 tablet 3   carvedilol (COREG) 3.125 MG tablet Take 1 tablet (3.125 mg total) by mouth 2 (two) times daily with a meal. 60 tablet 6   dapagliflozin propanediol (FARXIGA) 10 MG TABS tablet Take 1 tablet (10 mg total) by mouth daily before breakfast. 30 tablet 11    hydrocortisone (ANUSOL-HC) 2.5 % rectal cream Place 1 Application rectally 2 (two) times daily. 30 g 1   hydrOXYzine (ATARAX) 25 MG tablet Take 25 mg by mouth at bedtime.     linaclotide (LINZESS) 145 MCG CAPS capsule Take 1 capsule (145 mcg total) by mouth daily before breakfast. 90 capsule 1   nitroGLYCERIN (NITROSTAT) 0.4 MG SL tablet Place 0.4 mg under the tongue every 5 (five) minutes x 3 doses as needed for chest pain.     pantoprazole (PROTONIX) 40 MG tablet Take 1 tablet (40 mg total) by mouth daily. 90 tablet 3   sacubitril-valsartan (ENTRESTO) 24-26 MG Take 1 tablet by mouth 2 (two) times daily. 180 tablet 3   sodium zirconium cyclosilicate (LOKELMA) 5 g packet Take 5 g by mouth daily. 30 packet 6   spironolactone (ALDACTONE) 25 MG tablet Take 1 tablet (25 mg total) by mouth daily. 90 tablet 3   No current facility-administered medications for this visit.    Allergies:   Codeine   Social History: Social History   Socioeconomic History   Marital status: Single    Spouse name: Not on file   Number of children: Not on file   Years of education: Not on file   Highest education level: High school graduate  Occupational History   Occupation: Not actively working    Comment: applied for disability x 2.   Tobacco Use   Smoking status: Former    Packs/day: 1.00    Years: 22.00    Total pack years: 22.00    Types: Cigarettes, Cigars    Quit date: 06/28/2020    Years since quitting: 1.5   Smokeless tobacco: Never   Tobacco comments:    cigarettes stopped in 2020, swisher sweets started in 2018-04-05/day  Vaping Use   Vaping Use: Never used  Substance and Sexual Activity   Alcohol use: Never   Drug use: Not Currently    Types: Marijuana   Sexual activity: Never  Other Topics Concern   Not on file  Social History Narrative   Single   Lives with his parents   Trying to get his GED   DeGent date all cultures   Social Determinants of Health   Financial Resource Strain:  Medium Risk (12/04/2020)   Overall Financial Resource Strain (CARDIA)    Difficulty of Paying Living Expenses: Somewhat hard  Food Insecurity: No Food Insecurity (12/04/2020)   Hunger Vital Sign    Worried About Running Out of Food in the Last Year: Never true    Ran Out of Food in the Last Year: Never true  Transportation Needs: No Transportation Needs (12/04/2020)   PRAPARE - Hydrologist (Medical): No    Lack of Transportation (Non-Medical): No  Physical Activity: Not on file  Stress: Not on file  Social Connections: Not on file  Intimate Partner Violence: Not on file    Family History: Family History  Problem Relation Age of Onset  Colon cancer Father    Colon cancer Paternal Aunt    Colon cancer Paternal Grandfather    Hypertension Neg Hx    Diabetes Neg Hx    Coronary artery disease Neg Hx    Esophageal cancer Neg Hx    Rectal cancer Neg Hx    Stomach cancer Neg Hx     Review of Systems: All other systems reviewed and are otherwise negative except as noted above.   Physical Exam: Vitals:   01/21/22 0820  BP: 110/64  Pulse: (!) 51  SpO2: 97%  Weight: 193 lb 3.2 oz (87.6 kg)  Height: '6\' 1"'$  (1.854 Donovan)     GEN- The patient is well appearing, alert and oriented x 3 today.   HEENT: normocephalic, atraumatic; sclera clear, conjunctiva pink; hearing intact; oropharynx clear; neck supple, no JVP Lymph- no cervical lymphadenopathy Lungs- Clear to ausculation bilaterally, normal work of breathing.  No wheezes, rales, rhonchi Heart- Regular  rate and rhythm, no murmurs, rubs or gallops, PMI not laterally displaced GI- soft, non-tender, non-distended, bowel sounds present, no hepatosplenomegaly Extremities- no clubbing or cyanosis. No peripheral edema; DP/PT/radial pulses 2+ bilaterally MS- no significant deformity or atrophy Skin- warm and dry, no rash or lesion; ICD pocket well healed Psych- euthymic mood, full affect Neuro- strength and  sensation are intact  ICD interrogation- reviewed in detail today,  See PACEART report  EKG:  EKG is ordered today. Personal review of EKG ordered today shows sinus brady 51 bpm  Recent Labs: 03/13/2021: B Natriuretic Peptide 235.4 08/26/2021: ALT 32; TSH 0.430 10/27/2021: Hemoglobin 15.5; Platelets 194 11/24/2021: BUN 13; Creatinine, Ser 0.98; Potassium 4.0; Sodium 141   Wt Readings from Last 3 Encounters:  01/21/22 193 lb 3.2 oz (87.6 kg)  11/24/21 194 lb (88 kg)  11/11/21 194 lb 3.2 oz (88.1 kg)     Other studies Reviewed: Additional studies/ records that were reviewed today include: Previous EP office notes.   Assessment and Plan:  1.  Chronic systolic dysfunction s/p [employer] dual chamber ICD  euvolemic today Stable on an appropriate medical regimen Normal ICD function See Pace Art report No changes today  2. Paroxysmal atrial fib/flutter EKG today shows sinus brady 51 bpm Continue eliquis for CHA2DS2/VASc of at least 4. Burden 0%   3. CAD s/p CABG Continue plavix Atypical episode of CP x 2 last week. Reviewed alarm symptoms.  Pt states [name] told him to only take 2 NTG prior to calling EMS. Reviewed that he should not wait more than 5 minutes prior to taking his first NTG. Wait another 5 minutes, take NTG; if pain has not improved should at that point call 911  4. BRBPR Likely hemorrhoidal per pt. CBC today. He is pending GI f/u  Current medicines are reviewed at length with the patient today.   =  Labs/ tests ordered today include:  Orders Placed This Encounter  Procedures   Basic metabolic panel   CBC   EKG 12-Lead    Disposition:   Follow up with Dr. Quentin Ore in 6 months    Signed, Shirley Friar, PA-C  01/21/2022 8:25 AM  [facility] [address] [place]  [place] [address] [phone] (office) [phone] (fax)

## 2022-01-21 ENCOUNTER — Ambulatory Visit: Payer: Medicaid Other | Attending: Student | Admitting: Student

## 2022-01-21 ENCOUNTER — Encounter: Payer: Self-pay | Admitting: Student

## 2022-01-21 VITALS — BP 110/64 | HR 51 | Ht 73.0 in | Wt 193.2 lb

## 2022-01-21 DIAGNOSIS — I255 Ischemic cardiomyopathy: Secondary | ICD-10-CM | POA: Diagnosis not present

## 2022-01-21 DIAGNOSIS — I5022 Chronic systolic (congestive) heart failure: Secondary | ICD-10-CM

## 2022-01-21 DIAGNOSIS — I25119 Atherosclerotic heart disease of native coronary artery with unspecified angina pectoris: Secondary | ICD-10-CM

## 2022-01-21 LAB — CUP PACEART INCLINIC DEVICE CHECK
Date Time Interrogation Session: 20231122083719
HighPow Impedance: 75 Ohm
Implantable Lead Connection Status: 753985
Implantable Lead Connection Status: 753985
Implantable Lead Implant Date: 20221213
Implantable Lead Implant Date: 20221213
Implantable Lead Location: 753859
Implantable Lead Location: 753860
Implantable Lead Model: 673
Implantable Lead Model: 7841
Implantable Lead Serial Number: 1211072
Implantable Lead Serial Number: 160080
Implantable Pulse Generator Implant Date: 20221213
Lead Channel Impedance Value: 468 Ohm
Lead Channel Impedance Value: 519 Ohm
Lead Channel Pacing Threshold Amplitude: 0.5 V
Lead Channel Pacing Threshold Amplitude: 0.7 V
Lead Channel Pacing Threshold Pulse Width: 0.4 ms
Lead Channel Pacing Threshold Pulse Width: 1 ms
Lead Channel Sensing Intrinsic Amplitude: 3.4 mV
Lead Channel Sensing Intrinsic Amplitude: 7.7 mV
Lead Channel Setting Pacing Amplitude: 2 V
Lead Channel Setting Pacing Amplitude: 2.5 V
Lead Channel Setting Pacing Pulse Width: 0.4 ms
Lead Channel Setting Sensing Sensitivity: 0.5 mV
Pulse Gen Serial Number: 617464
Zone Setting Status: 755011

## 2022-01-21 NOTE — Patient Instructions (Signed)
Medication Instructions:  Your physician recommends that you continue on your current medications as directed. Please refer to the Current Medication list given to you today.  *If you need a refill on your cardiac medications before your next appointment, please call your pharmacy*   Lab Work: TODAY: BMET, CBC  If you have labs (blood work) drawn today and your tests are completely normal, you will receive your results only by: [place] (if you have MyChart) OR A paper copy in the mail If you have any lab test that is abnormal or we need to change your treatment, we will call you to review the results.   Follow-Up: At [facility], you and your health needs are our priority.  As part of our continuing mission to provide you with exceptional heart care, we have created designated Provider Care Teams.  These Care Teams include your primary Cardiologist (physician) and Advanced Practice Providers (APPs -  Physician Assistants and Nurse Practitioners) who all work together to provide you with the care you need, when you need it.  We recommend signing up for the patient portal called "MyChart".  Sign up information is provided on this After Visit Summary.  MyChart is used to connect with patients for Virtual Visits (Telemedicine).  Patients are able to view lab/test results, encounter notes, upcoming appointments, etc.  Non-urgent messages can be sent to your provider as well.   To learn more about what you can do with MyChart, go to [email or web address].    Your next appointment:   6 month(s)  The format for your next appointment:   In Person  Provider:   Lars Mage, MD     Important Information About Sugar

## 2022-01-22 LAB — BASIC METABOLIC PANEL
BUN/Creatinine Ratio: 15 (ref 9–20)
BUN: 17 mg/dL (ref 6–20)
CO2: 22 mmol/L (ref 20–29)
Calcium: 9.4 mg/dL (ref 8.7–10.2)
Chloride: 108 mmol/L — ABNORMAL HIGH (ref 96–106)
Creatinine, Ser: 1.13 mg/dL (ref 0.76–1.27)
Glucose: 87 mg/dL (ref 70–99)
Potassium: 4.6 mmol/L (ref 3.5–5.2)
Sodium: 145 mmol/L — ABNORMAL HIGH (ref 134–144)
eGFR: 85 mL/min/{1.73_m2} (ref >59)

## 2022-01-22 LAB — CBC
Hematocrit: 48 % (ref 37.5–51.0)
Hemoglobin: 15.6 g/dL (ref 13.0–17.7)
MCH: 30.5 pg (ref 26.6–33.0)
MCHC: 32.5 g/dL (ref 31.5–35.7)
MCV: 94 fL (ref 79–97)
Platelets: 204 10*3/uL (ref 150–450)
RBC: 5.12 x10E6/uL (ref 4.14–5.80)
RDW: 12.8 % (ref 11.6–15.4)
WBC: 7.1 10*3/uL (ref 3.4–10.8)

## 2022-01-27 ENCOUNTER — Telehealth: Payer: Self-pay | Admitting: Cardiology

## 2022-01-27 ENCOUNTER — Encounter (HOSPITAL_COMMUNITY): Payer: Self-pay | Admitting: Cardiology

## 2022-01-27 ENCOUNTER — Ambulatory Visit (HOSPITAL_COMMUNITY)
Admission: RE | Admit: 2022-01-27 | Discharge: 2022-01-27 | Disposition: A | Discharge status: Home / Self Care | Payer: Medicaid Other | Source: Ambulatory Visit | Attending: Cardiology | Admitting: Cardiology

## 2022-01-27 VITALS — BP 100/60 | HR 61 | Wt 194.6 lb

## 2022-01-27 DIAGNOSIS — Z951 Presence of aortocoronary bypass graft: Secondary | ICD-10-CM | POA: Insufficient documentation

## 2022-01-27 DIAGNOSIS — I5022 Chronic systolic (congestive) heart failure: Secondary | ICD-10-CM | POA: Diagnosis not present

## 2022-01-27 DIAGNOSIS — I513 Intracardiac thrombosis, not elsewhere classified: Secondary | ICD-10-CM | POA: Diagnosis not present

## 2022-01-27 DIAGNOSIS — I959 Hypotension, unspecified: Secondary | ICD-10-CM | POA: Insufficient documentation

## 2022-01-27 DIAGNOSIS — I255 Ischemic cardiomyopathy: Secondary | ICD-10-CM | POA: Insufficient documentation

## 2022-01-27 DIAGNOSIS — I252 Old myocardial infarction: Secondary | ICD-10-CM | POA: Insufficient documentation

## 2022-01-27 DIAGNOSIS — I4892 Unspecified atrial flutter: Secondary | ICD-10-CM | POA: Diagnosis not present

## 2022-01-27 DIAGNOSIS — Z955 Presence of coronary angioplasty implant and graft: Secondary | ICD-10-CM | POA: Insufficient documentation

## 2022-01-27 DIAGNOSIS — E875 Hyperkalemia: Secondary | ICD-10-CM | POA: Insufficient documentation

## 2022-01-27 DIAGNOSIS — Z79899 Other long term (current) drug therapy: Secondary | ICD-10-CM | POA: Insufficient documentation

## 2022-01-27 DIAGNOSIS — F17290 Nicotine dependence, other tobacco product, uncomplicated: Secondary | ICD-10-CM | POA: Diagnosis not present

## 2022-01-27 DIAGNOSIS — K625 Hemorrhage of anus and rectum: Secondary | ICD-10-CM | POA: Insufficient documentation

## 2022-01-27 DIAGNOSIS — I25119 Atherosclerotic heart disease of native coronary artery with unspecified angina pectoris: Secondary | ICD-10-CM

## 2022-01-27 DIAGNOSIS — Z7901 Long term (current) use of anticoagulants: Secondary | ICD-10-CM | POA: Diagnosis not present

## 2022-01-27 LAB — CBC
HCT: 52.3 % — ABNORMAL HIGH (ref 39.0–52.0)
Hemoglobin: 16.4 g/dL (ref 13.0–17.0)
MCH: 30.9 pg (ref 26.0–34.0)
MCHC: 31.4 g/dL (ref 30.0–36.0)
MCV: 98.5 fL (ref 80.0–100.0)
Platelets: 202 10*3/uL (ref 150–400)
RBC: 5.31 MIL/uL (ref 4.22–5.81)
RDW: 13.3 % (ref 11.5–15.5)
WBC: 6.8 10*3/uL (ref 4.0–10.5)
nRBC: 0 % (ref 0.0–0.2)

## 2022-01-27 LAB — COMPREHENSIVE METABOLIC PANEL
ALT: 41 U/L (ref 0–44)
AST: 28 U/L (ref 15–41)
Albumin: 3.9 g/dL (ref 3.5–5.0)
Alkaline Phosphatase: 56 U/L (ref 38–126)
Anion gap: 13 (ref 5–15)
BUN: 19 mg/dL (ref 6–20)
CO2: 23 mmol/L (ref 22–32)
Calcium: 9.9 mg/dL (ref 8.9–10.3)
Chloride: 107 mmol/L (ref 98–111)
Creatinine, Ser: 1.29 mg/dL — ABNORMAL HIGH (ref 0.61–1.24)
GFR, Estimated: >60 mL/min (ref >60)
Glucose, Bld: 75 mg/dL (ref 70–99)
Potassium: 4.8 mmol/L (ref 3.5–5.1)
Sodium: 143 mmol/L (ref 135–145)
Total Bilirubin: 1 mg/dL (ref 0.3–1.2)
Total Protein: 6.9 g/dL (ref 6.5–8.1)

## 2022-01-27 LAB — TSH: TSH: 0.812 u[IU]/mL (ref 0.350–4.500)

## 2022-01-27 MED ORDER — NITROGLYCERIN 0.4 MG SL SUBL: 0.4000 mg | SUBLINGUAL_TABLET | SUBLINGUAL | 3 refills | Status: DC | PRN

## 2022-01-27 MED ORDER — AMIODARONE HCL 200 MG PO TABS: 100.0000 mg | ORAL_TABLET | Freq: Every day | ORAL | 3 refills | Status: DC

## 2022-01-27 NOTE — Progress Notes (Signed)
Advanced Heart Failure Clinic Note   Primary Care: Leonie Douglas, MD HF Cardiologist: [name] EP: Dr Quentin Ore   HPI: Patient is a 36 y.o. with history of early onset CAD s/p CABG, ischemic cardiomyopathy, and LV thrombus. Patient had initial MI in 2012 at age 19, PCI to LAD.  He had inferoposterior MI in 4/22.  LHC showed 3 vessel disease, and patient had CABG x 4. Cardiac MRI in 5/22 showed LV EF 27% with LV thrombus, there was significant viability.  Post-op, patient had GI bleeding and anticoagulation was stopped.  He quit smoking after CABG.    Follow up 10/04/20 carvedilol decreased due to dizziness and repeat echo arranged.  Echo 8/22 EF 25-30%. He was referred to EP for ICD consideration => [employer] ICD.   Admitted 12/03/20 with NSTEMI. Coronary angiogram showed patent LIMA to LAD with occlusion of all other grafts. S/p PCI/DES to native mid LCX 99% stenosis.  Native LAD and RCA occluded. Angina resolved post PCI. Farxiga and Coreg added back, however spiro and losartan held due to hyperkalemia during this admission. His hospitalization was complicated by atrial fibrillation and flutter with RVR. He received IV amiodarone and started on Eliquis.  He underwent DCCV with conversion to NSR on 12/06/20. Discharged home 12/06/20, weight 177.5 lbs.  Patient noted BRBPR, had colonoscopy showing polyps.   Follow up 8/23, continued with BRBPR, dizziness and low BP. Coreg reduced to 3.125 mg bid and Plavix stopped.  Today he returns for HF follow up. Rare rectal bleeding thought to be due to hemorrhoids.  He is short of breath walking up stairs or walking long distances.  He generally does ok walking shorter distances such as into the office today.  He does not take Lasix, weight is stable today.  He uses a cane for balance.  He denies claudication.  He had some chest pain while watching TV several weeks ago, taking NTG did not help.  No recent chest pain and no exertional chest pain.    ECG (personally reviewed): NSR, 1st degree AVB 278 msec, old inferior MI  Labs (10/22): K 4.5, creatinine 0.95, LDL 45 Labs (3/23): K 4.2, creatinine 0.94 Labs (5/23): K 4.5, creatinine 1.19 Labs (6/23): K 4.5, creatinine 1.03, LDL 60, TSH 0.430, HFTs nl  Labs (8/23): K 4.1, creatinine 1.08 Labs (11/23): K 4.6, creatinine 1.13  PMH: 1. Hyperlipidemia 2. CAD: MI at age 17 in 2012, PCI to LAD.   - Inferoposterior MI in 4/22: LHC showed 3 vessel disease.  CABG with LIMA-LAD, radial to OM1, sequential SVG-PDA and D1.  - NSTEMI-->LHC (10/22): patent LIMA to LAD, all other grafts occluded. S/p PCI DES native mid LCx 99% stenosis, LAD and RCA totally occluded.  3. Post-operative GI bleeding after CABG 4. Chronic systolic CHF: Ischemic cardiomyopathy.   - Cardiac MRI (5/22): LV EF 27%, RV EF 57%, apical thrombus, extensive viability.  - Echo (8/22) EF 20-25% - Echo (10/22): EF 35-40% - cMRI (10/22): LVEF 29%, RV EF 45%, no LV thrombus, LGE suggestive of prior MI.  - [employer] ICD  5. LV thrombus  - No thrombus on 10/22 cMRI.  6. Prior smoker: Quit 5/22.  7. H/o hyperkalemia 8. Atrial fibrillation/flutter: Paroxysmal.  - DCCV 10/22 9. ABIs (5/22): Normal.  10. Colonic polyps  Current Outpatient Medications  Medication Sig Dispense Refill   apixaban (ELIQUIS) 5 MG TABS tablet Take 1 tablet (5 mg total) by mouth 2 (two) times daily. 180 tablet 3  atorvastatin (LIPITOR) 80 MG tablet Take 1 tablet (80 mg total) by mouth daily. 90 tablet 3   carvedilol (COREG) 3.125 MG tablet Take 1 tablet (3.125 mg total) by mouth 2 (two) times daily with a meal. 60 tablet 6   dapagliflozin propanediol (FARXIGA) 10 MG TABS tablet Take 1 tablet (10 mg total) by mouth daily before breakfast. 30 tablet 11   hydrocortisone (ANUSOL-HC) 25 MG suppository Place 25 mg rectally as needed for hemorrhoids or anal itching.     hydrOXYzine (ATARAX) 25 MG tablet Take 25 mg by mouth at bedtime.     linaclotide  (LINZESS) 145 MCG CAPS capsule Take 1 capsule (145 mcg total) by mouth daily before breakfast. 90 capsule 1   pantoprazole (PROTONIX) 40 MG tablet Take 1 tablet (40 mg total) by mouth daily. 90 tablet 3   sacubitril-valsartan (ENTRESTO) 24-26 MG Take 1 tablet by mouth 2 (two) times daily. 180 tablet 3   sodium zirconium cyclosilicate (LOKELMA) 5 g packet Take 5 g by mouth daily. 30 packet 6   spironolactone (ALDACTONE) 25 MG tablet Take 1 tablet (25 mg total) by mouth daily. 90 tablet 3   amiodarone (PACERONE) 200 MG tablet Take 0.5 tablets (100 mg total) by mouth daily. 45 tablet 3   nitroGLYCERIN (NITROSTAT) 0.4 MG SL tablet Place 1 tablet (0.4 mg total) under the tongue every 5 (five) minutes x 3 doses as needed for chest pain. 15 tablet 3   No current facility-administered medications for this encounter.   Allergies  Allergen Reactions   Codeine Itching   Social History   Socioeconomic History   Marital status: Single    Spouse name: Not on file   Number of children: Not on file   Years of education: Not on file   Highest education level: High school graduate  Occupational History   Occupation: Not actively working    Comment: applied for disability x 2.   Tobacco Use   Smoking status: Former    Packs/day: 1.00    Years: 22.00    Total pack years: 22.00    Types: Cigarettes, Cigars    Quit date: 06/28/2020    Years since quitting: 1.5   Smokeless tobacco: Never   Tobacco comments:    cigarettes stopped in 2020, swisher sweets started in 2018-04-05/day  Vaping Use   Vaping Use: Never used  Substance and Sexual Activity   Alcohol use: Never   Drug use: Not Currently    Types: Marijuana   Sexual activity: Never  Other Topics Concern   Not on file  Social History Narrative   Single   Lives with his parents   Trying to get his GED   DeGent date all cultures   Social Determinants of Health   Financial Resource Strain: Medium Risk (12/04/2020)   Overall Financial  Resource Strain (CARDIA)    Difficulty of Paying Living Expenses: Somewhat hard  Food Insecurity: No Food Insecurity (12/04/2020)   Hunger Vital Sign    Worried About Running Out of Food in the Last Year: Never true    Ran Out of Food in the Last Year: Never true  Transportation Needs: No Transportation Needs (12/04/2020)   PRAPARE - Hydrologist (Medical): No    Lack of Transportation (Non-Medical): No  Physical Activity: Not on file  Stress: Not on file  Social Connections: Not on file  Intimate Partner Violence: Not on file   Family History  Problem Relation Age of  Onset   Colon cancer Father    Colon cancer Paternal Aunt    Colon cancer Paternal Grandfather    Hypertension Neg Hx    Diabetes Neg Hx    Coronary artery disease Neg Hx    Esophageal cancer Neg Hx    Rectal cancer Neg Hx    Stomach cancer Neg Hx    BP 100/60   Pulse 61   Wt 88.3 kg (194 lb 9.6 oz)   SpO2 98%   BMI 25.67 kg/m   Wt Readings from Last 3 Encounters:  01/27/22 88.3 kg (194 lb 9.6 oz)  01/21/22 87.6 kg (193 lb 3.2 oz)  11/24/21 88 kg (194 lb)   PHYSICAL EXAM: General: NAD Neck: No JVD, no thyromegaly or thyroid nodule.  Lungs: Clear to auscultation bilaterally with normal respiratory effort. CV: Nondisplaced PMI.  Heart regular S1/S2, no S3/S4, no murmur.  No peripheral edema.  No carotid bruit.  Normal pedal pulses.  Abdomen: Soft, nontender, no hepatosplenomegaly, no distention.  Skin: Intact without lesions or rashes.  Neurologic: Alert and oriented x 3.  Psych: Normal affect. Extremities: No clubbing or cyanosis.  HEENT: Normal.   ASSESSMENT & PLAN:  1. CAD: early onset CAD s/p MI with LAD PCI in 2012,followed by CABG  x 4 4/22  Admit 10/22 with NSTEMI. LHC with patent LIMA to LAD, all other grafts occluded. S/p PCI DES native mid Lcx 99% stenosis. No chest pain.  - He is now off Plavix (stopped 8/23 due to increased BRBPR). - He is on Eliquis so no ASA.    - Continue atorvastatin 80 mg. Lipids good 6/23 (LDL 60)  2. Chronic Systolic CHF: Ischemic cardiomyopathy.  [place].  Most recent study was a cardiac MRI in 10/22 with LVEF 29% and RVEF 45%.  NYHA class II symptoms, suspect deconditioning continues to play a role though he seems to be getting stronger. He is not volume overloaded on exam. GDMT has been limited by hypotension & hyperkalemia, K is now controlled with Lokelma.   - Continue Coreg 3.125 mg bid (he did not tolerate increase due to low BP). - Continue Farxiga 10 mg daily.  - Continue spironolactone 25 mg daily + Lokelma 5 g daily given history of hyperkalemia.  BMET/BNP today.  - Continue Entresto 24/26 bid.  - I will repeat echo at followup in 3 months.  - I would like evaluation for cardiac contractility modulation for this patient, will have him see Dr. Quentin Ore.  3. 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  No thrombus on echo 08/22. No thrombus on cMRI 12/06/20. - Continue Eliquis 5 mg bid. 4. Hyperkalemia: He has had a tendency towards hyperkalemia.  I think that he will need Lokelma in order to take spironolactone and Entresto.  See above.  5. Atrial flutter/fibrillation: Both arrhythmias noted in 10/22.  He tolerated poorly with worsened symptoms.  He had DCCV in 10/22.  He is maintaining NSR on amiodarone, but amiodarone not ideal in a patient this young.  He saw Dr. Quentin Ore who wanted to see clinical stability for 6-12 months prior to atrial fibrillation ablation. No symptomatic AF.  - Decrease amiodarone to 100 mg daily. Check LFTs and TSH today. He will need a regular eye exam.  - He has been clinically stable, needs to be seen back by EP to assess for AF ablation.  This was not addressed at last visit.  Will set him up to see Dr. Quentin Ore.  - Continue Eliquis.  6. Former Smoker: Last cigarette 06/30/20. 7. GI bleeding: Colonoscopy recently with polyps removed.  Had BRBPR likely due to hemorrhoids, now resolved after  stopping Plavix. 8. Daytime & generalized fatigue: Still needs sleep study, will try to schedule.   Followup in 3 months with echo.  Also needs appointment with Dr. Quentin Ore to consider AF ablation as well as cardiac contractility modulation.  These issues were not addressed at last EP appt.    Loralie Champagne,  01/27/22

## 2022-01-27 NOTE — Patient Instructions (Addendum)
EKG done today.  Labs done today. We will contact you only if your labs are abnormal.  DECREASE Amiodarone to '100mg'$  (1 tablet) by mouth daily.   No other medication changes were made. Please continue all current medications as prescribed.  Your physician has recommended that you have a sleep study. This test records several body functions during sleep, including: brain activity, eye movement, oxygen and carbon dioxide blood levels, heart rate and rhythm, breathing rate and rhythm, the flow of air through your mouth and nose, snoring, body muscle movements, and chest and belly movement. This office will contact you to schedule an appointment.  Your physician recommends that you schedule a follow-up appointment in: 3 months with an echo prior to your appointment.  Your physician has requested that you have an echocardiogram. Echocardiography is a painless test that uses sound waves to create images of your heart. It provides your doctor with information about the size and shape of your heart and how well your heart's chambers and valves are working. This procedure takes approximately one hour. There are no restrictions for this procedure. Please do NOT wear cologne, perfume, aftershave, or lotions (deodorant is allowed). Please arrive 15 minutes prior to your appointment time.  If you have any questions or concerns before your next appointment please send [place] a message through [place] or call our office at [phone].    TO LEAVE A MESSAGE FOR THE NURSE SELECT OPTION 2, PLEASE LEAVE A MESSAGE INCLUDING: YOUR NAME DATE OF BIRTH CALL BACK NUMBER REASON FOR CALL**this is important as we prioritize the call backs  YOU WILL RECEIVE A CALL BACK THE SAME DAY AS LONG AS YOU CALL BEFORE 4:00 PM   Do the following things EVERYDAY: Weigh yourself in the morning before breakfast. Write it down and keep it in a log. Take your medicines as prescribed Eat low salt foods--Limit salt (sodium) to 2000 mg  per day.  Stay as active as you can everyday Limit all fluids for the day to less than 2 liters   At the [facility], you and your health needs are our priority. As part of our continuing mission to provide you with exceptional heart care, we have created designated Provider Care Teams. These Care Teams include your primary Cardiologist (physician) and Advanced Practice Providers (APPs- Physician Assistants and Nurse Practitioners) who all work together to provide you with the care you need, when you need it.   You may see any of the following providers on your designated Care Team at your next follow up: Dr Glori Bickers Dr Haynes Kerns, NP Lyda Jester, [place] Audry Riles, PharmD   Please be sure to bring in all your medications bottles to every appointment.

## 2022-01-27 NOTE — Telephone Encounter (Signed)
Falecia with Dr. [facility] Gl[facility]en [facility] states [name] would like for the patient to schedule a follow up with Dr. Quentin Ore to discuss a possible ablation/possible modulator. She states this was not discussed previously.

## 2022-02-10 ENCOUNTER — Ambulatory Visit (INDEPENDENT_AMBULATORY_CARE_PROVIDER_SITE_OTHER): Payer: Medicaid Other

## 2022-02-10 DIAGNOSIS — I255 Ischemic cardiomyopathy: Secondary | ICD-10-CM | POA: Diagnosis not present

## 2022-02-10 LAB — CUP PACEART REMOTE DEVICE CHECK
Battery Remaining Longevity: 156 mo
Battery Remaining Percentage: 100 %
Brady Statistic RA Percent Paced: 0 %
Brady Statistic RV Percent Paced: 0 %
Date Time Interrogation Session: 20231212051400
HighPow Impedance: 78 Ohm
Implantable Lead Connection Status: 753985
Implantable Lead Connection Status: 753985
Implantable Lead Implant Date: 20221213
Implantable Lead Implant Date: 20221213
Implantable Lead Location: 753859
Implantable Lead Location: 753860
Implantable Lead Model: 673
Implantable Lead Model: 7841
Implantable Lead Serial Number: 1211072
Implantable Lead Serial Number: 160080
Implantable Pulse Generator Implant Date: 20221213
Lead Channel Impedance Value: 447 Ohm
Lead Channel Impedance Value: 525 Ohm
Lead Channel Setting Pacing Amplitude: 2 V
Lead Channel Setting Pacing Amplitude: 2.5 V
Lead Channel Setting Pacing Pulse Width: 0.4 ms
Lead Channel Setting Sensing Sensitivity: 0.5 mV
Pulse Gen Serial Number: 617464
Zone Setting Status: 755011

## 2022-03-11 NOTE — Progress Notes (Signed)
Remote ICD transmission.   

## 2022-03-30 ENCOUNTER — Encounter: Payer: Self-pay | Admitting: Cardiology

## 2022-03-30 ENCOUNTER — Ambulatory Visit: Payer: Medicaid Other | Attending: Cardiology | Admitting: Cardiology

## 2022-03-30 VITALS — BP 100/58 | HR 68 | Ht 73.0 in | Wt 202.0 lb

## 2022-03-30 DIAGNOSIS — I5022 Chronic systolic (congestive) heart failure: Secondary | ICD-10-CM | POA: Diagnosis not present

## 2022-03-30 DIAGNOSIS — I4892 Unspecified atrial flutter: Secondary | ICD-10-CM | POA: Diagnosis not present

## 2022-03-30 DIAGNOSIS — I25119 Atherosclerotic heart disease of native coronary artery with unspecified angina pectoris: Secondary | ICD-10-CM | POA: Diagnosis not present

## 2022-03-30 NOTE — H&P (View-Only) (Signed)
Electrophysiology Office Follow up Visit Note:    Date:  03/30/2022   ID:  Wyatt Donovan, DOB April 07, 1985, MRN [id number]  PCP:  Leonie Douglas, MD  [facility] Cardiologist:  Loralie Champagne, MD  [facility] Electrophysiologist:  [name], MD    Interval History:    Wyatt Donovan is a 37 y.o. male who presents for a follow up visit.   I last saw the patient May 13, 2021.  He has a Paediatric nurse ICD implanted February 10, 2021.  We have previously discussed management options for his atrial fibrillation but wanted to wait until he is stabilized with his systolic heart failure.  He was previously on amiodarone and has done well but given his very young age, planned to discuss catheter ablation in the future as long as his coronary disease remained stable.  He saw [name] January 21, 2022.  At that appointment he reported several episodes of chest pain.  He is still prescribed 100 mg of amiodarone once daily.  He takes Eliquis 5 mg by mouth twice daily for stroke prophylaxis.  The patient saw [name] January 27, 2022.  He is referred to discuss options for his atrial fibrillation and the possibility of cardiac contractility modulation therapy.  Today, he reports that his energy level has been stable. However, he reports occasional perspiration at rest without any associated lightheadedness. He takes showers to cool down.  We discussed CCM at great length today. Defibrillator device did not reveal any A fib. He denies any chest pain, shortness of breath, or peripheral edema. No headaches, syncope, orthopnea, or PND.  Past Medical History:  Diagnosis Date   Atrial fibrillation ([place])    Atypical chest pain    CAD (coronary artery disease)    CHF (congestive heart failure) (HCC)    Dyslipidemia    Ex-smoker    History of depression    Hyperlipidemia    Insomnia    Ischemic cardiomyopathy    Ejection fraction 40-45%   NSTEMI (non-ST elevated  myocardial infarction) ([place]) 08/01/2007   Treated with a bare metal stent to the proximal LAD, pt states MI x3   Suicide attempt ([place]) 01/30/2001    Past Surgical History:  Procedure Laterality Date   ADENOIDECTOMY     CARDIOVERSION N/A 12/06/2020   Procedure: CARDIOVERSION;  Surgeon: Larey Dresser, MD;  Location: [place];  Service: Cardiovascular;  Laterality: N/A;   COLONOSCOPY     CORONARY ARTERY BYPASS GRAFT N/A 07/03/2020   Procedure: CORONARY ARTERY BYPASS GRAFTING (CABG) times four using left internal mammary artery, right arm radial artery and right leg saphenous vein;  Surgeon: Lajuana Matte, MD;  Location: [place];  Service: Open Heart Surgery;  Laterality: N/A;   CORONARY STENT INTERVENTION N/A 12/03/2020   Procedure: CORONARY STENT INTERVENTION;  Surgeon: Martinique, Peter M, MD;  Location: [place] [facility];  Service: Cardiovascular;  Laterality: N/A;   CORONARY STENT PLACEMENT     Bare metal stent to proximal LAD   ICD IMPLANT N/A 02/10/2021   Procedure: ICD IMPLANT;  Surgeon: [name], MD;  Location: [place] CV [facility];  Service: Cardiovascular;  Laterality: N/A;   LEFT HEART CATH AND CORONARY ANGIOGRAPHY N/A 07/01/2020   Procedure: LEFT HEART CATH AND CORONARY ANGIOGRAPHY;  Surgeon: Lorretta Harp, MD;  Location: [place] [facility];  Service: Cardiovascular;  Laterality: N/A;   LEFT HEART CATH AND CORS/GRAFTS ANGIOGRAPHY N/A 12/03/2020   Procedure: LEFT HEART CATH AND  CORS/GRAFTS ANGIOGRAPHY;  Surgeon: Martinique, Peter M, MD;  Location: [place] [facility];  Service: Cardiovascular;  Laterality: N/A;   RADIAL ARTERY HARVEST Right 07/03/2020   Procedure: RADIAL ARTERY HARVEST;  Surgeon: Lajuana Matte, MD;  Location: [place];  Service: Open Heart Surgery;  Laterality: Right;   TEE WITHOUT CARDIOVERSION N/A 07/03/2020   Procedure: TRANSESOPHAGEAL ECHOCARDIOGRAM (TEE);  Surgeon: Lajuana Matte, MD;  Location: [place];  Service: Open Heart Surgery;   Laterality: N/A;   TYMPANOSTOMY TUBE PLACEMENT     when I was a kid   UPPER GASTROINTESTINAL ENDOSCOPY      Current Medications: Current Meds  Medication Sig   amiodarone (PACERONE) 200 MG tablet Take 0.5 tablets (100 mg total) by mouth daily.   apixaban (ELIQUIS) 5 MG TABS tablet Take 1 tablet (5 mg total) by mouth 2 (two) times daily.   atorvastatin (LIPITOR) 80 MG tablet Take 1 tablet (80 mg total) by mouth daily.   carvedilol (COREG) 3.125 MG tablet Take 1 tablet (3.125 mg total) by mouth 2 (two) times daily with a meal.   dapagliflozin propanediol (FARXIGA) 10 MG TABS tablet Take 1 tablet (10 mg total) by mouth daily before breakfast.   hydrocortisone (ANUSOL-HC) 25 MG suppository Place 25 mg rectally as needed for hemorrhoids or anal itching.   hydrOXYzine (ATARAX) 25 MG tablet Take 25 mg by mouth at bedtime.   linaclotide (LINZESS) 145 MCG CAPS capsule Take 1 capsule (145 mcg total) by mouth daily before breakfast.   nitroGLYCERIN (NITROSTAT) 0.4 MG SL tablet Place 1 tablet (0.4 mg total) under the tongue every 5 (five) minutes x 3 doses as needed for chest pain.   pantoprazole (PROTONIX) 40 MG tablet Take 1 tablet (40 mg total) by mouth daily.   sacubitril-valsartan (ENTRESTO) 24-26 MG Take 1 tablet by mouth 2 (two) times daily.   sodium zirconium cyclosilicate (LOKELMA) 5 g packet Take 5 g by mouth daily.   spironolactone (ALDACTONE) 25 MG tablet Take 1 tablet (25 mg total) by mouth daily.     Allergies:   Codeine   Social History   Socioeconomic History   Marital status: Single    Spouse name: Not on file   Number of children: Not on file   Years of education: Not on file   Highest education level: High school graduate  Occupational History   Occupation: Not actively working    Comment: applied for disability x 2.   Tobacco Use   Smoking status: Former    Packs/day: 1.00    Years: 22.00    Total pack years: 22.00    Types: Cigarettes, Cigars    Quit date:  06/28/2020    Years since quitting: 1.7   Smokeless tobacco: Never   Tobacco comments:    cigarettes stopped in 2020, swisher sweets started in 2018-04-05/day  Vaping Use   Vaping Use: Never used  Substance and Sexual Activity   Alcohol use: Never   Drug use: Not Currently    Types: Marijuana   Sexual activity: Never  Other Topics Concern   Not on file  Social History Narrative   Single   Lives with his parents   Trying to get his GED   DeGent date all cultures   Social Determinants of Health   Financial Resource Strain: Medium Risk (12/04/2020)   Overall Financial Resource Strain (CARDIA)    Difficulty of Paying Living Expenses: Somewhat hard  Food Insecurity: No Food Insecurity (12/04/2020)   Hunger Vital  Sign    Worried About Charity fundraiser in the Last Year: Never true    [place] in the Last Year: Never true  Transportation Needs: No Transportation Needs (12/04/2020)   PRAPARE - Hydrologist (Medical): No    Lack of Transportation (Non-Medical): No  Physical Activity: Not on file  Stress: Not on file  Social Connections: Not on file     Family History: The patient's family history includes Colon cancer in his father, paternal aunt, and paternal grandfather. There is no history of Hypertension, Diabetes, Coronary artery disease, Esophageal cancer, Rectal cancer, or Stomach cancer.  ROS:   Please see the history of present illness.    (+)occasional perspiration at rest All other systems reviewed and are negative.  EKGs/Labs/Other Studies Reviewed:    The following studies were reviewed today:  January 27, 2022 EKG shows sinus rhythm.  Narrow QRS.  December 06, 2020 cardiac MRI shows LVEF 29%, no LV thrombus, significant scar burden  (03/30/22) In clinic device interrogation personally reviewed. Battery longevity 13 years Lead parameter stable No high-voltage therapies  Recent Labs: 01/27/2022: ALT 41; BUN 19; Creatinine,  Ser 1.29; Hemoglobin 16.4; Platelets 202; Potassium 4.8; Sodium 143; TSH 0.812  Recent Lipid Panel    Component Value Date/Time   CHOL 106 08/26/2021 1010   TRIG 76 08/26/2021 1010   HDL 31 (L) 08/26/2021 1010   CHOLHDL 3.4 08/26/2021 1010   VLDL 15 08/26/2021 1010   LDLCALC 60 08/26/2021 1010    Physical Exam:    VS:  BP (!) 100/58   Pulse 68   Ht 6' 1"$  (1.854 m)   Wt 202 lb (91.6 kg)   SpO2 97%   BMI 26.65 kg/m     Wt Readings from Last 3 Encounters:  03/30/22 202 lb (91.6 kg)  01/27/22 194 lb 9.6 oz (88.3 kg)  01/21/22 193 lb 3.2 oz (87.6 kg)     GEN:  Well nourished, well developed in no acute distress CARDIAC: RRR, no murmurs, rubs, gallops, Device pocket well healed. RESPIRATORY:  Clear to auscultation without rales, wheezing or rhonchi        ASSESSMENT:    1. Chronic systolic CHF (congestive heart failure) ([place])   2. Coronary artery disease involving native coronary artery of native heart with angina pectoris (HCC)   3. Paroxysmal atrial flutter (HCC)    PLAN:    In order of problems listed above:  #Chronic systolic heart failure NYHA class II-III.  On GDMT.  Follows with [name].  EF persistently reduced.  I discussed cardiac contractility modulation with the patient during today's clinic appointment.  We discussed the risks and recovery after the procedure.  We discussed the potential benefit and the mechanism of CCM therapy. He would like to proceed with scheduling.  Risks, benefits, alternatives to CCM implantation were discussed in detail with the patient today. The patient understands that the risks include but are not limited to bleeding, infection, pneumothorax, perforation, tamponade, vascular damage, renal failure, MI, stroke, death, and lead dislodgement and wishes to proceed.  We will therefore schedule device implantation at the next available time.  He will hold his Eliquis for 72 hours prior to the implant to minimize the risk of  bleeding.  #ICD in situ Device functioning appropriately.  Continue remote monitoring.  #Persistent atrial fibrillation On amiodarone 100 mg by mouth once daily.  0% A-fib burden on device check.  Stop amiodarone today.  If recurrence in the future, favor starting Tikosyn.  Continue Eliquis.    Medication Adjustments/Labs and Tests Ordered: Current medicines are reviewed at length with the patient today.  Concerns regarding medicines are outlined above.  No orders of the defined types were placed in this encounter.  No orders of the defined types were placed in this encounter.   I,Mitra Faeizi,acting as a Education administrator for [name], MD.,have documented all relevant documentation on the behalf of [name], MD,as directed by  [name], MD while in the presence of [name], MD.  I, [name], MD, have reviewed all documentation for this visit. The documentation on 03/30/22 for the exam, diagnosis, procedures, and orders are all accurate and complete.   Signed, Lars Mage, MD, [facility], [facility] 03/30/2022 3:41 PM    Electrophysiology [facility]

## 2022-03-30 NOTE — Progress Notes (Deleted)
Electrophysiology Office Follow up Visit Note:    Date:  03/30/2022   ID:  Wyatt Donovan, DOB 1985-05-15, MRN [id number]  PCP:  Wyatt Douglas, Donovan  [facility] Cardiologist:  Wyatt Champagne, Donovan  [facility] Electrophysiologist:  Wyatt Epley, Donovan    Interval History:    Wyatt Donovan is a 37 y.o. male who presents for a follow up visit.   I last saw the patient May 13, 2021.  He has a Paediatric nurse ICD implanted February 10, 2021.  We have previously discussed management options for his atrial fibrillation but wanted to wait until he is stabilized with his systolic heart failure.  He was previously on amiodarone and has done well but given his very young age, planned to discuss catheter ablation in the future as long as his coronary disease remained stable.  He saw Wyatt Donovan January 21, 2022.  At that appointment he reported several episodes of chest pain.  He is still prescribed 100 mg of amiodarone once daily.  He takes Eliquis 5 mg by mouth twice daily for stroke prophylaxis.  The patient saw Dr. Aundra Donovan January 27, 2022.  He is referred to discuss options for his atrial fibrillation and the possibility of cardiac contractility modulation therapy.      Past Medical History:  Diagnosis Date   Atrial fibrillation ([place])    Atypical chest pain    CAD (coronary artery disease)    CHF (congestive heart failure) (HCC)    Dyslipidemia    Ex-smoker    History of depression    Hyperlipidemia    Insomnia    Ischemic cardiomyopathy    Ejection fraction 40-45%   NSTEMI (non-ST elevated myocardial infarction) ([place]) 08/01/2007   Treated with a bare metal stent to the proximal LAD, pt states MI x3   Suicide attempt ([place]) 01/30/2001    Past Surgical History:  Procedure Laterality Date   ADENOIDECTOMY     CARDIOVERSION N/A 12/06/2020   Procedure: CARDIOVERSION;  Surgeon: Wyatt Dresser, Donovan;  Location: [place];  Service: Cardiovascular;   Laterality: N/A;   COLONOSCOPY     CORONARY ARTERY BYPASS GRAFT N/A 07/03/2020   Procedure: CORONARY ARTERY BYPASS GRAFTING (CABG) times four using left internal mammary artery, right arm radial artery and right leg saphenous vein;  Surgeon: Wyatt Matte, Donovan;  Location: [place];  Service: Open Heart Surgery;  Laterality: N/A;   CORONARY STENT INTERVENTION N/A 12/03/2020   Procedure: CORONARY STENT INTERVENTION;  Surgeon: Wyatt Donovan;  Location: [place] CV [facility];  Service: Cardiovascular;  Laterality: N/A;   CORONARY STENT PLACEMENT     Bare metal stent to proximal LAD   ICD IMPLANT N/A 02/10/2021   Procedure: ICD IMPLANT;  Surgeon: Wyatt Epley, Donovan;  Location: [place] CV [facility];  Service: Cardiovascular;  Laterality: N/A;   LEFT HEART CATH AND CORONARY ANGIOGRAPHY N/A 07/01/2020   Procedure: LEFT HEART CATH AND CORONARY ANGIOGRAPHY;  Surgeon: Wyatt Harp, Donovan;  Location: [place] [facility];  Service: Cardiovascular;  Laterality: N/A;   LEFT HEART CATH AND CORS/GRAFTS ANGIOGRAPHY N/A 12/03/2020   Procedure: LEFT HEART CATH AND CORS/GRAFTS ANGIOGRAPHY;  Surgeon: Wyatt Donovan;  Location: [place] [facility];  Service: Cardiovascular;  Laterality: N/A;   RADIAL ARTERY HARVEST Right 07/03/2020   Procedure: RADIAL ARTERY HARVEST;  Surgeon: Wyatt Matte, Donovan;  Location: [place];  Service: Open Heart Surgery;  Laterality: Right;   TEE WITHOUT CARDIOVERSION N/A  07/03/2020   Procedure: TRANSESOPHAGEAL ECHOCARDIOGRAM (TEE);  Surgeon: Wyatt Matte, Donovan;  Location: [place];  Service: Open Heart Surgery;  Laterality: N/A;   TYMPANOSTOMY TUBE PLACEMENT     when I was a kid   UPPER GASTROINTESTINAL ENDOSCOPY      Current Medications: No outpatient medications have been marked as taking for the 03/30/22 encounter (Appointment) with Wyatt Epley, Donovan.     Allergies:   Codeine   Social History   Socioeconomic History   Marital status: Single    Spouse  name: Not on file   Number of children: Not on file   Years of education: Not on file   Highest education level: High school graduate  Occupational History   Occupation: Not actively working    Comment: applied for disability x 2.   Tobacco Use   Smoking status: Former    Packs/day: 1.00    Years: 22.00    Total pack years: 22.00    Types: Cigarettes, Cigars    Quit date: 06/28/2020    Years since quitting: 1.7   Smokeless tobacco: Never   Tobacco comments:    cigarettes stopped in 2020, swisher sweets started in 2018-04-05/day  Vaping Use   Vaping Use: Never used  Substance and Sexual Activity   Alcohol use: Never   Drug use: Not Currently    Types: Marijuana   Sexual activity: Never  Other Topics Concern   Not on file  Social History Narrative   Single   Lives with his parents   Trying to get his GED   DeGent date all cultures   Social Determinants of Health   Financial Resource Strain: Medium Risk (12/04/2020)   Overall Financial Resource Strain (CARDIA)    Difficulty of Paying Living Expenses: Somewhat hard  Food Insecurity: No Food Insecurity (12/04/2020)   Hunger Vital Sign    Worried About Running Out of Food in the Last Year: Never true    Ran Out of Food in the Last Year: Never true  Transportation Needs: No Transportation Needs (12/04/2020)   PRAPARE - Hydrologist (Medical): No    Lack of Transportation (Non-Medical): No  Physical Activity: Not on file  Stress: Not on file  Social Connections: Not on file     Family History: The patient's family history includes Colon cancer in his father, paternal aunt, and paternal grandfather. There is no history of Hypertension, Diabetes, Coronary artery disease, Esophageal cancer, Rectal cancer, or Stomach cancer.  ROS:   Please see the history of present illness.    All other systems reviewed and are negative.  EKGs/Labs/Other Studies Reviewed:    The following studies were reviewed  today:  January 27, 2022 EKG shows sinus rhythm.  Narrow QRS.  December 06, 2020 cardiac MRI shows LVEF 29%, no LV thrombus, significant scar burden  Recent Labs: 01/27/2022: ALT 41; BUN 19; Creatinine, Ser 1.29; Hemoglobin 16.4; Platelets 202; Potassium 4.8; Sodium 143; TSH 0.812  Recent Lipid Panel    Component Value Date/Time   CHOL 106 08/26/2021 1010   TRIG 76 08/26/2021 1010   HDL 31 (L) 08/26/2021 1010   CHOLHDL 3.4 08/26/2021 1010   VLDL 15 08/26/2021 1010   LDLCALC 60 08/26/2021 1010    Physical Exam:    VS:  There were no vitals taken for this visit.    Wt Readings from Last 3 Encounters:  01/27/22 194 lb 9.6 oz (88.3 kg)  01/21/22 193 lb 3.2  oz (87.6 kg)  11/24/21 194 lb (88 kg)     GEN: *** Well nourished, well developed in no acute distress CARDIAC: ***RRR, no murmurs, rubs, gallops RESPIRATORY:  Clear to auscultation without rales, wheezing or rhonchi        ASSESSMENT:    No diagnosis found. PLAN:    In order of problems listed above:  #Chronic systolic heart failure NYHA class II-III.  On GDMT.  Follows with Dr. Aundra Donovan.  EF persistently reduced.  I discussed cardiac contractility modulation with the patient during today's clinic appointment.  We discussed the risks and recovery after the procedure.  We discussed the potential benefit and the mechanism of CCM therapy.     #ICD in situ Device functioning appropriately.  Continue remote monitoring.  #Persistent atrial fibrillation On amiodarone 100 mg by mouth once daily.  0% A-fib burden on device check.  Stop amiodarone today.  If recurrence in the future, favor starting Tikosyn.  I do not think he is a good candidate for catheter ablation given the degree of his coronary artery disease.  I think his significant coronary disease provides a prohibitive periprocedural risk of morbidity/mortality. Continue Eliquis.      Medication Adjustments/Labs and Tests Ordered: Current medicines are  reviewed at length with the patient today.  Concerns regarding medicines are outlined above.  No orders of the defined types were placed in this encounter.  No orders of the defined types were placed in this encounter.    Signed, Lars Mage, Donovan, [facility], [facility] 03/30/2022 5:18 AM    Electrophysiology [facility]

## 2022-03-30 NOTE — Patient Instructions (Signed)
Medication Instructions:  Your physician has recommended you make the following change in your medication:  1) STOP taking amiodarone *If you need a refill on your cardiac medications before your next appointment, please call your pharmacy*  Lab Work: TODAY: BMET and CBC If you have labs (blood work) drawn today and your tests are completely normal, you will receive your results only by: [place] (if you have MyChart) OR A paper copy in the mail If you have any lab test that is abnormal or we need to change your treatment, we will call you to review the results.   Testing/Procedures: You will have a Cardiac Contractility Modulation implanted. Please see instruction letter for further information.  Follow-Up: At [facility], you and your health needs are our priority.  As part of our continuing mission to provide you with exceptional heart care, we have created designated Provider Care Teams.  These Care Teams include your primary Cardiologist (physician) and Advanced Practice Providers (APPs -  Physician Assistants and Nurse Practitioners) who all work together to provide you with the care you need, when you need it.  Your next appointment:   We will call you to arrange follow up

## 2022-03-30 NOTE — Progress Notes (Signed)
Electrophysiology Office Follow up Visit Note:    Date:  03/30/2022   ID:  Wyatt Donovan, DOB 04/10/1985, MRN [id number]  PCP:  Browning, Douglas, MD  [facility] Cardiologist:  Dalton McLean, MD  [facility] Electrophysiologist:  Marzell Isakson T Solei Wubben, MD    Interval History:    Wyatt Donovan is a 37 y.o. male who presents for a follow up visit.   I last saw the patient May 13, 2021.  He has a [employer] dual-chamber ICD implanted February 10, 2021.  We have previously discussed management options for his atrial fibrillation but wanted to wait until he is stabilized with his systolic heart failure.  He was previously on amiodarone and has done well but given his very young age, planned to discuss catheter ablation in the future as long as his coronary disease remained stable.  He saw Andy January 21, 2022.  At that appointment he reported several episodes of chest pain.  He is still prescribed 100 mg of amiodarone once daily.  He takes Eliquis 5 mg by mouth twice daily for stroke prophylaxis.  The patient saw Dr. McLean January 27, 2022.  He is referred to discuss options for his atrial fibrillation and the possibility of cardiac contractility modulation therapy.  Today, he reports that his energy level has been stable. However, he reports occasional perspiration at rest without any associated lightheadedness. He takes showers to cool down.  We discussed CCM at great length today. Defibrillator device did not reveal any A fib. He denies any chest pain, shortness of breath, or peripheral edema. No headaches, syncope, orthopnea, or PND.  Past Medical History:  Diagnosis Date   Atrial fibrillation (HCC)    Atypical chest pain    CAD (coronary artery disease)    CHF (congestive heart failure) (HCC)    Dyslipidemia    Ex-smoker    History of depression    Hyperlipidemia    Insomnia    Ischemic cardiomyopathy    Ejection fraction 40-45%   NSTEMI (non-ST elevated  myocardial infarction) (HCC) 08/01/2007   Treated with a bare metal stent to the proximal LAD, pt states MI x3   Suicide attempt (HCC) 01/30/2001    Past Surgical History:  Procedure Laterality Date   ADENOIDECTOMY     CARDIOVERSION N/A 12/06/2020   Procedure: CARDIOVERSION;  Surgeon: McLean, Dalton S, MD;  [facility]: [facility];  Service: Cardiovascular;  Laterality: N/A;   COLONOSCOPY     CORONARY ARTERY BYPASS GRAFT N/A 07/03/2020   Procedure: CORONARY ARTERY BYPASS GRAFTING (CABG) times four using left internal mammary artery, right arm radial artery and right leg saphenous vein;  Surgeon: Lightfoot, Harrell O, MD;  Location: [facility];  Service: Open Heart Surgery;  Laterality: N/A;   CORONARY STENT INTERVENTION N/A 12/03/2020   Procedure: CORONARY STENT INTERVENTION;  Surgeon: Jordan, Peter M, MD;  Location: [facility];  Service: Cardiovascular;  Laterality: N/A;   CORONARY STENT PLACEMENT     Bare metal stent to proximal LAD   ICD IMPLANT N/A 02/10/2021   Procedure: ICD IMPLANT;  Surgeon: Biff Rutigliano T, MD;  [facility]: [facility];  Service: Cardiovascular;  Laterality: N/A;   LEFT HEART CATH AND CORONARY ANGIOGRAPHY N/A 07/01/2020   Procedure: LEFT HEART CATH AND CORONARY ANGIOGRAPHY;  Surgeon: Berry, Jonathan J, MD;  [facility];  Service: Cardiovascular;  Laterality: N/A;   LEFT HEART CATH AND CORS/GRAFTS ANGIOGRAPHY N/A 12/03/2020   Procedure: LEFT HEART CATH AND   CORS/GRAFTS ANGIOGRAPHY;  Surgeon: Jordan, Peter M, MD;  [facility];  Service: Cardiovascular;  Laterality: N/A;   RADIAL ARTERY HARVEST Right 07/03/2020   Procedure: RADIAL ARTERY HARVEST;  Surgeon: Lightfoot, Harrell O, MD;  Location: [facility];  Service: Open Heart Surgery;  Laterality: Right;   TEE WITHOUT CARDIOVERSION N/A 07/03/2020   Procedure: TRANSESOPHAGEAL ECHOCARDIOGRAM (TEE);  Surgeon: Lightfoot, Harrell O, MD;  Location: [facility];  Service: Open Heart Surgery;   Laterality: N/A;   TYMPANOSTOMY TUBE PLACEMENT     when I was a kid   UPPER GASTROINTESTINAL ENDOSCOPY      Current Medications: Current Meds  Medication Sig   amiodarone (PACERONE) 200 MG tablet Take 0.5 tablets (100 mg total) by mouth daily.   apixaban (ELIQUIS) 5 MG TABS tablet Take 1 tablet (5 mg total) by mouth 2 (two) times daily.   atorvastatin (LIPITOR) 80 MG tablet Take 1 tablet (80 mg total) by mouth daily.   carvedilol (COREG) 3.125 MG tablet Take 1 tablet (3.125 mg total) by mouth 2 (two) times daily with a meal.   dapagliflozin propanediol (FARXIGA) 10 MG TABS tablet Take 1 tablet (10 mg total) by mouth daily before breakfast.   hydrocortisone (ANUSOL-HC) 25 MG suppository Place 25 mg rectally as needed for hemorrhoids or anal itching.   hydrOXYzine (ATARAX) 25 MG tablet Take 25 mg by mouth at bedtime.   linaclotide (LINZESS) 145 MCG CAPS capsule Take 1 capsule (145 mcg total) by mouth daily before breakfast.   nitroGLYCERIN (NITROSTAT) 0.4 MG SL tablet Place 1 tablet (0.4 mg total) under the tongue every 5 (five) minutes x 3 doses as needed for chest pain.   pantoprazole (PROTONIX) 40 MG tablet Take 1 tablet (40 mg total) by mouth daily.   sacubitril-valsartan (ENTRESTO) 24-26 MG Take 1 tablet by mouth 2 (two) times daily.   sodium zirconium cyclosilicate (LOKELMA) 5 g packet Take 5 g by mouth daily.   spironolactone (ALDACTONE) 25 MG tablet Take 1 tablet (25 mg total) by mouth daily.     Allergies:   Codeine   Social History   Socioeconomic History   Marital status: Single    Spouse name: Not on file   Number of children: Not on file   Years of education: Not on file   Highest education level: High school graduate  Occupational History   Occupation: Not actively working    Comment: applied for disability x 2.   Tobacco Use   Smoking status: Former    Packs/day: 1.00    Years: 22.00    Total pack years: 22.00    Types: Cigarettes, Cigars    Quit date:  06/28/2020    Years since quitting: 1.7   Smokeless tobacco: Never   Tobacco comments:    cigarettes stopped in 2020, [employer] started in 2018-04-05/day  Vaping Use   Vaping Use: Never used  Substance and Sexual Activity   Alcohol use: Never   Drug use: Not Currently    Types: Marijuana   Sexual activity: Never  Other Topics Concern   Not on file  Social History Narrative   Single   Lives with his parents   Trying to get his GED   DeGent date all cultures   Social Determinants of Health   Financial Resource Strain: Medium Risk (12/04/2020)   Overall Financial Resource Strain (CARDIA)    Difficulty of Paying Living Expenses: Somewhat hard  Food Insecurity: No Food Insecurity (12/04/2020)   Hunger Vital   Sign    Worried About Running Out of Food in the Last Year: Never true    Ran Out of Food in the Last Year: Never true  Transportation Needs: No Transportation Needs (12/04/2020)   PRAPARE - Transportation    Lack of Transportation (Medical): No    Lack of Transportation (Non-Medical): No  Physical Activity: Not on file  Stress: Not on file  Social Connections: Not on file     Family History: The patient's family history includes Colon cancer in his father, paternal aunt, and paternal grandfather. There is no history of Hypertension, Diabetes, Coronary artery disease, Esophageal cancer, Rectal cancer, or Stomach cancer.  ROS:   Please see the history of present illness.    (+)occasional perspiration at rest All other systems reviewed and are negative.  EKGs/Labs/Other Studies Reviewed:    The following studies were reviewed today:  January 27, 2022 EKG shows sinus rhythm.  Narrow QRS.  December 06, 2020 cardiac MRI shows LVEF 29%, no LV thrombus, significant scar burden  (03/30/22) In clinic device interrogation personally reviewed. Battery longevity 13 years Lead parameter stable No high-voltage therapies  Recent Labs: 01/27/2022: ALT 41; BUN 19; Creatinine,  Ser 1.29; Hemoglobin 16.4; Platelets 202; Potassium 4.8; Sodium 143; TSH 0.812  Recent Lipid Panel    Component Value Date/Time   CHOL 106 08/26/2021 1010   TRIG 76 08/26/2021 1010   HDL 31 (L) 08/26/2021 1010   CHOLHDL 3.4 08/26/2021 1010   VLDL 15 08/26/2021 1010   LDLCALC 60 08/26/2021 1010    Physical Exam:    VS:  BP (!) 100/58   Pulse 68   Ht 6' 1" (1.854 m)   Wt 202 lb (91.6 kg)   SpO2 97%   BMI 26.65 kg/m     Wt Readings from Last 3 Encounters:  03/30/22 202 lb (91.6 kg)  01/27/22 194 lb 9.6 oz (88.3 kg)  01/21/22 193 lb 3.2 oz (87.6 kg)     GEN:  Well nourished, well developed in no acute distress CARDIAC: RRR, no murmurs, rubs, gallops, Device pocket well healed. RESPIRATORY:  Clear to auscultation without rales, wheezing or rhonchi        ASSESSMENT:    1. Chronic systolic CHF (congestive heart failure) (HCC)   2. Coronary artery disease involving native coronary artery of native heart with angina pectoris (HCC)   3. Paroxysmal atrial flutter (HCC)    PLAN:    In order of problems listed above:  #Chronic systolic heart failure NYHA class II-III.  On GDMT.  Follows with Dr. McLean.  EF persistently reduced.  I discussed cardiac contractility modulation with the patient during today's clinic appointment.  We discussed the risks and recovery after the procedure.  We discussed the potential benefit and the mechanism of CCM therapy. He would like to proceed with scheduling.  Risks, benefits, alternatives to CCM implantation were discussed in detail with the patient today. The patient understands that the risks include but are not limited to bleeding, infection, pneumothorax, perforation, tamponade, vascular damage, renal failure, MI, stroke, death, and lead dislodgement and wishes to proceed.  We will therefore schedule device implantation at the next available time.  He will hold his Eliquis for 72 hours prior to the implant to minimize the risk of  bleeding.  #ICD in situ Device functioning appropriately.  Continue remote monitoring.  #Persistent atrial fibrillation On amiodarone 100 mg by mouth once daily.  0% A-fib burden on device check.  Stop amiodarone today.    If recurrence in the future, favor starting Tikosyn.  Continue Eliquis.    Medication Adjustments/Labs and Tests Ordered: Current medicines are reviewed at length with the patient today.  Concerns regarding medicines are outlined above.  No orders of the defined types were placed in this encounter.  No orders of the defined types were placed in this encounter.   I,Mitra Faeizi,acting as a scribe for Samiyyah Moffa T Finley Chevez, MD.,have documented all relevant documentation on the behalf of Sulayman Manning T Steele Stracener, MD,as directed by  Sonita Michiels T Joby Richart, MD while in the presence of Cherrish Vitali T Lailynn Southgate, MD.  I, Chenee Munns T Ryszard Socarras, MD, have reviewed all documentation for this visit. The documentation on 03/30/22 for the exam, diagnosis, procedures, and orders are all accurate and complete.   Signed, Yobani Schertzer, MD, FACC, FHRS 03/30/2022 3:41 PM    Electrophysiology [facility] 

## 2022-03-30 NOTE — Addendum Note (Signed)
Addended by: Bernestine Amass on: 03/30/2022 03:45 PM   Modules accepted: Orders

## 2022-03-31 ENCOUNTER — Telehealth: Payer: Self-pay | Admitting: Cardiology

## 2022-03-31 DIAGNOSIS — I5022 Chronic systolic (congestive) heart failure: Secondary | ICD-10-CM

## 2022-03-31 LAB — BASIC METABOLIC PANEL
BUN/Creatinine Ratio: 16 (ref 9–20)
BUN: 18 mg/dL (ref 6–20)
CO2: 18 mmol/L — ABNORMAL LOW (ref 20–29)
Calcium: 9.3 mg/dL (ref 8.7–10.2)
Chloride: 108 mmol/L — ABNORMAL HIGH (ref 96–106)
Creatinine, Ser: 1.16 mg/dL (ref 0.76–1.27)
Glucose: 56 mg/dL — ABNORMAL LOW (ref 70–99)
Potassium: 5.9 mmol/L (ref 3.5–5.2)
Sodium: 143 mmol/L (ref 134–144)
eGFR: 83 mL/min/{1.73_m2} (ref >59)

## 2022-03-31 LAB — CBC WITH DIFFERENTIAL/PLATELET
Basophils Absolute: 0.1 10*3/uL (ref 0.0–0.2)
Basos: 1 %
EOS (ABSOLUTE): 0.3 10*3/uL (ref 0.0–0.4)
Eos: 3 %
Hematocrit: 49 % (ref 37.5–51.0)
Hemoglobin: 16.3 g/dL (ref 13.0–17.7)
Immature Grans (Abs): 0 10*3/uL (ref 0.0–0.1)
Immature Granulocytes: 0 %
Lymphocytes Absolute: 2.4 10*3/uL (ref 0.7–3.1)
Lymphs: 29 %
MCH: 31.4 pg (ref 26.6–33.0)
MCHC: 33.3 g/dL (ref 31.5–35.7)
MCV: 94 fL (ref 79–97)
Monocytes Absolute: 0.6 10*3/uL (ref 0.1–0.9)
Monocytes: 7 %
Neutrophils Absolute: 4.9 10*3/uL (ref 1.4–7.0)
Neutrophils: 60 %
Platelets: 204 10*3/uL (ref 150–450)
RBC: 5.19 x10E6/uL (ref 4.14–5.80)
RDW: 12.8 % (ref 11.6–15.4)
WBC: 8.3 10*3/uL (ref 3.4–10.8)

## 2022-03-31 NOTE — Telephone Encounter (Signed)
Patient states he can come have his labs done tomorrow. Please advise

## 2022-03-31 NOTE — Telephone Encounter (Signed)
Spoke with the patient who states that he already took his Entresto this morning. Advised to hold evening dose. He is going to call his transportation to see when he can come back for repeat labs.

## 2022-03-31 NOTE — Telephone Encounter (Signed)
Patient scheduled for repeat labs tomorrow

## 2022-03-31 NOTE — Addendum Note (Signed)
Addended by: Bernestine Amass on: 03/31/2022 02:19 PM   Modules accepted: Orders

## 2022-03-31 NOTE — Telephone Encounter (Signed)
Received call regarding outpatient K+ of 5.9. Called and left message for the patient to hold his Entresto this morning and will need repeat labs. Will route to triage and ordering provider to follow up regarding labs and management.

## 2022-03-31 NOTE — Telephone Encounter (Signed)
Spoke with the patient and advised to stop spironolactone per Dr. Quentin Ore.

## 2022-04-01 ENCOUNTER — Ambulatory Visit: Payer: Medicaid Other | Attending: Cardiology

## 2022-04-01 DIAGNOSIS — I5022 Chronic systolic (congestive) heart failure: Secondary | ICD-10-CM

## 2022-04-02 LAB — BASIC METABOLIC PANEL
BUN/Creatinine Ratio: 14 (ref 9–20)
BUN: 14 mg/dL (ref 6–20)
CO2: 20 mmol/L (ref 20–29)
Calcium: 8.9 mg/dL (ref 8.7–10.2)
Chloride: 109 mmol/L — ABNORMAL HIGH (ref 96–106)
Creatinine, Ser: 1.03 mg/dL (ref 0.76–1.27)
Glucose: 61 mg/dL — ABNORMAL LOW (ref 70–99)
Potassium: 5 mmol/L (ref 3.5–5.2)
Sodium: 145 mmol/L — ABNORMAL HIGH (ref 134–144)
eGFR: 95 mL/min/{1.73_m2} (ref >59)

## 2022-04-16 NOTE — Pre-Procedure Instructions (Signed)
Attempted to call patient regarding procedure instructions for tomorrow.  Left voicemail on the following items: Arrival time 1:30 Nothing to eat or drink after midnight No meds AM of procedure Responsible person to drive you home and stay with you for 24 hrs Wash with special soap night before and morning of procedure If on anti-coagulant drug instructions Eliquis- last dose 2/13, if you didn't stop please let office know right away.

## 2022-04-17 ENCOUNTER — Encounter (HOSPITAL_COMMUNITY): Payer: Self-pay | Admitting: Cardiology

## 2022-04-17 ENCOUNTER — Other Ambulatory Visit: Payer: Self-pay

## 2022-04-17 ENCOUNTER — Encounter (HOSPITAL_COMMUNITY)
Admission: RE | Disposition: A | Discharge status: Home / Self Care | Payer: Self-pay | Source: Home / Self Care | Attending: Cardiology

## 2022-04-17 ENCOUNTER — Ambulatory Visit (HOSPITAL_COMMUNITY)
Admission: RE | Admit: 2022-04-17 | Discharge: 2022-04-18 | Disposition: A | Discharge status: Home / Self Care | Payer: Medicaid Other | Attending: Cardiology | Admitting: Cardiology

## 2022-04-17 DIAGNOSIS — Z79899 Other long term (current) drug therapy: Secondary | ICD-10-CM | POA: Diagnosis not present

## 2022-04-17 DIAGNOSIS — I420 Dilated cardiomyopathy: Secondary | ICD-10-CM | POA: Insufficient documentation

## 2022-04-17 DIAGNOSIS — I5022 Chronic systolic (congestive) heart failure: Secondary | ICD-10-CM | POA: Diagnosis present

## 2022-04-17 DIAGNOSIS — I4892 Unspecified atrial flutter: Secondary | ICD-10-CM | POA: Diagnosis not present

## 2022-04-17 DIAGNOSIS — I4819 Other persistent atrial fibrillation: Secondary | ICD-10-CM | POA: Diagnosis not present

## 2022-04-17 DIAGNOSIS — I25119 Atherosclerotic heart disease of native coronary artery with unspecified angina pectoris: Secondary | ICD-10-CM | POA: Insufficient documentation

## 2022-04-17 DIAGNOSIS — Z87891 Personal history of nicotine dependence: Secondary | ICD-10-CM | POA: Insufficient documentation

## 2022-04-17 DIAGNOSIS — Z7901 Long term (current) use of anticoagulants: Secondary | ICD-10-CM | POA: Insufficient documentation

## 2022-04-17 DIAGNOSIS — Z95810 Presence of automatic (implantable) cardiac defibrillator: Secondary | ICD-10-CM | POA: Diagnosis not present

## 2022-04-17 HISTORY — PX: CCM GENERATOR AND A/V LEAD INSERTION: EP1261

## 2022-04-17 SURGERY — CCM GENERATOR AND A/V LEAD INSERTION

## 2022-04-17 MED ORDER — SODIUM CHLORIDE 0.9 % IV SOLN: INTRAVENOUS | Status: DC

## 2022-04-17 MED ORDER — CEFAZOLIN SODIUM-DEXTROSE 2-4 GM/100ML-% IV SOLN
2.0000 g | INTRAVENOUS | Status: AC
  Administered 2022-04-17: 2 g via INTRAVENOUS

## 2022-04-17 MED ORDER — ATORVASTATIN CALCIUM 80 MG PO TABS
80.0000 mg | ORAL_TABLET | Freq: Every day | ORAL | Status: DC
  Administered 2022-04-18: 80 mg via ORAL
  Filled 2022-04-17: qty 1

## 2022-04-17 MED ORDER — SODIUM CHLORIDE 0.9 % IV SOLN
80.0000 mg | INTRAVENOUS | Status: AC
  Administered 2022-04-17: 80 mg

## 2022-04-17 MED ORDER — HYDROXYZINE HCL 50 MG PO TABS
50.0000 mg | ORAL_TABLET | Freq: Every day | ORAL | Status: DC
  Administered 2022-04-17: 50 mg via ORAL
  Filled 2022-04-17 (×3): qty 1

## 2022-04-17 MED ORDER — HEPARIN (PORCINE) IN NACL 1000-0.9 UT/500ML-% IV SOLN
INTRAVENOUS | Status: DC | PRN
  Administered 2022-04-17: 500 mL

## 2022-04-17 MED ORDER — CARVEDILOL 3.125 MG PO TABS
3.1250 mg | ORAL_TABLET | Freq: Two times a day (BID) | ORAL | Status: DC
  Administered 2022-04-18: 3.125 mg via ORAL
  Filled 2022-04-17: qty 1

## 2022-04-17 MED ORDER — ONDANSETRON HCL 4 MG/2ML IJ SOLN: 4.0000 mg | Freq: Four times a day (QID) | INTRAMUSCULAR | Status: DC | PRN

## 2022-04-17 MED ORDER — DAPAGLIFLOZIN PROPANEDIOL 10 MG PO TABS
10.0000 mg | ORAL_TABLET | Freq: Every day | ORAL | Status: DC
  Administered 2022-04-18: 10 mg via ORAL
  Filled 2022-04-17: qty 1

## 2022-04-17 MED ORDER — MIDAZOLAM HCL 5 MG/5ML IJ SOLN
INTRAMUSCULAR | Status: AC
  Filled 2022-04-17: qty 5

## 2022-04-17 MED ORDER — SPIRONOLACTONE 25 MG PO TABS
25.0000 mg | ORAL_TABLET | Freq: Every morning | ORAL | Status: DC
  Administered 2022-04-18: 25 mg via ORAL
  Filled 2022-04-17: qty 1

## 2022-04-17 MED ORDER — CEFAZOLIN SODIUM-DEXTROSE 2-4 GM/100ML-% IV SOLN
INTRAVENOUS | Status: AC
  Filled 2022-04-17: qty 100

## 2022-04-17 MED ORDER — LIDOCAINE HCL (PF) 1 % IJ SOLN
INTRAMUSCULAR | Status: DC | PRN
  Administered 2022-04-17: 60 mL

## 2022-04-17 MED ORDER — SACUBITRIL-VALSARTAN 24-26 MG PO TABS
1.0000 | ORAL_TABLET | Freq: Two times a day (BID) | ORAL | Status: DC
  Administered 2022-04-18: 1 via ORAL
  Filled 2022-04-17: qty 1

## 2022-04-17 MED ORDER — SODIUM CHLORIDE 0.9 % IV SOLN
INTRAVENOUS | Status: AC
  Filled 2022-04-17: qty 2

## 2022-04-17 MED ORDER — PANTOPRAZOLE SODIUM 40 MG PO TBEC
40.0000 mg | DELAYED_RELEASE_TABLET | Freq: Every day | ORAL | Status: DC
  Administered 2022-04-18: 40 mg via ORAL
  Filled 2022-04-17 (×2): qty 1

## 2022-04-17 MED ORDER — CEFAZOLIN SODIUM-DEXTROSE 1-4 GM/50ML-% IV SOLN
1.0000 g | Freq: Four times a day (QID) | INTRAVENOUS | Status: AC
  Administered 2022-04-17 – 2022-04-18 (×3): 1 g via INTRAVENOUS
  Filled 2022-04-17 (×4): qty 50

## 2022-04-17 MED ORDER — FENTANYL CITRATE (PF) 100 MCG/2ML IJ SOLN
INTRAMUSCULAR | Status: DC | PRN
  Administered 2022-04-17 (×2): 12.5 ug via INTRAVENOUS

## 2022-04-17 MED ORDER — ACETAMINOPHEN 325 MG PO TABS
325.0000 mg | ORAL_TABLET | ORAL | Status: DC | PRN
  Administered 2022-04-17: 650 mg via ORAL
  Filled 2022-04-17: qty 2

## 2022-04-17 MED ORDER — MIDAZOLAM HCL 5 MG/5ML IJ SOLN
INTRAMUSCULAR | Status: DC | PRN
  Administered 2022-04-17 (×2): .5 mg via INTRAVENOUS

## 2022-04-17 MED ORDER — LIDOCAINE HCL (PF) 1 % IJ SOLN
INTRAMUSCULAR | Status: AC
  Filled 2022-04-17: qty 60

## 2022-04-17 MED ORDER — FENTANYL CITRATE (PF) 100 MCG/2ML IJ SOLN
INTRAMUSCULAR | Status: AC
  Filled 2022-04-17: qty 2

## 2022-04-17 SURGICAL SUPPLY — 10 items
CABLE SURGICAL S-101-97-12 (CABLE) ×1 IMPLANT
GEN PULSE OPTIMIZER SMART MINI (Generator) ×1 IMPLANT
GENERATOR PLS OPTMZR SMRT MINI (Generator) IMPLANT
LEAD INGEVITY 7841 52 (Lead) IMPLANT
PAD DEFIB RADIO PHYSIO CONN (PAD) ×1 IMPLANT
POUCH AIGIS-R ANTIBACT PPM (Mesh General) ×1 IMPLANT
POUCH AIGIS-R ANTIBACT PPM MED (Mesh General) IMPLANT
SHEATH 7FR PRELUDE SNAP 13 (SHEATH) IMPLANT
SHEATH PROBE COVER 6X72 (BAG) IMPLANT
TRAY PACEMAKER INSERTION (PACKS) ×1 IMPLANT

## 2022-04-17 NOTE — Interval H&P Note (Signed)
History and Physical Interval Note:  04/17/2022 4:31 PM  Wyatt Donovan  has presented today for surgery, with the diagnosis of heart failure.  The various methods of treatment have been discussed with the patient and family. After consideration of risks, benefits and other options for treatment, the patient has consented to  Procedure(s): CCM GENERATOR AND A/V LEAD INSERTION (N/A) as a surgical intervention.  The patient's history has been reviewed, patient examined, no change in status, stable for surgery.  I have reviewed the patient's chart and labs.  Questions were answered to the patient's satisfaction.     Lynett Brasil T Dallis Czaja

## 2022-04-17 NOTE — Discharge Instructions (Signed)
After Your Cardiac Device   You have an [employer] CCM Device.   ACTIVITY Do not lift your arm above shoulder height for 1 week after your procedure. After 7 days, you may progress as below.  You should remove your sling 24 hours after your procedure, unless otherwise instructed by your provider.     Friday April 24, 2022  Saturday April 25, 2022 Sunday April 26, 2022 Monday April 27, 2022   Do not lift, push, pull, or carry anything over 10 pounds with the affected arm until 6 weeks (Friday May 29, 2022 ) after your procedure.   You may drive AFTER your wound check, unless you have been told otherwise by your provider.   Ask your healthcare provider when you can go back to work   INCISION/Dressing If you are on a blood thinner such as Coumadin, Xarelto, Eliquis, Plavix, or Pradaxa please confirm with your provider when this should be resumed.   If large square, outer bandage is left in place, this can be removed after 24 hours from your procedure. Do not remove steri-strips or glue as below.   Monitor your device site for redness, swelling, and drainage. Call the device clinic at [phone] if you experience these symptoms or fever/chills.  If your incision is sealed with Steri-strips or staples, you may shower 7 days after your procedure or when told by your provider. Do not remove the steri-strips or let the shower hit directly on your site. You may wash around your site with soap and water.    If you were discharged in a sling, please do not wear this during the day more than 48 hours after your surgery unless otherwise instructed. This may increase the risk of stiffness and soreness in your shoulder.   Avoid lotions, ointments, or perfumes over your incision until it is well-healed.  You may use a hot tub or a pool AFTER your wound check appointment if the incision is completely closed.  DEVICE MANAGEMENT  You should receive your ID card for your new  device in 4-8 weeks. Keep this card with you at all times once received. Consider wearing a medical alert bracelet or necklace.  Your device may be MRI compatible. This will be discussed at your next office visit/wound check.  You should avoid contact with strong electric or magnetic fields.   Do not use amateur (ham) radio equipment or electric (arc) welding torches. MP3 player headphones with magnets should not be used. Some devices are safe to use if held at least 12 inches (30 cm) from your Pacemaker. These include power tools, lawn mowers, and speakers. If you are unsure if something is safe to use, ask your health care provider.  When using your cell phone, hold it to the ear that is on the opposite side from the Device. Do not leave your cell phone in a pocket over the Device.   You may safely use electric blankets, heating pads, computers, and microwave ovens.  Call the office right away if: You have chest pain. You feel more short of breath than you have felt before. You feel more light-headed than you have felt before. Your incision starts to open up.  This information is not intended to replace advice given to you by your health care provider. Make sure you discuss any questions you have with your health care provider.

## 2022-04-17 NOTE — Plan of Care (Signed)

## 2022-04-18 ENCOUNTER — Ambulatory Visit (HOSPITAL_COMMUNITY): Payer: Medicaid Other

## 2022-04-18 DIAGNOSIS — I5022 Chronic systolic (congestive) heart failure: Secondary | ICD-10-CM | POA: Diagnosis not present

## 2022-04-18 MED ORDER — APIXABAN 5 MG PO TABS: ORAL_TABLET | ORAL | 3 refills | Status: DC

## 2022-04-18 NOTE — Discharge Summary (Signed)
Physician Discharge Summary      Patient ID: GUNAR TRIMMER MRN: [id number] DOB/AGE: 07-29-1985 37 y.o.  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 Today, he reports that discomfort is minimal and managed with non-narcotic medications. He received an understands instructions regarding the management and charging of his device. He understands that he will have to limit use of the right arm for several weeks as outlined in his discharge instructions. He was instructed to hold his eliquis for 5 days after the procedure.   Discharge Exam: Blood pressure 102/81, pulse (!) 56, temperature 98 F (36.7 C), temperature source Oral, resp. rate 18, height 6' 1"$  (1.854 m), weight 90.7 kg, SpO2 100 %.    Gen: Appears comfortable, well-nourished CV: RRR, no dependent edema The device site is normal -- no tenderness, edema, drainage, redness, threatened erosion.  Pulm: breathing easily, no rales   Labs:   Lab Results  Component Value Date   WBC 8.3 03/30/2022   HGB 16.3 03/30/2022   HCT 49.0 03/30/2022   MCV 94 03/30/2022   PLT 204 03/30/2022   No results for input(s): "NA", "K", "CL", "CO2", "BUN", "CREATININE", "CALCIUM", "PROT", "BILITOT", "ALKPHOS", "ALT", "AST", "GLUCOSE" in the last 168 hours.  Invalid input(s): "LABALBU" Lab Results  Component Value Date   CKTOTAL 281 12/05/2020   CKMB 35.6 (H) 08/31/2007   TROPONINI (HH) 08/31/2007    6.04        POSSIBLE MYOCARDIAL ISCHEMIA. SERIAL TESTING RECOMMENDED. CRITICAL RESULT CALLED TO, READ BACK BY AND VERIFIED WITH: OLCZAK,J RN [id number] [phone] JORDANS    Lab Results  Component Value Date   CHOL 106 08/26/2021   CHOL 94  12/04/2020   CHOL 74 08/08/2020   Lab Results  Component Value Date   HDL 31 (L) 08/26/2021   HDL 25 (L) 12/04/2020   HDL 19 (L) 08/08/2020   Lab Results  Component Value Date   LDLCALC 60 08/26/2021   LDLCALC 45 12/04/2020   LDLCALC 39 08/08/2020   Lab Results  Component Value Date   TRIG 76 08/26/2021   TRIG 118 12/04/2020   TRIG 78 08/08/2020   Lab Results  Component Value Date   CHOLHDL 3.4 08/26/2021   CHOLHDL 3.8 12/04/2020   CHOLHDL 3.9 08/08/2020   No results found for: "LDLDIRECT"    Radiology: CXR today shows device and leads in expected position. No pneumothorax. EKG: No new ECG available No significant telemetry events noted   FOLLOW UP PLANS AND APPOINTMENTS  Allergies as of 04/18/2022       Reactions   Codeine Itching        Medication List     TAKE these medications    apixaban 5 MG Tabs tablet Commonly known as: ELIQUIS Do not resume until Feb 22 What changed:  how much to take how to take this when to take this additional instructions   atorvastatin 80 MG tablet Commonly known as: LIPITOR Take 1 tablet (80 mg total) by mouth daily.   carvedilol 3.125 MG tablet Commonly known as: Coreg Take 1 tablet (3.125 mg total) by mouth 2 (two) times daily with a meal.   dapagliflozin propanediol 10 MG Tabs tablet Commonly known as: Farxiga Take 1 tablet (10 mg total) by mouth daily before breakfast.   Delene Loll  24-26 MG Generic drug: sacubitril-valsartan Take 1 tablet by mouth 2 (two) times daily.   hydrOXYzine 50 MG capsule Commonly known as: VISTARIL Take 50 mg by mouth at bedtime.   linaclotide 145 MCG Caps capsule Commonly known as: Linzess Take 1 capsule (145 mcg total) by mouth daily before breakfast. What changed:  when to take this reasons to take this   Lokelma 5 g packet Generic drug: sodium zirconium cyclosilicate Take 5 g by mouth daily. What changed: when to take this   nitroGLYCERIN 0.4 MG SL tablet Commonly  known as: NITROSTAT Place 1 tablet (0.4 mg total) under the tongue every 5 (five) minutes x 3 doses as needed for chest pain.   pantoprazole 40 MG tablet Commonly known as: PROTONIX Take 1 tablet (40 mg total) by mouth daily.   spironolactone 25 MG tablet Commonly known as: ALDACTONE Take 25 mg by mouth in the morning.         BRING ALL MEDICATIONS WITH YOU TO FOLLOW UP APPOINTMENTS  Greater than 30 minutes spent on discharge today. Signed: Melida Quitter, MD 04/18/2022, 7:35 AM

## 2022-04-18 NOTE — TOC Transition Note (Signed)
Transition of Care [facility]) - CM/SW Discharge Note   Patient Details  Name: Wyatt Donovan MRN: [id number] Date of Birth: 10-05-1985  Transition of [facility]) CM/SW Contact:  Tom-Johnson, Renea Ee, RN Phone Number: 04/18/2022, 12:25 PM   Clinical Narrative:     Patient is scheduled for discharge today. Patient underwent CCM Generator and A/V Lead Insertion yesterday 0000000 without complications, Cardiology following.   Outpatient f/u and instructions on AVS.  No TOC needs or recommendations noted. Family to transport at discharge. No further TOC needs noted.         Final next level of care: Home/Self Care Barriers to Discharge: Barriers Resolved   Patient Goals and CMS Choice   Choice offered to / list presented to : NA  Discharge Placement                  Patient to be transferred to facility by: Family      Discharge Plan and Services Additional resources added to the After Visit Summary for                  DME Arranged: N/A DME Agency: NA       HH Arranged: NA HH Agency: NA        Social Determinants of Health ([place]) Interventions SDOH Screenings   Food Insecurity: No Food Insecurity (12/04/2020)  Housing: Low Risk  (12/04/2020)  Transportation Needs: No Transportation Needs (12/04/2020)  Alcohol Screen: Low Risk  (07/02/2020)  Depression (PHQ2-9): Medium Risk (04/14/2021)  Financial Resource Strain: Medium Risk (12/04/2020)  Tobacco Use: Medium Risk (04/17/2022)     Readmission Risk Interventions    07/10/2020   12:01 PM  Readmission Risk Prevention Plan  Post Dischage Appt Complete  Medication Screening Complete  Transportation Screening Complete

## 2022-04-20 ENCOUNTER — Encounter (HOSPITAL_COMMUNITY): Payer: Self-pay | Admitting: Cardiology

## 2022-04-28 ENCOUNTER — Encounter (HOSPITAL_COMMUNITY): Payer: Medicaid Other | Admitting: Cardiology

## 2022-04-28 ENCOUNTER — Ambulatory Visit (HOSPITAL_COMMUNITY): Payer: Medicaid Other

## 2022-04-29 ENCOUNTER — Other Ambulatory Visit (HOSPITAL_COMMUNITY): Payer: Medicaid Other

## 2022-04-29 ENCOUNTER — Encounter (HOSPITAL_COMMUNITY): Payer: Medicaid Other | Admitting: Cardiology

## 2022-04-29 ENCOUNTER — Ambulatory Visit: Payer: Medicaid Other | Attending: Interventional Cardiology

## 2022-04-29 DIAGNOSIS — I5022 Chronic systolic (congestive) heart failure: Secondary | ICD-10-CM

## 2022-04-29 NOTE — Progress Notes (Signed)
Patient in today to follow up on CCM implant from 04/17/22.  Corene Cornea, industry rep present and interrogated CCM device.  See his scanned report to [employer] in [employer].  ICD interrogation not necessary today as adjustments made would have any impact on ICD device per industry.     Wound check performed.  Steri strips removed, wound edges well approximated, no signs of infection.  Wound care/shower and arm lift,push/pull restrictions given.  Patient educated on ongoing monitoring for s/s of infection at device site and device clinic number given if any concerns or questions.    Patient allowed to ask questions and all follow up appts given.  Patient will see Dr. Quentin Ore for 91 day follow up in early June.

## 2022-04-29 NOTE — Patient Instructions (Signed)
  Monitor your wound site for redness, swelling, and drainage. Call the device clinic at [phone] if you experience these symptoms or fever/chills.  Your incision was closed with Steri-strips or staples:  You may shower 7 days after your procedure and wash your incision with soap and water. Avoid lotions, ointments, or perfumes over your incision until it is well-healed.  You may use a hot tub or a pool after your wound check appointment if the incision is completely closed.  Do not lift, push or pull greater than 10 pounds with the affected arm until 6 weeks after your procedure.UNTIL AFTER MARCH 29TH   There are no other restrictions in arm movement after your wound check appointment.

## 2022-05-04 ENCOUNTER — Other Ambulatory Visit (HOSPITAL_COMMUNITY): Payer: Self-pay | Admitting: Cardiology

## 2022-05-12 ENCOUNTER — Ambulatory Visit (INDEPENDENT_AMBULATORY_CARE_PROVIDER_SITE_OTHER): Payer: Medicaid Other

## 2022-05-12 DIAGNOSIS — I255 Ischemic cardiomyopathy: Secondary | ICD-10-CM | POA: Diagnosis not present

## 2022-05-14 LAB — CUP PACEART REMOTE DEVICE CHECK
Battery Remaining Longevity: 156 mo
Battery Remaining Percentage: 100 %
Brady Statistic RA Percent Paced: 0 %
Brady Statistic RV Percent Paced: 1 %
Date Time Interrogation Session: 20240312051500
HighPow Impedance: 70 Ohm
Implantable Lead Connection Status: 753985
Implantable Lead Connection Status: 753985
Implantable Lead Implant Date: 20221213
Implantable Lead Implant Date: 20221213
Implantable Lead Location: 753859
Implantable Lead Location: 753860
Implantable Lead Model: 673
Implantable Lead Model: 7841
Implantable Lead Serial Number: 1211072
Implantable Lead Serial Number: 160080
Implantable Pulse Generator Implant Date: 20221213
Lead Channel Impedance Value: 456 Ohm
Lead Channel Impedance Value: 522 Ohm
Lead Channel Setting Pacing Amplitude: 2 V
Lead Channel Setting Pacing Amplitude: 2.5 V
Lead Channel Setting Pacing Pulse Width: 0.4 ms
Lead Channel Setting Sensing Sensitivity: 0.5 mV
Pulse Gen Serial Number: 617464
Zone Setting Status: 755011

## 2022-05-18 ENCOUNTER — Ambulatory Visit: Payer: Medicaid Other | Admitting: Internal Medicine

## 2022-06-19 NOTE — Progress Notes (Signed)
Remote ICD transmission.   

## 2022-07-01 ENCOUNTER — Encounter (HOSPITAL_COMMUNITY): Payer: Medicaid Other | Admitting: Cardiology

## 2022-07-01 ENCOUNTER — Ambulatory Visit (HOSPITAL_COMMUNITY): Admission: RE | Admit: 2022-07-01 | Payer: Medicaid Other | Source: Ambulatory Visit

## 2022-08-04 ENCOUNTER — Ambulatory Visit (INDEPENDENT_AMBULATORY_CARE_PROVIDER_SITE_OTHER): Payer: Medicaid Other | Admitting: Internal Medicine

## 2022-08-04 ENCOUNTER — Encounter: Payer: Self-pay | Admitting: Internal Medicine

## 2022-08-04 VITALS — BP 92/60 | HR 57 | Ht 73.0 in | Wt 206.8 lb

## 2022-08-04 DIAGNOSIS — K625 Hemorrhage of anus and rectum: Secondary | ICD-10-CM

## 2022-08-04 DIAGNOSIS — K5900 Constipation, unspecified: Secondary | ICD-10-CM

## 2022-08-04 DIAGNOSIS — K649 Unspecified hemorrhoids: Secondary | ICD-10-CM

## 2022-08-04 DIAGNOSIS — Z800 Family history of malignant neoplasm of digestive organs: Secondary | ICD-10-CM | POA: Diagnosis not present

## 2022-08-04 DIAGNOSIS — Z86010 Personal history of colonic polyps: Secondary | ICD-10-CM

## 2022-08-04 MED ORDER — LINACLOTIDE 290 MCG PO CAPS: 290.0000 ug | ORAL_CAPSULE | Freq: Every day | ORAL | 5 refills | Status: DC

## 2022-08-04 MED ORDER — HYDROCORTISONE ACETATE 25 MG RE SUPP: 25.0000 mg | Freq: Two times a day (BID) | RECTAL | 0 refills | Status: AC

## 2022-08-04 NOTE — Progress Notes (Signed)
Chief Complaint: Constipation, hemorrhoids  HPI : 37 year old male with history of CAD s/p CABG and PCI, HFrEF (35-40%), A-fib on Eliquis, depression presents for follow up of constipation and hemorrhoids  Interval History: The Linzess has been working but sometimes he does get bloated on occasion. On average he is having 5-7 BMs per week. He is currently on Eliquis therapy. He has still been seeing some rectal bleeding. The bleeding is not that substantial. Denies rectal pain or abdominal pain.  Wt Readings from Last 3 Encounters:  08/04/22 206 lb 12.8 oz (93.8 kg)  04/17/22 200 lb (90.7 kg)  03/30/22 202 lb (91.6 kg)   Current Outpatient Medications  Medication Sig Dispense Refill   apixaban (ELIQUIS) 5 MG TABS tablet Do not resume until Feb 22 180 tablet 3   atorvastatin (LIPITOR) 80 MG tablet Take 1 tablet (80 mg total) by mouth daily. 90 tablet 3   carvedilol (COREG) 3.125 MG tablet TAKE ONE TABLET BY MOUTH TWICE DAILY WITH MEALS 60 tablet 6   dapagliflozin propanediol (FARXIGA) 10 MG TABS tablet Take 1 tablet (10 mg total) by mouth daily before breakfast. 30 tablet 11   hydrOXYzine (VISTARIL) 50 MG capsule Take 50 mg by mouth at bedtime.     linaclotide (LINZESS) 145 MCG CAPS capsule Take 1 capsule (145 mcg total) by mouth daily before breakfast. (Patient taking differently: Take 145 mcg by mouth daily as needed (constipation.).) 90 capsule 1   nitroGLYCERIN (NITROSTAT) 0.4 MG SL tablet Place 1 tablet (0.4 mg total) under the tongue every 5 (five) minutes x 3 doses as needed for chest pain. 15 tablet 3   sacubitril-valsartan (ENTRESTO) 24-26 MG Take 1 tablet by mouth 2 (two) times daily. 180 tablet 3   sodium zirconium cyclosilicate (LOKELMA) 5 g packet Take 5 g by mouth daily. 30 packet 6   spironolactone (ALDACTONE) 25 MG tablet Take 25 mg by mouth in the morning.     pantoprazole (PROTONIX) 40 MG tablet Take 1 tablet (40 mg total) by mouth daily. 90 tablet 3   No current  facility-administered medications for this visit.   Physical Exam: BP 92/60   Pulse (!) 57   Ht 6\' 1"  (1.854 m)   Wt 206 lb 12.8 oz (93.8 kg)   SpO2 99%   BMI 27.28 kg/m  Constitutional: Pleasant,well-developed, male in no acute distress. HEENT: Normocephalic and atraumatic. Conjunctivae are normal. No scleral icterus. Cardiovascular: Normal rate, regular rhythm.  Pulmonary/chest: Effort normal and breath sounds normal. No wheezing, rales or rhonchi. Abdominal: Soft, nondistended, non-tender Extremities: No edema Neurological: Alert and oriented to person place and time. Skin: Skin is warm and dry. No rashes noted. Psychiatric: Normal mood and affect. Behavior is normal.  Labs 04/2021: CBC nml. TSH nml. CMP with mildly elevated ALT of 69  Labs 05/2021: BMP unremarkable  Labs 05/2021: Lipase, iron/TIBC, IgG, A1AT, AMA nml.   Labs 07/2021: CMP unremarkable  CT A/P w/contrast 07/01/21: IMPRESSION: Large stool burden and gaseous distention throughout the colon. No acute findings in the abdomen or pelvis.  EGD 07/30/21: - Normal esophagus. - Gastritis. Biopsied. - Normal examined duodenum. Path: 1. Surgical [P], gastric random FRAGMENTS OF GASTRIC MUCOSA WITH SUPERFICIAL MUCOSAL EROSIONS AND MINIMAL VASCULAR ECTASIA. H. PYLORI, INTESTINAL METAPLASIA, ATROPHY AND DYSPLASIA ARE NOT IDENTIFIED.  Colonoscopy 07/30/21: - The examined portion of the ileum was normal. - Two 3 to 4 mm polyps in the transverse colon and in the cecum, removed with a cold snare. Resected  and retrieved. - Localized inflammation was found in the sigmoid colon. - Four 3 to 10 mm polyps in the sigmoid colon, removed with a cold snare. Resected and retrieved. - Non-bleeding internal hemorrhoids. Path: 2. Surgical [P], right colon biopsies MILD NONSPECIFIC INFLAMMATION CONSISTENT WITH PREP RELATED CHANGES. ONE FRAGMENT HAS A PORTION OF A SUBMUCOSAL LIPOMA. THERE ARE NO DIAGNOSTIC FEATURES OF INFLAMMATORY BOWEL  DISEASE, MICROSCOPIC COLITIS AND COLLAGENOUS COLITIS. 3. Surgical [P], colon, cecum, transverse, polyp (2) INFLAMMATORY PSEUDOPOLYP AND A HYPERPLASTIC POLYP. NEGATIVE FOR DYSPLASIA. 4. Surgical [P], colon, transverse MILD NONSPECIFIC INFLAMMATION CONSISTENT WITH PREP RELATED CHANGES. THERE ARE NO DIAGNOSTIC FEATURES OF INFLAMMATORY BOWEL DISEASE, MICROSCOPIC COLITIS AND COLLAGENOUS COLITIS. 5. Surgical [P], left colon biopsies MILD NONSPECIFIC INFLAMMATION CONSISTENT WITH PREP RELATED CHANGES. THERE ARE NO DIAGNOSTIC FEATURES OF INFLAMMATORY BOWEL DISEASE, MICROSCOPIC COLITIS AND COLLAGENOUS COLITIS. 6. Surgical [P], colon, sigmoid x4, polyp (4) HYPERPLASTIC POLYPS WITH UNDERLYING HYPERPLASTIC LYMPHOID NODULES. NEGATIVE FOR DYSPLASIA.  ASSESSMENT AND PLAN: Constipation Rectal bleeding History of colon polyps Family history of colon cancer Hemorrhoids Patient is doing better on Linzess therapy but thinks that going to a higher dose may help further. Thus will increase his Linzess dosage from 145 mcg to 290 mcg QD. He has started having rectal bleeding again after being put on Eliquis therapy. Will have him try an Anusol suppository. If this is not effective, then can consider banding in the future. Will also see how he responds to the higher dose of Linzess. - Previously discussed drinking 8 cups of water per day, getting 30 minutes of physical activity per day, taking fiber supplement - Patient will get CBC checked with his cardiologist soon - Increase daily Linzess from 145 mcg QD to  290 mcg QD - Anusol HC supp BID for 7 days - Repeat colonoscopy due in 07/2026 due to family history of colon cancer - Will give him information about hemorrhoidal banding to consider in the future - RTC 6 months  Eulah Pont, MD  I spent 36 minutes of time, including in depth chart review, independent review of results as outlined above, communicating results with the patient directly, face-to-face  time with the patient, coordinating care, and ordering studies and medications as appropriate, and documentation.

## 2022-08-04 NOTE — Patient Instructions (Addendum)
We have sent the following medications to your pharmacy for you to pick up at your convenience: Linzess, Anusol suppository  Follow up in 6 months   If your blood pressure at your visit was 140/90 or greater, please contact your primary care physician to follow up on this.  _______________________________________________________  If you are age 39 or older, your body mass index should be between 23-30. Your Body mass index is 27.28 kg/m. If this is out of the aforementioned range listed, please consider follow up with your Primary Care Provider.  If you are age 57 or younger, your body mass index should be between 19-25. Your Body mass index is 27.28 kg/m. If this is out of the aformentioned range listed, please consider follow up with your Primary Care Provider.   ________________________________________________________  The [facility] providers would like to encourage you to use [facility] to communicate with providers for non-urgent requests or questions.  Due to long hold times on the telephone, sending your provider a message by [facility] may be a faster and more efficient way to get a response.  Please allow 48 business hours for a response.  Please remember that this is for non-urgent requests.  _______________________________________________________    Nonsurgical Procedures for Hemorrhoids Nonsurgical procedures can be used to treat hemorrhoids. Hemorrhoids are swollen veins in and around the rectum or the opening of the butt (anus). There are two types: Internal. These occur in the veins just inside the rectum. They may poke through to the outside. External. These occur in the veins outside the anus. They can be felt as a swelling or hard lump near the anus. Hemorrhoids can cause bleeding and pain. They can often be treated at home. If home treatments do not help, you may need to have a procedure done. Nonsurgical procedures include rubber band ligation, sclerotherapy, and infrared  coagulation. Tell a health care provider about: Any allergies you have. All medicines you are taking, including vitamins, herbs, eye drops, creams, and over-the-counter medicines. Any problems you or family members have had with anesthesia. Any bleeding problems you have. Any surgeries you have had. Any medical conditions you have. Whether you are pregnant or may be pregnant. What are the risks? Your health care provider will talk with you about risks. These may include: Infection. Bleeding. Pain. What happens before the procedure? Medicines Ask your provider about: Changing or stopping your regular medicines. These include any diabetes medicines or blood thinners you take. Taking medicines such as aspirin and ibuprofen. These medicines can thin your blood. Do not take them unless your provider tells you to. Taking over-the-counter medicines, vitamins, herbs, and supplements. General instructions Follow instructions from your provider about what you may eat and drink. You may need to have a colonoscopy. This is when your provider looks inside your colon using a tube with a camera on the end. This may be done to make sure there are no other causes for your bleeding or pain. What happens during the procedure?  Your rectal area will be cleaned. A jelly may be put into your rectum. It may contain a medicine to numb the area and keep you from feeling pain (anesthesia). A short tube (anoscope) will be put into your rectum to look at the hemorrhoids. If you have rubber band ligation done: Tools will be used to put rubber bands around the base of the hemorrhoids. This will cut off their blood supply. The hemorrhoids will fall off after a few days. If you have sclerotherapy  done: Medicine will be injected into the hemorrhoids. This will cause the hemorrhoids to shrink and dry up. If you have infrared coagulation done: A type of light will be shined on the hemorrhoids. The light will make  energy (infrared radiation). This will cause the hemorrhoids to scar and then fall off. Each of these procedures may vary among providers and hospitals. What happens after the procedure? You will be watched to make sure that you have no bleeding. Return to your normal activities as told by your provider. Ask your provider what activities are safe for you. This information is not intended to replace advice given to you by your health care provider. Make sure you discuss any questions you have with your health care provider. Document Revised: 10/10/2021 Document Reviewed: 10/10/2021 Elsevier Patient Education  2024 [employer].  Thank you for entrusting me with your care and for choosing [facility], Dr. Eulah Pont

## 2022-08-06 NOTE — Progress Notes (Deleted)
  Electrophysiology Office Follow up Visit Note:    Date:  08/06/2022   ID:  Wyatt Donovan, DOB 1985-12-01, MRN [id number]  PCP:  Waldon Reining, MD  [facility] Cardiologist:  Marca Ancona, MD  [facility] Electrophysiologist:  Lanier Prude, MD    Interval History:    Wyatt Donovan is a 37 y.o. male who presents for a follow up visit.   He had a CCM implanted April 17, 2022. I last saw him in clinic on March 30, 2022 for his history of chronic systolic heart failure secondary to ischemic heart disease.      Past medical, surgical, social and family history were reviewed.  ROS:   Please see the history of present illness.    All other systems reviewed and are negative.  EKGs/Labs/Other Studies Reviewed:    The following studies were reviewed today:  August 07, 2022 in clinic device interrogation personally reviewed ***     Physical Exam:    VS:  There were no vitals taken for this visit.    Wt Readings from Last 3 Encounters:  08/04/22 206 lb 12.8 oz (93.8 kg)  04/17/22 200 lb (90.7 kg)  03/30/22 202 lb (91.6 kg)     GEN: *** Well nourished, well developed in no acute distress CARDIAC: ***RRR, no murmurs, rubs, gallops RESPIRATORY:  Clear to auscultation without rales, wheezing or rhonchi       ASSESSMENT:    1. Chronic systolic CHF (congestive heart failure) (HCC)   2. Ischemic cardiomyopathy   3. ICD (implantable cardioverter-defibrillator) in place    PLAN:    In order of problems listed above:  #Chronic systolic heart failure #Ischemic cardiomyopathy #ICD in situ #CCM in situ  NYHA class II-III.  Warm and dry on exam. ICD in CCM functioning appropriately.  Continue remote monitoring.        Signed, Steffanie Dunn, MD, [facility], [facility] 08/06/2022 7:37 AM    Electrophysiology [facility]

## 2022-08-07 ENCOUNTER — Ambulatory Visit: Payer: Medicaid Other | Admitting: Cardiology

## 2022-08-11 ENCOUNTER — Ambulatory Visit (INDEPENDENT_AMBULATORY_CARE_PROVIDER_SITE_OTHER): Payer: Medicaid Other

## 2022-08-11 DIAGNOSIS — I255 Ischemic cardiomyopathy: Secondary | ICD-10-CM | POA: Diagnosis not present

## 2022-08-11 LAB — CUP PACEART REMOTE DEVICE CHECK
Battery Remaining Longevity: 156 mo
Battery Remaining Percentage: 100 %
Brady Statistic RA Percent Paced: 0 %
Brady Statistic RV Percent Paced: 1 %
Date Time Interrogation Session: 20240611050000
HighPow Impedance: 78 Ohm
Implantable Lead Connection Status: 753985
Implantable Lead Connection Status: 753985
Implantable Lead Implant Date: 20221213
Implantable Lead Implant Date: 20221213
Implantable Lead Location: 753859
Implantable Lead Location: 753860
Implantable Lead Model: 673
Implantable Lead Model: 7841
Implantable Lead Serial Number: 1211072
Implantable Lead Serial Number: 160080
Implantable Pulse Generator Implant Date: 20221213
Lead Channel Impedance Value: 462 Ohm
Lead Channel Impedance Value: 523 Ohm
Lead Channel Setting Pacing Amplitude: 2 V
Lead Channel Setting Pacing Amplitude: 2.5 V
Lead Channel Setting Pacing Pulse Width: 0.4 ms
Lead Channel Setting Sensing Sensitivity: 0.5 mV
Pulse Gen Serial Number: 617464
Zone Setting Status: 755011

## 2022-08-19 ENCOUNTER — Other Ambulatory Visit (HOSPITAL_COMMUNITY): Payer: Self-pay

## 2022-08-19 MED ORDER — PANTOPRAZOLE SODIUM 40 MG PO TBEC: 40.0000 mg | DELAYED_RELEASE_TABLET | Freq: Every day | ORAL | 0 refills | Status: DC

## 2022-08-26 ENCOUNTER — Encounter (HOSPITAL_COMMUNITY): Payer: Medicaid Other | Admitting: Cardiology

## 2022-08-26 ENCOUNTER — Ambulatory Visit (HOSPITAL_COMMUNITY): Payer: Medicaid Other

## 2022-09-08 NOTE — Progress Notes (Signed)
Remote ICD transmission.   

## 2022-09-17 ENCOUNTER — Other Ambulatory Visit: Payer: Self-pay

## 2022-09-17 MED ORDER — DAPAGLIFLOZIN PROPANEDIOL 10 MG PO TABS: 10.0000 mg | ORAL_TABLET | Freq: Every day | ORAL | 11 refills | Status: DC

## 2022-09-18 ENCOUNTER — Encounter: Payer: Medicaid Other | Admitting: Student

## 2022-09-24 ENCOUNTER — Other Ambulatory Visit (HOSPITAL_COMMUNITY): Payer: Self-pay

## 2022-09-24 ENCOUNTER — Other Ambulatory Visit (HOSPITAL_COMMUNITY): Payer: Self-pay | Admitting: Cardiology

## 2022-09-24 MED ORDER — PANTOPRAZOLE SODIUM 40 MG PO TBEC: 40.0000 mg | DELAYED_RELEASE_TABLET | Freq: Every day | ORAL | 0 refills | Status: DC

## 2022-10-19 NOTE — Progress Notes (Unsigned)
  Electrophysiology Office Note:   ID:  Mclain, Bradway 01-Jun-1985, MRN [id number]  Primary Cardiologist: Marca Ancona, MD Electrophysiologist: Lanier Prude, MD  {Click to update primary MD,subspecialty MD or APP then REFRESH:1}    History of Present Illness:   HARTMAN STILLSON is a 37 y.o. male with h/o CAD s/p CABG, ICM, LV thrombus, h/o GI bleeding seen today for routine electrophysiology followup.   Pt underwent CCM implantation for CHF 04/2022.    Since last being seen in our clinic the patient reports doing ***.  he denies chest pain, palpitations, dyspnea, PND, orthopnea, nausea, vomiting, dizziness, syncope, edema, weight gain, or early satiety.   Review of systems complete and found to be negative unless listed in HPI.   EP Information / Studies Reviewed:    EKG is ordered today. Personal review as below.       ICD Interrogation-  reviewed in detail today,  See PACEART report.  Device History: Field seismologist ICD implanted 2022 for ICM/CHF   [employer] CCM 04/17/2022 for CHF      Physical Exam:   VS:  There were no vitals taken for this visit.   Wt Readings from Last 3 Encounters:  08/04/22 206 lb 12.8 oz (93.8 kg)  04/17/22 200 lb (90.7 kg)  03/30/22 202 lb (91.6 kg)     GEN: Well nourished, well developed in no acute distress NECK: No JVD; No carotid bruits CARDIAC: {EPRHYTHM:28826}, no murmurs, rubs, gallops RESPIRATORY:  Clear to auscultation without rales, wheezing or rhonchi  ABDOMEN: Soft, non-tender, non-distended EXTREMITIES:  No edema; No deformity   ASSESSMENT AND PLAN:    Chronic systolic dysfunction s/p Environmental manager dual chamber ICD  euvolemic today Stable on an appropriate medical regimen Normal ICD function See Pace Art report No changes today  Paroxysmal AF/AFL EKG today shows *** Continue eliquis for CHA2DS2VASc of at least 4.  CAD s/p CABG Has chronic h/o atypical CP  Disposition:   Follow up  with {EPPROVIDERS:28135} {EPFOLLOW UP:28173}   Signed, Graciella Freer, PA-C

## 2022-10-20 ENCOUNTER — Ambulatory Visit: Payer: Medicaid Other | Admitting: Student

## 2022-10-20 ENCOUNTER — Encounter: Payer: Self-pay | Admitting: Student

## 2022-10-20 VITALS — BP 100/66 | HR 80 | Ht 73.0 in | Wt 203.4 lb

## 2022-10-20 DIAGNOSIS — I25119 Atherosclerotic heart disease of native coronary artery with unspecified angina pectoris: Secondary | ICD-10-CM | POA: Diagnosis not present

## 2022-10-20 DIAGNOSIS — I480 Paroxysmal atrial fibrillation: Secondary | ICD-10-CM

## 2022-10-20 DIAGNOSIS — I255 Ischemic cardiomyopathy: Secondary | ICD-10-CM

## 2022-10-20 DIAGNOSIS — I5022 Chronic systolic (congestive) heart failure: Secondary | ICD-10-CM | POA: Diagnosis not present

## 2022-10-20 DIAGNOSIS — Z95810 Presence of automatic (implantable) cardiac defibrillator: Secondary | ICD-10-CM

## 2022-10-20 LAB — CUP PACEART INCLINIC DEVICE CHECK
Date Time Interrogation Session: 20240820091340
HighPow Impedance: 76 Ohm
Implantable Lead Connection Status: 753985
Implantable Lead Connection Status: 753985
Implantable Lead Implant Date: 20221213
Implantable Lead Implant Date: 20221213
Implantable Lead Location: 753859
Implantable Lead Location: 753860
Implantable Lead Model: 673
Implantable Lead Model: 7841
Implantable Lead Serial Number: 1211072
Implantable Lead Serial Number: 160080
Implantable Pulse Generator Implant Date: 20221213
Lead Channel Impedance Value: 507 Ohm
Lead Channel Impedance Value: 538 Ohm
Lead Channel Pacing Threshold Amplitude: 0.5 V
Lead Channel Pacing Threshold Amplitude: 0.5 V
Lead Channel Pacing Threshold Pulse Width: 0.4 ms
Lead Channel Pacing Threshold Pulse Width: 1 ms
Lead Channel Sensing Intrinsic Amplitude: 25 mV
Lead Channel Sensing Intrinsic Amplitude: 5.5 mV
Lead Channel Setting Pacing Amplitude: 2 V
Lead Channel Setting Pacing Amplitude: 2.5 V
Lead Channel Setting Pacing Pulse Width: 0.4 ms
Lead Channel Setting Sensing Sensitivity: 0.5 mV
Pulse Gen Serial Number: 617464
Zone Setting Status: 755011

## 2022-10-20 NOTE — Patient Instructions (Signed)
Medication Instructions:  Your physician recommends that you continue on your current medications as directed. Please refer to the Current Medication list given to you today.  *If you need a refill on your cardiac medications before your next appointment, please call your pharmacy*  Lab Work: BMET, CBC, BNP-TODAY If you have labs (blood work) drawn today and your tests are completely normal, you will receive your results only by: MyChart Message (if you have MyChart) OR A paper copy in the mail If you have any lab test that is abnormal or we need to change your treatment, we will call you to review the results.  Follow-Up: At [facility], you and your health needs are our priority.  As part of our continuing mission to provide you with exceptional heart care, we have created designated Provider Care Teams.  These Care Teams include your primary Cardiologist (physician) and Advanced Practice Providers (APPs -  Physician Assistants and Nurse Practitioners) who all work together to provide you with the care you need, when you need it.  We recommend signing up for the patient portal called "MyChart".  Sign up information is provided on this After Visit Summary.  MyChart is used to connect with patients for Virtual Visits (Telemedicine).  Patients are able to view lab/test results, encounter notes, upcoming appointments, etc.  Non-urgent messages can be sent to your provider as well.   To learn more about what you can do with MyChart, go to [email or web address].    Your next appointment:   6 month(s)  Provider:   Steffanie Dunn, MD

## 2022-10-21 LAB — BASIC METABOLIC PANEL
BUN/Creatinine Ratio: 14 (ref 9–20)
BUN: 13 mg/dL (ref 6–20)
CO2: 19 mmol/L — ABNORMAL LOW (ref 20–29)
Calcium: 9.4 mg/dL (ref 8.7–10.2)
Chloride: 110 mmol/L — ABNORMAL HIGH (ref 96–106)
Creatinine, Ser: 0.9 mg/dL (ref 0.76–1.27)
Glucose: 96 mg/dL (ref 70–99)
Potassium: 4.5 mmol/L (ref 3.5–5.2)
Sodium: 144 mmol/L (ref 134–144)
eGFR: 111 mL/min/{1.73_m2} (ref >59)

## 2022-10-21 LAB — PRO B NATRIURETIC PEPTIDE: NT-Pro BNP: 441 pg/mL — ABNORMAL HIGH (ref 0–86)

## 2022-10-21 LAB — CBC
Hematocrit: 47.9 % (ref 37.5–51.0)
Hemoglobin: 15.7 g/dL (ref 13.0–17.7)
MCH: 30.5 pg (ref 26.6–33.0)
MCHC: 32.8 g/dL (ref 31.5–35.7)
MCV: 93 fL (ref 79–97)
Platelets: 216 10*3/uL (ref 150–450)
RBC: 5.14 x10E6/uL (ref 4.14–5.80)
RDW: 13.1 % (ref 11.6–15.4)
WBC: 9.4 10*3/uL (ref 3.4–10.8)

## 2022-10-23 ENCOUNTER — Other Ambulatory Visit: Payer: Self-pay

## 2022-10-23 ENCOUNTER — Emergency Department (HOSPITAL_COMMUNITY): Payer: Medicaid Other

## 2022-10-23 ENCOUNTER — Observation Stay (HOSPITAL_COMMUNITY)
Admission: EM | Admit: 2022-10-23 | Discharge: 2022-10-24 | Disposition: A | Discharge status: Home / Self Care | Payer: Medicaid Other | Source: Home / Self Care | Attending: Emergency Medicine | Admitting: Emergency Medicine

## 2022-10-23 ENCOUNTER — Encounter (HOSPITAL_COMMUNITY): Payer: Self-pay

## 2022-10-23 DIAGNOSIS — I480 Paroxysmal atrial fibrillation: Secondary | ICD-10-CM | POA: Diagnosis not present

## 2022-10-23 DIAGNOSIS — Z87891 Personal history of nicotine dependence: Secondary | ICD-10-CM | POA: Insufficient documentation

## 2022-10-23 DIAGNOSIS — R072 Precordial pain: Secondary | ICD-10-CM

## 2022-10-23 DIAGNOSIS — I25708 Atherosclerosis of coronary artery bypass graft(s), unspecified, with other forms of angina pectoris: Secondary | ICD-10-CM

## 2022-10-23 DIAGNOSIS — R0789 Other chest pain: Secondary | ICD-10-CM | POA: Diagnosis present

## 2022-10-23 DIAGNOSIS — I110 Hypertensive heart disease with heart failure: Secondary | ICD-10-CM | POA: Diagnosis not present

## 2022-10-23 DIAGNOSIS — I200 Unstable angina: Secondary | ICD-10-CM | POA: Diagnosis present

## 2022-10-23 DIAGNOSIS — K2970 Gastritis, unspecified, without bleeding: Secondary | ICD-10-CM

## 2022-10-23 DIAGNOSIS — Z7901 Long term (current) use of anticoagulants: Secondary | ICD-10-CM | POA: Insufficient documentation

## 2022-10-23 DIAGNOSIS — I5022 Chronic systolic (congestive) heart failure: Secondary | ICD-10-CM | POA: Diagnosis not present

## 2022-10-23 DIAGNOSIS — R61 Generalized hyperhidrosis: Secondary | ICD-10-CM | POA: Insufficient documentation

## 2022-10-23 DIAGNOSIS — Z79899 Other long term (current) drug therapy: Secondary | ICD-10-CM | POA: Insufficient documentation

## 2022-10-23 DIAGNOSIS — I5020 Unspecified systolic (congestive) heart failure: Secondary | ICD-10-CM | POA: Diagnosis not present

## 2022-10-23 DIAGNOSIS — I25110 Atherosclerotic heart disease of native coronary artery with unstable angina pectoris: Principal | ICD-10-CM | POA: Insufficient documentation

## 2022-10-23 DIAGNOSIS — Z95810 Presence of automatic (implantable) cardiac defibrillator: Secondary | ICD-10-CM | POA: Insufficient documentation

## 2022-10-23 DIAGNOSIS — R079 Chest pain, unspecified: Principal | ICD-10-CM

## 2022-10-23 LAB — CBC WITH DIFFERENTIAL/PLATELET
Abs Immature Granulocytes: 0.03 10*3/uL (ref 0.00–0.07)
Basophils Absolute: 0.1 10*3/uL (ref 0.0–0.1)
Basophils Relative: 1 %
Eosinophils Absolute: 0.2 10*3/uL (ref 0.0–0.5)
Eosinophils Relative: 3 %
HCT: 43.9 % (ref 39.0–52.0)
Hemoglobin: 14 g/dL (ref 13.0–17.0)
Immature Granulocytes: 0 %
Lymphocytes Relative: 29 %
Lymphs Abs: 2.2 10*3/uL (ref 0.7–4.0)
MCH: 30 pg (ref 26.0–34.0)
MCHC: 31.9 g/dL (ref 30.0–36.0)
MCV: 94 fL (ref 80.0–100.0)
Monocytes Absolute: 0.9 10*3/uL (ref 0.1–1.0)
Monocytes Relative: 12 %
Neutro Abs: 4.1 10*3/uL (ref 1.7–7.7)
Neutrophils Relative %: 55 %
Platelets: 187 10*3/uL (ref 150–400)
RBC: 4.67 MIL/uL (ref 4.22–5.81)
RDW: 13.6 % (ref 11.5–15.5)
WBC: 7.5 10*3/uL (ref 4.0–10.5)
nRBC: 0 % (ref 0.0–0.2)

## 2022-10-23 LAB — COMPREHENSIVE METABOLIC PANEL
ALT: 93 U/L — ABNORMAL HIGH (ref 0–44)
AST: 65 U/L — ABNORMAL HIGH (ref 15–41)
Albumin: 3 g/dL — ABNORMAL LOW (ref 3.5–5.0)
Alkaline Phosphatase: 68 U/L (ref 38–126)
Anion gap: 10 (ref 5–15)
BUN: 21 mg/dL — ABNORMAL HIGH (ref 6–20)
CO2: 21 mmol/L — ABNORMAL LOW (ref 22–32)
Calcium: 8.5 mg/dL — ABNORMAL LOW (ref 8.9–10.3)
Chloride: 110 mmol/L (ref 98–111)
Creatinine, Ser: 1.03 mg/dL (ref 0.61–1.24)
GFR, Estimated: >60 mL/min (ref >60)
Glucose, Bld: 89 mg/dL (ref 70–99)
Potassium: 4.5 mmol/L (ref 3.5–5.1)
Sodium: 141 mmol/L (ref 135–145)
Total Bilirubin: 0.8 mg/dL (ref 0.3–1.2)
Total Protein: 5.6 g/dL — ABNORMAL LOW (ref 6.5–8.1)

## 2022-10-23 LAB — BRAIN NATRIURETIC PEPTIDE: B Natriuretic Peptide: 70.9 pg/mL (ref 0.0–100.0)

## 2022-10-23 LAB — TROPONIN I (HIGH SENSITIVITY)
Troponin I (High Sensitivity): 22 ng/L — ABNORMAL HIGH (ref <18)
Troponin I (High Sensitivity): 24 ng/L — ABNORMAL HIGH (ref <18)
Troponin I (High Sensitivity): 39 ng/L — ABNORMAL HIGH (ref <18)
Troponin I (High Sensitivity): 37 ng/L — ABNORMAL HIGH (ref <18)

## 2022-10-23 LAB — LIPASE, BLOOD: Lipase: 29 U/L (ref 11–51)

## 2022-10-23 MED ORDER — MORPHINE SULFATE (PF) 4 MG/ML IV SOLN
4.0000 mg | Freq: Once | INTRAVENOUS | Status: AC
  Administered 2022-10-23: 4 mg via INTRAVENOUS
  Filled 2022-10-23: qty 1

## 2022-10-23 MED ORDER — ACETAMINOPHEN 325 MG PO TABS
650.0000 mg | ORAL_TABLET | Freq: Four times a day (QID) | ORAL | Status: DC
  Administered 2022-10-23 – 2022-10-24 (×3): 650 mg via ORAL
  Filled 2022-10-23 (×3): qty 2

## 2022-10-23 MED ORDER — ACETAMINOPHEN 325 MG PO TABS: 650.0000 mg | ORAL_TABLET | Freq: Four times a day (QID) | ORAL | Status: DC | PRN

## 2022-10-23 MED ORDER — FAMOTIDINE IN NACL 20-0.9 MG/50ML-% IV SOLN
20.0000 mg | Freq: Once | INTRAVENOUS | Status: AC
  Administered 2022-10-23: 20 mg via INTRAVENOUS
  Filled 2022-10-23: qty 50

## 2022-10-23 MED ORDER — POLYETHYLENE GLYCOL 3350 17 G PO PACK
17.0000 g | PACK | Freq: Every day | ORAL | Status: DC
  Filled 2022-10-23: qty 1

## 2022-10-23 MED ORDER — ATORVASTATIN CALCIUM 80 MG PO TABS
80.0000 mg | ORAL_TABLET | Freq: Every day | ORAL | Status: DC
  Administered 2022-10-23 – 2022-10-24 (×2): 80 mg via ORAL
  Filled 2022-10-23 (×2): qty 1

## 2022-10-23 MED ORDER — ALUM & MAG HYDROXIDE-SIMETH 200-200-20 MG/5ML PO SUSP
30.0000 mL | Freq: Once | ORAL | Status: AC
  Administered 2022-10-23: 30 mL via ORAL
  Filled 2022-10-23: qty 30

## 2022-10-23 MED ORDER — NITROGLYCERIN 0.4 MG SL SUBL: 0.4000 mg | SUBLINGUAL_TABLET | SUBLINGUAL | Status: DC | PRN

## 2022-10-23 MED ORDER — MORPHINE SULFATE (PF) 4 MG/ML IV SOLN: 4.0000 mg | Freq: Three times a day (TID) | INTRAVENOUS | Status: DC | PRN

## 2022-10-23 MED ORDER — LIDOCAINE 5 % EX PTCH
1.0000 | MEDICATED_PATCH | CUTANEOUS | Status: DC
  Administered 2022-10-23: 1 via TRANSDERMAL
  Filled 2022-10-23: qty 1

## 2022-10-23 MED ORDER — CARVEDILOL 3.125 MG PO TABS
3.1250 mg | ORAL_TABLET | Freq: Two times a day (BID) | ORAL | Status: DC
  Administered 2022-10-23: 3.125 mg via ORAL
  Filled 2022-10-23 (×2): qty 1

## 2022-10-23 MED ORDER — PANTOPRAZOLE SODIUM 40 MG IV SOLR
40.0000 mg | Freq: Once | INTRAVENOUS | Status: AC
  Administered 2022-10-23: 40 mg via INTRAVENOUS
  Filled 2022-10-23: qty 10

## 2022-10-23 MED ORDER — DAPAGLIFLOZIN PROPANEDIOL 10 MG PO TABS
10.0000 mg | ORAL_TABLET | Freq: Every day | ORAL | Status: DC
  Administered 2022-10-24: 10 mg via ORAL
  Filled 2022-10-23: qty 1

## 2022-10-23 MED ORDER — SACUBITRIL-VALSARTAN 24-26 MG PO TABS
1.0000 | ORAL_TABLET | Freq: Two times a day (BID) | ORAL | Status: DC
  Administered 2022-10-23 – 2022-10-24 (×2): 1 via ORAL
  Filled 2022-10-23 (×2): qty 1

## 2022-10-23 MED ORDER — ACETAMINOPHEN 650 MG RE SUPP: 650.0000 mg | Freq: Four times a day (QID) | RECTAL | Status: DC | PRN

## 2022-10-23 MED ORDER — SPIRONOLACTONE 25 MG PO TABS
25.0000 mg | ORAL_TABLET | Freq: Every day | ORAL | Status: DC
  Administered 2022-10-23 – 2022-10-24 (×2): 25 mg via ORAL
  Filled 2022-10-23 (×2): qty 1

## 2022-10-23 MED ORDER — PANTOPRAZOLE SODIUM 40 MG PO TBEC
40.0000 mg | DELAYED_RELEASE_TABLET | Freq: Every day | ORAL | Status: DC
  Administered 2022-10-23 – 2022-10-24 (×2): 40 mg via ORAL
  Filled 2022-10-23 (×2): qty 1

## 2022-10-23 MED ORDER — APIXABAN 5 MG PO TABS
5.0000 mg | ORAL_TABLET | Freq: Two times a day (BID) | ORAL | Status: DC
  Administered 2022-10-23 – 2022-10-24 (×2): 5 mg via ORAL
  Filled 2022-10-23 (×2): qty 1

## 2022-10-23 NOTE — H&P (Cosign Needed)
Date: 10/23/2022               Patient Name:  Wyatt Donovan MRN: [id number]  DOB: [birth year] Age / Sex: 37 y.o., male   PCP: Toma Deiters, MD              Medical Service: Internal Medicine Teaching Service              Attending Physician: Dr. Carlynn Purl    First Contact: Lang Snow, OMS 4 Pager: [phone]  Second Contact: Dr. Modena Slater Pager: [phone]            After Hours (After 5p/  First Contact Pager: [phone]  weekends / holidays): Second Contact Pager: [phone]   Chief Complaint: chest pain  History of Present Illness:  [name] is a 37 year old male with PMH significant for MI in 2012 at age 54, CAD s/p CABG in 2022, ICD placement in 2022, Afib/flutter, LV apical thrombus, ischemic cardiomyopathy, HFrEF (EF 35-40% 11/2020), history of GI bleed while on anticoagulation, HTN, and HLD who presented for chest pain. Chest pain started suddenly at 4am when he was sitting down watching tv. He took nitroglycerin when chest pain started and it did not relive his chest pain so he took another nitroglycerin 5 minutes later which also did not relieve his symptoms so he called EMS. He describes the pain as substernal pressure that is constant and does not radiate to the arm, neck, back, or abdomen. Nothing makes it worse. The only thing that relieves his chest pain is morphine that he is getting in the ED. Pain was 7/10 at home and now 6/10 with morphine. Chest pain is not reproducible on palpation, taking deep breathes, or positional changes. He reports this does not feel like previous chest pain he had with his MI. He endorses palpitations, diaphoresis, left sided headache, hot flashes and lightheadedness. Denied loss of consciousness. Drinks plenty of water for hydration. He denied nausea/vomiting, but did have an episode of vomiting two nights ago. Patient endorses feeling heartburn around his throat. He ran out of Protnonix two weeks ago and he had pizza for dinner last night.    In the ED, patient's troponins were flat at 22 and 24. BNP 70.9. EKG was normal. CXR showed low lung volumes without evidence of acute cardiopulmonary disease. Vitals are stable. Patient was seen by cardiology in the ED and cardio cannot identify an active cardiac cause for his complaints considering lack of response to NTG and prolonged symptoms without ECG/biomarker changes. Patient is admitted for cardiac monitoring and continued surveillance of troponins.   Meds:  Eliquis 5mg  BID Atorvastatin 80mg  once a day Coreg 3.125mg  BID Farxiga 10mg  once a day  Linzess as needed for constipation  Nitroglycerin 0.4mg  SL as needed for chest pain  Protonix 40mg  once a day  Entresto 24-26mg  BID Lokelma 5g MWF Spironolactone 25mg  once a day   Allergies: Allergies as of 10/23/2022 - Review Complete 10/23/2022  Allergen Reaction Noted   Codeine Itching 12/27/2007   Medical History  Afib CAD s/p CABG 2021 ICE placement 2021 Dyslipidemia GERD Intermittent hemorrhoids  Coronary stent 2022  Family History:  Father - bladder cancer, HTN Paternal Grandmotrher - DM Mother - CAD w stenting, PAD; deceased  Paternal grandfather - CAD Aunt - Colon cancer   Social History:  Denied alcohol use and substance use Quit smoking after CABG in 2022. Started smoking at age 32 and smoked 1 PPD (21 pack years)  Lives at home with dad and brother.  Not currently working.  Able to perform all ADLs. Able to walk and uses a cane sometimes to get around.  PCP: Dr Olena Leatherwood. Cardio Dr. Shirlee Latch and Dr. Lalla Brothers.   Review of Systems: A complete ROS was negative except as per HPI.   Physical Exam: Blood pressure 108/75, pulse 67, temperature 97.9 F (36.6 C), temperature source Oral, resp. rate 16, SpO2 92%.  General: NAD, alert and oriented, cooperative.  HEENT: NCAT, EOMI, vision grossly intact, normal conjunctiva, sclera anicteric, moist mucous membrane, severe teeth decay   Neck: Supple, trachea  midline, no JVD.  Lungs: normal respiratory effort on room air, CTAB. No crackle or wheeze. Good pulmonary excursion.  Chest: nontender to palpation.  Heart: RRR, no M/R/G. Radial pulses 2+ and symmetric. Cap refill <3 seconds.  Abdomen: +BS. Soft, NTND, no HSM, no palpable masses. Extremities: Moves all extremities. No LE edema, no cyanosis or clubbing.  Neuro: Alert and oriented. Nonfocal.  Skin: warm, dry, good turgor.  Psych: cooperative, appropriate mood and affect.   EKG: personally reviewed my interpretation is sinus rhythm with prolonged PR interval, possible 1st degree AV block. Borderline right bundle branch block.   CXR 10/23/22 6:44AM FINDINGS: Lung volumes are slightly low. No definite consolidative airspace disease or pleural effusions. No pneumothorax. No evidence of pulmonary edema. Heart size is normal. Upper mediastinal contours are within normal limits. Status post median sternotomy for CABG including LIMA. Bilateral pacemaker devices are noted with lead tips projecting over the expected location of the right atrium and right ventricle. IMPRESSION: 1. Low lung volumes without radiographic evidence of acute cardiopulmonary disease. 2. Postoperative changes and support apparatus, as above.  Assessment & Plan by Problem: Active Problems:   * No active hospital problems. * #Unstable angina Pt presented with substernal chest pain at rest not relieved by NGT without cardiac biomarkers. Not reporducible on palpation, deep inhalation, and positional changes. Troponins 22, 24. BNP 70.9. EKG without ischemic changes, CXR without evidence of cardiopulmonary disease. Recent ICD check was OK.  - Continue to trend troponins  - Continue cardiac monitoring - Possible nuclear perfusion scan after the weekend if patient still here. However, extensive areas of scar would limit ability to detect reversible ischemia - Continue morphine PRN and lidocaine patch on chest - Has outpatient  appointment with Cardio 9/26  #Lightheadedness/dizziness #Diaphoresis  - Orthostatic vitals - PT/OT consult   #CAD s/p CABG - Continue home Atorvastatin 80mg . Not on Aspirin given on Eliquis and hx of GI bleed on anticoagulation   #Chronic HFrEF Echo 11/2020 showed EF 35-40%. BNP today 70.9. No JVD or LE edema on exam.  - Continue home Coreg 3.125mg  BID, Farxiga 10mg  once a day, Entresto 24-26mg  BID, Spironolactone 25mg  once a day  #s/p ICD  Device check 10/20/22 showed normal device function and no ventricular arrhythmias.  Sees Dr. Lalla Brothers outpatient.   #paroxysmal Afib #LV apical thrombus cMRI 06/2020 noted thrombus and cMRI 11/2020 showed no LV thrombus. Sinus  - Continue home Eliquis 5mg  BID  #History of GI bleed #GERD - Continue home Protonix 40mg  daily   #HTN - Continue home Coreg 3.125mg  BID, Entresto 24-26mg  BID, Spironolactone 25mg  once a day  #HLD - Continue home Atorvastatin 80mg  once a day   Best Practice: Diet: heart healthy  IVF: none  VTE: Eliquis AB: none Therapy Recs: PT/OT  Family Contact: Father, Nadine Counts, updated at bedside  Dispo: Admit patient to Observation with expected length of stay less  than 2 midnights.  Signed: Lang Snow, Medical Student 10/23/2022, 3:54 PM  Pager: [phone]  Attestation for Student Documentation:  I personally was present and re-performed the history, physical exam and medical decision-making activities of this service and have verified that the service and findings are accurately documented in the student's note.  Modena Slater, DO 10/24/2022, 10:46 AM

## 2022-10-23 NOTE — ED Triage Notes (Signed)
Pt with hx of cardiac hx with triple bypass, and stents as well as a rechargeable pacemaker and ICD. Had chest pain and tightness. Pt has 10 mg morphine and 1 nitroglycerin tab via EMS.

## 2022-10-23 NOTE — Consult Note (Addendum)
Cardiology Consultation   Patient ID: Wyatt Donovan MRN: [id number]; DOB: Jun 06, 1985  Admit date: 10/23/2022 Date of Consult: 10/23/2022  PCP:  Toma Deiters, MD   [facility] Providers Cardiologist:  Marca Ancona, MD  Electrophysiologist:  Lanier Prude, MD    Patient Profile:   Wyatt Donovan is a 37 y.o. male with a hx of early onset CAD s/p CABG, ICM, LV thrombus, history of GI bleed, s/p ICD, atrial fib/flutter, HTN, HLD who is being seen 10/23/2022 for the evaluation of chest pain at the request of Dr. Theresia Lo.  History of Present Illness:   Wyatt Donovan is a 37 year old male with above medical history Dr. Shirlee Latch, Dr. Lalla Brothers.  Per chart review, patient had his first MI in 2012 when he was 37 years old.  He underwent PCI to the LAD at that time.  Later, patient underwent echocardiogram on 06/29/2020 that showed EF 40-45%, grade 2 diastolic dysfunction, regional wall motion abnormalities, normal RV function.  Patient underwent left heart catheterization on 07/01/2020 that showed three-vessel disease with an occluded LAD at the site of prior stenting an an occluded codominant RCA. Also had 95% stenosis in the mid AV groove circumflex. Decided to assess the viability of the anterior wall prior to intervention/CABG. Cardiac MRI on 07/01/20 showed that patient had an LV apical thrombus, severe LV dysfunction with LVEF 27%. He was started on anticoagulation. Ultimately underwent CABGx4 with LIMA-LAD, SVG-diagonal, Right radial artery-OM, SVG-PDA. Post-op, patient had GI bleeding and his anticoagulation was held. After his CABG, he stopped smoking. Follow up echocardiogram in 09/2020 showed EF 25-30%, with regional wall motion abnormalities, normal RV function, mild-moderate mitral valve regurgitation. Recommended that patient be referred to EP for ICD  Patient later had NSTEMI in 11/2020. Underwent LHC on 12/03/20 that showed patent LIMA-LAD with occlusion of all other grafts.  He underwent PCI/DES to the native Lcx that was 99% stenosed. The native LAD and RCA remained occluded. Cardiac MRI on 12/05/2020 showed no LV thrombus, severe LV systolic dysfunction (EF 29%), subendocardial late gadolinium enhancement consistent with prior infarct. While admitted, he developed atrial fibrillation and flutter with RVR. HE was treated with IV amiodarone and started on eliquis. Underwent DCCV with cardioversion to NSR on 12/06/20. Had an ICD implanted on 02/10/21.   Patient was seen by Dr. Lalla Brothers with EP on 03/30/22- at that appointment, they discussed cardiac contractility modulation. Patient was in agreement to proceed. It was noted that patient had a 0% afib burden and his amiodarone was stopped. If patient had afib recurrence in the future, EP would consider Tikosyn. Patient underwent CCM implantation for CHF in 04/2022. He was last seen by cardiology on 10/20/22. At that time, patient reported that he had been slightly more short of breath than normal for the past 2-3 months. Was able to walk about 5-10 minutes/day but was not very active otherwise. HE was euvolemic on exam and had normal ICD function. EKG showed normal sinus rhythm. Of note, patient has chronic history of atypical chest pain  Patient presented to the ED on 10/23/22 complaining of chest pain/tightness. Initial vital signs in the ED showed BP 126/88, HR 73 BPM, oxygen 98% on room air. Labs in the ED showed K 4.5, creatinine 1.03, WBC 7.5, hemoglobin 14.0, platelets 187. hsTn 22, 24. BNP 70.9.   CXR showed low lung volumes without radiographic evidence of acute cardiopulmonary disease.   On interview, patient reports that yesterday, he felt nauseous and went on to  develop chest pain. He vomiting and took a SL nitroglycerin and his pain resolved. This AM, he was in bed around 3 AM when he again started to have chest pain. He waited about 15 minutes to see if pain would resolve on its own but it persisted. He took 2 nitroglycerin  without relief and called EMS. He took a third nitroglycerin while waiting for EMS without improvement. Pain has been constant since this AM. Had some relief with morphine. Pain is worse with deep inhalation or with cough. Chest pain is reproducible to palpation. Pain is not affected by position or associated with exertion. He usually walks every day back and forth to his mailbox and denies recent chest pain on exertion. Denies further nausea, vomiting. Denies diarrhea, nasal congestion, sore throat, fever.   Past Medical History:  Diagnosis Date   Atrial fibrillation (HCC)    Atypical chest pain    CAD (coronary artery disease)    CHF (congestive heart failure) (HCC)    Dyslipidemia    Ex-smoker    History of depression    Hyperlipidemia    Insomnia    Ischemic cardiomyopathy    Ejection fraction 40-45%   NSTEMI (non-ST elevated myocardial infarction) (HCC) 08/01/2007   Treated with a bare metal stent to the proximal LAD, pt states MI x3   Suicide attempt (HCC) 01/30/2001    Past Surgical History:  Procedure Laterality Date   ADENOIDECTOMY     CARDIOVERSION N/A 12/06/2020   Procedure: CARDIOVERSION;  Surgeon: Laurey Morale, MD;  Location: [facility] ENDOSCOPY;  Service: Cardiovascular;  Laterality: N/A;   CCM GENERATOR AND A/V LEAD INSERTION N/A 04/17/2022   Procedure: CCM GENERATOR AND A/V LEAD INSERTION;  Surgeon: Lanier Prude, MD;  Location: [facility] INVASIVE CV LAB;  Service: Cardiovascular;  Laterality: N/A;   COLONOSCOPY     CORONARY ARTERY BYPASS GRAFT N/A 07/03/2020   Procedure: CORONARY ARTERY BYPASS GRAFTING (CABG) times four using left internal mammary artery, right arm radial artery and right leg saphenous vein;  Surgeon: Corliss Skains, MD;  Location: [facility] OR;  Service: Open Heart Surgery;  Laterality: N/A;   CORONARY STENT INTERVENTION N/A 12/03/2020   Procedure: CORONARY STENT INTERVENTION;  Surgeon: Swaziland, Peter M, MD;  Location: [facility] INVASIVE CV LAB;  Service:  Cardiovascular;  Laterality: N/A;   CORONARY STENT PLACEMENT     Bare metal stent to proximal LAD   ICD IMPLANT N/A 02/10/2021   Procedure: ICD IMPLANT;  Surgeon: Lanier Prude, MD;  Location: [facility] INVASIVE CV LAB;  Service: Cardiovascular;  Laterality: N/A;   LEFT HEART CATH AND CORONARY ANGIOGRAPHY N/A 07/01/2020   Procedure: LEFT HEART CATH AND CORONARY ANGIOGRAPHY;  Surgeon: Runell Gess, MD;  Location: [facility] INVASIVE CV LAB;  Service: Cardiovascular;  Laterality: N/A;   LEFT HEART CATH AND CORS/GRAFTS ANGIOGRAPHY N/A 12/03/2020   Procedure: LEFT HEART CATH AND CORS/GRAFTS ANGIOGRAPHY;  Surgeon: Swaziland, Peter M, MD;  Location: [facility] INVASIVE CV LAB;  Service: Cardiovascular;  Laterality: N/A;   RADIAL ARTERY HARVEST Right 07/03/2020   Procedure: RADIAL ARTERY HARVEST;  Surgeon: Corliss Skains, MD;  Location: [facility] OR;  Service: Open Heart Surgery;  Laterality: Right;   TEE WITHOUT CARDIOVERSION N/A 07/03/2020   Procedure: TRANSESOPHAGEAL ECHOCARDIOGRAM (TEE);  Surgeon: Corliss Skains, MD;  Location: [facility] OR;  Service: Open Heart Surgery;  Laterality: N/A;   TYMPANOSTOMY TUBE PLACEMENT     when I was a kid   UPPER GASTROINTESTINAL ENDOSCOPY  Home Medications:  Prior to Admission medications   Medication Sig Start Date End Date Taking? Authorizing Provider  apixaban (ELIQUIS) 5 MG TABS tablet Do not resume until Feb 22 Patient taking differently: Take 5 mg by mouth 2 (two) times daily. 04/18/22  Yes Mealor, Roberts Gaudy, MD  atorvastatin (LIPITOR) 80 MG tablet Take 1 tablet (80 mg total) by mouth daily. 08/26/21  Yes Laurey Morale, MD  carvedilol (COREG) 3.125 MG tablet TAKE ONE TABLET BY MOUTH TWICE DAILY WITH MEALS 05/04/22  Yes Graciella Freer, PA-C  dapagliflozin propanediol (FARXIGA) 10 MG TABS tablet Take 1 tablet (10 mg total) by mouth daily before breakfast. 09/17/22  Yes Laurey Morale, MD  linaclotide [facility]) 290 MCG CAPS capsule Take 1 capsule (290 mcg  total) by mouth daily before breakfast. Patient taking differently: Take 290 mcg by mouth daily as needed (constipation). 08/04/22  Yes Imogene Burn, MD  nitroGLYCERIN (NITROSTAT) 0.4 MG SL tablet Place 1 tablet (0.4 mg total) under the tongue every 5 (five) minutes x 3 doses as needed for chest pain. 01/27/22  Yes Laurey Morale, MD  pantoprazole (PROTONIX) 40 MG tablet Take 1 tablet (40 mg total) by mouth daily. 09/24/22  Yes Laurey Morale, MD  sacubitril-valsartan (ENTRESTO) 24-26 MG Take 1 tablet by mouth 2 (two) times daily. 08/26/21  Yes Laurey Morale, MD  sodium zirconium cyclosilicate (LOKELMA) 5 g packet Take 5 g by mouth daily. Patient taking differently: Take 5 g by mouth every Monday, Wednesday, and Friday at 8 PM. 03/13/21  Yes Laurey Morale, MD  spironolactone (ALDACTONE) 25 MG tablet Take 25 mg by mouth daily. 04/07/22  Yes [provider]    Inpatient Medications: Scheduled Meds:  Continuous Infusions:  PRN Meds:   Allergies:    Allergies  Allergen Reactions   Codeine Itching    Social History:   Social History   Socioeconomic History   Marital status: Single    Spouse name: Not on file   Number of children: Not on file   Years of education: Not on file   Highest education level: High school graduate  Occupational History   Occupation: Not actively working    Comment: applied for disability x 2.   Tobacco Use   Smoking status: Former    Current packs/day: 0.00    Average packs/day: 1 pack/day for 22.0 years (22.0 ttl pk-yrs)    Types: Cigarettes, Cigars    Start date: 06/29/1998    Quit date: 06/28/2020    Years since quitting: 2.3   Smokeless tobacco: Never   Tobacco comments:    cigarettes stopped in 2020, [employer] started in 2018-04-05/day  Vaping Use   Vaping status: Never Used  Substance and Sexual Activity   Alcohol use: Never   Drug use: Not Currently    Types: Marijuana   Sexual activity: Never  Other Topics Concern   Not  on file  Social History Narrative   Single   Lives with his parents   Trying to get his GED   DeGent date all cultures   Social Determinants of Health   Financial Resource Strain: Medium Risk (12/04/2020)   Overall Financial Resource Strain (CARDIA)    Difficulty of Paying Living Expenses: Somewhat hard  Food Insecurity: No Food Insecurity (12/04/2020)   Hunger Vital Sign    Worried About Running Out of Food in the Last Year: Never true    Ran Out of Food in the Last Year:  Never true  Transportation Needs: No Transportation Needs (12/04/2020)   PRAPARE - Administrator, Civil Service (Medical): No    Lack of Transportation (Non-Medical): No  Physical Activity: Not on file  Stress: Not on file  Social Connections: Not on file  Intimate Partner Violence: Not on file    Family History:    Family History  Problem Relation Age of Onset   Colon cancer Father 51   Colon cancer Paternal Grandfather    Colon cancer Paternal Aunt    Hypertension Neg Hx    Diabetes Neg Hx    Coronary artery disease Neg Hx    Esophageal cancer Neg Hx    Rectal cancer Neg Hx    Stomach cancer Neg Hx      ROS:  Please see the history of present illness.  All other ROS reviewed and negative.     Physical Exam/Data:   Vitals:   10/23/22 0915 10/23/22 0930 10/23/22 1015 10/23/22 1100  BP: 101/75 119/85 102/80 104/79  Pulse: 61 65 63 61  Resp: 14 18 12 12   Temp:      TempSrc:      SpO2: 100% 100% 97% 99%   No intake or output data in the 24 hours ending 10/23/22 1344    10/20/2022    8:06 AM 08/04/2022    8:18 AM 04/17/2022    2:15 PM  Last 3 Weights  Weight (lbs) 203 lb 6.4 oz 206 lb 12.8 oz 200 lb  Weight (kg) 92.262 kg 93.804 kg 90.719 kg     There is no height or weight on file to calculate BMI.  General:  Well nourished, well developed, in no acute distress. Laying in the bed with head elevated  HEENT: normal Neck: no JVD Cardiac:  normal S1, S2; RRR; faint systolic murmur  at left sternal border  Lungs:  clear to auscultation bilaterally, no wheezing, rhonchi or rales  Abd: soft, nontender Ext: no edema in BLE Musculoskeletal:  No deformities, BUE and BLE strength normal and equal Skin: warm and dry  Neuro:  CNs 2-12 intact, no focal abnormalities noted Psych:  Normal affect   EKG:  The EKG was personally reviewed and demonstrates:  Sinus rhythm, nonspecific IVCD, no changes compared to previous EKGs Telemetry:  Telemetry was personally reviewed and demonstrates:  NSR, occasional A-V pacing   Relevant CV Studies:   Laboratory Data:  High Sensitivity Troponin:   Recent Labs  Lab 10/23/22 0607 10/23/22 0758  TROPONINIHS 22* 24*     Chemistry Recent Labs  Lab 10/20/22 0838 10/23/22 0607  NA 144 141  K 4.5 4.5  CL 110* 110  CO2 19* 21*  GLUCOSE 96 89  BUN 13 21*  CREATININE 0.90 1.03  CALCIUM 9.4 8.5*  GFRNONAA  --  >60  ANIONGAP  --  10    Recent Labs  Lab 10/23/22 0607  PROT 5.6*  ALBUMIN 3.0*  AST 65*  ALT 93*  ALKPHOS 68  BILITOT 0.8   Lipids No results for input(s): "CHOL", "TRIG", "HDL", "LABVLDL", "LDLCALC", "CHOLHDL" in the last 168 hours.  Hematology Recent Labs  Lab 10/20/22 0838 10/23/22 0607  WBC 9.4 7.5  RBC 5.14 4.67  HGB 15.7 14.0  HCT 47.9 43.9  MCV 93 94.0  MCH 30.5 30.0  MCHC 32.8 31.9  RDW 13.1 13.6  PLT 216 187   Thyroid No results for input(s): "TSH", "FREET4" in the last 168 hours.  BNP Recent Labs  Lab  10/20/22 0838 10/23/22 0607  BNP  --  70.9  PROBNP 441*  --     DDimer No results for input(s): "DDIMER" in the last 168 hours.   Radiology/Studies:  DG Chest Port 1 View  Result Date: 10/23/2022 CLINICAL DATA:  37 year old male with history of chest pain and shortness of breath. EXAM: PORTABLE CHEST 1 VIEW COMPARISON:  Chest x-ray 04/18/2022. FINDINGS: Lung volumes are slightly low. No definite consolidative airspace disease or pleural effusions. No pneumothorax. No evidence of pulmonary  edema. Heart size is normal. Upper mediastinal contours are within normal limits. Status post median sternotomy for CABG including LIMA. Bilateral pacemaker devices are noted with lead tips projecting over the expected location of the right atrium and right ventricle. IMPRESSION: 1. Low lung volumes without radiographic evidence of acute cardiopulmonary disease. 2. Postoperative changes and support apparatus, as above. Electronically Signed   By: Trudie Reed M.D.   On: 10/23/2022 06:44   CUP PACEART INCLINIC DEVICE CHECK  Result Date: 10/20/2022 ICD check in clinic. Normal device function. Thresholds and sensing consistent with previous device measurements. Impedance trends stable over time. Mode switches show far field oversensing of the CCM device. No ventricular arrhythmias. Histogram distribution appropriate for patient and level of activity. No changes made this session. Device programmed at appropriate safety margins. Device programmed to optimize intrinsic conduction. Estimated longevity 12.5 years. Pt enrolled in remote follow-up. Patient education completed.    Assessment and Plan:   Chest Pain  - Patient presented to the ED complaining of an episode of chest pain that started this AM around 3 AM. Pain has been constant since that time. Occurred while at rest and not worse with exertion. Worse with deep inhalation, cough. Reproducible on palpation  - hsTN 22, 24- trend not consistent with ACS  - EKG without ischemic changes- similar to previous EKGs - CXR without evidence of acute cardiopulmonary disease - Overall, patient's workup suggests noncardiac chest pain. Suspect musculoskeletal pain as it is reproducible with palpation. I do not anticipate any inpatient ischemic evaluation.  - Patient has a follow up with the advanced heart failure clinic on 9/26  CAD s/p CABG  - Patient underwent CABGx4 with LIMA-LAD, SVG-diagonal, Right radial artery-OM, SVG-PDA in 06/2020 - Most recent  cardiac catheterization from 11/2020 showed patent LIMA-LAD with occlusion of all other grafts. He underwent PCI/DES to the native Lcx that was 99% stenosed. The native LAD and RCA were totally occluded - Continue lipitor 80 mg daily, carvedilol 3.125 mg BID  - Not on ASA given eliquis use   Chronic HFrEF  - Most recent echocardiogram from 11/2020 showed EF 35-40%, grade I DD, mildly reduced RV function - Patient overall euvolemic on exam today- BNP 70.9. Denies ankle edema, worsening DOE, orthopnea  - Continue home farxiga, carvedilol, entresto, spironolactone - patient has echocardiogram and follow up with AHF on 9/26   S/p ICD  - Followed by Dr. Lalla Brothers as an outpatient  - Device check from 10/20/22 showed normal device function, no ventricular arrhythmias   PAF  - Patient had an episode of Afib/flutter in 11/2020 in the setting of NSTEMI. Since then, afib burden has been 0% on his ICD  - Maintaining NSR in the ED  - Continue eliquis 5 mg BID for CHADS-VASc 3   History of LV thrombus - noted on cMRI from 06/2020 - CMRI from 11/2020 showed no LV thrombus   Risk Assessment/Risk Scores:      New York Heart Association (NYHA)  Functional Class NYHA Class II  CHA2DS2-VASc Score = 3  This indicates a 3.2% annual risk of stroke. The patient's score is based upon: CHF History: 1 HTN History: 1 Diabetes History: 0 Stroke History: 0 Vascular Disease History: 1 Age Score: 0 Gender Score: 0   For questions or updates, please contact [facility] Please consult [email or web address] for contact info under    Signed, Jonita Albee, PA-C  10/23/2022 1:44 PM  I have seen and examined the patient along with Jonita Albee, PA-C .  I have reviewed the chart, notes and new data.  I agree with PA/NP's note.  Key new complaints: ongoing chest pain for about 8 hours. Pattern is nonspecific. NTG did not help. Key examination changes: no evidence of hypervolemia/HF - JVP  normal, no edema, clear lungs. No rub/murmur, RRR. Key new findings / data: ECG in NSR, unchanged anteroseptal "frozen infarct" pattern. Troponin upper normal and adynamic (second sample drawn 5 h after symptom onset) and BNP normal (and lowest ever recorded in him).  Recent ICD check was OK.  PLAN: Despite his impressively rich cardiac history, cannot identify an active cardiac cause for his complaints. Lack of response to NTG and prolonged symptoms without ECG/biomarker changes suggest non-coronary etiology. Only possibly useful test we could do is a nuclear perfusion study (not available over the weekends). Not sure that would be of any help either, considering his known coronary anatomy and previous myocardial injury (extensive areas of scar by MRI, which would limit our ability to detect reversible ischemia). If admitted, will follow up over the weekend. Otherwise he is scheduled for echo and clinic F/u on 9/26 w Dr. Shirlee Latch.  Thurmon Fair, MD, [facility] [phone] 10/23/2022, 1:53 PM

## 2022-10-23 NOTE — ED Provider Notes (Signed)
Patient signed out to me at 0700 by Dr. Madilyn Hook pending repeat troponin and reassessment.  In short this is a 37 year old male with significant cardiac history status post CABG, CHF with ICD in place, paroxysmal A-fib on Eliquis presenting to the emergency department with chest pain.  Chest pain started earlier this morning while watching TV.  He states it feels like a pressure type of pain with some mild shortness of breath.  He took his nitro without any improvement and was given aspirin, nitro and morphine by EMS with some mild improvement.  On his arrival here he is hemodynamically stable in no acute distress.  Initial EKG showed normal sinus rhythm without acute ischemic changes.  Initial troponin is 22.  Patient signed out to me pending repeat troponin and possible cardiology evaluation.  On my arrival the patient reports about a 7 out of 10 in pain.  Heart is regular rate and rhythm and lungs are clear to auscultation bilaterally.  Will be given additional morphine for pain.  Clinical Course as of 10/23/22 1457  Fri Oct 23, 2022  1610 Repeat troponin flat at 24, patient with ongoing pain. Due to his high risk chest pain will consult cardiology for disposition recommendations. [VK]  [id number] Cardiology has lower concern for cardiac etiology with ongoing pain, negative troponins and unchanged EKG. Recommended observation vs outpatient follow up. [VK]  1422 Using shared decision making patient preferred admission for observation. Still having ongoing CP. [VK]  1456 Patient signed out to internal medicine teaching team. [VK]    Clinical Course User Index [VK] Rexford Maus, DO      Rexford Maus, [place] 10/23/22 1456

## 2022-10-23 NOTE — ED Provider Notes (Signed)
EMERGENCY DEPARTMENT AT [facility] Provider Note   CSN: [id number] Arrival date & time: 10/23/22  4098     History  Chief Complaint  Patient presents with   Chest Pain    Wyatt Donovan is a 37 y.o. male.   Chest Pain Wyatt Donovan is a 37 y.o. male who presents to the Emergency Department complaining of chest pain.  He presents to the ED by EMS for evaluation of central chest pain/tightness that started one hour prior to ED arrival while watching TV.  He had similar pain yesterday that resolved with one nitroglycerin.  Today he took three nitroglycerin with persistent pain and EMS was called and he was treated with ASA, additional ntg and morphine. At time of ED evaluation pain is 7.5.  Has associated sob.  Vomited yesterday secondary to pain.  Has heartburn.  No fever.  Has cough - nonproductive. Has abdominal pain for the last several months. Has constipation. No leg swelling/pain.   Ran out of protonix last week.   Takes eliquis.     Home Medications Prior to Admission medications   Medication Sig Start Date End Date Taking? Authorizing Provider  apixaban (ELIQUIS) 5 MG TABS tablet Do not resume until Feb 22 04/18/22   Mealor, Roberts Gaudy, MD  atorvastatin (LIPITOR) 80 MG tablet Take 1 tablet (80 mg total) by mouth daily. 08/26/21   Laurey Morale, MD  carvedilol (COREG) 3.125 MG tablet TAKE ONE TABLET BY MOUTH TWICE DAILY WITH MEALS 05/04/22   Graciella Freer, PA-C  dapagliflozin propanediol (FARXIGA) 10 MG TABS tablet Take 1 tablet (10 mg total) by mouth daily before breakfast. 09/17/22   Laurey Morale, MD  hydrOXYzine (VISTARIL) 50 MG capsule Take 50 mg by mouth at bedtime. Patient not taking: Reported on 10/20/2022    [provider]  linaclotide Karlene Einstein) 290 MCG CAPS capsule Take 1 capsule (290 mcg total) by mouth daily before breakfast. 08/04/22   Imogene Burn, MD  nitroGLYCERIN (NITROSTAT) 0.4 MG SL tablet Place 1 tablet (0.4 mg  total) under the tongue every 5 (five) minutes x 3 doses as needed for chest pain. 01/27/22   Laurey Morale, MD  pantoprazole (PROTONIX) 40 MG tablet Take 1 tablet (40 mg total) by mouth daily. 09/24/22   Laurey Morale, MD  sacubitril-valsartan (ENTRESTO) 24-26 MG Take 1 tablet by mouth 2 (two) times daily. 08/26/21   Laurey Morale, MD  sodium zirconium cyclosilicate (LOKELMA) 5 g packet Take 5 g by mouth daily. 03/13/21   Laurey Morale, MD  spironolactone (ALDACTONE) 25 MG tablet Take 25 mg by mouth in the morning. 04/07/22   [provider]      Allergies    Codeine    Review of Systems   Review of Systems  Cardiovascular:  Positive for chest pain.  All other systems reviewed and are negative.   Physical Exam Updated Vital Signs BP 107/81   Pulse 67   Temp 97.7 F (36.5 C) (Oral)   Resp 17   SpO2 98%  Physical Exam Vitals and nursing note reviewed.  Constitutional:      Appearance: He is well-developed.  HENT:     Head: Normocephalic and atraumatic.  Cardiovascular:     Rate and Rhythm: Normal rate and regular rhythm.     Heart sounds: No murmur heard. Pulmonary:     Effort: Pulmonary effort is normal. No respiratory distress.     Breath sounds:  Normal breath sounds.  Abdominal:     Palpations: Abdomen is soft.     Tenderness: There is no guarding or rebound.     Comments: Mild generalized abdominal tenderness  Musculoskeletal:        General: No tenderness.  Skin:    General: Skin is warm and dry.  Neurological:     Mental Status: He is alert and oriented to person, place, and time.  Psychiatric:        Behavior: Behavior normal.     ED Results / Procedures / Treatments   Labs (all labs ordered are listed, but only abnormal results are displayed) Labs Reviewed  COMPREHENSIVE METABOLIC PANEL - Abnormal; Notable for the following components:      Result Value   CO2 21 (*)    BUN 21 (*)    Calcium 8.5 (*)    Total Protein 5.6 (*)    Albumin  3.0 (*)    AST 65 (*)    ALT 93 (*)    All other components within normal limits  TROPONIN I (HIGH SENSITIVITY) - Abnormal; Notable for the following components:   Troponin I (High Sensitivity) 22 (*)    All other components within normal limits  BRAIN NATRIURETIC PEPTIDE  CBC WITH DIFFERENTIAL/PLATELET  LIPASE, BLOOD    EKG None  Radiology DG Chest Port 1 View  Result Date: 10/23/2022 CLINICAL DATA:  37 year old male with history of chest pain and shortness of breath. EXAM: PORTABLE CHEST 1 VIEW COMPARISON:  Chest x-ray 04/18/2022. FINDINGS: Lung volumes are slightly low. No definite consolidative airspace disease or pleural effusions. No pneumothorax. No evidence of pulmonary edema. Heart size is normal. Upper mediastinal contours are within normal limits. Status post median sternotomy for CABG including LIMA. Bilateral pacemaker devices are noted with lead tips projecting over the expected location of the right atrium and right ventricle. IMPRESSION: 1. Low lung volumes without radiographic evidence of acute cardiopulmonary disease. 2. Postoperative changes and support apparatus, as above. Electronically Signed   By: Trudie Reed M.D.   On: 10/23/2022 06:44    Procedures Procedures    Medications Ordered in ED Medications  pantoprazole (PROTONIX) injection 40 mg (40 mg Intravenous Given 10/23/22 4098)    ED Course/ Medical Decision Making/ A&P                                 Medical Decision Making Amount and/or Complexity of Data Reviewed Labs: ordered. Radiology: ordered.  Risk Prescription drug management.   Patient with extensive cardiac history here for evaluation of central chest pain.  He did have 1 episode yesterday that resolved with nitroglycerin.  He took 3 at home today with persistent pain.  He also incidentally ran out of his Protonix last week and does report some reflux symptoms as well.  Will provide a dose of Protonix in the emergency department.  EKG  is abnormal but similar when compared to priors.  Initial troponin is minimally elevated.  Patient care transferred pending repeat troponin, possible cardiology evaluation.        Final Clinical Impression(s) / ED Diagnoses Final diagnoses:  None    Rx / DC Orders ED Discharge Orders     None         Tilden Fossa, MD 10/23/22 [phone]

## 2022-10-23 NOTE — ED Notes (Signed)
ED TO INPATIENT HANDOFF REPORT  ED Nurse Name and Phone #: Tori [phone]  S Name/Age/Gender Wyatt Donovan 37 y.o. male Room/Bed: H011C/H011C  Code Status   Code Status: Full Code  Home/SNF/Other Home Patient oriented to: self, place, time, and situation Is this baseline? Yes   Triage Complete: Triage complete  Chief Complaint Unstable angina (HCC) [I20.0]  Triage Note Pt with hx of cardiac hx with triple bypass, and stents as well as a rechargeable pacemaker and ICD. Had chest pain and tightness. Pt has 10 mg morphine and 1 nitroglycerin tab via EMS.    Allergies Allergies  Allergen Reactions   Codeine Itching    Level of Care/Admitting Diagnosis ED Disposition     ED Disposition  Admit   Condition  --   Comment  Hospital Area: [facility] [[id number]]  Level of Care: Telemetry Cardiac [[id number]]  May place patient in observation at [facility] or [facility] if equivalent level of care is available:: No  Covid Evaluation: Asymptomatic - no recent exposure (last 10 days) testing not required  Diagnosis: Unstable angina [facility]) [[id number]]  Admitting Physician: Gust Rung [[id number]]  Attending Physician: Gust Rung [[id number]]          B Medical/Surgery History Past Medical History:  Diagnosis Date   Atrial fibrillation (HCC)    Atypical chest pain    CAD (coronary artery disease)    CHF (congestive heart failure) (HCC)    Dyslipidemia    Ex-smoker    History of depression    Hyperlipidemia    Insomnia    Ischemic cardiomyopathy    Ejection fraction 40-45%   NSTEMI (non-ST elevated myocardial infarction) (HCC) 08/01/2007   Treated with a bare metal stent to the proximal LAD, pt states MI x3   Suicide attempt (HCC) 01/30/2001   Past Surgical History:  Procedure Laterality Date   ADENOIDECTOMY     CARDIOVERSION N/A 12/06/2020   Procedure: CARDIOVERSION;  Surgeon: Laurey Morale, MD;  Location: [facility] ENDOSCOPY;  Service: Cardiovascular;   Laterality: N/A;   CCM GENERATOR AND A/V LEAD INSERTION N/A 04/17/2022   Procedure: CCM GENERATOR AND A/V LEAD INSERTION;  Surgeon: Lanier Prude, MD;  Location: [facility] INVASIVE CV LAB;  Service: Cardiovascular;  Laterality: N/A;   COLONOSCOPY     CORONARY ARTERY BYPASS GRAFT N/A 07/03/2020   Procedure: CORONARY ARTERY BYPASS GRAFTING (CABG) times four using left internal mammary artery, right arm radial artery and right leg saphenous vein;  Surgeon: Corliss Skains, MD;  Location: [facility] OR;  Service: Open Heart Surgery;  Laterality: N/A;   CORONARY STENT INTERVENTION N/A 12/03/2020   Procedure: CORONARY STENT INTERVENTION;  Surgeon: Swaziland, Peter M, MD;  Location: [facility] INVASIVE CV LAB;  Service: Cardiovascular;  Laterality: N/A;   CORONARY STENT PLACEMENT     Bare metal stent to proximal LAD   ICD IMPLANT N/A 02/10/2021   Procedure: ICD IMPLANT;  Surgeon: Lanier Prude, MD;  Location: [facility] INVASIVE CV LAB;  Service: Cardiovascular;  Laterality: N/A;   LEFT HEART CATH AND CORONARY ANGIOGRAPHY N/A 07/01/2020   Procedure: LEFT HEART CATH AND CORONARY ANGIOGRAPHY;  Surgeon: Runell Gess, MD;  Location: [facility] INVASIVE CV LAB;  Service: Cardiovascular;  Laterality: N/A;   LEFT HEART CATH AND CORS/GRAFTS ANGIOGRAPHY N/A 12/03/2020   Procedure: LEFT HEART CATH AND CORS/GRAFTS ANGIOGRAPHY;  Surgeon: Swaziland, Peter M, MD;  Location: [facility] INVASIVE CV LAB;  Service: Cardiovascular;  Laterality: N/A;   RADIAL ARTERY HARVEST  Right 07/03/2020   Procedure: RADIAL ARTERY HARVEST;  Surgeon: Corliss Skains, MD;  Location: [facility] OR;  Service: Open Heart Surgery;  Laterality: Right;   TEE WITHOUT CARDIOVERSION N/A 07/03/2020   Procedure: TRANSESOPHAGEAL ECHOCARDIOGRAM (TEE);  Surgeon: Corliss Skains, MD;  Location: [facility] OR;  Service: Open Heart Surgery;  Laterality: N/A;   TYMPANOSTOMY TUBE PLACEMENT     when I was a kid   UPPER GASTROINTESTINAL ENDOSCOPY       A IV Location/Drains/Wounds Patient  Lines/Drains/Airways Status     Active Line/Drains/Airways     Name Placement date Placement time Site Days   Peripheral IV 10/23/22 20 G Posterior;Left Hand 10/23/22  0621  Hand  less than 1            Intake/Output Last 24 hours No intake or output data in the 24 hours ending 10/23/22 1611  Labs/Imaging Results for orders placed or performed during the hospital encounter of 10/23/22 (from the past 48 hour(s))  Comprehensive metabolic panel     Status: Abnormal   Collection Time: 10/23/22  6:07 AM  Result Value Ref Range   Sodium 141 135 - 145 mmol/L   Potassium 4.5 3.5 - 5.1 mmol/L   Chloride 110 98 - 111 mmol/L   CO2 21 (L) 22 - 32 mmol/L   Glucose, Bld 89 70 - 99 mg/dL    Comment: Glucose reference range applies only to samples taken after fasting for at least 8 hours.   BUN 21 (H) 6 - 20 mg/dL   Creatinine, Ser 1.61 0.61 - 1.24 mg/dL   Calcium 8.5 (L) 8.9 - 10.3 mg/dL   Total Protein 5.6 (L) 6.5 - 8.1 g/dL   Albumin 3.0 (L) 3.5 - 5.0 g/dL   AST 65 (H) 15 - 41 U/L   ALT 93 (H) 0 - 44 U/L   Alkaline Phosphatase 68 38 - 126 U/L   Total Bilirubin 0.8 0.3 - 1.2 mg/dL   GFR, Estimated >09 >60 mL/min    Comment: (NOTE) Calculated using the CKD-EPI Creatinine Equation (2021)    Anion gap 10 5 - 15    Comment: Performed at [facility], [address] [place], [place] [address]  Brain natriuretic peptide     Status: None   Collection Time: 10/23/22  6:07 AM  Result Value Ref Range   B Natriuretic Peptide 70.9 0.0 - 100.0 pg/mL    Comment: Performed at [facility] Lab, [address] [place], [place] [address]  Troponin I (High Sensitivity)     Status: Abnormal   Collection Time: 10/23/22  6:07 AM  Result Value Ref Range   Troponin I (High Sensitivity) 22 (H) <18 ng/L    Comment: (NOTE) Elevated high sensitivity troponin I (hsTnI) values and significant  changes across serial measurements may suggest ACS but many other  chronic and acute conditions are  known to elevate hsTnI results.  Refer to the "Links" section for chest pain algorithms and additional  guidance. Performed at [facility], [address], [place], [place] [address]   CBC with Differential     Status: None   Collection Time: 10/23/22  6:07 AM  Result Value Ref Range   WBC 7.5 4.0 - 10.5 K/uL   RBC 4.67 4.22 - 5.81 MIL/uL   Hemoglobin 14.0 13.0 - 17.0 g/dL   HCT 56.2 13.0 - 86.5 %   MCV 94.0 80.0 - 100.0 fL   MCH 30.0 26.0 - 34.0  pg   MCHC 31.9 30.0 - 36.0 g/dL   RDW 08.6 57.8 - 46.9 %   Platelets 187 150 - 400 K/uL   nRBC 0.0 0.0 - 0.2 %   Neutrophils Relative % 55 %   Neutro Abs 4.1 1.7 - 7.7 K/uL   Lymphocytes Relative 29 %   Lymphs Abs 2.2 0.7 - 4.0 K/uL   Monocytes Relative 12 %   Monocytes Absolute 0.9 0.1 - 1.0 K/uL   Eosinophils Relative 3 %   Eosinophils Absolute 0.2 0.0 - 0.5 K/uL   Basophils Relative 1 %   Basophils Absolute 0.1 0.0 - 0.1 K/uL   Immature Granulocytes 0 %   Abs Immature Granulocytes 0.03 0.00 - 0.07 K/uL    Comment: Performed at [facility], [address], [place], [place] [address]  Lipase, blood     Status: None   Collection Time: 10/23/22  6:07 AM  Result Value Ref Range   Lipase 29 11 - 51 U/L    Comment: Performed at [facility], [address], [place], [place] [address]  Troponin I (High Sensitivity)     Status: Abnormal   Collection Time: 10/23/22  7:58 AM  Result Value Ref Range   Troponin I (High Sensitivity) 24 (H) <18 ng/L    Comment: (NOTE) Elevated high sensitivity troponin I (hsTnI) values and significant  changes across serial measurements may suggest ACS but many other  chronic and acute conditions are known to elevate hsTnI results.  Refer to the "Links" section for chest pain algorithms and additional  guidance. Performed at [facility], [address] [place], [place] [address]    DG Chest Port 1 View  Result Date: 10/23/2022 CLINICAL DATA:  37 year old male with history  of chest pain and shortness of breath. EXAM: PORTABLE CHEST 1 VIEW COMPARISON:  Chest x-ray 04/18/2022. FINDINGS: Lung volumes are slightly low. No definite consolidative airspace disease or pleural effusions. No pneumothorax. No evidence of pulmonary edema. Heart size is normal. Upper mediastinal contours are within normal limits. Status post median sternotomy for CABG including LIMA. Bilateral pacemaker devices are noted with lead tips projecting over the expected location of the right atrium and right ventricle. IMPRESSION: 1. Low lung volumes without radiographic evidence of acute cardiopulmonary disease. 2. Postoperative changes and support apparatus, as above. Electronically Signed   By: Trudie Reed M.D.   On: 10/23/2022 06:44    Pending Labs Unresulted Labs (From admission, onward)     Start     Ordered   10/24/22 0500  HIV Antibody (routine testing w rflx)  (HIV Antibody (Routine testing w reflex) panel)  Tomorrow morning,   R        10/23/22 1604   10/24/22 0500  Comprehensive metabolic panel  Tomorrow morning,   R        10/23/22 1604   10/24/22 0500  CBC  Tomorrow morning,   R        10/23/22 1604            Vitals/Pain Today's Vitals   10/23/22 0930 10/23/22 1015 10/23/22 1100 10/23/22 1449  BP: 119/85 102/80 104/79 108/75  Pulse: 65 63 61 67  Resp: 18 12 12 16   Temp:    97.9 F (36.6 C)  TempSrc:    Oral  SpO2: 100% 97% 99% 92%  PainSc:        Isolation Precautions No active isolations  Medications Medications  polyethylene glycol (MIRALAX /  GLYCOLAX) packet 17 g (has no administration in time range)  apixaban (ELIQUIS) tablet 5 mg (has no administration in time range)  atorvastatin (LIPITOR) tablet 80 mg (has no administration in time range)  carvedilol (COREG) tablet 3.125 mg (has no administration in time range)  dapagliflozin propanediol (FARXIGA) tablet 10 mg (has no administration in time range)  pantoprazole (PROTONIX) EC tablet 40 mg (has no  administration in time range)  sacubitril-valsartan (ENTRESTO) 24-26 mg per tablet (has no administration in time range)  spironolactone (ALDACTONE) tablet 25 mg (has no administration in time range)  lidocaine (LIDODERM) 5 % 1 patch (has no administration in time range)  morphine (PF) 4 MG/ML injection 4 mg (has no administration in time range)  acetaminophen (TYLENOL) tablet 650 mg (has no administration in time range)  pantoprazole (PROTONIX) injection 40 mg (40 mg Intravenous Given 10/23/22 0621)  morphine (PF) 4 MG/ML injection 4 mg (4 mg Intravenous Given 10/23/22 0758)  famotidine (PEPCID) IVPB 20 mg premix (0 mg Intravenous Stopped 10/23/22 1000)  alum & mag hydroxide-simeth (MAALOX/MYLANTA) 200-200-20 MG/5ML suspension 30 mL (30 mLs Oral Given 10/23/22 0930)  morphine (PF) 4 MG/ML injection 4 mg (4 mg Intravenous Given 10/23/22 1443)    Mobility walks     Focused Assessments Cardiac Assessment Handoff:  Cardiac Rhythm: Normal sinus rhythm Lab Results  Component Value Date   CKTOTAL 281 12/05/2020   CKMB 35.6 (H) 08/31/2007   TROPONINI (HH) 08/31/2007    6.04        POSSIBLE MYOCARDIAL ISCHEMIA. SERIAL TESTING RECOMMENDED. CRITICAL RESULT CALLED TO, READ BACK BY AND VERIFIED WITH: OLCZAK,J RN [id number] [phone] JORDANS   No results found for: "DDIMER" Does the Patient currently have chest pain? No    R Recommendations: See Admitting Provider Note  Report given to:   Additional Notes: axox4

## 2022-10-23 NOTE — ED Notes (Signed)
Cardiology at bedside.

## 2022-10-23 NOTE — Hospital Course (Addendum)
Wyatt Donovan is a 37 year old male with PMH significant for MI in 2012 at age 1, CAD s/p CABG in 2022, ICD placement in 2022, Afib/flutter, LV apical thrombus, ischemic cardiomyopathy, HFrEF (EF 35-40% 11/2020), history of GI bleed while on anticoagulation, HTN, and HLD who presented for chest pain. Pt was seen by Cardiology in the ED and cardio thinks likely non-coronary etiology considering lack of response to NTG and prolonged symptoms without ECG/biomarker change and admitted for cardiac monitoring and continued surveillance of troponins.   #Unstable angina Pt presented with substernal chest pain at rest not relieved by NGT without cardiac biomarkers. Not reporducible on palpation, deep inhalation, and positional changes. Troponins 22, 24. BNP 70.9. EKG without ischemic changes, CXR without evidence of cardiopulmonary disease. Recent ICD check was OK.  - Continue to trend troponins  - Continue cardiac monitoring - Possible nuclear perfusion scan after the weekend if patient still here. However, extensive areas of scar would limit ability to detect reversible ischemia - Continue morphine PRN and lidocaine patch on chest - Has outpatient appointment with Cardio 9/26  #Lightheadedness/dizziness #Diaphoresis  - Orthostatic vitals - PT/OT consult   #CAD s/p CABG - Continue home Atorvastatin 80mg . Not on Aspirin given on Eliquis and hx of GI bleed on anticoagulation   #Chronic HFrEF Echo 11/2020 showed EF 35-40%. BNP today 70.9. No JVD or LE edema on exam.  - Continue home Coreg 3.125mg  BID, Farxiga 10mg  once a day, Entresto 24-26mg  BID, Spironolactone 25mg  once a day  #s/p ICD  Device check 10/20/22 showed normal device function and no ventricular arrhythmias.  Sees Dr. Lalla Brothers outpatient.   #paroxysmal Afib #LV apical thrombus cMRI 06/2020 noted thrombus and cMRI 11/2020 showed no LV thrombus. Sinus  - Continue home Eliquis 5mg  BID  #History of GI bleed #GERD - Continue home  Protonix 40mg  daily   #HTN - Continue home Coreg 3.125mg  BID, Entresto 24-26mg  BID, Spironolactone 25mg  once a day  #HLD - Continue home Atorvastatin 80mg  once a day    8/24: Pt denies any chest pain, lightheadedness, SOB. Denies any acute concerns. Pt reports he has low BP at home.    Encounter Information  Provider Department Center  11/10/2022 7:00 AM [facility] DEVICE REMOTES [place] [facility]  11/26/2022 9:00 AM [facility] ECHO OP 1 [facility] ECHO LAB [facility]  11/26/2022 10:00 AM Laurey Morale, MD [facility]   02/09/2023 7:00 AM [facility] DEVICE REMOTES [place] [facility]  05/11/2023 7:00 AM [facility] DEVICE REMOTES [place] [facility]  08/10/2023 7:00 AM [facility] DEVICE REMOTES [place] [facility]  11/09/2023 7:00 AM [facility] DEVICE REMOTES  [facility]  [address]

## 2022-10-24 ENCOUNTER — Other Ambulatory Visit (HOSPITAL_COMMUNITY): Payer: Self-pay

## 2022-10-24 DIAGNOSIS — R0789 Other chest pain: Secondary | ICD-10-CM

## 2022-10-24 DIAGNOSIS — K2970 Gastritis, unspecified, without bleeding: Secondary | ICD-10-CM

## 2022-10-24 DIAGNOSIS — R079 Chest pain, unspecified: Secondary | ICD-10-CM | POA: Diagnosis not present

## 2022-10-24 LAB — COMPREHENSIVE METABOLIC PANEL
ALT: 67 U/L — ABNORMAL HIGH (ref 0–44)
AST: 32 U/L (ref 15–41)
Albumin: 2.9 g/dL — ABNORMAL LOW (ref 3.5–5.0)
Alkaline Phosphatase: 69 U/L (ref 38–126)
Anion gap: 7 (ref 5–15)
BUN: 15 mg/dL (ref 6–20)
CO2: 23 mmol/L (ref 22–32)
Calcium: 8.6 mg/dL — ABNORMAL LOW (ref 8.9–10.3)
Chloride: 106 mmol/L (ref 98–111)
Creatinine, Ser: 0.79 mg/dL (ref 0.61–1.24)
GFR, Estimated: >60 mL/min (ref >60)
Glucose, Bld: 101 mg/dL — ABNORMAL HIGH (ref 70–99)
Potassium: 5 mmol/L (ref 3.5–5.1)
Sodium: 136 mmol/L (ref 135–145)
Total Bilirubin: 0.8 mg/dL (ref 0.3–1.2)
Total Protein: 5.6 g/dL — ABNORMAL LOW (ref 6.5–8.1)

## 2022-10-24 LAB — CBC
HCT: 44.9 % (ref 39.0–52.0)
Hemoglobin: 14.1 g/dL (ref 13.0–17.0)
MCH: 29.6 pg (ref 26.0–34.0)
MCHC: 31.4 g/dL (ref 30.0–36.0)
MCV: 94.1 fL (ref 80.0–100.0)
Platelets: 191 10*3/uL (ref 150–400)
RBC: 4.77 MIL/uL (ref 4.22–5.81)
RDW: 13.3 % (ref 11.5–15.5)
WBC: 8.4 10*3/uL (ref 4.0–10.5)
nRBC: 0 % (ref 0.0–0.2)

## 2022-10-24 LAB — HIV ANTIBODY (ROUTINE TESTING W REFLEX): HIV Screen 4th Generation wRfx: NONREACTIVE

## 2022-10-24 MED ORDER — LIDOCAINE 4 % EX PTCH
1.0000 | MEDICATED_PATCH | CUTANEOUS | 0 refills | Status: AC
  Filled 2022-10-24: qty 5, 5d supply, fill #0
  Filled 2022-10-24: qty 6, 6d supply, fill #0

## 2022-10-24 MED ORDER — DAPAGLIFLOZIN PROPANEDIOL 10 MG PO TABS
10.0000 mg | ORAL_TABLET | Freq: Every day | ORAL | 0 refills | Status: AC
  Filled 2022-10-24: qty 30, 30d supply, fill #0

## 2022-10-24 MED ORDER — PANTOPRAZOLE SODIUM 40 MG PO TBEC
40.0000 mg | DELAYED_RELEASE_TABLET | Freq: Every day | ORAL | 0 refills | Status: DC
  Filled 2022-10-24: qty 30, 30d supply, fill #0

## 2022-10-24 MED ORDER — APIXABAN 5 MG PO TABS: 5.0000 mg | ORAL_TABLET | Freq: Two times a day (BID) | ORAL | Status: DC

## 2022-10-24 NOTE — Discharge Summary (Addendum)
Name: Wyatt Donovan MRN: [id number] DOB: 08-Mar-1985 37 y.o. PCP: Wyatt Deiters, MD  Date of Admission: 10/23/2022  5:37 AM Date of Discharge: 10/24/22 Attending Physician: Dr. Cleda Donovan  Discharge Diagnosis: Principal Problem:   Unstable angina [facility]) Active Problems:   Gastritis determined by endoscopy    Discharge Medications: Allergies as of 10/24/2022       Reactions   Codeine Itching        Medication List     TAKE these medications    apixaban 5 MG Tabs tablet Commonly known as: ELIQUIS Do not resume until Feb 22 What changed:  how much to take how to take this when to take this additional instructions   atorvastatin 80 MG tablet Commonly known as: LIPITOR Take 1 tablet (80 mg total) by mouth daily.   carvedilol 3.125 MG tablet Commonly known as: COREG TAKE ONE TABLET BY MOUTH TWICE DAILY WITH MEALS   dapagliflozin propanediol 10 MG Tabs tablet Commonly known as: Farxiga Take 1 tablet (10 mg total) by mouth daily before breakfast.   Entresto 24-26 MG Generic drug: sacubitril-valsartan Take 1 tablet by mouth 2 (two) times daily.   lidocaine 5 % Commonly known as: LIDODERM Place 1 patch onto the skin daily for 5 days. Remove & Discard patch within 12 hours or as directed by MD   linaclotide 290 MCG Caps capsule Commonly known as: Linzess Take 1 capsule (290 mcg total) by mouth daily before breakfast. What changed:  when to take this reasons to take this   Lokelma 5 g packet Generic drug: sodium zirconium cyclosilicate Take 5 g by mouth daily. What changed: when to take this   nitroGLYCERIN 0.4 MG SL tablet Commonly known as: NITROSTAT Place 1 tablet (0.4 mg total) under the tongue every 5 (five) minutes x 3 doses as needed for chest pain.   pantoprazole 40 MG tablet Commonly known as: PROTONIX Take 1 tablet (40 mg total) by mouth daily.   spironolactone 25 MG tablet Commonly known as: ALDACTONE Take 25 mg by mouth daily.         Disposition and follow-up:   Wyatt Donovan was discharged from [facility] in Stable condition.  At the hospital follow up visit please address:  1.  Follow-up:  a. Non-cardiac chest pain: Troponins remained flat at 22, 24, 39, 37. BNP 70.9. EKG normal and cardiac monitoring without acute events. Patient was seen by Cardio and considered to be likely non-cardiogenic chest pain in setting of lack of response to NTG and prolonged symptoms without ECG/biomarker changes. Patient was monitored overnight and chest pain resolved. Please access chest pain at next appointment.     b. GERD/Hx of GI bleed/ Hx of gastritis/Hx of intermittent hemorrhoids: Patient ran out of Protonix two weeks ago. He reported experiencing heart burn. His chest pain was substernal and upper mid-epigastric. We refilled Protonix at discharge. Please access for worsening reflux and hemorrhoid symptoms and refer to GI if indicated.    c. HFrEF (35-40% 11/2020) on GDMT and HTN:  Patient needed refill on Farxiga and it was refilled at discharge. He has a follow up Echo 11/26/22 at 9AM followed by an appointment with Wyatt Donovan at [facility]. Please access fluid status at appointment.   2.  Labs / imaging needed at time of follow-up: none  3.  Pending labs/ test needing follow-up: none  4.  Medication Changes  No changes to medications  Follow-up Appointments:  Provider Department Center  11/10/2022 7:00 AM [facility] DEVICE REMOTES [facility]  11/26/2022 9:00 AM [facility] ECHO OP 1 [facility]  11/26/2022 10:00 AM Wyatt Morale, MD [facility]   02/09/2023 7:00 AM [facility] DEVICE REMOTES [facility] at [facility]  05/11/2023 7:00 AM [facility] DEVICE REMOTES [facility]  08/10/2023 7:00 AM [facility] DEVICE REMOTES [facility]  at [facility]  11/09/2023 7:00 AM [facility] DEVICE REMOTES [facility] at [facility] Course by problem list: Wyatt Donovan is a 37 year old male with PMH significant for 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 Pt was seen by Cardiology in the ED and cardio thinks likely non-coronary etiology considering lack of response to NTG and prolonged symptoms without ECG/biomarker change and admitted for cardiac monitoring and continued surveillance of troponins.   #Non-cardiac chest pain  Pt presented with substernal chest pain at rest not relieved by NGT without cardiac biomarkers. Not reproducible on palpation, deep inhalation, and positional changes. Troponins 22, 24. BNP 70.9. EKG without ischemic changes, CXR without evidence of cardiopulmonary disease. Recent ICD on 10/20/22 showed normal device function and no ventricular arrhythmias. Patient was kept on cardiac monitoring overnight and we continued to trend his troponins and it remained flat at 39,37. Patient received morphine in the ED and reported that it relieved his chest pain. Patient was also applied a lidocaine patch to the chest overnight. On evaluation the following morning, patient reported no chest pain overnight and no complaints this morning. He has a follow up appointment with his cardiologist, Wyatt Donovan, on 11/26/22 at 10AM with an Echocardiogram scheduled for the same day at [facility].   #Lightheadedness/dizziness #Diaphoresis  Patient reported lightheadedness, dizziness, and diaphoresis that has been chronic. He feels it at rest and it is worsened with sitting up and standing up. He reports that he does make sure to stay well hydrated. Orthostatic vitals were taken in the ED and they were  negative. On evaluation this morning patient reported no further lightheadedness, dizziness, diaphoresis. Patient was able to sit up in bed without feeling symptomatic. Patient was evaluated by PT with no further therapy recommended.   #CAD s/p CABG Continued home Atorvastatin 80mg . Patient is not on Aspirin at home given that he is on Eliquis and has a history of GI bleed on anticoagulation.   #Chronic HFrEF Echo 11/2020 showed EF 35-40%. BNP on admission 70.9. No JVD or LE edema on exam. We continued home Coreg 3.125mg  BID, Farxiga 10mg  once a day, Entresto 24-26mg  BID, and Spironolactone 25mg  once a day. Patient reported running low on Farxiga at home and we refilled it at discharge. He has an echocardiogram scheduled for 11/26/22 at 9AM followed by an appointment with his cardiologist, Wyatt Donovan at [facility].   #s/p ICD  Device check 10/20/22 showed normal device function and no ventricular arrhythmias.  Patient sees Dr. Lalla Brothers and has an appointment for ICD check on 11/10/22 at 7AM.   #paroxysmal Afib #LV apical thrombus cMRI 06/2020 noted thrombus and cMRI 11/2020 showed no LV thrombus. Sinus rhythm on auscultation. Continued home Eliquis 5mg  BID.   #GERD #History of gastritis  #History of GI bleed #  History of intermittent hemorrhoids  Patient reported running out of Protonix 2 weeks ago and reported experiencing heart burn in the ED. Of note, EGD in May 2023 showed gastritis. Continued home Protonix 40mg  daily in the hospital and we refilled Protonix at discharge.   #HTN Continued home Coreg 3.125mg  BID, Entresto 24-26mg  BID, Spironolactone 25mg  once a day. Patient's blood pressure running low-normal in the hospital. Patient says that his blood pressure runs low-normal at home as well. He reports not feeling lightheaded or dizzy this morning on evaluation. His blood pressure is to goal on current medications. MAP 70.   #HLD Continued home Atorvastatin 80mg  once a day.   Discharge  Subjective: Patient seen at bedside, laying comfortably in bed. He reports no chest pain this morning and no chest pain overnight. Denied having lightheadedness, dizziness, diaphoresis. He reports having no concerns. He was informed by nursing staff his BP was low overnight, however he says that his BP tends to run low at home as well. He is not feeling symptomatic. His blood pressure is to goal with HFrEF GDMT.   Discharge Exam:   BP (!) 93/57   Pulse 65   Temp 97.8 F (36.6 C) (Oral)   Resp 20   Ht 6\' 1"  (1.854 m)   Wt 92.5 kg   SpO2 97%   BMI 26.90 kg/m   Physical Exam:  General: NAD, alert and oriented, cooperative.  HEENT: NCAT, vision grossly intact, severe teeth decay   Neck: Supple, trachea midline.  Lungs: normal respiratory effort on room air, CTAB. No crackle or wheeze. Good pulmonary excursion.  Chest: nontender to palpation Heart: RRR, no M/R/G.  Abdomen: +BS. Soft, NTND, no HSM, no palpable masses. Extremities: No LE edema.  Neuro: Alert and oriented. Nonfocal.  Skin: warm, dry, good turgor.  Psych: cooperative, appropriate mood and affect.  Pertinent Labs, Studies, and Procedures:     Latest Ref Rng & Units 10/24/2022    4:22 AM 10/23/2022    6:07 AM 10/20/2022    8:38 AM  CBC  WBC 4.0 - 10.5 K/uL 8.4  7.5  9.4   Hemoglobin 13.0 - 17.0 g/dL 16.1  09.6  04.5   Hematocrit 39.0 - 52.0 % 44.9  43.9  47.9   Platelets 150 - 400 K/uL 191  187  216        Latest Ref Rng & Units 10/24/2022    4:22 AM 10/23/2022    6:07 AM 10/20/2022    8:38 AM  CMP  Glucose 70 - 99 mg/dL 409  89  96   BUN 6 - 20 mg/dL 15  21  13    Creatinine 0.61 - 1.24 mg/dL 8.11  9.14  7.82   Sodium 135 - 145 mmol/L 136  141  144   Potassium 3.5 - 5.1 mmol/L 5.0  4.5  4.5   Chloride 98 - 111 mmol/L 106  110  110   CO2 22 - 32 mmol/L 23  21  19    Calcium 8.9 - 10.3 mg/dL 8.6  8.5  9.4   Total Protein 6.5 - 8.1 g/dL 5.6  5.6    Total Bilirubin 0.3 - 1.2 mg/dL 0.8  0.8    Alkaline Phos 38 - 126  U/L 69  68    AST 15 - 41 U/L 32  65    ALT 0 - 44 U/L 67  93      DG Chest Port 1 View  Result Date: 10/23/2022 CLINICAL DATA:  37 year old male with history of chest pain and shortness of breath. EXAM: PORTABLE CHEST 1 VIEW COMPARISON:  Chest x-ray 04/18/2022. FINDINGS: Lung volumes are slightly low. No definite consolidative airspace disease or pleural effusions. No pneumothorax. No evidence of pulmonary edema. Heart size is normal. Upper mediastinal contours are within normal limits. Status post median sternotomy for CABG including LIMA. Bilateral pacemaker devices are noted with lead tips projecting over the expected location of the right atrium and right ventricle. IMPRESSION: 1. Low lung volumes without radiographic evidence of acute cardiopulmonary disease. 2. Postoperative changes and support apparatus, as above. Electronically Signed   By: Trudie Reed M.D.   On: 10/23/2022 06:44     Discharge Instructions: Discharge Instructions     (HEART FAILURE PATIENTS) Call MD:  Anytime you have any of the following symptoms: 1) 3 pound weight gain in 24 hours or 5 pounds in 1 week 2) shortness of breath, with or without a dry hacking cough 3) swelling in the hands, feet or stomach 4) if you have to sleep on extra pillows at night in order to breathe.   Complete by: As directed    Call MD for:  difficulty breathing, headache or visual disturbances   Complete by: As directed    Call MD for:  persistant dizziness or light-headedness   Complete by: As directed    Call MD for:  persistant nausea and vomiting   Complete by: As directed    Call MD for:  redness, tenderness, or signs of infection (pain, swelling, redness, odor or green/yellow discharge around incision site)   Complete by: As directed    Call MD for:  severe uncontrolled pain   Complete by: As directed    Call MD for:  temperature >100.4   Complete by: As directed    Diet - low sodium heart healthy   Complete by: As directed     Discharge instructions   Complete by: As directed    Dear Mr. Vose,  It was a pleasure taking care of you at [facility]. You were admitted for chest pain and admitted for cardiac monitoring and observation. We are discharging you home now that you are doing better. Please follow the following instructions.   1) For your chest pain, we ruled out an acute cardiac cause after observing you overnight on cardiac monitor and continuing to watch your cardiac troponins. Please follow up with Wyatt Donovan at [facility] on 11/26/22 at 10AM. You have an Echocardiogram scheduled for 9AM on 11/26/22 at [facility]. We are discharging you with lidocaine patches for more chest pain.   2) For coronary artery disease status post coronary artery bypass graft, please continue to take Atorvastatin 80mg  daily and follow up with Wyatt Donovan  3) For your heart failure with reduced ejection fraction, please continue to take Coreg 3.125mg  twice a day, Farxiga once a day, Entresto 24-26mg  twice a day, and Spironolactone 25mg  once a day. Please get echocardiogram on 11/26/22 at 9AM and follow up with Wyatt Donovan in his clinic at 10AM.    4) For paroxysmal atrial fibrillation, please continue to take Eliquis 5mg  twice a day.   5) For your ICD, please continue to follow up with Dr. Lalla Brothers and get routine device checks. You have an appointment for your ICD on 11/10/22 at 7am at [facility].   6) For gastroesophageal reflux, history of GI bleed, history of  gastritis, and history of intermittent bleeding hemorrhoids, please continue to take Protonix 40mg  once a day. If reflux does not resolve with Protonix or if hemorrhoid bleeding worsens, please follow up with GI.   7) Please follow up with your primary care physician, Dr. Olena Leatherwood, within a week after hospital discharge.   8) If you have furthering chest pain that does not  resolved and associated shortness of breath, please return back to the Emergency department   Take care,  Lang Snow, OMS4  Dr. Modena Slater   Increase activity slowly   Complete by: As directed        Signed: Lang Snow, Medical Student 10/24/2022, 11:39 AM   Pager: [phone]  Attestation for Student Documentation:  I personally was present and re-performed the history, physical exam and medical decision-making activities of this service and have verified that the service and findings are accurately documented in the student's note.  Modena Slater, DO 10/24/2022, 11:40 AM

## 2022-10-24 NOTE — Progress Notes (Signed)
   Consult note from yesterday reviewed - chest pain thought to be non-cardiac in nature -  troponins remained flat at 22, 24, 39, 37. Has scheduled echo and follow-up with Dr. Shirlee Latch on 9/26 in the HF clinic. No further suggestions at this time.   [facility] will sign off.   Medication Recommendations:  none Other recommendations (labs, testing, etc):  none Follow up as an outpatient:  Dr. Yevette Edwards, MD, [facility], FACP  [place]  [facility]  Medical Director of the [facility]  [facility] Diplomate of the American Board of Clinical Lipidology Attending Cardiologist  Direct Dial: [phone]  Fax: [phone]  Website:  [email or web address]

## 2022-10-24 NOTE — Evaluation (Signed)
Occupational Therapy Evaluation Patient Details Name: Wyatt Donovan MRN: [id number] DOB: 10/13/1985 Today's Date: 10/24/2022   History of Present Illness Pt is a 37 y.o. male admitted 8/23 with chest pain. Testing revealed most likely noncardiac in nature (per cardiology). PMH: MI in 2012 at age 2, CAD s/p CABG in 2022, ICD placement in 2022, Afib/flutter, LV apical thrombus, ischemic cardiomyopathy, HFrEF (EF 35-40% 11/2020), history of GI bleed while on anticoagulation, HTN, and HLD   Clinical Impression   Patient lives at home with his father and reports that he is Independent in ADLs and his father completes IADLs.  Patient reports using cane for functional mobility at mod I level.  Patient is Independent in functional mobility and ADLs and no further OT intervention needed at this time.      If plan is discharge home, recommend the following: Assistance with cooking/housework;Assist for transportation    Functional Status Assessment  Patient has had a recent decline in their functional status and demonstrates the ability to make significant improvements in function in a reasonable and predictable amount of time.  Equipment Recommendations  None recommended by OT    Recommendations for Other Services       Precautions / Restrictions Precautions Precautions: None Restrictions Weight Bearing Restrictions: No      Mobility Bed Mobility Overal bed mobility: Independent                  Transfers Overall transfer level: Independent Equipment used: None                      Balance                                           ADL either performed or assessed with clinical judgement   ADL Overall ADL's : Independent                                             Vision Baseline Vision/History: 0 No visual deficits Patient Visual Report: No change from baseline       Perception         Praxis          Pertinent Vitals/Pain Pain Assessment Pain Assessment: No/denies pain     Extremity/Trunk Assessment Upper Extremity Assessment Upper Extremity Assessment: Overall WFL for tasks assessed   Lower Extremity Assessment Lower Extremity Assessment: Overall WFL for tasks assessed   Cervical / Trunk Assessment Cervical / Trunk Assessment: Normal   Communication Communication Communication: No apparent difficulties   Cognition Arousal: Alert Behavior During Therapy: WFL for tasks assessed/performed Overall Cognitive Status: Within Functional Limits for tasks assessed                                       General Comments  VSS on RA    Exercises     Shoulder Instructions      Home Living Family/patient expects to be discharged to:: Private residence Living Arrangements: Parent Available Help at Discharge: Family;Available 24 hours/day Type of Home: House Home Access: Stairs to enter   Entrance Stairs-Rails: Right Home Layout: One level     Bathroom Shower/Tub: Tub/shower  unit   Bathroom Toilet: Standard     Home Equipment: Cane - single point          Prior Functioning/Environment Prior Level of Function : Independent/Modified Independent             Mobility Comments: primarily ambulates household distances with/without cane ADLs Comments: mod I        OT Problem List:        OT Treatment/Interventions:      OT Goals(Current goals can be found in the care plan section) Acute Rehab OT Goals OT Goal Formulation: With patient Time For Goal Achievement: 10/31/22 Potential to Achieve Goals: Good  OT Frequency:      Co-evaluation              AM-PAC OT "6 Clicks" Daily Activity     Outcome Measure Help from another person eating meals?: None Help from another person taking care of personal grooming?: None Help from another person toileting, which includes using toliet, bedpan, or urinal?: None Help from another person  bathing (including washing, rinsing, drying)?: None Help from another person to put on and taking off regular upper body clothing?: None Help from another person to put on and taking off regular lower body clothing?: None 6 Click Score: 24   End of Session Nurse Communication: Mobility status  Activity Tolerance: Patient tolerated treatment well Patient left: in bed;with call bell/phone within reach  OT Visit Diagnosis: Unsteadiness on feet (R26.81);Muscle weakness (generalized) (M62.81)                Time: 1610-9604 OT Time Calculation (min): 8 min Charges:  OT General Charges $OT Visit: 1 Visit OT Evaluation $OT Eval Low Complexity: 1 Low  Governor Specking OT/L  Denice Paradise 10/24/2022, 2:17 PM

## 2022-10-24 NOTE — Evaluation (Signed)
Physical Therapy Evaluation Patient Details Name: Wyatt Donovan MRN: [id number] DOB: [birth year] Today's Date: 10/24/2022  History of Present Illness  Pt is a 37 y.o. male admitted 8/23 with chest pain. Testing revealed most likely noncardiac in nature (per cardiology). PMH: MI in 2012 at age 84, CAD s/p CABG in 2022, ICD placement in 2022, Afib/flutter, LV apical thrombus, ischemic cardiomyopathy, HFrEF (EF 35-40% 11/2020), history of GI bleed while on anticoagulation, HTN, and HLD   Clinical Impression  PT eval complete. Pt independent bed mobility and transfers. Supervision provided for hallway ambulation 150' without AD. Steady gait noted. VSS on RA. Pt at baseline for functional mobility. No further skilled PT intervention indicated. No DME needs. PT signing off.         If plan is discharge home, recommend the following:     Can travel by private vehicle        Equipment Recommendations None recommended by PT  Recommendations for Other Services       Functional Status Assessment Patient has not had a recent decline in their functional status     Precautions / Restrictions Precautions Precautions: None      Mobility  Bed Mobility Overal bed mobility: Independent                  Transfers Overall transfer level: Independent Equipment used: None                    Ambulation/Gait Ambulation/Gait assistance: Supervision Gait Distance (Feet): 150 Feet Assistive device: None Gait Pattern/deviations: Decreased stride length, Step-through pattern Gait velocity: decreased Gait velocity interpretation: 1.31 - 2.62 ft/sec, indicative of limited community ambulator   General Gait Details: steady Insurance risk surveyor     Tilt Bed    Modified Rankin (Stroke Patients Only)       Balance Overall balance assessment: Mild deficits observed, not formally tested                                            Pertinent Vitals/Pain Pain Assessment Pain Assessment: No/denies pain    Home Living Family/patient expects to be discharged to:: Private residence Living Arrangements: Parent (father) Available Help at Discharge: Family;Available 24 hours/day Type of Home: House Home Access: Stairs to enter Entrance Stairs-Rails: Right     Home Layout: One level Home Equipment: Cane - single point      Prior Function Prior Level of Function : Independent/Modified Independent             Mobility Comments: primarily ambulates household distances with/without cane ADLs Comments: mod I     Extremity/Trunk Assessment   Upper Extremity Assessment Upper Extremity Assessment: Overall WFL for tasks assessed    Lower Extremity Assessment Lower Extremity Assessment: Overall WFL for tasks assessed    Cervical / Trunk Assessment Cervical / Trunk Assessment: Normal  Communication   Communication Communication: No apparent difficulties  Cognition Arousal: Alert Behavior During Therapy: WFL for tasks assessed/performed Overall Cognitive Status: Within Functional Limits for tasks assessed                                          General Comments General comments (skin integrity,  edema, etc.): VSS on RA    Exercises     Assessment/Plan    PT Assessment Patient does not need any further PT services  PT Problem List         PT Treatment Interventions      PT Goals (Current goals can be found in the Care Plan section)  Acute Rehab PT Goals Patient Stated Goal: home PT Goal Formulation: All assessment and education complete, DC therapy    Frequency       Co-evaluation               AM-PAC PT "6 Clicks" Mobility  Outcome Measure Help needed turning from your back to your side while in a flat bed without using bedrails?: None Help needed moving from lying on your back to sitting on the side of a flat bed without using bedrails?: None Help needed  moving to and from a bed to a chair (including a wheelchair)?: None Help needed standing up from a chair using your arms (e.g., wheelchair or bedside chair)?: None Help needed to walk in hospital room?: A Little Help needed climbing 3-5 steps with a railing? : A Little 6 Click Score: 22    End of Session   Activity Tolerance: Patient tolerated treatment well Patient left: in bed;with call bell/phone within reach Nurse Communication: Mobility status PT Visit Diagnosis: Difficulty in walking, not elsewhere classified (R26.2)    Time: 6433-2951 PT Time Calculation (min) (ACUTE ONLY): 19 min   Charges:   PT Evaluation $PT Eval Low Complexity: 1 Low   PT General Charges $$ ACUTE PT VISIT: 1 Visit         Ferd Glassing., PT  Office # [phone]   Ilda Foil 10/24/2022, 11:10 AM

## 2022-10-24 NOTE — Progress Notes (Signed)
DISCHARGE NOTE HOME Stavros L Dorr to be discharged Home per MD order. Discussed prescriptions and follow up appointments with the patient. Prescriptions given to patient; medication list explained in detail. Patient verbalized understanding.  Skin clean, dry and intact without evidence of skin break down, no evidence of skin tears noted. IV catheter discontinued intact. Site without signs and symptoms of complications. Dressing and pressure applied. Pt denies pain at the site currently. No complaints noted.  Patient free of lines, drains, and wounds.   An After Visit Summary (AVS) was printed and given to the patient. Patient escorted via wheelchair, and discharged home via private auto.  Lorine Bears, RN

## 2022-10-24 NOTE — Discharge Instructions (Addendum)
Dear Mr. Antonovich,  It was a pleasure taking care of you at [facility]. You were admitted for chest pain and admitted for cardiac monitoring and observation. We are discharging you home now that you are doing better. Please follow the following instructions.   1) For your chest pain, we ruled out an acute cardiac cause after observing you overnight on cardiac monitor and continuing to watch your cardiac troponins. Please follow up with Dr. Shirlee Latch at [facility] on 11/26/22 at 10AM. You have an Echocardiogram scheduled for 9AM on 11/26/22 at [facility].   2) For coronary artery disease status post coronary artery bypass graft, please continue to take Atorvastatin 80mg  daily and follow up with Dr. Shirlee Latch  3) For your heart failure with reduced ejection fraction, please continue to take Coreg 3.125mg  twice a day, Farxiga once a day, Entresto 24-26mg  twice a day, and Spironolactone 25mg  once a day. Please get echocardiogram on 11/26/22 at 9AM and follow up with Dr. Shirlee Latch in his clinic at 10AM.    4) For paroxysmal atrial fibrillation, please continue to take Eliquis 5mg  twice a day.   5) For your ICD, please continue to follow up with Dr. Lalla Brothers and get routine device checks. You have an appointment for your ICD on 11/10/22 at 7am at [facility].   6) For gastroesophageal reflux, history of GI bleed, history of gastritis, and history of intermittent bleeding hemorrhoids, please continue to take Protonix 40mg  once a day. If reflux does not resolve with Protonix or if hemorrhoid bleeding worsens, please follow up with GI.   7) Please follow up with your primary care physician, Dr. Olena Leatherwood, within a week after hospital discharge.   Take care,  Lang Snow, OMS4  Dr. Modena Slater

## 2022-10-26 ENCOUNTER — Other Ambulatory Visit (HOSPITAL_COMMUNITY): Payer: Self-pay

## 2022-10-29 ENCOUNTER — Other Ambulatory Visit (HOSPITAL_COMMUNITY): Payer: Self-pay

## 2022-10-30 ENCOUNTER — Other Ambulatory Visit (HOSPITAL_COMMUNITY): Payer: Self-pay

## 2022-11-05 ENCOUNTER — Telehealth (HOSPITAL_COMMUNITY): Payer: Self-pay

## 2022-11-05 NOTE — Telephone Encounter (Signed)
Received a fax requesting medical records from [facility] [place]. Records were successfully faxed to: [phone] ,which was the number provided.. Medical request form will be scanned into patients chart per continuation of care.

## 2022-11-10 ENCOUNTER — Ambulatory Visit: Payer: Medicaid Other

## 2022-11-10 DIAGNOSIS — I255 Ischemic cardiomyopathy: Secondary | ICD-10-CM | POA: Diagnosis not present

## 2022-11-10 DIAGNOSIS — I5022 Chronic systolic (congestive) heart failure: Secondary | ICD-10-CM

## 2022-11-10 LAB — CUP PACEART REMOTE DEVICE CHECK
Battery Remaining Longevity: 150 mo
Battery Remaining Percentage: 100 %
Brady Statistic RA Percent Paced: 0 %
Brady Statistic RV Percent Paced: 0 %
Date Time Interrogation Session: 20240910064500
HighPow Impedance: 76 Ohm
Implantable Lead Connection Status: 753985
Implantable Lead Connection Status: 753985
Implantable Lead Implant Date: 20221213
Implantable Lead Implant Date: 20221213
Implantable Lead Location: 753859
Implantable Lead Location: 753860
Implantable Lead Model: 673
Implantable Lead Model: 7841
Implantable Lead Serial Number: 1211072
Implantable Lead Serial Number: 160080
Implantable Pulse Generator Implant Date: 20221213
Lead Channel Impedance Value: 519 Ohm
Lead Channel Impedance Value: 526 Ohm
Lead Channel Setting Pacing Amplitude: 2 V
Lead Channel Setting Pacing Amplitude: 2.5 V
Lead Channel Setting Pacing Pulse Width: 0.4 ms
Lead Channel Setting Sensing Sensitivity: 0.5 mV
Pulse Gen Serial Number: 617464
Zone Setting Status: 755011

## 2022-11-17 ENCOUNTER — Other Ambulatory Visit (HOSPITAL_COMMUNITY): Payer: Self-pay

## 2022-11-17 MED ORDER — SPIRONOLACTONE 25 MG PO TABS: 25.0000 mg | ORAL_TABLET | Freq: Every day | ORAL | 0 refills | Status: DC

## 2022-11-17 MED ORDER — ATORVASTATIN CALCIUM 80 MG PO TABS: 80.0000 mg | ORAL_TABLET | Freq: Every day | ORAL | 0 refills | Status: DC

## 2022-11-23 ENCOUNTER — Other Ambulatory Visit (HOSPITAL_COMMUNITY): Payer: Self-pay

## 2022-11-23 MED ORDER — ENTRESTO 24-26 MG PO TABS: 1.0000 | ORAL_TABLET | Freq: Two times a day (BID) | ORAL | 0 refills | Status: DC

## 2022-11-26 ENCOUNTER — Encounter (HOSPITAL_COMMUNITY): Payer: Medicaid Other | Admitting: Cardiology

## 2022-11-26 ENCOUNTER — Ambulatory Visit (HOSPITAL_COMMUNITY): Payer: Medicaid Other

## 2022-11-26 NOTE — Progress Notes (Signed)
Remote ICD transmission.   

## 2022-11-30 ENCOUNTER — Other Ambulatory Visit (HOSPITAL_COMMUNITY): Payer: Self-pay | Admitting: Cardiology

## 2022-11-30 ENCOUNTER — Other Ambulatory Visit (HOSPITAL_COMMUNITY): Payer: Self-pay

## 2022-11-30 MED ORDER — PANTOPRAZOLE SODIUM 40 MG PO TBEC: 40.0000 mg | DELAYED_RELEASE_TABLET | Freq: Every day | ORAL | 0 refills | Status: DC

## 2022-11-30 MED ORDER — ATORVASTATIN CALCIUM 80 MG PO TABS: 80.0000 mg | ORAL_TABLET | Freq: Every day | ORAL | 2 refills | Status: DC

## 2022-11-30 MED ORDER — SPIRONOLACTONE 25 MG PO TABS: 25.0000 mg | ORAL_TABLET | Freq: Every day | ORAL | 2 refills | Status: DC

## 2022-11-30 MED ORDER — DAPAGLIFLOZIN PROPANEDIOL 10 MG PO TABS: 10.0000 mg | ORAL_TABLET | Freq: Every day | ORAL | 2 refills | Status: DC

## 2022-12-02 ENCOUNTER — Other Ambulatory Visit (HOSPITAL_COMMUNITY): Payer: Self-pay

## 2022-12-02 MED ORDER — PANTOPRAZOLE SODIUM 40 MG PO TBEC: 40.0000 mg | DELAYED_RELEASE_TABLET | Freq: Every day | ORAL | 0 refills | Status: DC

## 2023-01-19 ENCOUNTER — Other Ambulatory Visit (HOSPITAL_COMMUNITY): Payer: Self-pay | Admitting: Cardiology

## 2023-01-19 DIAGNOSIS — I5022 Chronic systolic (congestive) heart failure: Secondary | ICD-10-CM

## 2023-02-09 ENCOUNTER — Ambulatory Visit (INDEPENDENT_AMBULATORY_CARE_PROVIDER_SITE_OTHER): Payer: Medicaid Other

## 2023-02-09 DIAGNOSIS — I255 Ischemic cardiomyopathy: Secondary | ICD-10-CM

## 2023-02-10 ENCOUNTER — Telehealth (HOSPITAL_COMMUNITY): Payer: Self-pay | Admitting: *Deleted

## 2023-02-10 LAB — CUP PACEART REMOTE DEVICE CHECK
Battery Remaining Longevity: 150 mo
Battery Remaining Percentage: 100 %
Brady Statistic RA Percent Paced: 0 %
Brady Statistic RV Percent Paced: 0 %
Date Time Interrogation Session: 20241210050200
HighPow Impedance: 76 Ohm
Implantable Lead Connection Status: 753985
Implantable Lead Connection Status: 753985
Implantable Lead Implant Date: 20221213
Implantable Lead Implant Date: 20221213
Implantable Lead Location: 753859
Implantable Lead Location: 753860
Implantable Lead Model: 673
Implantable Lead Model: 7841
Implantable Lead Serial Number: 1211072
Implantable Lead Serial Number: 160080
Implantable Pulse Generator Implant Date: 20221213
Lead Channel Impedance Value: 489 Ohm
Lead Channel Impedance Value: 532 Ohm
Lead Channel Setting Pacing Amplitude: 2 V
Lead Channel Setting Pacing Amplitude: 2.5 V
Lead Channel Setting Pacing Pulse Width: 0.4 ms
Lead Channel Setting Sensing Sensitivity: 0.5 mV
Pulse Gen Serial Number: 617464
Zone Setting Status: 755011

## 2023-02-10 NOTE — Telephone Encounter (Signed)
   No pre cert for echo

## 2023-02-15 ENCOUNTER — Telehealth (HOSPITAL_COMMUNITY): Payer: Self-pay | Admitting: Cardiology

## 2023-02-15 NOTE — Telephone Encounter (Signed)
Pt seen in ER over the weekend Was told he needs gallbladder surgery Was also told he needs cardiac clearance - anesthesia clearance  Advised all above can be discussed at upcoming appt

## 2023-02-17 ENCOUNTER — Other Ambulatory Visit (HOSPITAL_COMMUNITY): Payer: Self-pay

## 2023-02-17 ENCOUNTER — Ambulatory Visit (HOSPITAL_COMMUNITY)
Admission: RE | Admit: 2023-02-17 | Discharge: 2023-02-17 | Disposition: A | Discharge status: Home / Self Care | Payer: Medicaid Other | Source: Ambulatory Visit | Attending: Cardiology | Admitting: Cardiology

## 2023-02-17 ENCOUNTER — Encounter (HOSPITAL_COMMUNITY): Payer: Self-pay | Admitting: Cardiology

## 2023-02-17 ENCOUNTER — Ambulatory Visit (HOSPITAL_BASED_OUTPATIENT_CLINIC_OR_DEPARTMENT_OTHER)
Admission: RE | Admit: 2023-02-17 | Discharge: 2023-02-17 | Disposition: A | Discharge status: Home / Self Care | Payer: Medicaid Other | Source: Ambulatory Visit | Attending: Cardiology | Admitting: Cardiology

## 2023-02-17 VITALS — BP 90/50 | HR 72

## 2023-02-17 DIAGNOSIS — I4892 Unspecified atrial flutter: Secondary | ICD-10-CM | POA: Insufficient documentation

## 2023-02-17 DIAGNOSIS — I2510 Atherosclerotic heart disease of native coronary artery without angina pectoris: Secondary | ICD-10-CM | POA: Insufficient documentation

## 2023-02-17 DIAGNOSIS — Z7901 Long term (current) use of anticoagulants: Secondary | ICD-10-CM | POA: Insufficient documentation

## 2023-02-17 DIAGNOSIS — Z955 Presence of coronary angioplasty implant and graft: Secondary | ICD-10-CM | POA: Insufficient documentation

## 2023-02-17 DIAGNOSIS — I252 Old myocardial infarction: Secondary | ICD-10-CM | POA: Insufficient documentation

## 2023-02-17 DIAGNOSIS — Z006 Encounter for examination for normal comparison and control in clinical research program: Secondary | ICD-10-CM

## 2023-02-17 DIAGNOSIS — K625 Hemorrhage of anus and rectum: Secondary | ICD-10-CM | POA: Diagnosis not present

## 2023-02-17 DIAGNOSIS — E875 Hyperkalemia: Secondary | ICD-10-CM | POA: Diagnosis not present

## 2023-02-17 DIAGNOSIS — I5022 Chronic systolic (congestive) heart failure: Secondary | ICD-10-CM

## 2023-02-17 DIAGNOSIS — I219 Acute myocardial infarction, unspecified: Secondary | ICD-10-CM | POA: Insufficient documentation

## 2023-02-17 DIAGNOSIS — I255 Ischemic cardiomyopathy: Secondary | ICD-10-CM | POA: Diagnosis not present

## 2023-02-17 DIAGNOSIS — Z79899 Other long term (current) drug therapy: Secondary | ICD-10-CM | POA: Diagnosis not present

## 2023-02-17 DIAGNOSIS — K8010 Calculus of gallbladder with chronic cholecystitis without obstruction: Secondary | ICD-10-CM

## 2023-02-17 DIAGNOSIS — I959 Hypotension, unspecified: Secondary | ICD-10-CM | POA: Insufficient documentation

## 2023-02-17 DIAGNOSIS — Z87891 Personal history of nicotine dependence: Secondary | ICD-10-CM | POA: Insufficient documentation

## 2023-02-17 DIAGNOSIS — Z951 Presence of aortocoronary bypass graft: Secondary | ICD-10-CM | POA: Diagnosis not present

## 2023-02-17 LAB — CBC
HCT: 49.6 % (ref 39.0–52.0)
Hemoglobin: 15.8 g/dL (ref 13.0–17.0)
MCH: 29.8 pg (ref 26.0–34.0)
MCHC: 31.9 g/dL (ref 30.0–36.0)
MCV: 93.4 fL (ref 80.0–100.0)
Platelets: 241 10*3/uL (ref 150–400)
RBC: 5.31 MIL/uL (ref 4.22–5.81)
RDW: 13.6 % (ref 11.5–15.5)
WBC: 8.1 10*3/uL (ref 4.0–10.5)
nRBC: 0 % (ref 0.0–0.2)

## 2023-02-17 LAB — ECHOCARDIOGRAM COMPLETE
Area-P 1/2: 4.89 cm2
Calc EF: 53.1 %
S' Lateral: 5.2 cm
Single Plane A2C EF: 52.4 %
Single Plane A4C EF: 54.4 %

## 2023-02-17 LAB — BASIC METABOLIC PANEL
Anion gap: 11 (ref 5–15)
BUN: 14 mg/dL (ref 6–20)
CO2: 23 mmol/L (ref 22–32)
Calcium: 9.3 mg/dL (ref 8.9–10.3)
Chloride: 109 mmol/L (ref 98–111)
Creatinine, Ser: 0.95 mg/dL (ref 0.61–1.24)
GFR, Estimated: >60 mL/min (ref >60)
Glucose, Bld: 90 mg/dL (ref 70–99)
Potassium: 4.8 mmol/L (ref 3.5–5.1)
Sodium: 143 mmol/L (ref 135–145)

## 2023-02-17 LAB — BRAIN NATRIURETIC PEPTIDE: B Natriuretic Peptide: 106.2 pg/mL — ABNORMAL HIGH (ref 0.0–100.0)

## 2023-02-17 MED ORDER — LOKELMA 5 G PO PACK: 5.0000 g | PACK | Freq: Every day | ORAL | 6 refills | Status: DC

## 2023-02-17 MED ORDER — PERFLUTREN LIPID MICROSPHERE
1.0000 mL | INTRAVENOUS | Status: DC | PRN
  Administered 2023-02-17: 3 mL via INTRAVENOUS
  Filled 2023-02-17: qty 10

## 2023-02-17 NOTE — Progress Notes (Signed)
Orders entered for right and left heart cath scheduled for Friday 12/20 with Dr. Shirlee Latch

## 2023-02-17 NOTE — Progress Notes (Signed)
Advanced Heart Failure Clinic Note   Primary Care: Toma Deiters, MD HF Cardiologist: Dr. Shirlee Latch EP: Dr Lalla Brothers   HPI: Patient is a 37 y.o. with history of early onset CAD s/p CABG, ischemic cardiomyopathy, and LV thrombus. Patient had initial MI in 2012 at age 56, PCI to LAD.  He had inferoposterior MI in 4/22.  LHC showed 3 vessel disease, and patient had CABG x 4. Cardiac MRI in 5/22 showed LV EF 27% with LV thrombus, there was significant viability.  Post-op, patient had GI bleeding and anticoagulation was stopped.  He quit smoking after CABG.    Follow up 10/04/20 carvedilol decreased due to dizziness and repeat echo arranged.  Echo 8/22 EF 25-30%. He was referred to EP for ICD consideration => [employer] ICD.   Admitted 12/03/20 with NSTEMI. Coronary angiogram showed patent LIMA to LAD with occlusion of all other grafts. S/p PCI/DES to native mid LCX 99% stenosis.  Native LAD and RCA occluded. Angina resolved post PCI. Farxiga and Coreg added back, however spiro and losartan held due to hyperkalemia during this admission. His hospitalization was complicated by atrial fibrillation and flutter with RVR. He received IV amiodarone and started on Eliquis.  He underwent DCCV with conversion to NSR on 12/06/20. Discharged home 12/06/20, weight 177.5 lbs.  In 2/24, patient had cardiac contracility modulator placement.   Patient presented to [facility] in 12/24 with 2-3 months of RUQ pain, worse x 2-3 weeks.  Abdominal CT showed a stone in the cystic duct with mildly distended gallbladder but no evidence for cholecystitis.  He was discharged from the ER but told that he will need cholecystectomy.   Echo was done today and reviewed, EF 30-35%, global hypokinesis, mild RV dysfunction, IVC not dilated.   Patient returns today for followup of CHF.  He has not been seen in this office for about a year, had difficulty with transportation.  He continues to have RUQ pain that waxes and  wanes, seems worse with eating.  However, he is also quite symptomatic from a cardiac perspective.  He reports episodes of left-sided chest pain and pressure.  Sometimes he gets chest pain just walking to the bathroom but usually takes more exertion than this.  Generally, chest pain episodes are mild and do not require NTG use.  He says he uses NTG 2-3 times/month for the last 5-6 months.  He is profoundly fatigued, has no energy to do anything he wants to do (even to play video games).  He is short of breath walking < 1/2 block, with any incline.  No orthopnea.  Feels like he has been declining for several months.   [employer] ICD: HeartLogic 15  Labs (10/22): K 4.5, creatinine 0.95, LDL 45 Labs (3/23): K 4.2, creatinine 0.94 Labs (5/23): K 4.5, creatinine 1.19 Labs (6/23): K 4.5, creatinine 1.03, LDL 60, TSH 0.430, HFTs nl  Labs (8/23): K 4.1, creatinine 1.08 Labs (11/23): K 4.6, creatinine 1.13 Labs (12/24): K 5.7, creatinine 1.04, hgb 15.8, HS-TnI 32  PMH: 1. Hyperlipidemia 2. CAD: MI at age 1 in 2012, PCI to LAD.   - Inferoposterior MI in 4/22: LHC showed 3 vessel disease.  CABG with LIMA-LAD, radial to OM1, sequential SVG-PDA and D1.  - NSTEMI-->LHC (10/22): patent LIMA to LAD, all other grafts occluded. S/p PCI DES native mid LCx 99% stenosis, LAD and RCA totally occluded.  3. Post-operative GI bleeding after CABG 4. Chronic systolic CHF: Ischemic cardiomyopathy.   - Cardiac  MRI (5/22): LV EF 27%, RV EF 57%, apical thrombus, extensive viability.  - Echo (8/22) EF 20-25% - Echo (10/22): EF 35-40% - cMRI (10/22): LVEF 29%, RV EF 45%, no LV thrombus, LGE suggestive of prior MI.  - [employer] ICD  - Cardiac contractility modulator implantation in 2/24.  - Echo (12/24): EF 30-35%, global hypokinesis, mild RV dysfunction, IVC not dilated. 5. LV thrombus  - No thrombus on 10/22 cMRI.  6. Prior smoker: Quit 5/22.  7. H/o hyperkalemia 8. Atrial fibrillation/flutter:  Paroxysmal.  - DCCV 10/22 9. ABIs (5/22): Normal.  10. Colonic polyps 11. Symptomatic cholelithiasis  Current Outpatient Medications  Medication Sig Dispense Refill   apixaban (ELIQUIS) 5 MG TABS tablet Take 1 tablet (5 mg total) by mouth 2 (two) times daily.     atorvastatin (LIPITOR) 80 MG tablet Take 1 tablet (80 mg total) by mouth daily. 30 tablet 2   carvedilol (COREG) 3.125 MG tablet TAKE ONE TABLET BY MOUTH TWICE DAILY WITH MEALS 60 tablet 6   dapagliflozin propanediol (FARXIGA) 10 MG TABS tablet Take 1 tablet (10 mg total) by mouth daily before breakfast. 30 tablet 2   linaclotide (LINZESS) 290 MCG CAPS capsule Take 1 capsule (290 mcg total) by mouth daily before breakfast. 30 capsule 5   nitroGLYCERIN (NITROSTAT) 0.4 MG SL tablet Place 1 tablet (0.4 mg total) under the tongue every 5 (five) minutes x 3 doses as needed for chest pain. 15 tablet 3   ondansetron (ZOFRAN-ODT) 4 MG disintegrating tablet Take 4 mg by mouth as needed for nausea or vomiting.     oxyCODONE-acetaminophen (PERCOCET/ROXICET) 5-325 MG tablet Take 1 tablet by mouth as needed for severe pain (pain score 7-10).     pantoprazole (PROTONIX) 40 MG tablet Take 1 tablet (40 mg total) by mouth daily. 30 tablet 0   sacubitril-valsartan (ENTRESTO) 24-26 MG Take 1 tablet by mouth 2 (two) times daily. 60 tablet 0   spironolactone (ALDACTONE) 25 MG tablet Take 1 tablet (25 mg total) by mouth daily. 30 tablet 2   sodium zirconium cyclosilicate (LOKELMA) 5 g packet Take 5 g by mouth daily. 30 packet 6   No current facility-administered medications for this encounter.   Allergies  Allergen Reactions   Codeine Itching   Social History   Socioeconomic History   Marital status: Single    Spouse name: Not on file   Number of children: Not on file   Years of education: Not on file   Highest education level: High school graduate  Occupational History   Occupation: Not actively working    Comment: applied for disability x  2.   Tobacco Use   Smoking status: Former    Current packs/day: 0.00    Average packs/day: 1 pack/day for 22.0 years (22.0 ttl pk-yrs)    Types: Cigarettes, Cigars    Start date: 06/29/1998    Quit date: 06/28/2020    Years since quitting: 2.6   Smokeless tobacco: Never   Tobacco comments:    cigarettes stopped in 2020, [employer] sweets started in 2018-04-05/day  Vaping Use   Vaping status: Never Used  Substance and Sexual Activity   Alcohol use: Never   Drug use: Not Currently    Types: Marijuana   Sexual activity: Never  Other Topics Concern   Not on file  Social History Narrative   Single   Lives with his parents   Trying to get his GED   DeGent date all cultures   Social Drivers  of Health   Financial Resource Strain: Medium Risk (12/04/2020)   Overall Financial Resource Strain (CARDIA)    Difficulty of Paying Living Expenses: Somewhat hard  Food Insecurity: No Food Insecurity (12/04/2020)   Hunger Vital Sign    Worried About Running Out of Food in the Last Year: Never true    Ran Out of Food in the Last Year: Never true  Transportation Needs: No Transportation Needs (12/04/2020)   PRAPARE - Administrator, Civil Service (Medical): No    Lack of Transportation (Non-Medical): No  Physical Activity: Not on file  Stress: Not on file  Social Connections: Not on file  Intimate Partner Violence: Not on file   Family History  Problem Relation Age of Onset   Colon cancer Father 73   Colon cancer Paternal Grandfather    Colon cancer Paternal Aunt    Hypertension Neg Hx    Diabetes Neg Hx    Coronary artery disease Neg Hx    Esophageal cancer Neg Hx    Rectal cancer Neg Hx    Stomach cancer Neg Hx    BP (!) 90/50   Pulse 72   SpO2 96%   Wt Readings from Last 3 Encounters:  10/23/22 92.5 kg (203 lb 14.8 oz)  10/20/22 92.3 kg (203 lb 6.4 oz)  08/04/22 93.8 kg (206 lb 12.8 oz)   PHYSICAL EXAM: General: NAD Neck: JVP 7-8 cm, no thyromegaly or thyroid nodule.   Lungs: Clear to auscultation bilaterally with normal respiratory effort. CV: Nondisplaced PMI.  Heart regular S1/S2, no S3/S4, no murmur.  No peripheral edema.  No carotid bruit.  Normal pedal pulses.  Abdomen: Soft, +RUQ tenderness without rebound/guarding, no hepatosplenomegaly, no distention.  Skin: Intact without lesions or rashes.  Neurologic: Alert and oriented x 3.  Psych: Normal affect. Extremities: No clubbing or cyanosis.  HEENT: Normal.   ASSESSMENT & PLAN:  1. Symptomatic cholelithiasis: Patient has had symptomatic cholelithiasis for several months, worse for several weeks.  Recently seen in the ER at [facility], CT abdomen showed obstructing stone in the cystic duct with mildly distended gallbladder but no evidence for cholecystitis.  He was discharged from the ER but told that he will need cholecystectomy.  Patient remains symptomatic, no changes.  He is tender to palpation of RUQ, no peritoneal signs and no fever.  Situation is complicated by possible unstable cardiac symptoms, he has exertional chest pain that seems fairly frequent, also has NYHA class IIIb dyspnea.  Echo today was somewhat reassuring, EF remains 30-35% which is mildly better than on last study (cardiac MRI in 10/22).  - He needs urgent referral to [facility], will make the referral.  - He is going to need right and left heart catheterization for risk stratification and hemodynamic optimization prior to considering cholecystectomy.  I will try to get this done urgently on Friday of this week prior to him seeing the surgeon.  I discussed risks/benefits with patient and he agrees to procedure.  He will hold Eliquis the day before and the day of procedure.  2. CAD: Early onset CAD s/p MI with LAD PCI in 2012,followed by CABG  x 4 4/22  Admit 10/22 with NSTEMI. LHC with patent LIMA to LAD, all other grafts occluded. S/p PCI DES native mid LCx 99% stenosis. For several months, he has been having episodes  of chest pain, typically exertional.  Sometimes he will get chest tightness just walking to the bathroom but not usually that  bad.  Symptoms generally are mild but he has been taking 2-3 NTGs/month.   - He is now off Plavix (stopped 8/23 due to BRBPR). - He is on Eliquis so no ASA.   - Continue atorvastatin 80 mg daily, check lipids.  - I will arrange for coronary angiography as above given worsening angina and need for cholecystectomy.  Needs risk stratification pre-surgery.  3. Chronic Systolic CHF: Ischemic cardiomyopathy.  [employer] ICD.  Cardiac MRI in 10/22 showed LVEF 29% and RVEF 45%.  Patient has a cardiac contractility modulator.  Echo was done today and reviewed, showing EF 30-35%, global hypokinesis, mild RV dysfunction, IVC not dilated.  GDMT has been limited by hypotension & hyperkalemia, K is now controlled with Lokelma.  K was recently elevated at 5.7 but he had been off Lokelma.  NYHA class IIIb symptoms, worse recently, with prominent fatigue.  Not markedly volume overloaded on exam, but weight is up and HeartLogic is up to 15.  - Start Lasix 20 mg daily.  BMET/BNP today, BMET in 10 days.  - Continue Coreg 3.125 mg bid (he did not tolerate increase due to low BP). - Continue Farxiga 10 mg daily.  - Continue spironolactone 25 mg daily + Lokelma 5 g daily given history of hyperkalemia.  - Continue Entresto 24/26 bid.  - RHC as above, assess filling pressures and look for low output pre-CCY.  4. 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  No thrombus on echo 08/22. No thrombus on cMRI 12/06/20.  No thrombus on today's echo.  - Continue Eliquis 5 mg bid, hold pre-cath. 5. Hyperkalemia: He has had a tendency towards hyperkalemia and has been on Lokelma in order to stay on spironolactone and Entresto.  K was recently 5.7 but he had been off Lokelma for a couple of days but is now back on it.  - BMET today.  - Continue Lokelma 5 g daily.  6. Atrial flutter/fibrillation: Both arrhythmias  noted in 10/22.  He tolerated poorly with worsened symptoms.  He had DCCV in 10/22.  He is maintaining NSR on amiodarone, but amiodarone not ideal in a patient this young.  He saw Dr. Lalla Brothers who wanted to see clinical stability prior to atrial fibrillation ablation. No symptomatic AF.  - He is now off amiodarone and maintaining NSR, leave off for now.  - Continue Eliquis.  - Continue to follow with EP, ablation if he has recurrent AF.  6. Former Smoker: Last cigarette 06/30/20.  Followup with APP 2-3 weeks.   Marca Ancona,  02/17/23

## 2023-02-17 NOTE — Research (Signed)
SITE: [id number]     Subject # [id number]    Subprotocol: A  Inclusion Criteria  Patients who meet all of the following criteria are eligible for enrollment as study participants:  Yes No  Age > 39 years old X   Eligible to wear Holter Study X    Exclusion Criteria  Patients who meet any of these criteria are not eligible for enrollment as study participants: Yes No  1. Receiving any mechanical (respiratory or circulatory) or renal support therapy at Screening or during Visit #1.  X  2.  Any other conditions that in the opinion of the investigators are likely to prevent compliance with the study protocol or pose a safety concern if the subject participates in the study.  X  3. Poor tolerance, namely susceptible to severe skin allergies from ECG adhesive patch application.  X   Protocol: REV H                                     Residential Zip code [address] (First 3 digits ONLY)                                             [facility] Informed Consent   Subject Name: Wyatt Donovan  Subject met inclusion and exclusion criteria.  The informed consent form, study requirements and expectations were reviewed with the subject. Subject had opportunity to read consent and questions and concerns were addressed prior to the signing of the consent form.  The subject verbalized understanding of the trial requirements.  The subject agreed to participate in the [facility] EF ACT trial and signed the informed consent at 11:19 on 17-Feb-2023.  The informed consent was obtained prior to performance of any protocol-specific procedures for the subject.  A copy of the signed informed consent was given to the subject and a copy was placed in the subject's medical record.   Dyanne Iha          Current Outpatient Medications:    apixaban (ELIQUIS) 5 MG TABS tablet, Take 1 tablet (5 mg total) by mouth 2 (two) times daily., Disp: , Rfl:    atorvastatin (LIPITOR) 80 MG tablet, Take 1 tablet (80 mg total) by mouth daily.,  Disp: 30 tablet, Rfl: 2   carvedilol (COREG) 3.125 MG tablet, TAKE ONE TABLET BY MOUTH TWICE DAILY WITH MEALS, Disp: 60 tablet, Rfl: 6   dapagliflozin propanediol (FARXIGA) 10 MG TABS tablet, Take 1 tablet (10 mg total) by mouth daily before breakfast., Disp: 30 tablet, Rfl: 2   linaclotide (LINZESS) 290 MCG CAPS capsule, Take 1 capsule (290 mcg total) by mouth daily before breakfast., Disp: 30 capsule, Rfl: 5   nitroGLYCERIN (NITROSTAT) 0.4 MG SL tablet, Place 1 tablet (0.4 mg total) under the tongue every 5 (five) minutes x 3 doses as needed for chest pain., Disp: 15 tablet, Rfl: 3   ondansetron (ZOFRAN-ODT) 4 MG disintegrating tablet, Take 4 mg by mouth as needed for nausea or vomiting., Disp: , Rfl:    oxyCODONE-acetaminophen (PERCOCET/ROXICET) 5-325 MG tablet, Take 1 tablet by mouth as needed for severe pain (pain score 7-10)., Disp: , Rfl:    pantoprazole (PROTONIX) 40 MG tablet, Take 1 tablet (40 mg total) by mouth daily., Disp: 30 tablet, Rfl: 0   sacubitril-valsartan (ENTRESTO)  24-26 MG, Take 1 tablet by mouth 2 (two) times daily., Disp: 60 tablet, Rfl: 0   sodium zirconium cyclosilicate (LOKELMA) 5 g packet, Take 5 g by mouth daily., Disp: 30 packet, Rfl: 6   spironolactone (ALDACTONE) 25 MG tablet, Take 1 tablet (25 mg total) by mouth daily., Disp: 30 tablet, Rfl: 2

## 2023-02-17 NOTE — H&P (View-Only) (Signed)
 Advanced Heart Failure Clinic Note   Primary Care: Toma Deiters, MD HF Cardiologist: Dr. Shirlee Latch EP: Dr Lalla Brothers   HPI: Patient is a 37 y.o. with history of early onset CAD s/p CABG, ischemic cardiomyopathy, and LV thrombus. Patient had initial MI in 2012 at age 56, PCI to LAD.  He had inferoposterior MI in 4/22.  LHC showed 3 vessel disease, and patient had CABG x 4. Cardiac MRI in 5/22 showed LV EF 27% with LV thrombus, there was significant viability.  Post-op, patient had GI bleeding and anticoagulation was stopped.  He quit smoking after CABG.    Follow up 10/04/20 carvedilol decreased due to dizziness and repeat echo arranged.  Echo 8/22 EF 25-30%. He was referred to EP for ICD consideration => [employer] ICD.   Admitted 12/03/20 with NSTEMI. Coronary angiogram showed patent LIMA to LAD with occlusion of all other grafts. S/p PCI/DES to native mid LCX 99% stenosis.  Native LAD and RCA occluded. Angina resolved post PCI. Farxiga and Coreg added back, however spiro and losartan held due to hyperkalemia during this admission. His hospitalization was complicated by atrial fibrillation and flutter with RVR. He received IV amiodarone and started on Eliquis.  He underwent DCCV with conversion to NSR on 12/06/20. Discharged home 12/06/20, weight 177.5 lbs.  In 2/24, patient had cardiac contracility modulator placement.   Patient presented to [facility] in 12/24 with 2-3 months of RUQ pain, worse x 2-3 weeks.  Abdominal CT showed a stone in the cystic duct with mildly distended gallbladder but no evidence for cholecystitis.  He was discharged from the ER but told that he will need cholecystectomy.   Echo was done today and reviewed, EF 30-35%, global hypokinesis, mild RV dysfunction, IVC not dilated.   Patient returns today for followup of CHF.  He has not been seen in this office for about a year, had difficulty with transportation.  He continues to have RUQ pain that waxes and  wanes, seems worse with eating.  However, he is also quite symptomatic from a cardiac perspective.  He reports episodes of left-sided chest pain and pressure.  Sometimes he gets chest pain just walking to the bathroom but usually takes more exertion than this.  Generally, chest pain episodes are mild and do not require NTG use.  He says he uses NTG 2-3 times/month for the last 5-6 months.  He is profoundly fatigued, has no energy to do anything he wants to do (even to play video games).  He is short of breath walking < 1/2 block, with any incline.  No orthopnea.  Feels like he has been declining for several months.   [employer] ICD: HeartLogic 15  Labs (10/22): K 4.5, creatinine 0.95, LDL 45 Labs (3/23): K 4.2, creatinine 0.94 Labs (5/23): K 4.5, creatinine 1.19 Labs (6/23): K 4.5, creatinine 1.03, LDL 60, TSH 0.430, HFTs nl  Labs (8/23): K 4.1, creatinine 1.08 Labs (11/23): K 4.6, creatinine 1.13 Labs (12/24): K 5.7, creatinine 1.04, hgb 15.8, HS-TnI 32  PMH: 1. Hyperlipidemia 2. CAD: MI at age 1 in 2012, PCI to LAD.   - Inferoposterior MI in 4/22: LHC showed 3 vessel disease.  CABG with LIMA-LAD, radial to OM1, sequential SVG-PDA and D1.  - NSTEMI-->LHC (10/22): patent LIMA to LAD, all other grafts occluded. S/p PCI DES native mid LCx 99% stenosis, LAD and RCA totally occluded.  3. Post-operative GI bleeding after CABG 4. Chronic systolic CHF: Ischemic cardiomyopathy.   - Cardiac  MRI (5/22): LV EF 27%, RV EF 57%, apical thrombus, extensive viability.  - Echo (8/22) EF 20-25% - Echo (10/22): EF 35-40% - cMRI (10/22): LVEF 29%, RV EF 45%, no LV thrombus, LGE suggestive of prior MI.  - [employer] ICD  - Cardiac contractility modulator implantation in 2/24.  - Echo (12/24): EF 30-35%, global hypokinesis, mild RV dysfunction, IVC not dilated. 5. LV thrombus  - No thrombus on 10/22 cMRI.  6. Prior smoker: Quit 5/22.  7. H/o hyperkalemia 8. Atrial fibrillation/flutter:  Paroxysmal.  - DCCV 10/22 9. ABIs (5/22): Normal.  10. Colonic polyps 11. Symptomatic cholelithiasis  Current Outpatient Medications  Medication Sig Dispense Refill   apixaban (ELIQUIS) 5 MG TABS tablet Take 1 tablet (5 mg total) by mouth 2 (two) times daily.     atorvastatin (LIPITOR) 80 MG tablet Take 1 tablet (80 mg total) by mouth daily. 30 tablet 2   carvedilol (COREG) 3.125 MG tablet TAKE ONE TABLET BY MOUTH TWICE DAILY WITH MEALS 60 tablet 6   dapagliflozin propanediol (FARXIGA) 10 MG TABS tablet Take 1 tablet (10 mg total) by mouth daily before breakfast. 30 tablet 2   linaclotide (LINZESS) 290 MCG CAPS capsule Take 1 capsule (290 mcg total) by mouth daily before breakfast. 30 capsule 5   nitroGLYCERIN (NITROSTAT) 0.4 MG SL tablet Place 1 tablet (0.4 mg total) under the tongue every 5 (five) minutes x 3 doses as needed for chest pain. 15 tablet 3   ondansetron (ZOFRAN-ODT) 4 MG disintegrating tablet Take 4 mg by mouth as needed for nausea or vomiting.     oxyCODONE-acetaminophen (PERCOCET/ROXICET) 5-325 MG tablet Take 1 tablet by mouth as needed for severe pain (pain score 7-10).     pantoprazole (PROTONIX) 40 MG tablet Take 1 tablet (40 mg total) by mouth daily. 30 tablet 0   sacubitril-valsartan (ENTRESTO) 24-26 MG Take 1 tablet by mouth 2 (two) times daily. 60 tablet 0   spironolactone (ALDACTONE) 25 MG tablet Take 1 tablet (25 mg total) by mouth daily. 30 tablet 2   sodium zirconium cyclosilicate (LOKELMA) 5 g packet Take 5 g by mouth daily. 30 packet 6   No current facility-administered medications for this encounter.   Allergies  Allergen Reactions   Codeine Itching   Social History   Socioeconomic History   Marital status: Single    Spouse name: Not on file   Number of children: Not on file   Years of education: Not on file   Highest education level: High school graduate  Occupational History   Occupation: Not actively working    Comment: applied for disability x  2.   Tobacco Use   Smoking status: Former    Current packs/day: 0.00    Average packs/day: 1 pack/day for 22.0 years (22.0 ttl pk-yrs)    Types: Cigarettes, Cigars    Start date: 06/29/1998    Quit date: 06/28/2020    Years since quitting: 2.6   Smokeless tobacco: Never   Tobacco comments:    cigarettes stopped in 2020, [employer] sweets started in 2018-04-05/day  Vaping Use   Vaping status: Never Used  Substance and Sexual Activity   Alcohol use: Never   Drug use: Not Currently    Types: Marijuana   Sexual activity: Never  Other Topics Concern   Not on file  Social History Narrative   Single   Lives with his parents   Trying to get his GED   DeGent date all cultures   Social Drivers  of Health   Financial Resource Strain: Medium Risk (12/04/2020)   Overall Financial Resource Strain (CARDIA)    Difficulty of Paying Living Expenses: Somewhat hard  Food Insecurity: No Food Insecurity (12/04/2020)   Hunger Vital Sign    Worried About Running Out of Food in the Last Year: Never true    Ran Out of Food in the Last Year: Never true  Transportation Needs: No Transportation Needs (12/04/2020)   PRAPARE - Administrator, Civil Service (Medical): No    Lack of Transportation (Non-Medical): No  Physical Activity: Not on file  Stress: Not on file  Social Connections: Not on file  Intimate Partner Violence: Not on file   Family History  Problem Relation Age of Onset   Colon cancer Father 73   Colon cancer Paternal Grandfather    Colon cancer Paternal Aunt    Hypertension Neg Hx    Diabetes Neg Hx    Coronary artery disease Neg Hx    Esophageal cancer Neg Hx    Rectal cancer Neg Hx    Stomach cancer Neg Hx    BP (!) 90/50   Pulse 72   SpO2 96%   Wt Readings from Last 3 Encounters:  10/23/22 92.5 kg (203 lb 14.8 oz)  10/20/22 92.3 kg (203 lb 6.4 oz)  08/04/22 93.8 kg (206 lb 12.8 oz)   PHYSICAL EXAM: General: NAD Neck: JVP 7-8 cm, no thyromegaly or thyroid nodule.   Lungs: Clear to auscultation bilaterally with normal respiratory effort. CV: Nondisplaced PMI.  Heart regular S1/S2, no S3/S4, no murmur.  No peripheral edema.  No carotid bruit.  Normal pedal pulses.  Abdomen: Soft, +RUQ tenderness without rebound/guarding, no hepatosplenomegaly, no distention.  Skin: Intact without lesions or rashes.  Neurologic: Alert and oriented x 3.  Psych: Normal affect. Extremities: No clubbing or cyanosis.  HEENT: Normal.   ASSESSMENT & PLAN:  1. Symptomatic cholelithiasis: Patient has had symptomatic cholelithiasis for several months, worse for several weeks.  Recently seen in the ER at [facility], CT abdomen showed obstructing stone in the cystic duct with mildly distended gallbladder but no evidence for cholecystitis.  He was discharged from the ER but told that he will need cholecystectomy.  Patient remains symptomatic, no changes.  He is tender to palpation of RUQ, no peritoneal signs and no fever.  Situation is complicated by possible unstable cardiac symptoms, he has exertional chest pain that seems fairly frequent, also has NYHA class IIIb dyspnea.  Echo today was somewhat reassuring, EF remains 30-35% which is mildly better than on last study (cardiac MRI in 10/22).  - He needs urgent referral to [facility], will make the referral.  - He is going to need right and left heart catheterization for risk stratification and hemodynamic optimization prior to considering cholecystectomy.  I will try to get this done urgently on Friday of this week prior to him seeing the surgeon.  I discussed risks/benefits with patient and he agrees to procedure.  He will hold Eliquis the day before and the day of procedure.  2. CAD: Early onset CAD s/p MI with LAD PCI in 2012,followed by CABG  x 4 4/22  Admit 10/22 with NSTEMI. LHC with patent LIMA to LAD, all other grafts occluded. S/p PCI DES native mid LCx 99% stenosis. For several months, he has been having episodes  of chest pain, typically exertional.  Sometimes he will get chest tightness just walking to the bathroom but not usually that  bad.  Symptoms generally are mild but he has been taking 2-3 NTGs/month.   - He is now off Plavix (stopped 8/23 due to BRBPR). - He is on Eliquis so no ASA.   - Continue atorvastatin 80 mg daily, check lipids.  - I will arrange for coronary angiography as above given worsening angina and need for cholecystectomy.  Needs risk stratification pre-surgery.  3. Chronic Systolic CHF: Ischemic cardiomyopathy.  [employer] ICD.  Cardiac MRI in 10/22 showed LVEF 29% and RVEF 45%.  Patient has a cardiac contractility modulator.  Echo was done today and reviewed, showing EF 30-35%, global hypokinesis, mild RV dysfunction, IVC not dilated.  GDMT has been limited by hypotension & hyperkalemia, K is now controlled with Lokelma.  K was recently elevated at 5.7 but he had been off Lokelma.  NYHA class IIIb symptoms, worse recently, with prominent fatigue.  Not markedly volume overloaded on exam, but weight is up and HeartLogic is up to 15.  - Start Lasix 20 mg daily.  BMET/BNP today, BMET in 10 days.  - Continue Coreg 3.125 mg bid (he did not tolerate increase due to low BP). - Continue Farxiga 10 mg daily.  - Continue spironolactone 25 mg daily + Lokelma 5 g daily given history of hyperkalemia.  - Continue Entresto 24/26 bid.  - RHC as above, assess filling pressures and look for low output pre-CCY.  4. 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  No thrombus on echo 08/22. No thrombus on cMRI 12/06/20.  No thrombus on today's echo.  - Continue Eliquis 5 mg bid, hold pre-cath. 5. Hyperkalemia: He has had a tendency towards hyperkalemia and has been on Lokelma in order to stay on spironolactone and Entresto.  K was recently 5.7 but he had been off Lokelma for a couple of days but is now back on it.  - BMET today.  - Continue Lokelma 5 g daily.  6. Atrial flutter/fibrillation: Both arrhythmias  noted in 10/22.  He tolerated poorly with worsened symptoms.  He had DCCV in 10/22.  He is maintaining NSR on amiodarone, but amiodarone not ideal in a patient this young.  He saw Dr. Lalla Brothers who wanted to see clinical stability prior to atrial fibrillation ablation. No symptomatic AF.  - He is now off amiodarone and maintaining NSR, leave off for now.  - Continue Eliquis.  - Continue to follow with EP, ablation if he has recurrent AF.  6. Former Smoker: Last cigarette 06/30/20.  Followup with APP 2-3 weeks.   Marca Ancona,  02/17/23

## 2023-02-17 NOTE — Patient Instructions (Signed)
Medication Changes:  START: LASIX (FUROSEMIDE) 20MG  ONCE DAILY   TAKE: LOKELMA 5G DAILY   Lab Work:  Labs done today, your results will be available in MyChart, we will contact you for abnormal readings.  THEN AGAIN IN 10 DAYS AS SCHEDULED   Referrals:  YOU HAVE BEEN REFERRED TO [facility] THEY WILL REACH OUT TO YOU OR CALL TO ARRANGE THIS. PLEASE CALL us WITH ANY CONCERNS   Special Instructions // Education:   [name] [address] [place] [address] Dept: [phone] Loc: [phone]  Wyatt Donovan  02/17/2023  You are scheduled for a Cardiac Catheterization on Friday, December 20 with Dr. Marca Ancona.  1. Please arrive at the [facility] (Main Entrance A) at [facility]: [address] [place], [place] [address] at 5:30 AM (This time is 2 hour(s) before your procedure to ensure your preparation).   Free valet parking service is available. You will check in at ADMITTING. The support person will be asked to wait in the waiting room.  It is OK to have someone drop you off and come back when you are ready to be discharged.    Special note: Every effort is made to have your procedure done on time. Please understand that emergencies sometimes delay scheduled procedures.  2. Diet: Do not eat solid foods after midnight.  The patient may have clear liquids until 5am upon the day of the procedure.  3. Labs: You will need to have blood drawn on TODAY   4. Medication instructions in preparation for your procedure:   Contrast Allergy: No  STOP TAKING ELIQUIS TOMORROW- DO NOT TAKE TOMORROW OR THE MORNING OF PROCEDURE   DO NOT TAKE LASIX (FUROSEMIDE) THE MORNING OF PROCEDURE   5. Plan to go home the same day, you will only stay overnight if medically necessary. 6. Bring a current list of your medications and current insurance cards. 7. You MUST have a responsible  person to drive you home. 8. Someone MUST be with you the first 24 hours after you arrive home or your discharge will be delayed. 9. Please wear clothes that are easy to get on and off and wear slip-on shoes.  Thank you for allowing [place] to care for you!   -- [facility]  Follow-Up in:  2 WEEKS AS SCHEDULED   At the Advanced Heart Failure Clinic, you and your health needs are our priority. We have a designated team specialized in the treatment of Heart Failure. This Care Team includes your primary Heart Failure Specialized Cardiologist (physician), Advanced Practice Providers (APPs- Physician Assistants and Nurse Practitioners), and Pharmacist who all work together to provide you with the care you need, when you need it.   You may see any of the following providers on your designated Care Team at your next follow up:  Dr. Arvilla Meres Dr. Marca Ancona Dr. Dorthula Nettles Dr. Theresia Bough Tonye Becket, NP Robbie Lis, [facility] [place], [place] Brynda Peon, NP [place] Lee, NP Karle Plumber, PharmD   Please be sure to bring in all your medications bottles to every appointment.   Need to Contact us:  If you have any questions or concerns before your next appointment please send [place] a message through [place] or call our office at [phone].    TO LEAVE A MESSAGE FOR THE NURSE SELECT OPTION 2, PLEASE LEAVE A MESSAGE INCLUDING: YOUR NAME  DATE OF BIRTH CALL BACK NUMBER REASON FOR CALL**this is important as we prioritize the call backs  YOU WILL RECEIVE A CALL BACK THE SAME DAY AS LONG AS YOU CALL BEFORE 4:00 PM

## 2023-02-18 ENCOUNTER — Emergency Department (HOSPITAL_COMMUNITY)
Admission: EM | Admit: 2023-02-18 | Discharge: 2023-02-18 | Disposition: A | Discharge status: Home / Self Care | Payer: Medicaid Other | Attending: Emergency Medicine | Admitting: Emergency Medicine

## 2023-02-18 ENCOUNTER — Other Ambulatory Visit: Payer: Self-pay

## 2023-02-18 ENCOUNTER — Emergency Department (HOSPITAL_COMMUNITY): Payer: Medicaid Other

## 2023-02-18 ENCOUNTER — Encounter (HOSPITAL_COMMUNITY): Payer: Self-pay | Admitting: *Deleted

## 2023-02-18 DIAGNOSIS — I509 Heart failure, unspecified: Secondary | ICD-10-CM | POA: Insufficient documentation

## 2023-02-18 DIAGNOSIS — I2510 Atherosclerotic heart disease of native coronary artery without angina pectoris: Secondary | ICD-10-CM | POA: Diagnosis not present

## 2023-02-18 DIAGNOSIS — R1011 Right upper quadrant pain: Secondary | ICD-10-CM | POA: Diagnosis present

## 2023-02-18 DIAGNOSIS — Z951 Presence of aortocoronary bypass graft: Secondary | ICD-10-CM | POA: Insufficient documentation

## 2023-02-18 DIAGNOSIS — Z7901 Long term (current) use of anticoagulants: Secondary | ICD-10-CM | POA: Insufficient documentation

## 2023-02-18 LAB — COMPREHENSIVE METABOLIC PANEL
ALT: 52 U/L — ABNORMAL HIGH (ref 0–44)
AST: 34 U/L (ref 15–41)
Albumin: 3.2 g/dL — ABNORMAL LOW (ref 3.5–5.0)
Alkaline Phosphatase: 76 U/L (ref 38–126)
Anion gap: 8 (ref 5–15)
BUN: 14 mg/dL (ref 6–20)
CO2: 23 mmol/L (ref 22–32)
Calcium: 9 mg/dL (ref 8.9–10.3)
Chloride: 110 mmol/L (ref 98–111)
Creatinine, Ser: 0.99 mg/dL (ref 0.61–1.24)
GFR, Estimated: >60 mL/min (ref >60)
Glucose, Bld: 119 mg/dL — ABNORMAL HIGH (ref 70–99)
Potassium: 4.4 mmol/L (ref 3.5–5.1)
Sodium: 141 mmol/L (ref 135–145)
Total Bilirubin: 0.7 mg/dL (ref <1.2)
Total Protein: 6 g/dL — ABNORMAL LOW (ref 6.5–8.1)

## 2023-02-18 LAB — URINALYSIS, ROUTINE W REFLEX MICROSCOPIC
Bacteria, UA: NONE SEEN
Bilirubin Urine: NEGATIVE
Glucose, UA: >=500 mg/dL — AB
Hgb urine dipstick: NEGATIVE
Ketones, ur: NEGATIVE mg/dL
Leukocytes,Ua: NEGATIVE
Nitrite: NEGATIVE
Protein, ur: NEGATIVE mg/dL
Specific Gravity, Urine: 1.024 (ref 1.005–1.030)
pH: 5 (ref 5.0–8.0)

## 2023-02-18 LAB — LIPASE, BLOOD: Lipase: 29 U/L (ref 11–51)

## 2023-02-18 LAB — CBC
HCT: 49.5 % (ref 39.0–52.0)
Hemoglobin: 15.5 g/dL (ref 13.0–17.0)
MCH: 29.6 pg (ref 26.0–34.0)
MCHC: 31.3 g/dL (ref 30.0–36.0)
MCV: 94.6 fL (ref 80.0–100.0)
Platelets: 227 10*3/uL (ref 150–400)
RBC: 5.23 MIL/uL (ref 4.22–5.81)
RDW: 13.5 % (ref 11.5–15.5)
WBC: 8.8 10*3/uL (ref 4.0–10.5)
nRBC: 0 % (ref 0.0–0.2)

## 2023-02-18 LAB — TROPONIN I (HIGH SENSITIVITY): Troponin I (High Sensitivity): 25 ng/L — ABNORMAL HIGH (ref <18)

## 2023-02-18 MED ORDER — HYDROMORPHONE HCL 1 MG/ML IJ SOLN
1.0000 mg | Freq: Once | INTRAMUSCULAR | Status: AC
Start: 2023-02-18 — End: 2023-02-18
  Administered 2023-02-18: 1 mg via INTRAVENOUS
  Filled 2023-02-18: qty 1

## 2023-02-18 MED ORDER — ONDANSETRON HCL 4 MG/2ML IJ SOLN
4.0000 mg | Freq: Once | INTRAMUSCULAR | Status: DC
  Filled 2023-02-18: qty 2

## 2023-02-18 MED ORDER — OXYCODONE-ACETAMINOPHEN 5-325 MG PO TABS: 1.0000 | ORAL_TABLET | ORAL | 0 refills | Status: DC | PRN

## 2023-02-18 NOTE — Discharge Instructions (Signed)
Please continue with plan for preoperative cardiac clearance scheduled for Friday.  Please continue taking pain medication at home.  I have sent in more pain medication your pharmacy on file.  Continue taking Zofran for your nausea.  If you begin to have fevers, intractable nausea or vomiting please return to the ED.

## 2023-02-18 NOTE — ED Notes (Addendum)
Patient transported to Ultrasound 

## 2023-02-18 NOTE — ED Triage Notes (Signed)
The pt is c/o abd pain he has been diagnosed with gallstones and he is due to have surgery soon  he is also getting a cardiac cath Friday am

## 2023-02-18 NOTE — ED Provider Notes (Signed)
[place] EMERGENCY DEPARTMENT AT [facility] Provider Note   CSN: [id number] Arrival date & time: 02/18/23  0046     History  Chief Complaint  Patient presents with   Abdominal Pain    Wyatt Donovan is a 37 y.o. male with medical history of NSTEMI in 2009, atrial fibrillation, CAD, CHF, depression, ischemic cardiomyopathy, status post CABG x 4 on blood thinner.  Patient presents to ED for evaluation of right upper quadrant abdominal pain.  The patient reports that he was seen at outside hospital, [facility], on 02/14/2023 secondary to epigastric pain.  At this time he was diagnosed with gallbladder stones, advised that he has gallbladder removed.  He had a lengthy discussion with on-call surgeon at that time who recommended patient needing cardiac clearance prior to operation anesthesia.  He was discharged with analgesia and followed up with his cardiologist on Tuesday.  His cardiologist had scheduled him for a catheterization on Friday for preop clearance.  Patient reports that his abdominal pain worsened tonight around 10 PM.  He reports he has been taking prescribed hydrocodone at home without relief.  He denies any nausea or vomiting but states he has been taking scheduled Zofran.  Denies any diarrhea, fevers.  Denies chest pain or shortness of breath.   Abdominal Pain      Home Medications Prior to Admission medications   Medication Sig Start Date End Date Taking? Authorizing Provider  oxyCODONE-acetaminophen (PERCOCET/ROXICET) 5-325 MG tablet Take 1 tablet by mouth every 4 (four) hours as needed for severe pain (pain score 7-10). 02/18/23  Yes Al Decant, PA-C  apixaban (ELIQUIS) 5 MG TABS tablet Take 1 tablet (5 mg total) by mouth 2 (two) times daily. 10/24/22   Modena Slater, DO  atorvastatin (LIPITOR) 80 MG tablet Take 1 tablet (80 mg total) by mouth daily. 11/30/22   Laurey Morale, MD  carvedilol (COREG) 3.125 MG tablet TAKE ONE TABLET BY MOUTH  TWICE DAILY WITH MEALS 05/04/22   Graciella Freer, PA-C  dapagliflozin propanediol (FARXIGA) 10 MG TABS tablet Take 1 tablet (10 mg total) by mouth daily before breakfast. 11/30/22   Laurey Morale, MD  linaclotide [facility]) 290 MCG CAPS capsule Take 1 capsule (290 mcg total) by mouth daily before breakfast. 08/04/22   Imogene Burn, MD  nitroGLYCERIN (NITROSTAT) 0.4 MG SL tablet Place 1 tablet (0.4 mg total) under the tongue every 5 (five) minutes x 3 doses as needed for chest pain. 01/27/22   Laurey Morale, MD  ondansetron (ZOFRAN-ODT) 4 MG disintegrating tablet Take 4 mg by mouth as needed for nausea or vomiting.    [provider]  pantoprazole (PROTONIX) 40 MG tablet Take 1 tablet (40 mg total) by mouth daily. 12/02/22 02/17/24  Laurey Morale, MD  sacubitril-valsartan (ENTRESTO) 24-26 MG Take 1 tablet by mouth 2 (two) times daily. 11/23/22   Laurey Morale, MD  sodium zirconium cyclosilicate (LOKELMA) 5 g packet Take 5 g by mouth daily. 02/17/23   Laurey Morale, MD  spironolactone (ALDACTONE) 25 MG tablet Take 1 tablet (25 mg total) by mouth daily. 11/30/22   Laurey Morale, MD      Allergies    Codeine    Review of Systems   Review of Systems  Gastrointestinal:  Positive for abdominal pain.  All other systems reviewed and are negative.   Physical Exam Updated Vital Signs BP 122/84   Pulse 76   Temp 98.1 F (36.7 C)  Resp (!) 22   Ht 6\' 1"  (1.854 m)   Wt 92.5 kg   SpO2 100%   BMI 26.90 kg/m  Physical Exam Vitals and nursing note reviewed.  Constitutional:      General: He is not in acute distress.    Appearance: Normal appearance. He is not ill-appearing, toxic-appearing or diaphoretic.  HENT:     Head: Normocephalic and atraumatic.     Nose: Nose normal.  Eyes:     Extraocular Movements: Extraocular movements intact.     Conjunctiva/sclera: Conjunctivae normal.     Pupils: Pupils are equal, round, and reactive to light.  Cardiovascular:      Rate and Rhythm: Normal rate and regular rhythm.  Pulmonary:     Effort: Pulmonary effort is normal.     Breath sounds: Normal breath sounds. No wheezing.  Abdominal:     General: Abdomen is flat. Bowel sounds are normal.     Palpations: Abdomen is soft.     Tenderness: There is abdominal tenderness.     Comments: RUQ tenderness  Musculoskeletal:     Cervical back: Normal range of motion and neck supple. No tenderness.  Skin:    General: Skin is warm and dry.     Capillary Refill: Capillary refill takes less than 2 seconds.  Neurological:     Mental Status: He is alert and oriented to person, place, and time.     ED Results / Procedures / Treatments   Labs (all labs ordered are listed, but only abnormal results are displayed) Labs Reviewed  COMPREHENSIVE METABOLIC PANEL - Abnormal; Notable for the following components:      Result Value   Glucose, Bld 119 (*)    Total Protein 6.0 (*)    Albumin 3.2 (*)    ALT 52 (*)    All other components within normal limits  URINALYSIS, ROUTINE W REFLEX MICROSCOPIC - Abnormal; Notable for the following components:   Glucose, UA >=500 (*)    All other components within normal limits  TROPONIN I (HIGH SENSITIVITY) - Abnormal; Notable for the following components:   Troponin I (High Sensitivity) 25 (*)    All other components within normal limits  LIPASE, BLOOD  CBC  TROPONIN I (HIGH SENSITIVITY)    EKG EKG Interpretation Date/Time:  Thursday February 18 2023 01:30:33 EST Ventricular Rate:  70 PR Interval:  306 QRS Duration:  120 QT Interval:  420 QTC Calculation: 453 R Axis:   24  Text Interpretation: Sinus rhythm with 1st degree A-V block Inferior infarct , age undetermined Abnormal ECG When compared with ECG of 23-Oct-2022 06:42, PREVIOUS ECG IS PRESENT Similar ST changes compared to prior Confirmed by Alona Bene [phone]) on 02/18/2023 1:34:52 AM  Radiology US Abdomen Limited RUQ (LIVER/GB) Result Date: 02/18/2023 CLINICAL  DATA:  Right upper quadrant pain EXAM: ULTRASOUND ABDOMEN LIMITED RIGHT UPPER QUADRANT COMPARISON:  None Available. FINDINGS: Gallbladder: There is a nonshadowing, echogenic focus in the gallbladder neck. A negative sonographic Eulah Pont sign was reported by the sonographer. No pericholecystic fluid gallbladder wall thickening. Common bile duct: Diameter: 4 mm Liver: No focal lesion identified. Within normal limits in parenchymal echogenicity. Portal vein is patent on color Doppler imaging with normal direction of blood flow towards the liver. Other: None. IMPRESSION: Nonshadowing, echogenic focus in the gallbladder neck, which may represent a small stone or polyp. No evidence of acute cholecystitis. Electronically Signed   By: Deatra Robinson M.D.   On: 02/18/2023 02:15   ECHOCARDIOGRAM COMPLETE Result  Date: 02/17/2023    ECHOCARDIOGRAM REPORT   Patient Name:   Wyatt Donovan Date of Exam: 02/17/2023 Medical Rec #:  [id number]        Height:       73.0 in Accession #:    [id number]       Weight:       203.9 lb Date of Birth:  11-22-1985        BSA:          2.169 m Patient Age:    37 years         BP:           106/76 mmHg Patient Gender: M                HR:           71 bpm. Exam Location:  Outpatient Procedure: 2D Echo, 3D Echo and Intracardiac Opacification Agent Indications:    CHF  History:        Patient has prior history of Echocardiogram examinations, most                 recent 12/04/2020. CHF and Cardiomyopathy, CAD and Previous                 Myocardial Infarction; Signs/Symptoms:Chest Pain.  Sonographer:    Webb Laws Referring Phys: [id number] DALTON S MCLEAN IMPRESSIONS  1. Global hypokinesis worse in the inferior wall . Left ventricular ejection fraction, by estimation, is 30 to 35%. The left ventricle has moderately decreased function. The left ventricle demonstrates global hypokinesis. Left ventricular diastolic parameters were normal.  2. Right ventricular systolic function is normal. The right  ventricular size is normal.  3. The mitral valve is abnormal. Mild mitral valve regurgitation. No evidence of mitral stenosis.  4. The aortic valve is tricuspid. There is mild calcification of the aortic valve. There is mild thickening of the aortic valve. Aortic valve regurgitation is not visualized. Aortic valve sclerosis is present, with no evidence of aortic valve stenosis.  5. The inferior vena cava is normal in size with greater than 50% respiratory variability, suggesting right atrial pressure of 3 mmHg. FINDINGS  Left Ventricle: Global hypokinesis worse in the inferior wall. Left ventricular ejection fraction, by estimation, is 30 to 35%. The left ventricle has moderately decreased function. The left ventricle demonstrates global hypokinesis. Definity contrast agent was given IV to delineate the left ventricular endocardial borders. The left ventricular internal cavity size was normal in size. There is no left ventricular hypertrophy. Left ventricular diastolic parameters were normal. Right Ventricle: The right ventricular size is normal. No increase in right ventricular wall thickness. Right ventricular systolic function is normal. Left Atrium: Left atrial size was normal in size. Right Atrium: Right atrial size was normal in size. Pericardium: Trivial pericardial effusion is present. The pericardial effusion is posterior to the left ventricle. Mitral Valve: The mitral valve is abnormal. There is mild thickening of the mitral valve leaflet(s). Mild mitral valve regurgitation. No evidence of mitral valve stenosis. Tricuspid Valve: The tricuspid valve is normal in structure. Tricuspid valve regurgitation is not demonstrated. No evidence of tricuspid stenosis. Aortic Valve: The aortic valve is tricuspid. There is mild calcification of the aortic valve. There is mild thickening of the aortic valve. Aortic valve regurgitation is not visualized. Aortic valve sclerosis is present, with no evidence of aortic valve  stenosis. Pulmonic Valve: The pulmonic valve was normal in structure. Pulmonic valve regurgitation is trivial. No evidence of  pulmonic stenosis. Aorta: The aortic root is normal in size and structure. Venous: The inferior vena cava is normal in size with greater than 50% respiratory variability, suggesting right atrial pressure of 3 mmHg. IAS/Shunts: No atrial level shunt detected by color flow Doppler.  LEFT VENTRICLE PLAX 2D LVIDd:         5.80 cm      Diastology LVIDs:         5.20 cm      LV e' medial:    4.13 cm/s LV PW:         0.60 cm      LV E/e' medial:  17.6 LV IVS:        1.20 cm      LV e' lateral:   11.90 cm/s LVOT diam:     2.50 cm      LV E/e' lateral: 6.1 LV SV:         59 LV SV Index:   27 LVOT Area:     4.91 cm                              3D Volume EF: LV Volumes (MOD)            3D EF:        48 % LV vol d, MOD A2C: 153.0 ml LV EDV:       136 ml LV vol d, MOD A4C: 169.0 ml LV ESV:       71 ml LV vol s, MOD A2C: 72.8 ml  LV SV:        65 ml LV vol s, MOD A4C: 77.0 ml LV SV MOD A2C:     80.2 ml LV SV MOD A4C:     169.0 ml LV SV MOD BP:      85.9 ml RIGHT VENTRICLE RV Basal diam:  2.30 cm RV S prime:     10.80 cm/s TAPSE (M-mode): 1.7 cm LEFT ATRIUM             Index        RIGHT ATRIUM          Index LA diam:        3.60 cm 1.66 cm/m   RA Area:     6.48 cm LA Vol (A2C):   33.9 ml 15.63 ml/m  RA Volume:   8.15 ml  3.76 ml/m LA Vol (A4C):   22.2 ml 10.23 ml/m LA Biplane Vol: 27.8 ml 12.81 ml/m  AORTIC VALVE             PULMONIC VALVE LVOT Vmax:   67.30 cm/s  PR End Diast Vel: 1.21 msec LVOT Vmean:  49.800 cm/s LVOT VTI:    0.120 m  AORTA Ao Root diam: 3.10 cm Ao Asc diam:  2.50 cm MITRAL VALVE MV Area (PHT): 4.89 cm    SHUNTS MV Decel Time: 155 msec    Systemic VTI:  0.12 m MV E velocity: 72.50 cm/s  Systemic Diam: 2.50 cm MV A velocity: 39.50 cm/s MV E/A ratio:  1.84 Charlton Haws MD Electronically signed by Charlton Haws MD Signature Date/Time: 02/17/2023/12:38:18 PM    Final      Procedures Procedures   Medications Ordered in ED Medications  ondansetron [facility]) injection 4 mg (has no administration in time range)  HYDROmorphone (DILAUDID) injection 1 mg (1 mg Intravenous Given 02/18/23 0214)    ED Course/ Medical Decision Making/  A&P  Medical Decision Making Amount and/or Complexity of Data Reviewed Radiology: ordered.  Risk Prescription drug management.   37 year old male presents to the ED for evaluation.  Please see HPI for further details.  On examination the patient is afebrile and nontachycardic.  His lung sounds are clear bilaterally, he is not hypoxic.  His abdomen has tenderness in the right upper quadrant.  Neurological examination is at baseline.  No edema to bilateral lower extremities.  Overall nontoxic in appearance.  Does not appear systemically ill.  Patient CBC without leukocytosis, no anemia.  Metabolic panel unremarkable.  Urinalysis unremarkable.  Lipase WNL.  Troponin collect due to patient history shows baseline troponin of 25, denies chest pain or shortness of breath.  Right upper quadrant ultrasound shows no evidence of acute cholecystitis.  The patient has no white count.  Patient pain well-controlled 1 mg Dilaudid.  At this time, patient will be discharged and advised to follow-up for his preoperative cardiac clearance on Friday.  He was advised to continue taking pain medication at home as well as antinausea medication.  Encouraged to return to the ED with worsening symptoms such as fevers or intractable nausea and vomiting.  Patient stable to discharge home at this time.   Final Clinical Impression(s) / ED Diagnoses Final diagnoses:  Right upper quadrant abdominal pain    Rx / DC Orders ED Discharge Orders          Ordered    oxyCODONE-acetaminophen (PERCOCET/ROXICET) 5-325 MG tablet  Every 4 hours PRN        02/18/23 0306              Al Decant, PA-C 02/18/23 0306    Maia Plan,  MD 02/18/23 [phone]

## 2023-02-19 ENCOUNTER — Other Ambulatory Visit: Payer: Self-pay

## 2023-02-19 ENCOUNTER — Encounter (HOSPITAL_COMMUNITY)
Admission: RE | Disposition: A | Discharge status: Home / Self Care | Payer: Self-pay | Source: Home / Self Care | Attending: Cardiology

## 2023-02-19 ENCOUNTER — Ambulatory Visit (HOSPITAL_COMMUNITY)
Admission: RE | Admit: 2023-02-19 | Discharge: 2023-02-19 | Disposition: A | Discharge status: Home / Self Care | Payer: Medicaid Other | Attending: Cardiology | Admitting: Cardiology

## 2023-02-19 DIAGNOSIS — I4892 Unspecified atrial flutter: Secondary | ICD-10-CM | POA: Diagnosis not present

## 2023-02-19 DIAGNOSIS — Z95810 Presence of automatic (implantable) cardiac defibrillator: Secondary | ICD-10-CM | POA: Insufficient documentation

## 2023-02-19 DIAGNOSIS — I2510 Atherosclerotic heart disease of native coronary artery without angina pectoris: Secondary | ICD-10-CM

## 2023-02-19 DIAGNOSIS — Z7901 Long term (current) use of anticoagulants: Secondary | ICD-10-CM | POA: Insufficient documentation

## 2023-02-19 DIAGNOSIS — I5022 Chronic systolic (congestive) heart failure: Secondary | ICD-10-CM | POA: Diagnosis not present

## 2023-02-19 DIAGNOSIS — I255 Ischemic cardiomyopathy: Secondary | ICD-10-CM | POA: Diagnosis not present

## 2023-02-19 DIAGNOSIS — Z955 Presence of coronary angioplasty implant and graft: Secondary | ICD-10-CM | POA: Insufficient documentation

## 2023-02-19 DIAGNOSIS — I25810 Atherosclerosis of coronary artery bypass graft(s) without angina pectoris: Secondary | ICD-10-CM | POA: Diagnosis not present

## 2023-02-19 DIAGNOSIS — Z79899 Other long term (current) drug therapy: Secondary | ICD-10-CM | POA: Insufficient documentation

## 2023-02-19 DIAGNOSIS — I25110 Atherosclerotic heart disease of native coronary artery with unstable angina pectoris: Secondary | ICD-10-CM | POA: Diagnosis present

## 2023-02-19 DIAGNOSIS — I252 Old myocardial infarction: Secondary | ICD-10-CM | POA: Diagnosis not present

## 2023-02-19 DIAGNOSIS — I2582 Chronic total occlusion of coronary artery: Secondary | ICD-10-CM | POA: Insufficient documentation

## 2023-02-19 DIAGNOSIS — E875 Hyperkalemia: Secondary | ICD-10-CM | POA: Diagnosis not present

## 2023-02-19 DIAGNOSIS — I4891 Unspecified atrial fibrillation: Secondary | ICD-10-CM | POA: Insufficient documentation

## 2023-02-19 DIAGNOSIS — Z87891 Personal history of nicotine dependence: Secondary | ICD-10-CM | POA: Insufficient documentation

## 2023-02-19 DIAGNOSIS — Z951 Presence of aortocoronary bypass graft: Secondary | ICD-10-CM | POA: Insufficient documentation

## 2023-02-19 HISTORY — PX: RIGHT/LEFT HEART CATH AND CORONARY/GRAFT ANGIOGRAPHY: CATH118267

## 2023-02-19 LAB — POCT I-STAT EG7
Acid-Base Excess: 0 mmol/L (ref 0.0–2.0)
Acid-Base Excess: 0 mmol/L (ref 0.0–2.0)
Bicarbonate: 25.9 mmol/L (ref 20.0–28.0)
Bicarbonate: 26.4 mmol/L (ref 20.0–28.0)
Calcium, Ion: 1.33 mmol/L (ref 1.15–1.40)
Calcium, Ion: 1.34 mmol/L (ref 1.15–1.40)
HCT: 43 % (ref 39.0–52.0)
HCT: 44 % (ref 39.0–52.0)
Hemoglobin: 14.6 g/dL (ref 13.0–17.0)
Hemoglobin: 15 g/dL (ref 13.0–17.0)
O2 Saturation: 65 %
O2 Saturation: 67 %
Potassium: 4.6 mmol/L (ref 3.5–5.1)
Potassium: 4.7 mmol/L (ref 3.5–5.1)
Sodium: 140 mmol/L (ref 135–145)
Sodium: 140 mmol/L (ref 135–145)
TCO2: 27 mmol/L (ref 22–32)
TCO2: 28 mmol/L (ref 22–32)
pCO2, Ven: 46.4 mm[Hg] (ref 44–60)
pCO2, Ven: 46.4 mm[Hg] (ref 44–60)
pH, Ven: 7.355 (ref 7.25–7.43)
pH, Ven: 7.364 (ref 7.25–7.43)
pO2, Ven: 35 mm[Hg] (ref 32–45)
pO2, Ven: 36 mm[Hg] (ref 32–45)

## 2023-02-19 SURGERY — RIGHT/LEFT HEART CATH AND CORONARY/GRAFT ANGIOGRAPHY
Anesthesia: LOCAL

## 2023-02-19 MED ORDER — SPIRONOLACTONE 25 MG PO TABS: 25.0000 mg | ORAL_TABLET | Freq: Every day | ORAL | Status: DC

## 2023-02-19 MED ORDER — LIDOCAINE HCL (PF) 1 % IJ SOLN
INTRAMUSCULAR | Status: AC
  Filled 2023-02-19: qty 30

## 2023-02-19 MED ORDER — HEPARIN SODIUM (PORCINE) 1000 UNIT/ML IJ SOLN
INTRAMUSCULAR | Status: AC
  Filled 2023-02-19: qty 10

## 2023-02-19 MED ORDER — SODIUM CHLORIDE 0.9 % IV SOLN: INTRAVENOUS | Status: DC

## 2023-02-19 MED ORDER — FENTANYL CITRATE (PF) 100 MCG/2ML IJ SOLN
INTRAMUSCULAR | Status: DC | PRN
  Administered 2023-02-19: 25 ug via INTRAVENOUS

## 2023-02-19 MED ORDER — SODIUM CHLORIDE 0.9% FLUSH: 3.0000 mL | Freq: Two times a day (BID) | INTRAVENOUS | Status: DC

## 2023-02-19 MED ORDER — VERAPAMIL HCL 2.5 MG/ML IV SOLN
INTRAVENOUS | Status: AC
  Filled 2023-02-19: qty 2

## 2023-02-19 MED ORDER — ACETAMINOPHEN 325 MG PO TABS: 650.0000 mg | ORAL_TABLET | ORAL | Status: DC | PRN

## 2023-02-19 MED ORDER — VERAPAMIL HCL 2.5 MG/ML IV SOLN
INTRAVENOUS | Status: DC | PRN
  Administered 2023-02-19: 10 mL via INTRA_ARTERIAL

## 2023-02-19 MED ORDER — HEPARIN (PORCINE) IN NACL 1000-0.9 UT/500ML-% IV SOLN
INTRAVENOUS | Status: DC | PRN
  Administered 2023-02-19 (×2): 500 mL

## 2023-02-19 MED ORDER — FENTANYL CITRATE (PF) 100 MCG/2ML IJ SOLN
INTRAMUSCULAR | Status: AC
  Filled 2023-02-19: qty 2

## 2023-02-19 MED ORDER — HEPARIN SODIUM (PORCINE) 1000 UNIT/ML IJ SOLN
INTRAMUSCULAR | Status: DC | PRN
  Administered 2023-02-19: 4500 [IU] via INTRAVENOUS

## 2023-02-19 MED ORDER — SODIUM CHLORIDE 0.9 % IV SOLN: 250.0000 mL | INTRAVENOUS | Status: DC | PRN

## 2023-02-19 MED ORDER — HYDRALAZINE HCL 20 MG/ML IJ SOLN: 10.0000 mg | INTRAMUSCULAR | Status: DC | PRN

## 2023-02-19 MED ORDER — LABETALOL HCL 5 MG/ML IV SOLN: 10.0000 mg | INTRAVENOUS | Status: DC | PRN

## 2023-02-19 MED ORDER — ONDANSETRON HCL 4 MG/2ML IJ SOLN
4.0000 mg | Freq: Four times a day (QID) | INTRAMUSCULAR | Status: DC | PRN
Start: 2023-02-19 — End: 2023-02-19

## 2023-02-19 MED ORDER — ASPIRIN 81 MG PO CHEW
81.0000 mg | CHEWABLE_TABLET | Freq: Once | ORAL | Status: AC
  Administered 2023-02-19: 81 mg via ORAL
  Filled 2023-02-19: qty 1

## 2023-02-19 MED ORDER — DIGOXIN 125 MCG PO TABS: 125.0000 ug | ORAL_TABLET | Freq: Every day | ORAL | 11 refills | Status: DC

## 2023-02-19 MED ORDER — IOHEXOL 350 MG/ML SOLN
INTRAVENOUS | Status: DC | PRN
  Administered 2023-02-19: 40 mL

## 2023-02-19 MED ORDER — MIDAZOLAM HCL 2 MG/2ML IJ SOLN
INTRAMUSCULAR | Status: AC
  Filled 2023-02-19: qty 2

## 2023-02-19 MED ORDER — LIDOCAINE HCL (PF) 1 % IJ SOLN
INTRAMUSCULAR | Status: DC | PRN
  Administered 2023-02-19: 2 mL via INTRADERMAL
  Administered 2023-02-19: 5 mL via INTRADERMAL

## 2023-02-19 MED ORDER — MIDAZOLAM HCL 2 MG/2ML IJ SOLN
INTRAMUSCULAR | Status: DC | PRN
  Administered 2023-02-19: 1 mg via INTRAVENOUS

## 2023-02-19 MED ORDER — SODIUM CHLORIDE 0.9% FLUSH: 3.0000 mL | INTRAVENOUS | Status: DC | PRN

## 2023-02-19 SURGICAL SUPPLY — 12 items
CATH INFINITI 5FR MULTPACK ANG (CATHETERS) IMPLANT
CATH SWAN GANZ 7F STRAIGHT (CATHETERS) IMPLANT
DEVICE RAD COMP TR BAND LRG (VASCULAR PRODUCTS) IMPLANT
GLIDESHEATH SLEND SS 6F .021 (SHEATH) IMPLANT
GLIDESHEATH SLENDER 7FR .021G (SHEATH) IMPLANT
GUIDEWIRE .025 260CM (WIRE) IMPLANT
GUIDEWIRE INQWIRE 1.5J.035X260 (WIRE) IMPLANT
INQWIRE 1.5J .035X260CM (WIRE) ×2
KIT SYRINGE INJ CVI SPIKEX1 (MISCELLANEOUS) IMPLANT
PACK CARDIAC CATHETERIZATION (CUSTOM PROCEDURE TRAY) ×1 IMPLANT
SET ATX-X65L (MISCELLANEOUS) IMPLANT
SHEATH PROBE COVER 6X72 (BAG) IMPLANT

## 2023-02-19 NOTE — Progress Notes (Signed)
Patient and father was given discharge instructions. Both verbalized understanding. ?

## 2023-02-19 NOTE — Discharge Instructions (Addendum)

## 2023-02-19 NOTE — Interval H&P Note (Signed)
History and Physical Interval Note:  02/19/2023 7:44 AM  Wyatt Donovan  has presented today for surgery, with the diagnosis of Heart Failure and Chest Pain.  The various methods of treatment have been discussed with the patient and family. After consideration of risks, benefits and other options for treatment, the patient has consented to  Procedure(s): RIGHT/LEFT HEART CATH AND CORONARY/GRAFT ANGIOGRAPHY (N/A) as a surgical intervention.  The patient's history has been reviewed, patient examined, no change in status, stable for surgery.  I have reviewed the patient's chart and labs.  Questions were answered to the patient's satisfaction.     Maleea Camilo [employer]

## 2023-02-22 ENCOUNTER — Encounter (HOSPITAL_COMMUNITY): Payer: Self-pay | Admitting: Cardiology

## 2023-02-22 ENCOUNTER — Telehealth (HOSPITAL_COMMUNITY): Payer: Self-pay | Admitting: Cardiology

## 2023-02-22 MED ORDER — DIGOXIN 125 MCG PO TABS: 125.0000 ug | ORAL_TABLET | Freq: Every day | ORAL | 11 refills | Status: DC

## 2023-02-22 NOTE — Telephone Encounter (Signed)
Pt aware to start digoxin Reports pharmacy only dispensed 4 tablets, will resend active RX to pharmacy  -CONFIRMED [facility]-REFERRAL WAS SENT VIA JENNA B, RN

## 2023-02-22 NOTE — Telephone Encounter (Signed)
-----   Message from Marca Ancona sent at 02/19/2023  9:04 AM EST ----- Please make sure this patient has appointment with surgery for gallbladder. Needs to start digoxin 0.125 daily, send to pharmacy in [place].

## 2023-02-23 ENCOUNTER — Telehealth (HOSPITAL_COMMUNITY): Payer: Self-pay | Admitting: Cardiology

## 2023-02-23 NOTE — Telephone Encounter (Signed)
Patient/pts family called to report gen surgery appt scheduled for 1/16 Pt states he is unable to wait that long to be seen. Will run out of pain meds soon  Requests provider to call to expedite referral   Advised would speak with a provider to iron out details, however images,OV, labs etc were sent at the time of referral and often times appts are scheduled based on records.  Aware to report to ER for worsening  -abd pain -n/v  -dark stools

## 2023-02-26 ENCOUNTER — Other Ambulatory Visit (HOSPITAL_COMMUNITY): Payer: Medicaid Other

## 2023-02-26 NOTE — Telephone Encounter (Signed)
I will send a message to the physicians there.

## 2023-03-02 ENCOUNTER — Other Ambulatory Visit (HOSPITAL_COMMUNITY): Payer: Self-pay | Admitting: Cardiology

## 2023-03-02 DIAGNOSIS — I5022 Chronic systolic (congestive) heart failure: Secondary | ICD-10-CM

## 2023-03-02 NOTE — Progress Notes (Signed)
 Advanced Heart Failure Clinic Note   Primary Care: Orpha Yancey LABOR, MD HF Cardiologist: Dr. Rolan EP: Dr Cindie   Chief Complaint: Heart Failure   HPI: Patient is a 37 y.o. with history of early onset CAD s/p CABG, ischemic cardiomyopathy, and LV thrombus. Patient had initial MI in 2012 at age 63, PCI to LAD.  He had inferoposterior MI in 4/22.  LHC showed 3 vessel disease, and patient had CABG x 4. Cardiac MRI in 5/22 showed LV EF 27% with LV thrombus, there was significant viability.  Post-op, patient had GI bleeding and anticoagulation was stopped.  He quit smoking after CABG.    Follow up 10/04/20 carvedilol  decreased due to dizziness and repeat echo arranged.  Echo 8/22 EF 25-30%. He was referred to EP for ICD consideration => [employer] ICD.   Admitted 12/03/20 with NSTEMI. Coronary angiogram showed patent LIMA to LAD with occlusion of all other grafts. S/p PCI/DES to native mid LCX 99% stenosis.  Native LAD and RCA occluded. Angina resolved post PCI. Farxiga  and Coreg  added back, however spiro and losartan  held due to hyperkalemia during this admission. His hospitalization was complicated by atrial fibrillation and flutter with RVR. He received IV amiodarone  and started on Eliquis .  He underwent DCCV with conversion to NSR on 12/06/20. Discharged home 12/06/20, weight 177.5 lbs.  In 2/24, patient had cardiac contracility modulator placement.   Patient presented to [facility] in 12/24 with 2-3 months of RUQ pain, worse x 2-3 weeks.  Abdominal CT showed a stone in the cystic duct with mildly distended gallbladder but no evidence for cholecystitis.  He was discharged from the ER but told that he will need cholecystectomy.   Echo 01/2023 reviewed, EF 30-35%, global hypokinesis, mild RV dysfunction, IVC not dilated.   Underwent cath 01/2023 Normal filling pressures. Low cardiac output.  CI 2 by Fick, 1.82 thermodilution. Coronary anatomy is unchanged.  Grafts all occluded  (known prior) except LIMA-LAD.  Occluded native RCA (known from prior).  Patent mid LCx stent. Post cath he was started on digoxin  0.125 mg   Today he returns for HF follow up.Complaining of intermittent RUQ abd pain usually every and fatigue. Not currently have abdominal pain. SOB after 1/2 block.  Denies PND/Orthopnea. Appetite ok. No fever or chills. Weight at home  207 pounds. Taking all medications. Lives with his Dad.   [employer] ICD: 21 . Activity 0.5  hours per day. Thoracic Impedance elevated.   Labs (10/22): K 4.5, creatinine 0.95, LDL 45 Labs (3/23): K 4.2, creatinine 0.94 Labs (5/23): K 4.5, creatinine 1.19 Labs (6/23): K 4.5, creatinine 1.03, LDL 60, TSH 0.430, HFTs nl  Labs (8/23): K 4.1, creatinine 1.08 Labs (11/23): K 4.6, creatinine 1.13 Labs (12/24): K 5.7, creatinine 1.04, hgb 15.8, HS-TnI 32 Labs (03/02/23): K 4.9 Creatinine 1  PMH: 1. Hyperlipidemia 2. CAD: MI at age 70 in 2012, PCI to LAD.   - Inferoposterior MI in 4/22: LHC showed 3 vessel disease.  CABG with LIMA-LAD, radial to OM1, sequential SVG-PDA and D1.  - NSTEMI-->LHC (10/22): patent LIMA to LAD, all other grafts occluded. S/p PCI DES native mid LCx 99% stenosis, LAD and RCA totally occluded.  3. Post-operative GI bleeding after CABG 4. Chronic systolic CHF: Ischemic cardiomyopathy.   - Cardiac MRI (5/22): LV EF 27%, RV EF 57%, apical thrombus, extensive viability.  - Echo (8/22) EF 20-25% - Echo (10/22): EF 35-40% - cMRI (10/22): LVEF 29%, RV EF 45%, no LV  thrombus, LGE suggestive of prior MI.  - [employer] ICD  - Cardiac contractility modulator implantation in 2/24.  - Echo (12/24): EF 30-35%, global hypokinesis, mild RV dysfunction, IVC not dilated. 5. LV thrombus  - No thrombus on 10/22 cMRI.  6. Prior smoker: Quit 5/22.  7. H/o hyperkalemia 8. Atrial fibrillation/flutter: Paroxysmal.  - DCCV 10/22 9. ABIs (5/22): Normal.  10. Colonic polyps 11. Symptomatic  cholelithiasis  Current Outpatient Medications  Medication Sig Dispense Refill   apixaban  (ELIQUIS ) 5 MG TABS tablet Take 1 tablet (5 mg total) by mouth 2 (two) times daily.     atorvastatin  (LIPITOR ) 80 MG tablet Take 1 tablet (80 mg total) by mouth daily. 30 tablet 2   carvedilol  (COREG ) 3.125 MG tablet TAKE ONE TABLET BY MOUTH TWICE DAILY WITH MEALS 60 tablet 6   dapagliflozin  propanediol (FARXIGA ) 10 MG TABS tablet Take 1 tablet (10 mg total) by mouth daily before breakfast. 30 tablet 2   digoxin  (LANOXIN ) 0.125 MG tablet Take 1 tablet (125 mcg total) by mouth daily. 30 tablet 11   furosemide  (LASIX ) 20 MG tablet Take 20 mg by mouth daily.     linaclotide  (LINZESS ) 290 MCG CAPS capsule Take 1 capsule (290 mcg total) by mouth daily before breakfast. 30 capsule 5   nitroGLYCERIN  (NITROSTAT ) 0.4 MG SL tablet Place 1 tablet (0.4 mg total) under the tongue every 5 (five) minutes x 3 doses as needed for chest pain. 15 tablet 3   Omega-3 Fatty Acids (FISH OIL) 500 MG CAPS Take 1,000 mg by mouth daily.     ondansetron  (ZOFRAN -ODT) 4 MG disintegrating tablet Take 4 mg by mouth every 8 (eight) hours as needed for nausea or vomiting.     oxyCODONE -acetaminophen  (PERCOCET/ROXICET) 5-325 MG tablet Take 1 tablet by mouth every 4 (four) hours as needed for severe pain (pain score 7-10). 10 tablet 0   pantoprazole  (PROTONIX ) 40 MG tablet Take 1 tablet (40 mg total) by mouth daily. 30 tablet 0   sacubitril -valsartan  (ENTRESTO ) 24-26 MG Take 1 tablet by mouth 2 (two) times daily. 60 tablet 0   sodium zirconium cyclosilicate  (LOKELMA ) 5 g packet Take 5 g by mouth daily. 30 packet 6   spironolactone  (ALDACTONE ) 25 MG tablet Take 1 tablet (25 mg total) by mouth daily.     No current facility-administered medications for this encounter.   Allergies  Allergen Reactions   Codeine Itching   Social History   Socioeconomic History   Marital status: Single    Spouse name: Not on file   Number of children: Not  on file   Years of education: Not on file   Highest education level: High school graduate  Occupational History   Occupation: Not actively working    Comment: applied for disability x 2.   Tobacco Use   Smoking status: Former    Current packs/day: 0.00    Average packs/day: 1 pack/day for 22.0 years (22.0 ttl pk-yrs)    Types: Cigarettes, Cigars    Start date: 06/29/1998    Quit date: 06/28/2020    Years since quitting: 2.6   Smokeless tobacco: Never   Tobacco comments:    cigarettes stopped in 2020, [employer] started in 2018-04-05/day  Vaping Use   Vaping status: Never Used  Substance and Sexual Activity   Alcohol use: Never   Drug use: Not Currently    Types: Marijuana   Sexual activity: Never  Other Topics Concern   Not on file  Social History  Narrative   Single   Lives with his parents   Trying to get his GED   DeGent date all cultures   Social Drivers of Health   Financial Resource Strain: Medium Risk (12/04/2020)   Overall Financial Resource Strain (CARDIA)    Difficulty of Paying Living Expenses: Somewhat hard  Food Insecurity: No Food Insecurity (12/04/2020)   Hunger Vital Sign    Worried About Running Out of Food in the Last Year: Never true    Ran Out of Food in the Last Year: Never true  Transportation Needs: No Transportation Needs (12/04/2020)   PRAPARE - Administrator, Civil Service (Medical): No    Lack of Transportation (Non-Medical): No  Physical Activity: Not on file  Stress: Not on file  Social Connections: Not on file  Intimate Partner Violence: Not on file   Family History  Problem Relation Age of Onset   Colon cancer Father 60   Colon cancer Paternal Grandfather    Colon cancer Paternal Aunt    Hypertension Neg Hx    Diabetes Neg Hx    Coronary artery disease Neg Hx    Esophageal cancer Neg Hx    Rectal cancer Neg Hx    Stomach cancer Neg Hx    BP 121/85   Pulse 88   Wt 93.9 kg (207 lb)   SpO2 98%   BMI 27.31 kg/m    Wt Readings from Last 3 Encounters:  03/04/23 93.9 kg (207 lb)  02/19/23 98.9 kg (218 lb)  02/18/23 92.5 kg (203 lb 14.8 oz)   PHYSICAL EXAM: General:   No resp difficulty. Walked in the clinic with a cane.  HEENT: normal Neck: supple. no JVD. Carotids 2+ bilat; no bruits. No lymphadenopathy or thryomegaly appreciated. Cor: PMI nondisplaced. Regular rate & rhythm. No rubs, gallops or murmurs. Lungs: clear Abdomen: soft, nontender, nondistended. No hepatosplenomegaly. No bruits or masses. Good bowel sounds. Extremities: no cyanosis, clubbing, rash, edema Neuro: alert & orientedx3, cranial nerves grossly intact. moves all 4 extremities w/o difficulty. Affect pleasant  ASSESSMENT & PLAN:  1. Symptomatic cholelithiasis: Patient has had symptomatic cholelithiasis for several months, worse for several weeks.  Recently seen in the ER at [facility], CT abdomen showed obstructing stone in the cystic duct with mildly distended gallbladder but no evidence for cholecystitis.  He was discharged from the ER but told that he will need cholecystectomy.   Referred to [facility]. Dr Rolan reached out [facility]. He has an appointment 03/12/23 2. CAD: Early onset CAD s/p MI with LAD PCI in 2012,followed by CABG  x 4 4/22  Admit 10/22 with NSTEMI. LHC with patent LIMA to LAD, all other grafts occluded. S/p PCI DES native mid LCx 99% stenosis.  Repeat 01/2023 LHC -Coronary anatomy is unchanged.  Grafts all occluded (known prior) except LIMA-LAD.  Occluded native RCA (known from prior).  Patent mid LCx stent - No chest pain.  - He is now off Plavix  (stopped 8/23 due to BRBPR). - He is on Eliquis  so no ASA.   - Continue atorvastatin  80 mg daily  3. Chronic Systolic CHF: Ischemic cardiomyopathy.  [employer] ICD.  Cardiac MRI in 10/22 showed LVEF 29% and RVEF 45%.  Patient has a cardiac contractility modulator.  Echo 01/2023, showing EF 30-35%, global hypokinesis, mild RV dysfunction, IVC not dilated.  GDMT has been  limited by hypotension & hyperkalemia, K is now controlled with Lokelma .   Had RHC 01/2023 Normal filling pressures. Low cardiac output.  CI 2 by Fick, 1.82 thermodilution.  -NYHA IIIc. Volume status stabler. Continue lasix  20 mg daily   - Continue digoxin  0.125 mg daily , check dig level  - Continue Coreg  3.125 mg bid , intolerant uptitration due to fatigue.  - Continue Farxiga  10 mg daily.  - Continue spironolactone  25 mg daily + Lokelma  5 g daily given history of hyperkalemia.  - Continue Entresto  24/26 bid.  -Reviewed recent BMET 04/01/22, stable.  -4. 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  No thrombus on echo 08/22. No thrombus on cMRI 12/06/20.  No thrombus on most recent ECHO.   - Continue Eliquis  5 mg bid 5. Hyperkalemia: He has had a tendency towards hyperkalemia and has been on Lokelma  in order to stay on spironolactone  and Entresto .   - Recent BMET, K stable 03/02/23.  - Continue Lokelma  5 g daily.  6. Atrial flutter/fibrillation: Both arrhythmias noted in 10/22.  He tolerated poorly with worsened symptoms.  He had DCCV in 10/22.  Previously on amio but later stopped. He has maintained SR.  -Regular on exam  - Continue Eliquis .  - Continue to follow with EP, ablation if he has recurrent AF.  6. Former Smoker: quit smoking 06/2020.  Follow up with Dr Rolan 6-8 weeks   Rebeka Kimble NP-C  03/04/23

## 2023-03-04 ENCOUNTER — Encounter: Payer: Self-pay | Admitting: Cardiology

## 2023-03-04 ENCOUNTER — Ambulatory Visit (HOSPITAL_COMMUNITY)
Admission: RE | Admit: 2023-03-04 | Discharge: 2023-03-04 | Disposition: A | Discharge status: Home / Self Care | Payer: Medicaid Other | Source: Ambulatory Visit | Attending: Adult Health | Admitting: Adult Health

## 2023-03-04 VITALS — BP 121/85 | HR 88 | Wt 207.0 lb

## 2023-03-04 DIAGNOSIS — F17290 Nicotine dependence, other tobacco product, uncomplicated: Secondary | ICD-10-CM | POA: Insufficient documentation

## 2023-03-04 DIAGNOSIS — I5022 Chronic systolic (congestive) heart failure: Secondary | ICD-10-CM | POA: Insufficient documentation

## 2023-03-04 DIAGNOSIS — R1011 Right upper quadrant pain: Secondary | ICD-10-CM | POA: Insufficient documentation

## 2023-03-04 DIAGNOSIS — Z79899 Other long term (current) drug therapy: Secondary | ICD-10-CM | POA: Insufficient documentation

## 2023-03-04 DIAGNOSIS — Z7984 Long term (current) use of oral hypoglycemic drugs: Secondary | ICD-10-CM | POA: Diagnosis not present

## 2023-03-04 DIAGNOSIS — I480 Paroxysmal atrial fibrillation: Secondary | ICD-10-CM | POA: Insufficient documentation

## 2023-03-04 DIAGNOSIS — Z951 Presence of aortocoronary bypass graft: Secondary | ICD-10-CM | POA: Insufficient documentation

## 2023-03-04 DIAGNOSIS — I4892 Unspecified atrial flutter: Secondary | ICD-10-CM | POA: Diagnosis not present

## 2023-03-04 DIAGNOSIS — K8020 Calculus of gallbladder without cholecystitis without obstruction: Secondary | ICD-10-CM | POA: Insufficient documentation

## 2023-03-04 DIAGNOSIS — E875 Hyperkalemia: Secondary | ICD-10-CM | POA: Insufficient documentation

## 2023-03-04 DIAGNOSIS — Z86718 Personal history of other venous thrombosis and embolism: Secondary | ICD-10-CM | POA: Insufficient documentation

## 2023-03-04 DIAGNOSIS — Z955 Presence of coronary angioplasty implant and graft: Secondary | ICD-10-CM | POA: Insufficient documentation

## 2023-03-04 DIAGNOSIS — I25119 Atherosclerotic heart disease of native coronary artery with unspecified angina pectoris: Secondary | ICD-10-CM | POA: Diagnosis not present

## 2023-03-04 DIAGNOSIS — Z7901 Long term (current) use of anticoagulants: Secondary | ICD-10-CM | POA: Diagnosis not present

## 2023-03-04 DIAGNOSIS — I255 Ischemic cardiomyopathy: Secondary | ICD-10-CM | POA: Diagnosis not present

## 2023-03-04 DIAGNOSIS — I252 Old myocardial infarction: Secondary | ICD-10-CM | POA: Diagnosis not present

## 2023-03-04 LAB — DIGOXIN LEVEL: Digoxin Level: <0.2 ng/mL — ABNORMAL LOW (ref 0.8–2.0)

## 2023-03-04 NOTE — Patient Instructions (Signed)
 Medication Changes:  No Changes In Medications at this time.   Lab Work:  Labs done today, your results will be available in MyChart, we will contact you for abnormal readings.  Follow-Up in: 6-8 weeks as scheduled   At the [facility] Clinic, you and your health needs are our priority. We have a designated team specialized in the treatment of Heart Failure. This Care Team includes your primary Heart Failure Specialized Cardiologist (physician), Advanced Practice Providers (APPs- Physician Assistants and Nurse Practitioners), and Pharmacist who all work together to provide you with the care you need, when you need it.   You may see any of the following providers on your designated Care Team at your next follow up:  Dr. Toribio Fuel Dr. Ezra Shuck Dr. Ria Commander Dr. Odis Brownie Greig Mosses, NP Caffie Shed, [place] [facility] [place], [place] Beckey Coe, NP Jordan Lee, NP Tinnie Redman, PharmD   Please be sure to bring in all your medications bottles to every appointment.   Need to Contact Us :  If you have any questions or concerns before your next appointment please send us  a message through [place] or call our office at [phone].    TO LEAVE A MESSAGE FOR THE NURSE SELECT OPTION 2, PLEASE LEAVE A MESSAGE INCLUDING: YOUR NAME DATE OF BIRTH CALL BACK NUMBER REASON FOR CALL**this is important as we prioritize the call backs  YOU WILL RECEIVE A CALL BACK THE SAME DAY AS LONG AS YOU CALL BEFORE 4:00 PM

## 2023-03-12 ENCOUNTER — Other Ambulatory Visit (HOSPITAL_COMMUNITY): Payer: Self-pay | Admitting: Cardiology

## 2023-03-12 MED ORDER — ATORVASTATIN CALCIUM 80 MG PO TABS: 80.0000 mg | ORAL_TABLET | Freq: Every day | ORAL | 6 refills | Status: DC

## 2023-03-18 ENCOUNTER — Encounter (HOSPITAL_COMMUNITY): Payer: Self-pay

## 2023-03-18 ENCOUNTER — Emergency Department (HOSPITAL_COMMUNITY): Payer: Medicaid Other

## 2023-03-18 ENCOUNTER — Inpatient Hospital Stay (HOSPITAL_COMMUNITY)
Admission: EM | Admit: 2023-03-18 | Discharge: 2023-03-22 | DRG: 194 | Disposition: A | Discharge status: Home / Self Care | Payer: Medicaid Other | Attending: Internal Medicine | Admitting: Internal Medicine

## 2023-03-18 ENCOUNTER — Other Ambulatory Visit: Payer: Self-pay

## 2023-03-18 DIAGNOSIS — R079 Chest pain, unspecified: Secondary | ICD-10-CM | POA: Diagnosis present

## 2023-03-18 DIAGNOSIS — I480 Paroxysmal atrial fibrillation: Secondary | ICD-10-CM | POA: Diagnosis present

## 2023-03-18 DIAGNOSIS — Z8719 Personal history of other diseases of the digestive system: Secondary | ICD-10-CM

## 2023-03-18 DIAGNOSIS — I25810 Atherosclerosis of coronary artery bypass graft(s) without angina pectoris: Secondary | ICD-10-CM | POA: Diagnosis present

## 2023-03-18 DIAGNOSIS — I255 Ischemic cardiomyopathy: Secondary | ICD-10-CM | POA: Diagnosis present

## 2023-03-18 DIAGNOSIS — I5022 Chronic systolic (congestive) heart failure: Secondary | ICD-10-CM | POA: Diagnosis present

## 2023-03-18 DIAGNOSIS — Z800 Family history of malignant neoplasm of digestive organs: Secondary | ICD-10-CM

## 2023-03-18 DIAGNOSIS — E7800 Pure hypercholesterolemia, unspecified: Secondary | ICD-10-CM | POA: Diagnosis present

## 2023-03-18 DIAGNOSIS — Z86718 Personal history of other venous thrombosis and embolism: Secondary | ICD-10-CM

## 2023-03-18 DIAGNOSIS — R1011 Right upper quadrant pain: Principal | ICD-10-CM | POA: Diagnosis present

## 2023-03-18 DIAGNOSIS — K5909 Other constipation: Secondary | ICD-10-CM | POA: Diagnosis present

## 2023-03-18 DIAGNOSIS — Z7901 Long term (current) use of anticoagulants: Secondary | ICD-10-CM

## 2023-03-18 DIAGNOSIS — Z79899 Other long term (current) drug therapy: Secondary | ICD-10-CM

## 2023-03-18 DIAGNOSIS — Z87891 Personal history of nicotine dependence: Secondary | ICD-10-CM

## 2023-03-18 DIAGNOSIS — K648 Other hemorrhoids: Secondary | ICD-10-CM | POA: Diagnosis present

## 2023-03-18 DIAGNOSIS — Z951 Presence of aortocoronary bypass graft: Secondary | ICD-10-CM

## 2023-03-18 DIAGNOSIS — K828 Other specified diseases of gallbladder: Secondary | ICD-10-CM | POA: Diagnosis present

## 2023-03-18 DIAGNOSIS — Z7984 Long term (current) use of oral hypoglycemic drugs: Secondary | ICD-10-CM

## 2023-03-18 DIAGNOSIS — I4892 Unspecified atrial flutter: Secondary | ICD-10-CM | POA: Diagnosis present

## 2023-03-18 DIAGNOSIS — I252 Old myocardial infarction: Secondary | ICD-10-CM

## 2023-03-18 DIAGNOSIS — Z885 Allergy status to narcotic agent status: Secondary | ICD-10-CM

## 2023-03-18 DIAGNOSIS — J189 Pneumonia, unspecified organism: Principal | ICD-10-CM | POA: Insufficient documentation

## 2023-03-18 DIAGNOSIS — K5900 Constipation, unspecified: Secondary | ICD-10-CM

## 2023-03-18 DIAGNOSIS — I82612 Acute embolism and thrombosis of superficial veins of left upper extremity: Secondary | ICD-10-CM | POA: Diagnosis not present

## 2023-03-18 DIAGNOSIS — I2510 Atherosclerotic heart disease of native coronary artery without angina pectoris: Secondary | ICD-10-CM | POA: Diagnosis present

## 2023-03-18 DIAGNOSIS — Z955 Presence of coronary angioplasty implant and graft: Secondary | ICD-10-CM

## 2023-03-18 DIAGNOSIS — R112 Nausea with vomiting, unspecified: Secondary | ICD-10-CM

## 2023-03-18 DIAGNOSIS — K921 Melena: Secondary | ICD-10-CM | POA: Diagnosis present

## 2023-03-18 LAB — CBC WITH DIFFERENTIAL/PLATELET
Abs Immature Granulocytes: 0.03 10*3/uL (ref 0.00–0.07)
Basophils Absolute: 0.1 10*3/uL (ref 0.0–0.1)
Basophils Relative: 1 %
Eosinophils Absolute: 0.3 10*3/uL (ref 0.0–0.5)
Eosinophils Relative: 3 %
HCT: 49.9 % (ref 39.0–52.0)
Hemoglobin: 16.1 g/dL (ref 13.0–17.0)
Immature Granulocytes: 0 %
Lymphocytes Relative: 23 %
Lymphs Abs: 2.4 10*3/uL (ref 0.7–4.0)
MCH: 30.2 pg (ref 26.0–34.0)
MCHC: 32.3 g/dL (ref 30.0–36.0)
MCV: 93.6 fL (ref 80.0–100.0)
Monocytes Absolute: 0.7 10*3/uL (ref 0.1–1.0)
Monocytes Relative: 7 %
Neutro Abs: 7 10*3/uL (ref 1.7–7.7)
Neutrophils Relative %: 66 %
Platelets: 253 10*3/uL (ref 150–400)
RBC: 5.33 MIL/uL (ref 4.22–5.81)
RDW: 13.9 % (ref 11.5–15.5)
WBC: 10.6 10*3/uL — ABNORMAL HIGH (ref 4.0–10.5)
nRBC: 0 % (ref 0.0–0.2)

## 2023-03-18 LAB — COMPREHENSIVE METABOLIC PANEL
ALT: 36 U/L (ref 0–44)
AST: 30 U/L (ref 15–41)
Albumin: 3.5 g/dL (ref 3.5–5.0)
Alkaline Phosphatase: 80 U/L (ref 38–126)
Anion gap: 11 (ref 5–15)
BUN: 22 mg/dL — ABNORMAL HIGH (ref 6–20)
CO2: 19 mmol/L — ABNORMAL LOW (ref 22–32)
Calcium: 9.5 mg/dL (ref 8.9–10.3)
Chloride: 107 mmol/L (ref 98–111)
Creatinine, Ser: 1.16 mg/dL (ref 0.61–1.24)
GFR, Estimated: >60 mL/min (ref >60)
Glucose, Bld: 86 mg/dL (ref 70–99)
Potassium: 5.1 mmol/L (ref 3.5–5.1)
Sodium: 137 mmol/L (ref 135–145)
Total Bilirubin: 0.7 mg/dL (ref 0.0–1.2)
Total Protein: 6.8 g/dL (ref 6.5–8.1)

## 2023-03-18 LAB — I-STAT CG4 LACTIC ACID, ED: Lactic Acid, Venous: 1.6 mmol/L (ref 0.5–1.9)

## 2023-03-18 LAB — LIPASE, BLOOD: Lipase: 27 U/L (ref 11–51)

## 2023-03-18 MED ORDER — ONDANSETRON 4 MG PO TBDP
4.0000 mg | ORAL_TABLET | Freq: Once | ORAL | Status: AC
  Administered 2023-03-18: 4 mg via ORAL
  Filled 2023-03-18: qty 1

## 2023-03-18 MED ORDER — OXYCODONE-ACETAMINOPHEN 5-325 MG PO TABS
2.0000 | ORAL_TABLET | Freq: Once | ORAL | Status: AC
  Administered 2023-03-18: 2 via ORAL
  Filled 2023-03-18: qty 2

## 2023-03-18 MED ORDER — HYDROMORPHONE HCL 1 MG/ML IJ SOLN
0.5000 mg | Freq: Once | INTRAMUSCULAR | Status: AC
Start: 2023-03-18 — End: 2023-03-19
  Administered 2023-03-19: 0.5 mg via INTRAVENOUS
  Filled 2023-03-18: qty 1

## 2023-03-18 MED ORDER — ONDANSETRON HCL 4 MG/2ML IJ SOLN
4.0000 mg | Freq: Once | INTRAMUSCULAR | Status: AC
  Administered 2023-03-19: 4 mg via INTRAVENOUS
  Filled 2023-03-18: qty 2

## 2023-03-18 NOTE — ED Provider Notes (Incomplete)
[place] EMERGENCY DEPARTMENT AT [facility] Provider Note   CSN: [id number] Arrival date & time: 03/18/23  1322    History {Add pertinent medical, surgical, social history, OB history to HPI:1} Chief Complaint  Patient presents with  . Abdominal Pain    Wyatt Donovan is a 38 y.o. male extensive cardiac history including CHF, CABG, multiple NSTEMI here for evaluation of abdominal pain.  Patient states he has epigastric and right upper quadrant abdominal pain.  This has been going on since Thanksgiving.  He is some chronic shortness of breath which remains unchanged.  Multiple episodes of NBNB emesis.  Occasional loose stool.  Said some chills without documented fever.  He has been seen multiple times emergency department without significant relief.  Had CT scan ultrasound 1 month ago which showed some shadowing in a duct, question a stone versus polyp.  He is anticoagulated.  He has been taking p.o. antibiotics, Zofran, pain medicine without significant improvement in pain.  Pain constant however waxes and wanes in intensity.  He denies any bloody stool.  No NSAID use, EtOH use.  He has tried diet changes including removing fatty, greasy foods.  Was seen by Dr. Dwain Sarna outpatient, plan was to have removed however needed cardiac clearance due to his history.  Seen at ED at [facility] yesterday was given additional pain medication, antiemetics.  He called surgery office today who recommended coming here due to his intractable pain.  No fever, chest pain, back pain, dysuria, hematuria, PND, orthopnea, cough.  03/16/23 @ AM- Last does of Eliquis     HPI     Home Medications Prior to Admission medications   Medication Sig Start Date End Date Taking? Authorizing Provider  apixaban (ELIQUIS) 5 MG TABS tablet Take 1 tablet (5 mg total) by mouth 2 (two) times daily. 10/24/22   Modena Slater, DO  atorvastatin (LIPITOR) 80 MG tablet Take 1 tablet (80 mg total) by mouth daily.  03/12/23   Laurey Morale, MD  carvedilol (COREG) 3.125 MG tablet TAKE ONE TABLET BY MOUTH TWICE DAILY WITH MEALS 05/04/22   Graciella Freer, PA-C  dapagliflozin propanediol (FARXIGA) 10 MG TABS tablet Take 1 tablet (10 mg total) by mouth daily before breakfast. 11/30/22   Laurey Morale, MD  digoxin (LANOXIN) 0.125 MG tablet Take 1 tablet (125 mcg total) by mouth daily. 02/22/23 02/22/24  Laurey Morale, MD  furosemide (LASIX) 20 MG tablet Take 20 mg by mouth daily.    [provider]  linaclotide Karlene Einstein) 290 MCG CAPS capsule Take 1 capsule (290 mcg total) by mouth daily before breakfast. 08/04/22   Imogene Burn, MD  nitroGLYCERIN (NITROSTAT) 0.4 MG SL tablet Place 1 tablet (0.4 mg total) under the tongue every 5 (five) minutes x 3 doses as needed for chest pain. 01/27/22   Laurey Morale, MD  Omega-3 Fatty Acids (FISH OIL) 500 MG CAPS Take 1,000 mg by mouth daily.    [provider]  ondansetron (ZOFRAN-ODT) 4 MG disintegrating tablet Take 4 mg by mouth every 8 (eight) hours as needed for nausea or vomiting.    [provider]  oxyCODONE-acetaminophen (PERCOCET/ROXICET) 5-325 MG tablet Take 1 tablet by mouth every 4 (four) hours as needed for severe pain (pain score 7-10). 02/18/23   Al Decant, PA-C  pantoprazole (PROTONIX) 40 MG tablet Take 1 tablet (40 mg total) by mouth daily. 12/02/22 02/17/24  Laurey Morale, MD  sacubitril-valsartan (ENTRESTO) 24-26 MG Take 1  tablet by mouth 2 (two) times daily. 11/23/22   Laurey Morale, MD  sodium zirconium cyclosilicate (LOKELMA) 5 g packet Take 5 g by mouth daily. 02/17/23   Laurey Morale, MD  spironolactone (ALDACTONE) 25 MG tablet Take 1 tablet (25 mg total) by mouth daily. 02/19/23   Jonita Albee, PA-C      Allergies    Codeine    Review of Systems   Review of Systems  Constitutional: Negative.   HENT: Negative.    Respiratory:  Positive for shortness of breath (chronic, unchanged  per patient).   Gastrointestinal:  Positive for abdominal pain, diarrhea, nausea and vomiting. Negative for abdominal distention, anal bleeding, blood in stool, constipation and rectal pain.  Genitourinary: Negative.   Musculoskeletal: Negative.   Skin: Negative.   Neurological: Negative.   All other systems reviewed and are negative.   Physical Exam Updated Vital Signs BP 105/77   Pulse 61   Temp 98.2 F (36.8 C) (Oral)   Resp 18   SpO2 96%  Physical Exam Vitals and nursing note reviewed.  Constitutional:      General: He is not in acute distress.    Appearance: He is ill-appearing (chronically ill appearing). He is not diaphoretic.  HENT:     Head: Atraumatic.  Eyes:     Pupils: Pupils are equal, round, and reactive to light.  Cardiovascular:     Rate and Rhythm: Normal rate and regular rhythm.     Heart sounds: Normal heart sounds.  Pulmonary:     Effort: Pulmonary effort is normal. No respiratory distress.     Breath sounds: Normal breath sounds.  Chest:     Comments: Old midline incision  Abdominal:     General: Bowel sounds are normal. There is no distension.     Palpations: Abdomen is soft.     Tenderness: There is abdominal tenderness in the right upper quadrant and epigastric area. There is no right CVA tenderness, left CVA tenderness, guarding or rebound. Negative signs include Murphy's sign and McBurney's sign.     Hernia: No hernia is present.  Musculoskeletal:        General: Normal range of motion.     Cervical back: Normal range of motion and neck supple.     Comments: Compartments soft, full rom  Skin:    General: Skin is warm and dry.  Neurological:     General: No focal deficit present.     Mental Status: He is alert and oriented to person, place, and time.     ED Results / Procedures / Treatments   Labs (all labs ordered are listed, but only abnormal results are displayed) Labs Reviewed  COMPREHENSIVE METABOLIC PANEL - Abnormal; Notable for  the following components:      Result Value   CO2 19 (*)    BUN 22 (*)    All other components within normal limits  CBC WITH DIFFERENTIAL/PLATELET - Abnormal; Notable for the following components:   WBC 10.6 (*)    All other components within normal limits  LIPASE, BLOOD  I-STAT CG4 LACTIC ACID, ED    EKG None  Radiology US ABDOMEN LIMITED RUQ (LIVER/GB) Result Date: 03/18/2023 CLINICAL DATA:  Right upper quadrant abdominal pain. EXAM: ULTRASOUND ABDOMEN LIMITED RIGHT UPPER QUADRANT COMPARISON:  Ultrasound dated 02/18/2023. FINDINGS: Gallbladder: No gallstones or wall thickening visualized. No sonographic Murphy sign noted by sonographer. Common bile duct: Diameter: 5 mm Liver: The liver is unremarkable. Portal vein is patent  on color Doppler imaging with normal direction of blood flow towards the liver. Other: None. IMPRESSION: Unremarkable right upper quadrant ultrasound. Electronically Signed   By: Elgie Collard M.D.   On: 03/18/2023 17:35    Procedures Procedures  {Document cardiac monitor, telemetry assessment procedure when appropriate:1}  Medications Ordered in ED Medications  oxyCODONE-acetaminophen (PERCOCET/ROXICET) 5-325 MG per tablet 2 tablet (2 tablets Oral Given 03/18/23 1626)  ondansetron (ZOFRAN-ODT) disintegrating tablet 4 mg (4 mg Oral Given 03/18/23 1626)    ED Course/ Medical Decision Making/ A&P   38 year old extensive medical history here for evaluation of abdominal pain.  Has had abdominal pain since things given, was 2 months ago.  Has had CT scan and ultrasound which showed a likely stone.  Was post to have removed in the outpatient setting however pain has increased despite p.o. analgesia, antibiotics and Zofran.  Now having nausea and vomiting.  Pain located epigastric, right upper quadrant, right lower chest wall.  States he is some chronic shortness of breath which is unchanged.  No chest pain.  He does not appear grossly fluid overloaded.  He does have  pacemaker {   Click here for ABCD2, HEART and other calculatorsREFRESH Note before signing :1}                              Medical Decision Making    {Document critical care time when appropriate:1} {Document review of labs and clinical decision tools ie heart score, Chads2Vasc2 etc:1}  {Document your independent review of radiology images, and any outside records:1} {Document your discussion with family members, caretakers, and with consultants:1} {Document social determinants of health affecting pt's care:1} {Document your decision making why or why not admission, treatments were needed:1} Final Clinical Impression(s) / ED Diagnoses Final diagnoses:  None    Rx / DC Orders ED Discharge Orders     None

## 2023-03-18 NOTE — ED Triage Notes (Signed)
Pt c.o RUQ pain, scheduled for chole on 1/29 but had increased pain, [facility] told him to come here. Pt at [facility] yesterday for the same

## 2023-03-18 NOTE — ED Provider Notes (Signed)
[place] EMERGENCY DEPARTMENT AT [facility] Provider Note   CSN: [id number] Arrival date & time: 03/18/23  1322    History  Chief Complaint  Patient presents with   Abdominal Pain    Wyatt Donovan is a 38 y.o. male extensive cardiac history including CHF, CABG, multiple NSTEMI here for evaluation of abdominal pain.  Patient states he has epigastric and right upper quadrant abdominal pain.  This has been going on since Thanksgiving.  He is some chronic shortness of breath which remains unchanged.  Multiple episodes of NBNB emesis.  Occasional loose stool.  Said some chills without documented fever.  He has been seen multiple times emergency department without significant relief.  Had CT scan ultrasound 1 month ago which showed some shadowing in a duct, question a stone versus polyp.  He is anticoagulated.  He has been taking p.o. antibiotics, Zofran, pain medicine without significant improvement in pain.  Pain constant however waxes and wanes in intensity.  He denies any bloody stool.  No NSAID use, EtOH use.  He has tried diet changes including removing fatty, greasy foods.  Was seen by Dr. Dwain Sarna outpatient, plan was to have removed however needed cardiac clearance due to his history.  Se[facility] ED at [facility] yesterday was given additional pain medication, antiemetics.  He called surgery office today who recommended coming here due to his intractable pain.  No fever, chest pain, back pain, dysuria, hematuria, PND, orthopnea, cough.  03/16/23 @ AM- Last does of Eliquis     HPI     Home Medications Prior to Admission medications   Medication Sig Start Date End Date Taking? Authorizing Provider  apixaban (ELIQUIS) 5 MG TABS tablet Take 1 tablet (5 mg total) by mouth 2 (two) times daily. 10/24/22  Yes Modena Slater, DO  atorvastatin (LIPITOR) 80 MG tablet Take 1 tablet (80 mg total) by mouth daily. 03/12/23  Yes Laurey Morale, MD  carvedilol (COREG) 3.125 MG tablet  TAKE ONE TABLET BY MOUTH TWICE DAILY WITH MEALS 05/04/22  Yes Graciella Freer, PA-C  dapagliflozin propanediol (FARXIGA) 10 MG TABS tablet Take 1 tablet (10 mg total) by mouth daily before breakfast. 11/30/22  Yes Laurey Morale, MD  digoxin (LANOXIN) 0.125 MG tablet Take 1 tablet (125 mcg total) by mouth daily. 02/22/23 02/22/24 Yes Laurey Morale, MD  furosemide (LASIX) 20 MG tablet Take 20 mg by mouth daily.   Yes [provider]  linaclotide (LINZESS) 290 MCG CAPS capsule Take 1 capsule (290 mcg total) by mouth daily before breakfast. Patient taking differently: Take 290 mcg by mouth daily as needed (for constipation). 08/04/22  Yes Imogene Burn, MD  nitroGLYCERIN (NITROSTAT) 0.4 MG SL tablet Place 1 tablet (0.4 mg total) under the tongue every 5 (five) minutes x 3 doses as needed for chest pain. 01/27/22  Yes Laurey Morale, MD  Omega-3 Fatty Acids (FISH OIL) 500 MG CAPS Take 2,000 mg by mouth daily.   Yes [provider]  ondansetron (ZOFRAN-ODT) 4 MG disintegrating tablet Take 4 mg by mouth every 8 (eight) hours as needed for nausea or vomiting.   Yes [provider]  oxyCODONE-acetaminophen (PERCOCET/ROXICET) 5-325 MG tablet Take 1 tablet by mouth every 4 (four) hours as needed for severe pain (pain score 7-10). 02/18/23  Yes Al Decant, PA-C  pantoprazole (PROTONIX) 40 MG tablet Take 1 tablet (40 mg total) by mouth daily. 12/02/22 02/17/24 Yes Laurey Morale, MD  sacubitril-valsartan Sherryll Burger)  24-26 MG Take 1 tablet by mouth 2 (two) times daily. 11/23/22  Yes Laurey Morale, MD  sodium zirconium cyclosilicate (LOKELMA) 5 g packet Take 5 g by mouth daily. 02/17/23  Yes Laurey Morale, MD  spironolactone (ALDACTONE) 25 MG tablet Take 1 tablet (25 mg total) by mouth daily. 02/19/23  Yes Jonita Albee, PA-C      Allergies    Codeine    Review of Systems   Review of Systems  Constitutional: Negative.   HENT: Negative.     Respiratory:  Positive for shortness of breath (chronic, unchanged per patient).   Gastrointestinal:  Positive for abdominal pain, diarrhea, nausea and vomiting. Negative for abdominal distention, anal bleeding, blood in stool, constipation and rectal pain.  Genitourinary: Negative.   Musculoskeletal: Negative.   Skin: Negative.   Neurological: Negative.   All other systems reviewed and are negative.   Physical Exam Updated Vital Signs BP 113/89 (BP Location: Left Arm)   Pulse 67   Temp 97.8 F (36.6 C) (Oral)   Resp 10   SpO2 92%  Physical Exam Vitals and nursing note reviewed.  Constitutional:      General: He is not in acute distress.    Appearance: He is ill-appearing (chronically ill appearing). He is not diaphoretic.  HENT:     Head: Atraumatic.  Eyes:     Pupils: Pupils are equal, round, and reactive to light.  Cardiovascular:     Rate and Rhythm: Normal rate and regular rhythm.     Heart sounds: Normal heart sounds.  Pulmonary:     Effort: Pulmonary effort is normal. No respiratory distress.     Breath sounds: Normal breath sounds.  Chest:     Comments: Old midline incision  Abdominal:     General: Bowel sounds are normal. There is no distension.     Palpations: Abdomen is soft.     Tenderness: There is abdominal tenderness in the right upper quadrant and epigastric area. There is no right CVA tenderness, left CVA tenderness, guarding or rebound. Negative signs include Murphy's sign and McBurney's sign.     Hernia: No hernia is present.  Musculoskeletal:        General: Normal range of motion.     Cervical back: Normal range of motion and neck supple.     Comments: Compartments soft, full rom  Skin:    General: Skin is warm and dry.  Neurological:     General: No focal deficit present.     Mental Status: He is alert and oriented to person, place, and time.     ED Results / Procedures / Treatments   Labs (all labs ordered are listed, but only abnormal  results are displayed) Labs Reviewed  COMPREHENSIVE METABOLIC PANEL - Abnormal; Notable for the following components:      Result Value   CO2 19 (*)    BUN 22 (*)    All other components within normal limits  CBC WITH DIFFERENTIAL/PLATELET - Abnormal; Notable for the following components:   WBC 10.6 (*)    All other components within normal limits  LIPASE, BLOOD  I-STAT CG4 LACTIC ACID, ED    EKG None  Radiology CT Angio Chest PE W and/or Wo Contrast Result Date: 03/19/2023 CLINICAL DATA:  Shortness of breath, vomiting, right upper quadrant pain. EXAM: CT ANGIOGRAPHY CHEST CT ABDOMEN AND PELVIS WITH CONTRAST TECHNIQUE: Multidetector CT imaging of the chest was performed using the standard protocol during bolus administration of intravenous  contrast. Multiplanar CT image reconstructions and MIPs were obtained to evaluate the vascular anatomy. Multidetector CT imaging of the abdomen and pelvis was performed using the standard protocol during bolus administration of intravenous contrast. RADIATION DOSE REDUCTION: This exam was performed according to the departmental dose-optimization program which includes automated exposure control, adjustment of the mA and/or kV according to patient size and/or use of iterative reconstruction technique. CONTRAST:  75mL OMNIPAQUE IOHEXOL 350 MG/ML SOLN COMPARISON:  Right upper quadrant ultrasound dated 03/18/2023. CT abdomen/pelvis dated 02/14/2023. FINDINGS: CTA CHEST FINDINGS Cardiovascular: Satisfactory opacification of the bilateral pulmonary arteries to the segmental level. No evidence of pulmonary embolism. Although not tailored for evaluation of the thoracic aorta, there is no evidence of thoracic aortic aneurysm or dissection. Heart is normal in size.  No pericardial effusion. Mild three-vessel coronary atherosclerosis. Postsurgical changes related to prior CABG. Mediastinum/Nodes: No suspicious mediastinal lymphadenopathy. Visualized thyroid is  unremarkable. Lungs/Pleura: Mild patchy/nodular opacities in the posterior right upper lobe (series 4/image 48), suggesting mild infection/pneumonia. No suspicious pulmonary nodules. No pleural effusion or pneumothorax. Musculoskeletal: Visualized osseous structures are within normal limits. Median sternotomy. Review of the MIP images confirms the above findings. CT ABDOMEN and PELVIS FINDINGS Hepatobiliary: Liver is within normal limits. Gallbladder is unremarkable. No intrahepatic or extrahepatic ductal dilatation. Pancreas: Within normal limits. Spleen: Within normal limits Adrenals/Urinary Tract: Adrenal glands are within normal limits. Kidneys are within normal limits.  No hydronephrosis. Bladder is within normal limits. Stomach/Bowel: Stomach is notable for a tiny hiatal hernia. No evidence of bowel obstruction. Normal appendix (series 6/image 31). No colonic wall thickening or inflammatory changes. Vascular/Lymphatic: No evidence of abdominal aortic aneurysm. No suspicious abdominopelvic lymphadenopathy. Reproductive: Prostate is unremarkable. Other: No abdominopelvic ascites. Small left and trace right fat containing inguinal hernias. Musculoskeletal: Visualized osseous structures are within normal limits. Review of the MIP images confirms the above findings. IMPRESSION: No evidence of pulmonary embolism. Mild right upper lobe pneumonia. Gallbladder is within normal limits on CT. Electronically Signed   By: Charline Bills M.D.   On: 03/19/2023 02:04   CT ABDOMEN PELVIS W CONTRAST Result Date: 03/19/2023 CLINICAL DATA:  Shortness of breath, vomiting, right upper quadrant pain. EXAM: CT ANGIOGRAPHY CHEST CT ABDOMEN AND PELVIS WITH CONTRAST TECHNIQUE: Multidetector CT imaging of the chest was performed using the standard protocol during bolus administration of intravenous contrast. Multiplanar CT image reconstructions and MIPs were obtained to evaluate the vascular anatomy. Multidetector CT imaging of the  abdomen and pelvis was performed using the standard protocol during bolus administration of intravenous contrast. RADIATION DOSE REDUCTION: This exam was performed according to the departmental dose-optimization program which includes automated exposure control, adjustment of the mA and/or kV according to patient size and/or use of iterative reconstruction technique. CONTRAST:  75mL OMNIPAQUE IOHEXOL 350 MG/ML SOLN COMPARISON:  Right upper quadrant ultrasound dated 03/18/2023. CT abdomen/pelvis dated 02/14/2023. FINDINGS: CTA CHEST FINDINGS Cardiovascular: Satisfactory opacification of the bilateral pulmonary arteries to the segmental level. No evidence of pulmonary embolism. Although not tailored for evaluation of the thoracic aorta, there is no evidence of thoracic aortic aneurysm or dissection. Heart is normal in size.  No pericardial effusion. Mild three-vessel coronary atherosclerosis. Postsurgical changes related to prior CABG. Mediastinum/Nodes: No suspicious mediastinal lymphadenopathy. Visualized thyroid is unremarkable. Lungs/Pleura: Mild patchy/nodular opacities in the posterior right upper lobe (series 4/image 48), suggesting mild infection/pneumonia. No suspicious pulmonary nodules. No pleural effusion or pneumothorax. Musculoskeletal: Visualized osseous structures are within normal limits. Median sternotomy. Review of the MIP images confirms  the above findings. CT ABDOMEN and PELVIS FINDINGS Hepatobiliary: Liver is within normal limits. Gallbladder is unremarkable. No intrahepatic or extrahepatic ductal dilatation. Pancreas: Within normal limits. Spleen: Within normal limits Adrenals/Urinary Tract: Adrenal glands are within normal limits. Kidneys are within normal limits.  No hydronephrosis. Bladder is within normal limits. Stomach/Bowel: Stomach is notable for a tiny hiatal hernia. No evidence of bowel obstruction. Normal appendix (series 6/image 31). No colonic wall thickening or inflammatory  changes. Vascular/Lymphatic: No evidence of abdominal aortic aneurysm. No suspicious abdominopelvic lymphadenopathy. Reproductive: Prostate is unremarkable. Other: No abdominopelvic ascites. Small left and trace right fat containing inguinal hernias. Musculoskeletal: Visualized osseous structures are within normal limits. Review of the MIP images confirms the above findings. IMPRESSION: No evidence of pulmonary embolism. Mild right upper lobe pneumonia. Gallbladder is within normal limits on CT. Electronically Signed   By: Charline Bills M.D.   On: 03/19/2023 02:04   US ABDOMEN LIMITED RUQ (LIVER/GB) Result Date: 03/18/2023 CLINICAL DATA:  Right upper quadrant abdominal pain. EXAM: ULTRASOUND ABDOMEN LIMITED RIGHT UPPER QUADRANT COMPARISON:  Ultrasound dated 02/18/2023. FINDINGS: Gallbladder: No gallstones or wall thickening visualized. No sonographic Murphy sign noted by sonographer. Common bile duct: Diameter: 5 mm Liver: The liver is unremarkable. Portal vein is patent on color Doppler imaging with normal direction of blood flow towards the liver. Other: None. IMPRESSION: Unremarkable right upper quadrant ultrasound. Electronically Signed   By: Elgie Collard M.D.   On: 03/18/2023 17:35    Procedures Procedures    Medications Ordered in ED Medications  cefTRIAXone (ROCEPHIN) 1 g in sodium chloride 0.9 % 100 mL IVPB (1 g Intravenous New Bag/Given 03/19/23 0253)  azithromycin (ZITHROMAX) 500 mg in sodium chloride 0.9 % 250 mL IVPB (has no administration in time range)  oxyCODONE-acetaminophen (PERCOCET/ROXICET) 5-325 MG per tablet 2 tablet (2 tablets Oral Given 03/18/23 1626)  ondansetron (ZOFRAN-ODT) disintegrating tablet 4 mg (4 mg Oral Given 03/18/23 1626)  HYDROmorphone (DILAUDID) injection 0.5 mg (0.5 mg Intravenous Given 03/19/23 0028)  ondansetron (ZOFRAN) injection 4 mg (4 mg Intravenous Given 03/19/23 0025)  iohexol (OMNIPAQUE) 350 MG/ML injection 75 mL (75 mLs Intravenous Contrast Given  03/19/23 0113)  HYDROmorphone (DILAUDID) injection 0.5 mg (0.5 mg Intravenous Given 03/19/23 0253)   ED Course/ Medical Decision Making/ A&P   38 year old extensive medical history here for evaluation of abdominal pain.  Has had abdominal pain since things given, was 2 months ago.  Has had CT scan and ultrasound which showed a likely stone.  Was post to have removed in the outpatient setting however pain has increased despite p.o. analgesia, antibiotics and Zofran.  Now having nausea and vomiting.  Pain located epigastric, right upper quadrant, right lower chest wall.  States he is some chronic shortness of breath which is unchanged.  No chest pain.  He does not appear grossly fluid overloaded.  He does have pacemaker/ICD.  Labs and imaging personally viewed and interpreted:  CBC leukocytosis 10.6 CMP without significant abnormality Lipase 27 Lactic 1.6  CT abdomen pelvis without acute abnormality CTA with a right upper lobe pneumonia--patient states he has chronic shortness of breath and chronic cough, no fever, no changes with this.  Will treat with antibiotics  CONSULT with Dr.  Janee Morn with gen surgery. Rec medicine admit. HIDA scan and surgery will consult. Hold DOAC  Reassessed, discussed plan for admission with HIDA scan in the morning.  He will be made NPO.  He is requesting additional pain management.  Additional Dilaudid ordered.  Discussed pneumonia  on his CT scan he does state he has a chronic cough which is unchanged.  Will treat with antibiotics at this time.  Patient and father agreeable for admission for further workup and management  CONSULT with Dr. Toniann Fail with medicine who is agreeable to evaluate patient for admission.  The patient appears reasonably stabilized for admission considering the current resources, flow, and capabilities available in the ED at this time, and I doubt any other [facility] requiring further screening and/or treatment in the ED prior to admission.                                  Medical Decision Making Amount and/or Complexity of Data Reviewed Independent Historian: parent External Data Reviewed: labs, radiology, ECG and notes. Labs: ordered. Decision-making details documented in ED Course. Radiology: ordered and independent interpretation performed. Decision-making details documented in ED Course. ECG/medicine tests: ordered and independent interpretation performed. Decision-making details documented in ED Course.  Risk OTC drugs. Prescription drug management. Parenteral controlled substances. Decision regarding hospitalization. Diagnosis or treatment significantly limited by social determinants of health.          Final Clinical Impression(s) / ED Diagnoses Final diagnoses:  RUQ pain  Pneumonia of right lung due to infectious organism, unspecified part of lung  Chronic anticoagulation  Nausea and vomiting, unspecified vomiting type    Rx / DC Orders ED Discharge Orders     None         Valerian Jewel A, PA-C 03/19/23 0317    Sabas [place], MD 03/19/23 [phone]

## 2023-03-18 NOTE — ED Provider Triage Note (Signed)
Emergency Medicine Provider Triage Evaluation Note  Wyatt Donovan , a 38 y.o. male  was evaluated in triage.  Pt complains of severe right upper quadrant abdominal pain.  Patient recently noticed with cholelithiasis without cholecystitis.  He has a planned surgery.  He had onset of severe epigastric abdominal pain in the right upper quadrant epigastrium rating to his back with intractable vomiting over the last 24 hours and states "I am scheduled for surgery on the 29th but I do not think and go to make it."..  Review of Systems  Positive: Severe right upper quadrant abdominal pain Negative: Diarrhea  Physical Exam  BP 92/66   Pulse 64   Temp 97.6 F (36.4 C) (Oral)   Resp 18   SpO2 99%  Gen:   Awake, no distress   Resp:  Normal effort  MSK:   Moves extremities without difficulty  Other:    Medical Decision Making  Medically screening exam initiated at 4:21 PM.  Appropriate orders placed.  Micahel L Dible was informed that the remainder of the evaluation will be completed by another provider, this initial triage assessment does not replace that evaluation, and the importance of remaining in the ED until their evaluation is complete.     Arthor Captain, PA-C 03/18/23 1635

## 2023-03-19 ENCOUNTER — Encounter (HOSPITAL_COMMUNITY): Payer: Self-pay | Admitting: Internal Medicine

## 2023-03-19 ENCOUNTER — Emergency Department (HOSPITAL_COMMUNITY): Payer: Medicaid Other

## 2023-03-19 ENCOUNTER — Observation Stay (HOSPITAL_COMMUNITY): Payer: Medicaid Other

## 2023-03-19 DIAGNOSIS — I5022 Chronic systolic (congestive) heart failure: Secondary | ICD-10-CM | POA: Diagnosis present

## 2023-03-19 DIAGNOSIS — R112 Nausea with vomiting, unspecified: Secondary | ICD-10-CM | POA: Diagnosis present

## 2023-03-19 DIAGNOSIS — Z7901 Long term (current) use of anticoagulants: Secondary | ICD-10-CM | POA: Diagnosis not present

## 2023-03-19 DIAGNOSIS — Z951 Presence of aortocoronary bypass graft: Secondary | ICD-10-CM | POA: Diagnosis not present

## 2023-03-19 DIAGNOSIS — J189 Pneumonia, unspecified organism: Secondary | ICD-10-CM | POA: Insufficient documentation

## 2023-03-19 DIAGNOSIS — K5909 Other constipation: Secondary | ICD-10-CM | POA: Diagnosis present

## 2023-03-19 DIAGNOSIS — I255 Ischemic cardiomyopathy: Secondary | ICD-10-CM | POA: Diagnosis present

## 2023-03-19 DIAGNOSIS — I252 Old myocardial infarction: Secondary | ICD-10-CM | POA: Diagnosis not present

## 2023-03-19 DIAGNOSIS — Z86718 Personal history of other venous thrombosis and embolism: Secondary | ICD-10-CM | POA: Diagnosis not present

## 2023-03-19 DIAGNOSIS — E7800 Pure hypercholesterolemia, unspecified: Secondary | ICD-10-CM | POA: Diagnosis present

## 2023-03-19 DIAGNOSIS — R079 Chest pain, unspecified: Secondary | ICD-10-CM

## 2023-03-19 DIAGNOSIS — I4892 Unspecified atrial flutter: Secondary | ICD-10-CM | POA: Diagnosis present

## 2023-03-19 DIAGNOSIS — K648 Other hemorrhoids: Secondary | ICD-10-CM | POA: Diagnosis present

## 2023-03-19 DIAGNOSIS — Z885 Allergy status to narcotic agent status: Secondary | ICD-10-CM | POA: Diagnosis not present

## 2023-03-19 DIAGNOSIS — R1011 Right upper quadrant pain: Principal | ICD-10-CM | POA: Diagnosis present

## 2023-03-19 DIAGNOSIS — I2510 Atherosclerotic heart disease of native coronary artery without angina pectoris: Secondary | ICD-10-CM | POA: Diagnosis present

## 2023-03-19 DIAGNOSIS — I480 Paroxysmal atrial fibrillation: Secondary | ICD-10-CM | POA: Diagnosis present

## 2023-03-19 DIAGNOSIS — K921 Melena: Secondary | ICD-10-CM | POA: Diagnosis present

## 2023-03-19 DIAGNOSIS — I25810 Atherosclerosis of coronary artery bypass graft(s) without angina pectoris: Secondary | ICD-10-CM | POA: Diagnosis present

## 2023-03-19 DIAGNOSIS — M79602 Pain in left arm: Secondary | ICD-10-CM | POA: Diagnosis not present

## 2023-03-19 DIAGNOSIS — Z800 Family history of malignant neoplasm of digestive organs: Secondary | ICD-10-CM | POA: Diagnosis not present

## 2023-03-19 DIAGNOSIS — Z7984 Long term (current) use of oral hypoglycemic drugs: Secondary | ICD-10-CM | POA: Diagnosis not present

## 2023-03-19 DIAGNOSIS — Z79899 Other long term (current) drug therapy: Secondary | ICD-10-CM | POA: Diagnosis not present

## 2023-03-19 DIAGNOSIS — K5900 Constipation, unspecified: Secondary | ICD-10-CM | POA: Diagnosis not present

## 2023-03-19 DIAGNOSIS — I82612 Acute embolism and thrombosis of superficial veins of left upper extremity: Secondary | ICD-10-CM | POA: Diagnosis not present

## 2023-03-19 DIAGNOSIS — Z87891 Personal history of nicotine dependence: Secondary | ICD-10-CM | POA: Diagnosis not present

## 2023-03-19 DIAGNOSIS — Z955 Presence of coronary angioplasty implant and graft: Secondary | ICD-10-CM | POA: Diagnosis not present

## 2023-03-19 LAB — CBC WITH DIFFERENTIAL/PLATELET
Abs Immature Granulocytes: 0.02 10*3/uL (ref 0.00–0.07)
Basophils Absolute: 0.1 10*3/uL (ref 0.0–0.1)
Basophils Relative: 1 %
Eosinophils Absolute: 0.4 10*3/uL (ref 0.0–0.5)
Eosinophils Relative: 4 %
HCT: 45.7 % (ref 39.0–52.0)
Hemoglobin: 14.5 g/dL (ref 13.0–17.0)
Immature Granulocytes: 0 %
Lymphocytes Relative: 30 %
Lymphs Abs: 2.6 10*3/uL (ref 0.7–4.0)
MCH: 29.8 pg (ref 26.0–34.0)
MCHC: 31.7 g/dL (ref 30.0–36.0)
MCV: 93.8 fL (ref 80.0–100.0)
Monocytes Absolute: 0.8 10*3/uL (ref 0.1–1.0)
Monocytes Relative: 9 %
Neutro Abs: 5 10*3/uL (ref 1.7–7.7)
Neutrophils Relative %: 56 %
Platelets: 194 10*3/uL (ref 150–400)
RBC: 4.87 MIL/uL (ref 4.22–5.81)
RDW: 13.7 % (ref 11.5–15.5)
WBC: 8.8 10*3/uL (ref 4.0–10.5)
nRBC: 0 % (ref 0.0–0.2)

## 2023-03-19 LAB — HEPATIC FUNCTION PANEL
ALT: 28 U/L (ref 0–44)
AST: 25 U/L (ref 15–41)
Albumin: 3.3 g/dL — ABNORMAL LOW (ref 3.5–5.0)
Alkaline Phosphatase: 75 U/L (ref 38–126)
Bilirubin, Direct: 0.1 mg/dL (ref 0.0–0.2)
Indirect Bilirubin: 0.7 mg/dL (ref 0.3–0.9)
Total Bilirubin: 0.8 mg/dL (ref 0.0–1.2)
Total Protein: 6.2 g/dL — ABNORMAL LOW (ref 6.5–8.1)

## 2023-03-19 LAB — BASIC METABOLIC PANEL
Anion gap: 11 (ref 5–15)
BUN: 17 mg/dL (ref 6–20)
CO2: 24 mmol/L (ref 22–32)
Calcium: 9 mg/dL (ref 8.9–10.3)
Chloride: 103 mmol/L (ref 98–111)
Creatinine, Ser: 1.09 mg/dL (ref 0.61–1.24)
GFR, Estimated: >60 mL/min (ref >60)
Glucose, Bld: 89 mg/dL (ref 70–99)
Potassium: 4.2 mmol/L (ref 3.5–5.1)
Sodium: 138 mmol/L (ref 135–145)

## 2023-03-19 LAB — PROCALCITONIN: Procalcitonin: <0.1 ng/mL

## 2023-03-19 LAB — DIGOXIN LEVEL: Digoxin Level: 1.3 ng/mL (ref 0.8–2.0)

## 2023-03-19 LAB — HIV ANTIBODY (ROUTINE TESTING W REFLEX): HIV Screen 4th Generation wRfx: NONREACTIVE

## 2023-03-19 LAB — TROPONIN I (HIGH SENSITIVITY)
Troponin I (High Sensitivity): 27 ng/L — ABNORMAL HIGH (ref <18)
Troponin I (High Sensitivity): 23 ng/L — ABNORMAL HIGH (ref <18)

## 2023-03-19 LAB — MAGNESIUM: Magnesium: 2.2 mg/dL (ref 1.7–2.4)

## 2023-03-19 MED ORDER — PANTOPRAZOLE SODIUM 40 MG PO TBEC: 40.0000 mg | DELAYED_RELEASE_TABLET | Freq: Every day | ORAL | Status: DC

## 2023-03-19 MED ORDER — SODIUM CHLORIDE 0.9 % IV SOLN
100.0000 mg | Freq: Two times a day (BID) | INTRAVENOUS | Status: DC
  Administered 2023-03-20 – 2023-03-22 (×5): 100 mg via INTRAVENOUS
  Filled 2023-03-19 (×6): qty 100

## 2023-03-19 MED ORDER — SUCRALFATE 1 GM/10ML PO SUSP: 1.0000 g | Freq: Three times a day (TID) | ORAL | Status: DC

## 2023-03-19 MED ORDER — CALCIUM CARBONATE ANTACID 500 MG PO CHEW
2.0000 | CHEWABLE_TABLET | ORAL | Status: DC | PRN
  Administered 2023-03-21: 400 mg via ORAL
  Filled 2023-03-19: qty 2

## 2023-03-19 MED ORDER — TECHNETIUM TC 99M MEBROFENIN IV KIT
5.4000 | PACK | Freq: Once | INTRAVENOUS | Status: AC | PRN
  Administered 2023-03-19: 5.4 via INTRAVENOUS

## 2023-03-19 MED ORDER — ATORVASTATIN CALCIUM 80 MG PO TABS: 80.0000 mg | ORAL_TABLET | Freq: Every day | ORAL | Status: DC

## 2023-03-19 MED ORDER — NITROGLYCERIN 0.4 MG SL SUBL
SUBLINGUAL_TABLET | SUBLINGUAL | Status: AC
  Filled 2023-03-19: qty 3

## 2023-03-19 MED ORDER — NITROGLYCERIN 0.4 MG SL SUBL
0.4000 mg | SUBLINGUAL_TABLET | SUBLINGUAL | Status: AC | PRN
  Administered 2023-03-19: 0.4 mg via SUBLINGUAL

## 2023-03-19 MED ORDER — LACTULOSE ENEMA
300.0000 mL | Freq: Once | ORAL | Status: DC | PRN
  Filled 2023-03-19: qty 300

## 2023-03-19 MED ORDER — PANTOPRAZOLE SODIUM 40 MG IV SOLR
40.0000 mg | Freq: Two times a day (BID) | INTRAVENOUS | Status: DC
  Administered 2023-03-19 – 2023-03-21 (×5): 40 mg via INTRAVENOUS
  Filled 2023-03-19 (×5): qty 10

## 2023-03-19 MED ORDER — FUROSEMIDE 20 MG PO TABS: 20.0000 mg | ORAL_TABLET | Freq: Every day | ORAL | Status: DC

## 2023-03-19 MED ORDER — HYDROMORPHONE HCL 1 MG/ML IJ SOLN
0.5000 mg | INTRAMUSCULAR | Status: DC | PRN
  Administered 2023-03-19 – 2023-03-22 (×12): 0.5 mg via INTRAVENOUS
  Filled 2023-03-19 (×12): qty 1

## 2023-03-19 MED ORDER — SODIUM CHLORIDE 0.9 % IV SOLN
2.0000 g | INTRAVENOUS | Status: DC
  Administered 2023-03-20 – 2023-03-22 (×3): 2 g via INTRAVENOUS
  Filled 2023-03-19 (×3): qty 20

## 2023-03-19 MED ORDER — OMEGA-3-ACID ETHYL ESTERS 1 G PO CAPS
2000.0000 mg | ORAL_CAPSULE | Freq: Every day | ORAL | Status: DC
  Administered 2023-03-19 – 2023-03-22 (×4): 2000 mg via ORAL
  Filled 2023-03-19 (×4): qty 2

## 2023-03-19 MED ORDER — IOHEXOL 350 MG/ML SOLN
75.0000 mL | Freq: Once | INTRAVENOUS | Status: AC | PRN
  Administered 2023-03-19: 75 mL via INTRAVENOUS

## 2023-03-19 MED ORDER — SACUBITRIL-VALSARTAN 24-26 MG PO TABS
1.0000 | ORAL_TABLET | Freq: Two times a day (BID) | ORAL | Status: DC
  Administered 2023-03-19 – 2023-03-22 (×5): 1 via ORAL
  Filled 2023-03-19 (×6): qty 1

## 2023-03-19 MED ORDER — BISACODYL 10 MG RE SUPP: 10.0000 mg | Freq: Every day | RECTAL | Status: DC | PRN

## 2023-03-19 MED ORDER — HYDROMORPHONE HCL 1 MG/ML IJ SOLN
0.2000 mg | INTRAMUSCULAR | Status: AC | PRN
Start: 2023-03-19 — End: 2023-03-19
  Administered 2023-03-19 (×2): 0.2 mg via INTRAVENOUS
  Filled 2023-03-19 (×2): qty 1

## 2023-03-19 MED ORDER — HYDROMORPHONE HCL 1 MG/ML IJ SOLN
0.5000 mg | Freq: Once | INTRAMUSCULAR | Status: AC
Start: 2023-03-19 — End: 2023-03-19
  Administered 2023-03-19: 0.5 mg via INTRAVENOUS
  Filled 2023-03-19: qty 1

## 2023-03-19 MED ORDER — CARVEDILOL 3.125 MG PO TABS
3.1250 mg | ORAL_TABLET | Freq: Two times a day (BID) | ORAL | Status: DC
  Administered 2023-03-19 – 2023-03-22 (×7): 3.125 mg via ORAL
  Filled 2023-03-19 (×7): qty 1

## 2023-03-19 MED ORDER — SUCRALFATE 1 GM/10ML PO SUSP
1.0000 g | Freq: Three times a day (TID) | ORAL | Status: AC
  Administered 2023-03-19: 1 g via ORAL
  Filled 2023-03-19 (×3): qty 10

## 2023-03-19 MED ORDER — SPIRONOLACTONE 25 MG PO TABS
25.0000 mg | ORAL_TABLET | Freq: Every day | ORAL | Status: DC
  Administered 2023-03-19 – 2023-03-22 (×4): 25 mg via ORAL
  Filled 2023-03-19 (×4): qty 1

## 2023-03-19 MED ORDER — SODIUM CHLORIDE 0.9 % IV SOLN
1.0000 g | Freq: Once | INTRAVENOUS | Status: AC
  Administered 2023-03-19: 1 g via INTRAVENOUS
  Filled 2023-03-19: qty 10

## 2023-03-19 MED ORDER — SODIUM CHLORIDE 0.9 % IV SOLN
500.0000 mg | Freq: Once | INTRAVENOUS | Status: AC
  Administered 2023-03-19: 500 mg via INTRAVENOUS
  Filled 2023-03-19: qty 5

## 2023-03-19 MED ORDER — SODIUM ZIRCONIUM CYCLOSILICATE 5 G PO PACK
5.0000 g | PACK | Freq: Every day | ORAL | Status: DC
  Administered 2023-03-20: 5 g via ORAL
  Filled 2023-03-19 (×4): qty 1

## 2023-03-19 MED ORDER — DIGOXIN 125 MCG PO TABS
125.0000 ug | ORAL_TABLET | Freq: Every day | ORAL | Status: DC
  Administered 2023-03-19 – 2023-03-22 (×4): 125 ug via ORAL
  Filled 2023-03-19 (×4): qty 1

## 2023-03-19 NOTE — Progress Notes (Signed)
Patient's HIDA scan is negative for cholecystitis or biliary dyskinesia.  No needs for lap chole based on all findings at this time.  Will defer further care to the primary service, we will sign off.  Letha Cape 2:49 PM 03/19/2023

## 2023-03-19 NOTE — ED Notes (Signed)
Pt c/o 8/10 chest pain and some SOB. Captured EKG and notified MD Toniann Fail.

## 2023-03-19 NOTE — Consult Note (Signed)
Reason for Consult:RUQ pain Referring Physician: Britni Henderly  Wyatt Donovan is an 38 y.o. male.  HPI: 38yo M with a significant cardiac history including early onset CAD with multiple MIs, PCI, status post CABG and currently managed by the heart failure team who was seen by Dr. Dwain Sarna from our practice on 03/12/2023 regarding gallstones and chronic cholecystitis.  He is scheduled for laparoscopic cholecystectomy 03/28/2023 but developed worsening right upper quadrant pain with vomiting and was directed to the emergency department.  Liver function tests are normal.  Ultrasound of the right upper quadrant at this time also appears normal.  He previously had a stone in the neck of his gallbladder.  CT scan of the abdomen pelvis was also done on which the gallbladder looks normal.  He does have a right middle lobe pneumonia.  I was asked to see him for surgical management.  He does complain of some ongoing right upper quadrant pain and he also has been having a cough.  He has vomited at home over the past several days.  He stopped his DOAC on Wednesday.  He has been admitted by the hospitalist and did have an episode of chest pain in the emergency department that resolved after nitroglycerin.  Troponins are pending and EKG was done.  Past Medical History:  Diagnosis Date   Atrial fibrillation (HCC)    Atypical chest pain    CAD (coronary artery disease)    CHF (congestive heart failure) (HCC)    Dyslipidemia    Ex-smoker    History of depression    Hyperlipidemia    Insomnia    Ischemic cardiomyopathy    Ejection fraction 40-45%   NSTEMI (non-ST elevated myocardial infarction) (HCC) 08/01/2007   Treated with a bare metal stent to the proximal LAD, pt states MI x3   Suicide attempt (HCC) 01/30/2001    Past Surgical History:  Procedure Laterality Date   ADENOIDECTOMY     CARDIOVERSION N/A 12/06/2020   Procedure: CARDIOVERSION;  Surgeon: Laurey Morale, MD;  Location: [facility] ENDOSCOPY;   Service: Cardiovascular;  Laterality: N/A;   CCM GENERATOR AND A/V LEAD INSERTION N/A 04/17/2022   Procedure: CCM GENERATOR AND A/V LEAD INSERTION;  Surgeon: Lanier Prude, MD;  Location: [facility] INVASIVE CV LAB;  Service: Cardiovascular;  Laterality: N/A;   COLONOSCOPY     CORONARY ARTERY BYPASS GRAFT N/A 07/03/2020   Procedure: CORONARY ARTERY BYPASS GRAFTING (CABG) times four using left internal mammary artery, right arm radial artery and right leg saphenous vein;  Surgeon: Corliss Skains, MD;  Location: [facility] OR;  Service: Open Heart Surgery;  Laterality: N/A;   CORONARY STENT INTERVENTION N/A 12/03/2020   Procedure: CORONARY STENT INTERVENTION;  Surgeon: Swaziland, Peter M, MD;  Location: [facility] INVASIVE CV LAB;  Service: Cardiovascular;  Laterality: N/A;   CORONARY STENT PLACEMENT     Bare metal stent to proximal LAD   ICD IMPLANT N/A 02/10/2021   Procedure: ICD IMPLANT;  Surgeon: Lanier Prude, MD;  Location: [facility] INVASIVE CV LAB;  Service: Cardiovascular;  Laterality: N/A;   LEFT HEART CATH AND CORONARY ANGIOGRAPHY N/A 07/01/2020   Procedure: LEFT HEART CATH AND CORONARY ANGIOGRAPHY;  Surgeon: Runell Gess, MD;  Location: [facility] INVASIVE CV LAB;  Service: Cardiovascular;  Laterality: N/A;   LEFT HEART CATH AND CORS/GRAFTS ANGIOGRAPHY N/A 12/03/2020   Procedure: LEFT HEART CATH AND CORS/GRAFTS ANGIOGRAPHY;  Surgeon: Swaziland, Peter M, MD;  Location: [facility] INVASIVE CV LAB;  Service: Cardiovascular;  Laterality: N/A;  RADIAL ARTERY HARVEST Right 07/03/2020   Procedure: RADIAL ARTERY HARVEST;  Surgeon: Corliss Skains, MD;  Location: [facility] OR;  Service: Open Heart Surgery;  Laterality: Right;   RIGHT/LEFT HEART CATH AND CORONARY/GRAFT ANGIOGRAPHY N/A 02/19/2023   Procedure: RIGHT/LEFT HEART CATH AND CORONARY/GRAFT ANGIOGRAPHY;  Surgeon: Laurey Morale, MD;  Location: [facility] INVASIVE CV LAB;  Service: Cardiovascular;  Laterality: N/A;   TEE WITHOUT CARDIOVERSION N/A 07/03/2020   Procedure:  TRANSESOPHAGEAL ECHOCARDIOGRAM (TEE);  Surgeon: Corliss Skains, MD;  Location: [facility] OR;  Service: Open Heart Surgery;  Laterality: N/A;   TYMPANOSTOMY TUBE PLACEMENT     when I was a kid   UPPER GASTROINTESTINAL ENDOSCOPY      Family History  Problem Relation Age of Onset   Colon cancer Father 84   Colon cancer Paternal Grandfather    Colon cancer Paternal Aunt    Hypertension Neg Hx    Diabetes Neg Hx    Coronary artery disease Neg Hx    Esophageal cancer Neg Hx    Rectal cancer Neg Hx    Stomach cancer Neg Hx     Social History:  reports that he quit smoking about 2 years ago. His smoking use included cigarettes and cigars. He started smoking about 24 years ago. He has a 22 pack-year smoking history. He has never used smokeless tobacco. He reports that he does not currently use drugs after having used the following drugs: Marijuana. He reports that he does not drink alcohol.  Allergies:  Allergies  Allergen Reactions   Codeine Itching    Medications: I have reviewed the patient's current medications.  Results for orders placed or performed during the hospital encounter of 03/18/23 (from the past 48 hours)  Comprehensive metabolic panel     Status: Abnormal   Collection Time: 03/18/23  4:20 PM  Result Value Ref Range   Sodium 137 135 - 145 mmol/L   Potassium 5.1 3.5 - 5.1 mmol/L   Chloride 107 98 - 111 mmol/L   CO2 19 (L) 22 - 32 mmol/L   Glucose, Bld 86 70 - 99 mg/dL    Comment: Glucose reference range applies only to samples taken after fasting for at least 8 hours.   BUN 22 (H) 6 - 20 mg/dL   Creatinine, Ser 1.61 0.61 - 1.24 mg/dL   Calcium 9.5 8.9 - 09.6 mg/dL   Total Protein 6.8 6.5 - 8.1 g/dL   Albumin 3.5 3.5 - 5.0 g/dL   AST 30 15 - 41 U/L   ALT 36 0 - 44 U/L   Alkaline Phosphatase 80 38 - 126 U/L   Total Bilirubin 0.7 0.0 - 1.2 mg/dL   GFR, Estimated >04 >54 mL/min    Comment: (NOTE) Calculated using the CKD-EPI Creatinine Equation (2021)    Anion gap  11 5 - 15    Comment: Performed at [facility], [address], [place], [place] [address]  CBC with Differential     Status: Abnormal   Collection Time: 03/18/23  4:20 PM  Result Value Ref Range   WBC 10.6 (H) 4.0 - 10.5 K/uL   RBC 5.33 4.22 - 5.81 MIL/uL   Hemoglobin 16.1 13.0 - 17.0 g/dL   HCT 91.4 78.2 - 95.6 %   MCV 93.6 80.0 - 100.0 fL   MCH 30.2 26.0 - 34.0 pg   MCHC 32.3 30.0 - 36.0 g/dL   RDW 21.3 08.6 - 57.8 %   Platelets 253 150 - 400 K/uL  nRBC 0.0 0.0 - 0.2 %   Neutrophils Relative % 66 %   Neutro Abs 7.0 1.7 - 7.7 K/uL   Lymphocytes Relative 23 %   Lymphs Abs 2.4 0.7 - 4.0 K/uL   Monocytes Relative 7 %   Monocytes Absolute 0.7 0.1 - 1.0 K/uL   Eosinophils Relative 3 %   Eosinophils Absolute 0.3 0.0 - 0.5 K/uL   Basophils Relative 1 %   Basophils Absolute 0.1 0.0 - 0.1 K/uL   Immature Granulocytes 0 %   Abs Immature Granulocytes 0.03 0.00 - 0.07 K/uL    Comment: Performed at [facility], [address] [place], [place] [address]  Lipase, blood     Status: None   Collection Time: 03/18/23  4:20 PM  Result Value Ref Range   Lipase 27 11 - 51 U/L    Comment: Performed at [facility], [address] [place], [place] [address]  I-Stat CG4 Lactic Acid     Status: None   Collection Time: 03/18/23  4:32 PM  Result Value Ref Range   Lactic Acid, Venous 1.6 0.5 - 1.9 mmol/L  CBC with Differential/Platelet     Status: None   Collection Time: 03/19/23  5:36 AM  Result Value Ref Range   WBC 8.8 4.0 - 10.5 K/uL   RBC 4.87 4.22 - 5.81 MIL/uL   Hemoglobin 14.5 13.0 - 17.0 g/dL   HCT 82.9 56.2 - 13.0 %   MCV 93.8 80.0 - 100.0 fL   MCH 29.8 26.0 - 34.0 pg   MCHC 31.7 30.0 - 36.0 g/dL   RDW 86.5 78.4 - 69.6 %   Platelets 194 150 - 400 K/uL   nRBC 0.0 0.0 - 0.2 %   Neutrophils Relative % 56 %   Neutro Abs 5.0 1.7 - 7.7 K/uL   Lymphocytes Relative 30 %   Lymphs Abs 2.6 0.7 - 4.0 K/uL   Monocytes Relative 9 %   Monocytes Absolute 0.8 0.1 - 1.0  K/uL   Eosinophils Relative 4 %   Eosinophils Absolute 0.4 0.0 - 0.5 K/uL   Basophils Relative 1 %   Basophils Absolute 0.1 0.0 - 0.1 K/uL   Immature Granulocytes 0 %   Abs Immature Granulocytes 0.02 0.00 - 0.07 K/uL    Comment: Performed at [facility], [address], [place], [place] [address]    CT Angio Chest PE W and/or Wo Contrast Result Date: 03/19/2023 CLINICAL DATA:  Shortness of breath, vomiting, right upper quadrant pain. EXAM: CT ANGIOGRAPHY CHEST CT ABDOMEN AND PELVIS WITH CONTRAST TECHNIQUE: Multidetector CT imaging of the chest was performed using the standard protocol during bolus administration of intravenous contrast. Multiplanar CT image reconstructions and MIPs were obtained to evaluate the vascular anatomy. Multidetector CT imaging of the abdomen and pelvis was performed using the standard protocol during bolus administration of intravenous contrast. RADIATION DOSE REDUCTION: This exam was performed according to the departmental dose-optimization program which includes automated exposure control, adjustment of the mA and/or kV according to patient size and/or use of iterative reconstruction technique. CONTRAST:  75mL OMNIPAQUE IOHEXOL 350 MG/ML SOLN COMPARISON:  Right upper quadrant ultrasound dated 03/18/2023. CT abdomen/pelvis dated 02/14/2023. FINDINGS: CTA CHEST FINDINGS Cardiovascular: Satisfactory opacification of the bilateral pulmonary arteries to the segmental level. No evidence of pulmonary embolism. Although not tailored for evaluation of the thoracic aorta, there is no evidence of thoracic aortic aneurysm or dissection. Heart is normal in size.  No pericardial effusion. Mild  three-vessel coronary atherosclerosis. Postsurgical changes related to prior CABG. Mediastinum/Nodes: No suspicious mediastinal lymphadenopathy. Visualized thyroid is unremarkable. Lungs/Pleura: Mild patchy/nodular opacities in the posterior right upper lobe (series 4/image 48), suggesting mild  infection/pneumonia. No suspicious pulmonary nodules. No pleural effusion or pneumothorax. Musculoskeletal: Visualized osseous structures are within normal limits. Median sternotomy. Review of the MIP images confirms the above findings. CT ABDOMEN and PELVIS FINDINGS Hepatobiliary: Liver is within normal limits. Gallbladder is unremarkable. No intrahepatic or extrahepatic ductal dilatation. Pancreas: Within normal limits. Spleen: Within normal limits Adrenals/Urinary Tract: Adrenal glands are within normal limits. Kidneys are within normal limits.  No hydronephrosis. Bladder is within normal limits. Stomach/Bowel: Stomach is notable for a tiny hiatal hernia. No evidence of bowel obstruction. Normal appendix (series 6/image 31). No colonic wall thickening or inflammatory changes. Vascular/Lymphatic: No evidence of abdominal aortic aneurysm. No suspicious abdominopelvic lymphadenopathy. Reproductive: Prostate is unremarkable. Other: No abdominopelvic ascites. Small left and trace right fat containing inguinal hernias. Musculoskeletal: Visualized osseous structures are within normal limits. Review of the MIP images confirms the above findings. IMPRESSION: No evidence of pulmonary embolism. Mild right upper lobe pneumonia. Gallbladder is within normal limits on CT. Electronically Signed   By: Charline Bills M.D.   On: 03/19/2023 02:04   CT ABDOMEN PELVIS W CONTRAST Result Date: 03/19/2023 CLINICAL DATA:  Shortness of breath, vomiting, right upper quadrant pain. EXAM: CT ANGIOGRAPHY CHEST CT ABDOMEN AND PELVIS WITH CONTRAST TECHNIQUE: Multidetector CT imaging of the chest was performed using the standard protocol during bolus administration of intravenous contrast. Multiplanar CT image reconstructions and MIPs were obtained to evaluate the vascular anatomy. Multidetector CT imaging of the abdomen and pelvis was performed using the standard protocol during bolus administration of intravenous contrast. RADIATION DOSE  REDUCTION: This exam was performed according to the departmental dose-optimization program which includes automated exposure control, adjustment of the mA and/or kV according to patient size and/or use of iterative reconstruction technique. CONTRAST:  75mL OMNIPAQUE IOHEXOL 350 MG/ML SOLN COMPARISON:  Right upper quadrant ultrasound dated 03/18/2023. CT abdomen/pelvis dated 02/14/2023. FINDINGS: CTA CHEST FINDINGS Cardiovascular: Satisfactory opacification of the bilateral pulmonary arteries to the segmental level. No evidence of pulmonary embolism. Although not tailored for evaluation of the thoracic aorta, there is no evidence of thoracic aortic aneurysm or dissection. Heart is normal in size.  No pericardial effusion. Mild three-vessel coronary atherosclerosis. Postsurgical changes related to prior CABG. Mediastinum/Nodes: No suspicious mediastinal lymphadenopathy. Visualized thyroid is unremarkable. Lungs/Pleura: Mild patchy/nodular opacities in the posterior right upper lobe (series 4/image 48), suggesting mild infection/pneumonia. No suspicious pulmonary nodules. No pleural effusion or pneumothorax. Musculoskeletal: Visualized osseous structures are within normal limits. Median sternotomy. Review of the MIP images confirms the above findings. CT ABDOMEN and PELVIS FINDINGS Hepatobiliary: Liver is within normal limits. Gallbladder is unremarkable. No intrahepatic or extrahepatic ductal dilatation. Pancreas: Within normal limits. Spleen: Within normal limits Adrenals/Urinary Tract: Adrenal glands are within normal limits. Kidneys are within normal limits.  No hydronephrosis. Bladder is within normal limits. Stomach/Bowel: Stomach is notable for a tiny hiatal hernia. No evidence of bowel obstruction. Normal appendix (series 6/image 31). No colonic wall thickening or inflammatory changes. Vascular/Lymphatic: No evidence of abdominal aortic aneurysm. No suspicious abdominopelvic lymphadenopathy. Reproductive:  Prostate is unremarkable. Other: No abdominopelvic ascites. Small left and trace right fat containing inguinal hernias. Musculoskeletal: Visualized osseous structures are within normal limits. Review of the MIP images confirms the above findings. IMPRESSION: No evidence of pulmonary embolism. Mild right upper lobe pneumonia. Gallbladder is within  normal limits on CT. Electronically Signed   By: Charline Bills M.D.   On: 03/19/2023 02:04   US ABDOMEN LIMITED RUQ (LIVER/GB) Result Date: 03/18/2023 CLINICAL DATA:  Right upper quadrant abdominal pain. EXAM: ULTRASOUND ABDOMEN LIMITED RIGHT UPPER QUADRANT COMPARISON:  Ultrasound dated 02/18/2023. FINDINGS: Gallbladder: No gallstones or wall thickening visualized. No sonographic Murphy sign noted by sonographer. Common bile duct: Diameter: 5 mm Liver: The liver is unremarkable. Portal vein is patent on color Doppler imaging with normal direction of blood flow towards the liver. Other: None. IMPRESSION: Unremarkable right upper quadrant ultrasound. Electronically Signed   By: Elgie Collard M.D.   On: 03/18/2023 17:35    Review of Systems  Constitutional:  Positive for appetite change.  HENT: Negative.    Eyes: Negative.   Respiratory:  Positive for cough.   Cardiovascular:  Positive for chest pain.  Gastrointestinal:  Positive for abdominal pain, nausea and vomiting.  Endocrine: Negative.   Genitourinary: Negative.   Musculoskeletal: Negative.   Skin: Negative.   Allergic/Immunologic: Negative.   Neurological: Negative.   Hematological: Negative.   Psychiatric/Behavioral: Negative.     Blood pressure 116/79, pulse 74, temperature 97.7 F (36.5 C), temperature source Oral, resp. rate 18, SpO2 98%. Physical Exam HENT:     Head: Normocephalic.  Cardiovascular:     Rate and Rhythm: Normal rate. Rhythm irregular.  Pulmonary:     Effort: Pulmonary effort is normal.     Breath sounds: No wheezing or rhonchi.  Abdominal:     General: Abdomen  is flat.     Palpations: Abdomen is soft.     Tenderness: There is abdominal tenderness in the right upper quadrant. There is no guarding or rebound.  Skin:    General: Skin is warm.  Neurological:     Mental Status: He is alert and oriented to person, place, and time.  Psychiatric:        Mood and Affect: Mood normal.     Assessment/Plan: Right upper quadrant abdominal pain -recent imaging had shown a gallstone and his clinical picture was consistent with chronic cholecystitis.  Current imaging including ultrasound and CAT scan shows no gallstones at this time and no gallbladder abnormalities.  Liver function tests are normal.  I recommend HIDA scan. Right middle lobe pneumonia - ABX per TRH CAD, heart failure -recommend cardiology consultation for management and perioperative evaluation for possible surgery.  Please continue to hold apixaban.  Liz Malady 03/19/2023, 6:27 AM

## 2023-03-19 NOTE — ED Notes (Signed)
Chest pain resided after one admin of nitro.

## 2023-03-19 NOTE — Addendum Note (Signed)
Addended by: Elease Etienne A on: 03/19/2023 02:45 PM   Modules accepted: Orders

## 2023-03-19 NOTE — Hospital Course (Signed)
Prior to 3 dasy ago pain in RUQ would be one day and better the next. No movement or food would provoke pain. Not relieved with bowel movement.   Pain been going since day after thalksfiving.   Constipation at baseline. Has hard ball stools. Drop of blood in stool yesterday.  Stool every couple of days.  N/V.

## 2023-03-19 NOTE — Progress Notes (Addendum)
Progress Note   Patient: Wyatt Donovan ZOX:[id number] DOB: January 25, 1986 DOA: 03/18/2023     0 DOS: the patient was seen and examined on 03/19/2023   Brief hospital course: 38 y.o. male with history of early onset CAD status post CABG, ischemic cardiomyopathy, LV thrombus, atrial fibrillation presents to the ER with complaints of worsening right upper quadrant pain with nausea vomiting.    Labs were notable for normal LFTs and mildly elevated white blood cell count.  CT angiogram of the chest with no PE. CT of abdomen pelvis with contrast with no gallbladder pathology or liver pathology. Underwent  HIDA scan which no evidence of cholecystitis.  Patient did note some substernal chest pressure in the ED.  EKG with some T wave changes.  He received one-time dose of sublingual nitroglycerin which helped alleviate his pain.  Assessment and Plan:  Right upper quadrant pain of unclear etiology Unclear exact etiology of patient's pain.  Patient presented with normal LFTs.  CT abdomen and pelvis without any gallbladder or liver pathology.  Right upper quadrant ultrasound without evidence of gallbladder stones.  Patient underwent HIDA scan as well which was negative for common or cystic duct obstruction. He underwent upper endoscopy 07/30/2021 which showed localized inflammation in gastric antrum. Esophagus and duodenum were normal.  Colonoscopy notable for nl terminal ileum. Two sesile polyps in transverse colon and cecum.  Localized inflammation in sigmoid colon. Four sessile polyps in sigmoid colon. Non bleeding internal hemorrhoids were noted.  Polyps were inflammatory and hyperplastic and negative for dysplasia. Patient denies any precipitating factors. Does note some significant reflux symptoms and constipation.  -Start IV Protonix -Trial Carafate -Suppository   Possible pneumonia  Patient with no cough or dyspnea. Noted to have mild patchy/nodular opacities in the posterior RUL. Likely due to  aspiration. He had a mild leukoctyosis on admission that has since resolved and has been afebrile.  -Will continue antibiotics for now with ceftriaxone and doxycycline   History of CAD s/p CABG 4v (2022) s/p PCI w/ stent to LAD in 2012, s/p PCI DES to Lcx 2022 LV thrombus Recent cardiac cath 02/17/23. Follows with Dr. Shirlee Latch.  After cath was started on digoxin.  Did have substernal chest pressure that resolved with nitroglycerin. Troponin 27 on admission (appears to be chronically elevated). EKG with some t wave changes in leads III and Avf, new compared to 01/2023 EKG -Will repeat troponin  -Continue atorvastatin, on eliquis so no ASA, plavix stopped 09/2021 due to GI bleed  Chronic systolic CHF, Ischemic cardiomyopathy  TTE 01/2023 with EF 30-35%, global hypokinesis, mild RV dysfunction. Underwent [facility] 01/2023 noted to have occluded grafts except LIMA-LAD. Occluded native RCA. Patent m Lcx stent. Started on digoxin.  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  He developed worsening symptoms.  He had DCCV in 10/22.  Previously on amiodarone. He is currently in NSR.  - Continue Eliquis.       Subjective: Patient states that he has had intermittent RUQ abdominal pain since about a month and a half ago. He Does not recall any precipitating factors.  More specifically, he denies any association with movement or with ingestion of food.  He also states that the pain was not relieved with a bowel movement.  Over the past 3 days, the pain has been persistent  and he has had some associated nausea and vomiting.  He notes at baseline he has some constipation.  His last bowel movement was the day prior to admission with some hard stools.   Patient also states that over the past 3 weeks  he has had some difficulty with ingesting fluids. He sometimes has to swallow more frequently to get these down. During this time he has had no issue with solids getting stuck.   At bedside, patient has some abdominal but notes no radiation. He did have some chest pain earlier in the day but states that this has resolved with nitroglycerin x1. He denies any shortness of breath.    Physical Exam: Vitals:   03/19/23 1330 03/19/23 1430 03/19/23 1445 03/19/23 1515  BP:  120/74 127/78 126/75  Pulse:  84 81 87  Resp:  19 12 12   Temp: 97.7 F (36.5 C)     TempSrc: Oral     SpO2:  98% 93% 99%   Physical Exam  Constitutional: In no distress.  Cardiovascular: Normal rate, regular rhythm. No lower extremity edema  Pulmonary: Non labored breathing on room air, no wheezing or rales.   Abdominal: Soft. Mild TTP in RUQ, no rebound tenderness or guarding. Non distended  Musculoskeletal: Normal range of motion.     Neurological: Alert and oriented to person, place, and time. Non focal  Skin: Skin is warm and dry.   Data Reviewed:  I have reviewed all labs and images.  EKG this a.m. with T wave changes in leads III and aVF, appears new from ECG 01/2023    Latest Ref Rng & Units 03/19/2023    5:36 AM 03/18/2023    4:20 PM 02/19/2023    8:11 AM  CBC  WBC 4.0 - 10.5 K/uL 8.8  10.6    Hemoglobin 13.0 - 17.0 g/dL 13.0  86.5  78.4    69.6   Hematocrit 39.0 - 52.0 % 45.7  49.9  43.0    44.0   Platelets 150 - 400 K/uL 194  253        Latest Ref Rng & Units 03/19/2023    5:36 AM 03/18/2023    4:20 PM 02/19/2023    8:11 AM  BMP  Glucose 70 - 99 mg/dL 89  86    BUN 6 - 20 mg/dL 17  22    Creatinine 2.95 - 1.24 mg/dL 2.84  1.32    Sodium 440 - 145 mmol/L 138  137  140    140   Potassium 3.5 - 5.1 mmol/L 4.2  5.1  4.7    4.6   Chloride 98 - 111 mmol/L 103  107    CO2 22 - 32 mmol/L 24  19    Calcium 8.9 - 10.3 mg/dL 9.0  9.5       Family Communication: Patient able to communicate with  family   Disposition: Status is: Inpt because of intractible abdominal pain requiring IV opioids.   Planned Discharge Destination: Home    Time spent: 35 minutes  Author: Marolyn Haller, MD 03/19/2023 3:35 PM  For on call review [email or web address].

## 2023-03-19 NOTE — ED Notes (Signed)
Patient remains in xray 

## 2023-03-19 NOTE — Progress Notes (Signed)
Remote ICD transmission.   

## 2023-03-19 NOTE — Plan of Care (Signed)
Will continue to monitor patient.

## 2023-03-19 NOTE — H&P (Signed)
History and Physical    BOHAN BOTHUN WUJ:[id number] DOB: 1985/04/24 DOA: 03/18/2023  Patient coming from: Home.  Chief Complaint: Right upper quadrant pain nausea vomiting.  HPI: Wyatt Donovan is a 38 y.o. male with history of early onset CAD status post CABG, ischemic cardiomyopathy, LV thrombus, atrial fibrillation presents to the ER with complaints of worsening right upper quadrant pain with nausea vomiting.  Patient last month had followed up at the ER in [facility] when patient was found to have gallstone within the cystic duct and was referred to general surgery.  Subsequent to that patient also had a cardiac cath done by cardiology.  Plan was to follow-up with general surgery but since patient symptoms have been getting worse last 2 to 3 days patient presents to the ER.  ED Course: In the ER patient had CT angiogram of the chest and also abdomen pelvis with contrast which does not show any gallbladder pathology but does show concerning features for pneumonia.  ER physician discussed with on-call general surgeon who advised  getting HIDA scan.  Patient was started on empiric antibiotics for pneumonia.  Labs show normal LFTs WBC count 10.6.  While I was examining the patient patient was complaining of some substernal chest pressure I ordered EKG which did not show any acute changes and troponins are pending.  After sublingual nitroglycerin was given chest pain subsided.  Review of Systems: As per HPI, rest all negative.   Past Medical History:  Diagnosis Date   Atrial fibrillation (HCC)    Atypical chest pain    CAD (coronary artery disease)    CHF (congestive heart failure) (HCC)    Dyslipidemia    Ex-smoker    History of depression    Hyperlipidemia    Insomnia    Ischemic cardiomyopathy    Ejection fraction 40-45%   NSTEMI (non-ST elevated myocardial infarction) (HCC) 08/01/2007   Treated with a bare metal stent to the proximal LAD, pt states MI x3   Suicide attempt (HCC)  01/30/2001    Past Surgical History:  Procedure Laterality Date   ADENOIDECTOMY     CARDIOVERSION N/A 12/06/2020   Procedure: CARDIOVERSION;  Surgeon: Laurey Morale, MD;  Location: [facility] ENDOSCOPY;  Service: Cardiovascular;  Laterality: N/A;   CCM GENERATOR AND A/V LEAD INSERTION N/A 04/17/2022   Procedure: CCM GENERATOR AND A/V LEAD INSERTION;  Surgeon: Lanier Prude, MD;  Location: [facility] INVASIVE CV LAB;  Service: Cardiovascular;  Laterality: N/A;   COLONOSCOPY     CORONARY ARTERY BYPASS GRAFT N/A 07/03/2020   Procedure: CORONARY ARTERY BYPASS GRAFTING (CABG) times four using left internal mammary artery, right arm radial artery and right leg saphenous vein;  Surgeon: Corliss Skains, MD;  Location: [facility] OR;  Service: Open Heart Surgery;  Laterality: N/A;   CORONARY STENT INTERVENTION N/A 12/03/2020   Procedure: CORONARY STENT INTERVENTION;  Surgeon: Swaziland, Peter M, MD;  Location: [facility] INVASIVE CV LAB;  Service: Cardiovascular;  Laterality: N/A;   CORONARY STENT PLACEMENT     Bare metal stent to proximal LAD   ICD IMPLANT N/A 02/10/2021   Procedure: ICD IMPLANT;  Surgeon: Lanier Prude, MD;  Location: [facility] INVASIVE CV LAB;  Service: Cardiovascular;  Laterality: N/A;   LEFT HEART CATH AND CORONARY ANGIOGRAPHY N/A 07/01/2020   Procedure: LEFT HEART CATH AND CORONARY ANGIOGRAPHY;  Surgeon: Runell Gess, MD;  Location: [facility] INVASIVE CV LAB;  Service: Cardiovascular;  Laterality: N/A;   LEFT HEART CATH AND CORS/GRAFTS ANGIOGRAPHY  N/A 12/03/2020   Procedure: LEFT HEART CATH AND CORS/GRAFTS ANGIOGRAPHY;  Surgeon: Swaziland, Peter M, MD;  Location: [facility] INVASIVE CV LAB;  Service: Cardiovascular;  Laterality: N/A;   RADIAL ARTERY HARVEST Right 07/03/2020   Procedure: RADIAL ARTERY HARVEST;  Surgeon: Corliss Skains, MD;  Location: [facility] OR;  Service: Open Heart Surgery;  Laterality: Right;   RIGHT/LEFT HEART CATH AND CORONARY/GRAFT ANGIOGRAPHY N/A 02/19/2023   Procedure: RIGHT/LEFT HEART  CATH AND CORONARY/GRAFT ANGIOGRAPHY;  Surgeon: Laurey Morale, MD;  Location: [facility] INVASIVE CV LAB;  Service: Cardiovascular;  Laterality: N/A;   TEE WITHOUT CARDIOVERSION N/A 07/03/2020   Procedure: TRANSESOPHAGEAL ECHOCARDIOGRAM (TEE);  Surgeon: Corliss Skains, MD;  Location: [facility] OR;  Service: Open Heart Surgery;  Laterality: N/A;   TYMPANOSTOMY TUBE PLACEMENT     when I was a kid   UPPER GASTROINTESTINAL ENDOSCOPY       reports that he quit smoking about 2 years ago. His smoking use included cigarettes and cigars. He started smoking about 24 years ago. He has a 22 pack-year smoking history. He has never used smokeless tobacco. He reports that he does not currently use drugs after having used the following drugs: Marijuana. He reports that he does not drink alcohol.  Allergies  Allergen Reactions   Codeine Itching    Family History  Problem Relation Age of Onset   Colon cancer Father 39   Colon cancer Paternal Grandfather    Colon cancer Paternal Aunt    Hypertension Neg Hx    Diabetes Neg Hx    Coronary artery disease Neg Hx    Esophageal cancer Neg Hx    Rectal cancer Neg Hx    Stomach cancer Neg Hx     Prior to Admission medications   Medication Sig Start Date End Date Taking? Authorizing Provider  apixaban (ELIQUIS) 5 MG TABS tablet Take 1 tablet (5 mg total) by mouth 2 (two) times daily. 10/24/22  Yes Modena Slater, DO  atorvastatin (LIPITOR) 80 MG tablet Take 1 tablet (80 mg total) by mouth daily. 03/12/23  Yes Laurey Morale, MD  carvedilol (COREG) 3.125 MG tablet TAKE ONE TABLET BY MOUTH TWICE DAILY WITH MEALS 05/04/22  Yes Graciella Freer, PA-C  dapagliflozin propanediol (FARXIGA) 10 MG TABS tablet Take 1 tablet (10 mg total) by mouth daily before breakfast. 11/30/22  Yes Laurey Morale, MD  digoxin (LANOXIN) 0.125 MG tablet Take 1 tablet (125 mcg total) by mouth daily. 02/22/23 02/22/24 Yes Laurey Morale, MD  furosemide (LASIX) 20 MG tablet Take 20 mg by  mouth daily.   Yes [provider]  linaclotide (LINZESS) 290 MCG CAPS capsule Take 1 capsule (290 mcg total) by mouth daily before breakfast. Patient taking differently: Take 290 mcg by mouth daily as needed (for constipation). 08/04/22  Yes Imogene Burn, MD  nitroGLYCERIN (NITROSTAT) 0.4 MG SL tablet Place 1 tablet (0.4 mg total) under the tongue every 5 (five) minutes x 3 doses as needed for chest pain. 01/27/22  Yes Laurey Morale, MD  Omega-3 Fatty Acids (FISH OIL) 500 MG CAPS Take 2,000 mg by mouth daily.   Yes [provider]  ondansetron (ZOFRAN-ODT) 4 MG disintegrating tablet Take 4 mg by mouth every 8 (eight) hours as needed for nausea or vomiting.   Yes [provider]  oxyCODONE-acetaminophen (PERCOCET/ROXICET) 5-325 MG tablet Take 1 tablet by mouth every 4 (four) hours as needed for severe pain (pain score 7-10). 02/18/23  Yes Al Decant,  PA-C  pantoprazole (PROTONIX) 40 MG tablet Take 1 tablet (40 mg total) by mouth daily. 12/02/22 02/17/24 Yes Laurey Morale, MD  sacubitril-valsartan (ENTRESTO) 24-26 MG Take 1 tablet by mouth 2 (two) times daily. 11/23/22  Yes Laurey Morale, MD  sodium zirconium cyclosilicate (LOKELMA) 5 g packet Take 5 g by mouth daily. 02/17/23  Yes Laurey Morale, MD  spironolactone (ALDACTONE) 25 MG tablet Take 1 tablet (25 mg total) by mouth daily. 02/19/23  Yes Jonita Albee, PA-C    Physical Exam: Constitutional: Moderately built and nourished. Vitals:   03/18/23 2235 03/18/23 2239 03/18/23 2240 03/19/23 0145  BP: 120/87  122/88 113/89  Pulse: 63 64 66 67  Resp: 19 14 15 10   Temp:   97.9 F (36.6 C) 97.8 F (36.6 C)  TempSrc:   Oral Oral  SpO2: 100% 98% 100% 92%   Eyes: Anicteric no pallor. ENMT: No discharge from the ears eyes nose or mouth. Neck: No mass felt.  No neck rigidity. Respiratory: No rhonchi or crepitations. Cardiovascular: S1-S2 heard. Abdomen: Epigastric and right upper quadrant  tenderness. Musculoskeletal: No edema. Skin: No rash. Neurologic: Alert awake oriented time place and person.  Moves all extremities. Psychiatric: Appears normal.  Normal affect.   Labs on Admission: I have personally reviewed following labs and imaging studies  CBC: Recent Labs  Lab 03/18/23 1620  WBC 10.6*  NEUTROABS 7.0  HGB 16.1  HCT 49.9  MCV 93.6  PLT 253   Basic Metabolic Panel: Recent Labs  Lab 03/18/23 1620  NA 137  K 5.1  CL 107  CO2 19*  GLUCOSE 86  BUN 22*  CREATININE 1.16  CALCIUM 9.5   GFR: CrCl cannot be calculated (Unknown ideal weight.). Liver Function Tests: Recent Labs  Lab 03/18/23 1620  AST 30  ALT 36  ALKPHOS 80  BILITOT 0.7  PROT 6.8  ALBUMIN 3.5   Recent Labs  Lab 03/18/23 1620  LIPASE 27   No results for input(s): "AMMONIA" in the last 168 hours. Coagulation Profile: No results for input(s): "INR", "PROTIME" in the last 168 hours. Cardiac Enzymes: No results for input(s): "CKTOTAL", "CKMB", "CKMBINDEX", "TROPONINI" in the last 168 hours. BNP (last 3 results) Recent Labs    10/20/22 0838  PROBNP 441*   HbA1C: No results for input(s): "HGBA1C" in the last 72 hours. CBG: No results for input(s): "GLUCAP" in the last 168 hours. Lipid Profile: No results for input(s): "CHOL", "HDL", "LDLCALC", "TRIG", "CHOLHDL", "LDLDIRECT" in the last 72 hours. Thyroid Function Tests: No results for input(s): "TSH", "T4TOTAL", "FREET4", "T3FREE", "THYROIDAB" in the last 72 hours. Anemia Panel: No results for input(s): "VITAMINB12", "FOLATE", "FERRITIN", "TIBC", "IRON", "RETICCTPCT" in the last 72 hours. Urine analysis:    Component Value Date/Time   COLORURINE YELLOW 02/18/2023 0105   APPEARANCEUR CLEAR 02/18/2023 0105   LABSPEC 1.024 02/18/2023 0105   PHURINE 5.0 02/18/2023 0105   GLUCOSEU >=500 (A) 02/18/2023 0105   HGBUR NEGATIVE 02/18/2023 0105   BILIRUBINUR NEGATIVE 02/18/2023 0105   KETONESUR NEGATIVE 02/18/2023 0105    PROTEINUR NEGATIVE 02/18/2023 0105   NITRITE NEGATIVE 02/18/2023 0105   LEUKOCYTESUR NEGATIVE 02/18/2023 0105   Sepsis Labs: @LABRCNTIP (procalcitonin:4,lacticidven:4) )No results found for this or any previous visit (from the past 240 hours).   Radiological Exams on Admission: CT Angio Chest PE W and/or Wo Contrast Result Date: 03/19/2023 CLINICAL DATA:  Shortness of breath, vomiting, right upper quadrant pain. EXAM: CT ANGIOGRAPHY CHEST CT ABDOMEN AND PELVIS WITH CONTRAST TECHNIQUE:  Multidetector CT imaging of the chest was performed using the standard protocol during bolus administration of intravenous contrast. Multiplanar CT image reconstructions and MIPs were obtained to evaluate the vascular anatomy. Multidetector CT imaging of the abdomen and pelvis was performed using the standard protocol during bolus administration of intravenous contrast. RADIATION DOSE REDUCTION: This exam was performed according to the departmental dose-optimization program which includes automated exposure control, adjustment of the mA and/or kV according to patient size and/or use of iterative reconstruction technique. CONTRAST:  75mL OMNIPAQUE IOHEXOL 350 MG/ML SOLN COMPARISON:  Right upper quadrant ultrasound dated 03/18/2023. CT abdomen/pelvis dated 02/14/2023. FINDINGS: CTA CHEST FINDINGS Cardiovascular: Satisfactory opacification of the bilateral pulmonary arteries to the segmental level. No evidence of pulmonary embolism. Although not tailored for evaluation of the thoracic aorta, there is no evidence of thoracic aortic aneurysm or dissection. Heart is normal in size.  No pericardial effusion. Mild three-vessel coronary atherosclerosis. Postsurgical changes related to prior CABG. Mediastinum/Nodes: No suspicious mediastinal lymphadenopathy. Visualized thyroid is unremarkable. Lungs/Pleura: Mild patchy/nodular opacities in the posterior right upper lobe (series 4/image 48), suggesting mild infection/pneumonia. No  suspicious pulmonary nodules. No pleural effusion or pneumothorax. Musculoskeletal: Visualized osseous structures are within normal limits. Median sternotomy. Review of the MIP images confirms the above findings. CT ABDOMEN and PELVIS FINDINGS Hepatobiliary: Liver is within normal limits. Gallbladder is unremarkable. No intrahepatic or extrahepatic ductal dilatation. Pancreas: Within normal limits. Spleen: Within normal limits Adrenals/Urinary Tract: Adrenal glands are within normal limits. Kidneys are within normal limits.  No hydronephrosis. Bladder is within normal limits. Stomach/Bowel: Stomach is notable for a tiny hiatal hernia. No evidence of bowel obstruction. Normal appendix (series 6/image 31). No colonic wall thickening or inflammatory changes. Vascular/Lymphatic: No evidence of abdominal aortic aneurysm. No suspicious abdominopelvic lymphadenopathy. Reproductive: Prostate is unremarkable. Other: No abdominopelvic ascites. Small left and trace right fat containing inguinal hernias. Musculoskeletal: Visualized osseous structures are within normal limits. Review of the MIP images confirms the above findings. IMPRESSION: No evidence of pulmonary embolism. Mild right upper lobe pneumonia. Gallbladder is within normal limits on CT. Electronically Signed   By: Charline Bills M.D.   On: 03/19/2023 02:04   CT ABDOMEN PELVIS W CONTRAST Result Date: 03/19/2023 CLINICAL DATA:  Shortness of breath, vomiting, right upper quadrant pain. EXAM: CT ANGIOGRAPHY CHEST CT ABDOMEN AND PELVIS WITH CONTRAST TECHNIQUE: Multidetector CT imaging of the chest was performed using the standard protocol during bolus administration of intravenous contrast. Multiplanar CT image reconstructions and MIPs were obtained to evaluate the vascular anatomy. Multidetector CT imaging of the abdomen and pelvis was performed using the standard protocol during bolus administration of intravenous contrast. RADIATION DOSE REDUCTION: This exam  was performed according to the departmental dose-optimization program which includes automated exposure control, adjustment of the mA and/or kV according to patient size and/or use of iterative reconstruction technique. CONTRAST:  75mL OMNIPAQUE IOHEXOL 350 MG/ML SOLN COMPARISON:  Right upper quadrant ultrasound dated 03/18/2023. CT abdomen/pelvis dated 02/14/2023. FINDINGS: CTA CHEST FINDINGS Cardiovascular: Satisfactory opacification of the bilateral pulmonary arteries to the segmental level. No evidence of pulmonary embolism. Although not tailored for evaluation of the thoracic aorta, there is no evidence of thoracic aortic aneurysm or dissection. Heart is normal in size.  No pericardial effusion. Mild three-vessel coronary atherosclerosis. Postsurgical changes related to prior CABG. Mediastinum/Nodes: No suspicious mediastinal lymphadenopathy. Visualized thyroid is unremarkable. Lungs/Pleura: Mild patchy/nodular opacities in the posterior right upper lobe (series 4/image 48), suggesting mild infection/pneumonia. No suspicious pulmonary nodules. No pleural effusion or  pneumothorax. Musculoskeletal: Visualized osseous structures are within normal limits. Median sternotomy. Review of the MIP images confirms the above findings. CT ABDOMEN and PELVIS FINDINGS Hepatobiliary: Liver is within normal limits. Gallbladder is unremarkable. No intrahepatic or extrahepatic ductal dilatation. Pancreas: Within normal limits. Spleen: Within normal limits Adrenals/Urinary Tract: Adrenal glands are within normal limits. Kidneys are within normal limits.  No hydronephrosis. Bladder is within normal limits. Stomach/Bowel: Stomach is notable for a tiny hiatal hernia. No evidence of bowel obstruction. Normal appendix (series 6/image 31). No colonic wall thickening or inflammatory changes. Vascular/Lymphatic: No evidence of abdominal aortic aneurysm. No suspicious abdominopelvic lymphadenopathy. Reproductive: Prostate is unremarkable.  Other: No abdominopelvic ascites. Small left and trace right fat containing inguinal hernias. Musculoskeletal: Visualized osseous structures are within normal limits. Review of the MIP images confirms the above findings. IMPRESSION: No evidence of pulmonary embolism. Mild right upper lobe pneumonia. Gallbladder is within normal limits on CT. Electronically Signed   By: Charline Bills M.D.   On: 03/19/2023 02:04   US ABDOMEN LIMITED RUQ (LIVER/GB) Result Date: 03/18/2023 CLINICAL DATA:  Right upper quadrant abdominal pain. EXAM: ULTRASOUND ABDOMEN LIMITED RIGHT UPPER QUADRANT COMPARISON:  Ultrasound dated 02/18/2023. FINDINGS: Gallbladder: No gallstones or wall thickening visualized. No sonographic Murphy sign noted by sonographer. Common bile duct: Diameter: 5 mm Liver: The liver is unremarkable. Portal vein is patent on color Doppler imaging with normal direction of blood flow towards the liver. Other: None. IMPRESSION: Unremarkable right upper quadrant ultrasound. Electronically Signed   By: Elgie Collard M.D.   On: 03/18/2023 17:35    EKG: Independently reviewed.  Normal sinus rhythm PVCs.  Assessment/Plan Principal Problem:   RUQ pain Active Problems:   Chest pain   S/P CABG x 4   Coronary artery disease   Hypercholesterolemia   Chronic systolic CHF (congestive heart failure) (HCC)   Ischemic cardiomyopathy   CAP (community acquired pneumonia)    Right upper quadrant pain -    CT abdomen and ultrasound abdomen does not show anything acute.  Patient had concerning features for possible cystic duct stone last month on 02/14/2023 CT scan results available in Care Everywhere..  ER physician discussed with general surgeon who advised to get HIDA scan.  LFTs are normal.  Will follow HIDA scan results.  Check UDS. Possible pneumonia on empiric antibiotics.  Suspect aspiration from nausea vomiting. History of CAD status post CABG and stenting has had recent cardiac cath on 02/17/2023 and  follows up with Dr. Shirlee Latch cardiologist.  After the cardiac cath patient had digoxin added.  During my exam patient did have some substernal chest pressure which resolved after taking sublingual nitroglycerin.  Troponins are pending.  Continue statins, carvedilol.  Presently on heparin infusion. History of ischemic cardiomyopathy last EF measured was 30 to 35% on December 2024.  Appears compensated.  Takes Lasix 20 mg p.o. daily along with Entresto, spironolactone, Lokelma, carvedilol, Jardiance. Atrial fibrillation and history of LV thrombus takes Eliquis which patient has not taken for last 2 days due to the abdominal pain and concerned that patient may need surgery.  Presently on heparin infusion.  And for rate control on carvedilol and digoxin.  Digoxin levels are pending.  Given the right upper quadrant pain and possible pneumonia patient will need more than 2 midnight stay.   DVT prophylaxis: Heparin infusion. Code Status: Full code. Family Communication: Patient's father. Disposition Plan: Cardiac telemetry. Consults called: General surgery. Admission status: Observation.

## 2023-03-19 NOTE — ED Notes (Signed)
Lunch tray ordered 

## 2023-03-20 DIAGNOSIS — K5900 Constipation, unspecified: Secondary | ICD-10-CM

## 2023-03-20 DIAGNOSIS — Z7901 Long term (current) use of anticoagulants: Secondary | ICD-10-CM | POA: Diagnosis not present

## 2023-03-20 DIAGNOSIS — R112 Nausea with vomiting, unspecified: Secondary | ICD-10-CM

## 2023-03-20 DIAGNOSIS — R1011 Right upper quadrant pain: Secondary | ICD-10-CM | POA: Diagnosis not present

## 2023-03-20 DIAGNOSIS — J189 Pneumonia, unspecified organism: Secondary | ICD-10-CM | POA: Diagnosis not present

## 2023-03-20 LAB — BASIC METABOLIC PANEL
Anion gap: 7 (ref 5–15)
BUN: 14 mg/dL (ref 6–20)
CO2: 23 mmol/L (ref 22–32)
Calcium: 8.8 mg/dL — ABNORMAL LOW (ref 8.9–10.3)
Chloride: 106 mmol/L (ref 98–111)
Creatinine, Ser: 0.88 mg/dL (ref 0.61–1.24)
GFR, Estimated: >60 mL/min (ref >60)
Glucose, Bld: 83 mg/dL (ref 70–99)
Potassium: 5.1 mmol/L (ref 3.5–5.1)
Sodium: 136 mmol/L (ref 135–145)

## 2023-03-20 LAB — CBC WITH DIFFERENTIAL/PLATELET
Abs Immature Granulocytes: 0.03 10*3/uL (ref 0.00–0.07)
Basophils Absolute: 0.1 10*3/uL (ref 0.0–0.1)
Basophils Relative: 1 %
Eosinophils Absolute: 0.3 10*3/uL (ref 0.0–0.5)
Eosinophils Relative: 5 %
HCT: 44.3 % (ref 39.0–52.0)
Hemoglobin: 14.5 g/dL (ref 13.0–17.0)
Immature Granulocytes: 1 %
Lymphocytes Relative: 37 %
Lymphs Abs: 2.1 10*3/uL (ref 0.7–4.0)
MCH: 30 pg (ref 26.0–34.0)
MCHC: 32.7 g/dL (ref 30.0–36.0)
MCV: 91.5 fL (ref 80.0–100.0)
Monocytes Absolute: 0.5 10*3/uL (ref 0.1–1.0)
Monocytes Relative: 9 %
Neutro Abs: 2.8 10*3/uL (ref 1.7–7.7)
Neutrophils Relative %: 47 %
Platelets: 199 10*3/uL (ref 150–400)
RBC: 4.84 MIL/uL (ref 4.22–5.81)
RDW: 13.7 % (ref 11.5–15.5)
WBC: 5.7 10*3/uL (ref 4.0–10.5)
nRBC: 0 % (ref 0.0–0.2)

## 2023-03-20 LAB — MAGNESIUM: Magnesium: 2.3 mg/dL (ref 1.7–2.4)

## 2023-03-20 MED ORDER — POLYETHYLENE GLYCOL 3350 17 G PO PACK
17.0000 g | PACK | Freq: Every day | ORAL | Status: DC
  Administered 2023-03-20: 17 g via ORAL
  Filled 2023-03-20 (×2): qty 1

## 2023-03-20 MED ORDER — SODIUM CHLORIDE 0.9 % IV SOLN: INTRAVENOUS | Status: AC

## 2023-03-20 MED ORDER — HYDROCORTISONE ACETATE 25 MG RE SUPP
25.0000 mg | Freq: Two times a day (BID) | RECTAL | Status: DC
  Filled 2023-03-20 (×5): qty 1

## 2023-03-20 MED ORDER — DOCUSATE SODIUM 100 MG PO CAPS
100.0000 mg | ORAL_CAPSULE | Freq: Two times a day (BID) | ORAL | Status: DC
  Administered 2023-03-20 – 2023-03-22 (×4): 100 mg via ORAL
  Filled 2023-03-20 (×5): qty 1

## 2023-03-20 NOTE — Progress Notes (Addendum)
[facility]                                                                              Hyrum Canter, is a 38 y.o. male, DOB - 07/04/1985, ZOX:[id number] Admit date - 03/18/2023    Outpatient Primary MD for the patient is Toma Deiters, MD  LOS - 1  days  Chief Complaint  Patient presents with   Abdominal Pain       Brief summary   Patient is a 38 year old male with early onset CAD status post CABG, had initial MI in 2012 at the age of 45 with PCI to LAD, inferior lateral MI in 05/2020, LHC showed three-vessel disease, CABG x 4, postop GI bleeding after CABG, prior history of LV thrombus, chronic systolic CHF, prior smoker, paroxysmal atrial fibrillation presented with worsening right upper quadrant and epigastric abdominal pain with nausea and vomiting.  CT angiogram of the chest showed no PE but showed mild right upper lobe pneumonia.  CT abdomen pelvis with no gallbladder pathology or liver pathology.  HIDA scan showed normal EF with no evidence of cholecystitis.  Of note, patient had presented to ED in 01/2023 with RUQ abdominal pain, CT abdomen had shown stone in the cystic duct with mildly distended gallbladder with no evidence of cholecystitis)) Also reported some substernal chest pressure in ED, received sublingual nitro x 1 which alleviated his pain.   Assessment & Plan    Principal Problem:   RUQ, epigastric abdominal pain ?melena  -Unclear etiology, has undergone RUQ [place], CT abdomen pelvis with no gallbladder pathology or liver pathology.  HIDA scan showed normal EF with no evidence of cholecystitis.   -General Surgery was consulted, seen by Dr. Violeta Gelinas, had recommended HIDA scan. -HIDA scan showed normal EF with no evidence of cholecystitis or biliary dyskinesia.  Per surgery, no need for lap [place]y based on findings. -Prior EGD in 06/2021: localized inflammation in gastric antrum, normal esophagus, duodenum, colonoscopy normal, 2 polyps in  transverse colon and cecum, localized inflammation in sigmoid colon.  Internal hemorrhoids. -Patient continues to complain of abdominal pain, states had taken Tylenol and NSAIDs at home for the pain, had melanotic stool 3 days PTA and 1 last night -Continue IV PPI, GI consulted  Active Problems: Right upper lobe pneumonia/community-acquired pneumonia -Mild leukocytosis on admission 10.6, procalcitonin less than 0.1 -Continue ceftriaxone, doxycycline -No wheezing.  Early onset CAD s/p CABG 4v (2022) s/p PCI w/ stent to LAD in 2012, s/p PCI DES to Lcx 2022.  Prior history of LV thrombus. -Follows with Dr. Shirlee Latch.  -2D echo 01/2023 had shown EF of 30 to 35%, global hypokinesis, mild dysfunction.   -Underwent L/RHC 01/2023 occluded grafts except LIMA-LAD. Occluded native RCA. Patent m Lcx stent.  -Continue digoxin, spironolactone, lokelma, Entresto -Hold Farxiga, Lasix due to poor p.o. intake in the last several days -Continue atorvastatin - No thrombus on 12/05/20 cardiac MRI. - Plavix stopped 09/2021 due to GI bleed postop.   - on Eliquis 5 mg twice daily--> ON HOLD    Paroxysmal atrial fibrillation -Had undergone DCCV 11/2020, CHA2DS2VASc of at least 4.  -On Eliquis outpatient  5 mg twice daily, patient has not taken it for 2 days PTA, currently on hold -Off amiodarone   Constipation, internal hemorrhoids -Placed on daily MiraLAX with Colace, Anusol suppositories  Estimated body mass index is 27.85 kg/m as calculated from the following:   Height as of this encounter: 6\' 1"  (1.854 m).   Weight as of this encounter: 95.8 kg.  Code Status: Full CODE STATUS DVT Prophylaxis:     Level of Care: Level of care: Telemetry Medical Family Communication: Updated patient Disposition Plan:      Remains inpatient appropriate:      Procedures:    Consultants:   General surgery GI  Antimicrobials:   Anti-infectives (From admission, onward)    Start     Dose/Rate Route Frequency  Ordered Stop   03/20/23 0600  cefTRIAXone (ROCEPHIN) 2 g in sodium chloride 0.9 % 100 mL IVPB        2 g 200 mL/hr over 30 Minutes Intravenous Every 24 hours 03/19/23 0518 03/25/23 0559   03/20/23 0300  doxycycline (VIBRAMYCIN) 100 mg in sodium chloride 0.9 % 250 mL IVPB        100 mg 125 mL/hr over 120 Minutes Intravenous Every 12 hours 03/19/23 0518 03/25/23 0259   03/19/23 0600  cefTRIAXone (ROCEPHIN) 1 g in sodium chloride 0.9 % 100 mL IVPB        1 g 200 mL/hr over 30 Minutes Intravenous  Once 03/19/23 0535 03/19/23 0707   03/19/23 0245  cefTRIAXone (ROCEPHIN) 1 g in sodium chloride 0.9 % 100 mL IVPB        1 g 200 mL/hr over 30 Minutes Intravenous  Once 03/19/23 0238 03/19/23 0325   03/19/23 0245  azithromycin (ZITHROMAX) 500 mg in sodium chloride 0.9 % 250 mL IVPB        500 mg 250 mL/hr over 60 Minutes Intravenous  Once 03/19/23 0239 03/19/23 1308          Medications  carvedilol  3.125 mg Oral BID WC   digoxin  125 mcg Oral Daily   omega-3 acid ethyl esters  2,000 mg Oral Daily   pantoprazole (PROTONIX) IV  40 mg Intravenous Q12H   sacubitril-valsartan  1 tablet Oral BID   sodium zirconium cyclosilicate  5 g Oral Daily   spironolactone  25 mg Oral Daily      Subjective:   Rhylee Manner was seen and examined today.  Continues to complain of RUQ and epigastric abdominal pain.  Also states he had dark stool last night with some bright red blood on wiping.  Also had the same 3 days PTA.  Denied any chest pain, shortness of breath, abdominal pain, N/V.   Objective:   Vitals:   03/19/23 2335 03/20/23 0023 03/20/23 0025 03/20/23 0745  BP:  (!) 91/56 94/64 97/64   Pulse: 75 67 67   Resp: 20 18 20 18   Temp:  98 F (36.7 C)  (!) 97.5 F (36.4 C)  TempSrc:  Oral  Oral  SpO2: 93% 97% 96%   Weight:      Height:        Intake/Output Summary (Last 24 hours) at 03/20/2023 1056 Last data filed at 03/20/2023 0300 Gross per 24 hour  Intake 840 ml  Output --  Net 840 ml      Wt Readings from Last 3 Encounters:  03/19/23 95.8 kg  03/04/23 93.9 kg  02/19/23 98.9 kg     Exam General: Alert and oriented x 3, NAD Cardiovascular:  S1 S2 auscultated,  RRR Respiratory: Clear to auscultation bilaterally, no wheezing Gastrointestinal: Soft, mild RUQ TTP, epigastric TTP, ND, NBS Ext: no pedal edema bilaterally Neuro: Strength 5/5 upper and lower extremities bilaterally Psych: Normal affect     Data Reviewed:  I have personally reviewed following labs    CBC Lab Results  Component Value Date   WBC 5.7 03/20/2023   RBC 4.84 03/20/2023   HGB 14.5 03/20/2023   HCT 44.3 03/20/2023   MCV 91.5 03/20/2023   MCH 30.0 03/20/2023   PLT 199 03/20/2023   MCHC 32.7 03/20/2023   RDW 13.7 03/20/2023   LYMPHSABS 2.1 03/20/2023   MONOABS 0.5 03/20/2023   EOSABS 0.3 03/20/2023   BASOSABS 0.1 03/20/2023     Last metabolic panel Lab Results  Component Value Date   NA 136 03/20/2023   K 5.1 03/20/2023   CL 106 03/20/2023   CO2 23 03/20/2023   BUN 14 03/20/2023   CREATININE 0.88 03/20/2023   GLUCOSE 83 03/20/2023   GFRNONAA >60 03/20/2023   GFRAA  08/31/2007    >60        The eGFR has been calculated using the MDRD equation. This calculation has not been validated in all clinical   CALCIUM 8.8 (L) 03/20/2023   PROT 6.2 (L) 03/19/2023   ALBUMIN 3.3 (L) 03/19/2023   BILITOT 0.8 03/19/2023   ALKPHOS 75 03/19/2023   AST 25 03/19/2023   ALT 28 03/19/2023   ANIONGAP 7 03/20/2023    CBG (last 3)  No results for input(s): "GLUCAP" in the last 72 hours.    Coagulation Profile: No results for input(s): "INR", "PROTIME" in the last 168 hours.   Radiology Studies: I have personally reviewed the imaging studies  NM Hepato W/EF Result Date: 03/19/2023 CLINICAL DATA:  Biliary colic EXAM: NUCLEAR MEDICINE HEPATOBILIARY IMAGING WITH GALLBLADDER EF TECHNIQUE: Sequential images of the abdomen were obtained out to 60 minutes following intravenous  administration of radiopharmaceutical. After oral ingestion of Ensure, gallbladder ejection fraction was determined. At 60 min, normal ejection fraction is greater than 33%. RADIOPHARMACEUTICALS:  5.4 mCi Tc-70m  Choletec IV COMPARISON:  None Available. FINDINGS: Prompt uptake and biliary excretion of activity by the liver is seen. Gallbladder activity is visualized, consistent with patency of cystic duct. Biliary activity passes into small bowel, consistent with patent common bile duct. Calculated gallbladder ejection fraction is 79%. (Normal gallbladder ejection fraction with Ensure is greater than 33% and less than 80%.) IMPRESSION: Gallbladder ejection fraction 79%. No common duct or cystic duct obstruction. Electronically Signed   By: Karen Kays M.D.   On: 03/19/2023 13:40   CT Angio Chest PE W and/or Wo Contrast Result Date: 03/19/2023 CLINICAL DATA:  Shortness of breath, vomiting, right upper quadrant pain. EXAM: CT ANGIOGRAPHY CHEST CT ABDOMEN AND PELVIS WITH CONTRAST TECHNIQUE: Multidetector CT imaging of the chest was performed using the standard protocol during bolus administration of intravenous contrast. Multiplanar CT image reconstructions and MIPs were obtained to evaluate the vascular anatomy. Multidetector CT imaging of the abdomen and pelvis was performed using the standard protocol during bolus administration of intravenous contrast. RADIATION DOSE REDUCTION: This exam was performed according to the departmental dose-optimization program which includes automated exposure control, adjustment of the mA and/or kV according to patient size and/or use of iterative reconstruction technique. CONTRAST:  75mL OMNIPAQUE IOHEXOL 350 MG/ML SOLN COMPARISON:  Right upper quadrant ultrasound dated 03/18/2023. CT abdomen/pelvis dated 02/14/2023. FINDINGS: CTA CHEST FINDINGS Cardiovascular: Satisfactory opacification of the bilateral  pulmonary arteries to the segmental level. No evidence of pulmonary  embolism. Although not tailored for evaluation of the thoracic aorta, there is no evidence of thoracic aortic aneurysm or dissection. Heart is normal in size.  No pericardial effusion. Mild three-vessel coronary atherosclerosis. Postsurgical changes related to prior CABG. Mediastinum/Nodes: No suspicious mediastinal lymphadenopathy. Visualized thyroid is unremarkable. Lungs/Pleura: Mild patchy/nodular opacities in the posterior right upper lobe (series 4/image 48), suggesting mild infection/pneumonia. No suspicious pulmonary nodules. No pleural effusion or pneumothorax. Musculoskeletal: Visualized osseous structures are within normal limits. Median sternotomy. Review of the MIP images confirms the above findings. CT ABDOMEN and PELVIS FINDINGS Hepatobiliary: Liver is within normal limits. Gallbladder is unremarkable. No intrahepatic or extrahepatic ductal dilatation. Pancreas: Within normal limits. Spleen: Within normal limits Adrenals/Urinary Tract: Adrenal glands are within normal limits. Kidneys are within normal limits.  No hydronephrosis. Bladder is within normal limits. Stomach/Bowel: Stomach is notable for a tiny hiatal hernia. No evidence of bowel obstruction. Normal appendix (series 6/image 31). No colonic wall thickening or inflammatory changes. Vascular/Lymphatic: No evidence of abdominal aortic aneurysm. No suspicious abdominopelvic lymphadenopathy. Reproductive: Prostate is unremarkable. Other: No abdominopelvic ascites. Small left and trace right fat containing inguinal hernias. Musculoskeletal: Visualized osseous structures are within normal limits. Review of the MIP images confirms the above findings. IMPRESSION: No evidence of pulmonary embolism. Mild right upper lobe pneumonia. Gallbladder is within normal limits on CT. Electronically Signed   By: Charline Bills M.D.   On: 03/19/2023 02:04   CT ABDOMEN PELVIS W CONTRAST Result Date: 03/19/2023 CLINICAL DATA:  Shortness of breath, vomiting,  right upper quadrant pain. EXAM: CT ANGIOGRAPHY CHEST CT ABDOMEN AND PELVIS WITH CONTRAST TECHNIQUE: Multidetector CT imaging of the chest was performed using the standard protocol during bolus administration of intravenous contrast. Multiplanar CT image reconstructions and MIPs were obtained to evaluate the vascular anatomy. Multidetector CT imaging of the abdomen and pelvis was performed using the standard protocol during bolus administration of intravenous contrast. RADIATION DOSE REDUCTION: This exam was performed according to the departmental dose-optimization program which includes automated exposure control, adjustment of the mA and/or kV according to patient size and/or use of iterative reconstruction technique. CONTRAST:  75mL OMNIPAQUE IOHEXOL 350 MG/ML SOLN COMPARISON:  Right upper quadrant ultrasound dated 03/18/2023. CT abdomen/pelvis dated 02/14/2023. FINDINGS: CTA CHEST FINDINGS Cardiovascular: Satisfactory opacification of the bilateral pulmonary arteries to the segmental level. No evidence of pulmonary embolism. Although not tailored for evaluation of the thoracic aorta, there is no evidence of thoracic aortic aneurysm or dissection. Heart is normal in size.  No pericardial effusion. Mild three-vessel coronary atherosclerosis. Postsurgical changes related to prior CABG. Mediastinum/Nodes: No suspicious mediastinal lymphadenopathy. Visualized thyroid is unremarkable. Lungs/Pleura: Mild patchy/nodular opacities in the posterior right upper lobe (series 4/image 48), suggesting mild infection/pneumonia. No suspicious pulmonary nodules. No pleural effusion or pneumothorax. Musculoskeletal: Visualized osseous structures are within normal limits. Median sternotomy. Review of the MIP images confirms the above findings. CT ABDOMEN and PELVIS FINDINGS Hepatobiliary: Liver is within normal limits. Gallbladder is unremarkable. No intrahepatic or extrahepatic ductal dilatation. Pancreas: Within normal limits.  Spleen: Within normal limits Adrenals/Urinary Tract: Adrenal glands are within normal limits. Kidneys are within normal limits.  No hydronephrosis. Bladder is within normal limits. Stomach/Bowel: Stomach is notable for a tiny hiatal hernia. No evidence of bowel obstruction. Normal appendix (series 6/image 31). No colonic wall thickening or inflammatory changes. Vascular/Lymphatic: No evidence of abdominal aortic aneurysm. No suspicious abdominopelvic lymphadenopathy. Reproductive: Prostate is unremarkable. Other: No abdominopelvic ascites.  Small left and trace right fat containing inguinal hernias. Musculoskeletal: Visualized osseous structures are within normal limits. Review of the MIP images confirms the above findings. IMPRESSION: No evidence of pulmonary embolism. Mild right upper lobe pneumonia. Gallbladder is within normal limits on CT. Electronically Signed   By: Charline Bills M.D.   On: 03/19/2023 02:04   US ABDOMEN LIMITED RUQ (LIVER/GB) Result Date: 03/18/2023 CLINICAL DATA:  Right upper quadrant abdominal pain. EXAM: ULTRASOUND ABDOMEN LIMITED RIGHT UPPER QUADRANT COMPARISON:  Ultrasound dated 02/18/2023. FINDINGS: Gallbladder: No gallstones or wall thickening visualized. No sonographic Murphy sign noted by sonographer. Common bile duct: Diameter: 5 mm Liver: The liver is unremarkable. Portal vein is patent on color Doppler imaging with normal direction of blood flow towards the liver. Other: None. IMPRESSION: Unremarkable right upper quadrant ultrasound. Electronically Signed   By: Elgie Collard M.D.   On: 03/18/2023 17:35       Jerolene Kupfer M.D. [facility] 03/20/2023, 10:56 AM  Available via Epic secure chat 7am-7pm After 7 pm, please refer to night coverage provider listed on amion.

## 2023-03-20 NOTE — Consult Note (Signed)
Consultation Note   Referring Provider:  [facility] PCP: Toma Deiters, MD Primary Gastroenterologist: Imogene Burn, MD        Reason for Consultation:  Abdominal pain  DOA: 03/18/2023         Hospital Day: 3   ASSESSMENT    Brief Narrative:  38 y.o. year old male with early onset CAD status post CABG, LV thrombus, chronic systolic CHF, PAF, [facility] of colon cancer in father at age 44 and cholelithiasis. Admitted yesterday with upper abdominal pain, nausea / vomiting. Found to have RUL PNA. Also reported black stools and rectal bleeding  Intermittent RUQ pain since Thanksgiving. Cause unknown.  Cystic duct stone on CT scan in December at ED in [place]. CT scan this admission is negative. [place] here is negative without cholelithiasis, cholecystitis or biliary duct dilation. HIDA negative.   Chronic constipation, takes Linzess as needed at home    Reported dark stools and rectal bleeding No findings on EGD / colonoscopy in May 2023 for same symptoms. Suspect intermittent internal hemorrhoid bleeding. Unclear why stools darker than baseline. Upper GI bleed seems unlikely with normal hgb and normal BUN.  Elevated troponin  27 >>23  Chronic systolic CHF.  Last EF measured was 30 to 35%  in December   PAF / chronic anticoagulation  Eliquis on hold   CAP with mild RUL PNA on chest CT On Doxycycline and Rocephin   Principal Problem:   RUQ pain Active Problems:   Chest pain   S/P CABG x 4   Coronary artery disease   Hypercholesterolemia   Chronic systolic CHF (congestive heart failure) (HCC)   Ischemic cardiomyopathy   CAP (community acquired pneumonia)      PLAN:   --Continue BID Pantoprazole --Continue Anusol suppositories  --Continue Miralax and colace --If Carafate doesn't seem to be helping pain would d/c in next couple of days as it can exacerbate constipation  --EGD would likely be low yield but will consider. Not  sure dark stools represent melena. I didn't order hemoccult as it would probably be positive if having hemorrhoidal bleeding   HPI    Brief GI History:  Patient seen in or office in 2023 for blood in stools / black stool. Underwent EGD and colonoscopy with findings of mild gastric erosions on biopsy and bowel prep related changes in colon.   Interval History :  Patient was at [facility] ED 02/14/23 with RUQ pain and a CT scan showed a stone in the cystic duct and mild gallbladder distention without evidence for cholecystitis. ALT was minimally elevated. LFTs otherwise normal. He was scheduled to have a lap chole on 1/29  Onur came to ED  yesterday for [facility] and RUQ pain.  Apparently has was also seen at ED in [facility] the day prior. The RUQ pain started right after Thanksgiving. It is intermittent and has no relationship to eating. The pain can radiate through to his back. In the last few days he has started having nausea and vomiting. Emesis non-bloody. The episodes of pain can last 1-2 hours. Recently has started having rectal bleeding with bowel movements again. Denies recent constipation. Over last few days the blood has become more maroon in color and stools a  little darker than normal. BUN is normal. No bismuth use.  On a PPI at home.   No acute GI findings on CTA, [place] or HIDA ( normal EF). This admission, LFTs are normal   Surgery evaluated yesterday and no need for surgical intervention   Previous GI Evaluations   CT AP with contrast 02/14/23 [facility] 1. Cholelithiasis with 5 mm stone impacted within the proximal  cystic duct. Mild gallbladder distention, however, no superimposed  pericholecystic inflammatory changes or fluid collections are seen  at this time.  2. Mild cardiomegaly.  Colonoscopy for rectal bleeding  and EGD for melena 07/30/21 Colonoscopy   - The examined portion of the ileum was normal. - Two 3 to 4 mm polyps in the transverse colon and in the cecum, removed with a  cold snare. Resected and retrieved. - Localized inflammation was found in the sigmoid colon. - Four 3 to 10 mm polyps in the sigmoid colon, removed with a cold snare. Resected and retrieved. - Non-bleeding internal hemorrhoids  EGD -Gastritis, otherwise normal  Diagnosis 1. Surgical [P], gastric random FRAGMENTS OF GASTRIC MUCOSA WITH SUPERFICIAL MUCOSAL EROSIONS AND MINIMAL VASCULAR ECTASIA. H. PYLORI, INTESTINAL METAPLASIA, ATROPHY AND DYSPLASIA ARE NOT IDENTIFIED. 2. Surgical [P], right colon biopsies MILD NONSPECIFIC INFLAMMATION CONSISTENT WITH PREP RELATED CHANGES. ONE FRAGMENT HAS A PORTION OF A SUBMUCOSAL LIPOMA. THERE ARE NO DIAGNOSTIC FEATURES OF INFLAMMATORY BOWEL DISEASE, MICROSCOPIC COLITIS AND COLLAGENOUS COLITIS. 3. Surgical [P], colon, cecum, transverse, polyp (2) INFLAMMATORY PSEUDOPOLYP AND A HYPERPLASTIC POLYP.Marland Kitchen NEGATIVE FOR DYSPLASIA. 4. Surgical [P], colon, transverse MILD NONSPECIFIC INFLAMMATION CONSISTENT WITH PREP RELATED CHANGES. THERE ARE NO DIAGNOSTIC FEATURES OF INFLAMMATORY BOWEL DISEASE, MICROSCOPIC COLITIS AND COLLAGENOUS COLITIS. 5. Surgical [P], left colon biopsies MILD NONSPECIFIC INFLAMMATION CONSISTENT WITH PREP RELATED CHANGES. THERE ARE NO DIAGNOSTIC FEATURES OF INFLAMMATORY BOWEL DISEASE, MICROSCOPIC COLITIS AND COLLAGENOUS COLITIS. 6. Surgical [P], colon, sigmoid x4, polyp (4) HYPERPLASTIC POLYPS WITH UNDERLYING HYPERPLASTIC LYMPHOID NODULES. NEGATIVE FOR DYSPLASIA.   Labs and Imaging:  HIDA IMPRESSION: Gallbladder ejection fraction 79%. No common duct or cystic duct obstruction.  RUQ US Unremarkable   CT AP with contrast --No acute findings.    Recent Labs    03/18/23 1620 03/19/23 0536 03/20/23 0343  WBC 10.6* 8.8 5.7  HGB 16.1 14.5 14.5  HCT 49.9 45.7 44.3  PLT 253 194 199   Recent Labs    03/18/23 1620 03/19/23 0536 03/20/23 0343  NA 137 138 136  K 5.1 4.2 5.1  CL 107 103 106  CO2 19* 24 23  GLUCOSE 86 89 83   BUN 22* 17 14  CREATININE 1.16 1.09 0.88  CALCIUM 9.5 9.0 8.8*   Recent Labs    03/19/23 0536  PROT 6.2*  ALBUMIN 3.3*  AST 25  ALT 28  ALKPHOS 75  BILITOT 0.8  BILIDIR 0.1  IBILI 0.7   No results for input(s): "HEPBSAG", "HCVAB", "HEPAIGM", "HEPBIGM" in the last 72 hours. No results for input(s): "LABPROT", "INR" in the last 72 hours.    Past Medical History:  Diagnosis Date   Atrial fibrillation (HCC)    Atypical chest pain    CAD (coronary artery disease)    CHF (congestive heart failure) (HCC)    Dyslipidemia    Ex-smoker    History of depression    Hyperlipidemia    Insomnia    Ischemic cardiomyopathy    Ejection fraction 40-45%   NSTEMI (non-ST elevated myocardial infarction) (HCC) 08/01/2007   Treated with a bare metal stent to  the proximal LAD, pt states MI x3   Suicide attempt (HCC) 01/30/2001    Past Surgical History:  Procedure Laterality Date   ADENOIDECTOMY     CARDIOVERSION N/A 12/06/2020   Procedure: CARDIOVERSION;  Surgeon: Laurey Morale, MD;  Location: [facility] ENDOSCOPY;  Service: Cardiovascular;  Laterality: N/A;   CCM GENERATOR AND A/V LEAD INSERTION N/A 04/17/2022   Procedure: CCM GENERATOR AND A/V LEAD INSERTION;  Surgeon: Lanier Prude, MD;  Location: [facility] INVASIVE CV LAB;  Service: Cardiovascular;  Laterality: N/A;   COLONOSCOPY     CORONARY ARTERY BYPASS GRAFT N/A 07/03/2020   Procedure: CORONARY ARTERY BYPASS GRAFTING (CABG) times four using left internal mammary artery, right arm radial artery and right leg saphenous vein;  Surgeon: Corliss Skains, MD;  Location: [facility] OR;  Service: Open Heart Surgery;  Laterality: N/A;   CORONARY STENT INTERVENTION N/A 12/03/2020   Procedure: CORONARY STENT INTERVENTION;  Surgeon: Swaziland, Peter M, MD;  Location: [facility] INVASIVE CV LAB;  Service: Cardiovascular;  Laterality: N/A;   CORONARY STENT PLACEMENT     Bare metal stent to proximal LAD   ICD IMPLANT N/A 02/10/2021   Procedure: ICD IMPLANT;   Surgeon: Lanier Prude, MD;  Location: [facility] INVASIVE CV LAB;  Service: Cardiovascular;  Laterality: N/A;   LEFT HEART CATH AND CORONARY ANGIOGRAPHY N/A 07/01/2020   Procedure: LEFT HEART CATH AND CORONARY ANGIOGRAPHY;  Surgeon: Runell Gess, MD;  Location: [facility] INVASIVE CV LAB;  Service: Cardiovascular;  Laterality: N/A;   LEFT HEART CATH AND CORS/GRAFTS ANGIOGRAPHY N/A 12/03/2020   Procedure: LEFT HEART CATH AND CORS/GRAFTS ANGIOGRAPHY;  Surgeon: Swaziland, Peter M, MD;  Location: [facility] INVASIVE CV LAB;  Service: Cardiovascular;  Laterality: N/A;   RADIAL ARTERY HARVEST Right 07/03/2020   Procedure: RADIAL ARTERY HARVEST;  Surgeon: Corliss Skains, MD;  Location: [facility] OR;  Service: Open Heart Surgery;  Laterality: Right;   RIGHT/LEFT HEART CATH AND CORONARY/GRAFT ANGIOGRAPHY N/A 02/19/2023   Procedure: RIGHT/LEFT HEART CATH AND CORONARY/GRAFT ANGIOGRAPHY;  Surgeon: Laurey Morale, MD;  Location: [facility] INVASIVE CV LAB;  Service: Cardiovascular;  Laterality: N/A;   TEE WITHOUT CARDIOVERSION N/A 07/03/2020   Procedure: TRANSESOPHAGEAL ECHOCARDIOGRAM (TEE);  Surgeon: Corliss Skains, MD;  Location: [facility] OR;  Service: Open Heart Surgery;  Laterality: N/A;   TYMPANOSTOMY TUBE PLACEMENT     when I was a kid   UPPER GASTROINTESTINAL ENDOSCOPY      Family History  Problem Relation Age of Onset   Colon cancer Father 52   Colon cancer Paternal Grandfather    Colon cancer Paternal Aunt    Hypertension Neg Hx    Diabetes Neg Hx    Coronary artery disease Neg Hx    Esophageal cancer Neg Hx    Rectal cancer Neg Hx    Stomach cancer Neg Hx     Prior to Admission medications   Medication Sig Start Date End Date Taking? Authorizing Provider  apixaban (ELIQUIS) 5 MG TABS tablet Take 1 tablet (5 mg total) by mouth 2 (two) times daily. 10/24/22  Yes Modena Slater, DO  atorvastatin (LIPITOR) 80 MG tablet Take 1 tablet (80 mg total) by mouth daily. 03/12/23  Yes Laurey Morale, MD  carvedilol (COREG)  3.125 MG tablet TAKE ONE TABLET BY MOUTH TWICE DAILY WITH MEALS 05/04/22  Yes Graciella Freer, PA-C  dapagliflozin propanediol (FARXIGA) 10 MG TABS tablet Take 1 tablet (10 mg total) by mouth daily before breakfast. 11/30/22  Yes Laurey Morale,  MD  digoxin (LANOXIN) 0.125 MG tablet Take 1 tablet (125 mcg total) by mouth daily. 02/22/23 02/22/24 Yes Laurey Morale, MD  furosemide (LASIX) 20 MG tablet Take 20 mg by mouth daily.   Yes [provider]  linaclotide (LINZESS) 290 MCG CAPS capsule Take 1 capsule (290 mcg total) by mouth daily before breakfast. Patient taking differently: Take 290 mcg by mouth daily as needed (for constipation). 08/04/22  Yes Imogene Burn, MD  nitroGLYCERIN (NITROSTAT) 0.4 MG SL tablet Place 1 tablet (0.4 mg total) under the tongue every 5 (five) minutes x 3 doses as needed for chest pain. 01/27/22  Yes Laurey Morale, MD  Omega-3 Fatty Acids (FISH OIL) 500 MG CAPS Take 2,000 mg by mouth daily.   Yes [provider]  ondansetron (ZOFRAN-ODT) 4 MG disintegrating tablet Take 4 mg by mouth every 8 (eight) hours as needed for nausea or vomiting.   Yes [provider]  oxyCODONE-acetaminophen (PERCOCET/ROXICET) 5-325 MG tablet Take 1 tablet by mouth every 4 (four) hours as needed for severe pain (pain score 7-10). 02/18/23  Yes Al Decant, PA-C  pantoprazole (PROTONIX) 40 MG tablet Take 1 tablet (40 mg total) by mouth daily. 12/02/22 02/17/24 Yes Laurey Morale, MD  sacubitril-valsartan (ENTRESTO) 24-26 MG Take 1 tablet by mouth 2 (two) times daily. 11/23/22  Yes Laurey Morale, MD  sodium zirconium cyclosilicate (LOKELMA) 5 g packet Take 5 g by mouth daily. 02/17/23  Yes Laurey Morale, MD  spironolactone (ALDACTONE) 25 MG tablet Take 1 tablet (25 mg total) by mouth daily. 02/19/23  Yes Jonita Albee, PA-C    Current Facility-Administered Medications  Medication Dose Route Frequency Provider Last Rate Last Admin    bisacodyl (DULCOLAX) suppository 10 mg  10 mg Rectal Daily PRN Marolyn Haller, MD       calcium carbonate (TUMS - dosed in mg elemental calcium) chewable tablet 400 mg of elemental calcium  2 tablet Oral Q4H PRN Marolyn Haller, MD       carvedilol (COREG) tablet 3.125 mg  3.125 mg Oral BID WC Eduard Clos, MD   3.125 mg at 03/20/23 0734   cefTRIAXone (ROCEPHIN) 2 g in sodium chloride 0.9 % 100 mL IVPB  2 g Intravenous Q24H Eduard Clos, MD 200 mL/hr at 03/20/23 0623 2 g at 03/20/23 4098   digoxin (LANOXIN) tablet 125 mcg  125 mcg Oral Daily Eduard Clos, MD   125 mcg at 03/20/23 0745   docusate sodium (COLACE) capsule 100 mg  100 mg Oral BID Rai, Ripudeep K, MD   100 mg at 03/20/23 1134   doxycycline (VIBRAMYCIN) 100 mg in sodium chloride 0.9 % 250 mL IVPB  100 mg Intravenous Q12H Eduard Clos, MD 125 mL/hr at 03/20/23 0343 100 mg at 03/20/23 0343   hydrocortisone (ANUSOL-HC) suppository 25 mg  25 mg Rectal BID Rai, Ripudeep K, MD       HYDROmorphone (DILAUDID) injection 0.5 mg  0.5 mg Intravenous Q4H PRN Marolyn Haller, MD   0.5 mg at 03/20/23 0734   lactulose (CHRONULAC) enema 200 gm  300 mL Rectal Once PRN Marolyn Haller, MD       omega-3 acid ethyl esters (LOVAZA) capsule 2,000 mg  2,000 mg Oral Daily Eduard Clos, MD   2,000 mg at 03/20/23 0744   pantoprazole (PROTONIX) injection 40 mg  40 mg Intravenous Q12H Marolyn Haller, MD   40 mg at 03/20/23 0744   polyethylene glycol (MIRALAX /  GLYCOLAX) packet 17 g  17 g Oral Daily Rai, Ripudeep K, MD   17 g at 03/20/23 1135   sacubitril-valsartan (ENTRESTO) 24-26 mg per tablet  1 tablet Oral BID Eduard Clos, MD   1 tablet at 03/19/23 2110   sodium zirconium cyclosilicate (LOKELMA) packet 5 g  5 g Oral Daily Eduard Clos, MD   5 g at 03/20/23 1610   spironolactone (ALDACTONE) tablet 25 mg  25 mg Oral Daily Eduard Clos, MD   25 mg at 03/20/23 0745    Allergies as of  03/18/2023 - Review Complete 03/18/2023  Allergen Reaction Noted   Codeine Itching 12/27/2007    Social History   Socioeconomic History   Marital status: Single    Spouse name: Not on file   Number of children: Not on file   Years of education: Not on file   Highest education level: High school graduate  Occupational History   Occupation: Not actively working    Comment: applied for disability x 2.   Tobacco Use   Smoking status: Former    Current packs/day: 0.00    Average packs/day: 1 pack/day for 22.0 years (22.0 ttl pk-yrs)    Types: Cigarettes, Cigars    Start date: 06/29/1998    Quit date: 06/28/2020    Years since quitting: 2.7   Smokeless tobacco: Never   Tobacco comments:    cigarettes stopped in 2020, [employer] started in 2018-04-05/day  Vaping Use   Vaping status: Never Used  Substance and Sexual Activity   Alcohol use: Never   Drug use: Not Currently    Types: Marijuana   Sexual activity: Never  Other Topics Concern   Not on file  Social History Narrative   Single   Lives with his parents   Trying to get his GED   DeGent date all cultures   Social Drivers of Health   Financial Resource Strain: Medium Risk (12/04/2020)   Overall Financial Resource Strain (CARDIA)    Difficulty of Paying Living Expenses: Somewhat hard  Food Insecurity: No Food Insecurity (03/19/2023)   Hunger Vital Sign    Worried About Running Out of Food in the Last Year: Never true    Ran Out of Food in the Last Year: Never true  Transportation Needs: No Transportation Needs (03/19/2023)   PRAPARE - Administrator, Civil Service (Medical): No    Lack of Transportation (Non-Medical): No  Physical Activity: Not on file  Stress: Not on file  Social Connections: Not on file  Intimate Partner Violence: Not At Risk (03/19/2023)   Humiliation, Afraid, Rape, and Kick questionnaire    Fear of Current or Ex-Partner: No    Emotionally Abused: No    Physically Abused: No     Sexually Abused: No     Code Status   Code Status: Full Code  Review of Systems: All systems reviewed and negative except where noted in HPI.  Physical Exam: Vital signs in last 24 hours: Temp:  [97.4 F (36.3 C)-98.2 F (36.8 C)] 97.4 F (36.3 C) (01/18 1107) Pulse Rate:  [54-87] 54 (01/18 1107) Resp:  [12-20] 18 (01/18 1107) BP: (91-129)/(56-85) 98/71 (01/18 1107) SpO2:  [93 %-100 %] 95 % (01/18 1107) Weight:  [95.8 kg] 95.8 kg (01/17 1550) Last BM Date : 03/19/23  General:  Pleasant male in NAD Psych:  Cooperative. Normal mood and affect Eyes: Pupils equal Ears:  Normal auditory acuity Nose: No deformity, discharge or lesions Neck:  Supple, no masses felt Lungs:  Clear to auscultation.  Heart:  Regular rate, regular rhythm.  Abdomen:  Soft, nondistended, nontender, active bowel sounds, no masses felt Rectal :  Deferred Msk: Symmetrical without gross deformities.  Neurologic:  Alert, oriented, grossly normal neurologically Extremities : No edema Skin:  Intact without significant lesions.    Intake/Output from previous day: 01/17 0701 - 01/18 0700 In: 840 [P.O.:840] Out: -  Intake/Output this shift:  No intake/output data recorded.   Willette Cluster, NP-C   03/20/2023, 11:38 AM

## 2023-03-20 NOTE — Plan of Care (Signed)

## 2023-03-21 DIAGNOSIS — Z7901 Long term (current) use of anticoagulants: Secondary | ICD-10-CM | POA: Diagnosis not present

## 2023-03-21 DIAGNOSIS — R112 Nausea with vomiting, unspecified: Secondary | ICD-10-CM | POA: Diagnosis not present

## 2023-03-21 DIAGNOSIS — J189 Pneumonia, unspecified organism: Secondary | ICD-10-CM | POA: Diagnosis not present

## 2023-03-21 DIAGNOSIS — R1011 Right upper quadrant pain: Secondary | ICD-10-CM | POA: Diagnosis not present

## 2023-03-21 LAB — CBC
HCT: 45.2 % (ref 39.0–52.0)
Hemoglobin: 14.7 g/dL (ref 13.0–17.0)
MCH: 29.9 pg (ref 26.0–34.0)
MCHC: 32.5 g/dL (ref 30.0–36.0)
MCV: 91.9 fL (ref 80.0–100.0)
Platelets: 194 10*3/uL (ref 150–400)
RBC: 4.92 MIL/uL (ref 4.22–5.81)
RDW: 13.6 % (ref 11.5–15.5)
WBC: 8 10*3/uL (ref 4.0–10.5)
nRBC: 0 % (ref 0.0–0.2)

## 2023-03-21 LAB — TROPONIN I (HIGH SENSITIVITY)
Troponin I (High Sensitivity): 25 ng/L — ABNORMAL HIGH (ref <18)
Troponin I (High Sensitivity): 24 ng/L — ABNORMAL HIGH (ref <18)

## 2023-03-21 LAB — BASIC METABOLIC PANEL
Anion gap: 8 (ref 5–15)
BUN: 8 mg/dL (ref 6–20)
CO2: 26 mmol/L (ref 22–32)
Calcium: 9.3 mg/dL (ref 8.9–10.3)
Chloride: 103 mmol/L (ref 98–111)
Creatinine, Ser: 0.86 mg/dL (ref 0.61–1.24)
GFR, Estimated: >60 mL/min (ref >60)
Glucose, Bld: 100 mg/dL — ABNORMAL HIGH (ref 70–99)
Potassium: 5 mmol/L (ref 3.5–5.1)
Sodium: 137 mmol/L (ref 135–145)

## 2023-03-21 MED ORDER — POLYETHYLENE GLYCOL 3350 17 G PO PACK
17.0000 g | PACK | Freq: Two times a day (BID) | ORAL | Status: DC
  Administered 2023-03-21: 17 g via ORAL
  Filled 2023-03-21 (×2): qty 1

## 2023-03-21 MED ORDER — NITROGLYCERIN 0.4 MG SL SUBL
0.4000 mg | SUBLINGUAL_TABLET | SUBLINGUAL | Status: DC | PRN
  Administered 2023-03-21: 0.4 mg via SUBLINGUAL
  Filled 2023-03-21: qty 1

## 2023-03-21 MED ORDER — MORPHINE SULFATE (PF) 2 MG/ML IV SOLN
1.0000 mg | Freq: Once | INTRAVENOUS | Status: AC
  Administered 2023-03-21: 1 mg via INTRAVENOUS
  Filled 2023-03-21: qty 1

## 2023-03-21 MED ORDER — BISACODYL 10 MG RE SUPP
10.0000 mg | Freq: Once | RECTAL | Status: DC
  Filled 2023-03-21: qty 1

## 2023-03-21 NOTE — Plan of Care (Signed)

## 2023-03-21 NOTE — Plan of Care (Signed)
  Problem: Elimination: Goal: Will not experience complications related to bowel motility Outcome: Progressing Goal: Will not experience complications related to urinary retention Outcome: Progressing   Problem: Pain Managment: Goal: General experience of comfort will improve and/or be controlled Outcome: Progressing   Problem: Safety: Goal: Ability to remain free from injury will improve Outcome: Progressing

## 2023-03-21 NOTE — Progress Notes (Signed)
[facility]                                                                              Wyatt Donovan, is a 38 y.o. male, DOB - 02-20-1986, ZOX:[id number] Admit date - 03/18/2023    Outpatient Primary MD for the patient is Toma Deiters, MD  LOS - 2  days  Chief Complaint  Patient presents with   Abdominal Pain       Brief summary   Patient is a 38 year old male with early onset CAD status post CABG, had initial MI in 2012 at the age of 31 with PCI to LAD, inferior lateral MI in 05/2020, LHC showed three-vessel disease, CABG x 4, postop GI bleeding after CABG, prior history of LV thrombus, chronic systolic CHF, prior smoker, paroxysmal atrial fibrillation presented with worsening right upper quadrant and epigastric abdominal pain with nausea and vomiting.  CT angiogram of the chest showed no PE but showed mild right upper lobe pneumonia.  CT abdomen pelvis with no gallbladder pathology or liver pathology.  HIDA scan showed normal EF with no evidence of cholecystitis.  Of note, patient had presented to ED in 01/2023 with RUQ abdominal pain, CT abdomen had shown stone in the cystic duct with mildly distended gallbladder with no evidence of cholecystitis)) Also reported some substernal chest pressure in ED, received sublingual nitro x 1 which alleviated his pain.   Assessment & Plan    Principal Problem:   RUQ, epigastric abdominal pain ?melena  -Unclear etiology, has undergone RUQ [place], CT abdomen pelvis with no gallbladder pathology or liver pathology.  HIDA scan showed normal EF with no evidence of cholecystitis.   -General Surgery was consulted, seen by Dr. Violeta Gelinas, had recommended HIDA scan. -HIDA scan showed normal EF with no evidence of cholecystitis or biliary dyskinesia.  Per surgery, no need for lap [place] based on findings. -Continue IV PPI, GI consulted, plan for EGD today  Active Problems: Right upper lobe pneumonia/community-acquired  pneumonia -Mild leukocytosis on admission 10.6, procalcitonin less than 0.1 -Continue ceftriaxone, doxycycline -Stable, no wheezing  Early onset CAD s/p CABG 4v (2022) s/p PCI w/ stent to LAD in 2012, s/p PCI DES to Lcx 2022.  Prior history of LV thrombus. -Follows with Dr. Shirlee Latch.  -2D echo 01/2023 had shown EF of 30 to 35%, global hypokinesis, mild dysfunction.   -Underwent L/RHC 01/2023 occluded grafts except LIMA-LAD. Occluded native RCA. Patent m Lcx stent.  -Continue digoxin, spironolactone, lokelma, Entresto -Hold Farxiga, Lasix due to poor p.o. intake in the last several days -Continue atorvastatin - No thrombus on 12/05/20 cardiac MRI. - Plavix stopped 09/2021 due to GI bleed postop.   - on Eliquis 5 mg twice daily--> ON HOLD    Paroxysmal atrial fibrillation -Had undergone DCCV 11/2020, CHA2DS2VASc of at least 4.  -On Eliquis outpatient 5 mg twice daily, patient has not taken it for 2 days PTA, currently on hold -Off amiodarone   Constipation, internal hemorrhoids -Placed on daily MiraLAX with Colace, Anusol suppositories  Estimated body mass index is 27.85 kg/m as calculated from the following:   Height as of this encounter: 6'  1" (1.854 m).   Weight as of this encounter: 95.8 kg.  Code Status: Full CODE STATUS DVT Prophylaxis:     Level of Care: Level of care: Telemetry Medical Family Communication: Updated patient Disposition Plan:      Remains inpatient appropriate:   Pending EGD today   Procedures:    Consultants:   General surgery GI  Antimicrobials:   Anti-infectives (From admission, onward)    Start     Dose/Rate Route Frequency Ordered Stop   03/20/23 0600  cefTRIAXone (ROCEPHIN) 2 g in sodium chloride 0.9 % 100 mL IVPB        2 g 200 mL/hr over 30 Minutes Intravenous Every 24 hours 03/19/23 0518 03/25/23 0559   03/20/23 0300  doxycycline (VIBRAMYCIN) 100 mg in sodium chloride 0.9 % 250 mL IVPB        100 mg 125 mL/hr over 120 Minutes  Intravenous Every 12 hours 03/19/23 0518 03/25/23 0259   03/19/23 0600  cefTRIAXone (ROCEPHIN) 1 g in sodium chloride 0.9 % 100 mL IVPB        1 g 200 mL/hr over 30 Minutes Intravenous  Once 03/19/23 0535 03/19/23 0707   03/19/23 0245  cefTRIAXone (ROCEPHIN) 1 g in sodium chloride 0.9 % 100 mL IVPB        1 g 200 mL/hr over 30 Minutes Intravenous  Once 03/19/23 0238 03/19/23 0325   03/19/23 0245  azithromycin (ZITHROMAX) 500 mg in sodium chloride 0.9 % 250 mL IVPB        500 mg 250 mL/hr over 60 Minutes Intravenous  Once 03/19/23 0239 03/19/23 7829          Medications  carvedilol  3.125 mg Oral BID WC   digoxin  125 mcg Oral Daily   docusate sodium  100 mg Oral BID   hydrocortisone  25 mg Rectal BID   omega-3 acid ethyl esters  2,000 mg Oral Daily   pantoprazole (PROTONIX) IV  40 mg Intravenous Q12H   polyethylene glycol  17 g Oral Daily   sacubitril-valsartan  1 tablet Oral BID   sodium zirconium cyclosilicate  5 g Oral Daily   spironolactone  25 mg Oral Daily      Subjective:   Wyatt Donovan was seen and examined today.  Seen this morning, still complaining of abdominal pain otherwise no acute nausea vomiting, chest pain, shortness of breath.  No fevers.  Awaiting EGD.  Objective:   Vitals:   03/20/23 2053 03/20/23 2200 03/20/23 2333 03/21/23 0412  BP: 115/70 114/73  110/68  Pulse: 62 61 67 64  Resp: 20 18  18   Temp:    97.9 F (36.6 C)  TempSrc:    Oral  SpO2: 97% 98% 91% 95%  Weight:      Height:        Intake/Output Summary (Last 24 hours) at 03/21/2023 1419 Last data filed at 03/21/2023 0835 Gross per 24 hour  Intake 974.53 ml  Output --  Net 974.53 ml     Wt Readings from Last 3 Encounters:  03/19/23 95.8 kg  03/04/23 93.9 kg  02/19/23 98.9 kg   Physical Exam General: Alert and oriented x 3, NAD Cardiovascular: S1 S2 clear, RRR.  Respiratory: CTAB, no wheezing Gastrointestinal: Soft, RUQ, epigastric mild TTP, nondistended, NBS Ext: no pedal  edema bilaterally Neuro: no new deficits Psych: Normal affect     Data Reviewed:  I have personally reviewed following labs    CBC Lab Results  Component Value  Date   WBC 5.7 03/20/2023   RBC 4.84 03/20/2023   HGB 14.5 03/20/2023   HCT 44.3 03/20/2023   MCV 91.5 03/20/2023   MCH 30.0 03/20/2023   PLT 199 03/20/2023   MCHC 32.7 03/20/2023   RDW 13.7 03/20/2023   LYMPHSABS 2.1 03/20/2023   MONOABS 0.5 03/20/2023   EOSABS 0.3 03/20/2023   BASOSABS 0.1 03/20/2023     Last metabolic panel Lab Results  Component Value Date   NA 137 03/21/2023   K 5.0 03/21/2023   CL 103 03/21/2023   CO2 26 03/21/2023   BUN 8 03/21/2023   CREATININE 0.86 03/21/2023   GLUCOSE 100 (H) 03/21/2023   GFRNONAA >60 03/21/2023   GFRAA  08/31/2007    >60        The eGFR has been calculated using the MDRD equation. This calculation has not been validated in all clinical   CALCIUM 9.3 03/21/2023   PROT 6.2 (L) 03/19/2023   ALBUMIN 3.3 (L) 03/19/2023   BILITOT 0.8 03/19/2023   ALKPHOS 75 03/19/2023   AST 25 03/19/2023   ALT 28 03/19/2023   ANIONGAP 8 03/21/2023    CBG (last 3)  No results for input(s): "GLUCAP" in the last 72 hours.    Coagulation Profile: No results for input(s): "INR", "PROTIME" in the last 168 hours.   Radiology Studies: I have personally reviewed the imaging studies  No results found.      Thad Ranger M.D. [facility] 03/21/2023, 2:19 PM  Available via Epic secure chat 7am-7pm After 7 pm, please refer to night coverage provider listed on amion.

## 2023-03-21 NOTE — Progress Notes (Signed)
Gastroenterology Inpatient Follow Up    Subjective: Patient has still not had a stool yet.  He developed some chest pain and left arm pain, which is currently being worked up to make sure that he is not having a heart attack.  Objective: Vital signs in last 24 hours: Temp:  [97.8 F (36.6 C)-97.9 F (36.6 C)] 97.8 F (36.6 C) (01/19 1452) Pulse Rate:  [53-67] 66 (01/19 1636) Resp:  [17-20] 17 (01/19 1636) BP: (107-116)/(61-79) 107/65 (01/19 1636) SpO2:  [91 %-98 %] 95 % (01/19 1452) Last BM Date : 03/19/23  Intake/Output from previous day: 01/18 0701 - 01/19 0700 In: 1244.5 [P.O.:927; I.V.:217.5; IV Piggyback:100] Out: -  Intake/Output this shift: No intake/output data recorded.  General appearance: alert and cooperative Resp: no increased WOB Cardio: regular rate GI: tender in the epigastric area  Lab Results: Recent Labs    03/19/23 0536 03/20/23 0343 03/21/23 1542  WBC 8.8 5.7 8.0  HGB 14.5 14.5 14.7  HCT 45.7 44.3 45.2  PLT 194 199 194   BMET Recent Labs    03/19/23 0536 03/20/23 0343 03/21/23 1201  NA 138 136 137  K 4.2 5.1 5.0  CL 103 106 103  CO2 24 23 26   GLUCOSE 89 83 100*  BUN 17 14 8   CREATININE 1.09 0.88 0.86  CALCIUM 9.0 8.8* 9.3   LFT Recent Labs    03/19/23 0536  PROT 6.2*  ALBUMIN 3.3*  AST 25  ALT 28  ALKPHOS 75  BILITOT 0.8  BILIDIR 0.1  IBILI 0.7   PT/INR No results for input(s): "LABPROT", "INR" in the last 72 hours. Hepatitis Panel No results for input(s): "HEPBSAG", "HCVAB", "HEPAIGM", "HEPBIGM" in the last 72 hours. C-Diff No results for input(s): "CDIFFTOX" in the last 72 hours.  Studies/Results: No results found.  Medications: I have reviewed the patient's current medications. Scheduled:  carvedilol  3.125 mg Oral BID WC   digoxin  125 mcg Oral Daily   docusate sodium  100 mg Oral BID   hydrocortisone  25 mg Rectal BID   omega-3 acid ethyl esters  2,000 mg Oral Daily   pantoprazole (PROTONIX) IV  40  mg Intravenous Q12H   polyethylene glycol  17 g Oral Daily   sacubitril-valsartan  1 tablet Oral BID   sodium zirconium cyclosilicate  5 g Oral Daily   spironolactone  25 mg Oral Daily   Continuous:  cefTRIAXone (ROCEPHIN)  IV 2 g (03/21/23 0865)   doxycycline (VIBRAMYCIN) IV 100 mg (03/21/23 1519)   HQI:[id number], calcium carbonate, HYDROmorphone (DILAUDID) injection, lactulose, nitroGLYCERIN  Assessment/Plan: 38 year old male with history of CAD s/p CABG, LV thrombus, chronic systolic CHF (EF 30 to 35%), A-fib on Eliquis, and family history of colon cancer presented with ab pain and N&V, found to have RUL PNA. We were consulted due to right upper quadrant abdominal pain. HIDA scan was negative. Right upper quadrant ultrasound was negative for cholelithiasis or cholecystitis. On CT scan it does look like the patient has significant stool burden throughout his colon. Thus I would favor that his abdominal pain is due to constipation. Patient described some maroon-colored stools and black stools recently, though his hemoglobin has continued to remain completely normal. His last EGD and colonoscopy in 2023 showed some mild gastritis, colon polyps, hemorrhoids, and prep related mild inflammation in the entire colon that was negative for inflammatory bowel disease.  Will increase his constipation therapies and plan for a suppository tomorrow.  We had originally  discussed doing an EGD today, but in the setting of his chest pain and left arm pain with ischemic evaluation ongoing, will plan to hold off on any endoscopic procedures for now.  Since his hemoglobin has remained completely stable, I do not suspect any significant GI bleed at this time.  I think it would be fine for the patient to restart his home Eliquis from a GI perspective.  -Continue PPI twice daily -Agree with Anusol HC suppository twice daily for 7 days -Increased his MiraLAX from daily to twice daily -Will add on a suppository for  tomorrow.  If this is not effective, then would plan for an enema. -Okay to restart the patient's home Eliquis from a GI perspective -No plans for any endoscopic procedure at this time since patient's hemoglobin has remained completely stable and due to the patient's new onset chest pain/left arm pain -We will arrange for outpatient GI follow-up -GI will sign off for now.  Please call back if any new questions arise.   LOS: 2 days   Imogene Burn 03/21/2023, 7:29 PM

## 2023-03-22 ENCOUNTER — Inpatient Hospital Stay (HOSPITAL_COMMUNITY): Payer: Medicaid Other

## 2023-03-22 DIAGNOSIS — Z7901 Long term (current) use of anticoagulants: Secondary | ICD-10-CM | POA: Diagnosis not present

## 2023-03-22 DIAGNOSIS — M79602 Pain in left arm: Secondary | ICD-10-CM

## 2023-03-22 DIAGNOSIS — Z951 Presence of aortocoronary bypass graft: Secondary | ICD-10-CM

## 2023-03-22 DIAGNOSIS — I480 Paroxysmal atrial fibrillation: Secondary | ICD-10-CM | POA: Diagnosis not present

## 2023-03-22 DIAGNOSIS — J189 Pneumonia, unspecified organism: Secondary | ICD-10-CM | POA: Diagnosis not present

## 2023-03-22 DIAGNOSIS — I255 Ischemic cardiomyopathy: Secondary | ICD-10-CM | POA: Diagnosis not present

## 2023-03-22 DIAGNOSIS — R112 Nausea with vomiting, unspecified: Secondary | ICD-10-CM | POA: Diagnosis not present

## 2023-03-22 DIAGNOSIS — R1011 Right upper quadrant pain: Secondary | ICD-10-CM | POA: Diagnosis not present

## 2023-03-22 DIAGNOSIS — R079 Chest pain, unspecified: Secondary | ICD-10-CM | POA: Diagnosis not present

## 2023-03-22 LAB — BASIC METABOLIC PANEL
Anion gap: 10 (ref 5–15)
BUN: 7 mg/dL (ref 6–20)
CO2: 22 mmol/L (ref 22–32)
Calcium: 9.1 mg/dL (ref 8.9–10.3)
Chloride: 106 mmol/L (ref 98–111)
Creatinine, Ser: 0.79 mg/dL (ref 0.61–1.24)
GFR, Estimated: >60 mL/min (ref >60)
Glucose, Bld: 92 mg/dL (ref 70–99)
Potassium: 4 mmol/L (ref 3.5–5.1)
Sodium: 138 mmol/L (ref 135–145)

## 2023-03-22 LAB — CBC
HCT: 44.6 % (ref 39.0–52.0)
Hemoglobin: 14.5 g/dL (ref 13.0–17.0)
MCH: 30.1 pg (ref 26.0–34.0)
MCHC: 32.5 g/dL (ref 30.0–36.0)
MCV: 92.7 fL (ref 80.0–100.0)
Platelets: 176 10*3/uL (ref 150–400)
RBC: 4.81 MIL/uL (ref 4.22–5.81)
RDW: 13.5 % (ref 11.5–15.5)
WBC: 6.7 10*3/uL (ref 4.0–10.5)
nRBC: 0 % (ref 0.0–0.2)

## 2023-03-22 SURGERY — ESOPHAGOGASTRODUODENOSCOPY (EGD) WITH PROPOFOL
Anesthesia: Monitor Anesthesia Care

## 2023-03-22 MED ORDER — PANTOPRAZOLE SODIUM 40 MG PO TBEC
40.0000 mg | DELAYED_RELEASE_TABLET | Freq: Two times a day (BID) | ORAL | Status: DC
  Administered 2023-03-22: 40 mg via ORAL
  Filled 2023-03-22: qty 1

## 2023-03-22 MED ORDER — LINACLOTIDE 290 MCG PO CAPS: 290.0000 ug | ORAL_CAPSULE | Freq: Every day | ORAL | 5 refills | Status: DC

## 2023-03-22 MED ORDER — POLYETHYLENE GLYCOL 3350 17 G PO PACK: 17.0000 g | PACK | Freq: Every day | ORAL | 3 refills | Status: DC

## 2023-03-22 MED ORDER — DOXYCYCLINE HYCLATE 100 MG PO TABS
100.0000 mg | ORAL_TABLET | Freq: Two times a day (BID) | ORAL | Status: DC
  Administered 2023-03-22: 100 mg via ORAL
  Filled 2023-03-22: qty 1

## 2023-03-22 MED ORDER — HYDROCODONE-ACETAMINOPHEN 5-325 MG PO TABS
1.0000 | ORAL_TABLET | ORAL | Status: DC | PRN
  Filled 2023-03-22: qty 2

## 2023-03-22 MED ORDER — PANTOPRAZOLE SODIUM 40 MG PO TBEC: 40.0000 mg | DELAYED_RELEASE_TABLET | Freq: Two times a day (BID) | ORAL | 3 refills | Status: DC

## 2023-03-22 MED ORDER — APIXABAN 5 MG PO TABS
5.0000 mg | ORAL_TABLET | Freq: Two times a day (BID) | ORAL | Status: DC
  Administered 2023-03-22: 5 mg via ORAL
  Filled 2023-03-22: qty 1

## 2023-03-22 MED ORDER — OXYCODONE-ACETAMINOPHEN 5-325 MG PO TABS: 1.0000 | ORAL_TABLET | ORAL | 0 refills | Status: DC | PRN

## 2023-03-22 MED ORDER — DOXYCYCLINE HYCLATE 100 MG PO TABS: 100.0000 mg | ORAL_TABLET | Freq: Two times a day (BID) | ORAL | 0 refills | Status: AC

## 2023-03-22 MED ORDER — HYDROCORTISONE ACETATE 25 MG RE SUPP: 25.0000 mg | Freq: Two times a day (BID) | RECTAL | 0 refills | Status: DC

## 2023-03-22 MED ORDER — ONDANSETRON 4 MG PO TBDP: 4.0000 mg | ORAL_TABLET | Freq: Three times a day (TID) | ORAL | 0 refills | Status: DC | PRN

## 2023-03-22 MED ORDER — CEFPODOXIME PROXETIL 200 MG PO TABS: 200.0000 mg | ORAL_TABLET | Freq: Two times a day (BID) | ORAL | 0 refills | Status: AC

## 2023-03-22 NOTE — Progress Notes (Signed)
VASCULAR LAB    Left upper extremity venous duplex has been performed.  See CV proc for preliminary results.   Shevonne Wolf, RVT 03/22/2023, 9:20 AM

## 2023-03-22 NOTE — Plan of Care (Signed)

## 2023-03-22 NOTE — Consult Note (Signed)
CARDIOLOGY CONSULT NOTE       Patient ID: Wyatt Donovan MRN: [id number] DOB/AGE: 11/11/1985 38 y.o.  Admit date: 03/18/2023 Referring Physician: Isidoro Donning Primary Physician: Toma Deiters, MD Primary Cardiologist: Shirlee Latch Reason for Consultation: Chest pain  Principal Problem:   RUQ pain Active Problems:   Chest pain   S/P CABG x 4   Coronary artery disease   Hypercholesterolemia   Chronic systolic CHF (congestive heart failure) (HCC)   Ischemic cardiomyopathy   CAP (community acquired pneumonia)   Nausea and vomiting   Constipation   HPI:  38 y.o. with complex past medical history Premature CAD with CABG in 2012 Known occlusion of SVGls patent lima and patent stent to LCX with occluded native RCA by most recent cath 02/19/23 Known ischemic DCM with EF 30-35%. Has AICD with cardio modulator and followed closely by CHF clinic. Has had gallstones and admitted with RUQ pain. To my interview he really denies any angina or chest pain. He has RUQ pain. Taking PO. He has LUE pain starting in elbow and going to shoulder likely from recent iv stick.  On admission CT with RUL pneumonia GB ok and HIDA scan negative  He tells me GI plans on doing outpatient EGD   ROS All other systems reviewed and negative except as noted above  Past Medical History:  Diagnosis Date   Atrial fibrillation (HCC)    Atypical chest pain    CAD (coronary artery disease)    CHF (congestive heart failure) (HCC)    Dyslipidemia    Ex-smoker    History of depression    Hyperlipidemia    Insomnia    Ischemic cardiomyopathy    Ejection fraction 40-45%   NSTEMI (non-ST elevated myocardial infarction) (HCC) 08/01/2007   Treated with a bare metal stent to the proximal LAD, pt states MI x3   Suicide attempt (HCC) 01/30/2001    Family History  Problem Relation Age of Onset   Colon cancer Father 45   Colon cancer Paternal Grandfather    Colon cancer Paternal Aunt    Hypertension Neg Hx    Diabetes Neg Hx     Coronary artery disease Neg Hx    Esophageal cancer Neg Hx    Rectal cancer Neg Hx    Stomach cancer Neg Hx     Social History   Socioeconomic History   Marital status: Single    Spouse name: Not on file   Number of children: Not on file   Years of education: Not on file   Highest education level: High school graduate  Occupational History   Occupation: Not actively working    Comment: applied for disability x 2.   Tobacco Use   Smoking status: Former    Current packs/day: 0.00    Average packs/day: 1 pack/day for 22.0 years (22.0 ttl pk-yrs)    Types: Cigarettes, Cigars    Start date: 06/29/1998    Quit date: 06/28/2020    Years since quitting: 2.7   Smokeless tobacco: Never   Tobacco comments:    cigarettes stopped in 2020, [employer] started in 2018-04-05/day  Vaping Use   Vaping status: Never Used  Substance and Sexual Activity   Alcohol use: Never   Drug use: Not Currently    Types: Marijuana   Sexual activity: Never  Other Topics Concern   Not on file  Social History Narrative   Single   Lives with his parents   Trying to get his GED  DeGent date all cultures   Social Drivers of Health   Financial Resource Strain: Medium Risk (12/04/2020)   Overall Financial Resource Strain (CARDIA)    Difficulty of Paying Living Expenses: Somewhat hard  Food Insecurity: No Food Insecurity (03/19/2023)   Hunger Vital Sign    Worried About Running Out of Food in the Last Year: Never true    Ran Out of Food in the Last Year: Never true  Transportation Needs: No Transportation Needs (03/19/2023)   PRAPARE - Administrator, Civil Service (Medical): No    Lack of Transportation (Non-Medical): No  Physical Activity: Not on file  Stress: Not on file  Social Connections: Not on file  Intimate Partner Violence: Not At Risk (03/19/2023)   Humiliation, Afraid, Rape, and Kick questionnaire    Fear of Current or Ex-Partner: No    Emotionally Abused: No    Physically  Abused: No    Sexually Abused: No    Past Surgical History:  Procedure Laterality Date   ADENOIDECTOMY     CARDIOVERSION N/A 12/06/2020   Procedure: CARDIOVERSION;  Surgeon: Laurey Morale, MD;  Location: [facility] ENDOSCOPY;  Service: Cardiovascular;  Laterality: N/A;   CCM GENERATOR AND A/V LEAD INSERTION N/A 04/17/2022   Procedure: CCM GENERATOR AND A/V LEAD INSERTION;  Surgeon: Lanier Prude, MD;  Location: [facility] INVASIVE CV LAB;  Service: Cardiovascular;  Laterality: N/A;   COLONOSCOPY     CORONARY ARTERY BYPASS GRAFT N/A 07/03/2020   Procedure: CORONARY ARTERY BYPASS GRAFTING (CABG) times four using left internal mammary artery, right arm radial artery and right leg saphenous vein;  Surgeon: Corliss Skains, MD;  Location: [facility] OR;  Service: Open Heart Surgery;  Laterality: N/A;   CORONARY STENT INTERVENTION N/A 12/03/2020   Procedure: CORONARY STENT INTERVENTION;  Surgeon: [place], Autrey Human M, MD;  Location: [facility] INVASIVE CV LAB;  Service: Cardiovascular;  Laterality: N/A;   CORONARY STENT PLACEMENT     Bare metal stent to proximal LAD   ICD IMPLANT N/A 02/10/2021   Procedure: ICD IMPLANT;  Surgeon: Lanier Prude, MD;  Location: [facility] INVASIVE CV LAB;  Service: Cardiovascular;  Laterality: N/A;   LEFT HEART CATH AND CORONARY ANGIOGRAPHY N/A 07/01/2020   Procedure: LEFT HEART CATH AND CORONARY ANGIOGRAPHY;  Surgeon: Runell Gess, MD;  Location: [facility] INVASIVE CV LAB;  Service: Cardiovascular;  Laterality: N/A;   LEFT HEART CATH AND CORS/GRAFTS ANGIOGRAPHY N/A 12/03/2020   Procedure: LEFT HEART CATH AND CORS/GRAFTS ANGIOGRAPHY;  Surgeon: [place], Rosaline Ezekiel M, MD;  Location: [facility] INVASIVE CV LAB;  Service: Cardiovascular;  Laterality: N/A;   RADIAL ARTERY HARVEST Right 07/03/2020   Procedure: RADIAL ARTERY HARVEST;  Surgeon: Corliss Skains, MD;  Location: [facility] OR;  Service: Open Heart Surgery;  Laterality: Right;   RIGHT/LEFT HEART CATH AND CORONARY/GRAFT ANGIOGRAPHY N/A 02/19/2023    Procedure: RIGHT/LEFT HEART CATH AND CORONARY/GRAFT ANGIOGRAPHY;  Surgeon: Laurey Morale, MD;  Location: [facility] INVASIVE CV LAB;  Service: Cardiovascular;  Laterality: N/A;   TEE WITHOUT CARDIOVERSION N/A 07/03/2020   Procedure: TRANSESOPHAGEAL ECHOCARDIOGRAM (TEE);  Surgeon: Corliss Skains, MD;  Location: [facility] OR;  Service: Open Heart Surgery;  Laterality: N/A;   TYMPANOSTOMY TUBE PLACEMENT     when I was a kid   UPPER GASTROINTESTINAL ENDOSCOPY        Current Facility-Administered Medications:    bisacodyl (DULCOLAX) suppository 10 mg, 10 mg, Rectal, Daily PRN, Marolyn Haller, MD   bisacodyl (DULCOLAX) suppository 10 mg, 10 mg, Rectal,  Once, Imogene Burn, MD   calcium carbonate (TUMS - dosed in mg elemental calcium) chewable tablet 400 mg of elemental calcium, 2 tablet, Oral, Q4H PRN, Marolyn Haller, MD, 400 mg of elemental calcium at 03/21/23 1026   carvedilol (COREG) tablet 3.125 mg, 3.125 mg, Oral, BID WC, Eduard Clos, MD, 3.125 mg at 03/21/23 1701   cefTRIAXone (ROCEPHIN) 2 g in sodium chloride 0.9 % 100 mL IVPB, 2 g, Intravenous, Q24H, Eduard Clos, MD, Last Rate: 200 mL/hr at 03/22/23 0539, 2 g at 03/22/23 0539   digoxin (LANOXIN) tablet 125 mcg, 125 mcg, Oral, Daily, Eduard Clos, MD, 125 mcg at 03/21/23 0981   docusate sodium (COLACE) capsule 100 mg, 100 mg, Oral, BID, Rai, Ripudeep K, MD, 100 mg at 03/21/23 2137   doxycycline (VIBRA-TABS) tablet 100 mg, 100 mg, Oral, Q12H, Rai, Ripudeep K, MD   HYDROcodone-acetaminophen (NORCO/VICODIN) 5-325 MG per tablet 1-2 tablet, 1-2 tablet, Oral, Q4H PRN, Rai, Ripudeep K, MD   hydrocortisone (ANUSOL-HC) suppository 25 mg, 25 mg, Rectal, BID, Rai, Ripudeep K, MD   HYDROmorphone (DILAUDID) injection 0.5 mg, 0.5 mg, Intravenous, Q4H PRN, Marolyn Haller, MD, 0.5 mg at 03/22/23 0601   lactulose [facility]) enema 200 gm, 300 mL, Rectal, Once PRN, Marolyn Haller, MD   nitroGLYCERIN (NITROSTAT) SL tablet 0.4 mg,  0.4 mg, Sublingual, Q5 min PRN, Rai, Ripudeep K, MD, 0.4 mg at 03/21/23 1722   omega-3 acid ethyl esters (LOVAZA) capsule 2,000 mg, 2,000 mg, Oral, Daily, Eduard Clos, MD, 2,000 mg at 03/21/23 0836   pantoprazole (PROTONIX) EC tablet 40 mg, 40 mg, Oral, BID AC, Rai, Ripudeep K, MD   polyethylene glycol (MIRALAX / GLYCOLAX) packet 17 g, 17 g, Oral, BID, Imogene Burn, MD, 17 g at 03/21/23 2136   sacubitril-valsartan (ENTRESTO) 24-26 mg per tablet, 1 tablet, Oral, BID, Eduard Clos, MD, 1 tablet at 03/21/23 2137   sodium zirconium cyclosilicate (LOKELMA) packet 5 g, 5 g, Oral, Daily, Eduard Clos, MD, 5 g at 03/20/23 1914   spironolactone (ALDACTONE) tablet 25 mg, 25 mg, Oral, Daily, Eduard Clos, MD, 25 mg at 03/21/23 0836  bisacodyl  10 mg Rectal Once   carvedilol  3.125 mg Oral BID WC   digoxin  125 mcg Oral Daily   docusate sodium  100 mg Oral BID   doxycycline  100 mg Oral Q12H   hydrocortisone  25 mg Rectal BID   omega-3 acid ethyl esters  2,000 mg Oral Daily   pantoprazole  40 mg Oral BID AC   polyethylene glycol  17 g Oral BID   sacubitril-valsartan  1 tablet Oral BID   sodium zirconium cyclosilicate  5 g Oral Daily   spironolactone  25 mg Oral Daily    cefTRIAXone (ROCEPHIN)  IV 2 g (03/22/23 0539)    Physical Exam: Blood pressure 113/68, pulse 60, temperature (!) 97.5 F (36.4 C), temperature source Oral, resp. rate 18, height 6\' 1"  (1.854 m), weight 95.8 kg, SpO2 98%.   Chronically ill male Lungs clear PMI increased no murmur  AICD under left clavicle  Abdomen mild epigastric RUQ pain No edema Neuro non focal  Labs:   Lab Results  Component Value Date   WBC 6.7 03/22/2023   HGB 14.5 03/22/2023   HCT 44.6 03/22/2023   MCV 92.7 03/22/2023   PLT 176 03/22/2023    Recent Labs  Lab 03/19/23 0536 03/20/23 0343 03/22/23 0401  NA 138   < > 138  K 4.2   < > 4.0  CL 103   < > 106  CO2 24   < > 22  BUN 17   < > 7  CREATININE 1.09    < > 0.79  CALCIUM 9.0   < > 9.1  PROT 6.2*  --   --   BILITOT 0.8  --   --   ALKPHOS 75  --   --   ALT 28  --   --   AST 25  --   --   GLUCOSE 89   < > 92   < > = values in this interval not displayed.   Lab Results  Component Value Date   CKTOTAL 281 12/05/2020   CKMB 35.6 (H) 08/31/2007   TROPONINI (HH) 08/31/2007    6.04        POSSIBLE MYOCARDIAL ISCHEMIA. SERIAL TESTING RECOMMENDED. CRITICAL RESULT CALLED TO, READ BACK BY AND VERIFIED WITH: OLCZAK,J RN [id number] [phone] [place]    Lab Results  Component Value Date   CHOL 106 08/26/2021   CHOL 94 12/04/2020   CHOL 74 08/08/2020   Lab Results  Component Value Date   HDL 31 (L) 08/26/2021   HDL 25 (L) 12/04/2020   HDL 19 (L) 08/08/2020   Lab Results  Component Value Date   LDLCALC 60 08/26/2021   LDLCALC 45 12/04/2020   LDLCALC 39 08/08/2020   Lab Results  Component Value Date   TRIG 76 08/26/2021   TRIG 118 12/04/2020   TRIG 78 08/08/2020   Lab Results  Component Value Date   CHOLHDL 3.4 08/26/2021   CHOLHDL 3.8 12/04/2020   CHOLHDL 3.9 08/08/2020   No results found for: "LDLDIRECT"    Radiology: NM Hepato W/EF Result Date: 03/19/2023 CLINICAL DATA:  Biliary colic EXAM: NUCLEAR MEDICINE HEPATOBILIARY IMAGING WITH GALLBLADDER EF TECHNIQUE: Sequential images of the abdomen were obtained out to 60 minutes following intravenous administration of radiopharmaceutical. After oral ingestion of Ensure, gallbladder ejection fraction was determined. At 60 min, normal ejection fraction is greater than 33%. RADIOPHARMACEUTICALS:  5.4 mCi Tc-49m  Choletec IV COMPARISON:  None Available. FINDINGS: Prompt uptake and biliary excretion of activity by the liver is seen. Gallbladder activity is visualized, consistent with patency of cystic duct. Biliary activity passes into small bowel, consistent with patent common bile duct. Calculated gallbladder ejection fraction is 79%. (Normal gallbladder ejection fraction with Ensure is greater  than 33% and less than 80%.) IMPRESSION: Gallbladder ejection fraction 79%. No common duct or cystic duct obstruction. Electronically Signed   By: Karen Kays M.D.   On: 03/19/2023 13:40   CT Angio Chest PE W and/or Wo Contrast Result Date: 03/19/2023 CLINICAL DATA:  Shortness of breath, vomiting, right upper quadrant pain. EXAM: CT ANGIOGRAPHY CHEST CT ABDOMEN AND PELVIS WITH CONTRAST TECHNIQUE: Multidetector CT imaging of the chest was performed using the standard protocol during bolus administration of intravenous contrast. Multiplanar CT image reconstructions and MIPs were obtained to evaluate the vascular anatomy. Multidetector CT imaging of the abdomen and pelvis was performed using the standard protocol during bolus administration of intravenous contrast. RADIATION DOSE REDUCTION: This exam was performed according to the departmental dose-optimization program which includes automated exposure control, adjustment of the mA and/or kV according to patient size and/or use of iterative reconstruction technique. CONTRAST:  75mL OMNIPAQUE IOHEXOL 350 MG/ML SOLN COMPARISON:  Right upper quadrant ultrasound dated 03/18/2023. CT abdomen/pelvis dated 02/14/2023. FINDINGS: CTA CHEST FINDINGS Cardiovascular: Satisfactory opacification of the bilateral pulmonary arteries  to the segmental level. No evidence of pulmonary embolism. Although not tailored for evaluation of the thoracic aorta, there is no evidence of thoracic aortic aneurysm or dissection. Heart is normal in size.  No pericardial effusion. Mild three-vessel coronary atherosclerosis. Postsurgical changes related to prior CABG. Mediastinum/Nodes: No suspicious mediastinal lymphadenopathy. Visualized thyroid is unremarkable. Lungs/Pleura: Mild patchy/nodular opacities in the posterior right upper lobe (series 4/image 48), suggesting mild infection/pneumonia. No suspicious pulmonary nodules. No pleural effusion or pneumothorax. Musculoskeletal: Visualized  osseous structures are within normal limits. Median sternotomy. Review of the MIP images confirms the above findings. CT ABDOMEN and PELVIS FINDINGS Hepatobiliary: Liver is within normal limits. Gallbladder is unremarkable. No intrahepatic or extrahepatic ductal dilatation. Pancreas: Within normal limits. Spleen: Within normal limits Adrenals/Urinary Tract: Adrenal glands are within normal limits. Kidneys are within normal limits.  No hydronephrosis. Bladder is within normal limits. Stomach/Bowel: Stomach is notable for a tiny hiatal hernia. No evidence of bowel obstruction. Normal appendix (series 6/image 31). No colonic wall thickening or inflammatory changes. Vascular/Lymphatic: No evidence of abdominal aortic aneurysm. No suspicious abdominopelvic lymphadenopathy. Reproductive: Prostate is unremarkable. Other: No abdominopelvic ascites. Small left and trace right fat containing inguinal hernias. Musculoskeletal: Visualized osseous structures are within normal limits. Review of the MIP images confirms the above findings. IMPRESSION: No evidence of pulmonary embolism. Mild right upper lobe pneumonia. Gallbladder is within normal limits on CT. Electronically Signed   By: Charline Bills M.D.   On: 03/19/2023 02:04   CT ABDOMEN PELVIS W CONTRAST Result Date: 03/19/2023 CLINICAL DATA:  Shortness of breath, vomiting, right upper quadrant pain. EXAM: CT ANGIOGRAPHY CHEST CT ABDOMEN AND PELVIS WITH CONTRAST TECHNIQUE: Multidetector CT imaging of the chest was performed using the standard protocol during bolus administration of intravenous contrast. Multiplanar CT image reconstructions and MIPs were obtained to evaluate the vascular anatomy. Multidetector CT imaging of the abdomen and pelvis was performed using the standard protocol during bolus administration of intravenous contrast. RADIATION DOSE REDUCTION: This exam was performed according to the departmental dose-optimization program which includes automated  exposure control, adjustment of the mA and/or kV according to patient size and/or use of iterative reconstruction technique. CONTRAST:  75mL OMNIPAQUE IOHEXOL 350 MG/ML SOLN COMPARISON:  Right upper quadrant ultrasound dated 03/18/2023. CT abdomen/pelvis dated 02/14/2023. FINDINGS: CTA CHEST FINDINGS Cardiovascular: Satisfactory opacification of the bilateral pulmonary arteries to the segmental level. No evidence of pulmonary embolism. Although not tailored for evaluation of the thoracic aorta, there is no evidence of thoracic aortic aneurysm or dissection. Heart is normal in size.  No pericardial effusion. Mild three-vessel coronary atherosclerosis. Postsurgical changes related to prior CABG. Mediastinum/Nodes: No suspicious mediastinal lymphadenopathy. Visualized thyroid is unremarkable. Lungs/Pleura: Mild patchy/nodular opacities in the posterior right upper lobe (series 4/image 48), suggesting mild infection/pneumonia. No suspicious pulmonary nodules. No pleural effusion or pneumothorax. Musculoskeletal: Visualized osseous structures are within normal limits. Median sternotomy. Review of the MIP images confirms the above findings. CT ABDOMEN and PELVIS FINDINGS Hepatobiliary: Liver is within normal limits. Gallbladder is unremarkable. No intrahepatic or extrahepatic ductal dilatation. Pancreas: Within normal limits. Spleen: Within normal limits Adrenals/Urinary Tract: Adrenal glands are within normal limits. Kidneys are within normal limits.  No hydronephrosis. Bladder is within normal limits. Stomach/Bowel: Stomach is notable for a tiny hiatal hernia. No evidence of bowel obstruction. Normal appendix (series 6/image 31). No colonic wall thickening or inflammatory changes. Vascular/Lymphatic: No evidence of abdominal aortic aneurysm. No suspicious abdominopelvic lymphadenopathy. Reproductive: Prostate is unremarkable. Other: No abdominopelvic ascites. Small left and  trace right fat containing inguinal hernias.  Musculoskeletal: Visualized osseous structures are within normal limits. Review of the MIP images confirms the above findings. IMPRESSION: No evidence of pulmonary embolism. Mild right upper lobe pneumonia. Gallbladder is within normal limits on CT. Electronically Signed   By: Charline Bills M.D.   On: 03/19/2023 02:04   US ABDOMEN LIMITED RUQ (LIVER/GB) Result Date: 03/18/2023 CLINICAL DATA:  Right upper quadrant abdominal pain. EXAM: ULTRASOUND ABDOMEN LIMITED RIGHT UPPER QUADRANT COMPARISON:  Ultrasound dated 02/18/2023. FINDINGS: Gallbladder: No gallstones or wall thickening visualized. No sonographic Murphy sign noted by sonographer. Common bile duct: Diameter: 5 mm Liver: The liver is unremarkable. Portal vein is patent on color Doppler imaging with normal direction of blood flow towards the liver. Other: None. IMPRESSION: Unremarkable right upper quadrant ultrasound. Electronically Signed   By: Elgie Collard M.D.   On: 03/18/2023 17:35    EKG: SR first degree old IMI poor R wave prgoression   ASSESSMENT AND PLAN:   Chest pain: denies to me just had a cath 3 weeks ago with stable anatomy LIMA patent stent LCX patent and CTO RCA. Continue home meds no indication for further w/u Abdominal Pain. Not from right heart failure. GB/HIDA ok on [facility] scans R/O negative troponins Plan for outpatient EGD CHF:  has cardio modulator continue GDMT outpatient f/u with Dr Shirlee Latch  PAF/Mural thrombus:  resume DOAC today  Pneumonia:  change to PO antibiotics per primary service   Ok to d/c home from cardiology standpoint  Will sign off  Signed: Charlton Haws 03/22/2023, 8:54 AM

## 2023-03-22 NOTE — Discharge Summary (Signed)
Physician Discharge Summary   Patient: Wyatt Donovan MRN: [id number] DOB: 11-05-1985  Admit date:     03/18/2023  Discharge date: 03/22/23  Discharge Physician: Thad Ranger, MD    PCP: Toma Deiters, MD   Recommendations at discharge:   Resume eliquis Plan for outpatient GI workup with Dr. Leonides Schanz  Discharge Diagnoses:    RUQ/epigastric pain Left cephalic vein superficial vein thrombosis CAD  S/P CABG x 4   Hypercholesterolemia   Chronic systolic CHF (congestive heart failure) (HCC)   Ischemic cardiomyopathy   CAP (community acquired pneumonia)   Constipation   Hospital Course:  Patient is a 38 year old male with early onset CAD status post CABG, had initial MI in 2012 at the age of 71 with PCI to LAD, inferior lateral MI in 05/2020, LHC showed three-vessel disease, CABG x 4, postop GI bleeding after CABG, prior history of LV thrombus, chronic systolic CHF, prior smoker, paroxysmal atrial fibrillation presented with worsening right upper quadrant and epigastric abdominal pain with nausea and vomiting.  CT angiogram of the chest showed no PE but showed mild right upper lobe pneumonia.  CT abdomen pelvis with no gallbladder pathology or liver pathology.  HIDA scan showed normal EF with no evidence of cholecystitis.   Of note, patient had presented to ED in 01/2023 with RUQ abdominal pain, CT abdomen had shown stone in the cystic duct with mildly distended gallbladder with no evidence of cholecystitis)) Also reported some substernal chest pressure in ED, received sublingual nitro x 1 which alleviated his pain.  Assessment and Plan:  RUQ, epigastric abdominal pain ?melena  -Unclear etiology, underwent RUQ [place], CT abdomen pelvis with no gallbladder pathology or liver pathology.  HIDA scan showed normal EF with no evidence of cholecystitis.   -General Surgery was consulted, seen by Dr. Violeta Gelinas, had recommended HIDA scan. -HIDA scan showed normal EF with no evidence of  cholecystitis or biliary dyskinesia.  Per surgery, no need for lap Coley based on findings. -GI was consulted, recommended continue PPI and outpatient further workup.  Marland Kitchen His last EGD and colonoscopy in 2023 showed some mild gastritis, colon polyps, hemorrhoids, and prep related mild inflammation in the entire colon that was negative for inflammatory bowel disease.  -Recommended Anusol suppository, MiraLAX.  Patient is on Linzess outpatient as well.    Right upper lobe pneumonia/community-acquired pneumonia -Mild leukocytosis on admission 10.6, procalcitonin less than 0.1 -Continue doxycycline, Vantin for 3 more days to complete full course   Early onset CAD s/p CABG 4v (2022) s/p PCI w/ stent to LAD in 2012, s/p PCI DES to Lcx 2022.  Prior history of LV thrombus. -Follows with Dr. Shirlee Latch.  -2D echo 01/2023 had shown EF of 30 to 35%, global hypokinesis, mild dysfunction.   -Underwent L/RHC 01/2023 occluded grafts except LIMA-LAD. Occluded native RCA. Patent m Lcx stent.  -Continue digoxin, spironolactone, lokelma, Entresto -Continue atorvastatin - No thrombus on 12/05/20 cardiac MRI. - Plavix stopped 09/2021 due to GI bleed postop.   -Seen by cardiology, cleared to discharge home, resumed DOAC/apixaban       Paroxysmal atrial fibrillation -Had undergone DCCV 11/2020, CHA2DS2VASc of at least 4.  -Resumed apixaban  - continue Coreg, digoxin -Off amiodarone     Constipation, internal hemorrhoids -Placed on daily MiraLAX and Linzess, Anusol suppositories  Left UE superficial thrombosis -Noted left arm swelling right above the IV line with redness.  Doppler ultrasound showed superficial ecchymosis of cephalic vein. -Placed K-pad and patient is already resumed on apixaban  Estimated body mass index is 27.85 kg/m as calculated from the following:   Height as of this encounter: 6\' 1"  (1.854 m).   Weight as of this encounter: 95.8 kg.      Pain control - [employer] Controlled  Substance Reporting System database was reviewed. and patient was instructed, not to drive, operate heavy machinery, perform activities at heights, swimming or participation in water activities or provide baby-sitting services while on Pain, Sleep and Anxiety Medications; until their outpatient Physician has advised to do so again. Also recommended to not to take more than prescribed Pain, Sleep and Anxiety Medications.  Consultants: GI, cardiology Procedures performed:   Disposition: Home Diet recommendation:  Discharge Diet Orders (From admission, onward)     Start     Ordered   03/22/23 0000  Diet - low sodium heart healthy        03/22/23 1000            DISCHARGE MEDICATION: Allergies as of 03/22/2023       Reactions   Codeine Itching        Medication List     TAKE these medications    apixaban 5 MG Tabs tablet Commonly known as: ELIQUIS Take 1 tablet (5 mg total) by mouth 2 (two) times daily.   atorvastatin 80 MG tablet Commonly known as: LIPITOR Take 1 tablet (80 mg total) by mouth daily.   carvedilol 3.125 MG tablet Commonly known as: COREG TAKE ONE TABLET BY MOUTH TWICE DAILY WITH MEALS   cefpodoxime 200 MG tablet Commonly known as: VANTIN Take 1 tablet (200 mg total) by mouth 2 (two) times daily for 3 days. Start taking on: March 23, 2023   dapagliflozin propanediol 10 MG Tabs tablet Commonly known as: Farxiga Take 1 tablet (10 mg total) by mouth daily before breakfast.   digoxin 0.125 MG tablet Commonly known as: Lanoxin Take 1 tablet (125 mcg total) by mouth daily.   doxycycline 100 MG tablet Commonly known as: VIBRA-TABS Take 1 tablet (100 mg total) by mouth 2 (two) times daily for 3 days.   Entresto 24-26 MG Generic drug: sacubitril-valsartan Take 1 tablet by mouth 2 (two) times daily.   Fish Oil 500 MG Caps Take 2,000 mg by mouth daily.   furosemide 20 MG tablet Commonly known as: LASIX Take 20 mg by mouth daily.   hydrocortisone  25 MG suppository Commonly known as: ANUSOL-HC Place 1 suppository (25 mg total) rectally 2 (two) times daily.   linaclotide 290 MCG Caps capsule Commonly known as: Linzess Take 1 capsule (290 mcg total) by mouth daily before breakfast. What changed:  when to take this reasons to take this   Lokelma 5 g packet Generic drug: sodium zirconium cyclosilicate Take 5 g by mouth daily.   nitroGLYCERIN 0.4 MG SL tablet Commonly known as: NITROSTAT Place 1 tablet (0.4 mg total) under the tongue every 5 (five) minutes x 3 doses as needed for chest pain.   ondansetron 4 MG disintegrating tablet Commonly known as: ZOFRAN-ODT Take 1 tablet (4 mg total) by mouth every 8 (eight) hours as needed for nausea or vomiting.   oxyCODONE-acetaminophen 5-325 MG tablet Commonly known as: PERCOCET/ROXICET Take 1 tablet by mouth every 4 (four) hours as needed for severe pain (pain score 7-10).   pantoprazole 40 MG tablet Commonly known as: PROTONIX Take 1 tablet (40 mg total) by mouth 2 (two) times daily before a meal. Continue Protonix 40 mg twice a day for 8 weeks, then  continue daily What changed:  when to take this additional instructions   polyethylene glycol 17 g packet Commonly known as: MiraLax Take 17 g by mouth daily.   spironolactone 25 MG tablet Commonly known as: ALDACTONE Take 1 tablet (25 mg total) by mouth daily.        Follow-up Information     Laurey Morale, MD Follow up on 04/27/2023.   Specialty: Cardiology Why: At 9:20 AM, for hospital follow-up Contact information: [address] [place] [address] [phone]         Imogene Burn, MD. Schedule an appointment as soon as possible for a visit in 2 week(s).   Specialty: Gastroenterology Why: for hospital follow-up Contact information: [address] [place] [address] [phone]         Toma Deiters, MD. Schedule an appointment as soon as possible for a visit in 2  week(s).   Specialty: Internal Medicine Why: for hospital follow-up Contact information: [address] [place] [address] [phone]                Discharge Exam: Filed Weights   03/19/23 1550  Weight: 95.8 kg   S: Redness and swelling on the left arm right above the IV line.  No nausea vomiting, chest pain or shortness of breath.  Cleared by cardiology to discharge home.  BP 105/60 (BP Location: Right Arm)   Pulse 60   Temp (!) 97.5 F (36.4 C) (Oral)   Resp 18   Ht 6\' 1"  (1.854 m)   Wt 95.8 kg   SpO2 98%   BMI 27.85 kg/m   Physical Exam General: Alert and oriented x 3, NAD Cardiovascular: S1 S2 clear, RRR.  Respiratory: CTAB, no wheezing Gastrointestinal: Soft, nontender, nondistended, NBS Ext: no pedal edema bilaterally Neuro: no new deficits Skin: Mild redness and streaking on the left arm above the IV line Psych: Normal affect    Condition at discharge: fair  The results of significant diagnostics from this hospitalization (including imaging, microbiology, ancillary and laboratory) are listed below for reference.   Imaging Studies: VAS [place] UPPER EXTREMITY VENOUS DUPLEX Result Date: 03/22/2023 UPPER VENOUS STUDY  Patient Name:  KEMONTAE NAZZAL  Date of Exam:   03/22/2023 Medical Rec #: [id number]         Accession #:    [id number] Date of Birth: 1985/03/07         Patient Gender: M Patient Age:   18 years Exam Location:  [facility] Procedure:      VAS [place] UPPER EXTREMITY VENOUS DUPLEX Referring Phys: Daymond Cordts --------------------------------------------------------------------------------  Indications: Erythema, Palpable Cord, and Pain Comparison Study: No prior study on file Performing Technologist: Sherren Kerns RVS  Examination Guidelines: A complete evaluation includes B-mode imaging, spectral Doppler, color Doppler, and power Doppler as needed of all accessible portions of each vessel. Bilateral testing is considered an integral part of a  complete examination. Limited examinations for reoccurring indications may be performed as noted.  Right Findings: +----------+------------+---------+-----------+----------+-------+ RIGHT     CompressiblePhasicitySpontaneousPropertiesSummary +----------+------------+---------+-----------+----------+-------+ Subclavian               Yes       Yes                      +----------+------------+---------+-----------+----------+-------+  Left Findings: +----------+------------+---------+-----------+----------+---------------------+ LEFT      CompressiblePhasicitySpontaneousProperties       Summary        +----------+------------+---------+-----------+----------+---------------------+  IJV           Full       Yes       Yes                                    +----------+------------+---------+-----------+----------+---------------------+ Subclavian               Yes       Yes                                    +----------+------------+---------+-----------+----------+---------------------+ Axillary                 Yes       Yes                                    +----------+------------+---------+-----------+----------+---------------------+ Brachial      Full                                                        +----------+------------+---------+-----------+----------+---------------------+ Radial        Full                                                        +----------+------------+---------+-----------+----------+---------------------+ Ulnar         Full                                                        +----------+------------+---------+-----------+----------+---------------------+ Cephalic      None                                    Acute from [facility] to                                                         proximal upper arm   +----------+------------+---------+-----------+----------+---------------------+ Basilic       Full                                                         +----------+------------+---------+-----------+----------+---------------------+  Summary:  Right: No evidence of thrombosis in the subclavian.  Left: No evidence of deep vein thrombosis in the upper extremity. Findings consistent with acute superficial vein thrombosis involving the left cephalic vein.  *See table(s) above for measurements and observations.    Preliminary    NM  Hepato W/EF Result Date: 03/19/2023 CLINICAL DATA:  Biliary colic EXAM: NUCLEAR MEDICINE HEPATOBILIARY IMAGING WITH GALLBLADDER EF TECHNIQUE: Sequential images of the abdomen were obtained out to 60 minutes following intravenous administration of radiopharmaceutical. After oral ingestion of Ensure, gallbladder ejection fraction was determined. At 60 min, normal ejection fraction is greater than 33%. RADIOPHARMACEUTICALS:  5.4 mCi Tc-45m  Choletec IV COMPARISON:  None Available. FINDINGS: Prompt uptake and biliary excretion of activity by the liver is seen. Gallbladder activity is visualized, consistent with patency of cystic duct. Biliary activity passes into small bowel, consistent with patent common bile duct. Calculated gallbladder ejection fraction is 79%. (Normal gallbladder ejection fraction with Ensure is greater than 33% and less than 80%.) IMPRESSION: Gallbladder ejection fraction 79%. No common duct or cystic duct obstruction. Electronically Signed   By: Karen Kays M.D.   On: 03/19/2023 13:40   CT Angio Chest PE W and/or Wo Contrast Result Date: 03/19/2023 CLINICAL DATA:  Shortness of breath, vomiting, right upper quadrant pain. EXAM: CT ANGIOGRAPHY CHEST CT ABDOMEN AND PELVIS WITH CONTRAST TECHNIQUE: Multidetector CT imaging of the chest was performed using the standard protocol during bolus administration of intravenous contrast. Multiplanar CT image reconstructions and MIPs were obtained to evaluate the vascular anatomy. Multidetector CT imaging of the abdomen and  pelvis was performed using the standard protocol during bolus administration of intravenous contrast. RADIATION DOSE REDUCTION: This exam was performed according to the departmental dose-optimization program which includes automated exposure control, adjustment of the mA and/or kV according to patient size and/or use of iterative reconstruction technique. CONTRAST:  75mL OMNIPAQUE IOHEXOL 350 MG/ML SOLN COMPARISON:  Right upper quadrant ultrasound dated 03/18/2023. CT abdomen/pelvis dated 02/14/2023. FINDINGS: CTA CHEST FINDINGS Cardiovascular: Satisfactory opacification of the bilateral pulmonary arteries to the segmental level. No evidence of pulmonary embolism. Although not tailored for evaluation of the thoracic aorta, there is no evidence of thoracic aortic aneurysm or dissection. Heart is normal in size.  No pericardial effusion. Mild three-vessel coronary atherosclerosis. Postsurgical changes related to prior CABG. Mediastinum/Nodes: No suspicious mediastinal lymphadenopathy. Visualized thyroid is unremarkable. Lungs/Pleura: Mild patchy/nodular opacities in the posterior right upper lobe (series 4/image 48), suggesting mild infection/pneumonia. No suspicious pulmonary nodules. No pleural effusion or pneumothorax. Musculoskeletal: Visualized osseous structures are within normal limits. Median sternotomy. Review of the MIP images confirms the above findings. CT ABDOMEN and PELVIS FINDINGS Hepatobiliary: Liver is within normal limits. Gallbladder is unremarkable. No intrahepatic or extrahepatic ductal dilatation. Pancreas: Within normal limits. Spleen: Within normal limits Adrenals/Urinary Tract: Adrenal glands are within normal limits. Kidneys are within normal limits.  No hydronephrosis. Bladder is within normal limits. Stomach/Bowel: Stomach is notable for a tiny hiatal hernia. No evidence of bowel obstruction. Normal appendix (series 6/image 31). No colonic wall thickening or inflammatory changes.  Vascular/Lymphatic: No evidence of abdominal aortic aneurysm. No suspicious abdominopelvic lymphadenopathy. Reproductive: Prostate is unremarkable. Other: No abdominopelvic ascites. Small left and trace right fat containing inguinal hernias. Musculoskeletal: Visualized osseous structures are within normal limits. Review of the MIP images confirms the above findings. IMPRESSION: No evidence of pulmonary embolism. Mild right upper lobe pneumonia. Gallbladder is within normal limits on CT. Electronically Signed   By: Charline Bills M.D.   On: 03/19/2023 02:04   CT ABDOMEN PELVIS W CONTRAST Result Date: 03/19/2023 CLINICAL DATA:  Shortness of breath, vomiting, right upper quadrant pain. EXAM: CT ANGIOGRAPHY CHEST CT ABDOMEN AND PELVIS WITH CONTRAST TECHNIQUE: Multidetector CT imaging of the chest was performed using the standard protocol during bolus  administration of intravenous contrast. Multiplanar CT image reconstructions and MIPs were obtained to evaluate the vascular anatomy. Multidetector CT imaging of the abdomen and pelvis was performed using the standard protocol during bolus administration of intravenous contrast. RADIATION DOSE REDUCTION: This exam was performed according to the departmental dose-optimization program which includes automated exposure control, adjustment of the mA and/or kV according to patient size and/or use of iterative reconstruction technique. CONTRAST:  75mL OMNIPAQUE IOHEXOL 350 MG/ML SOLN COMPARISON:  Right upper quadrant ultrasound dated 03/18/2023. CT abdomen/pelvis dated 02/14/2023. FINDINGS: CTA CHEST FINDINGS Cardiovascular: Satisfactory opacification of the bilateral pulmonary arteries to the segmental level. No evidence of pulmonary embolism. Although not tailored for evaluation of the thoracic aorta, there is no evidence of thoracic aortic aneurysm or dissection. Heart is normal in size.  No pericardial effusion. Mild three-vessel coronary atherosclerosis. Postsurgical  changes related to prior CABG. Mediastinum/Nodes: No suspicious mediastinal lymphadenopathy. Visualized thyroid is unremarkable. Lungs/Pleura: Mild patchy/nodular opacities in the posterior right upper lobe (series 4/image 48), suggesting mild infection/pneumonia. No suspicious pulmonary nodules. No pleural effusion or pneumothorax. Musculoskeletal: Visualized osseous structures are within normal limits. Median sternotomy. Review of the MIP images confirms the above findings. CT ABDOMEN and PELVIS FINDINGS Hepatobiliary: Liver is within normal limits. Gallbladder is unremarkable. No intrahepatic or extrahepatic ductal dilatation. Pancreas: Within normal limits. Spleen: Within normal limits Adrenals/Urinary Tract: Adrenal glands are within normal limits. Kidneys are within normal limits.  No hydronephrosis. Bladder is within normal limits. Stomach/Bowel: Stomach is notable for a tiny hiatal hernia. No evidence of bowel obstruction. Normal appendix (series 6/image 31). No colonic wall thickening or inflammatory changes. Vascular/Lymphatic: No evidence of abdominal aortic aneurysm. No suspicious abdominopelvic lymphadenopathy. Reproductive: Prostate is unremarkable. Other: No abdominopelvic ascites. Small left and trace right fat containing inguinal hernias. Musculoskeletal: Visualized osseous structures are within normal limits. Review of the MIP images confirms the above findings. IMPRESSION: No evidence of pulmonary embolism. Mild right upper lobe pneumonia. Gallbladder is within normal limits on CT. Electronically Signed   By: Charline Bills M.D.   On: 03/19/2023 02:04   US ABDOMEN LIMITED RUQ (LIVER/GB) Result Date: 03/18/2023 CLINICAL DATA:  Right upper quadrant abdominal pain. EXAM: ULTRASOUND ABDOMEN LIMITED RIGHT UPPER QUADRANT COMPARISON:  Ultrasound dated 02/18/2023. FINDINGS: Gallbladder: No gallstones or wall thickening visualized. No sonographic Murphy sign noted by sonographer. Common bile duct:  Diameter: 5 mm Liver: The liver is unremarkable. Portal vein is patent on color Doppler imaging with normal direction of blood flow towards the liver. Other: None. IMPRESSION: Unremarkable right upper quadrant ultrasound. Electronically Signed   By: Elgie Collard M.D.   On: 03/18/2023 17:35    Microbiology: Results for orders placed or performed during the hospital encounter of 12/03/20  Resp Panel by RT-PCR (Flu A&B, Covid) Nasopharyngeal Swab     Status: None   Collection Time: 12/03/20  2:16 PM   Specimen: Nasopharyngeal Swab; Nasopharyngeal(NP) swabs in vial transport medium  Result Value Ref Range Status   SARS Coronavirus 2 by RT PCR NEGATIVE NEGATIVE Final    Comment: (NOTE) SARS-CoV-2 target nucleic acids are NOT DETECTED.  The SARS-CoV-2 RNA is generally detectable in upper respiratory specimens during the acute phase of infection. The lowest concentration of SARS-CoV-2 viral copies this assay can detect is 138 copies/mL. A negative result does not preclude SARS-Cov-2 infection and should not be used as the sole basis for treatment or other patient management decisions. A negative result may occur with  improper specimen collection/handling, submission of specimen  other than nasopharyngeal swab, presence of viral mutation(s) within the areas targeted by this assay, and inadequate number of viral copies(<138 copies/mL). A negative result must be combined with clinical observations, patient history, and epidemiological information. The expected result is Negative.  Fact Sheet for Patients:  BloggerCourse.com  Fact Sheet for Healthcare Providers:  SeriousBroker.it  This test is no t yet approved or cleared by the [place] FDA and  has been authorized for detection and/or diagnosis of SARS-CoV-2 by FDA under an Emergency Use Authorization (EUA). This EUA will remain  in effect (meaning this test can be used) for the  duration of the COVID-19 declaration under Section 564(b)(1) of the Act, 21 U.S.C.section 360bbb-3(b)(1), unless the authorization is terminated  or revoked sooner.       Influenza A by PCR NEGATIVE NEGATIVE Final   Influenza B by PCR NEGATIVE NEGATIVE Final    Comment: (NOTE) The Xpert Xpress SARS-CoV-2/FLU/RSV plus assay is intended as an aid in the diagnosis of influenza from Nasopharyngeal swab specimens and should not be used as a sole basis for treatment. Nasal washings and aspirates are unacceptable for Xpert Xpress SARS-CoV-2/FLU/RSV testing.  Fact Sheet for Patients: BloggerCourse.com  Fact Sheet for Healthcare Providers: SeriousBroker.it  This test is not yet approved or cleared by the [place] FDA and has been authorized for detection and/or diagnosis of SARS-CoV-2 by FDA under an Emergency Use Authorization (EUA). This EUA will remain in effect (meaning this test can be used) for the duration of the COVID-19 declaration under Section 564(b)(1) of the Act, 21 U.S.C. section 360bbb-3(b)(1), unless the authorization is terminated or revoked.  Performed at [facility], [address] [place], [place] [address]     Labs: CBC: Recent Labs  Lab 03/18/23 1620 03/19/23 0536 03/20/23 0343 03/21/23 1542 03/22/23 0401  WBC 10.6* 8.8 5.7 8.0 6.7  NEUTROABS 7.0 5.0 2.8  --   --   HGB 16.1 14.5 14.5 14.7 14.5  HCT 49.9 45.7 44.3 45.2 44.6  MCV 93.6 93.8 91.5 91.9 92.7  PLT 253 194 199 194 176   Basic Metabolic Panel: Recent Labs  Lab 03/18/23 1620 03/19/23 0536 03/20/23 0343 03/21/23 1201 03/22/23 0401  NA 137 138 136 137 138  K 5.1 4.2 5.1 5.0 4.0  CL 107 103 106 103 106  CO2 19* 24 23 26 22   GLUCOSE 86 89 83 100* 92  BUN 22* 17 14 8 7   CREATININE 1.16 1.09 0.88 0.86 0.79  CALCIUM 9.5 9.0 8.8* 9.3 9.1  MG  --  2.2 2.3  --   --    Liver Function Tests: Recent Labs  Lab 03/18/23 1620  03/19/23 0536  AST 30 25  ALT 36 28  ALKPHOS 80 75  BILITOT 0.7 0.8  PROT 6.8 6.2*  ALBUMIN 3.5 3.3*   CBG: No results for input(s): "GLUCAP" in the last 168 hours.  Discharge time spent: greater than 30 minutes.  Signed: Thad Ranger, MD [facility] 03/22/2023

## 2023-03-23 ENCOUNTER — Telehealth: Payer: Self-pay | Admitting: Internal Medicine

## 2023-03-23 ENCOUNTER — Other Ambulatory Visit: Payer: Self-pay

## 2023-03-23 DIAGNOSIS — R1011 Right upper quadrant pain: Secondary | ICD-10-CM

## 2023-03-23 DIAGNOSIS — R1013 Epigastric pain: Secondary | ICD-10-CM

## 2023-03-23 NOTE — Telephone Encounter (Signed)
PT is calling to reschedule EGD at [facility]. Please advise.

## 2023-03-24 NOTE — Telephone Encounter (Signed)
  Wyatt Donovan 02/02/1986 [id number]    Dear Dr. Shirlee Latch   We have scheduled the above named patient for a(n) Endoscopy procedure. Our records show that (s)he is on anticoagulation therapy.  Please advise as to whether the patient may come off their therapy of Eliquis 2 days prior to their procedure which is scheduled for 06/24/23.  Please route your response to [facility] or fax response to [phone].  Sincerely,    [facility]

## 2023-03-24 NOTE — Addendum Note (Signed)
Addended by: Dennison Mascot on: 03/24/2023 03:16 PM   Modules accepted: Orders

## 2023-03-25 NOTE — Telephone Encounter (Signed)
Received clearance from cardiology via epic advise patient okay to hold Eliquis 2 days prior to procedure. Patient verbalized understanding.

## 2023-03-31 ENCOUNTER — Ambulatory Visit (HOSPITAL_COMMUNITY): Admit: 2023-03-31 | Payer: Medicaid Other | Admitting: General Surgery

## 2023-03-31 SURGERY — LAPAROSCOPIC CHOLECYSTECTOMY
Anesthesia: General

## 2023-04-05 ENCOUNTER — Encounter: Payer: Self-pay | Admitting: Adult Health

## 2023-04-26 ENCOUNTER — Telehealth (HOSPITAL_COMMUNITY): Payer: Self-pay | Admitting: Cardiology

## 2023-04-26 NOTE — Telephone Encounter (Signed)
 Front office called patient at [phone] to remind patient of his f/u appt with Dr. Shirlee Latch on Tuesday 04/27/23.  Spoke to patient and confirmed that he will be at the AHF clinic for his appt on tomorrow 04/27/23.

## 2023-04-27 ENCOUNTER — Encounter (HOSPITAL_COMMUNITY): Payer: Self-pay | Admitting: Cardiology

## 2023-04-27 ENCOUNTER — Ambulatory Visit (HOSPITAL_COMMUNITY)
Admission: RE | Admit: 2023-04-27 | Discharge: 2023-04-27 | Disposition: A | Discharge status: Home / Self Care | Payer: Medicaid Other | Source: Ambulatory Visit | Attending: Cardiology | Admitting: Cardiology

## 2023-04-27 VITALS — BP 102/60 | HR 63

## 2023-04-27 DIAGNOSIS — Z86718 Personal history of other venous thrombosis and embolism: Secondary | ICD-10-CM | POA: Insufficient documentation

## 2023-04-27 DIAGNOSIS — I25119 Atherosclerotic heart disease of native coronary artery with unspecified angina pectoris: Secondary | ICD-10-CM | POA: Diagnosis not present

## 2023-04-27 DIAGNOSIS — I255 Ischemic cardiomyopathy: Secondary | ICD-10-CM | POA: Diagnosis not present

## 2023-04-27 DIAGNOSIS — I480 Paroxysmal atrial fibrillation: Secondary | ICD-10-CM | POA: Insufficient documentation

## 2023-04-27 DIAGNOSIS — I5022 Chronic systolic (congestive) heart failure: Secondary | ICD-10-CM

## 2023-04-27 DIAGNOSIS — Z7901 Long term (current) use of anticoagulants: Secondary | ICD-10-CM | POA: Insufficient documentation

## 2023-04-27 DIAGNOSIS — R109 Unspecified abdominal pain: Secondary | ICD-10-CM | POA: Diagnosis not present

## 2023-04-27 DIAGNOSIS — R059 Cough, unspecified: Secondary | ICD-10-CM | POA: Insufficient documentation

## 2023-04-27 DIAGNOSIS — E875 Hyperkalemia: Secondary | ICD-10-CM | POA: Insufficient documentation

## 2023-04-27 DIAGNOSIS — Z951 Presence of aortocoronary bypass graft: Secondary | ICD-10-CM | POA: Diagnosis not present

## 2023-04-27 DIAGNOSIS — I4892 Unspecified atrial flutter: Secondary | ICD-10-CM | POA: Insufficient documentation

## 2023-04-27 DIAGNOSIS — Z79899 Other long term (current) drug therapy: Secondary | ICD-10-CM | POA: Diagnosis not present

## 2023-04-27 DIAGNOSIS — I252 Old myocardial infarction: Secondary | ICD-10-CM | POA: Diagnosis not present

## 2023-04-27 DIAGNOSIS — Z7984 Long term (current) use of oral hypoglycemic drugs: Secondary | ICD-10-CM | POA: Insufficient documentation

## 2023-04-27 DIAGNOSIS — Z955 Presence of coronary angioplasty implant and graft: Secondary | ICD-10-CM | POA: Diagnosis not present

## 2023-04-27 LAB — BASIC METABOLIC PANEL
Anion gap: 3 — ABNORMAL LOW (ref 5–15)
BUN: 12 mg/dL (ref 6–20)
CO2: 23 mmol/L (ref 22–32)
Calcium: 9 mg/dL (ref 8.9–10.3)
Chloride: 112 mmol/L — ABNORMAL HIGH (ref 98–111)
Creatinine, Ser: 0.76 mg/dL (ref 0.61–1.24)
GFR, Estimated: >60 mL/min (ref >60)
Glucose, Bld: 105 mg/dL — ABNORMAL HIGH (ref 70–99)
Potassium: 4.1 mmol/L (ref 3.5–5.1)
Sodium: 138 mmol/L (ref 135–145)

## 2023-04-27 LAB — LIPID PANEL
Cholesterol: 135 mg/dL (ref 0–200)
HDL: 38 mg/dL — ABNORMAL LOW (ref >40)
LDL Cholesterol: 66 mg/dL (ref 0–99)
Total CHOL/HDL Ratio: 3.6 {ratio}
Triglycerides: 155 mg/dL — ABNORMAL HIGH (ref <150)
VLDL: 31 mg/dL (ref 0–40)

## 2023-04-27 LAB — BRAIN NATRIURETIC PEPTIDE: B Natriuretic Peptide: 140.7 pg/mL — ABNORMAL HIGH (ref 0.0–100.0)

## 2023-04-27 LAB — DIGOXIN LEVEL: Digoxin Level: 1.2 ng/mL (ref 0.8–2.0)

## 2023-04-27 MED ORDER — CARVEDILOL 6.25 MG PO TABS: 6.2500 mg | ORAL_TABLET | Freq: Two times a day (BID) | ORAL | 3 refills | Status: DC

## 2023-04-27 NOTE — Progress Notes (Signed)
 [facility] Clinic Note   Primary Care: Toma Deiters, MD HF Cardiologist: Dr. Shirlee Latch EP: Dr Lalla Brothers   Chief Complaint: Heart Failure   HPI: Patient is a 38 y.o. with history of early onset CAD s/p CABG, ischemic cardiomyopathy, and LV thrombus. Patient had initial MI in 2012 at age 11, PCI to LAD.  He had inferoposterior MI in 4/22.  LHC showed 3 vessel disease, and patient had CABG x 4. Cardiac MRI in 5/22 showed LV EF 27% with LV thrombus, there was significant viability.  Post-op, patient had GI bleeding and anticoagulation was stopped.  He quit smoking after CABG.    Follow up 10/04/20 carvedilol decreased due to dizziness and repeat echo arranged.  Echo 8/22 EF 25-30%. He was referred to EP for ICD consideration => [employer] ICD.   Admitted 12/03/20 with NSTEMI. Coronary angiogram showed patent LIMA to LAD with occlusion of all other grafts. S/p PCI/DES to native mid LCX 99% stenosis.  Native LAD and RCA occluded. Angina resolved post PCI. Farxiga and Coreg added back, however spiro and losartan held due to hyperkalemia during this admission. His hospitalization was complicated by atrial fibrillation and flutter with RVR. He received IV amiodarone and started on Eliquis.  He underwent DCCV with conversion to NSR on 12/06/20. Discharged home 12/06/20, weight 177.5 lbs.  In 2/24, patient had cardiac contracility modulator placement.   Patient presented to [facility] in 12/24 with 2-3 months of RUQ pain, worse x 2-3 weeks.  Abdominal CT showed a stone in the cystic duct with mildly distended gallbladder but no evidence for cholecystitis.  He was discharged from the ER but told that he will need cholecystectomy.   Echo 01/2023 showed EF 30-35%, global hypokinesis, mild RV dysfunction, IVC not dilated.   Underwent cath 01/2023 showing normal filling pressures, low cardiac output with CI 2 by Fick,1.82 thermodilution. Coronary anatomy was unchanged.  Grafts all  occluded (known prior) except LIMA-LAD.  Occluded native RCA (known from prior).  Patent mid LCx stent. Post cath he was started on digoxin 0.125 mg.   He was admitted in 1/25 with RUL PNA and treated with antibiotics.  He continued to have abdominal pain, but this time abdominal US showed no gallstones and HIDA scan was negative.   Today he returns for HF follow up. Abdominal pain is significantly improved, he has not been using Percocet.  He thinks that taking a probiotic has helped.  No chest pain.  He still has a mild cough post-PNA. He is not short of breath with his ADLs and can walk on flat ground without problems.  He uses a cane for balance. His legs fatigue easily but no pain.   ECG (personally reviewed): NSR, 1st degree AVB, old inferior MI, poor RWP  [employer] ICD: HeartLogic score 1  Labs (10/22): K 4.5, creatinine 0.95, LDL 45 Labs (3/23): K 4.2, creatinine 0.94 Labs (5/23): K 4.5, creatinine 1.19 Labs (6/23): K 4.5, creatinine 1.03, LDL 60, TSH 0.430, HFTs nl  Labs (8/23): K 4.1, creatinine 1.08 Labs (11/23): K 4.6, creatinine 1.13 Labs (12/24): K 5.7, creatinine 1.04, hgb 15.8, HS-TnI 32 Labs (03/02/23): K 4.9 Creatinine 1 Labs (1/25): K 4, creatinine 0.79  PMH: 1. Hyperlipidemia 2. CAD: MI at age 63 in 2012, PCI to LAD.   - Inferoposterior MI in 4/22: LHC showed 3 vessel disease.  CABG with LIMA-LAD, radial to OM1, sequential SVG-PDA and D1.  - NSTEMI-->LHC (10/22): patent LIMA to LAD,  all other grafts occluded. S/p PCI DES native mid LCx 99% stenosis, LAD and RCA totally occluded.  - LHC (12/24): All SVGs occluded, LIMA-LAD patent, occluded native RCA, patent mid LCX stent.  3. Post-operative GI bleeding after CABG 4. Chronic systolic CHF: Ischemic cardiomyopathy.   - Cardiac MRI (5/22): LV EF 27%, RV EF 57%, apical thrombus, extensive viability.  - Echo (8/22) EF 20-25% - Echo (10/22): EF 35-40% - cMRI (10/22): LVEF 29%, RV EF 45%, no LV thrombus, LGE  suggestive of prior MI.  - [employer] ICD  - Cardiac contractility modulator implantation in 2/24.  - Echo (12/24): EF 30-35%, global hypokinesis, mild RV dysfunction, IVC not dilated. - RHC (12/24): mean RA 6, PA 28/13, mean PCWP 13, CI 1.82 thermo/2.0 Fick 5. LV thrombus  - No thrombus on 10/22 cMRI.  6. Prior smoker: Quit 5/22.  7. H/o hyperkalemia 8. Atrial fibrillation/flutter: Paroxysmal.  - DCCV 10/22 9. ABIs (5/22): Normal.  10. Colonic polyps 11. Symptomatic cholelithiasis  Current Outpatient Medications  Medication Sig Dispense Refill   apixaban (ELIQUIS) 5 MG TABS tablet Take 1 tablet (5 mg total) by mouth 2 (two) times daily.     atorvastatin (LIPITOR) 80 MG tablet Take 1 tablet (80 mg total) by mouth daily. 30 tablet 6   dapagliflozin propanediol (FARXIGA) 10 MG TABS tablet Take 1 tablet (10 mg total) by mouth daily before breakfast. 30 tablet 2   digoxin (LANOXIN) 0.125 MG tablet Take 1 tablet (125 mcg total) by mouth daily. 30 tablet 11   furosemide (LASIX) 20 MG tablet Take 20 mg by mouth daily.     linaclotide (LINZESS) 290 MCG CAPS capsule Take 1 capsule (290 mcg total) by mouth daily before breakfast. 30 capsule 5   nitroGLYCERIN (NITROSTAT) 0.4 MG SL tablet Place 1 tablet (0.4 mg total) under the tongue every 5 (five) minutes x 3 doses as needed for chest pain. 15 tablet 3   Omega-3 Fatty Acids (FISH OIL) 500 MG CAPS Take 2,000 mg by mouth daily.     ondansetron (ZOFRAN-ODT) 4 MG disintegrating tablet Take 1 tablet (4 mg total) by mouth every 8 (eight) hours as needed for nausea or vomiting. 20 tablet 0   pantoprazole (PROTONIX) 40 MG tablet Take 1 tablet (40 mg total) by mouth 2 (two) times daily before a meal. Continue Protonix 40 mg twice a day for 8 weeks, then continue daily 60 tablet 3   polyethylene glycol (MIRALAX) 17 g packet Take 17 g by mouth daily. 30 each 3   Probiotic Product (PROBIOTIC ADVANCED PO) Take 1 tablet by mouth daily.      sacubitril-valsartan (ENTRESTO) 24-26 MG Take 1 tablet by mouth 2 (two) times daily. 60 tablet 0   sodium zirconium cyclosilicate (LOKELMA) 5 g packet Take 5 g by mouth daily. 30 packet 6   spironolactone (ALDACTONE) 25 MG tablet Take 1 tablet (25 mg total) by mouth daily.     carvedilol (COREG) 6.25 MG tablet Take 1 tablet (6.25 mg total) by mouth 2 (two) times daily with a meal. 90 tablet 3   No current facility-administered medications for this encounter.   Allergies  Allergen Reactions   Codeine Itching   Social History   Socioeconomic History   Marital status: Single    Spouse name: Not on file   Number of children: Not on file   Years of education: Not on file   Highest education level: High school graduate  Occupational History   Occupation: Not  actively working    Comment: applied for disability x 2.   Tobacco Use   Smoking status: Former    Current packs/day: 0.00    Average packs/day: 1 pack/day for 22.0 years (22.0 ttl pk-yrs)    Types: Cigarettes, Cigars    Start date: 06/29/1998    Quit date: 06/28/2020    Years since quitting: 2.8   Smokeless tobacco: Never   Tobacco comments:    cigarettes stopped in 2020, [employer] started in 2018-04-05/day  Vaping Use   Vaping status: Never Used  Substance and Sexual Activity   Alcohol use: Never   Drug use: Not Currently    Types: Marijuana   Sexual activity: Never  Other Topics Concern   Not on file  Social History Narrative   Single   Lives with his parents   Trying to get his GED   DeGent date all cultures   Social Drivers of Health   Financial Resource Strain: Medium Risk (12/04/2020)   Overall Financial Resource Strain (CARDIA)    Difficulty of Paying Living Expenses: Somewhat hard  Food Insecurity: No Food Insecurity (03/19/2023)   Hunger Vital Sign    Worried About Running Out of Food in the Last Year: Never true    Ran Out of Food in the Last Year: Never true  Transportation Needs: No Transportation  Needs (03/19/2023)   PRAPARE - Administrator, Civil Service (Medical): No    Lack of Transportation (Non-Medical): No  Physical Activity: Not on file  Stress: Not on file  Social Connections: Not on file  Intimate Partner Violence: Not At Risk (03/19/2023)   Humiliation, Afraid, Rape, and Kick questionnaire    Fear of Current or Ex-Partner: No    Emotionally Abused: No    Physically Abused: No    Sexually Abused: No   Family History  Problem Relation Age of Onset   Colon cancer Father 20   Colon cancer Paternal Grandfather    Colon cancer Paternal Aunt    Hypertension Neg Hx    Diabetes Neg Hx    Coronary artery disease Neg Hx    Esophageal cancer Neg Hx    Rectal cancer Neg Hx    Stomach cancer Neg Hx    BP 102/60   Pulse 63   SpO2 97%   Wt Readings from Last 3 Encounters:  03/19/23 95.8 kg (211 lb 1.6 oz)  03/04/23 93.9 kg (207 lb)  02/19/23 98.9 kg (218 lb)   PHYSICAL EXAM: General: NAD Neck: No JVD, no thyromegaly or thyroid nodule.  Lungs: Clear to auscultation bilaterally with normal respiratory effort. CV: Nondisplaced PMI.  Heart regular S1/S2, no S3/S4, no murmur.  No peripheral edema.  No carotid bruit.  Difficult to palpate pedal pulses.  Abdomen: Soft, nontender, no hepatosplenomegaly, no distention.  Skin: Intact without lesions or rashes.  Neurologic: Alert and oriented x 3.  Psych: Normal affect. Extremities: No clubbing or cyanosis.  HEENT: Normal.   ASSESSMENT & PLAN:  1. Abdominal pain: Most recent hospitalization in 1/25 with RUQ [place] no longer showing gallstones and HIDA scan negative.  He is going to have an EGD soon.  2. CAD: Early onset CAD s/p MI with LAD PCI in 2012,followed by CABG  x 4 4/22  Admit 10/22 with NSTEMI, cath showed patent LIMA to LAD, all other grafts occluded. S/p PCI DES native mid LCx 99% stenosis. Repeat 01/2023 LHC showed coronary anatomy unchanged.  Grafts all occluded (known prior) except LIMA-LAD.  Occluded  native RCA (known from prior) with patent mid LCx stent.  No chest pain.  - He is now off Plavix (stopped 8/23 due to BRBPR). - He is on Eliquis so no ASA.   - Continue atorvastatin 80 mg daily, check lipids today.  3. Chronic Systolic CHF: Ischemic cardiomyopathy.  [employer] ICD.  Cardiac MRI in 10/22 showed LVEF 29% and RVEF 45%.  Patient has a cardiac contractility modulator.  Echo 01/2023 showed EF 30-35%, global hypokinesis, mild RV dysfunction, IVC not dilated.  GDMT has been limited by hypotension & hyperkalemia, K is now controlled with Lokelma.   Had RHC 01/2023 showing normal filling pressures, low cardiac output with CI 2 by Fick, 1.82 by thermodilution.  He is not volume overloaded by exam or HeartLogic, NYHA class II.  - Continue lasix 20 mg daily  - Continue digoxin 0.125 mg daily, check level today.  - Increase Coreg to 6.25 mg bid.  - Continue Farxiga 10 mg daily.  - Continue spironolactone 25 mg daily + Lokelma 5 g daily given history of hyperkalemia.  - Continue Entresto 24/26 bid.  4. 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  No thrombus on echo 08/22. No thrombus on cMRI 12/06/20.  No thrombus on most recent echo.   - Continue Eliquis 5 mg bid 5. Hyperkalemia: He has had a tendency towards hyperkalemia and has been on Lokelma in order to stay on spironolactone and Entresto.   - BMET today.  - Continue Lokelma 5 g daily.  6. Atrial flutter/fibrillation: Both arrhythmias noted in 10/22.  He tolerated poorly with worsened symptoms.  He had DCCV in 10/22. Previously on amio but later stopped. He has maintained SR.  - Continue Eliquis.  - Would favor ablation if he has recurrent AF.  7. Leg fatigue: Difficult to palpate pedal pulses.  - I will arrange for peripheral arterial doppler evaluation.   Follow up with APP in 6 wks.    I spent 31 minutes reviewing data, interviewing patient, and organizing the orders/followup.   Marca Ancona 04/27/23

## 2023-04-27 NOTE — Patient Instructions (Signed)
 INCREASE Carvedilol to 6.25 mg Twice daily  Labs done today, your results will be available in MyChart, we will contact you for abnormal readings.  Dopplers have been ordered for you. You will be called to have this test arranged.  Your physician recommends that you schedule a follow-up appointment in: 6 weeks.  If you have any questions or concerns before your next appointment please send [place] a message through [place] or call our office at [phone].    TO LEAVE A MESSAGE FOR THE NURSE SELECT OPTION 2, PLEASE LEAVE A MESSAGE INCLUDING: YOUR NAME DATE OF BIRTH CALL BACK NUMBER REASON FOR CALL**this is important as we prioritize the call backs  YOU WILL RECEIVE A CALL BACK THE SAME DAY AS LONG AS YOU CALL BEFORE 4:00 PM  At the [facility], you and your health needs are our priority. As part of our continuing mission to provide you with exceptional heart care, we have created designated Provider Care Teams. These Care Teams include your primary Cardiologist (physician) and Advanced Practice Providers (APPs- Physician Assistants and Nurse Practitioners) who all work together to provide you with the care you need, when you need it.   You may see any of the following providers on your designated Care Team at your next follow up: Dr Arvilla Meres Dr Marca Ancona Dr. Dorthula Nettles Dr. Clearnce Hasten Amy Filbert Schilder, NP Robbie Lis, [place] [facility] [place], [place] Brynda Peon, NP [place] Lee, NP Clarisa Kindred, NP Karle Plumber, PharmD Enos Fling, PharmD   Please be sure to bring in all your medications bottles to every appointment.    Thank you for choosing [facility]

## 2023-04-30 ENCOUNTER — Telehealth (HOSPITAL_COMMUNITY): Payer: Self-pay | Admitting: Cardiology

## 2023-04-30 DIAGNOSIS — I5022 Chronic systolic (congestive) heart failure: Secondary | ICD-10-CM

## 2023-04-30 MED ORDER — DIGOXIN 125 MCG PO TABS: 0.0625 mg | ORAL_TABLET | Freq: Every day | ORAL | 11 refills | Status: DC

## 2023-04-30 NOTE — Telephone Encounter (Signed)
-----   Message from Marca Ancona sent at 04/27/2023 11:30 PM EST ----- Good lipids.  Decrease digoxin to 0.0625 mg daily, check digoxin level as trough in 2 wks.

## 2023-04-30 NOTE — Telephone Encounter (Signed)
 Patient called.  Patient aware.

## 2023-05-04 ENCOUNTER — Telehealth (HOSPITAL_COMMUNITY): Payer: Self-pay | Admitting: Cardiology

## 2023-05-04 NOTE — Telephone Encounter (Signed)
 Patient called to report he will have repeat labs done at [facility] Lab appt at [facility] cancelled  Order printed and faxed to [phone]

## 2023-05-11 ENCOUNTER — Ambulatory Visit (INDEPENDENT_AMBULATORY_CARE_PROVIDER_SITE_OTHER): Payer: Medicaid Other

## 2023-05-11 DIAGNOSIS — I255 Ischemic cardiomyopathy: Secondary | ICD-10-CM | POA: Diagnosis not present

## 2023-05-11 LAB — CUP PACEART REMOTE DEVICE CHECK
Battery Remaining Longevity: 150 mo
Battery Remaining Percentage: 100 %
Brady Statistic RA Percent Paced: 0 %
Brady Statistic RV Percent Paced: 0 %
Date Time Interrogation Session: 20250311050000
HighPow Impedance: 71 Ohm
Implantable Lead Connection Status: 753985
Implantable Lead Connection Status: 753985
Implantable Lead Implant Date: 20221213
Implantable Lead Implant Date: 20221213
Implantable Lead Location: 753859
Implantable Lead Location: 753860
Implantable Lead Model: 673
Implantable Lead Model: 7841
Implantable Lead Serial Number: 1211072
Implantable Lead Serial Number: 160080
Implantable Pulse Generator Implant Date: 20221213
Lead Channel Impedance Value: 473 Ohm
Lead Channel Impedance Value: 515 Ohm
Lead Channel Setting Pacing Amplitude: 2 V
Lead Channel Setting Pacing Amplitude: 2.5 V
Lead Channel Setting Pacing Pulse Width: 0.4 ms
Lead Channel Setting Sensing Sensitivity: 0.5 mV
Pulse Gen Serial Number: 617464
Zone Setting Status: 755011

## 2023-05-12 ENCOUNTER — Other Ambulatory Visit (HOSPITAL_COMMUNITY): Payer: Self-pay | Admitting: Cardiology

## 2023-05-12 DIAGNOSIS — I5022 Chronic systolic (congestive) heart failure: Secondary | ICD-10-CM

## 2023-05-12 MED ORDER — ENTRESTO 24-26 MG PO TABS: 1.0000 | ORAL_TABLET | Freq: Two times a day (BID) | ORAL | 6 refills | Status: DC

## 2023-05-12 MED ORDER — APIXABAN 5 MG PO TABS: 5.0000 mg | ORAL_TABLET | Freq: Two times a day (BID) | ORAL | 6 refills | Status: DC

## 2023-05-12 NOTE — Addendum Note (Signed)
 Addended by: Theresia Bough on: 05/12/2023 12:17 PM   Modules accepted: Orders

## 2023-05-14 ENCOUNTER — Other Ambulatory Visit (HOSPITAL_COMMUNITY): Payer: Medicaid Other

## 2023-05-14 NOTE — Addendum Note (Signed)
 Encounter addended by: Howell Rucks, RDCS on: 05/14/2023 1:19 PM  Actions taken: Imaging Exam ended

## 2023-05-27 ENCOUNTER — Other Ambulatory Visit (HOSPITAL_COMMUNITY): Payer: Self-pay | Admitting: Cardiology

## 2023-05-27 MED ORDER — DAPAGLIFLOZIN PROPANEDIOL 10 MG PO TABS: 10.0000 mg | ORAL_TABLET | Freq: Every day | ORAL | 11 refills | Status: AC

## 2023-06-07 NOTE — Progress Notes (Signed)
 Advanced Heart Failure Clinic Note   Primary Care: Toma Deiters, MD HF Cardiologist: Dr. Shirlee Latch EP: Dr Lalla Brothers   HPI: Patient is a 38 y.o. with history of early onset CAD s/p CABG, ischemic cardiomyopathy, and LV thrombus. Patient had initial MI in 2012 at age 8, PCI to LAD.  He had inferoposterior MI in 4/22.  LHC showed 3 vessel disease, and patient had CABG x 4. Cardiac MRI in 5/22 showed LV EF 27% with LV thrombus, there was significant viability.  Post-op, patient had GI bleeding and anticoagulation was stopped.  He quit smoking after CABG.    Follow up 10/04/20 carvedilol decreased due to dizziness and repeat echo arranged.  Echo 8/22 EF 25-30%. He was referred to EP for ICD consideration => [employer] ICD.   Admitted 12/03/20 with NSTEMI. Coronary angiogram showed patent LIMA to LAD with occlusion of all other grafts. S/p PCI/DES to native mid LCX 99% stenosis.  Native LAD and RCA occluded. Angina resolved post PCI. Farxiga and Coreg added back, however spiro and losartan held due to hyperkalemia during this admission. His hospitalization was complicated by atrial fibrillation and flutter with RVR. He received IV amiodarone and started on Eliquis.  He underwent DCCV with conversion to NSR on 12/06/20. Discharged home 12/06/20, weight 177.5 lbs.  In 2/24, patient had cardiac contracility modulator placement.   Patient presented to [facility] in 12/24 with 2-3 months of RUQ pain, worse x 2-3 weeks.  Abdominal CT showed a stone in the cystic duct with mildly distended gallbladder but no evidence for cholecystitis.  He was discharged from the ER but told that he will need cholecystectomy.   Echo 01/2023 showed EF 30-35%, global hypokinesis, mild RV dysfunction, IVC not dilated.   Underwent cath 01/2023 showing normal filling pressures, low cardiac output with CI 2 by Fick,1.82 thermodilution. Coronary anatomy was unchanged.  Grafts all occluded (known prior) except  LIMA-LAD.  Occluded native RCA (known from prior).  Patent mid LCx stent. Post cath he was started on digoxin 0.125 mg.   He was admitted in 1/25 with RUL PNA and treated with antibiotics.  He continued to have abdominal pain, but this time abdominal US showed no gallstones and HIDA scan was negative.   Today he returns for HF follow up. Overall feeling fine. He has SOB with exertion. Does OK walking on flat ground. Uses cane for balance and leg weakness. Has noticed BRBpR after BM x 2 weeks, has EGD soon, had colonoscopy last year. He is unsure if this is related to hemorrhoids. Denies palpitations,  CP, dizziness, edema, or PND/Orthopnea. Appetite ok. No fever or chills. Weight at home 215 pounds. Taking all medications.   ECG (personally reviewed): none ordered today  [employer] ICD (personally reviewed): unable to interrogate device in clinic today.  Labs (12/24): K 5.7, creatinine 1.04, hgb 15.8, HS-TnI 32 Labs (03/02/23): K 4.9, creatinine 1 Labs (1/25): K 4, creatinine 0.79 Labs (2/25): K 4.1, creatinine 0.76, LDL 66  PMH: 1. Hyperlipidemia 2. CAD: MI at age 28 in 2012, PCI to LAD.   - Inferoposterior MI in 4/22: LHC showed 3 vessel disease.  CABG with LIMA-LAD, radial to OM1, sequential SVG-PDA and D1.  - NSTEMI-->LHC (10/22): patent LIMA to LAD, all other grafts occluded. S/p PCI DES native mid LCx 99% stenosis, LAD and RCA totally occluded.  - LHC (12/24): All SVGs occluded, LIMA-LAD patent, occluded native RCA, patent mid LCX stent.  3. Post-operative GI bleeding after  CABG 4. Chronic systolic CHF: Ischemic cardiomyopathy.   - Cardiac MRI (5/22): LV EF 27%, RV EF 57%, apical thrombus, extensive viability.  - Echo (8/22) EF 20-25% - Echo (10/22): EF 35-40% - cMRI (10/22): LVEF 29%, RV EF 45%, no LV thrombus, LGE suggestive of prior MI.  - [employer] ICD  - Cardiac contractility modulator implantation in 2/24.  - Echo (12/24): EF 30-35%, global hypokinesis, mild RV  dysfunction, IVC not dilated. - RHC (12/24): mean RA 6, PA 28/13, mean PCWP 13, CI 1.82 thermo/2.0 Fick 5. LV thrombus  - No thrombus on 10/22 cMRI.  6. Prior smoker: Quit 5/22.  7. H/o hyperkalemia 8. Atrial fibrillation/flutter: Paroxysmal.  - DCCV 10/22 9. ABIs (5/22): Normal.  10. Colonic polyps 11. Symptomatic cholelithiasis  Current Outpatient Medications  Medication Sig Dispense Refill   apixaban (ELIQUIS) 5 MG TABS tablet Take 1 tablet (5 mg total) by mouth 2 (two) times daily. 60 tablet 6   atorvastatin (LIPITOR) 80 MG tablet Take 1 tablet (80 mg total) by mouth daily. 30 tablet 6   carvedilol (COREG) 6.25 MG tablet Take 1 tablet (6.25 mg total) by mouth 2 (two) times daily with a meal. 90 tablet 3   dapagliflozin propanediol (FARXIGA) 10 MG TABS tablet Take 1 tablet (10 mg total) by mouth daily before breakfast. 30 tablet 11   digoxin (LANOXIN) 0.125 MG tablet Take 0.5 tablets (0.0625 mg total) by mouth daily. 15 tablet 11   furosemide (LASIX) 20 MG tablet Take 20 mg by mouth daily.     linaclotide (LINZESS) 290 MCG CAPS capsule Take 1 capsule (290 mcg total) by mouth daily before breakfast. 30 capsule 5   nitroGLYCERIN (NITROSTAT) 0.4 MG SL tablet Place 1 tablet (0.4 mg total) under the tongue every 5 (five) minutes x 3 doses as needed for chest pain. 15 tablet 3   Omega-3 Fatty Acids (FISH OIL) 500 MG CAPS Take 2,000 mg by mouth daily.     ondansetron (ZOFRAN-ODT) 4 MG disintegrating tablet Take 1 tablet (4 mg total) by mouth every 8 (eight) hours as needed for nausea or vomiting. 20 tablet 0   pantoprazole (PROTONIX) 40 MG tablet Take 1 tablet (40 mg total) by mouth 2 (two) times daily before a meal. Continue Protonix 40 mg twice a day for 8 weeks, then continue daily 60 tablet 3   polyethylene glycol (MIRALAX) 17 g packet Take 17 g by mouth daily. 30 each 3   Probiotic Product (PROBIOTIC ADVANCED PO) Take 1 tablet by mouth daily.     sacubitril-valsartan (ENTRESTO) 24-26 MG  Take 1 tablet by mouth 2 (two) times daily. 60 tablet 6   sodium zirconium cyclosilicate (LOKELMA) 5 g packet Take 5 g by mouth daily. 30 packet 6   spironolactone (ALDACTONE) 25 MG tablet Take 1 tablet (25 mg total) by mouth daily.     No current facility-administered medications for this encounter.   Allergies  Allergen Reactions   Codeine Itching   Social History   Socioeconomic History   Marital status: Single    Spouse name: Not on file   Number of children: Not on file   Years of education: Not on file   Highest education level: High school graduate  Occupational History   Occupation: Not actively working    Comment: applied for disability x 2.   Tobacco Use   Smoking status: Former    Current packs/day: 0.00    Average packs/day: 1 pack/day for 22.0 years (22.0 ttl  pk-yrs)    Types: Cigarettes, Cigars    Start date: 06/29/1998    Quit date: 06/28/2020    Years since quitting: 2.9   Smokeless tobacco: Never   Tobacco comments:    cigarettes stopped in 2020, [employer] started in 2018-04-05/day  Vaping Use   Vaping status: Never Used  Substance and Sexual Activity   Alcohol use: Never   Drug use: Not Currently    Types: Marijuana   Sexual activity: Never  Other Topics Concern   Not on file  Social History Narrative   Single   Lives with his parents   Trying to get his GED   DeGent date all cultures   Social Drivers of Health   Financial Resource Strain: Medium Risk (12/04/2020)   Overall Financial Resource Strain (CARDIA)    Difficulty of Paying Living Expenses: Somewhat hard  Food Insecurity: No Food Insecurity (03/19/2023)   Hunger Vital Sign    Worried About Running Out of Food in the Last Year: Never true    Ran Out of Food in the Last Year: Never true  Transportation Needs: No Transportation Needs (03/19/2023)   PRAPARE - Administrator, Civil Service (Medical): No    Lack of Transportation (Non-Medical): No  Physical Activity: Not on file   Stress: Not on file  Social Connections: Not on file  Intimate Partner Violence: Not At Risk (03/19/2023)   Humiliation, Afraid, Rape, and Kick questionnaire    Fear of Current or Ex-Partner: No    Emotionally Abused: No    Physically Abused: No    Sexually Abused: No   Family History  Problem Relation Age of Onset   Colon cancer Father 44   Colon cancer Paternal Grandfather    Colon cancer Paternal Aunt    Hypertension Neg Hx    Diabetes Neg Hx    Coronary artery disease Neg Hx    Esophageal cancer Neg Hx    Rectal cancer Neg Hx    Stomach cancer Neg Hx    BP 98/70   Pulse 64   Wt 97.4 kg (214 lb 12.8 oz)   SpO2 96%   BMI 28.34 kg/m   Wt Readings from Last 3 Encounters:  06/08/23 97.4 kg (214 lb 12.8 oz)  03/19/23 95.8 kg (211 lb 1.6 oz)  03/04/23 93.9 kg (207 lb)   PHYSICAL EXAM: General:  NAD. No resp difficulty, walked into clinic with cane, chronically-ill appearing HEENT: Normal Neck: Supple. No JVD. Cor: Regular rate & rhythm. No rubs, gallops or murmurs. Lungs: Clear Abdomen: Soft, nontender, nondistended.  Extremities: No cyanosis, clubbing, rash, edema Neuro: Alert & oriented x 3, moves all 4 extremities w/o difficulty. Affect pleasant.  ASSESSMENT & PLAN:  1. Abdominal pain: Most recent hospitalization in 1/25 with RUQ [place] no longer showing gallstones and HIDA scan negative.  He is going to have an EGD soon.  2. CAD: Early onset CAD s/p MI with LAD PCI in 2012,followed by CABG  x 4 4/22  Admit 10/22 with NSTEMI, cath showed patent LIMA to LAD, all other grafts occluded. S/p PCI DES native mid LCx 99% stenosis. Repeat 01/2023 LHC showed coronary anatomy unchanged.  Grafts all occluded (known prior) except LIMA-LAD.  Occluded native RCA (known from prior) with patent mid LCx stent.  No chest pain.  - He is now off Plavix (stopped 8/23 due to BRBPR). - He is on Eliquis so no ASA.   - Continue atorvastatin 80 mg daily, good lipids  2/25  3. Chronic Systolic CHF:  Ischemic cardiomyopathy.  [employer] ICD.  Cardiac MRI in 10/22 showed LVEF 29% and RVEF 45%.  Patient has a cardiac contractility modulator.  Echo 01/2023 showed EF 30-35%, global hypokinesis, mild RV dysfunction, IVC not dilated.  GDMT has been limited by hypotension & hyperkalemia, K is now controlled with Lokelma.   Had RHC 01/2023 showing normal filling pressures, low cardiac output with CI 2 by Fick, 1.82 by thermodilution.  NYHA II. He is not volume overloaded by exam.    - Continue lasix 20 mg daily. BMET/BNP today - Continue digoxin 0.0625 mg daily, dig level 0.3 (05/14/23) - Continue Coreg 6.25 mg bid.  - Continue Farxiga 10 mg daily.  - Continue spironolactone 25 mg daily + Lokelma 5 g daily given history of hyperkalemia.  - Continue Entresto 24/26 bid.  4. 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  No thrombus on echo 08/22. No thrombus on cMRI 12/06/20.  No thrombus on most recent echo.   - Continue Eliquis 5 mg bid. Check CBC with BRBpR.  5. Hyperkalemia: He has had a tendency towards hyperkalemia and has been on Lokelma in order to stay on spironolactone and Entresto.   - Continue Lokelma 5 g daily. BMET today. 6. Atrial flutter/fibrillation: Both arrhythmias noted in 10/22.  He tolerated poorly with worsened symptoms.  He had DCCV in 10/22. Previously on amio but later stopped. He has maintained SR. Regular on exam today. - Continue Eliquis.  - Would favor ablation if he has recurrent AF.  7. Leg fatigue: Difficult to palpate pedal pulses.  - Peripheral arterial dopplers have been ordered, will see is we can schedule these soon.   Follow up in 4 months with Dr. Shirlee Latch. Provided letter for food stamps, as requested.  Anderson Malta [facility] FNP-BC 06/08/23

## 2023-06-08 ENCOUNTER — Encounter (HOSPITAL_COMMUNITY): Payer: Self-pay

## 2023-06-08 ENCOUNTER — Ambulatory Visit (HOSPITAL_COMMUNITY)
Admission: RE | Admit: 2023-06-08 | Discharge: 2023-06-08 | Disposition: A | Discharge status: Home / Self Care | Payer: Medicaid Other | Source: Ambulatory Visit | Attending: Family Medicine | Admitting: Family Medicine

## 2023-06-08 VITALS — BP 98/70 | HR 64 | Wt 214.8 lb

## 2023-06-08 DIAGNOSIS — Z79899 Other long term (current) drug therapy: Secondary | ICD-10-CM | POA: Insufficient documentation

## 2023-06-08 DIAGNOSIS — E875 Hyperkalemia: Secondary | ICD-10-CM | POA: Diagnosis not present

## 2023-06-08 DIAGNOSIS — K625 Hemorrhage of anus and rectum: Secondary | ICD-10-CM | POA: Insufficient documentation

## 2023-06-08 DIAGNOSIS — Z951 Presence of aortocoronary bypass graft: Secondary | ICD-10-CM | POA: Diagnosis not present

## 2023-06-08 DIAGNOSIS — I5022 Chronic systolic (congestive) heart failure: Secondary | ICD-10-CM | POA: Insufficient documentation

## 2023-06-08 DIAGNOSIS — Z7984 Long term (current) use of oral hypoglycemic drugs: Secondary | ICD-10-CM | POA: Diagnosis not present

## 2023-06-08 DIAGNOSIS — I4892 Unspecified atrial flutter: Secondary | ICD-10-CM | POA: Diagnosis not present

## 2023-06-08 DIAGNOSIS — I255 Ischemic cardiomyopathy: Secondary | ICD-10-CM | POA: Insufficient documentation

## 2023-06-08 DIAGNOSIS — I480 Paroxysmal atrial fibrillation: Secondary | ICD-10-CM | POA: Insufficient documentation

## 2023-06-08 DIAGNOSIS — R109 Unspecified abdominal pain: Secondary | ICD-10-CM | POA: Insufficient documentation

## 2023-06-08 DIAGNOSIS — Z87891 Personal history of nicotine dependence: Secondary | ICD-10-CM | POA: Insufficient documentation

## 2023-06-08 DIAGNOSIS — I252 Old myocardial infarction: Secondary | ICD-10-CM | POA: Diagnosis not present

## 2023-06-08 DIAGNOSIS — I25119 Atherosclerotic heart disease of native coronary artery with unspecified angina pectoris: Secondary | ICD-10-CM | POA: Insufficient documentation

## 2023-06-08 DIAGNOSIS — Z86718 Personal history of other venous thrombosis and embolism: Secondary | ICD-10-CM | POA: Diagnosis not present

## 2023-06-08 DIAGNOSIS — Z7901 Long term (current) use of anticoagulants: Secondary | ICD-10-CM | POA: Insufficient documentation

## 2023-06-08 DIAGNOSIS — I513 Intracardiac thrombosis, not elsewhere classified: Secondary | ICD-10-CM

## 2023-06-08 DIAGNOSIS — I2510 Atherosclerotic heart disease of native coronary artery without angina pectoris: Secondary | ICD-10-CM

## 2023-06-08 DIAGNOSIS — Z955 Presence of coronary angioplasty implant and graft: Secondary | ICD-10-CM | POA: Insufficient documentation

## 2023-06-08 LAB — BASIC METABOLIC PANEL WITH GFR
Anion gap: 8 (ref 5–15)
BUN: 17 mg/dL (ref 6–20)
CO2: 21 mmol/L — ABNORMAL LOW (ref 22–32)
Calcium: 9 mg/dL (ref 8.9–10.3)
Chloride: 112 mmol/L — ABNORMAL HIGH (ref 98–111)
Creatinine, Ser: 0.99 mg/dL (ref 0.61–1.24)
GFR, Estimated: >60 mL/min (ref >60)
Glucose, Bld: 95 mg/dL (ref 70–99)
Potassium: 4.6 mmol/L (ref 3.5–5.1)
Sodium: 141 mmol/L (ref 135–145)

## 2023-06-08 LAB — BRAIN NATRIURETIC PEPTIDE: B Natriuretic Peptide: 135.4 pg/mL — ABNORMAL HIGH (ref 0.0–100.0)

## 2023-06-08 LAB — CBC
HCT: 45.5 % (ref 39.0–52.0)
Hemoglobin: 14.4 g/dL (ref 13.0–17.0)
MCH: 29.6 pg (ref 26.0–34.0)
MCHC: 31.6 g/dL (ref 30.0–36.0)
MCV: 93.6 fL (ref 80.0–100.0)
Platelets: 247 10*3/uL (ref 150–400)
RBC: 4.86 MIL/uL (ref 4.22–5.81)
RDW: 14.2 % (ref 11.5–15.5)
WBC: 8.6 10*3/uL (ref 4.0–10.5)
nRBC: 0 % (ref 0.0–0.2)

## 2023-06-08 NOTE — Patient Instructions (Signed)
 Medication Changes:  No Changes In Medications at this time.   Lab Work:  Labs done today, your results will be available in MyChart, we will contact you for abnormal readings.  SCHEDULING WILL CALL YOU TO GET SCHEDULED FOR LOWER EXTREMITY ARTERIAL DUPLEX AT [facility] OFFICE  Follow-Up in: 4 MONTHS WITH DR. Shirlee Latch PLEASE CALL OUR OFFICE AROUND JULY TO GET SCHEDULED FOR YOUR APPOINTMENT. PHONE NUMBER IS [phone] OPTION 2    At the Advanced [facility] Failure Clinic, you and your health needs are our priority. We have a designated team specialized in the treatment of Heart Failure. This Care Team includes your primary Heart Failure Specialized Cardiologist (physician), Advanced Practice Providers (APPs- Physician Assistants and Nurse Practitioners), and Pharmacist who all work together to provide you with the care you need, when you need it.   You may see any of the following providers on your designated Care Team at your next follow up:  Dr. Arvilla Meres Dr. Marca Ancona Dr. Dorthula Nettles Dr. Theresia Bough Tonye Becket, NP Robbie Lis, [place] [facility] [place], [place] Brynda Peon, NP [place] Lee, NP Karle Plumber, PharmD   Please be sure to bring in all your medications bottles to every appointment.   Need to Contact us:  If you have any questions or concerns before your next appointment please send [place] a message through [place] or call our office at [phone].    TO LEAVE A MESSAGE FOR THE NURSE SELECT OPTION 2, PLEASE LEAVE A MESSAGE INCLUDING: YOUR NAME DATE OF BIRTH CALL BACK NUMBER REASON FOR CALL**this is important as we prioritize the call backs  YOU WILL RECEIVE A CALL BACK THE SAME DAY AS LONG AS YOU CALL BEFORE 4:00 PM

## 2023-06-15 ENCOUNTER — Telehealth (HOSPITAL_COMMUNITY): Payer: Self-pay

## 2023-06-15 NOTE — Telephone Encounter (Signed)
 Patient wants to know if its ok to have LE artrial doppler on 4/23 and EDG on 4/24. Patient was told to call HF clinic to be sure.

## 2023-06-16 ENCOUNTER — Telehealth: Payer: Self-pay | Admitting: Gastroenterology

## 2023-06-16 NOTE — Telephone Encounter (Signed)
 Patient notified

## 2023-06-16 NOTE — Telephone Encounter (Signed)
 Procedure:Endoscopy Procedure date: 06/24/23 Procedure location: [facility] Arrival Time: 6:00 am Spoke with the patient Y/N: Yes Any prep concerns? No  Has the patient obtained the prep from the pharmacy ? No prep needed Do you have a care partner and transportation: Yes Any additional concerns? Pt asked again about holding Eliquis, pt was told that a clearance for a 2 day hold was in the chart.

## 2023-06-17 ENCOUNTER — Encounter (HOSPITAL_COMMUNITY): Payer: Self-pay | Admitting: Internal Medicine

## 2023-06-21 ENCOUNTER — Telehealth (HOSPITAL_COMMUNITY): Payer: Self-pay | Admitting: Cardiology

## 2023-06-21 ENCOUNTER — Other Ambulatory Visit (HOSPITAL_COMMUNITY): Payer: Self-pay | Admitting: Cardiology

## 2023-06-21 DIAGNOSIS — I739 Peripheral vascular disease, unspecified: Secondary | ICD-10-CM

## 2023-06-21 MED ORDER — SPIRONOLACTONE 25 MG PO TABS: 25.0000 mg | ORAL_TABLET | Freq: Every day | ORAL | 6 refills | Status: DC

## 2023-06-21 MED ORDER — ATORVASTATIN CALCIUM 80 MG PO TABS: 80.0000 mg | ORAL_TABLET | Freq: Every day | ORAL | 6 refills | Status: DC

## 2023-06-21 MED ORDER — FUROSEMIDE 20 MG PO TABS
20.0000 mg | ORAL_TABLET | Freq: Every day | ORAL | 6 refills | Status: DC
Start: 2023-06-21 — End: 2024-01-19

## 2023-06-21 NOTE — Telephone Encounter (Signed)
 Order placed

## 2023-06-21 NOTE — Telephone Encounter (Signed)
-----   Message from Elmarie Hacking sent at 06/21/2023 11:36 AM EDT ----- Sure, ok to order ----- Message ----- From: Edris Gowers, CMA Sent: 06/21/2023  11:28 AM EDT To: Elmarie Hacking, FNP  [facility] called, they are requesting an order for ABI's in addition to LE arterial duplex pt is currently scheduled for 4/23.   Ok to place order?

## 2023-06-23 ENCOUNTER — Encounter

## 2023-06-23 NOTE — Anesthesia Preprocedure Evaluation (Signed)
 Anesthesia Evaluation  Patient identified by MRN, date of birth, ID band Patient awake    Reviewed: Allergy & Precautions, Patient's Chart, lab work & pertinent test results, reviewed documented beta blocker date and time   History of Anesthesia Complications Negative for: history of anesthetic complications  Airway Mallampati: II  TM Distance: >3 FB Neck ROM: Full    Dental  (+) Poor Dentition, Missing   Pulmonary former smoker   Pulmonary exam normal        Cardiovascular hypertension, Pt. on medications and Pt. on home beta blockers + CAD, + Past MI, + CABG (07/03/20) and +CHF  + dysrhythmias Atrial Fibrillation  Rhythm:Irregular Rate:Normal  IMPRESSIONS     1. Global hypokinesis worse in the inferior wall . Left ventricular  ejection fraction, by estimation, is 30 to 35%. The left ventricle has  moderately decreased function. The left ventricle demonstrates global  hypokinesis. Left ventricular diastolic  parameters were normal.   2. Right ventricular systolic function is normal. The right ventricular  size is normal.   3. The mitral valve is abnormal. Mild mitral valve regurgitation. No  evidence of mitral stenosis.   4. The aortic valve is tricuspid. There is mild calcification of the  aortic valve. There is mild thickening of the aortic valve. Aortic valve  regurgitation is not visualized. Aortic valve sclerosis is present, with  no evidence of aortic valve stenosis.   5. The inferior vena cava is normal in size with greater than 50%  respiratory variability, suggesting right atrial pressure of 3 mmHg.     Neuro/Psych negative neurological ROS  negative psych ROS   GI/Hepatic negative GI ROS, Neg liver ROS,,,  Endo/Other  negative endocrine ROS    Renal/GU negative Renal ROS  negative genitourinary   Musculoskeletal negative musculoskeletal ROS (+)    Abdominal  (+)  Abdomen: soft.   Peds   Hematology negative hematology ROS (+)   Anesthesia Other Findings Day of surgery medications reviewed with patient.  Reproductive/Obstetrics negative OB ROS                              Anesthesia Physical Anesthesia Plan  ASA: 4  Anesthesia Plan: MAC   Post-op Pain Management:    Induction: Intravenous  PONV Risk Score and Plan: Propofol  infusion and Treatment may vary due to age or medical condition  Airway Management Planned: Natural Airway  Additional Equipment:   Intra-op Plan:   Post-operative Plan:   Informed Consent: I have reviewed the patients History and Physical, chart, labs and discussed the procedure including the risks, benefits and alternatives for the proposed anesthesia with the patient or authorized representative who has indicated his/her understanding and acceptance.     Dental advisory given  Plan Discussed with: CRNA  Anesthesia Plan Comments:          Anesthesia Quick Evaluation

## 2023-06-24 ENCOUNTER — Other Ambulatory Visit: Payer: Self-pay

## 2023-06-24 ENCOUNTER — Encounter (HOSPITAL_COMMUNITY)
Admission: RE | Disposition: A | Discharge status: Home / Self Care | Payer: Self-pay | Source: Home / Self Care | Attending: Internal Medicine

## 2023-06-24 ENCOUNTER — Ambulatory Visit (HOSPITAL_COMMUNITY): Payer: Self-pay

## 2023-06-24 ENCOUNTER — Ambulatory Visit (HOSPITAL_COMMUNITY)
Admission: RE | Admit: 2023-06-24 | Discharge: 2023-06-24 | Disposition: A | Discharge status: Home / Self Care | Payer: Medicaid Other | Attending: Internal Medicine | Admitting: Internal Medicine

## 2023-06-24 ENCOUNTER — Encounter (HOSPITAL_COMMUNITY): Payer: Self-pay | Admitting: Internal Medicine

## 2023-06-24 ENCOUNTER — Ambulatory Visit (HOSPITAL_BASED_OUTPATIENT_CLINIC_OR_DEPARTMENT_OTHER): Payer: Self-pay

## 2023-06-24 DIAGNOSIS — K31A11 Gastric intestinal metaplasia without dysplasia, involving the antrum: Secondary | ICD-10-CM | POA: Diagnosis not present

## 2023-06-24 DIAGNOSIS — I4891 Unspecified atrial fibrillation: Secondary | ICD-10-CM

## 2023-06-24 DIAGNOSIS — Z951 Presence of aortocoronary bypass graft: Secondary | ICD-10-CM | POA: Diagnosis not present

## 2023-06-24 DIAGNOSIS — Z87891 Personal history of nicotine dependence: Secondary | ICD-10-CM | POA: Insufficient documentation

## 2023-06-24 DIAGNOSIS — K449 Diaphragmatic hernia without obstruction or gangrene: Secondary | ICD-10-CM | POA: Diagnosis not present

## 2023-06-24 DIAGNOSIS — K2970 Gastritis, unspecified, without bleeding: Secondary | ICD-10-CM | POA: Diagnosis not present

## 2023-06-24 DIAGNOSIS — Z7901 Long term (current) use of anticoagulants: Secondary | ICD-10-CM | POA: Insufficient documentation

## 2023-06-24 DIAGNOSIS — K3189 Other diseases of stomach and duodenum: Secondary | ICD-10-CM | POA: Diagnosis not present

## 2023-06-24 DIAGNOSIS — R1013 Epigastric pain: Secondary | ICD-10-CM | POA: Diagnosis present

## 2023-06-24 DIAGNOSIS — K319 Disease of stomach and duodenum, unspecified: Secondary | ICD-10-CM | POA: Insufficient documentation

## 2023-06-24 DIAGNOSIS — I2510 Atherosclerotic heart disease of native coronary artery without angina pectoris: Secondary | ICD-10-CM | POA: Diagnosis not present

## 2023-06-24 HISTORY — PX: ESOPHAGOGASTRODUODENOSCOPY (EGD) WITH PROPOFOL: SHX5813

## 2023-06-24 SURGERY — ESOPHAGOGASTRODUODENOSCOPY (EGD) WITH PROPOFOL
Anesthesia: Monitor Anesthesia Care

## 2023-06-24 MED ORDER — OXYCODONE HCL 5 MG PO TABS: 5.0000 mg | ORAL_TABLET | Freq: Once | ORAL | Status: DC | PRN

## 2023-06-24 MED ORDER — PROPOFOL 10 MG/ML IV BOLUS
INTRAVENOUS | Status: DC | PRN
  Administered 2023-06-24: 50 mg via INTRAVENOUS
  Administered 2023-06-24 (×2): 30 mg via INTRAVENOUS

## 2023-06-24 MED ORDER — ACETAMINOPHEN 10 MG/ML IV SOLN
1000.0000 mg | Freq: Once | INTRAVENOUS | Status: DC | PRN
  Filled 2023-06-24: qty 100

## 2023-06-24 MED ORDER — PROPOFOL 500 MG/50ML IV EMUL
INTRAVENOUS | Status: DC | PRN
  Administered 2023-06-24: 125 ug/kg/min via INTRAVENOUS

## 2023-06-24 MED ORDER — OXYCODONE HCL 5 MG/5ML PO SOLN: 5.0000 mg | Freq: Once | ORAL | Status: DC | PRN

## 2023-06-24 MED ORDER — PROPOFOL 1000 MG/100ML IV EMUL
INTRAVENOUS | Status: AC
  Filled 2023-06-24: qty 100

## 2023-06-24 MED ORDER — DROPERIDOL 2.5 MG/ML IJ SOLN
0.6250 mg | Freq: Once | INTRAMUSCULAR | Status: DC | PRN
  Filled 2023-06-24: qty 2

## 2023-06-24 MED ORDER — PANTOPRAZOLE SODIUM 40 MG PO TBEC: 40.0000 mg | DELAYED_RELEASE_TABLET | Freq: Two times a day (BID) | ORAL | 3 refills | Status: DC

## 2023-06-24 MED ORDER — FENTANYL CITRATE (PF) 100 MCG/2ML IJ SOLN: 25.0000 ug | INTRAMUSCULAR | Status: DC | PRN

## 2023-06-24 MED ORDER — LIDOCAINE HCL (CARDIAC) PF 100 MG/5ML IV SOSY
PREFILLED_SYRINGE | INTRAVENOUS | Status: DC | PRN
  Administered 2023-06-24: 100 mg via INTRAVENOUS

## 2023-06-24 MED ORDER — PHENYLEPHRINE HCL-NACL 20-0.9 MG/250ML-% IV SOLN
INTRAVENOUS | Status: DC | PRN
  Administered 2023-06-24: 60 ug/min via INTRAVENOUS

## 2023-06-24 MED ORDER — PROPOFOL 500 MG/50ML IV EMUL
INTRAVENOUS | Status: AC
  Filled 2023-06-24: qty 50

## 2023-06-24 MED ORDER — SODIUM CHLORIDE 0.9 % IV SOLN: INTRAVENOUS | Status: DC

## 2023-06-24 MED ORDER — EPINEPHRINE 1 MG/10ML IJ SOSY
PREFILLED_SYRINGE | INTRAMUSCULAR | Status: DC | PRN
Start: 2023-06-24 — End: 2023-06-24
  Administered 2023-06-24 (×2): 5 ug via INTRAVENOUS

## 2023-06-24 SURGICAL SUPPLY — 13 items
BLOCK BITE 60FR ADLT L/F BLUE (MISCELLANEOUS) ×1 IMPLANT
ELECTRODE REM PT RTRN 9FT ADLT (ELECTROSURGICAL) IMPLANT
FORCEP RJ3 GP 1.8X160 W-NEEDLE (CUTTING FORCEPS) IMPLANT
FORCEPS BIOP RAD 4 LRG CAP 4 (CUTTING FORCEPS) IMPLANT
NDL SCLEROTHERAPY 25GX240 (NEEDLE) IMPLANT
NEEDLE SCLEROTHERAPY 25GX240 (NEEDLE) IMPLANT
PROBE APC STR FIRE (PROBE) IMPLANT
PROBE INJECTION GOLD 7FR (MISCELLANEOUS) IMPLANT
SNARE SHORT THROW 13M SML OVAL (MISCELLANEOUS) IMPLANT
SYR 50ML LL SCALE MARK (SYRINGE) IMPLANT
TUBING ENDO SMARTCAP PENTAX (MISCELLANEOUS) ×2 IMPLANT
TUBING IRRIGATION ENDOGATOR (MISCELLANEOUS) ×1 IMPLANT
WATER STERILE IRR 1000ML POUR (IV SOLUTION) IMPLANT

## 2023-06-24 NOTE — Discharge Instructions (Signed)

## 2023-06-24 NOTE — Transfer of Care (Signed)
 Immediate Anesthesia Transfer of Care Note  Patient: Wyatt Donovan  Procedure(s) Performed: ESOPHAGOGASTRODUODENOSCOPY (EGD) WITH PROPOFOL   Patient Location: PACU and Endoscopy Unit  Anesthesia Type:MAC  Level of Consciousness: drowsy  Airway & Oxygen Therapy: Patient Spontanous Breathing and Patient connected to face mask oxygen  Post-op Assessment: Report given to RN and Post -op Vital signs reviewed and stable  Post vital signs: Reviewed and stable  Last Vitals:  Vitals Value Taken Time  BP 98/55 06/24/23 0801  Temp    Pulse 66 06/24/23 0803  Resp 18 06/24/23 0803  SpO2 100 % 06/24/23 0803  Vitals shown include unfiled device data.  Last Pain:  Vitals:   06/24/23 0801  TempSrc: Tympanic  PainSc:          Complications: No notable events documented.

## 2023-06-24 NOTE — Op Note (Signed)
 [facility] Patient Name: Wyatt Donovan Procedure Date: 06/24/2023 MRN: [id number] Attending MD: Wyatt Donovan , , [id number] Date of Birth: Nov 01, 1985 CSN: [id number] Age: 38 Admit Type: Outpatient Procedure:                Upper GI endoscopy Indications:              Epigastric abdominal pain Providers:                Wyatt Donovan" Wyatt Mcardle, RN,                            Wyatt Donovan, Technician Referring MD:             Wyatt Duck MD, MD Medicines:                Monitored Anesthesia Care Complications:            No immediate complications. Estimated Blood Loss:     Estimated blood loss was minimal. Procedure:                Pre-Anesthesia Assessment:                           - Prior to the procedure, a History and Physical                            was performed, and patient medications and                            allergies were reviewed. The patient's tolerance of                            previous anesthesia was also reviewed. The risks                            and benefits of the procedure and the sedation                            options and risks were discussed with the patient.                            All questions were answered, and informed consent                            was obtained. Prior Anticoagulants: The patient has                            taken Eliquis  (apixaban ), last dose was 3 days                            prior to procedure. ASA Grade Assessment: III - A                            patient with severe systemic disease. After  reviewing the risks and benefits, the patient was                            deemed in satisfactory condition to undergo the                            procedure.                           After obtaining informed consent, the endoscope was                            passed under direct vision. Throughout the                            procedure,  the patient's blood pressure, pulse, and                            oxygen saturations were monitored continuously. The                            [id number]-H190 ([id number]) Olympus endoscope was introduced                            through the mouth, and advanced to the second part                            of duodenum. The upper GI endoscopy was                            accomplished without difficulty. The patient                            tolerated the procedure well. Scope In: Scope Out: Findings:      The examined esophagus was normal.      A 2 cm hiatal hernia was present.      Localized mucosal variance characterized by sloughing and altered       texture was found at the gastroesophageal junction and in the cardia.       Biopsies were taken with a cold forceps for histology.      Localized inflammation characterized by congestion (edema), erosions and       erythema was found in the gastric antrum. Biopsies were taken with a       cold forceps for histology.      The examined duodenum was normal. Impression:               - Normal esophagus.                           - 2 cm hiatal hernia.                           - Gastric mucosal variant. Biopsied.                           -  Gastritis. Biopsied.                           - Normal examined duodenum. Moderate Sedation:      Not Applicable - Patient had care per Anesthesia. Recommendation:           - Discharge patient to home (with escort).                           - Await pathology results.                           - Overall patient's ab pain has improved,                            suggesting that perhaps this patient was mostly                            related to constipation and/or GERD after                            increasing his PPI to BID. Blood counts continue to                            remain completely stable.                           - Continue pantoprazole  40 mg twice daily.                           -  Okay to restart your Eliquis  tomorrow.                           - Return to GI clinic in 2-3 months.                           - The findings and recommendations were discussed                            with the patient. Procedure Code(s):        --- Professional ---                           [id number], Esophagogastroduodenoscopy, flexible,                            transoral; with biopsy, single or multiple Diagnosis Code(s):        --- Professional ---                           K44.9, Diaphragmatic hernia without obstruction or                            gangrene                           K31.89, Other diseases  of stomach and duodenum                           K29.70, Gastritis, unspecified, without bleeding                           R10.13, Epigastric pain CPT copyright 2022 American Medical Association. All rights reserved. The codes documented in this report are preliminary and upon coder review may  be revised to meet current compliance requirements. Dr Wyatt Donovan "Wyatt Donovan" Wyatt Donovan,  06/24/2023 8:03:49 AM Number of Addenda: 0

## 2023-06-24 NOTE — H&P (Signed)
 GASTROENTEROLOGY PROCEDURE H&P NOTE   Primary Care Physician: Veda Gerald, MD    Reason for Procedure:   Epigastric ab pain  Plan:    EGD  Patient is appropriate for endoscopic procedure(s) in the ambulatory (hospital) setting.  The nature of the procedure, as well as the risks, benefits, and alternatives were carefully and thoroughly reviewed with the patient. Ample time for discussion and questions allowed. The patient understood, was satisfied, and agreed to proceed.     HPI: Wyatt Donovan is a 38 y.o. male who presents for EGD for epigastric ab pain  Past Medical History:  Diagnosis Date   Atrial fibrillation (HCC)    Atypical chest pain    CAD (coronary artery disease)    CHF (congestive heart failure) (HCC)    Dyslipidemia    Ex-smoker    History of depression    Hyperlipidemia    Insomnia    Ischemic cardiomyopathy    Ejection fraction 40-45%   NSTEMI (non-ST elevated myocardial infarction) (HCC) 08/01/2007   Treated with a bare metal stent to the proximal LAD, pt states MI x3   Suicide attempt (HCC) 01/30/2001    Past Surgical History:  Procedure Laterality Date   ADENOIDECTOMY     CARDIOVERSION N/A 12/06/2020   Procedure: CARDIOVERSION;  Surgeon: Darlis Eisenmenger, MD;  Location: [facility] ENDOSCOPY;  Service: Cardiovascular;  Laterality: N/A;   CCM GENERATOR AND A/V LEAD INSERTION N/A 04/17/2022   Procedure: CCM GENERATOR AND A/V LEAD INSERTION;  Surgeon: Boyce Byes, MD;  Location: [facility] INVASIVE CV LAB;  Service: Cardiovascular;  Laterality: N/A;   COLONOSCOPY     CORONARY ARTERY BYPASS GRAFT N/A 07/03/2020   Procedure: CORONARY ARTERY BYPASS GRAFTING (CABG) times four using left internal mammary artery, right arm radial artery and right leg saphenous vein;  Surgeon: Hilarie Lovely, MD;  Location: [facility] OR;  Service: Open Heart Surgery;  Laterality: N/A;   CORONARY STENT INTERVENTION N/A 12/03/2020   Procedure: CORONARY STENT INTERVENTION;   Surgeon: Swaziland, Peter M, MD;  Location: [facility] INVASIVE CV LAB;  Service: Cardiovascular;  Laterality: N/A;   CORONARY STENT PLACEMENT     Bare metal stent to proximal LAD   ICD IMPLANT N/A 02/10/2021   Procedure: ICD IMPLANT;  Surgeon: Boyce Byes, MD;  Location: [facility] INVASIVE CV LAB;  Service: Cardiovascular;  Laterality: N/A;   LEFT HEART CATH AND CORONARY ANGIOGRAPHY N/A 07/01/2020   Procedure: LEFT HEART CATH AND CORONARY ANGIOGRAPHY;  Surgeon: Avanell Leigh, MD;  Location: [facility] INVASIVE CV LAB;  Service: Cardiovascular;  Laterality: N/A;   LEFT HEART CATH AND CORS/GRAFTS ANGIOGRAPHY N/A 12/03/2020   Procedure: LEFT HEART CATH AND CORS/GRAFTS ANGIOGRAPHY;  Surgeon: Swaziland, Peter M, MD;  Location: [facility] INVASIVE CV LAB;  Service: Cardiovascular;  Laterality: N/A;   RADIAL ARTERY HARVEST Right 07/03/2020   Procedure: RADIAL ARTERY HARVEST;  Surgeon: Hilarie Lovely, MD;  Location: [facility] OR;  Service: Open Heart Surgery;  Laterality: Right;   RIGHT/LEFT HEART CATH AND CORONARY/GRAFT ANGIOGRAPHY N/A 02/19/2023   Procedure: RIGHT/LEFT HEART CATH AND CORONARY/GRAFT ANGIOGRAPHY;  Surgeon: Darlis Eisenmenger, MD;  Location: [facility] INVASIVE CV LAB;  Service: Cardiovascular;  Laterality: N/A;   TEE WITHOUT CARDIOVERSION N/A 07/03/2020   Procedure: TRANSESOPHAGEAL ECHOCARDIOGRAM (TEE);  Surgeon: Hilarie Lovely, MD;  Location: [facility] OR;  Service: Open Heart Surgery;  Laterality: N/A;   TYMPANOSTOMY TUBE PLACEMENT     when I was a kid   UPPER GASTROINTESTINAL ENDOSCOPY  Prior to Admission medications   Medication Sig Start Date End Date Taking? Authorizing Provider  atorvastatin  (LIPITOR ) 80 MG tablet Take 1 tablet (80 mg total) by mouth daily. 06/21/23  Yes Darlis Eisenmenger, MD  carvedilol  (COREG ) 6.25 MG tablet Take 1 tablet (6.25 mg total) by mouth 2 (two) times daily with a meal. 04/27/23  Yes Darlis Eisenmenger, MD  dapagliflozin  propanediol (FARXIGA ) 10 MG TABS tablet Take 1 tablet (10 mg  total) by mouth daily before breakfast. 05/27/23  Yes Darlis Eisenmenger, MD  digoxin  (LANOXIN ) 0.125 MG tablet Take 0.5 tablets (0.0625 mg total) by mouth daily. 04/30/23 04/29/24 Yes Darlis Eisenmenger, MD  furosemide  (LASIX ) 20 MG tablet Take 1 tablet (20 mg total) by mouth daily. 06/21/23  Yes Darlis Eisenmenger, MD  linaclotide  (LINZESS ) 290 MCG CAPS capsule Take 1 capsule (290 mcg total) by mouth daily before breakfast. 03/22/23  Yes Rai, Ripudeep K, MD  Omega-3 Fatty Acids (FISH OIL) 500 MG CAPS Take 2,000 mg by mouth daily.   Yes [provider]  ondansetron  (ZOFRAN -ODT) 4 MG disintegrating tablet Take 1 tablet (4 mg total) by mouth every 8 (eight) hours as needed for nausea or vomiting. 03/22/23  Yes Rai, Ripudeep K, MD  pantoprazole  (PROTONIX ) 40 MG tablet Take 1 tablet (40 mg total) by mouth 2 (two) times daily before a meal. Continue Protonix  40 mg twice a day for 8 weeks, then continue daily 03/22/23 07/20/23 Yes Rai, Ripudeep K, MD  polyethylene glycol (MIRALAX ) 17 g packet Take 17 g by mouth daily. 03/22/23  Yes Rai, Ripudeep K, MD  Probiotic Product (PROBIOTIC ADVANCED PO) Take 1 tablet by mouth daily.   Yes [provider]  sacubitril -valsartan  (ENTRESTO ) 24-26 MG Take 1 tablet by mouth 2 (two) times daily. 05/12/23  Yes Darlis Eisenmenger, MD  sodium zirconium cyclosilicate  (LOKELMA ) 5 g packet Take 5 g by mouth daily. 02/17/23  Yes Darlis Eisenmenger, MD  spironolactone  (ALDACTONE ) 25 MG tablet Take 1 tablet (25 mg total) by mouth daily. 06/21/23  Yes Darlis Eisenmenger, MD  apixaban  (ELIQUIS ) 5 MG TABS tablet Take 1 tablet (5 mg total) by mouth 2 (two) times daily. 05/12/23   Darlis Eisenmenger, MD  nitroGLYCERIN  (NITROSTAT ) 0.4 MG SL tablet Place 1 tablet (0.4 mg total) under the tongue every 5 (five) minutes x 3 doses as needed for chest pain. 01/27/22   Darlis Eisenmenger, MD    Current Facility-Administered Medications  Medication Dose Route Frequency Provider Last Rate Last Admin    0.9 %  sodium chloride  infusion   Intravenous Continuous Haedyn Ancrum C, MD       acetaminophen  (OFIRMEV ) IV 1,000 mg  1,000 mg Intravenous Once PRN Lethaniel Rave, MD       droperidol  (INAPSINE ) 2.5 MG/ML injection 0.625 mg  0.625 mg Intravenous Once PRN Lethaniel Rave, MD       fentaNYL  (SUBLIMAZE ) injection 25-50 mcg  25-50 mcg Intravenous Q5 min PRN Lethaniel Rave, MD       oxyCODONE  (Oxy IR/ROXICODONE ) immediate release tablet 5 mg  5 mg Oral Once PRN Lethaniel Rave, MD       Or   oxyCODONE  (ROXICODONE ) 5 MG/5ML solution 5 mg  5 mg Oral Once PRN Lethaniel Rave, MD        Allergies as of 03/23/2023 - Review Complete 03/19/2023  Allergen Reaction Noted   Codeine Itching 12/27/2007    Family History  Problem Relation Age  of Onset   Colon cancer Father 18   Colon cancer Paternal Grandfather    Colon cancer Paternal Aunt    Hypertension Neg Hx    Diabetes Neg Hx    Coronary artery disease Neg Hx    Esophageal cancer Neg Hx    Rectal cancer Neg Hx    Stomach cancer Neg Hx     Social History   Socioeconomic History   Marital status: Single    Spouse name: Not on file   Number of children: Not on file   Years of education: Not on file   Highest education level: High school graduate  Occupational History   Occupation: Not actively working    Comment: applied for disability x 2.   Tobacco Use   Smoking status: Former    Current packs/day: 0.00    Average packs/day: 1 pack/day for 22.0 years (22.0 ttl pk-yrs)    Types: Cigarettes, Cigars    Start date: 06/29/1998    Quit date: 06/28/2020    Years since quitting: 2.9   Smokeless tobacco: Never   Tobacco comments:    cigarettes stopped in 2020, swisher sweets started in 2018-04-05/day  Vaping Use   Vaping status: Never Used  Substance and Sexual Activity   Alcohol use: Never   Drug use: Not Currently    Types: Marijuana   Sexual activity: Never  Other Topics Concern   Not on file  Social History Narrative   Single   Lives with  his parents   Trying to get his GED   DeGent date all cultures   Social Drivers of Health   Financial Resource Strain: Medium Risk (12/04/2020)   Overall Financial Resource Strain (CARDIA)    Difficulty of Paying Living Expenses: Somewhat hard  Food Insecurity: No Food Insecurity (03/19/2023)   Hunger Vital Sign    Worried About Running Out of Food in the Last Year: Never true    Ran Out of Food in the Last Year: Never true  Transportation Needs: No Transportation Needs (03/19/2023)   PRAPARE - Administrator, Civil Service (Medical): No    Lack of Transportation (Non-Medical): No  Physical Activity: Not on file  Stress: Not on file  Social Connections: Not on file  Intimate Partner Violence: Not At Risk (03/19/2023)   Humiliation, Afraid, Rape, and Kick questionnaire    Fear of Current or Ex-Partner: No    Emotionally Abused: No    Physically Abused: No    Sexually Abused: No    Physical Exam: Vital signs in last 24 hours: BP (!) 150/98   Pulse 80   Temp 97.9 F (36.6 C) (Oral)   Resp 12   Ht 6\' 1"  (1.854 m)   Wt 95.3 kg   SpO2 99%   BMI 27.71 kg/m  GEN: NAD EYE: Sclerae anicteric ENT: MMM CV: Non-tachycardic Pulm: No increased work of breathing GI: Soft, NT/ND NEURO:  Alert & Oriented   Regino Caprio, MD [facility]  06/24/2023 7:25 AM

## 2023-06-24 NOTE — Anesthesia Postprocedure Evaluation (Signed)
 Anesthesia Post Note  Patient: Wyatt Donovan  Procedure(s) Performed: ESOPHAGOGASTRODUODENOSCOPY (EGD) WITH PROPOFOL      Patient location during evaluation: PACU Anesthesia Type: MAC Level of consciousness: awake and alert Pain management: pain level controlled Vital Signs Assessment: post-procedure vital signs reviewed and stable Respiratory status: spontaneous breathing, nonlabored ventilation, respiratory function stable and patient connected to nasal cannula oxygen Cardiovascular status: stable and blood pressure returned to baseline Postop Assessment: no apparent nausea or vomiting Anesthetic complications: no   No notable events documented.  Last Vitals:  Vitals:   06/24/23 0825 06/24/23 0835  BP: 94/64 101/71  Pulse: 67 66  Resp: 15 14  Temp:    SpO2: 93% 93%    Last Pain:  Vitals:   06/24/23 0835  TempSrc:   PainSc: 0-No pain                 Lethaniel Rave

## 2023-06-25 ENCOUNTER — Encounter: Payer: Self-pay | Admitting: Internal Medicine

## 2023-06-25 LAB — SURGICAL PATHOLOGY

## 2023-06-27 ENCOUNTER — Encounter (HOSPITAL_COMMUNITY): Payer: Self-pay | Admitting: Internal Medicine

## 2023-06-28 NOTE — Progress Notes (Signed)
 Remote ICD transmission.

## 2023-07-07 ENCOUNTER — Ambulatory Visit

## 2023-07-22 ENCOUNTER — Encounter

## 2023-07-27 ENCOUNTER — Ambulatory Visit (HOSPITAL_COMMUNITY): Payer: Self-pay | Admitting: Cardiology

## 2023-07-27 ENCOUNTER — Ambulatory Visit: Attending: Family Medicine

## 2023-07-27 ENCOUNTER — Ambulatory Visit (HOSPITAL_COMMUNITY): Payer: Self-pay | Admitting: Family Medicine

## 2023-07-27 ENCOUNTER — Ambulatory Visit (INDEPENDENT_AMBULATORY_CARE_PROVIDER_SITE_OTHER)

## 2023-07-27 DIAGNOSIS — I5022 Chronic systolic (congestive) heart failure: Secondary | ICD-10-CM

## 2023-07-27 DIAGNOSIS — I739 Peripheral vascular disease, unspecified: Secondary | ICD-10-CM | POA: Insufficient documentation

## 2023-07-27 LAB — VAS US ABI WITH/WO TBI
Left ABI: 0.96
Right ABI: 1.18

## 2023-08-10 ENCOUNTER — Ambulatory Visit (INDEPENDENT_AMBULATORY_CARE_PROVIDER_SITE_OTHER): Payer: Medicaid Other

## 2023-08-10 DIAGNOSIS — I255 Ischemic cardiomyopathy: Secondary | ICD-10-CM

## 2023-08-10 LAB — CUP PACEART REMOTE DEVICE CHECK
Battery Remaining Longevity: 138 mo
Battery Remaining Percentage: 100 %
Brady Statistic RA Percent Paced: 0 %
Brady Statistic RV Percent Paced: 0 %
Date Time Interrogation Session: 20250610050100
HighPow Impedance: 68 Ohm
Implantable Lead Connection Status: 753985
Implantable Lead Connection Status: 753985
Implantable Lead Implant Date: 20221213
Implantable Lead Implant Date: 20221213
Implantable Lead Location: 753859
Implantable Lead Location: 753860
Implantable Lead Model: 673
Implantable Lead Model: 7841
Implantable Lead Serial Number: 1211072
Implantable Lead Serial Number: 160080
Implantable Pulse Generator Implant Date: 20221213
Lead Channel Impedance Value: 441 Ohm
Lead Channel Impedance Value: 513 Ohm
Lead Channel Setting Pacing Amplitude: 2 V
Lead Channel Setting Pacing Amplitude: 2.5 V
Lead Channel Setting Pacing Pulse Width: 0.4 ms
Lead Channel Setting Sensing Sensitivity: 0.5 mV
Pulse Gen Serial Number: 617464
Zone Setting Status: 755011

## 2023-08-14 ENCOUNTER — Ambulatory Visit: Payer: Self-pay | Admitting: Cardiology

## 2023-10-12 NOTE — Progress Notes (Signed)
 Remote ICD transmission.

## 2023-10-28 ENCOUNTER — Telehealth (HOSPITAL_COMMUNITY): Payer: Self-pay | Admitting: Cardiology

## 2023-11-08 ENCOUNTER — Encounter (HOSPITAL_COMMUNITY): Admitting: Cardiology

## 2023-11-09 ENCOUNTER — Ambulatory Visit: Payer: Medicaid Other

## 2023-11-09 DIAGNOSIS — I255 Ischemic cardiomyopathy: Secondary | ICD-10-CM

## 2023-11-10 LAB — CUP PACEART REMOTE DEVICE CHECK
Battery Remaining Longevity: 138 mo
Battery Remaining Percentage: 100 %
Brady Statistic RA Percent Paced: 0 %
Brady Statistic RV Percent Paced: 0 %
Date Time Interrogation Session: 20250909051400
HighPow Impedance: 81 Ohm
Implantable Lead Connection Status: 753985
Implantable Lead Connection Status: 753985
Implantable Lead Implant Date: 20221213
Implantable Lead Implant Date: 20221213
Implantable Lead Location: 753859
Implantable Lead Location: 753860
Implantable Lead Model: 673
Implantable Lead Model: 7841
Implantable Lead Serial Number: 1211072
Implantable Lead Serial Number: 160080
Implantable Pulse Generator Implant Date: 20221213
Lead Channel Impedance Value: 459 Ohm
Lead Channel Impedance Value: 533 Ohm
Lead Channel Setting Pacing Amplitude: 2 V
Lead Channel Setting Pacing Amplitude: 2.5 V
Lead Channel Setting Pacing Pulse Width: 0.4 ms
Lead Channel Setting Sensing Sensitivity: 0.5 mV
Pulse Gen Serial Number: 617464
Zone Setting Status: 755011

## 2023-11-14 ENCOUNTER — Ambulatory Visit: Payer: Self-pay | Admitting: Cardiology

## 2023-11-18 NOTE — Progress Notes (Signed)
Remote ICD Transmission.

## 2023-12-07 ENCOUNTER — Encounter (HOSPITAL_COMMUNITY): Admitting: Cardiology

## 2023-12-20 ENCOUNTER — Other Ambulatory Visit: Payer: Self-pay | Admitting: Internal Medicine

## 2024-01-19 ENCOUNTER — Other Ambulatory Visit (HOSPITAL_COMMUNITY): Payer: Self-pay | Admitting: Cardiology

## 2024-01-30 ENCOUNTER — Other Ambulatory Visit (HOSPITAL_COMMUNITY): Payer: Self-pay | Admitting: Cardiology

## 2024-01-31 ENCOUNTER — Other Ambulatory Visit (HOSPITAL_COMMUNITY): Payer: Self-pay | Admitting: *Deleted

## 2024-01-31 ENCOUNTER — Other Ambulatory Visit (HOSPITAL_COMMUNITY): Payer: Self-pay | Admitting: Cardiology

## 2024-01-31 ENCOUNTER — Ambulatory Visit (HOSPITAL_COMMUNITY): Payer: Self-pay | Admitting: Cardiology

## 2024-01-31 ENCOUNTER — Ambulatory Visit (HOSPITAL_COMMUNITY)
Admission: RE | Admit: 2024-01-31 | Discharge: 2024-01-31 | Disposition: A | Source: Ambulatory Visit | Attending: Cardiology | Admitting: Cardiology

## 2024-01-31 ENCOUNTER — Encounter (HOSPITAL_COMMUNITY): Payer: Self-pay | Admitting: Cardiology

## 2024-01-31 VITALS — BP 100/60 | HR 85 | Wt 223.8 lb

## 2024-01-31 DIAGNOSIS — I255 Ischemic cardiomyopathy: Secondary | ICD-10-CM | POA: Insufficient documentation

## 2024-01-31 DIAGNOSIS — E875 Hyperkalemia: Secondary | ICD-10-CM | POA: Diagnosis not present

## 2024-01-31 DIAGNOSIS — I4892 Unspecified atrial flutter: Secondary | ICD-10-CM | POA: Diagnosis not present

## 2024-01-31 DIAGNOSIS — Z87891 Personal history of nicotine dependence: Secondary | ICD-10-CM | POA: Diagnosis not present

## 2024-01-31 DIAGNOSIS — Z7901 Long term (current) use of anticoagulants: Secondary | ICD-10-CM | POA: Diagnosis not present

## 2024-01-31 DIAGNOSIS — I5022 Chronic systolic (congestive) heart failure: Secondary | ICD-10-CM | POA: Diagnosis not present

## 2024-01-31 DIAGNOSIS — I25119 Atherosclerotic heart disease of native coronary artery with unspecified angina pectoris: Secondary | ICD-10-CM | POA: Diagnosis not present

## 2024-01-31 DIAGNOSIS — Z955 Presence of coronary angioplasty implant and graft: Secondary | ICD-10-CM | POA: Insufficient documentation

## 2024-01-31 DIAGNOSIS — K649 Unspecified hemorrhoids: Secondary | ICD-10-CM | POA: Insufficient documentation

## 2024-01-31 DIAGNOSIS — Z951 Presence of aortocoronary bypass graft: Secondary | ICD-10-CM | POA: Insufficient documentation

## 2024-01-31 DIAGNOSIS — Z7984 Long term (current) use of oral hypoglycemic drugs: Secondary | ICD-10-CM | POA: Diagnosis not present

## 2024-01-31 DIAGNOSIS — I480 Paroxysmal atrial fibrillation: Secondary | ICD-10-CM | POA: Diagnosis not present

## 2024-01-31 DIAGNOSIS — I252 Old myocardial infarction: Secondary | ICD-10-CM | POA: Insufficient documentation

## 2024-01-31 DIAGNOSIS — I493 Ventricular premature depolarization: Secondary | ICD-10-CM

## 2024-01-31 DIAGNOSIS — Z79899 Other long term (current) drug therapy: Secondary | ICD-10-CM | POA: Insufficient documentation

## 2024-01-31 DIAGNOSIS — K625 Hemorrhage of anus and rectum: Secondary | ICD-10-CM | POA: Diagnosis not present

## 2024-01-31 LAB — LIPID PANEL
Cholesterol: 108 mg/dL (ref 0–200)
HDL: 29 mg/dL — ABNORMAL LOW (ref >40)
LDL Cholesterol: 48 mg/dL (ref 0–99)
Total CHOL/HDL Ratio: 3.7 ratio
Triglycerides: 155 mg/dL — ABNORMAL HIGH (ref <150)
VLDL: 31 mg/dL (ref 0–40)

## 2024-01-31 LAB — BASIC METABOLIC PANEL WITH GFR
Anion gap: 12 (ref 5–15)
BUN: 24 mg/dL — ABNORMAL HIGH (ref 6–20)
CO2: 19 mmol/L — ABNORMAL LOW (ref 22–32)
Calcium: 9.5 mg/dL (ref 8.9–10.3)
Chloride: 113 mmol/L — ABNORMAL HIGH (ref 98–111)
Creatinine, Ser: 1.06 mg/dL (ref 0.61–1.24)
GFR, Estimated: >60 mL/min (ref >60)
Glucose, Bld: 101 mg/dL — ABNORMAL HIGH (ref 70–99)
Potassium: 4.2 mmol/L (ref 3.5–5.1)
Sodium: 144 mmol/L (ref 135–145)

## 2024-01-31 LAB — CBC
HCT: 46.2 % (ref 39.0–52.0)
Hemoglobin: 14.7 g/dL (ref 13.0–17.0)
MCH: 29.8 pg (ref 26.0–34.0)
MCHC: 31.8 g/dL (ref 30.0–36.0)
MCV: 93.5 fL (ref 80.0–100.0)
Platelets: 204 K/uL (ref 150–400)
RBC: 4.94 MIL/uL (ref 4.22–5.81)
RDW: 15.2 % (ref 11.5–15.5)
WBC: 7 K/uL (ref 4.0–10.5)
nRBC: 0 % (ref 0.0–0.2)

## 2024-01-31 LAB — IRON AND TIBC
Iron: 101 ug/dL (ref 45–182)
Saturation Ratios: 24 % (ref 17.9–39.5)
TIBC: 421 ug/dL (ref 250–450)
UIBC: 320 ug/dL

## 2024-01-31 LAB — DIGOXIN LEVEL: Digoxin Level: <0.6 ng/mL — ABNORMAL LOW (ref 0.8–2.0)

## 2024-01-31 LAB — BRAIN NATRIURETIC PEPTIDE: B Natriuretic Peptide: 392.9 pg/mL — ABNORMAL HIGH (ref 0.0–100.0)

## 2024-01-31 LAB — FERRITIN: Ferritin: 14 ng/mL — ABNORMAL LOW (ref 24–336)

## 2024-01-31 NOTE — Progress Notes (Signed)
 ReDS Vest / Clip - 01/31/24 0900       ReDS Vest / Clip   Station Marker D    Ruler Value 34.5    ReDS Value Range Low volume    ReDS Actual Value 25

## 2024-01-31 NOTE — H&P (View-Only) (Signed)
 Advanced Heart Failure Clinic Note   Primary Care: Orpha Yancey LABOR, MD HF Cardiologist: Dr. Rolan  Chief complaint: CHF  HPI: Patient is a 38 y.o. with history of early onset CAD s/p CABG, ischemic cardiomyopathy, and LV thrombus. Patient had initial MI in 2012 at age 83, PCI to LAD.  He had inferoposterior MI in 4/22.  LHC showed 3 vessel disease, and patient had CABG x 4. Cardiac MRI in 5/22 showed LV EF 27% with LV thrombus, there was significant viability.  Post-op, patient had GI bleeding and anticoagulation was stopped.  He quit smoking after CABG.    Follow up 10/04/20 carvedilol  decreased due to dizziness and repeat echo arranged.  Echo 8/22 EF 25-30%. He was referred to EP for ICD consideration => [employer] ICD.   Admitted 12/03/20 with NSTEMI. Coronary angiogram showed patent LIMA to LAD with occlusion of all other grafts. S/p PCI/DES to native mid LCX 99% stenosis.  Native LAD and RCA occluded. Angina resolved post PCI. Farxiga  and Coreg  added back, however spiro and losartan  held due to hyperkalemia during this admission. His hospitalization was complicated by atrial fibrillation and flutter with RVR. He received IV amiodarone  and started on Eliquis .  He underwent DCCV with conversion to NSR on 12/06/20. Discharged home 12/06/20, weight 177.5 lbs.  In 2/24, patient had cardiac contracility modulator placement.   Patient presented to [facility] in 12/24 with 2-3 months of RUQ pain, worse x 2-3 weeks.  Abdominal CT showed a stone in the cystic duct with mildly distended gallbladder but no evidence for cholecystitis.  He was discharged from the ER but told that he will need cholecystectomy.   Echo 01/2023 showed EF 30-35%, global hypokinesis, mild RV dysfunction, IVC not dilated.   Underwent cath 01/2023 showing normal filling pressures, low cardiac output with CI 2 by Fick,1.82 thermodilution. Coronary anatomy was unchanged.  Grafts all occluded (known prior)  except LIMA-LAD.  Occluded native RCA (known from prior).  Patent mid LCx stent. Post cath he was started on digoxin  0.125 mg.   He was admitted in 1/25 with RUL PNA and treated with antibiotics.  He continued to have abdominal pain, but this time abdominal US  showed no gallstones and HIDA scan was negative.   Today he returns for HF follow up. Weight is up 10 lbs.  He says that he has been feeling worse for about 3 months. ECG today shows bigeminal PVCs.  Rare atypical chest pain, none exertional.  He is short short of breath walking around his house and walking into the office today.  Dyspneic with most moderate actvity.  No orthopnea/PND.  Legs give out easily.  Peripheral arterial dopplers in 5/25 showed only mild bilateral plaque.  Patient has also noted rectal bleeding recently, sometimes quite profuse.  He has a history of hemorrhoids.  He also gets GERD symptoms after eating.   REDS clip 25%  ECG (personally reviewed): NSR with CCM artifact, frequent PVCs, 1st degree AVB, QRS 132 msec  [employer] ICD (personally reviewed): HeartLogic 0  Labs (12/24): K 5.7, creatinine 1.04, hgb 15.8, HS-TnI 32 Labs (03/02/23): K 4.9, creatinine 1 Labs (1/25): K 4, creatinine 0.79 Labs (2/25): K 4.1, creatinine 0.76, LDL 66 Labs (10/25): K 4.5, creatinine 1.12  PMH: 1. Hyperlipidemia 2. CAD: MI at age 62 in 2012, PCI to LAD.   - Inferoposterior MI in 4/22: LHC showed 3 vessel disease.  CABG with LIMA-LAD, radial to OM1, sequential SVG-PDA and D1.  -  NSTEMI-->LHC (10/22): patent LIMA to LAD, all other grafts occluded. S/p PCI DES native mid LCx 99% stenosis, LAD and RCA totally occluded.  - LHC (12/24): All SVGs occluded, LIMA-LAD patent, occluded native RCA, patent mid LCX stent.  3. Post-operative GI bleeding after CABG 4. Chronic systolic CHF: Ischemic cardiomyopathy.   - Cardiac MRI (5/22): LV EF 27%, RV EF 57%, apical thrombus, extensive viability.  - Echo (8/22) EF 20-25% - Echo  (10/22): EF 35-40% - cMRI (10/22): LVEF 29%, RV EF 45%, no LV thrombus, LGE suggestive of prior MI.  - [employer] ICD  - Cardiac contractility modulator implantation in 2/24.  - Echo (12/24): EF 30-35%, global hypokinesis, mild RV dysfunction, IVC not dilated. - RHC (12/24): mean RA 6, PA 28/13, mean PCWP 13, CI 1.82 thermo/2.0 Fick 5. LV thrombus  - No thrombus on 10/22 cMRI.  6. Prior smoker: Quit 5/22.  7. H/o hyperkalemia 8. Atrial fibrillation/flutter: Paroxysmal.  - DCCV 10/22 9. ABIs (5/22): Normal.  10. Colonic polyps 11. Symptomatic cholelithiasis 12. PAD: Peripheral arterial dopplers (5/25) showed only mild bilateral plaque.  13. Barrett's esophagus  Current Outpatient Medications  Medication Sig Dispense Refill   apixaban  (ELIQUIS ) 5 MG TABS tablet Take 1 tablet (5 mg total) by mouth 2 (two) times daily. 60 tablet 6   atorvastatin  (LIPITOR ) 80 MG tablet TAKE 1 TABLET BY MOUTH DAILY 30 tablet 6   dapagliflozin  propanediol (FARXIGA ) 10 MG TABS tablet Take 1 tablet (10 mg total) by mouth daily before breakfast. 30 tablet 11   digoxin  (LANOXIN ) 0.125 MG tablet Take 0.5 tablets (0.0625 mg total) by mouth daily. 15 tablet 11   furosemide  (LASIX ) 20 MG tablet TAKE 1 TABLET BY MOUTH DAILY 30 tablet 6   nitroGLYCERIN  (NITROSTAT ) 0.4 MG SL tablet Place 1 tablet (0.4 mg total) under the tongue every 5 (five) minutes x 3 doses as needed for chest pain. 15 tablet 3   Omega-3 Fatty Acids (OMEGA 3 500 PO) Take 1,000 mg by mouth daily.     ondansetron  (ZOFRAN -ODT) 4 MG disintegrating tablet Take 1 tablet (4 mg total) by mouth every 8 (eight) hours as needed for nausea or vomiting. 20 tablet 0   pantoprazole  (PROTONIX ) 40 MG tablet TAKE 1 TABLET BY MOUTH TWICE DAILY BEFORE MEALS 60 tablet 3   polyethylene glycol (MIRALAX  / GLYCOLAX ) 17 g packet Take 17 g by mouth as needed.     sacubitril -valsartan  (ENTRESTO ) 24-26 MG Take 1 tablet by mouth 2 (two) times daily. 60 tablet 6   sodium  zirconium cyclosilicate (LOKELMA ) 5 g packet Take 5 g by mouth daily. 30 packet 6   spironolactone  (ALDACTONE ) 25 MG tablet Take 1 tablet (25 mg total) by mouth daily. 30 tablet 6   carvedilol  (COREG ) 6.25 MG tablet TAKE 1 TABLET BY MOUTH TWICE DAILY WITH A MEAL 90 tablet 3   No current facility-administered medications for this encounter.   Allergies  Allergen Reactions   Codeine Itching   Social History   Socioeconomic History   Marital status: Single    Spouse name: Not on file   Number of children: Not on file   Years of education: Not on file   Highest education level: High school graduate  Occupational History   Occupation: Not actively working    Comment: applied for disability x 2.   Tobacco Use   Smoking status: Former    Current packs/day: 0.00    Average packs/day: 1 pack/day for 22.0 years (22.0 ttl pk-yrs)  Types: Cigarettes, Cigars    Start date: 06/29/1998    Quit date: 06/28/2020    Years since quitting: 3.5   Smokeless tobacco: Never   Tobacco comments:    cigarettes stopped in 2020, swisher sweets started in 2018-04-05/day  Vaping Use   Vaping status: Never Used  Substance and Sexual Activity   Alcohol use: Never   Drug use: Not Currently    Types: Marijuana   Sexual activity: Never  Other Topics Concern   Not on file  Social History Narrative   Single   Lives with his parents   Trying to get his GED   DeGent date all cultures   Social Drivers of Health   Financial Resource Strain: Medium Risk (12/04/2020)   Overall Financial Resource Strain (CARDIA)    Difficulty of Paying Living Expenses: Somewhat hard  Food Insecurity: No Food Insecurity (03/19/2023)   Hunger Vital Sign    Worried About Running Out of Food in the Last Year: Never true    Ran Out of Food in the Last Year: Never true  Transportation Needs: No Transportation Needs (03/19/2023)   PRAPARE - Administrator, Civil Service (Medical): No    Lack of Transportation  (Non-Medical): No  Physical Activity: Not on file  Stress: Not on file  Social Connections: Not on file  Intimate Partner Violence: Not At Risk (03/19/2023)   Humiliation, Afraid, Rape, and Kick questionnaire    Fear of Current or Ex-Partner: No    Emotionally Abused: No    Physically Abused: No    Sexually Abused: No   Family History  Problem Relation Age of Onset   Colon cancer Father 95   Colon cancer Paternal Grandfather    Colon cancer Paternal Aunt    Hypertension Neg Hx    Diabetes Neg Hx    Coronary artery disease Neg Hx    Esophageal cancer Neg Hx    Rectal cancer Neg Hx    Stomach cancer Neg Hx    BP 100/60   Pulse 85   Wt 101.5 kg (223 lb 12.8 oz)   SpO2 97%   BMI 29.53 kg/m   Wt Readings from Last 3 Encounters:  01/31/24 101.5 kg (223 lb 12.8 oz)  06/24/23 95.3 kg (210 lb)  06/08/23 97.4 kg (214 lb 12.8 oz)   PHYSICAL EXAM: General: NAD but chronically ill-appearing.  Neck: No JVD, no thyromegaly or thyroid  nodule.  Lungs: Clear to auscultation bilaterally with normal respiratory effort. CV: Nondisplaced PMI.  Heart regular S1/S2, no S3/S4, no murmur.  No peripheral edema.  No carotid bruit.  Normal pedal pulses.  Abdomen: Soft, nontender, no hepatosplenomegaly, no distention.  Skin: Intact without lesions or rashes.  Neurologic: Alert and oriented x 3.  Psych: Normal affect. Extremities: No clubbing or cyanosis.  HEENT: Normal.   ASSESSMENT & PLAN:  1. PVCs: Note to be frequent today on ECG (bigeminal).   - I will arrange for Zio monitor to quantify, this could lead to worsening of his cardiomyopathy.  - May need to resume amiodarone .  2. CAD: Early onset CAD s/p MI with LAD PCI in 2012,followed by CABG  x 4 4/22  Admit 10/22 with NSTEMI, cath showed patent LIMA to LAD, all other grafts occluded. S/p PCI DES native mid LCx 99% stenosis. Repeat 01/2023 LHC showed coronary anatomy unchanged; grafts all occluded (known prior) except LIMA-LAD.  Occluded  native RCA (known from prior) with patent mid LCx stent.  No exertional chest  pain.  - He is now off Plavix  (stopped 8/23 due to BRBPR). - He is on Eliquis  so no ASA.   - Continue atorvastatin  80 mg daily, check lipids today.  3. Chronic Systolic CHF: Ischemic cardiomyopathy.  [employer] ICD.  Cardiac MRI in 10/22 showed LVEF 29% and RVEF 45%.  Patient has a cardiac contractility modulator.  Echo 12/24 showed EF 30-35%, global hypokinesis, mild RV dysfunction, IVC not dilated.  GDMT has been limited by hypotension & hyperkalemia, K is now controlled with Lokelma .  Had RHC 12/24 showing normal filling pressures, low cardiac output with CI 2 by Fick, 1.82 by thermodilution.  Symptomatically worse, NYHA class III with increased dyspnea x 3 months.  He does not look volume overloaded by exam or HeartLogic, and REDS clip is low at 25%.  Symptoms are out of proportion to exam.  - I will arrange for echo, can do at [facility].  - With worsening symptoms, will arrange for RHC to assess filling pressures and cardiac output.  ?if this is a low output problem. We discussed risks/benefits and he agrees to procedure.    - Continue lasix  20 mg daily. BMET/BNP today - Continue digoxin  0.0625 mg daily, check level today.  - Continue Coreg  6.25 mg bid.  - Continue Farxiga  10 mg daily.  - Continue spironolactone  25 mg daily + Lokelma  5 g daily given history of hyperkalemia.  - Continue Entresto  24/26 bid.  He does not appear to have BP room to titrate.  4. 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  No thrombus on echo 08/22. No thrombus on cMRI 12/06/20.  No thrombus on most recent echo.   - Continue Eliquis  5 mg bid. CBC today.  5. Hyperkalemia: He has had a tendency towards hyperkalemia and has been on Lokelma  in order to stay on spironolactone  and Entresto .   - Continue Lokelma  5 g daily. BMET today. 6. Atrial flutter/fibrillation: Both arrhythmias noted in 10/22.  He tolerated poorly with worsened symptoms.  He had  DCCV in 10/22. Previously on amiodarone  but later stopped. He is in NSR today.  - Continue Eliquis .  - Would favor ablation if he has recurrent AF.  7. Rectal bleeding: Red blood in stool.  Has had before from hemorrhoids.   - CBC today.  - Refer back for evaluation by his GI MD.   Follow up in 1 month with APP.   I spent 41 minutes reviewing records, interviewing/examining patient, and managing orders.   Ezra Shuck  01/31/24

## 2024-01-31 NOTE — Patient Instructions (Signed)
 Medication Changes:  None, continue current medications  Lab Work:  Labs done today, your results will be available in MyChart, we will contact you for abnormal readings.  Testing/Procedures:  Your provider has recommended that  you wear a Zio Patch for 7 days.  This monitor will record your heart rhythm for our review.  IF you have any symptoms while wearing the monitor please press the button.  If you have any issues with the patch or you notice a red or orange light on it please call the company at [phone].  Once you remove the patch please mail it back to the company as soon as possible so we can get the results.   Heart Catheterization Fri 12/12, see instructions below  Your physician has requested that you have an echocardiogram. Echocardiography is a painless test that uses sound waves to create images of your heart. It provides your doctor with information about the size and shape of your heart and how well your heart's chambers and valves are working. This procedure takes approximately one hour. There are no restrictions for this procedure. Please do NOT wear cologne, perfume, aftershave, or lotions (deodorant is allowed). Please arrive 15 minutes prior to your appointment time. THIS WILL BE DONE AT [facility], THEY WILL CALL YOU TO SCHEDULE  Please note: We ask at that you not bring children with you during ultrasound (echo/ vascular) testing. Due to room size and safety concerns, children are not allowed in the ultrasound rooms during exams. Our front office staff cannot provide observation of children in our lobby area while testing is being conducted. An adult accompanying a patient to their appointment will only be allowed in the ultrasound room at the discretion of the ultrasound technician under special circumstances. We apologize for any inconvenience.  Referrals:  You have been referred to [facility], they will call you to schedule an appointment  Special  Instructions // Education:  Do the following things EVERYDAY: Weigh yourself in the morning before breakfast. Write it down and keep it in a log. Take your medicines as prescribed Eat low salt foods--Limit salt (sodium) to 2000 mg per day.  Stay as active as you can everyday Limit all fluids for the day to less than 2 liters   CARDIAC CATHETERIZATION:  You are scheduled for a Cardiac Catheterization on Friday, December 12 with Dr. Ezra Shuck.  1. Please arrive at the [facility] (Main Entrance A) at [facility]: [address] [place], [place] [address] at 5:30 AM (This time is 2 hour(s) before your procedure to ensure your preparation).   Free valet parking service is available. You will check in at ADMITTING. The support person will be asked to wait in the waiting room.  It is OK to have someone drop you off and come back when you are ready to be discharged.    Special note: Every effort is made to have your procedure done on time. Please understand that emergencies sometimes delay scheduled procedures.  2. Diet: Nothing to eat after midnight.   3. Hydration: On December 12, you may drink approved liquids (see below) until 2 hours before the procedure with 8 oz of water as your last intake.   List of approved liquids water, clear juice, clear tea, black coffee, fruit juices, non-citric and without pulp, carbonated beverages, Gatorade, Kool -Aid, plain Jello-O and plain ice popsicles.  4. Labs: DONE TODAY  5. Medication instructions in preparation for your procedure:   [facility] 12/11  DO NOT TAKE ELIQUIS   FRIDAY 12/12 AM DO NOT TAKE: Eliquis , Furosemide , Spironolactone , or Farxiga   On the morning of your procedure, take any morning medicines NOT listed above.  You may use sips of water.  6. Plan to go home the same day, you will only stay overnight if medically necessary. 7. Bring a current list of your medications and current insurance cards. 8. You MUST have a  responsible person to drive you home. 9. Someone MUST be with you the first 24 hours after you arrive home or your discharge will be delayed. 10. Please wear clothes that are easy to get on and off and wear slip-on shoes.  Thank you for allowing us  to care for you!   -- [facility]   Follow-Up in: 1 month   At the [facility], you and your health needs are our priority. We have a designated team specialized in the treatment of Heart Failure. This Care Team includes your primary Heart Failure Specialized Cardiologist (physician), Advanced Practice Providers (APPs- Physician Assistants and Nurse Practitioners), and Pharmacist who all work together to provide you with the care you need, when you need it.   You may see any of the following providers on your designated Care Team at your next follow up:  Dr. Toribio Fuel Dr. Ezra Shuck Dr. Odis Brownie Greig Mosses, NP Caffie Shed, [place] [facility] [place], [place] Beckey Coe, NP Jordan Lee, NP Tinnie Redman, PharmD   Please be sure to bring in all your medications bottles to every appointment.   Need to Contact Us :  If you have any questions or concerns before your next appointment please send us  a message through [facility] or call our office at [phone].    TO LEAVE A MESSAGE FOR THE NURSE SELECT OPTION 2, PLEASE LEAVE A MESSAGE INCLUDING: YOUR NAME DATE OF BIRTH CALL BACK NUMBER REASON FOR CALL**this is important as we prioritize the call backs  YOU WILL RECEIVE A CALL BACK THE SAME DAY AS LONG AS YOU CALL BEFORE 4:00 PM

## 2024-01-31 NOTE — Progress Notes (Signed)
 Advanced Heart Failure Clinic Note   Primary Care: Orpha Yancey LABOR, MD HF Cardiologist: Dr. Rolan  Chief complaint: CHF  HPI: Patient is a 38 y.o. with history of early onset CAD s/p CABG, ischemic cardiomyopathy, and LV thrombus. Patient had initial MI in 2012 at age 83, PCI to LAD.  He had inferoposterior MI in 4/22.  LHC showed 3 vessel disease, and patient had CABG x 4. Cardiac MRI in 5/22 showed LV EF 27% with LV thrombus, there was significant viability.  Post-op, patient had GI bleeding and anticoagulation was stopped.  He quit smoking after CABG.    Follow up 10/04/20 carvedilol  decreased due to dizziness and repeat echo arranged.  Echo 8/22 EF 25-30%. He was referred to EP for ICD consideration => [employer] ICD.   Admitted 12/03/20 with NSTEMI. Coronary angiogram showed patent LIMA to LAD with occlusion of all other grafts. S/p PCI/DES to native mid LCX 99% stenosis.  Native LAD and RCA occluded. Angina resolved post PCI. Farxiga  and Coreg  added back, however spiro and losartan  held due to hyperkalemia during this admission. His hospitalization was complicated by atrial fibrillation and flutter with RVR. He received IV amiodarone  and started on Eliquis .  He underwent DCCV with conversion to NSR on 12/06/20. Discharged home 12/06/20, weight 177.5 lbs.  In 2/24, patient had cardiac contracility modulator placement.   Patient presented to [facility] in 12/24 with 2-3 months of RUQ pain, worse x 2-3 weeks.  Abdominal CT showed a stone in the cystic duct with mildly distended gallbladder but no evidence for cholecystitis.  He was discharged from the ER but told that he will need cholecystectomy.   Echo 01/2023 showed EF 30-35%, global hypokinesis, mild RV dysfunction, IVC not dilated.   Underwent cath 01/2023 showing normal filling pressures, low cardiac output with CI 2 by Fick,1.82 thermodilution. Coronary anatomy was unchanged.  Grafts all occluded (known prior)  except LIMA-LAD.  Occluded native RCA (known from prior).  Patent mid LCx stent. Post cath he was started on digoxin  0.125 mg.   He was admitted in 1/25 with RUL PNA and treated with antibiotics.  He continued to have abdominal pain, but this time abdominal US  showed no gallstones and HIDA scan was negative.   Today he returns for HF follow up. Weight is up 10 lbs.  He says that he has been feeling worse for about 3 months. ECG today shows bigeminal PVCs.  Rare atypical chest pain, none exertional.  He is short short of breath walking around his house and walking into the office today.  Dyspneic with most moderate actvity.  No orthopnea/PND.  Legs give out easily.  Peripheral arterial dopplers in 5/25 showed only mild bilateral plaque.  Patient has also noted rectal bleeding recently, sometimes quite profuse.  He has a history of hemorrhoids.  He also gets GERD symptoms after eating.   REDS clip 25%  ECG (personally reviewed): NSR with CCM artifact, frequent PVCs, 1st degree AVB, QRS 132 msec  [employer] ICD (personally reviewed): HeartLogic 0  Labs (12/24): K 5.7, creatinine 1.04, hgb 15.8, HS-TnI 32 Labs (03/02/23): K 4.9, creatinine 1 Labs (1/25): K 4, creatinine 0.79 Labs (2/25): K 4.1, creatinine 0.76, LDL 66 Labs (10/25): K 4.5, creatinine 1.12  PMH: 1. Hyperlipidemia 2. CAD: MI at age 62 in 2012, PCI to LAD.   - Inferoposterior MI in 4/22: LHC showed 3 vessel disease.  CABG with LIMA-LAD, radial to OM1, sequential SVG-PDA and D1.  -  NSTEMI-->LHC (10/22): patent LIMA to LAD, all other grafts occluded. S/p PCI DES native mid LCx 99% stenosis, LAD and RCA totally occluded.  - LHC (12/24): All SVGs occluded, LIMA-LAD patent, occluded native RCA, patent mid LCX stent.  3. Post-operative GI bleeding after CABG 4. Chronic systolic CHF: Ischemic cardiomyopathy.   - Cardiac MRI (5/22): LV EF 27%, RV EF 57%, apical thrombus, extensive viability.  - Echo (8/22) EF 20-25% - Echo  (10/22): EF 35-40% - cMRI (10/22): LVEF 29%, RV EF 45%, no LV thrombus, LGE suggestive of prior MI.  - [employer] ICD  - Cardiac contractility modulator implantation in 2/24.  - Echo (12/24): EF 30-35%, global hypokinesis, mild RV dysfunction, IVC not dilated. - RHC (12/24): mean RA 6, PA 28/13, mean PCWP 13, CI 1.82 thermo/2.0 Fick 5. LV thrombus  - No thrombus on 10/22 cMRI.  6. Prior smoker: Quit 5/22.  7. H/o hyperkalemia 8. Atrial fibrillation/flutter: Paroxysmal.  - DCCV 10/22 9. ABIs (5/22): Normal.  10. Colonic polyps 11. Symptomatic cholelithiasis 12. PAD: Peripheral arterial dopplers (5/25) showed only mild bilateral plaque.  13. Barrett's esophagus  Current Outpatient Medications  Medication Sig Dispense Refill   apixaban  (ELIQUIS ) 5 MG TABS tablet Take 1 tablet (5 mg total) by mouth 2 (two) times daily. 60 tablet 6   atorvastatin  (LIPITOR ) 80 MG tablet TAKE 1 TABLET BY MOUTH DAILY 30 tablet 6   dapagliflozin  propanediol (FARXIGA ) 10 MG TABS tablet Take 1 tablet (10 mg total) by mouth daily before breakfast. 30 tablet 11   digoxin  (LANOXIN ) 0.125 MG tablet Take 0.5 tablets (0.0625 mg total) by mouth daily. 15 tablet 11   furosemide  (LASIX ) 20 MG tablet TAKE 1 TABLET BY MOUTH DAILY 30 tablet 6   nitroGLYCERIN  (NITROSTAT ) 0.4 MG SL tablet Place 1 tablet (0.4 mg total) under the tongue every 5 (five) minutes x 3 doses as needed for chest pain. 15 tablet 3   Omega-3 Fatty Acids (OMEGA 3 500 PO) Take 1,000 mg by mouth daily.     ondansetron  (ZOFRAN -ODT) 4 MG disintegrating tablet Take 1 tablet (4 mg total) by mouth every 8 (eight) hours as needed for nausea or vomiting. 20 tablet 0   pantoprazole  (PROTONIX ) 40 MG tablet TAKE 1 TABLET BY MOUTH TWICE DAILY BEFORE MEALS 60 tablet 3   polyethylene glycol (MIRALAX  / GLYCOLAX ) 17 g packet Take 17 g by mouth as needed.     sacubitril -valsartan  (ENTRESTO ) 24-26 MG Take 1 tablet by mouth 2 (two) times daily. 60 tablet 6   sodium  zirconium cyclosilicate (LOKELMA ) 5 g packet Take 5 g by mouth daily. 30 packet 6   spironolactone  (ALDACTONE ) 25 MG tablet Take 1 tablet (25 mg total) by mouth daily. 30 tablet 6   carvedilol  (COREG ) 6.25 MG tablet TAKE 1 TABLET BY MOUTH TWICE DAILY WITH A MEAL 90 tablet 3   No current facility-administered medications for this encounter.   Allergies  Allergen Reactions   Codeine Itching   Social History   Socioeconomic History   Marital status: Single    Spouse name: Not on file   Number of children: Not on file   Years of education: Not on file   Highest education level: High school graduate  Occupational History   Occupation: Not actively working    Comment: applied for disability x 2.   Tobacco Use   Smoking status: Former    Current packs/day: 0.00    Average packs/day: 1 pack/day for 22.0 years (22.0 ttl pk-yrs)  Types: Cigarettes, Cigars    Start date: 06/29/1998    Quit date: 06/28/2020    Years since quitting: 3.5   Smokeless tobacco: Never   Tobacco comments:    cigarettes stopped in 2020, [employer] sweets started in 2018-04-05/day  Vaping Use   Vaping status: Never Used  Substance and Sexual Activity   Alcohol use: Never   Drug use: Not Currently    Types: Marijuana   Sexual activity: Never  Other Topics Concern   Not on file  Social History Narrative   Single   Lives with his parents   Trying to get his GED   DeGent date all cultures   Social Drivers of Health   Financial Resource Strain: Medium Risk (12/04/2020)   Overall Financial Resource Strain (CARDIA)    Difficulty of Paying Living Expenses: Somewhat hard  Food Insecurity: No Food Insecurity (03/19/2023)   Hunger Vital Sign    Worried About Running Out of Food in the Last Year: Never true    Ran Out of Food in the Last Year: Never true  Transportation Needs: No Transportation Needs (03/19/2023)   PRAPARE - Administrator, Civil Service (Medical): No    Lack of Transportation  (Non-Medical): No  Physical Activity: Not on file  Stress: Not on file  Social Connections: Not on file  Intimate Partner Violence: Not At Risk (03/19/2023)   Humiliation, Afraid, Rape, and Kick questionnaire    Fear of Current or Ex-Partner: No    Emotionally Abused: No    Physically Abused: No    Sexually Abused: No   Family History  Problem Relation Age of Onset   Colon cancer Father 95   Colon cancer Paternal Grandfather    Colon cancer Paternal Aunt    Hypertension Neg Hx    Diabetes Neg Hx    Coronary artery disease Neg Hx    Esophageal cancer Neg Hx    Rectal cancer Neg Hx    Stomach cancer Neg Hx    BP 100/60   Pulse 85   Wt 101.5 kg (223 lb 12.8 oz)   SpO2 97%   BMI 29.53 kg/m   Wt Readings from Last 3 Encounters:  01/31/24 101.5 kg (223 lb 12.8 oz)  06/24/23 95.3 kg (210 lb)  06/08/23 97.4 kg (214 lb 12.8 oz)   PHYSICAL EXAM: General: NAD but chronically ill-appearing.  Neck: No JVD, no thyromegaly or thyroid  nodule.  Lungs: Clear to auscultation bilaterally with normal respiratory effort. CV: Nondisplaced PMI.  Heart regular S1/S2, no S3/S4, no murmur.  No peripheral edema.  No carotid bruit.  Normal pedal pulses.  Abdomen: Soft, nontender, no hepatosplenomegaly, no distention.  Skin: Intact without lesions or rashes.  Neurologic: Alert and oriented x 3.  Psych: Normal affect. Extremities: No clubbing or cyanosis.  HEENT: Normal.   ASSESSMENT & PLAN:  1. PVCs: Note to be frequent today on ECG (bigeminal).   - I will arrange for Zio monitor to quantify, this could lead to worsening of his cardiomyopathy.  - May need to resume amiodarone .  2. CAD: Early onset CAD s/p MI with LAD PCI in 2012,followed by CABG  x 4 4/22  Admit 10/22 with NSTEMI, cath showed patent LIMA to LAD, all other grafts occluded. S/p PCI DES native mid LCx 99% stenosis. Repeat 01/2023 LHC showed coronary anatomy unchanged; grafts all occluded (known prior) except LIMA-LAD.  Occluded  native RCA (known from prior) with patent mid LCx stent.  No exertional chest  pain.  - He is now off Plavix  (stopped 8/23 due to BRBPR). - He is on Eliquis  so no ASA.   - Continue atorvastatin  80 mg daily, check lipids today.  3. Chronic Systolic CHF: Ischemic cardiomyopathy.  [employer] ICD.  Cardiac MRI in 10/22 showed LVEF 29% and RVEF 45%.  Patient has a cardiac contractility modulator.  Echo 12/24 showed EF 30-35%, global hypokinesis, mild RV dysfunction, IVC not dilated.  GDMT has been limited by hypotension & hyperkalemia, K is now controlled with Lokelma .  Had RHC 12/24 showing normal filling pressures, low cardiac output with CI 2 by Fick, 1.82 by thermodilution.  Symptomatically worse, NYHA class III with increased dyspnea x 3 months.  He does not look volume overloaded by exam or HeartLogic, and REDS clip is low at 25%.  Symptoms are out of proportion to exam.  - I will arrange for echo, can do at [facility].  - With worsening symptoms, will arrange for RHC to assess filling pressures and cardiac output.  ?if this is a low output problem. We discussed risks/benefits and he agrees to procedure.    - Continue lasix  20 mg daily. BMET/BNP today - Continue digoxin  0.0625 mg daily, check level today.  - Continue Coreg  6.25 mg bid.  - Continue Farxiga  10 mg daily.  - Continue spironolactone  25 mg daily + Lokelma  5 g daily given history of hyperkalemia.  - Continue Entresto  24/26 bid.  He does not appear to have BP room to titrate.  4. 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  No thrombus on echo 08/22. No thrombus on cMRI 12/06/20.  No thrombus on most recent echo.   - Continue Eliquis  5 mg bid. CBC today.  5. Hyperkalemia: He has had a tendency towards hyperkalemia and has been on Lokelma  in order to stay on spironolactone  and Entresto .   - Continue Lokelma  5 g daily. BMET today. 6. Atrial flutter/fibrillation: Both arrhythmias noted in 10/22.  He tolerated poorly with worsened symptoms.  He had  DCCV in 10/22. Previously on amiodarone  but later stopped. He is in NSR today.  - Continue Eliquis .  - Would favor ablation if he has recurrent AF.  7. Rectal bleeding: Red blood in stool.  Has had before from hemorrhoids.   - CBC today.  - Refer back for evaluation by his GI MD.   Follow up in 1 month with APP.   I spent 41 minutes reviewing records, interviewing/examining patient, and managing orders.   Ezra Shuck  01/31/24

## 2024-02-07 ENCOUNTER — Other Ambulatory Visit (HOSPITAL_COMMUNITY): Payer: Self-pay | Admitting: Cardiology

## 2024-02-08 ENCOUNTER — Ambulatory Visit

## 2024-02-09 LAB — CUP PACEART REMOTE DEVICE CHECK
Battery Remaining Longevity: 126 mo
Battery Remaining Percentage: 100 %
Brady Statistic RA Percent Paced: 0 %
Brady Statistic RV Percent Paced: 0 %
Date Time Interrogation Session: 20251209051500
HighPow Impedance: 72 Ohm
Implantable Lead Connection Status: 753985
Implantable Lead Connection Status: 753985
Implantable Lead Implant Date: 20221213
Implantable Lead Implant Date: 20221213
Implantable Lead Location: 753859
Implantable Lead Location: 753860
Implantable Lead Model: 673
Implantable Lead Model: 7841
Implantable Lead Serial Number: 1211072
Implantable Lead Serial Number: 160080
Implantable Pulse Generator Implant Date: 20221213
Lead Channel Impedance Value: 436 Ohm
Lead Channel Impedance Value: 502 Ohm
Lead Channel Setting Pacing Amplitude: 2 V
Lead Channel Setting Pacing Amplitude: 2.5 V
Lead Channel Setting Pacing Pulse Width: 0.4 ms
Lead Channel Setting Sensing Sensitivity: 0.5 mV
Pulse Gen Serial Number: 617464
Zone Setting Status: 755011

## 2024-02-11 ENCOUNTER — Other Ambulatory Visit: Payer: Self-pay

## 2024-02-11 ENCOUNTER — Ambulatory Visit (HOSPITAL_COMMUNITY)
Admission: RE | Admit: 2024-02-11 | Discharge: 2024-02-11 | Disposition: A | Attending: Cardiology | Admitting: Cardiology

## 2024-02-11 ENCOUNTER — Encounter (HOSPITAL_COMMUNITY): Admission: RE | Disposition: A | Payer: Self-pay | Attending: Cardiology

## 2024-02-11 DIAGNOSIS — Z87891 Personal history of nicotine dependence: Secondary | ICD-10-CM | POA: Diagnosis not present

## 2024-02-11 DIAGNOSIS — I480 Paroxysmal atrial fibrillation: Secondary | ICD-10-CM | POA: Insufficient documentation

## 2024-02-11 DIAGNOSIS — I110 Hypertensive heart disease with heart failure: Secondary | ICD-10-CM | POA: Insufficient documentation

## 2024-02-11 DIAGNOSIS — I509 Heart failure, unspecified: Secondary | ICD-10-CM

## 2024-02-11 DIAGNOSIS — E875 Hyperkalemia: Secondary | ICD-10-CM | POA: Diagnosis not present

## 2024-02-11 DIAGNOSIS — Z7901 Long term (current) use of anticoagulants: Secondary | ICD-10-CM | POA: Diagnosis not present

## 2024-02-11 DIAGNOSIS — I2510 Atherosclerotic heart disease of native coronary artery without angina pectoris: Secondary | ICD-10-CM | POA: Insufficient documentation

## 2024-02-11 DIAGNOSIS — Z955 Presence of coronary angioplasty implant and graft: Secondary | ICD-10-CM | POA: Insufficient documentation

## 2024-02-11 DIAGNOSIS — I252 Old myocardial infarction: Secondary | ICD-10-CM | POA: Insufficient documentation

## 2024-02-11 DIAGNOSIS — I255 Ischemic cardiomyopathy: Secondary | ICD-10-CM | POA: Diagnosis not present

## 2024-02-11 DIAGNOSIS — Z951 Presence of aortocoronary bypass graft: Secondary | ICD-10-CM | POA: Diagnosis not present

## 2024-02-11 DIAGNOSIS — I4892 Unspecified atrial flutter: Secondary | ICD-10-CM | POA: Diagnosis not present

## 2024-02-11 DIAGNOSIS — I493 Ventricular premature depolarization: Secondary | ICD-10-CM | POA: Diagnosis not present

## 2024-02-11 DIAGNOSIS — I5022 Chronic systolic (congestive) heart failure: Secondary | ICD-10-CM | POA: Diagnosis not present

## 2024-02-11 DIAGNOSIS — Z79899 Other long term (current) drug therapy: Secondary | ICD-10-CM | POA: Diagnosis not present

## 2024-02-11 HISTORY — PX: RIGHT HEART CATH: CATH118263

## 2024-02-11 SURGERY — RIGHT HEART CATH
Anesthesia: LOCAL

## 2024-02-11 MED ORDER — FREE WATER: 250.0000 mL | Freq: Once | Status: DC

## 2024-02-11 MED ORDER — LIDOCAINE HCL (PF) 1 % IJ SOLN
INTRAMUSCULAR | Status: AC
  Filled 2024-02-11: qty 30

## 2024-02-11 MED ORDER — ACETAMINOPHEN 325 MG PO TABS: 650.0000 mg | ORAL_TABLET | ORAL | Status: DC | PRN

## 2024-02-11 MED ORDER — ONDANSETRON HCL 4 MG/2ML IJ SOLN: 4.0000 mg | Freq: Four times a day (QID) | INTRAMUSCULAR | Status: DC | PRN

## 2024-02-11 MED ORDER — SODIUM CHLORIDE 0.9 % IV SOLN: 250.0000 mL | INTRAVENOUS | Status: DC | PRN

## 2024-02-11 MED ORDER — HEPARIN (PORCINE) IN NACL 1000-0.9 UT/500ML-% IV SOLN
INTRAVENOUS | Status: DC | PRN
  Administered 2024-02-11: 500 mL

## 2024-02-11 MED ORDER — SODIUM CHLORIDE 0.9% FLUSH: 3.0000 mL | INTRAVENOUS | Status: DC | PRN

## 2024-02-11 MED ORDER — HYDRALAZINE HCL 20 MG/ML IJ SOLN: 10.0000 mg | INTRAMUSCULAR | Status: DC | PRN

## 2024-02-11 MED ORDER — LIDOCAINE HCL (PF) 1 % IJ SOLN
INTRAMUSCULAR | Status: DC | PRN
  Administered 2024-02-11: 2 mL

## 2024-02-11 MED ORDER — MEXILETINE HCL 150 MG PO CAPS: 150.0000 mg | ORAL_CAPSULE | Freq: Two times a day (BID) | ORAL | 6 refills | Status: DC

## 2024-02-11 MED ORDER — DIGOXIN 125 MCG PO TABS: 0.1250 mg | ORAL_TABLET | Freq: Every day | ORAL | 11 refills | Status: DC

## 2024-02-11 MED ORDER — SODIUM CHLORIDE 0.9% FLUSH: 3.0000 mL | Freq: Two times a day (BID) | INTRAVENOUS | Status: DC

## 2024-02-11 MED ORDER — LABETALOL HCL 5 MG/ML IV SOLN: 10.0000 mg | INTRAVENOUS | Status: DC | PRN

## 2024-02-11 SURGICAL SUPPLY — 6 items
CATH BALLN WEDGE 5F 110CM (CATHETERS) IMPLANT
CATH SWAN GANZ 7F STRAIGHT (CATHETERS) IMPLANT
GLIDESHEATH SLENDER 7FR .021G (SHEATH) IMPLANT
GUIDEWIRE .025 260CM (WIRE) IMPLANT
PACK CARDIAC CATHETERIZATION (CUSTOM PROCEDURE TRAY) ×1 IMPLANT
SHEATH PROBE COVER 6X72 (BAG) IMPLANT

## 2024-02-11 NOTE — Interval H&P Note (Signed)
 History and Physical Interval Note:  02/11/2024 7:52 AM  Wyatt Donovan  has presented today for surgery, with the diagnosis of Heart Failure.  The various methods of treatment have been discussed with the patient and family. After consideration of risks, benefits and other options for treatment, the patient has consented to  Procedures: RIGHT HEART CATH (N/A) as a surgical intervention.  The patient's history has been reviewed, patient examined, no change in status, stable for surgery.  I have reviewed the patient's chart and labs.  Questions were answered to the patient's satisfaction.     Eliana Lueth [employer]

## 2024-02-11 NOTE — Discharge Instructions (Addendum)
 1. Start mexiletine 150 mg twice daily 2. Increase digoxin  to full tablet 0.125 mg daily.  3. I will have electrophysiology dept call you for appointment. If you do not hear in the next week, call my office please.  4. I will make you an appointment to discuss left ventricular assist device and heart transplant in my office.       Brachial Site Care   This sheet gives you information about how to care for yourself after your procedure. Your health care provider may also give you more specific instructions. If you have problems or questions, contact your health care provider. What can I expect after the procedure? After the procedure, it is common to have: Bruising and tenderness at the catheter insertion area. Follow these instructions at home:  Insertion site care Follow instructions from your health care provider about how to take care of your insertion site. Make sure you: Wash your hands with soap and water before you change your bandage (dressing). If soap and water are not available, use hand sanitizer. Remove your dressing as told by your health care provider. In 24 hours Check your insertion site every day for signs of infection. Check for: Redness, swelling, or pain. Pus or a bad smell. Warmth. You may shower 24-48 hours after the procedure. Do not apply powder or lotion to the site.  Activity For 24 hours after the procedure, or as directed by your health care provider: Do not push or pull heavy objects with the affected arm. Do not drive yourself home from the hospital or clinic. You may drive 24 hours after the procedure unless your health care provider tells you not to. Do not lift anything that is heavier than 10 lb (4.5 kg), or the limit that you are told, until your health care provider says that it is safe.  For 24 hours

## 2024-02-11 NOTE — Progress Notes (Addendum)
 Discharge instructions, medication changes, and upcoming appointments reviewed with patient and father at bedside. Denies questions concerns. incision site remains clean dry and intact. No s/s of complications.

## 2024-02-12 ENCOUNTER — Encounter (HOSPITAL_COMMUNITY): Payer: Self-pay | Admitting: Cardiology

## 2024-02-14 LAB — POCT I-STAT EG7
Acid-Base Excess: 1 mmol/L (ref 0.0–2.0)
Acid-Base Excess: 2 mmol/L (ref 0.0–2.0)
Bicarbonate: 26.1 mmol/L (ref 20.0–28.0)
Bicarbonate: 26.8 mmol/L (ref 20.0–28.0)
Calcium, Ion: 1.17 mmol/L (ref 1.15–1.40)
Calcium, Ion: 1.19 mmol/L (ref 1.15–1.40)
HCT: 38 % — ABNORMAL LOW (ref 39.0–52.0)
HCT: 39 % (ref 39.0–52.0)
Hemoglobin: 12.9 g/dL — ABNORMAL LOW (ref 13.0–17.0)
Hemoglobin: 13.3 g/dL (ref 13.0–17.0)
O2 Saturation: 61 %
O2 Saturation: 63 %
Potassium: 3.7 mmol/L (ref 3.5–5.1)
Potassium: 3.8 mmol/L (ref 3.5–5.1)
Sodium: 144 mmol/L (ref 135–145)
Sodium: 144 mmol/L (ref 135–145)
TCO2: 27 mmol/L (ref 22–32)
TCO2: 28 mmol/L (ref 22–32)
pCO2, Ven: 42.4 mmHg — ABNORMAL LOW (ref 44–60)
pCO2, Ven: 43.4 mmHg — ABNORMAL LOW (ref 44–60)
pH, Ven: 7.397 (ref 7.25–7.43)
pH, Ven: 7.399 (ref 7.25–7.43)
pO2, Ven: 32 mmHg (ref 32–45)
pO2, Ven: 33 mmHg (ref 32–45)

## 2024-02-15 NOTE — Progress Notes (Signed)
 Remote ICD Transmission

## 2024-02-17 ENCOUNTER — Ambulatory Visit

## 2024-02-18 ENCOUNTER — Ambulatory Visit: Payer: Self-pay | Admitting: Cardiovascular Disease

## 2024-02-18 ENCOUNTER — Ambulatory Visit: Attending: Cardiovascular Disease

## 2024-02-18 DIAGNOSIS — I5022 Chronic systolic (congestive) heart failure: Secondary | ICD-10-CM

## 2024-02-18 LAB — CUP PACEART INCLINIC DEVICE CHECK
Date Time Interrogation Session: 20251219121628
Implantable Lead Connection Status: 753985
Implantable Lead Connection Status: 753985
Implantable Lead Implant Date: 20221213
Implantable Lead Implant Date: 20221213
Implantable Lead Location: 753859
Implantable Lead Location: 753860
Implantable Lead Model: 673
Implantable Lead Model: 7841
Implantable Lead Serial Number: 1211072
Implantable Lead Serial Number: 160080
Implantable Pulse Generator Implant Date: 20221213
Pulse Gen Serial Number: 617464

## 2024-02-18 NOTE — Patient Instructions (Signed)
 Follow up as scheduled.

## 2024-02-18 NOTE — Progress Notes (Signed)
 Pt seen in device clinic for reprogramming d/t CCM pacing registering as atrial events on BS ICD causing Pt to inappropriately mode switch.  Pt seen by BS representative and CCM representative.  No reprogramming needed of BS ICD.    CCM reprogrammed to eliminate inappropriate mode switch.  Pt scheduled for follow up with Dr. Nancey in [place].

## 2024-02-22 NOTE — Addendum Note (Signed)
 Encounter addended by: Debarah Garrison MATSU, RN on: 02/22/2024 10:26 AM  Actions taken: Imaging Exam ended

## 2024-02-23 ENCOUNTER — Emergency Department (HOSPITAL_COMMUNITY)

## 2024-02-23 ENCOUNTER — Ambulatory Visit (HOSPITAL_BASED_OUTPATIENT_CLINIC_OR_DEPARTMENT_OTHER)
Admission: RE | Admit: 2024-02-23 | Discharge: 2024-02-23 | Disposition: A | Discharge status: Home / Self Care | Source: Ambulatory Visit | Attending: Cardiology | Admitting: Cardiology

## 2024-02-23 ENCOUNTER — Other Ambulatory Visit: Payer: Self-pay

## 2024-02-23 ENCOUNTER — Inpatient Hospital Stay (HOSPITAL_COMMUNITY)
Admission: EM | Admit: 2024-02-23 | Discharge: 2024-03-06 | DRG: 292 | Disposition: A | Discharge status: Home / Self Care | Attending: Internal Medicine | Admitting: Internal Medicine

## 2024-02-23 ENCOUNTER — Encounter (HOSPITAL_COMMUNITY): Payer: Self-pay

## 2024-02-23 DIAGNOSIS — E875 Hyperkalemia: Secondary | ICD-10-CM | POA: Diagnosis present

## 2024-02-23 DIAGNOSIS — I513 Intracardiac thrombosis, not elsewhere classified: Secondary | ICD-10-CM

## 2024-02-23 DIAGNOSIS — Z23 Encounter for immunization: Secondary | ICD-10-CM

## 2024-02-23 DIAGNOSIS — R42 Dizziness and giddiness: Secondary | ICD-10-CM

## 2024-02-23 DIAGNOSIS — Z743 Need for continuous supervision: Secondary | ICD-10-CM | POA: Diagnosis not present

## 2024-02-23 DIAGNOSIS — K625 Hemorrhage of anus and rectum: Secondary | ICD-10-CM

## 2024-02-23 DIAGNOSIS — N179 Acute kidney failure, unspecified: Secondary | ICD-10-CM | POA: Diagnosis present

## 2024-02-23 DIAGNOSIS — I25118 Atherosclerotic heart disease of native coronary artery with other forms of angina pectoris: Secondary | ICD-10-CM | POA: Diagnosis present

## 2024-02-23 DIAGNOSIS — R079 Chest pain, unspecified: Secondary | ICD-10-CM | POA: Diagnosis not present

## 2024-02-23 DIAGNOSIS — K648 Other hemorrhoids: Secondary | ICD-10-CM | POA: Diagnosis present

## 2024-02-23 DIAGNOSIS — I5023 Acute on chronic systolic (congestive) heart failure: Secondary | ICD-10-CM | POA: Diagnosis present

## 2024-02-23 DIAGNOSIS — I4819 Other persistent atrial fibrillation: Secondary | ICD-10-CM | POA: Diagnosis present

## 2024-02-23 DIAGNOSIS — E669 Obesity, unspecified: Secondary | ICD-10-CM | POA: Diagnosis present

## 2024-02-23 DIAGNOSIS — I255 Ischemic cardiomyopathy: Secondary | ICD-10-CM | POA: Diagnosis present

## 2024-02-23 DIAGNOSIS — Z951 Presence of aortocoronary bypass graft: Secondary | ICD-10-CM

## 2024-02-23 DIAGNOSIS — I4892 Unspecified atrial flutter: Secondary | ICD-10-CM | POA: Diagnosis present

## 2024-02-23 DIAGNOSIS — I5022 Chronic systolic (congestive) heart failure: Secondary | ICD-10-CM | POA: Insufficient documentation

## 2024-02-23 DIAGNOSIS — I2510 Atherosclerotic heart disease of native coronary artery without angina pectoris: Secondary | ICD-10-CM | POA: Diagnosis present

## 2024-02-23 DIAGNOSIS — I4811 Longstanding persistent atrial fibrillation: Secondary | ICD-10-CM

## 2024-02-23 DIAGNOSIS — K5909 Other constipation: Secondary | ICD-10-CM | POA: Diagnosis present

## 2024-02-23 DIAGNOSIS — I429 Cardiomyopathy, unspecified: Secondary | ICD-10-CM | POA: Diagnosis present

## 2024-02-23 DIAGNOSIS — I25810 Atherosclerosis of coronary artery bypass graft(s) without angina pectoris: Secondary | ICD-10-CM | POA: Diagnosis not present

## 2024-02-23 DIAGNOSIS — I252 Old myocardial infarction: Secondary | ICD-10-CM | POA: Diagnosis not present

## 2024-02-23 DIAGNOSIS — Z955 Presence of coronary angioplasty implant and graft: Secondary | ICD-10-CM

## 2024-02-23 DIAGNOSIS — F419 Anxiety disorder, unspecified: Secondary | ICD-10-CM | POA: Diagnosis present

## 2024-02-23 DIAGNOSIS — I272 Pulmonary hypertension, unspecified: Secondary | ICD-10-CM | POA: Diagnosis present

## 2024-02-23 DIAGNOSIS — Z95810 Presence of automatic (implantable) cardiac defibrillator: Secondary | ICD-10-CM | POA: Diagnosis not present

## 2024-02-23 DIAGNOSIS — I959 Hypotension, unspecified: Secondary | ICD-10-CM | POA: Diagnosis present

## 2024-02-23 DIAGNOSIS — Z800 Family history of malignant neoplasm of digestive organs: Secondary | ICD-10-CM

## 2024-02-23 DIAGNOSIS — I493 Ventricular premature depolarization: Secondary | ICD-10-CM | POA: Diagnosis not present

## 2024-02-23 DIAGNOSIS — Z885 Allergy status to narcotic agent status: Secondary | ICD-10-CM | POA: Diagnosis not present

## 2024-02-23 DIAGNOSIS — R0602 Shortness of breath: Secondary | ICD-10-CM | POA: Diagnosis present

## 2024-02-23 DIAGNOSIS — Z87891 Personal history of nicotine dependence: Secondary | ICD-10-CM | POA: Diagnosis not present

## 2024-02-23 DIAGNOSIS — E8721 Acute metabolic acidosis: Secondary | ICD-10-CM | POA: Diagnosis present

## 2024-02-23 DIAGNOSIS — F32A Depression, unspecified: Secondary | ICD-10-CM | POA: Diagnosis present

## 2024-02-23 DIAGNOSIS — Z7901 Long term (current) use of anticoagulants: Secondary | ICD-10-CM | POA: Diagnosis not present

## 2024-02-23 DIAGNOSIS — E7800 Pure hypercholesterolemia, unspecified: Secondary | ICD-10-CM | POA: Diagnosis present

## 2024-02-23 DIAGNOSIS — I5043 Acute on chronic combined systolic (congestive) and diastolic (congestive) heart failure: Secondary | ICD-10-CM

## 2024-02-23 DIAGNOSIS — I214 Non-ST elevation (NSTEMI) myocardial infarction: Secondary | ICD-10-CM | POA: Diagnosis not present

## 2024-02-23 DIAGNOSIS — Z79899 Other long term (current) drug therapy: Secondary | ICD-10-CM

## 2024-02-23 DIAGNOSIS — Z6829 Body mass index (BMI) 29.0-29.9, adult: Secondary | ICD-10-CM

## 2024-02-23 LAB — COMPREHENSIVE METABOLIC PANEL WITH GFR
ALT: 30 U/L (ref 0–44)
AST: 28 U/L (ref 15–41)
Albumin: 4.1 g/dL (ref 3.5–5.0)
Alkaline Phosphatase: 88 U/L (ref 38–126)
Anion gap: 14 (ref 5–15)
BUN: 21 mg/dL — ABNORMAL HIGH (ref 6–20)
CO2: 19 mmol/L — ABNORMAL LOW (ref 22–32)
Calcium: 9.3 mg/dL (ref 8.9–10.3)
Chloride: 107 mmol/L (ref 98–111)
Creatinine, Ser: 1.16 mg/dL (ref 0.61–1.24)
GFR, Estimated: >60 mL/min
Glucose, Bld: 95 mg/dL (ref 70–99)
Potassium: 5 mmol/L (ref 3.5–5.1)
Sodium: 140 mmol/L (ref 135–145)
Total Bilirubin: 0.7 mg/dL (ref 0.0–1.2)
Total Protein: 6.8 g/dL (ref 6.5–8.1)

## 2024-02-23 LAB — ECHOCARDIOGRAM COMPLETE
Area-P 1/2: 2.09 cm2
Calc EF: 21.7 %
MV M vel: 3.26 m/s
MV Peak grad: 42.5 mmHg
S' Lateral: 5.3 cm
Single Plane A2C EF: 15.6 %
Single Plane A4C EF: 23.1 %

## 2024-02-23 LAB — CBC
HCT: 44.2 % (ref 39.0–52.0)
Hemoglobin: 14.5 g/dL (ref 13.0–17.0)
MCH: 30.8 pg (ref 26.0–34.0)
MCHC: 32.8 g/dL (ref 30.0–36.0)
MCV: 93.8 fL (ref 80.0–100.0)
Platelets: 277 K/uL (ref 150–400)
RBC: 4.71 MIL/uL (ref 4.22–5.81)
RDW: 15.3 % (ref 11.5–15.5)
WBC: 9.5 K/uL (ref 4.0–10.5)
nRBC: 0 % (ref 0.0–0.2)

## 2024-02-23 LAB — TROPONIN T, HIGH SENSITIVITY
Troponin T High Sensitivity: 40 ng/L — ABNORMAL HIGH (ref 0–19)
Troponin T High Sensitivity: 35 ng/L — ABNORMAL HIGH (ref 0–19)
Troponin T High Sensitivity: 41 ng/L — ABNORMAL HIGH (ref 0–19)

## 2024-02-23 LAB — CBG MONITORING, ED: Glucose-Capillary: 94 mg/dL (ref 70–99)

## 2024-02-23 LAB — PRO BRAIN NATRIURETIC PEPTIDE: Pro Brain Natriuretic Peptide: 668 pg/mL — ABNORMAL HIGH

## 2024-02-23 MED ORDER — POLYETHYLENE GLYCOL 3350 17 G PO PACK: 17.0000 g | PACK | Freq: Every day | ORAL | Status: DC | PRN

## 2024-02-23 MED ORDER — SODIUM CHLORIDE 0.9% FLUSH: 3.0000 mL | INTRAVENOUS | Status: DC | PRN

## 2024-02-23 MED ORDER — FUROSEMIDE 10 MG/ML IJ SOLN
40.0000 mg | Freq: Once | INTRAMUSCULAR | Status: AC
  Administered 2024-02-23: 40 mg via INTRAVENOUS
  Filled 2024-02-23: qty 4

## 2024-02-23 MED ORDER — INFLUENZA VIRUS VACC SPLIT PF (FLUZONE) 0.5 ML IM SUSY
0.5000 mL | PREFILLED_SYRINGE | INTRAMUSCULAR | Status: AC
  Administered 2024-02-24: 0.5 mL via INTRAMUSCULAR
  Filled 2024-02-23: qty 0.5

## 2024-02-23 MED ORDER — SODIUM ZIRCONIUM CYCLOSILICATE 5 G PO PACK
5.0000 g | PACK | Freq: Every day | ORAL | Status: DC
  Administered 2024-02-23 – 2024-02-24 (×2): 5 g via ORAL
  Filled 2024-02-23 (×4): qty 1

## 2024-02-23 MED ORDER — PANTOPRAZOLE SODIUM 40 MG PO TBEC
40.0000 mg | DELAYED_RELEASE_TABLET | Freq: Two times a day (BID) | ORAL | Status: DC
  Administered 2024-02-23: 40 mg via ORAL
  Filled 2024-02-23: qty 1

## 2024-02-23 MED ORDER — ACETAMINOPHEN 325 MG PO TABS
650.0000 mg | ORAL_TABLET | ORAL | Status: DC | PRN
  Administered 2024-02-24 – 2024-02-28 (×2): 650 mg via ORAL
  Filled 2024-02-23: qty 2

## 2024-02-23 MED ORDER — SODIUM CHLORIDE 0.9 % IV SOLN: 250.0000 mL | INTRAVENOUS | Status: AC | PRN

## 2024-02-23 MED ORDER — PERFLUTREN LIPID MICROSPHERE
1.0000 mL | INTRAVENOUS | Status: DC | PRN
  Administered 2024-02-23: 3 mL via INTRAVENOUS

## 2024-02-23 MED ORDER — MORPHINE SULFATE (PF) 2 MG/ML IV SOLN
2.0000 mg | INTRAVENOUS | Status: DC | PRN
  Administered 2024-02-23 – 2024-02-27 (×20): 2 mg via INTRAVENOUS
  Filled 2024-02-23 (×20): qty 1

## 2024-02-23 MED ORDER — MORPHINE SULFATE (PF) 2 MG/ML IV SOLN
2.0000 mg | Freq: Once | INTRAVENOUS | Status: AC
  Administered 2024-02-23: 2 mg via INTRAVENOUS
  Filled 2024-02-23: qty 1

## 2024-02-23 MED ORDER — SODIUM CHLORIDE 0.9% FLUSH
3.0000 mL | Freq: Two times a day (BID) | INTRAVENOUS | Status: DC
  Administered 2024-02-23 – 2024-03-06 (×24): 3 mL via INTRAVENOUS

## 2024-02-23 MED ORDER — NITROGLYCERIN 0.4 MG SL SUBL
0.4000 mg | SUBLINGUAL_TABLET | SUBLINGUAL | Status: DC | PRN
  Administered 2024-02-23 – 2024-02-26 (×7): 0.4 mg via SUBLINGUAL
  Filled 2024-02-23 (×3): qty 1

## 2024-02-23 MED ORDER — DIGOXIN 125 MCG PO TABS
0.1250 mg | ORAL_TABLET | Freq: Every day | ORAL | Status: DC
  Administered 2024-02-24 – 2024-02-27 (×4): 0.125 mg via ORAL
  Filled 2024-02-23 (×4): qty 1

## 2024-02-23 MED ORDER — ATORVASTATIN CALCIUM 80 MG PO TABS
80.0000 mg | ORAL_TABLET | Freq: Every day | ORAL | Status: DC
  Administered 2024-02-24 – 2024-03-06 (×10): 80 mg via ORAL
  Filled 2024-02-23 (×5): qty 1

## 2024-02-23 MED ORDER — MEXILETINE HCL 150 MG PO CAPS
150.0000 mg | ORAL_CAPSULE | Freq: Two times a day (BID) | ORAL | Status: DC
  Administered 2024-02-23 – 2024-02-24 (×3): 150 mg via ORAL
  Filled 2024-02-23 (×3): qty 1

## 2024-02-23 MED ORDER — PANTOPRAZOLE SODIUM 40 MG PO TBEC
40.0000 mg | DELAYED_RELEASE_TABLET | Freq: Two times a day (BID) | ORAL | Status: DC
  Administered 2024-02-24 – 2024-03-06 (×17): 40 mg via ORAL
  Filled 2024-02-23 (×9): qty 1

## 2024-02-23 MED ORDER — ONDANSETRON HCL 4 MG/2ML IJ SOLN
4.0000 mg | Freq: Four times a day (QID) | INTRAMUSCULAR | Status: DC | PRN
  Administered 2024-02-24 – 2024-03-02 (×8): 4 mg via INTRAVENOUS
  Filled 2024-02-23 (×3): qty 2

## 2024-02-23 NOTE — Consult Note (Signed)
 "   CARDIOLOGY CONSULT NOTE    Patient ID: Wyatt Donovan; [id number]; 1985-04-28   Admit date: 02/23/2024 Date of Consult: 02/23/2024  Primary Care Provider: Orpha Yancey LABOR, MD Primary Cardiologist:  Primary Electrophysiologist:     History of Present Illness:   Wyatt Donovan is a 38 y/o M known to have CAD s/p PCI followed by CABG (LIMA to LAD patent, remaining grafts occluded), chronic systolic heart failure with LVEF 35% s/p [employer] ICD, frequent PVCs, history of LV thrombus, atrial flutter/fibrillation s/p DCCV in 2022, history of rectal bleeding was sent from a Wyatt Donovan to the ER for worsening symptoms of DOE, 4 pillow orthopnea, abdominal distention, nitro responsive chest pain and dizziness.  Patient reported that he had underwent RHC on 03/12/2023 that showed normal RA pressures and mildly elevated PCWP.  He was asymptomatic at that time.  After the procedure, he started developing symptoms of DOE, 4 pillow orthopnea, abdominal distention.  He was also having chest pains in the last couple of weeks especially at rest.  He took SL NTG that partially resolved the chest pains.  He is not much active to have any chest pains.  He also reported new onset of dizziness and blurry vision in the last couple of weeks.  Noticed bleeding per rectum in the last 1 week.  Occurred daily.  No leg swelling.  Echocardiogram today showed LVEF 20 to 25% with RWMA (akinetic anteroseptal wall and apex) and hypokinesis in the remaining segments.  No LV thrombus noted by definitive.  EKG showed NSR, first-degree AV block, frequent PVCs.    Past Medical History:  Diagnosis Date   Atrial fibrillation (HCC)    Atypical chest pain    CAD (coronary artery disease)    CHF (congestive heart failure) (HCC)    Dyslipidemia    Ex-smoker    History of depression    Hyperlipidemia    Insomnia    Ischemic cardiomyopathy    Ejection fraction 40-45%   NSTEMI (non-ST elevated myocardial infarction) (HCC)  08/01/2007   Treated with a bare metal stent to the proximal LAD, pt states MI x3   Suicide attempt (HCC) 01/30/2001    Past Surgical History:  Procedure Laterality Date   ADENOIDECTOMY     CARDIOVERSION N/A 12/06/2020   Procedure: CARDIOVERSION;  Surgeon: Rolan Ezra RAMAN, MD;  Location: [facility] ENDOSCOPY;  Service: Cardiovascular;  Laterality: N/A;   CCM GENERATOR AND A/V LEAD INSERTION N/A 04/17/2022   Procedure: CCM GENERATOR AND A/V LEAD INSERTION;  Surgeon: Cindie Ole DASEN, MD;  Location: [facility] INVASIVE CV LAB;  Service: Cardiovascular;  Laterality: N/A;   COLONOSCOPY     CORONARY ARTERY BYPASS GRAFT N/A 07/03/2020   Procedure: CORONARY ARTERY BYPASS GRAFTING (CABG) times four using left internal mammary artery, right arm radial artery and right leg saphenous vein;  Surgeon: Shyrl Linnie KIDD, MD;  Location: [facility] OR;  Service: Open Heart Surgery;  Laterality: N/A;   CORONARY STENT INTERVENTION N/A 12/03/2020   Procedure: CORONARY STENT INTERVENTION;  Surgeon: Jordan, Peter M, MD;  Location: [facility] INVASIVE CV LAB;  Service: Cardiovascular;  Laterality: N/A;   CORONARY STENT PLACEMENT     Bare metal stent to proximal LAD   ESOPHAGOGASTRODUODENOSCOPY (EGD) WITH PROPOFOL  N/A 06/24/2023   Procedure: ESOPHAGOGASTRODUODENOSCOPY (EGD) WITH PROPOFOL ;  Surgeon: Federico Rosario BROCKS, MD;  Location: [facility] ENDOSCOPY;  Service: Gastroenterology;  Laterality: N/A;   ICD IMPLANT N/A 02/10/2021   Procedure: ICD IMPLANT;  Surgeon: Cindie Ole DASEN, MD;  Location:  [facility] INVASIVE CV LAB;  Service: Cardiovascular;  Laterality: N/A;   LEFT HEART CATH AND CORONARY ANGIOGRAPHY N/A 07/01/2020   Procedure: LEFT HEART CATH AND CORONARY ANGIOGRAPHY;  Surgeon: Court Dorn PARAS, MD;  Location: [facility] INVASIVE CV LAB;  Service: Cardiovascular;  Laterality: N/A;   LEFT HEART CATH AND CORS/GRAFTS ANGIOGRAPHY N/A 12/03/2020   Procedure: LEFT HEART CATH AND CORS/GRAFTS ANGIOGRAPHY;  Surgeon: Jordan, Peter M, MD;  Location: [facility] INVASIVE CV  LAB;  Service: Cardiovascular;  Laterality: N/A;   RADIAL ARTERY HARVEST Right 07/03/2020   Procedure: RADIAL ARTERY HARVEST;  Surgeon: Shyrl Linnie KIDD, MD;  Location: [facility] OR;  Service: Open Heart Surgery;  Laterality: Right;   RIGHT HEART CATH N/A 02/11/2024   Procedure: RIGHT HEART CATH;  Surgeon: Rolan Ezra RAMAN, MD;  Location: [facility] INVASIVE CV LAB;  Service: Cardiovascular;  Laterality: N/A;   RIGHT/LEFT HEART CATH AND CORONARY/GRAFT ANGIOGRAPHY N/A 02/19/2023   Procedure: RIGHT/LEFT HEART CATH AND CORONARY/GRAFT ANGIOGRAPHY;  Surgeon: Rolan Ezra RAMAN, MD;  Location: [facility] INVASIVE CV LAB;  Service: Cardiovascular;  Laterality: N/A;   TEE WITHOUT CARDIOVERSION N/A 07/03/2020   Procedure: TRANSESOPHAGEAL ECHOCARDIOGRAM (TEE);  Surgeon: Shyrl Linnie KIDD, MD;  Location: [facility] OR;  Service: Open Heart Surgery;  Laterality: N/A;   TYMPANOSTOMY TUBE PLACEMENT     when I was a kid   UPPER GASTROINTESTINAL ENDOSCOPY         Inpatient Medications: Scheduled Meds:  Continuous Infusions:  PRN Meds:   Allergies:   Allergies[1]  Social History:   Social History   Socioeconomic History   Marital status: Single    Spouse name: Not on file   Number of children: Not on file   Years of education: Not on file   Highest education level: High school graduate  Occupational History   Occupation: Not actively working    Comment: applied for disability x 2.   Tobacco Use   Smoking status: Former    Current packs/day: 0.00    Average packs/day: 1 pack/day for 22.0 years (22.0 ttl pk-yrs)    Types: Cigarettes, Cigars    Start date: 06/29/1998    Quit date: 06/28/2020    Years since quitting: 3.6   Smokeless tobacco: Never   Tobacco comments:    cigarettes stopped in 2020, [employer] started in 2018-04-05/day  Vaping Use   Vaping status: Never Used  Substance and Sexual Activity   Alcohol use: Never   Drug use: Not Currently    Types: Marijuana   Sexual activity: Never  Other Topics  Concern   Not on file  Social History Narrative   Single   Lives with his parents   Trying to get his GED   DeGent date all cultures   Social Drivers of Health   Tobacco Use: Medium Risk (02/23/2024)   Patient History    Smoking Tobacco Use: Former    Smokeless Tobacco Use: Never    Passive Exposure: Not on Actuary Strain: Not on file  Food Insecurity: No Food Insecurity (03/19/2023)   Hunger Vital Sign    Worried About Running Out of Food in the Last Year: Never true    Ran Out of Food in the Last Year: Never true  Transportation Needs: No Transportation Needs (03/19/2023)   PRAPARE - Administrator, Civil Service (Medical): No    Lack of Transportation (Non-Medical): No  Physical Activity: Not on file  Stress: Not on file  Social Connections: Not on file  Intimate Partner Violence: Not At Risk (03/19/2023)   Humiliation, Afraid, Rape, and Kick questionnaire    Fear of Current or Ex-Partner: No    Emotionally Abused: No    Physically Abused: No    Sexually Abused: No  Depression (PHQ2-9): Medium Risk (04/14/2021)   Depression (PHQ2-9)    PHQ-2 Score: 6  Alcohol Screen: Not on file  Housing: Low Risk (03/19/2023)   Housing Stability Vital Sign    Unable to Pay for Housing in the Last Year: No    Number of Times Moved in the Last Year: 0    Homeless in the Last Year: No  Utilities: Not At Risk (03/19/2023)   AHC Utilities    Threatened with loss of utilities: No  Health Literacy: Not on file    Family History:  Family History  Problem Relation Age of Onset   Colon cancer Father 27   Colon cancer Paternal Grandfather    Colon cancer Paternal Aunt    Hypertension Neg Hx    Diabetes Neg Hx    Coronary artery disease Neg Hx    Esophageal cancer Neg Hx    Rectal cancer Neg Hx    Stomach cancer Neg Hx      ROS:  Please see the history of present illness.  ROS  All other ROS reviewed and negative.     Physical Exam/Data:   Vitals:    02/23/24 1236 02/23/24 1244  BP: 100/73   Pulse: 73   Resp: 15   Temp: 97.7 F (36.5 C)   TempSrc: Oral   SpO2: 100%   Weight:  102.1 kg  Height:  6' 1 (1.854 m)   No intake or output data in the 24 hours ending 02/23/24 1336 Filed Weights   02/23/24 1244  Weight: 102.1 kg   Body mass index is 29.69 kg/m.  General:  Well nourished, well developed, in no acute distress HEENT: normal Lymph: no adenopathy Neck: no JVD Endocrine:  No thryomegaly Vascular: No carotid bruits; FA pulses 2+ bilaterally without bruits  Cardiac:  normal S1, S2; RRR; no murmur  Lungs:  clear to auscultation bilaterally, no wheezing, rhonchi or rales  Abd: soft, nontender, no hepatomegaly  Ext: no edema Musculoskeletal:  No deformities, BUE and BLE strength normal and equal Skin: warm and dry  Neuro:  CNs 2-12 intact, no focal abnormalities noted Psych:  Normal affect   Relevant CV Studies:   Laboratory Data:  ChemistryNo results for input(s): NA, K, CL, CO2, GLUCOSE, BUN, CREATININE, CALCIUM , GFRNONAA, GFRAA, ANIONGAP in the last 168 hours.  No results for input(s): PROT, ALBUMIN , AST, ALT, ALKPHOS, BILITOT in the last 168 hours. HematologyNo results for input(s): WBC, RBC, HGB, HCT, MCV, MCH, MCHC, RDW, PLT in the last 168 hours. Cardiac EnzymesNo results for input(s): TROPONINI in the last 168 hours. No results for input(s): TROPIPOC in the last 168 hours.  BNPNo results for input(s): BNP, PROBNP in the last 168 hours.  DDimer No results for input(s): DDIMER in the last 168 hours.  Radiology/Studies:  DG Chest Portable 1 View Result Date: 02/23/2024 CLINICAL DATA:  Chest pain EXAM: PORTABLE CHEST 1 VIEW COMPARISON:  October 23, 2022 FINDINGS: Stable cardiomediastinal silhouette. Lungs are clear. Sternotomy wires are noted. Stable bilateral pacemaker placement. Bony thorax is unremarkable. IMPRESSION: No active disease. Electronically  Signed   By: Lynwood Landy Raddle M.D.   On: 02/23/2024 13:32   ECHOCARDIOGRAM COMPLETE Result Date: 02/23/2024    ECHOCARDIOGRAM REPORT   Patient  Name:   Wyatt Donovan Date of Exam: 02/23/2024 Medical Rec #:  [id number]        Height:       73.0 in Accession #:    [id number]       Weight:       220.0 lb Date of Birth:  [birth year]        BSA:          2.241 m Patient Age:    38 years         BP:           82/60 mmHg Patient Gender: M                HR:           79 bpm. Exam Location:  [facility] Procedure: 2D Echo, 3D Echo, Cardiac Doppler, Color Doppler, Strain Analysis and            Intracardiac Opacification Agent (Both Spectral and Color Flow            Doppler were utilized during procedure). Indications:    Congestive Heart Failure l50.9  History:        Patient has prior history of Echocardiogram examinations, most                 recent 02/17/2023. CHF and Cardiomyopathy, CAD and Previous                 Myocardial Infarction, Pacemaker and Defibrillator,                 Arrythmias:Atrial Fibrillation; Risk Factors:Former Smoker and                 Dyslipidemia.  Sonographer:    Aida Pizza RCS Referring Phys: [phone] EZRA GORMAN SHUCK  Sonographer Comments: Global longitudinal strain was attempted. IMPRESSIONS  1. No LV thrombus by Definity . Left ventricular ejection fraction, by estimation, is 20 to 25%. Left ventricular ejection fraction by 3D volume is 23 %. The left ventricle has severely decreased function. The left ventricle demonstrates global hypokinesis. The left ventricular internal cavity size was moderately to severely dilated. Left ventricular diastolic parameters are indeterminate. The average left ventricular global longitudinal strain is -3.9 %. The global longitudinal strain is abnormal.  2. Right ventricular systolic function is low normal. The right ventricular size is normal. Tricuspid regurgitation signal is inadequate for assessing PA pressure.  3. The mitral valve is normal in  structure. No evidence of mitral valve regurgitation. No evidence of mitral stenosis.  4. The aortic valve was not well visualized. Aortic valve regurgitation is not visualized. No aortic stenosis is present.  5. The inferior vena cava is normal in size with greater than 50% respiratory variability, suggesting right atrial pressure of 3 mmHg. Comparison(s): Changes from prior study are noted. LV function worsened with new RWMA. FINDINGS  Left Ventricle: No LV thrombus by Definity . Left ventricular ejection fraction, by estimation, is 20 to 25%. Left ventricular ejection fraction by 3D volume is 23 %. The left ventricle has severely decreased function. The left ventricle demonstrates global hypokinesis. Definity  contrast agent was given IV to delineate the left ventricular endocardial borders. The average left ventricular global longitudinal strain is -3.9 %. Strain was performed and the global longitudinal strain is abnormal. The left ventricular internal cavity size was moderately to severely dilated. There is no left ventricular hypertrophy. Left ventricular diastolic parameters are indeterminate.  LV Wall Scoring: The entire anterior septum, apical  lateral segment, and apex are akinetic. The entire anterior wall, antero-lateral wall, entire inferior wall, posterior wall, mid inferoseptal segment, and basal inferoseptal segment are hypokinetic. Right Ventricle: The right ventricular size is normal. No increase in right ventricular wall thickness. Right ventricular systolic function is low normal. Tricuspid regurgitation signal is inadequate for assessing PA pressure. Left Atrium: Left atrial size was normal in size. Right Atrium: Right atrial size was normal in size. Pericardium: There is no evidence of pericardial effusion. Mitral Valve: The mitral valve is normal in structure. No evidence of mitral valve regurgitation. No evidence of mitral valve stenosis. Tricuspid Valve: The tricuspid valve is normal in  structure. Tricuspid valve regurgitation is not demonstrated. No evidence of tricuspid stenosis. Aortic Valve: The aortic valve was not well visualized. Aortic valve regurgitation is not visualized. No aortic stenosis is present. Pulmonic Valve: The pulmonic valve was not well visualized. Pulmonic valve regurgitation is trivial. No evidence of pulmonic stenosis. Aorta: The aortic root is normal in size and structure. Venous: The inferior vena cava is normal in size with greater than 50% respiratory variability, suggesting right atrial pressure of 3 mmHg. IAS/Shunts: No atrial level shunt detected by color flow Doppler. Additional Comments: 3D was performed not requiring image post processing on an independent workstation and was abnormal.  LEFT VENTRICLE PLAX 2D LVIDd:         6.10 cm         Diastology LVIDs:         5.30 cm         LV e' medial:    7.34 cm/s LV PW:         0.90 cm         LV E/e' medial:  9.3 LV IVS:        0.80 cm         LV e' lateral:   12.20 cm/s LVOT diam:     2.10 cm         LV E/e' lateral: 5.6 LV SV:         33 LV SV Index:   15              2D Longitudinal LVOT Area:     3.46 cm        Strain                                2D Strain GLS   -3.9 %                                Avg: LV Volumes (MOD) LV vol d, MOD    110.0 ml      3D Volume EF A2C:                           LV 3D EF:    Left LV vol d, MOD    143.0 ml                   ventricul A4C:                                        ar LV vol s, MOD    92.8 ml  ejection A2C:                                        fraction LV vol s, MOD    110.0 ml                   by 3D A4C:                                        volume is LV SV MOD A2C:   17.2 ml                    23 %. LV SV MOD A4C:   143.0 ml LV SV MOD BP:    28.2 ml                                3D Volume EF:                                3D EF:        23 %                                LV EDV:       181 ml                                LV ESV:       139 ml                                 LV SV:        42 ml RIGHT VENTRICLE RV S prime:     7.21 cm/s TAPSE (M-mode): 1.6 cm LEFT ATRIUM           Index        RIGHT ATRIUM           Index LA diam:      3.20 cm 1.43 cm/m   RA Area:     12.20 cm LA Vol (A2C): 43.4 ml 19.37 ml/m  RA Volume:   27.50 ml  12.27 ml/m LA Vol (A4C): 60.7 ml 27.09 ml/m  AORTIC VALVE LVOT Vmax:   58.00 cm/s LVOT Vmean:  41.800 cm/s LVOT VTI:    0.095 m  AORTA Ao Root diam: 3.50 cm MITRAL VALVE MV Area (PHT): 2.09 cm    SHUNTS MV Decel Time: 363 msec    Systemic VTI:  0.10 m MR Peak grad: 42.5 mmHg    Systemic Diam: 2.10 cm MR Mean grad: 29.0 mmHg MR Vmax:      326.00 cm/s MR Vmean:     255.0 cm/s MV E velocity: 68.00 cm/s MV A velocity: 24.60 cm/s MV E/A ratio:  2.76 Shaylinn Hladik Priya Mickaela Starlin Electronically signed by Diannah Late Elinda Bunten Signature Date/Time: 02/23/2024/1:31:37 PM    Final     Assessment and Plan:   Acute on chronic systolic and diastolic heart failure - Patient reported new onset of DOE, 4 pillow orthopnea, abdominal distention  x couple of weeks, started after RHC on 02/11/2024.  He also reported having nitrate responsive resting chest pains, dizziness and blurred vision.  He noticed bleeding per rectum in the last 1 week.   BP 70-80s mm Hg SBP in the echo lab.  CBC pending.  Need to rule out severe symptomatic anemia.  CVP 3 mmHg on the echocardiogram from today. Needs chest x-ray to evaluate for any discrepancy between LA and RA pressures, r/o ADHF. - Admit to hospitalist team.  He will need to be transferred to [facility] for cardiology/HF evaluation.  Will be evaluated by cardiology initially.  Dizziness - Differential diagnosis include severe symptomatic anemia, frequent PVCs, markedly prolonged PR interval, low output state. EKG showed NSR, first-degree AV block with markedly prolonged PR interval 346 ms.  Reviewed prior EKGs where the PR interval was less than 300 ms.  Consider switching carvedilol  to metoprolol  and  add antiarrhythmics like amiodarone  or mexiletine.  Frequent PVCs - Event monitor from yesterday showed average HR 73 bpm.  PVC burden was 22.4%.  Beta-blockers, as above.  Ischemic cardiomyopathy s/p [employer] ICD (Drop in LVEF from 35% to 20%) CAD s/p PCI and CABG (LIMA to LAD patent, remaining grafts occluded) - Resume home GDMT as BP tolerates.  Echo today showed LVEF 20 to 25% (new from 35%) with RWMA/akinesis in the anteroseptal wall and apex and hypokinesis in the remaining segments. hold SGLT2 inhibitors if he needs any procedures. - Follows up with advanced heart failure outpatient.  History of LV thrombus - Previously on systemic AC, recent imaging did not reveal any new thrombus.  Persistent A-fib/flutter s/p DCCV in 2022 - Might need to switch Coreg  to metoprolol  due to softer blood pressures. - Continue Eliquis  5 mg twice daily.  History of GI bleeding - Rectal bleeding in the last 1 week.  Follow-up on CBC.   60 minutes spent in reviewing prior medical records, specialist notes, reports, more than 3 labs, discussion and documentation.  Complex decision making.  For questions or updates, please contact [facility] Please consult [email or web address] for contact info under Cardiology/STEMI.   Signed, Claborn Janusz Priya Adric Wrede, MD 02/23/2024 1:36 PM     [1]  Allergies Allergen Reactions   Codeine Itching   "

## 2024-02-23 NOTE — ED Notes (Signed)
-  Called [facility] for transportation to [facility].

## 2024-02-23 NOTE — Progress Notes (Signed)
*  PRELIMINARY RESULTS* Echocardiogram 2D Echocardiogram has been performed with Definity . Dr. Mallipeddi was called about his echocardiogram. She thought it best to admit to the E.D. at [facility] and then transfer to [facility] for further testing  Wyatt Donovan 02/23/2024, 12:37 PM

## 2024-02-23 NOTE — Progress Notes (Signed)
"  ° °      CROSS COVER NOTE  NAME: Wyatt Donovan MRN: [id number] DOB : 1985/10/29    Concern as stated by nurse / staff   Complains of chest pain     Pertinent findings on chart review:  Per H&P: admitted with CHF .  CAD s/p MI with LAD PCI in 2012,followed by CABG  x 4 4/22  - Troponins not consistent with ACS - Previously on Plavix , stopped on aug 2023 due to [facility]  Per Cardiology on 02/11/24 Underwent cath 01/2023 showing normal filling pressures, low cardiac output with CI 2 by Fick,1.82 thermodilution. Coronary anatomy was unchanged.  Grafts all occluded (known prior) except LIMA-LAD.  Occluded native RCA (known from prior).  Patent mid LCx stent. Post cath he was started on digoxin  0.125 mg   Patient Assessment    02/23/2024    7:44 PM 02/23/2024    4:15 PM 02/23/2024    3:30 PM  Vitals with BMI  Systolic 101 97 95  Diastolic 64 73 75  Pulse 78 54 60   Troponin 40>35>41  EKG unchanged    Patient assessment Patient evaluated at bedside with nurse present:  Complaint: Has severe low retrosternal pain which she states was initially sharp and now  pulsating.  Ongoing and constant for the past 3 days.  Resolved with nitro about 3 days ago but recurred.  Had right heart cath on 02/11/2024 and was transferred from [facility] for consideration of [facility] Physical Exam Vitals and nursing note reviewed.  Constitutional:      General: He is not in acute distress. HENT:     Head: Normocephalic and atraumatic.  Cardiovascular:     Rate and Rhythm: Normal rate and regular rhythm.     Heart sounds: Normal heart sounds.  Pulmonary:     Effort: Pulmonary effort is normal.     Breath sounds: Normal breath sounds.  Abdominal:     Palpations: Abdomen is soft.     Tenderness: There is no abdominal tenderness.  Neurological:     Mental Status: Mental status is at baseline.      Assessment and  Interventions   Assessment:  Chest pain, stable  angina  Plan: NTG SL prn  with IV morphine  for breakthrough EKG and trops-->not consistent with ACS  -Given flat troponins with history of rectal bleed few days prior(per review of H&P), will hold off on empiric IV heparin   -Continue to monitor  -Empiric GI cocktail to assess for therapeutic benefit        CRITICAL CARE Performed by: Delayne LULLA Solian   Total critical care time: 60 minutes  Critical care time was exclusive of separately billable procedures and treating other patients.  Critical care was necessary to treat or prevent imminent or life-threatening deterioration.  Critical care was time spent personally by me on the following activities: development of treatment plan with patient and/or surrogate as well as nursing, discussions with consultants, evaluation of patient's response to treatment, examination of patient, obtaining history from patient or surrogate, ordering and performing treatments and interventions, ordering and review of laboratory studies, ordering and review of radiographic studies, pulse oximetry and re-evaluation of patient's condition.  "

## 2024-02-23 NOTE — H&P (Signed)
 " History and Physical    Patient: Wyatt Donovan FMW:[id number] DOB: [birth year] DOA: 02/23/2024 DOS: the patient was seen and examined on 02/23/2024 PCP: Orpha Yancey LABOR, MD  Patient coming from: Home  Chief Complaint:  Chief Complaint  Patient presents with   Med Transfer   HPI: Wyatt Donovan is a 38 year old male with a history of HFrEF (EF 30-35%) status post AICD, CAD s/p PCI followed by CABG (LIMA to LAD patent, remaining grafts occluded), paroxysmal atrial fibrillation status post DCCV 2022, intermittent rectal bleeding, LV thrombus, hyperlipidemia, history of tobacco use in remission, and frequent PVCs presenting with orthopnea and worsening dyspnea on exertion over the last 3 to 4 days.  The patient was at the cardiology office on the day of admission getting routine echocardiogram.  On 02/23/2024 echocardiogram showed EF 20-25%, global HK, low normal RVF.  His cardiologist was notified, and the patient was directed to the emergency department for further evaluation and treatment. He states that over the last 3-4 he has had worsening dyspnea on exertion and orthopnea.  He endorses compliance with his medications although he appears to have some poor insight regarding his actual medications.  He states that about 3 days ago he had some chest discomfort that was relieved by nitroglycerin . He states that he had a bloody bowel movement about 3 days prior to this admission.  He states that prior to that he had a bloody bowel movements almost every day for about a week, which was about 2 weeks ago.  He says this has been a chronic issue for him.  He has noted some dizziness but denies any frank syncope.  He denies any worsening lower extremity edema.  He denies any fever, chills, headache, neck pain, abdominal pain, dysuria, hematuria.  In the ED, the patient was afebrile with soft blood pressures.  WBC 9.5, hemoglobin 14.5, platelet 277.  proBNP 668.  Troponin 40.  Sodium 140, potassium 5.0,  bicarbonate 19, serum creatinine 1.16.  LFTs were unremarkable.  The patient was given furosemide  40 mg IV x 1.  The patient was seen by cardiology, Dr. Mallipeddi, who recommended transfer to [facility] for advanced heart failure team to consult.  Review of Systems: As mentioned in the history of present illness. All other systems reviewed and are negative. Past Medical History:  Diagnosis Date   Atrial fibrillation (HCC)    Atypical chest pain    CAD (coronary artery disease)    CHF (congestive heart failure) (HCC)    Dyslipidemia    Ex-smoker    History of depression    Hyperlipidemia    Insomnia    Ischemic cardiomyopathy    Ejection fraction 40-45%   NSTEMI (non-ST elevated myocardial infarction) (HCC) 08/01/2007   Treated with a bare metal stent to the proximal LAD, pt states MI x3   Suicide attempt (HCC) 01/30/2001   Past Surgical History:  Procedure Laterality Date   ADENOIDECTOMY     CARDIOVERSION N/A 12/06/2020   Procedure: CARDIOVERSION;  Surgeon: Rolan Ezra RAMAN, MD;  Location: [facility] ENDOSCOPY;  Service: Cardiovascular;  Laterality: N/A;   CCM GENERATOR AND A/V LEAD INSERTION N/A 04/17/2022   Procedure: CCM GENERATOR AND A/V LEAD INSERTION;  Surgeon: Cindie Ole DASEN, MD;  Location: [facility] INVASIVE CV LAB;  Service: Cardiovascular;  Laterality: N/A;   COLONOSCOPY     CORONARY ARTERY BYPASS GRAFT N/A 07/03/2020   Procedure: CORONARY ARTERY BYPASS GRAFTING (CABG) times four using left internal mammary artery, right arm radial artery  and right leg saphenous vein;  Surgeon: Shyrl Linnie KIDD, MD;  Location: [facility] OR;  Service: Open Heart Surgery;  Laterality: N/A;   CORONARY STENT INTERVENTION N/A 12/03/2020   Procedure: CORONARY STENT INTERVENTION;  Surgeon: Jordan, Peter M, MD;  Location: [facility] INVASIVE CV LAB;  Service: Cardiovascular;  Laterality: N/A;   CORONARY STENT PLACEMENT     Bare metal stent to proximal LAD   ESOPHAGOGASTRODUODENOSCOPY (EGD) WITH PROPOFOL  N/A 06/24/2023    Procedure: ESOPHAGOGASTRODUODENOSCOPY (EGD) WITH PROPOFOL ;  Surgeon: Federico Rosario BROCKS, MD;  Location: [facility] ENDOSCOPY;  Service: Gastroenterology;  Laterality: N/A;   ICD IMPLANT N/A 02/10/2021   Procedure: ICD IMPLANT;  Surgeon: Cindie Ole DASEN, MD;  Location: [facility] INVASIVE CV LAB;  Service: Cardiovascular;  Laterality: N/A;   LEFT HEART CATH AND CORONARY ANGIOGRAPHY N/A 07/01/2020   Procedure: LEFT HEART CATH AND CORONARY ANGIOGRAPHY;  Surgeon: Court Dorn PARAS, MD;  Location: [facility] INVASIVE CV LAB;  Service: Cardiovascular;  Laterality: N/A;   LEFT HEART CATH AND CORS/GRAFTS ANGIOGRAPHY N/A 12/03/2020   Procedure: LEFT HEART CATH AND CORS/GRAFTS ANGIOGRAPHY;  Surgeon: Jordan, Peter M, MD;  Location: [facility] INVASIVE CV LAB;  Service: Cardiovascular;  Laterality: N/A;   RADIAL ARTERY HARVEST Right 07/03/2020   Procedure: RADIAL ARTERY HARVEST;  Surgeon: Shyrl Linnie KIDD, MD;  Location: [facility] OR;  Service: Open Heart Surgery;  Laterality: Right;   RIGHT HEART CATH N/A 02/11/2024   Procedure: RIGHT HEART CATH;  Surgeon: Rolan Ezra RAMAN, MD;  Location: [facility] INVASIVE CV LAB;  Service: Cardiovascular;  Laterality: N/A;   RIGHT/LEFT HEART CATH AND CORONARY/GRAFT ANGIOGRAPHY N/A 02/19/2023   Procedure: RIGHT/LEFT HEART CATH AND CORONARY/GRAFT ANGIOGRAPHY;  Surgeon: Rolan Ezra RAMAN, MD;  Location: [facility] INVASIVE CV LAB;  Service: Cardiovascular;  Laterality: N/A;   TEE WITHOUT CARDIOVERSION N/A 07/03/2020   Procedure: TRANSESOPHAGEAL ECHOCARDIOGRAM (TEE);  Surgeon: Shyrl Linnie KIDD, MD;  Location: [facility] OR;  Service: Open Heart Surgery;  Laterality: N/A;   TYMPANOSTOMY TUBE PLACEMENT     when I was a kid   UPPER GASTROINTESTINAL ENDOSCOPY     Social History:  reports that he quit smoking about 3 years ago. His smoking use included cigarettes and cigars. He started smoking about 25 years ago. He has a 22 pack-year smoking history. He has never used smokeless tobacco. He reports that he does not currently use drugs  after having used the following drugs: Marijuana. He reports that he does not drink alcohol.  Allergies[1]  Family History  Problem Relation Age of Onset   Colon cancer Father 15   Colon cancer Paternal Grandfather    Colon cancer Paternal Aunt    Hypertension Neg Hx    Diabetes Neg Hx    Coronary artery disease Neg Hx    Esophageal cancer Neg Hx    Rectal cancer Neg Hx    Stomach cancer Neg Hx     Prior to Admission medications  Medication Sig Start Date End Date Taking? Authorizing Provider  acetaminophen  (TYLENOL ) 500 MG tablet Take 500 mg by mouth every 6 (six) hours as needed for moderate pain (pain score 4-6) or headache.   Yes [provider]  albuterol  (VENTOLIN  HFA) 108 (90 Base) MCG/ACT inhaler Inhale 2 puffs into the lungs 3 (three) times a week. for wheezing 11/12/23  Yes [provider]  apixaban  (ELIQUIS ) 5 MG TABS tablet Take 1 tablet (5 mg total) by mouth 2 (two) times daily. 05/12/23  Yes Rolan Ezra RAMAN, MD  atorvastatin  (LIPITOR ) 80 MG  tablet TAKE 1 TABLET BY MOUTH DAILY 01/19/24  Yes McLean, Dalton S, MD  carvedilol  (COREG ) 6.25 MG tablet TAKE 1 TABLET BY MOUTH TWICE DAILY WITH A MEAL 01/31/24  Yes Rolan Ezra RAMAN, MD  dapagliflozin  propanediol (FARXIGA ) 10 MG TABS tablet Take 1 tablet (10 mg total) by mouth daily before breakfast. 05/27/23  Yes Rolan Ezra RAMAN, MD  digoxin  (LANOXIN ) 0.125 MG tablet Take 1 tablet (0.125 mg total) by mouth daily. 02/11/24 02/10/25 Yes Rolan Ezra RAMAN, MD  furosemide  (LASIX ) 20 MG tablet TAKE 1 TABLET BY MOUTH DAILY 01/19/24  Yes McLean, Dalton S, MD  mexiletine (MEXITIL ) 150 MG capsule Take 1 capsule (150 mg total) by mouth 2 (two) times daily. 02/11/24  Yes Rolan Ezra RAMAN, MD  nitroGLYCERIN  (NITROSTAT ) 0.4 MG SL tablet Place 1 tablet (0.4 mg total) under the tongue every 5 (five) minutes x 3 doses as needed for chest pain. 01/27/22  Yes Rolan Ezra RAMAN, MD  Omega-3 Fatty Acids (OMEGA 3 500 PO) Take 500 mg by mouth  daily.   Yes [provider]  pantoprazole  (PROTONIX ) 40 MG tablet TAKE 1 TABLET BY MOUTH TWICE DAILY BEFORE MEALS 12/20/23  Yes Collier, Amanda R, PA-C  polyethylene glycol (MIRALAX  / GLYCOLAX ) 17 g packet Take 17 g by mouth daily as needed for mild constipation or moderate constipation.   Yes [provider]  sacubitril -valsartan  (ENTRESTO ) 24-26 MG Take 1 tablet by mouth 2 (two) times daily. 05/12/23  Yes Rolan Ezra RAMAN, MD  sodium zirconium cyclosilicate  (LOKELMA ) 5 g packet Take 5 g by mouth daily. 02/17/23  Yes Rolan Ezra RAMAN, MD  spironolactone  (ALDACTONE ) 25 MG tablet TAKE 1 TABLET BY MOUTH DAILY 02/07/24  Yes Rolan Ezra RAMAN, MD    Physical Exam: Vitals:   02/23/24 1400 02/23/24 1430 02/23/24 1500 02/23/24 1515  BP: (!) 79/32 (!) 86/53 (!) 83/73 (!) 89/65  Pulse: 65 70 (!) 53 (!) 54  Resp: 12 16 17 11   Temp:      TempSrc:      SpO2: 96% 97% 97% 98%  Weight:      Height:       GENERAL:  A&O x 3, NAD, well developed, cooperative, follows commands HEENT: [place]/AT, No thrush, No icterus, No oral ulcers Neck:  No neck mass, No meningismus, soft, supple CV: RRR, no S3, no S4, no rub, no JVD Lungs:  CTA, no wheeze, no rhonchi, good air movement Abd: soft/NT +BS, nondistended Ext: No edema, no lymphangitis, no cyanosis, no rashes Neuro:  CN II-XII intact, strength 4/5 in RUE, RLE, strength 4/5 LUE, LLE; sensation intact bilateral; no dysmetria; babinski equivocal  Data Reviewed: Data reviewed above in the history  Assessment and Plan: Acute on chronic HFrEF - 02/23/2024 echo EF 20-25%, global HK, low normal RVF -CVP was 3 mmHg on echo - Defer further diuresis to cardiology - Soft blood pressures are limiting GDMT presently - Therefore, holding carvedilol , spironolactone  and Entresto  temporarily until the patient is seen by cardiology - Continue digoxin  0.  0 65 mg daily - He is normally on furosemide  20 mg daily at home - holding Farxiga  in case he needs  any procedures  Rectal bleeding - This has been intermittent, suspect hemorrhoidal bleed - Hemoglobin is at baseline~14 - Temporarily holding apixaban  - Plan to consult patient's gastroenterologist, Dr. Scarlett - 07/29/2021 colonoscopy--nonbleeding internal hemorrhoids, polyps in the transverse and sigmoid colon - 06/24/2023 EGD--normal esophagus, gastritis, normal duodenum - Start pantoprazole   Paroxysmal atrial fibrillation - Currently  in normal sinus rhythm - Holding apixaban  temporarily as discussed above  Hypotension - Patient states that his SBP has been running in the low 90s and upper 80s at home - Holding Entresto , spironolactone  carvedilol  temporarily.  Coronary disease Early onset CAD s/p MI with LAD PCI in 2012,followed by CABG  x 4 4/22  - Troponins not consistent with ACS - Previously on Plavix , stopped on aug 2023 due to BRBPR  Hyperlipidemia - Continue statin  Hyperkalemia - The patient is on sotalol, which will be restarted   Advance Care Planning: FULL  Consults: cardiology  Family Communication: none present  Severity of Illness: The appropriate patient status for this patient is INPATIENT. Inpatient status is judged to be reasonable and necessary in order to provide the required intensity of service to ensure the patient's safety. The patient's presenting symptoms, physical exam findings, and initial radiographic and laboratory data in the context of their chronic comorbidities is felt to place them at high risk for further clinical deterioration. Furthermore, it is not anticipated that the patient will be medically stable for discharge from the hospital within 2 midnights of admission.   * I certify that at the point of admission it is my clinical judgment that the patient will require inpatient hospital care spanning beyond 2 midnights from the point of admission due to high intensity of service, high risk for further deterioration and high frequency  of surveillance required.*  Author: Alm Schneider, MD 02/23/2024 3:23 PM  For on call review [email or web address].     [1]  Allergies Allergen Reactions   Codeine Itching   "

## 2024-02-23 NOTE — ED Triage Notes (Signed)
 Pt arrived POV after echocardiogram; states provider sent him to ED to be transferred to [facility] for treatment; pt states ECHO showed CO reduced by 20% since last eval. A&Ox4

## 2024-02-23 NOTE — ED Provider Notes (Signed)
 " [place] EMERGENCY DEPARTMENT AT [facility] Provider Note   CSN: [id number] Arrival date & time: 02/23/24  1222     Patient presents with: Med Transfer   Wyatt Donovan is a 38 y.o. male.   HPI Patient presents from cardiology clinic where he was having echocardiogram for fatigue, dyspnea, and results were concerning for ejection fraction 20%.  He notes last ejection fraction about 1 year ago was 35%, in the interval he been weaker than usual, without complete fall, without complete syncope.  I talked with the patient's cardiologist on arrival here.    Prior to Admission medications  Medication Sig Start Date End Date Taking? Authorizing Provider  albuterol  (VENTOLIN  HFA) 108 (90 Base) MCG/ACT inhaler Inhale 2 puffs into the lungs 3 (three) times a week. for wheezing 11/12/23   [provider]  apixaban  (ELIQUIS ) 5 MG TABS tablet Take 1 tablet (5 mg total) by mouth 2 (two) times daily. 05/12/23   Rolan Ezra RAMAN, MD  atorvastatin  (LIPITOR ) 80 MG tablet TAKE 1 TABLET BY MOUTH DAILY 01/19/24   McLean, Dalton S, MD  carvedilol  (COREG ) 6.25 MG tablet TAKE 1 TABLET BY MOUTH TWICE DAILY WITH A MEAL 01/31/24   Rolan Ezra RAMAN, MD  dapagliflozin  propanediol (FARXIGA ) 10 MG TABS tablet Take 1 tablet (10 mg total) by mouth daily before breakfast. 05/27/23   Rolan Ezra RAMAN, MD  digoxin  (LANOXIN ) 0.125 MG tablet Take 1 tablet (0.125 mg total) by mouth daily. 02/11/24 02/10/25  Rolan Ezra RAMAN, MD  furosemide  (LASIX ) 20 MG tablet TAKE 1 TABLET BY MOUTH DAILY 01/19/24   McLean, Dalton S, MD  mexiletine (MEXITIL ) 150 MG capsule Take 1 capsule (150 mg total) by mouth 2 (two) times daily. 02/11/24   Rolan Ezra RAMAN, MD  nitroGLYCERIN  (NITROSTAT ) 0.4 MG SL tablet Place 1 tablet (0.4 mg total) under the tongue every 5 (five) minutes x 3 doses as needed for chest pain. 01/27/22   Rolan Ezra RAMAN, MD  Omega-3 Fatty Acids (OMEGA 3 500 PO) Take 500 mg by mouth daily.    [provider]  pantoprazole  (PROTONIX ) 40 MG tablet TAKE 1 TABLET BY MOUTH TWICE DAILY BEFORE MEALS 12/20/23   Collier, Amanda R, PA-C  polyethylene glycol (MIRALAX  / GLYCOLAX ) 17 g packet Take 17 g by mouth daily as needed for mild constipation or moderate constipation.    [provider]  sacubitril -valsartan  (ENTRESTO ) 24-26 MG Take 1 tablet by mouth 2 (two) times daily. 05/12/23   Rolan Ezra RAMAN, MD  sodium zirconium cyclosilicate  (LOKELMA ) 5 g packet Take 5 g by mouth daily. 02/17/23   Rolan Ezra RAMAN, MD  spironolactone  (ALDACTONE ) 25 MG tablet TAKE 1 TABLET BY MOUTH DAILY 02/07/24   McLean, Dalton S, MD    Allergies: Codeine    Review of Systems  Updated Vital Signs BP 100/73 (BP Location: Right Arm)   Pulse 73   Temp 97.7 F (36.5 C) (Oral)   Resp 15   Ht 1.854 m (6' 1)   Wt 102.1 kg   SpO2 100%   BMI 29.69 kg/m   Physical Exam Vitals and nursing note reviewed.  Constitutional:      Appearance: He is well-developed. He is ill-appearing.  HENT:     Head: Normocephalic and atraumatic.  Eyes:     Conjunctiva/sclera: Conjunctivae normal.  Cardiovascular:     Rate and Rhythm: Normal rate and regular rhythm.  Pulmonary:     Effort: Pulmonary effort is normal.  Breath sounds: Decreased air movement present. No stridor.  Abdominal:     General: There is distension.     Tenderness: There is no abdominal tenderness. There is no guarding.  Skin:    General: Skin is warm and dry.  Neurological:     Mental Status: He is alert and oriented to person, place, and time.     (all labs ordered are listed, but only abnormal results are displayed) Labs Reviewed  CBC  COMPREHENSIVE METABOLIC PANEL WITH GFR  PRO BRAIN NATRIURETIC PEPTIDE  CBG MONITORING, ED  TROPONIN T, HIGH SENSITIVITY    EKG: None  Radiology: No results found.   Procedures   Medications Ordered in the ED - No data to display                                  Medical Decision  Making Patient with known cardiomyopathy CABG, presents with fatigue.  Patient is afebrile, awake and alert, but with concern for worsening heart failure, ACS, NSTEMI, anemia, patient had labs sent, case discussed with his cardiologist after arrival here, plan for admission to our hospitalist colleagues given his substantial decrease in ejection fraction. Patient be transferred to our affiliated care center for cardiology consultation, and medicine admission given his substantial abnormalities.   Amount and/or Complexity of Data Reviewed Independent Historian:     Details: Cardiologist External Data Reviewed: notes. Labs: ordered. Decision-making details documented in ED Course. Radiology: ordered and independent interpretation performed. Decision-making details documented in ED Course.    Details: Pacer defibrillator in place ECG/medicine tests: ordered. Decision-making details documented in ED Course.  Risk Decision regarding hospitalization. Diagnosis or treatment significantly limited by social determinants of health.  Cardiology note regarding echocardiogram today:Echocardiogram today showed LVEF 20 to 25% with RWMA (akinetic anteroseptal wall and apex) and hypokinesis in the remaining segments. No LV thrombus noted by definitive.  2:41 PM Patient's labs most notable for elevated BNP, given his newly identified ejection fraction of 20%, patient has received IV Lasix .  Though there are some concern the patient had an episode of GI bleed earlier in the week, his hemoglobin today is increased from prior.  Patient is on Eliquis , however.  As above, patient will require transfer to our affiliated advanced care center, cardiology aware of and following the patient's case as well.  CRITICAL CARE Performed by: Lamar Salen Total critical care time: 35 minutes Critical care time was exclusive of separately billable procedures and treating other patients. Critical care was necessary to  treat or prevent imminent or life-threatening deterioration. Critical care was time spent personally by me on the following activities: development of treatment plan with patient and/or surrogate as well as nursing, discussions with consultants, evaluation of patient's response to treatment, examination of patient, obtaining history from patient or surrogate, ordering and performing treatments and interventions, ordering and review of laboratory studies, ordering and review of radiographic studies, pulse oximetry and re-evaluation of patient's condition.   Final diagnoses:  Cardiomyopathy, unspecified type [facility])     Salen Lamar, MD 02/23/24 1444  "

## 2024-02-23 NOTE — Hospital Course (Signed)
 38 year old male with a history of HFrEF (EF 30-35%) status post AICD, CAD s/p PCI followed by CABG (LIMA to LAD patent, remaining grafts occluded), paroxysmal atrial fibrillation status post DCCV 2022, intermittent rectal bleeding, LV thrombus, hyperlipidemia, history of tobacco use in remission, and frequent PVCs presenting with orthopnea and worsening dyspnea on exertion over the last 3 to 4 days.  The patient was at the cardiology office on the day of admission getting routine echocardiogram.  On 02/23/2024 echocardiogram showed EF 20-25%, global HK, low normal RVF.  His cardiologist was notified, and the patient was directed to the emergency department for further evaluation and treatment. He states that over the last 3-4 he has had worsening dyspnea on exertion and orthopnea.  He endorses compliance with his medications although he appears to have some poor insight regarding his actual medications.  He states that about 3 days ago he had some chest discomfort that was relieved by nitroglycerin . He states that he had a bloody bowel movement about 3 days prior to this admission.  He states that prior to that he had a bloody bowel movements almost every day for about a week, which was about 2 weeks ago.  He says this has been a chronic issue for him.  He has noted some dizziness but denies any frank syncope.  He denies any worsening lower extremity edema.  He denies any fever, chills, headache, neck pain, abdominal pain, dysuria, hematuria.  In the ED, the patient was afebrile with soft blood pressures.  WBC 9.5, hemoglobin 14.5, platelet 277.  proBNP 668.  Troponin 40.  Sodium 140, potassium 5.0, bicarbonate 19, serum creatinine 1.16.  LFTs were unremarkable.  The patient was given furosemide  40 mg IV x 1.  The patient was seen by cardiology, Dr. Mallipeddi, who recommended transfer to [facility] for advanced heart failure team to consult.

## 2024-02-24 DIAGNOSIS — N179 Acute kidney failure, unspecified: Secondary | ICD-10-CM

## 2024-02-24 DIAGNOSIS — I214 Non-ST elevation (NSTEMI) myocardial infarction: Secondary | ICD-10-CM | POA: Diagnosis not present

## 2024-02-24 DIAGNOSIS — I493 Ventricular premature depolarization: Secondary | ICD-10-CM | POA: Diagnosis not present

## 2024-02-24 DIAGNOSIS — I5023 Acute on chronic systolic (congestive) heart failure: Secondary | ICD-10-CM | POA: Diagnosis not present

## 2024-02-24 LAB — BASIC METABOLIC PANEL WITH GFR
Anion gap: 11 (ref 5–15)
BUN: 26 mg/dL — ABNORMAL HIGH (ref 6–20)
CO2: 25 mmol/L (ref 22–32)
Calcium: 8.8 mg/dL — ABNORMAL LOW (ref 8.9–10.3)
Chloride: 102 mmol/L (ref 98–111)
Creatinine, Ser: 1.58 mg/dL — ABNORMAL HIGH (ref 0.61–1.24)
GFR, Estimated: 56 mL/min — ABNORMAL LOW
Glucose, Bld: 99 mg/dL (ref 70–99)
Potassium: 4.4 mmol/L (ref 3.5–5.1)
Sodium: 137 mmol/L (ref 135–145)

## 2024-02-24 LAB — MAGNESIUM: Magnesium: 2.4 mg/dL (ref 1.7–2.4)

## 2024-02-24 LAB — DIGOXIN LEVEL: Digoxin Level: 1.1 ng/mL (ref 0.8–2.0)

## 2024-02-24 MED ORDER — MORPHINE SULFATE (PF) 2 MG/ML IV SOLN
2.0000 mg | Freq: Once | INTRAVENOUS | Status: AC
  Administered 2024-02-24: 2 mg via INTRAVENOUS
  Filled 2024-02-24: qty 1

## 2024-02-24 MED ORDER — ALUM & MAG HYDROXIDE-SIMETH 200-200-20 MG/5ML PO SUSP
30.0000 mL | Freq: Once | ORAL | Status: AC
  Administered 2024-02-24: 30 mL via ORAL
  Filled 2024-02-24: qty 30

## 2024-02-24 MED ORDER — DICYCLOMINE HCL 10 MG PO CAPS
10.0000 mg | ORAL_CAPSULE | Freq: Once | ORAL | Status: AC
  Administered 2024-02-24: 10 mg via ORAL
  Filled 2024-02-24: qty 1

## 2024-02-24 MED ORDER — ISOSORBIDE MONONITRATE ER 30 MG PO TB24
15.0000 mg | ORAL_TABLET | Freq: Every day | ORAL | Status: DC
  Administered 2024-02-24: 15 mg via ORAL
  Filled 2024-02-24: qty 1

## 2024-02-24 NOTE — Progress Notes (Addendum)
 "  Rounding Note   Patient Name: Wyatt Donovan Date of Encounter: 02/24/2024  Wyatt Donovan Cardiologist: Wyatt Shuck, MD   Subjective Chest pain for the last hour.  Responds to nitroglycerin  but recurs.    Scheduled Meds:  atorvastatin   80 mg Oral Daily   digoxin   0.125 mg Oral Daily   influenza vac split trivalent PF  0.5 mL Intramuscular Tomorrow-1000   mexiletine  150 mg Oral BID   pantoprazole   40 mg Oral BID AC   sodium chloride  flush  3 mL Intravenous Q12H   sodium zirconium cyclosilicate   5 g Oral Daily   Continuous Infusions:  sodium chloride      PRN Meds: sodium chloride , acetaminophen , morphine  injection, nitroGLYCERIN , ondansetron  (ZOFRAN ) IV, polyethylene glycol, sodium chloride  flush   Vital Signs  Vitals:   02/24/24 0008 02/24/24 0132 02/24/24 0300 02/24/24 0810  BP: (!) 82/67 103/72 103/72 105/68  Pulse: 75 65 65 77  Resp:   18 20  Temp:   97.9 F (36.6 C) 98.4 F (36.9 C)  TempSrc:   Oral Oral  SpO2: 99% 100% 94% 94%  Weight:   101.8 kg   Height:   6' 1 (1.854 m)     Intake/Output Summary (Last 24 hours) at 02/24/2024 0852 Last data filed at 02/24/2024 0700 Gross per 24 hour  Intake 240 ml  Output 750 ml  Net -510 ml      02/24/2024    3:00 AM 02/23/2024   12:44 PM 02/11/2024    5:42 AM  Last 3 Weights  Weight (lbs) 224 lb 6.9 oz 225 lb 220 lb  Weight (kg) 101.8 kg 102.059 kg 99.791 kg      Telemetry Sinus rhythm.  Frequent PVCs - Personally Reviewed  ECG  Sinus rhythm.  Rate 69 bpm.  Frequent PVCs. - Personally Reviewed  Physical Exam  VS:  BP 105/68 (BP Location: Left Arm)   Pulse 77   Temp 98.4 F (36.9 C) (Oral)   Resp 20   Ht 6' 1 (1.854 m)   Wt 101.8 kg   SpO2 94%   BMI 29.61 kg/m  , BMI Body mass index is 29.61 kg/m. GENERAL:  Chronically ill-appearing HEENT: Pupils equal round and reactive, fundi not visualized, oral mucosa unremarkable NECK:  No jugular venous distention, waveform within normal  limits, carotid upstroke brisk and symmetric, no bruits, no thyromegaly LUNGS:  Clear to auscultation bilaterally HEART:  RRR.  PMI not displaced or sustained,S1 and S2 within normal limits, no S3, no S4, no clicks, no rubs, no murmurs ABD:  Flat, positive bowel sounds normal in frequency in pitch, no bruits, no rebound, no guarding, no midline pulsatile mass, no hepatomegaly, no splenomegaly EXT:  2 plus pulses throughout, no edema, no cyanosis no clubbing SKIN:  No rashes no nodules NEURO:  Cranial nerves II through XII grossly intact, motor grossly intact throughout PSYCH:  Cognitively intact, oriented to person place and time   Labs High Sensitivity Troponin:  No results for input(s): TROPONINIHS in the last 720 hours.  Recent Labs  Lab 02/23/24 1323 02/23/24 1551 02/23/24 2141  TRNPT 40* 35* 41*       Chemistry Recent Labs  Lab 02/23/24 1323 02/24/24 0258  NA 140 137  K 5.0 4.4  CL 107 102  CO2 19* 25  GLUCOSE 95 99  BUN 21* 26*  CREATININE 1.16 1.58*  CALCIUM  9.3 8.8*  MG  --  2.4  PROT 6.8  --  ALBUMIN  4.1  --   AST 28  --   ALT 30  --   ALKPHOS 88  --   BILITOT 0.7  --   GFRNONAA >60 56*  ANIONGAP 14 11    Lipids No results for input(s): CHOL, TRIG, HDL, LABVLDL, LDLCALC, CHOLHDL in the last 168 hours.  Hematology Recent Labs  Lab 02/23/24 1323  WBC 9.5  RBC 4.71  HGB 14.5  HCT 44.2  MCV 93.8  MCH 30.8  MCHC 32.8  RDW 15.3  PLT 277   Thyroid  No results for input(s): TSH, FREET4 in the last 168 hours.  BNP Recent Labs  Lab 02/23/24 1323  PROBNP 668.0*    DDimer No results for input(s): DDIMER in the last 168 hours.   Radiology  DG Chest Portable 1 View Result Date: 02/23/2024 CLINICAL DATA:  Chest pain EXAM: PORTABLE CHEST 1 VIEW COMPARISON:  October 23, 2022 FINDINGS: Stable cardiomediastinal silhouette. Lungs are clear. Sternotomy wires are noted. Stable bilateral pacemaker placement. Bony thorax is unremarkable.  IMPRESSION: No active disease. Electronically Signed   By: Wyatt [facility] M.D.   On: 02/23/2024 13:32   ECHOCARDIOGRAM COMPLETE Result Date: 02/23/2024    ECHOCARDIOGRAM REPORT   Patient Name:   Wyatt Donovan Date of Exam: 02/23/2024 Medical Rec #:  [id number]        Height:       73.0 in Accession #:    [id number]       Weight:       220.0 lb Date of Birth:  Sep 16, 1985        BSA:          2.241 m Patient Age:    38 years         BP:           82/60 mmHg Patient Gender: M                HR:           79 bpm. Exam Location:  Wy[facility]: 2D Echo, 3D Echo, Cardiac Doppler, Color Doppler, Strain Analysis and            Intracardiac Opacification Agent (Both Spectral and Color Flow            Doppler were utilized during Donovan). Indications:    Congestive Heart Failure l50.9  History:        Patient has prior history of Echocardiogram examinations, most                 recent 02/17/2023. CHF and Cardiomyopathy, CAD and Previous                 Myocardial Infarction, Pacemaker and Defibrillator,                 Arrythmias:Atrial Fibrillation; Risk Factors:Former Smoker and                 Dyslipidemia.  Sonographer:    Wyatt Donovan RCS Referring Phys: [phone] Wyatt Donovan  Sonographer Comments: Global longitudinal strain was attempted. IMPRESSIONS  1. No LV thrombus by Definity . Left ventricular ejection fraction, by estimation, is 20 to 25%. Left ventricular ejection fraction by 3D volume is 23 %. The left ventricle has severely decreased function. The left ventricle demonstrates global hypokinesis. The left ventricular internal cavity size was moderately to severely dilated. Left ventricular diastolic parameters are indeterminate. The average left ventricular global longitudinal strain is -3.9 %. The global longitudinal  strain is abnormal.  2. Right ventricular systolic function is low normal. The right ventricular size is normal. Tricuspid regurgitation signal is inadequate for assessing PA  pressure.  3. The mitral valve is normal in structure. No evidence of mitral valve regurgitation. No evidence of mitral stenosis.  4. The aortic valve was not well visualized. Aortic valve regurgitation is not visualized. No aortic stenosis is present.  5. The inferior vena cava is normal in size with greater than 50% respiratory variability, suggesting right atrial pressure of 3 mmHg. Comparison(s): Changes from prior study are noted. LV function worsened with new RWMA. FINDINGS  Left Ventricle: No LV thrombus by Definity . Left ventricular ejection fraction, by estimation, is 20 to 25%. Left ventricular ejection fraction by 3D volume is 23 %. The left ventricle has severely decreased function. The left ventricle demonstrates global hypokinesis. Definity  contrast agent was given IV to delineate the left ventricular endocardial borders. The average left ventricular global longitudinal strain is -3.9 %. Strain was performed and the global longitudinal strain is abnormal. The left ventricular internal cavity size was moderately to severely dilated. There is no left ventricular hypertrophy. Left ventricular diastolic parameters are indeterminate.  LV Wall Scoring: The entire anterior septum, apical lateral segment, and apex are akinetic. The entire anterior wall, antero-lateral wall, entire inferior wall, posterior wall, mid inferoseptal segment, and basal inferoseptal segment are hypokinetic. Right Ventricle: The right ventricular size is normal. No increase in right ventricular wall thickness. Right ventricular systolic function is low normal. Tricuspid regurgitation signal is inadequate for assessing PA pressure. Left Atrium: Left atrial size was normal in size. Right Atrium: Right atrial size was normal in size. Pericardium: There is no evidence of pericardial effusion. Mitral Valve: The mitral valve is normal in structure. No evidence of mitral valve regurgitation. No evidence of mitral valve stenosis. Tricuspid  Valve: The tricuspid valve is normal in structure. Tricuspid valve regurgitation is not demonstrated. No evidence of tricuspid stenosis. Aortic Valve: The aortic valve was not well visualized. Aortic valve regurgitation is not visualized. No aortic stenosis is present. Pulmonic Valve: The pulmonic valve was not well visualized. Pulmonic valve regurgitation is trivial. No evidence of pulmonic stenosis. Aorta: The aortic root is normal in size and structure. Venous: The inferior vena cava is normal in size with greater than 50% respiratory variability, suggesting right atrial pressure of 3 mmHg. IAS/Shunts: No atrial level shunt detected by color flow Doppler. Additional Comments: 3D was performed not requiring image post processing on an independent workstation and was abnormal.  LEFT VENTRICLE PLAX 2D LVIDd:         6.10 cm         Diastology LVIDs:         5.30 cm         LV e' medial:    7.34 cm/s LV PW:         0.90 cm         LV E/e' medial:  9.3 LV IVS:        0.80 cm         LV e' lateral:   12.20 cm/s LVOT diam:     2.10 cm         LV E/e' lateral: 5.6 LV SV:         33 LV SV Index:   15              2D Longitudinal LVOT Area:     3.46 cm  Strain                                2D Strain GLS   -3.9 %                                Avg: LV Volumes (MOD) LV vol d, MOD    110.0 ml      3D Volume EF A2C:                           LV 3D EF:    Left LV vol d, MOD    143.0 ml                   ventricul A4C:                                        ar LV vol s, MOD    92.8 ml                    ejection A2C:                                        fraction LV vol s, MOD    110.0 ml                   by 3D A4C:                                        volume is LV SV MOD A2C:   17.2 ml                    23 %. LV SV MOD A4C:   143.0 ml LV SV MOD BP:    28.2 ml                                3D Volume EF:                                3D EF:        23 %                                LV EDV:       181 ml                                 LV ESV:       139 ml                                LV SV:        42 ml RIGHT VENTRICLE RV S prime:     7.21 cm/s TAPSE (M-mode): 1.6 cm LEFT ATRIUM  Index        RIGHT ATRIUM           Index LA diam:      3.20 cm 1.43 cm/m   RA Area:     12.20 cm LA Vol (A2C): 43.4 ml 19.37 ml/m  RA Volume:   27.50 ml  12.27 ml/m LA Vol (A4C): 60.7 ml 27.09 ml/m  AORTIC VALVE LVOT Vmax:   58.00 cm/s LVOT Vmean:  41.800 cm/s LVOT VTI:    0.095 m  AORTA Ao Root diam: 3.50 cm MITRAL VALVE MV Area (PHT): 2.09 cm    SHUNTS MV Decel Time: 363 msec    Systemic VTI:  0.10 m MR Peak grad: 42.5 mmHg    Systemic Diam: 2.10 cm MR Mean grad: 29.0 mmHg MR Vmax:      326.00 cm/s MR Vmean:     255.0 cm/s MV E velocity: 68.00 cm/s MV A velocity: 24.60 cm/s MV E/A ratio:  2.76 Vishnu Priya Mallipeddi Electronically signed by Diannah Late Mallipeddi Signature Date/Time: 02/23/2024/1:31:37 PM    Final     Cardiac Studies Echo 02/23/24: IMPRESSIONS     1. No LV thrombus by Definity . Left ventricular ejection fraction, by  estimation, is 20 to 25%. Left ventricular ejection fraction by 3D volume  is 23 %. The left ventricle has severely decreased function. The left  ventricle demonstrates global  hypokinesis. The left ventricular internal cavity size was moderately to  severely dilated. Left ventricular diastolic parameters are indeterminate.  The average left ventricular global longitudinal strain is -3.9 %. The  global longitudinal strain is  abnormal.   2. Right ventricular systolic function is low normal. The right  ventricular size is normal. Tricuspid regurgitation signal is inadequate  for assessing PA pressure.   3. The mitral valve is normal in structure. No evidence of mitral valve  regurgitation. No evidence of mitral stenosis.   4. The aortic valve was not well visualized. Aortic valve regurgitation  is not visualized. No aortic stenosis is present.   5. The inferior vena cava is  normal in size with greater than 50%  respiratory variability, suggesting right atrial pressure of 3 mmHg.   Echo 01/2023: 1. Global hypokinesis worse in the inferior wall . Left ventricular  ejection fraction, by estimation, is 30 to 35%. The left ventricle has  moderately decreased function. The left ventricle demonstrates global  hypokinesis. Left ventricular diastolic  parameters were normal.   2. Right ventricular systolic function is normal. The right ventricular  size is normal.   3. The mitral valve is abnormal. Mild mitral valve regurgitation. No  evidence of mitral stenosis.   4. The aortic valve is tricuspid. There is mild calcification of the  aortic valve. There is mild thickening of the aortic valve. Aortic valve  regurgitation is not visualized. Aortic valve sclerosis is present, with  no evidence of aortic valve stenosis.   5. The inferior vena cava is normal in size with greater than 50%  respiratory variability, suggesting right atrial pressure of 3 mmHg.   RHC 02/11/24:  Right Heart Pressures RHC Procedural Findings: Hemodynamics (mmHg) RA mean 6 RV 30/8 PA 38/11, mean 19 PCWP mean 16  Oxygen saturations: PA 63% AO 96%  Cardiac Output (Fick) 4.52  Cardiac Index (Fick) 2.02 PVR < 1 WU  PAPi 4.5   1. Normal RA pressure and mildly elevated PCWP.  2. Mild pulmonary venous hypertension.  3. Low cardiac output, CI 2.02 by Fick.  Cardiac MRI 11/2020:   IMPRESSION: 1. Subendocardial late gadolinium enhancement consistent with prior infarct in basal to mid inferior/inferoseptal/inferolateral/anterolateral walls, apical lateral/septal walls, and apex. LGE is greater than 50% transmural suggesting nonviability in basal inferior/inferoseptal/inferolateral/anterolateral walls, mid inferior/inferolateral/anterolateral walls, apical lateral wall, and distal apex.   2.  Normal LV size with severe systolic dysfunction (EF 29%)   3.  Small RV size with  mild systolic dysfunction (EF 45%)   4.  No LV thrombus seen  LHC 11/2020:   Prox LAD to Mid LAD lesion is 100% stenosed.   Lat 1st Diag lesion is 100% stenosed.   Prox Cx to Mid Cx lesion is 99% stenosed.   Ost RCA to Prox RCA lesion is 100% stenosed.   Mid LAD to Dist LAD lesion is 50% stenosed.   Origin to Prox Graft lesion is 100% stenosed.   Origin lesion is 100% stenosed.   Origin lesion is 100% stenosed.   A drug-eluting stent was successfully placed using a STENT ONYX FRONTIER 3.0X15.   Post intervention, there is a 0% residual stenosis.   LIMA and is normal in caliber.   Right radial artery graft was visualized by angiography.   SVG graft was visualized by angiography.   The graft exhibits no disease.   LV end diastolic pressure is normal.   Left dominant circulation Occluded LAD at prior stent site Patent LIMA to the LAD All other grafts are occluded including SVG to diagonal, radial to OM, and SVG to PDA Low LVEDP 2 mm Hg Successful PCI of the native mid LCx with DES x 1   Plan: DAPT for one year. Hold CHF meds now due to low BP and low EDP. Will hydrate.     Patient Profile   37 y.o. male with CAD s/p PCI and CABG, HFrEF, s/p BSci ICD, frequent PVCs, prior LV thrombus, atrial fibrillation/flutter, and rectal bleeding here with acute on chronic HFrEF and rectal beeding.    Assessment & Plan   # Acute on chronic HFrEF:  Pro-BNP 668.  LVEF reduced to 20-25% this admission.  It has mostly ranged 25-35% recently.  He doesn't appear volume overloaded and renal function worsened with attempt at IV lasix .  Hold diuretics today and resume oral tomorrow if renal function is stable.  Given his chest pain, frequent PVCs and worsening LVEF, he will likely need LHC, though based on his last cath, options may be limited.  Needs advanced HF and heart team discussion.  Would also need clearance from GI given his rectal bleeding.  Continue digoxin  and add Imdur  30 mg daily. Undergoing  outpatient VAD/transplant evaluation.   # CAD s/p CABG/PCI: # NSTEMI:  LIMA to LAD is patent.  SVG to LCX, OM, and diagonal are all occluded.  RCA occluded proximally and the myocardium in the  basal inferior/inferoseptal/inferolateral/anterolateral walls, mid inferior/inferolateral/anterolateral walls, apical lateral wall, and distal apex is nonviable.  Consider LHC as above.  Add Imdur  30mg  for now.  His pain is atypical, as it is also ilicited with chest wall palpation.  Agree with holding Eliquis  and starting heparin .    # AKI:  Worsening renal function with attempt at diuresis.  Creatinine increased from 1.16 to 1.58.  Hold diuretics as above.  # PVCs:  22% PVC burden on event monitor.  Admitting cardiologist planned to switch his carvedilol  to metoprolol , but BP has not allows this.  Continue mexiletine.  ?cath as above.     PAF: History of DCCV.  Now  in sinus rhythm.  Continue digoxin .  Holding Eliquis  for heparin  given possible need for cath.   For questions or updates, please contact [place] [facility] Please consult [email or web address] for contact info under       Signed, Annabella Scarce, MD  02/24/2024, 8:52 AM    "

## 2024-02-24 NOTE — Plan of Care (Signed)

## 2024-02-24 NOTE — Progress Notes (Signed)
 " PROGRESS NOTE    Wyatt Donovan  FMW:[id number] DOB: [birth year] DOA: 02/23/2024 PCP: Orpha Yancey LABOR, MD   Brief Narrative:  Wyatt Donovan is a 38 year old male with a history of HFrEF (EF 30-35%) status post AICD, CAD s/p PCI followed by CABG (LIMA to LAD patent, remaining grafts occluded), paroxysmal atrial fibrillation status post DCCV 2022, intermittent rectal bleeding, LV thrombus, hyperlipidemia, history of tobacco use in remission, and frequent PVCs presenting with worsening dyspnea approximately 3 to 4 days prior to admission.    Repeat echo shows depressed EF at 20 to 25% with global hypokinesia and low normal RVEF concerning for worsening status prompting visit to the hospital.  Patient transferred to [facility] for further evaluation and treatment.  Patient also has transient episodes of bleeding with bowel movements, notes bright red blood, also indicates this is consistent with his prior history of hemorrhoids.  He has not had a bloody bowel movement approximately 1 week prior to admission.   Assessment & Plan:   Principal Problem:   Acute on chronic HFrEF (heart failure with reduced ejection fraction) (HCC) Active Problems:   Coronary artery disease   Hypercholesterolemia   Acute on chronic HFrEF - 02/23/2024 echo EF 20-25%, global HK, low normal RVF - CVP was 3 mmHg on echo - Cardiology consulted recommending holding further diuretics in setting of mild increase of creatinine - Tentative plan for left heart cath per cardiology - Borderline hypotension and likely to continue limiting GDMT - Currently holding furosemide , carvedilol , spironolactone  and Entresto  -defer to cardiology for further management - Currently on digoxin , mexiletine -isosorbide  added 12/25  Rectal bleeding, history of internal hemorrhoids - Appears to be self-limiting and intermittent, hemoglobin at baseline around 14 - No indication for GI consult at this time, if worsening hemoglobin or  rectal bleeding would reach out to patient's established GI team, [place] with Dr. Federico 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,followed by CABG  x 4 4/22  - Plavix  discontinued in 2023; left heart cath being considered as above   Hyperlipidemia - Continue statin  DVT prophylaxis: SCDs Start: 02/23/24 1814 Code Status:   Code Status: Full Code Family Communication: None present  Status is: Inpatient  Dispo: The patient is from: Home              Anticipated d/c is to: Home              Anticipated d/c date is: Pending cardiac procedure timing              Patient currently not medically stable for discharge  Consultants:  Cardiology  Procedures:  Left heart cath pending as above  Antimicrobials:  None indicated  Subjective: No acute issues or events overnight denies nausea vomiting diarrhea constipation headache fevers chills or chest pain.  Objective: Vitals:   02/23/24 2351 02/24/24 0008 02/24/24 0132 02/24/24 0300  BP: (!) 87/60 (!) 82/67 103/72 103/72  Pulse:  75 65 65  Resp:  18  Temp:    97.9 F (36.6 C)  TempSrc:    Oral  SpO2:  99% 100% 94%  Weight:    101.8 kg  Height:    6' 1 (1.854 m)    Intake/Output Summary (Last 24 hours) at 02/24/2024 0709 Last data filed at 02/24/2024 0200 Gross per 24 hour  Intake --  Output 550 ml  Net -550 ml   Filed Weights   02/23/24 1244 02/24/24 0300  Weight: 102.1  kg 101.8 kg    Examination:  General:  Pleasantly resting in bed, No acute distress. HEENT:  Normocephalic atraumatic.  Sclerae nonicteric, noninjected.  Extraocular movements intact bilaterally. Neck:  Without mass or deformity.  Trachea is midline. Lungs:  Clear to auscultate bilaterally without rhonchi, wheeze, or rales. Heart:  Regular rate and rhythm.  Without murmurs, rubs, or gallops. Abdomen:  Soft, obese, nontender, nondistended.  Without guarding or rebound. Extremities: Without cyanosis, clubbing, edema, or obvious deformity. Skin:  Warm and dry, no erythema.  Data Reviewed: I have personally reviewed following labs and imaging studies  CBC: Recent Labs  Lab 02/23/24 1323  WBC 9.5  HGB 14.5  HCT 44.2  MCV 93.8  PLT 277   Basic Metabolic Panel: Recent Labs  Lab 02/23/24 1323 02/24/24 0258  NA 140 137  K 5.0 4.4  CL 107 102  CO2 19* 25  GLUCOSE 95 99  BUN 21* 26*  CREATININE 1.16 1.58*  CALCIUM  9.3 8.8*  MG  --  2.4   GFR: Estimated Creatinine Clearance: 78 mL/min (A) (by C-G formula based on SCr of 1.58 mg/dL (H)). Liver Function Tests: Recent Labs  Lab 02/23/24 1323  AST 28  ALT 30  ALKPHOS 88  BILITOT 0.7  PROT 6.8  ALBUMIN  4.1   No results for input(s): LIPASE, AMYLASE in the last 168 hours. No results for input(s): AMMONIA in the last 168 hours. Coagulation Profile: No results for input(s): INR, PROTIME in the last 168 hours. Cardiac Enzymes: No results for input(s): CKTOTAL, CKMB, CKMBINDEX, TROPONINI in the last 168 hours. BNP (last 3 results) Recent Labs    02/23/24 1323  PROBNP 668.0*   HbA1C: No results for input(s): HGBA1C in the last 72 hours. CBG: Recent Labs  Lab 02/23/24 1333  GLUCAP 94   Lipid Profile: No results for input(s): CHOL, HDL, LDLCALC, TRIG, CHOLHDL, LDLDIRECT in the last 72 hours. Thyroid  Function Tests: No results for input(s): TSH, T4TOTAL, FREET4, T3FREE,  THYROIDAB in the last 72 hours. Anemia Panel: No results for input(s): VITAMINB12, FOLATE, FERRITIN, TIBC, IRON, RETICCTPCT in the last 72 hours. Sepsis Labs: No results for input(s): PROCALCITON, LATICACIDVEN in the last 168 hours.  No results found for this or any previous visit (from the past 240 hours).       Radiology Studies: DG Chest Portable 1 View Result Date: 02/23/2024 CLINICAL DATA:  Chest pain EXAM: PORTABLE CHEST 1 VIEW COMPARISON:  October 23, 2022 FINDINGS: Stable cardiomediastinal silhouette. Lungs are clear. Sternotomy wires are noted. Stable bilateral pacemaker placement. Bony thorax is unremarkable. IMPRESSION: No active disease. Electronically Signed   By: Lynwood Landy Raddle M.D.   On: 02/23/2024 13:32   ECHOCARDIOGRAM COMPLETE Result Date: 02/23/2024    ECHOCARDIOGRAM REPORT   Patient Name:   Wyatt Donovan Date of Exam: 02/23/2024 Medical Rec #:  [id number]        Height:       73.0 in Accession #:    [id number]  Weight:       220.0 lb Date of Birth:  1985-07-28        BSA:          2.241 m Patient Age:    38 years         BP:           82/60 mmHg Patient Gender: M                HR:           79 bpm. Exam Location:  [facility] Procedure: 2D Echo, 3D Echo, Cardiac Doppler, Color Doppler, Strain Analysis and            Intracardiac Opacification Agent (Both Spectral and Color Flow            Doppler were utilized during procedure). Indications:    Congestive Heart Failure l50.9  History:        Patient has prior history of Echocardiogram examinations, most                 recent 02/17/2023. CHF and Cardiomyopathy, CAD and Previous                 Myocardial Infarction, Pacemaker and Defibrillator,                 Arrythmias:Atrial Fibrillation; Risk Factors:Former Smoker and                 Dyslipidemia.  Sonographer:    Aida Pizza RCS Referring Phys: [phone] EZRA GORMAN SHUCK  Sonographer Comments: Global longitudinal strain was attempted. IMPRESSIONS  1.  No LV thrombus by Definity . Left ventricular ejection fraction, by estimation, is 20 to 25%. Left ventricular ejection fraction by 3D volume is 23 %. The left ventricle has severely decreased function. The left ventricle demonstrates global hypokinesis. The left ventricular internal cavity size was moderately to severely dilated. Left ventricular diastolic parameters are indeterminate. The average left ventricular global longitudinal strain is -3.9 %. The global longitudinal strain is abnormal.  2. Right ventricular systolic function is low normal. The right ventricular size is normal. Tricuspid regurgitation signal is inadequate for assessing PA pressure.  3. The mitral valve is normal in structure. No evidence of mitral valve regurgitation. No evidence of mitral stenosis.  4. The aortic valve was not well visualized. Aortic valve regurgitation is not visualized. No aortic stenosis is present.  5. The inferior vena cava is normal in size with greater than 50% respiratory variability, suggesting right atrial pressure of 3 mmHg. Comparison(s): Changes from prior study are noted. LV function worsened with new RWMA. FINDINGS  Left Ventricle: No LV thrombus by Definity . Left ventricular ejection fraction, by estimation, is 20 to 25%. Left ventricular ejection fraction by 3D volume is 23 %. The left ventricle has severely decreased function. The left ventricle demonstrates global hypokinesis. Definity  contrast agent was given IV to delineate the left ventricular endocardial borders. The average left ventricular global longitudinal strain is -3.9 %. Strain was performed and the global longitudinal strain is abnormal. The left ventricular internal cavity size was moderately to severely dilated. There is no left ventricular hypertrophy. Left ventricular diastolic parameters are indeterminate.  LV Wall Scoring: The entire anterior septum, apical lateral segment, and apex are akinetic. The entire anterior wall, antero-lateral  wall, entire inferior wall, posterior wall, mid inferoseptal segment, and basal inferoseptal segment are hypokinetic. Right Ventricle: The right ventricular size is normal. No increase in right ventricular wall thickness. Right  ventricular systolic function is low normal. Tricuspid regurgitation signal is inadequate for assessing PA pressure. Left Atrium: Left atrial size was normal in size. Right Atrium: Right atrial size was normal in size. Pericardium: There is no evidence of pericardial effusion. Mitral Valve: The mitral valve is normal in structure. No evidence of mitral valve regurgitation. No evidence of mitral valve stenosis. Tricuspid Valve: The tricuspid valve is normal in structure. Tricuspid valve regurgitation is not demonstrated. No evidence of tricuspid stenosis. Aortic Valve: The aortic valve was not well visualized. Aortic valve regurgitation is not visualized. No aortic stenosis is present. Pulmonic Valve: The pulmonic valve was not well visualized. Pulmonic valve regurgitation is trivial. No evidence of pulmonic stenosis. Aorta: The aortic root is normal in size and structure. Venous: The inferior vena cava is normal in size with greater than 50% respiratory variability, suggesting right atrial pressure of 3 mmHg. IAS/Shunts: No atrial level shunt detected by color flow Doppler. Additional Comments: 3D was performed not requiring image post processing on an independent workstation and was abnormal.  LEFT VENTRICLE PLAX 2D LVIDd:         6.10 cm         Diastology LVIDs:         5.30 cm         LV e' medial:    7.34 cm/s LV PW:         0.90 cm         LV E/e' medial:  9.3 LV IVS:        0.80 cm         LV e' lateral:   12.20 cm/s LVOT diam:     2.10 cm         LV E/e' lateral: 5.6 LV SV:         33 LV SV Index:   15              2D Longitudinal LVOT Area:     3.46 cm        Strain                                2D Strain GLS   -3.9 %                                Avg: LV Volumes (MOD) LV vol d,  MOD    110.0 ml      3D Volume EF A2C:                           LV 3D EF:    Left LV vol d, MOD    143.0 ml                   ventricul A4C:                                        ar LV vol s, MOD    92.8 ml                    ejection A2C:  fraction LV vol s, MOD    110.0 ml                   by 3D A4C:                                        volume is LV SV MOD A2C:   17.2 ml                    23 %. LV SV MOD A4C:   143.0 ml LV SV MOD BP:    28.2 ml                                3D Volume EF:                                3D EF:        23 %                                LV EDV:       181 ml                                LV ESV:       139 ml                                LV SV:        42 ml RIGHT VENTRICLE RV S prime:     7.21 cm/s TAPSE (M-mode): 1.6 cm LEFT ATRIUM           Index        RIGHT ATRIUM           Index LA diam:      3.20 cm 1.43 cm/m   RA Area:     12.20 cm LA Vol (A2C): 43.4 ml 19.37 ml/m  RA Volume:   27.50 ml  12.27 ml/m LA Vol (A4C): 60.7 ml 27.09 ml/m  AORTIC VALVE LVOT Vmax:   58.00 cm/s LVOT Vmean:  41.800 cm/s LVOT VTI:    0.095 m  AORTA Ao Root diam: 3.50 cm MITRAL VALVE MV Area (PHT): 2.09 cm    SHUNTS MV Decel Time: 363 msec    Systemic VTI:  0.10 m MR Peak grad: 42.5 mmHg    Systemic Diam: 2.10 cm MR Mean grad: 29.0 mmHg MR Vmax:      326.00 cm/s MR Vmean:     255.0 cm/s MV E velocity: 68.00 cm/s MV A velocity: 24.60 cm/s MV E/A ratio:  2.76 Vishnu Priya Mallipeddi Electronically signed by Diannah Late Mallipeddi Signature Date/Time: 02/23/2024/1:31:37 PM    Final         Scheduled Meds:  atorvastatin   80 mg Oral Daily   digoxin   0.125 mg Oral Daily   influenza vac split trivalent PF  0.5 mL Intramuscular Tomorrow-1000   mexiletine  150 mg Oral BID   pantoprazole   40 mg Oral BID AC   sodium chloride  flush  3 mL Intravenous Q12H   sodium zirconium cyclosilicate   5  g Oral Daily   Continuous Infusions:  sodium chloride         LOS: 1 day   Time spent:  Elsie JAYSON Montclair, DO [facility]  If 7PM-7AM, please contact night-coverage [email or web address]  02/24/2024, 7:09 AM      "

## 2024-02-25 ENCOUNTER — Other Ambulatory Visit: Payer: Self-pay

## 2024-02-25 DIAGNOSIS — I493 Ventricular premature depolarization: Secondary | ICD-10-CM | POA: Diagnosis not present

## 2024-02-25 DIAGNOSIS — N179 Acute kidney failure, unspecified: Secondary | ICD-10-CM | POA: Diagnosis not present

## 2024-02-25 DIAGNOSIS — I214 Non-ST elevation (NSTEMI) myocardial infarction: Secondary | ICD-10-CM | POA: Diagnosis not present

## 2024-02-25 DIAGNOSIS — I5023 Acute on chronic systolic (congestive) heart failure: Secondary | ICD-10-CM | POA: Diagnosis not present

## 2024-02-25 LAB — BASIC METABOLIC PANEL WITH GFR
Anion gap: 10 (ref 5–15)
BUN: 24 mg/dL — ABNORMAL HIGH (ref 6–20)
CO2: 25 mmol/L (ref 22–32)
Calcium: 8.9 mg/dL (ref 8.9–10.3)
Chloride: 100 mmol/L (ref 98–111)
Creatinine, Ser: 1.19 mg/dL (ref 0.61–1.24)
GFR, Estimated: >60 mL/min
Glucose, Bld: 101 mg/dL — ABNORMAL HIGH (ref 70–99)
Potassium: 4.6 mmol/L (ref 3.5–5.1)
Sodium: 135 mmol/L (ref 135–145)

## 2024-02-25 MED ORDER — SPIRONOLACTONE 12.5 MG HALF TABLET
12.5000 mg | ORAL_TABLET | Freq: Every day | ORAL | Status: DC
  Administered 2024-02-25: 12.5 mg via ORAL
  Filled 2024-02-25 (×2): qty 1

## 2024-02-25 MED ORDER — SPIRONOLACTONE 25 MG PO TABS: 25.0000 mg | ORAL_TABLET | Freq: Every day | ORAL | Status: DC

## 2024-02-25 MED ORDER — FUROSEMIDE 20 MG PO TABS
20.0000 mg | ORAL_TABLET | Freq: Every day | ORAL | Status: DC
  Administered 2024-02-25 – 2024-02-29 (×5): 20 mg via ORAL
  Filled 2024-02-25 (×3): qty 1

## 2024-02-25 MED ORDER — MEXILETINE HCL 200 MG PO CAPS
200.0000 mg | ORAL_CAPSULE | Freq: Two times a day (BID) | ORAL | Status: DC
  Administered 2024-02-25 – 2024-03-06 (×19): 200 mg via ORAL
  Filled 2024-02-25 (×9): qty 1

## 2024-02-25 MED ORDER — DAPAGLIFLOZIN PROPANEDIOL 10 MG PO TABS
10.0000 mg | ORAL_TABLET | Freq: Every day | ORAL | Status: DC
  Administered 2024-02-25 – 2024-03-06 (×10): 10 mg via ORAL
  Filled 2024-02-25 (×4): qty 1

## 2024-02-25 MED ORDER — ALBUTEROL SULFATE (2.5 MG/3ML) 0.083% IN NEBU: 2.5000 mg | INHALATION_SOLUTION | Freq: Four times a day (QID) | RESPIRATORY_TRACT | Status: DC | PRN

## 2024-02-25 NOTE — Plan of Care (Signed)

## 2024-02-25 NOTE — Progress Notes (Signed)
"  ° °      CROSS COVER NOTE  NAME: Wyatt Donovan MRN: [id number] DOB : October 24, 1985    Concern as stated by nurse / staff   hello patient is complaining of chest pain rating pain 10, I am getting an EKG done right now, also just gave them 1st dose of nitroglycerin , BP: 124/89 MAP 101      Pertinent findings on chart review: Acute on chronic HFrEF - 02/23/2024 echo EF 20-25%, global HK, low normal RVF Obstructive coronary disease Early onset CAD s/p MI with LAD PCI in 2012,followed by CABG  x 4 4/22  - Plavix  discontinued in 2023; left heart cath being considered as above  Patient Assessment     Assessment and  Interventions   Assessment:  Recurring chest pain  Plan: Continue NTG and morphine  EKG non acute Trop --> Continue current mgmt: Cards note from today reviewed       "

## 2024-02-25 NOTE — Progress Notes (Signed)
 VAD coordinator met with pt at bedside to discuss advanced therapies. Pt tells me he doesn't smoke, vape, drink or do any illicit drugs. He has had previous CABG. He lives with his dad who is able to transport him to/from appts. Pt tells me that he has applied for disability and has a lawyer helping him with this. He has active medicaid. D/w Dr Rolan, transplant packet sent to [facility] for heart transplant evaluation. VAD coordinator discussed this with pt who would also prefer transplant over VAD. But with pt being young and drug/smoke free transplant is probably the better option for him. Pt was informed that if he is turned down for transplant or decompensates we can work him up for VAD here at [facility]. He is agreeable.  Lauraine Ip RN, BSN VAD Coordinator 24/7 Pager [phone]

## 2024-02-25 NOTE — Progress Notes (Signed)
 Heart Failure Navigator Progress Note  Assessed for Heart & Vascular TOC clinic readiness.  Patient does not meet criteria due to the Advanced Heart Failure Team was consulted. .   Navigator will sign off at this time.   Stephane Haddock, BSN, Scientist, Clinical (histocompatibility And Immunogenetics) Only

## 2024-02-25 NOTE — Progress Notes (Addendum)
 "   Advanced Heart Failure Team Consult Note  Primary Physician: Orpha Yancey LABOR, MD Cardiologist:  Ezra Shuck, MD HPI:    Wyatt Donovan is seen today for evaluation of acute on chronic HFrEF at the request of Dr. Raford.   Mathews L Hommel is a 38 y.o. male with history of early onset CAD s/p CABG, ischemic cardiomyopathy, and LV thrombus. Patient had initial MI in 2012 at age 56, PCI to LAD.  He had inferoposterior MI in 4/22.  LHC showed 3 vessel disease, and patient had CABG x 4. Cardiac MRI in 5/22 showed LV EF 27% with LV thrombus, there was significant viability.  Post-op, patient had GI bleeding and anticoagulation was stopped. He quit smoking after CABG.    Follow up 8/22 carvedilol  decreased due to dizziness and repeat echo arranged.  Echo 8/22 EF 25-30%. He was referred to EP for ICD consideration > Environmental Manager ICD.   Admitted 10/22 with NSTEMI. Coronary angiogram showed patent LIMA to LAD with occlusion of all other grafts. S/p PCI/DES to native mid LCX 99% stenosis.  Native LAD and RCA occluded. Angina resolved post PCI. Farxiga  and Coreg  added back, however spiro and losartan  held due to hyperkalemia during this admission. His hospitalization was complicated by atrial fibrillation and flutter with RVR. He received IV amiodarone  and started on Eliquis .  He underwent DCCV with conversion to NSR on 12/06/20. Discharged home 12/06/20, weight 177.5 lbs.   In 2/24, patient had cardiac contracility modulator placement.    Patient presented to [facility] in 12/24 with 2-3 months of RUQ pain, worse x 2-3 weeks.  Abdominal CT showed a stone in the cystic duct with mildly distended gallbladder but no evidence for cholecystitis.  He was discharged from the ER but told that he will need cholecystectomy.    Echo 01/2023 showed EF 30-35%, global hypokinesis, mild RV dysfunction, IVC not dilated.    Underwent cath 01/2023 showing normal filling pressures, low cardiac  output with CI 2 by Fick,1.82 thermodilution. Coronary anatomy was unchanged.  Grafts all occluded (known prior) except LIMA-LAD.  Occluded native RCA (known from prior).  Patent mid LCx stent. Post cath he was started on digoxin  0.125 mg.    He was admitted in 1/25 with RUL PNA and treated with antibiotics.  He continued to have abdominal pain, but this time abdominal US  showed no gallstones and HIDA scan was negative.   Zio 02/22/24: avg HR 73 bpm, predominantly VP, frequent PVCs 14%. Echo 02/23/24 EF 23%, no LV thrombus, nl RV.   Presented to [facility] 02[facility]23[facility] with dyspnea and reports of bloody bowel movements. In ED, BP 80/60s, HR in 80s, afebrile. Labs notable for hgb 14.5, plt 227, proBNP 668, hs-trop 40, K 5.0, CO2 19, Cr 1.16. ECG showed NSR with 1AVB and PVCs. He was given IV lasix  and cardiology/HF consulted.   Continues to have SOB this morning. Describes chest pain as pressure that comes and goes. Slept lying flat last night.   Home Medications Prior to Admission medications  Medication Sig Start Date End Date Taking? Authorizing Provider  acetaminophen  (TYLENOL ) 500 MG tablet Take 500 mg by mouth every 6 (six) hours as needed for moderate pain (pain score 4-6) or headache.   Yes [provider]  albuterol  (VENTOLIN  HFA) 108 (90 Base) MCG/ACT inhaler Inhale 2 puffs into the lungs 3 (three) times a week. for wheezing 11/12/23  Yes [provider]  apixaban  (ELIQUIS ) 5 MG TABS tablet Take 1  tablet (5 mg total) by mouth 2 (two) times daily. 05/12/23  Yes Rolan Ezra RAMAN, MD  atorvastatin  (LIPITOR ) 80 MG tablet TAKE 1 TABLET BY MOUTH DAILY 01/19/24  Yes McLean, Dalton S, MD  carvedilol  (COREG ) 6.25 MG tablet TAKE 1 TABLET BY MOUTH TWICE DAILY WITH A MEAL 01/31/24  Yes Rolan Ezra RAMAN, MD  dapagliflozin  propanediol (FARXIGA ) 10 MG TABS tablet Take 1 tablet (10 mg total) by mouth daily before breakfast. 05/27/23  Yes Rolan Ezra RAMAN, MD  digoxin  (LANOXIN ) 0.125 MG tablet Take 1  tablet (0.125 mg total) by mouth daily. 02/11/24 02/10/25 Yes Rolan Ezra RAMAN, MD  furosemide  (LASIX ) 20 MG tablet TAKE 1 TABLET BY MOUTH DAILY 01/19/24  Yes McLean, Dalton S, MD  mexiletine (MEXITIL ) 150 MG capsule Take 1 capsule (150 mg total) by mouth 2 (two) times daily. 02/11/24  Yes Rolan Ezra RAMAN, MD  nitroGLYCERIN  (NITROSTAT ) 0.4 MG SL tablet Place 1 tablet (0.4 mg total) under the tongue every 5 (five) minutes x 3 doses as needed for chest pain. 01/27/22  Yes Rolan Ezra RAMAN, MD  Omega-3 Fatty Acids (OMEGA 3 500 PO) Take 500 mg by mouth daily.   Yes [provider]  pantoprazole  (PROTONIX ) 40 MG tablet TAKE 1 TABLET BY MOUTH TWICE DAILY BEFORE MEALS 12/20/23  Yes Collier, Amanda R, PA-C  polyethylene glycol (MIRALAX  / GLYCOLAX ) 17 g packet Take 17 g by mouth daily as needed for mild constipation or moderate constipation.   Yes [provider]  sacubitril -valsartan  (ENTRESTO ) 24-26 MG Take 1 tablet by mouth 2 (two) times daily. 05/12/23  Yes Rolan Ezra RAMAN, MD  sodium zirconium cyclosilicate  (LOKELMA ) 5 g packet Take 5 g by mouth daily. 02/17/23  Yes Rolan Ezra RAMAN, MD  spironolactone  (ALDACTONE ) 25 MG tablet TAKE 1 TABLET BY MOUTH DAILY 02/07/24  Yes Rolan Ezra RAMAN, MD    Past Medical History: Past Medical History:  Diagnosis Date   Atrial fibrillation [facility])    Atypical chest pain    CAD (coronary artery disease)    CHF (congestive heart failure) (HCC)    Dyslipidemia    Ex-smoker    History of depression    Hyperlipidemia    Insomnia    Ischemic cardiomyopathy    Ejection fraction 40-45%   NSTEMI (non-ST elevated myocardial infarction) (HCC) 08/01/2007   Treated with a bare metal stent to the proximal LAD, pt states MI x3   Suicide attempt (HCC) 01/30/2001    Past Surgical History: Past Surgical History:  Procedure Laterality Date   ADENOIDECTOMY     CARDIOVERSION N/A 12/06/2020   Procedure: CARDIOVERSION;  Surgeon: Rolan Ezra RAMAN, MD;   Location: [facility] ENDOSCOPY;  Service: Cardiovascular;  Laterality: N/A;   CCM GENERATOR AND A/V LEAD INSERTION N/A 04/17/2022   Procedure: CCM GENERATOR AND A/V LEAD INSERTION;  Surgeon: Cindie Ole DASEN, MD;  Location: [facility] INVASIVE CV LAB;  Service: Cardiovascular;  Laterality: N/A;   COLONOSCOPY     CORONARY ARTERY BYPASS GRAFT N/A 07/03/2020   Procedure: CORONARY ARTERY BYPASS GRAFTING (CABG) times four using left internal mammary artery, right arm radial artery and right leg saphenous vein;  Surgeon: Shyrl Linnie KIDD, MD;  Location: [facility] OR;  Service: Open Heart Surgery;  Laterality: N/A;   CORONARY STENT INTERVENTION N/A 12/03/2020   Procedure: CORONARY STENT INTERVENTION;  Surgeon: Jordan, Peter M, MD;  Location: [facility] INVASIVE CV LAB;  Service: Cardiovascular;  Laterality: N/A;   CORONARY STENT PLACEMENT     Bare metal stent  to proximal LAD   ESOPHAGOGASTRODUODENOSCOPY (EGD) WITH PROPOFOL  N/A 06/24/2023   Procedure: ESOPHAGOGASTRODUODENOSCOPY (EGD) WITH PROPOFOL ;  Surgeon: Federico Rosario BROCKS, MD;  Location: [facility] ENDOSCOPY;  Service: Gastroenterology;  Laterality: N/A;   ICD IMPLANT N/A 02/10/2021   Procedure: ICD IMPLANT;  Surgeon: Cindie Ole DASEN, MD;  Location: [facility] INVASIVE CV LAB;  Service: Cardiovascular;  Laterality: N/A;   LEFT HEART CATH AND CORONARY ANGIOGRAPHY N/A 07/01/2020   Procedure: LEFT HEART CATH AND CORONARY ANGIOGRAPHY;  Surgeon: Court Dorn PARAS, MD;  Location: [facility] INVASIVE CV LAB;  Service: Cardiovascular;  Laterality: N/A;   LEFT HEART CATH AND CORS/GRAFTS ANGIOGRAPHY N/A 12/03/2020   Procedure: LEFT HEART CATH AND CORS/GRAFTS ANGIOGRAPHY;  Surgeon: Jordan, Peter M, MD;  Location: [facility] INVASIVE CV LAB;  Service: Cardiovascular;  Laterality: N/A;   RADIAL ARTERY HARVEST Right 07/03/2020   Procedure: RADIAL ARTERY HARVEST;  Surgeon: Shyrl Linnie KIDD, MD;  Location: [facility] OR;  Service: Open Heart Surgery;  Laterality: Right;   RIGHT HEART CATH N/A 02/11/2024   Procedure: RIGHT  HEART CATH;  Surgeon: Rolan Ezra RAMAN, MD;  Location: [facility] INVASIVE CV LAB;  Service: Cardiovascular;  Laterality: N/A;   RIGHT/LEFT HEART CATH AND CORONARY/GRAFT ANGIOGRAPHY N/A 02/19/2023   Procedure: RIGHT/LEFT HEART CATH AND CORONARY/GRAFT ANGIOGRAPHY;  Surgeon: Rolan Ezra RAMAN, MD;  Location: [facility] INVASIVE CV LAB;  Service: Cardiovascular;  Laterality: N/A;   TEE WITHOUT CARDIOVERSION N/A 07/03/2020   Procedure: TRANSESOPHAGEAL ECHOCARDIOGRAM (TEE);  Surgeon: Shyrl Linnie KIDD, MD;  Location: [facility] OR;  Service: Open Heart Surgery;  Laterality: N/A;   TYMPANOSTOMY TUBE PLACEMENT     when I was a kid   UPPER GASTROINTESTINAL ENDOSCOPY      Family History: Family History  Problem Relation Age of Onset   Colon cancer Father 27   Colon cancer Paternal Grandfather    Colon cancer Paternal Aunt    Hypertension Neg Hx    Diabetes Neg Hx    Coronary artery disease Neg Hx    Esophageal cancer Neg Hx    Rectal cancer Neg Hx    Stomach cancer Neg Hx     Social History: Social History   Socioeconomic History   Marital status: Single    Spouse name: Not on file   Number of children: Not on file   Years of education: Not on file   Highest education level: High school graduate  Occupational History   Occupation: Not actively working    Comment: applied for disability x 2.   Tobacco Use   Smoking status: Former    Current packs/day: 0.00    Average packs/day: 1 pack/day for 22.0 years (22.0 ttl pk-yrs)    Types: Cigarettes, Cigars    Start date: 06/29/1998    Quit date: 06/28/2020    Years since quitting: 3.6   Smokeless tobacco: Never   Tobacco comments:    cigarettes stopped in 2020, [employer] started in 2018-04-05/day  Vaping Use   Vaping status: Never Used  Substance and Sexual Activity   Alcohol use: Never   Drug use: Not Currently    Types: Marijuana   Sexual activity: Never  Other Topics Concern   Not on file  Social History Narrative   Single   Lives with his  parents   Trying to get his GED   DeGent date all cultures   Social Drivers of Health   Tobacco Use: Medium Risk (02/23/2024)   Patient History    Smoking Tobacco Use: Former  Smokeless Tobacco Use: Never    Passive Exposure: Not on file  Financial Resource Strain: Not on file  Food Insecurity: No Food Insecurity (02/23/2024)   Epic    Worried About Programme Researcher, Broadcasting/film/video in the Last Year: Never true    Ran Out of Food in the Last Year: Never true  Transportation Needs: No Transportation Needs (02/23/2024)   Epic    Lack of Transportation (Medical): No    Lack of Transportation (Non-Medical): No  Physical Activity: Not on file  Stress: Not on file  Social Connections: Not on file  Depression (PHQ2-9): Medium Risk (04/14/2021)   Depression (PHQ2-9)    PHQ-2 Score: 6  Alcohol Screen: Not on file  Housing: Low Risk (02/23/2024)   Epic    Unable to Pay for Housing in the Last Year: No    Number of Times Moved in the Last Year: 0    Homeless in the Last Year: No  Utilities: Not At Risk (02/23/2024)   Epic    Threatened with loss of utilities: No  Health Literacy: Not on file    Allergies:  Allergies[1]  Objective:    Vital Signs:   Temp:  [97.8 F (36.6 C)-98.4 F (36.9 C)] 97.9 F (36.6 C) (12/26 0721) Pulse Rate:  [36-83] 67 (12/26 0721) Resp:  [19-20] 20 (12/26 0721) BP: (112-121)/(62-73) 113/72 (12/26 0721) SpO2:  [92 %-97 %] 93 % (12/26 0721) Weight:  [102.9 kg] 102.9 kg (12/26 0401) Last BM Date : 02/23/24  Weight change: Filed Weights   02/23/24 1244 02/24/24 0300 02/25/24 0401  Weight: 102.1 kg 101.8 kg 102.9 kg   Intake/Output:  Intake/Output Summary (Last 24 hours) at 02/25/2024 0816 Last data filed at 02/24/2024 2200 Gross per 24 hour  Intake 240 ml  Output 650 ml  Net -410 ml    Physical Exam   General:  Well, pale appearing.   Cor: Regular rate & rhythm. No murmurs. JVD flat.  Lungs: expiratory wheezing Extremities: no edema   Telemetry    NSR with 13-18 PVCs/min  Labs   Basic Metabolic Panel: Recent Labs  Lab 02/23/24 1323 02/24/24 0258 02/25/24 0331  NA 140 137 135  K 5.0 4.4 4.6  CL 107 102 100  CO2 19* 25 25  GLUCOSE 95 99 101*  BUN 21* 26* 24*  CREATININE 1.16 1.58* 1.19  CALCIUM  9.3 8.8* 8.9  MG  --  2.4  --    Liver Function Tests: Recent Labs  Lab 02/23/24 1323  AST 28  ALT 30  ALKPHOS 88  BILITOT 0.7  PROT 6.8  ALBUMIN  4.1   CBC: Recent Labs  Lab 02/23/24 1323  WBC 9.5  HGB 14.5  HCT 44.2  MCV 93.8  PLT 277   BNP (last 3 results) Recent Labs    04/27/23 0945 06/08/23 1040 01/31/24 0943  BNP 140.7* 135.4* 392.9*   ProBNP (last 3 results) Recent Labs    02/23/24 1323  PROBNP 668.0*   CBG: Recent Labs  Lab 02/23/24 1333  GLUCAP 94   Imaging   No results found.  Medications:    Current Medications:  atorvastatin   80 mg Oral Daily   digoxin   0.125 mg Oral Daily   isosorbide  mononitrate  15 mg Oral Daily   mexiletine  150 mg Oral BID   pantoprazole   40 mg Oral BID AC   sodium chloride  flush  3 mL Intravenous Q12H   sodium zirconium cyclosilicate   5 g Oral Daily  Infusions:   Assessment/Plan   1. Acute on chronic Systolic CHF: Ischemic cardiomyopathy.  [employer] ICD.  Cardiac MRI in 10/22 showed LVEF 29% and RVEF 45%.  Patient has a cardiac contractility modulator.  Echo 12/24 showed EF 30-35%, global hypokinesis, mild RV dysfunction, IVC not dilated.  GDMT has been limited by hypotension & hyperkalemia, K is now controlled with Lokelma .  Had RHC 12/24 showing normal filling pressures, low cardiac output with CI 2 by Fick, 1.82 by thermodilution.  RHC 12/25 with normal filling pressures and low fick CO 2.02. - Significantly short of breath with intermittent chest pressure. NYHA IV.  - Appears euvolemic on exam - restart lasix  20 mg daily - continue digoxin  0.0625 mg daily - restart Farxiga  10 mg daily - stop imdur , add back entresto  if BP picks up  (on lokelma  5 g daily with entresto /spiro, add back if restart entresto ) - restart spiro at 12.5 mg daily, hold on daily lokelma  for now K ok - VAD/tx w/u in started in outpatient; may have VAD team see while inpatient  2. PVCs: Frequent  - Zio 12/25 with 14% PVCs - increase mexiletine to 200 mg tid - may need to restart amio  3. CAD: Early onset CAD s/p MI with LAD PCI in 2012,followed by CABG  x 4 4/22  Admit 10/22 with NSTEMI, cath showed patent LIMA to LAD, all other grafts occluded. S/p PCI DES native mid LCx 99% stenosis. Repeat 01/2023 LHC showed coronary anatomy unchanged; grafts all occluded (known prior) except LIMA-LAD.  Occluded native RCA (known from prior) with patent mid LCx stent.  - reports intermittent chest pressure, no typical angina - He is now off Plavix  (stopped 8/23 due to BRBPR). - Holding eliquis  with possible cath, will discuss with Dr. Quinnten Calvin - Continue atorvastatin  80 mg daily  4. 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  No thrombus on echo 08/22. No thrombus on cMRI 10/22.  No thrombus on most recent echo.   - Holding eliquis  for now  5. Hyperkalemia: He has had a tendency towards hyperkalemia and has been on Lokelma  in order to stay on spironolactone  and Entresto . Plan as above  6. Atrial flutter/fibrillation: Both arrhythmias noted in 10/22.  He tolerated poorly with worsened symptoms.  He had DCCV in 10/22. Previously on amiodarone  but later stopped. NSR on tele - Hold eliquis  - Would favor ablation if he has recurrent AF.   7. Rectal bleeding: Red blood in stool.  Has had before from hemorrhoids.   - Hgb stable.   8. Wheezing - add PRN albuterol    Length of Stay: 2  Jordan Lee, NP  02/25/2024, 8:16 AM  Advanced Heart Failure Team Pager [phone] (M-F; 7a - 5p)   Please visit [email or web address]: For overnight coverage please call cardiology fellow first. If fellow not available call Shock/ECMO MD on call.  For ECMO / Mechanical Support (Impella, IABP, LVAD)  issues call Shock / ECMO MD on call.   Patient seen and examined with the above-signed Advanced Practice Provider and/or Housestaff. I personally reviewed laboratory data, imaging studies and relevant notes. I independently examined the patient and formulated the important aspects of the plan. I have edited the note to reflect any of my changes or salient points. I have personally discussed the plan with the patient and/or family.  38 yo male with CAD and severe systolic HF due to iCM, now admitted with ADHF. EF 30-35% recent RHC with low output. Considering advanced therapies  Feeling some better  with diuresis but remains SOB.   General:  Sitting up in bed. No resp difficulty HEENT: normal Neck: supple. JVP 8-9  Cor: Regular rate & rhythm. No rubs, gallops or murmurs. Lungs: clear Abdomen: soft, nontender, nondistended.Good bowel sounds. Extremities: no cyanosis, clubbing, rash, edema Neuro: alert & orientedx3, cranial nerves grossly intact. moves all 4 extremities w/o difficulty. Affect pleasant  Adjust GDMT as above. Continue IV diuresis one more day. VAD team has seen today to help facilitate referral to [facility] for HF. At some point may need to consider home inotropes but hoping to avoid at this point.   Hopefully home tomorrow after diuresis.  Toribio Fuel, MD  5:41 PM       [1]  Allergies Allergen Reactions   Codeine Itching   "

## 2024-02-25 NOTE — TOC Initial Note (Signed)
 Transition of Care [facility]) - Initial/Assessment Note    Patient Details  Name: Wyatt Donovan MRN: [id number] Date of Birth: 05-19-1985  Transition [facility] CM/SW Contact:    Arlana JINNY Nicholaus ISRAEL Phone Number: [phone] 02/25/2024, 2:44 PM  Clinical Narrative:       HF CSW met with patient at bedside. Patient stated that he lives with his father and brother. Patient stated that he has no history of HH services. Patient stated that he uses a cane. Patient stated that he has a scale at home. Patient stated that he has a PCP. CSW explained that a hospital follow up appointment is tryptically scheduled closer towards dc. Patient is agreeable. Patient stated that his father will provide transportation at dc.   [facility] CSW/CM will continue to follow and monitor for dc readiness.                 Patient Goals and CMS Choice            Expected Discharge Plan and Services                                              Prior Living Arrangements/Services                       Activities of Daily Living   ADL Screening (condition at time of admission) Independently performs ADLs?: Yes (appropriate for developmental age) Is the patient deaf or have difficulty hearing?: No Does the patient have difficulty seeing, even when wearing glasses/contacts?: No Does the patient have difficulty concentrating, remembering, or making decisions?: No  Permission Sought/Granted                  Emotional Assessment              Admission diagnosis:  SOB (shortness of breath) [R06.02] Cardiomyopathy, unspecified type (HCC) [I42.9] Acute on chronic HFrEF (heart failure with reduced ejection fraction) (HCC) [I50.23] Patient Active Problem List   Diagnosis Date Noted   PVC (premature ventricular contraction) 02/24/2024   AKI (acute kidney injury) 02/24/2024   Acute on chronic HFrEF (heart failure with reduced ejection fraction) (HCC) 02/23/2024   Abdominal  pain, epigastric 06/24/2023   Nausea and vomiting 03/20/2023   Constipation 03/20/2023   RUQ pain 03/19/2023   CAP (community acquired pneumonia) 03/19/2023   Gastritis determined by endoscopy 10/24/2022   Unstable angina (HCC) 10/23/2022   Chronic systolic heart failure (HCC) 04/17/2022   Ischemic cardiomyopathy 02/10/2021   Hypercholesterolemia 12/03/2020   Chronic systolic CHF (congestive heart failure) (HCC) 12/03/2020   NSTEMI (non-ST elevated myocardial infarction) (HCC) 12/03/2020   S/P CABG x 4 07/03/2020   Coronary artery disease 07/03/2020   Non-ST elevation (NSTEMI) myocardial infarction [facility])    Chest pain 06/29/2020   PCP:  Orpha Yancey LABOR, MD Pharmacy:   [facility] - [place], [place] - [address] [place] [address] Phone: [phone] Fax: [phone]     Social Drivers of Health (SDOH) Social History: SDOH Screenings   Food Insecurity: No Food Insecurity (02/23/2024)  Housing: Low Risk (02/23/2024)  Transportation Needs: No Transportation Needs (02/23/2024)  Utilities: Not At Risk (02/23/2024)  Depression (PHQ2-9): Medium Risk (04/14/2021)  Tobacco Use: Medium Risk (02/23/2024)   SDOH Interventions:     Readmission Risk Interventions  No data to display

## 2024-02-25 NOTE — Progress Notes (Addendum)
 " PROGRESS NOTE    THORNE WIRZ  FMW:[id number] DOB: May 06, 1985 DOA: 02/23/2024 PCP: Orpha Yancey LABOR, MD   Brief Narrative:  Wyatt Donovan is a 38 year old male with a history of HFrEF (EF 30-35%) status post AICD, CAD s/p PCI followed by CABG (LIMA to LAD patent, remaining grafts occluded), paroxysmal atrial fibrillation status post DCCV 2022, intermittent rectal bleeding, LV thrombus, hyperlipidemia, history of tobacco use in remission, and frequent PVCs presenting with worsening dyspnea approximately 3 to 4 days prior to admission.    Repeat echo shows depressed EF at 20 to 25% with global hypokinesia and low normal RVEF concerning for worsening status prompting visit to the hospital.  Patient transferred to [facility] for further evaluation and treatment.  Patient also has transient episodes of bleeding with bowel movements, notes bright red blood, also indicates this is consistent with his prior history of hemorrhoids.  He has not had a bloody bowel movement for approximately 1 week prior to admission.  Assessment & Plan:   Principal Problem:   Acute on chronic HFrEF (heart failure with reduced ejection fraction) (HCC) Active Problems:   Coronary artery disease   Hypercholesterolemia   PVC (premature ventricular contraction)   AKI (acute kidney injury)   Acute on chronic HFrEF - 02/23/2024 echo EF 20-25%, global HK, low normal RVF - CVP was 3 mmHg on echo - Cardiology consulted recommending holding further diuretics in setting of mild increase of creatinine - Tentative plan for left heart cath per cardiology - Borderline hypotension and likely to continue limiting GDMT - Restart Farxiga , entresto , spironolactone ; DC imdur   Rectal bleeding, history of internal hemorrhoids, resolved - Appears to be self-limiting and intermittent, hemoglobin at baseline around 14 - No indication for GI consult at this time, if worsening hemoglobin or rectal bleeding would reach out to  patient's established GI team,  with Dr. Federico - but currently reports last bloody BM was over a week ago - normal brown BM here since admit. 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,followed by CABG  x 4 4/22  - Plavix  discontinued in 2023; left heart cath being considered as above   Hyperlipidemia - Continue statin  DVT prophylaxis: SCDs Start: 02/23/24 1814 Code Status:   Code Status: Full Code Family Communication: None present  Status is: Inpatient  Dispo: The patient is from: Home              Anticipated d/c is to: Home              Anticipated d/c date is: Pending cardiac procedure timing              Patient currently not medically stable for discharge  Consultants:  Cardiology  Procedures:  Left heart cath pending as above  Antimicrobials:  None indicated  Subjective: No acute issues or events overnight denies nausea vomiting diarrhea constipation headache fevers chills or chest pain.  Objective: Vitals:   02/24/24 1919 02/24/24 2333 02/25/24 0355 02/25/24  0401  BP: 114/68 112/68 115/73   Pulse: 83 81 71   Resp: 19 19 19    Temp: 97.8 F (36.6 C) 97.8 F (36.6 C) 97.9 F (36.6 C)   TempSrc: Oral Oral Oral   SpO2: 95% 94% 92%   Weight:    102.9 kg  Height:        Intake/Output Summary (Last 24 hours) at 02/25/2024 0732 Last data filed at 02/24/2024 2200 Gross per 24 hour  Intake 240 ml  Output 650  ml  Net -410 ml   Filed Weights   02/23/24 1244 02/24/24 0300 02/25/24 0401  Weight: 102.1 kg 101.8 kg 102.9 kg    Examination:  General:  Pleasantly resting in bed, No acute distress. HEENT:  Normocephalic atraumatic.  Sclerae nonicteric, noninjected.  Extraocular movements intact bilaterally. Neck:  Without mass or deformity.  Trachea is midline. Lungs:  Clear to auscultate bilaterally without rhonchi, wheeze, or rales. Heart:  Regular rate and rhythm.  Without murmurs, rubs, or gallops. Abdomen:  Soft, obese, nontender, nondistended.  Without guarding or rebound. Extremities: Without cyanosis, clubbing, edema, or obvious deformity. Skin:  Warm and dry, no erythema.  Data Reviewed: I have personally reviewed following labs and imaging studies  CBC: Recent Labs  Lab 02/23/24 1323  WBC 9.5  HGB 14.5  HCT 44.2  MCV 93.8  PLT 277   Basic Metabolic Panel: Recent Labs  Lab 02/23/24 1323 02/24/24 0258 02/25/24 0331  NA 140 137 135  K 5.0 4.4 4.6  CL 107 102 100  CO2 19* 25 25  GLUCOSE 95 99 101*  BUN 21* 26* 24*  CREATININE 1.16 1.58* 1.19  CALCIUM  9.3 8.8* 8.9  MG  --  2.4  --    GFR: Estimated Creatinine Clearance: 104 mL/min (by C-G formula based on SCr of 1.19 mg/dL). Liver Function Tests: Recent Labs  Lab 02/23/24 1323  AST 28  ALT 30  ALKPHOS 88  BILITOT 0.7  PROT 6.8  ALBUMIN  4.1   No results for input(s): LIPASE, AMYLASE in the last 168 hours. No results for input(s): AMMONIA in the last 168 hours. Coagulation Profile: No results for input(s): INR, PROTIME in the last 168 hours. Cardiac Enzymes: No results for input(s): CKTOTAL, CKMB, CKMBINDEX, TROPONINI in the last 168 hours. BNP (last 3 results) Recent Labs    02/23/24 1323  PROBNP 668.0*   HbA1C: No results for input(s): HGBA1C in the last 72 hours. CBG: Recent Labs  Lab 02/23/24 1333  GLUCAP 94   Lipid Profile: No results for input(s): CHOL, HDL,  LDLCALC, TRIG, CHOLHDL, LDLDIRECT in the last 72 hours. Thyroid  Function Tests: No results for input(s): TSH, T4TOTAL, FREET4, T3FREE, THYROIDAB in the last 72 hours. Anemia Panel: No results for input(s): VITAMINB12, FOLATE, FERRITIN, TIBC, IRON, RETICCTPCT in the last 72 hours. Sepsis Labs: No results for input(s): PROCALCITON, LATICACIDVEN in the last 168 hours.  No results found for this or any previous visit (from the past 240 hours).       Radiology Studies: DG Chest Portable 1 View Result Date: 02/23/2024 CLINICAL DATA:  Chest pain EXAM: PORTABLE CHEST 1 VIEW COMPARISON:  October 23, 2022 FINDINGS: Stable cardiomediastinal silhouette. Lungs are clear. Sternotomy wires are noted. Stable bilateral pacemaker placement. Bony thorax is unremarkable. IMPRESSION: No active disease. Electronically Signed   By: Lynwood Landy Raddle M.D.   On: 02/23/2024 13:32   ECHOCARDIOGRAM COMPLETE Result Date: 02/23/2024    ECHOCARDIOGRAM REPORT   Patient Name:   Wyatt  L Donovan Date of Exam: 02/23/2024 Medical Rec #:  [id number]        Height:       73.0 in Accession #:    [id number]       Weight:       220.0 lb Date of Birth:  October 20, 1985        BSA:          2.241 m Patient Age:    38 years         BP:           82/60 mmHg Patient Gender: M                HR:           79 bpm. Exam Location:  [facility] Procedure: 2D Echo, 3D Echo, Cardiac Doppler, Color Doppler, Strain Analysis and            Intracardiac Opacification Agent (Both Spectral and Color Flow            Doppler were utilized during procedure). Indications:    Congestive Heart Failure l50.9  History:        Patient has prior history of Echocardiogram examinations, most                 recent 02/17/2023. CHF and Cardiomyopathy, CAD and Previous                 Myocardial Infarction, Pacemaker and Defibrillator,                 Arrythmias:Atrial Fibrillation; Risk Factors:Former Smoker and                 Dyslipidemia.   Sonographer:    Aida Pizza RCS Referring Phys: [phone] EZRA GORMAN SHUCK  Sonographer Comments: Global longitudinal strain was attempted. IMPRESSIONS  1. No LV thrombus by Definity . Left ventricular ejection fraction, by estimation, is 20 to 25%. Left ventricular ejection fraction by 3D volume is 23 %. The left ventricle has severely decreased function. The left ventricle demonstrates global hypokinesis. The left ventricular internal cavity size was moderately to severely dilated. Left ventricular diastolic parameters are indeterminate. The average left ventricular global longitudinal strain is -3.9 %. The global longitudinal strain is abnormal.  2. Right ventricular systolic function is low normal. The right ventricular size is normal. Tricuspid regurgitation signal is inadequate for assessing PA pressure.  3. The mitral valve is normal in structure. No evidence of mitral valve regurgitation. No evidence of mitral stenosis.  4. The aortic valve was not well visualized. Aortic valve regurgitation is not visualized. No aortic stenosis is present.  5. The inferior vena cava is normal in size with greater than 50% respiratory variability, suggesting right atrial pressure of 3 mmHg. Comparison(s): Changes from prior study are noted. LV function worsened with new RWMA. FINDINGS  Left Ventricle: No LV thrombus by Definity . Left ventricular ejection fraction, by estimation, is 20 to 25%. Left ventricular ejection fraction by 3D volume is 23 %. The left ventricle has severely decreased function. The left ventricle demonstrates global hypokinesis. Definity  contrast agent was given IV to delineate the left ventricular endocardial borders. The average left ventricular global longitudinal strain is -3.9 %. Strain was performed and the global longitudinal strain is abnormal. The left ventricular internal cavity size was moderately to severely dilated. There is no left ventricular hypertrophy. Left ventricular diastolic parameters  are indeterminate.  LV Wall Scoring: The entire anterior septum, apical lateral segment, and  apex are akinetic. The entire anterior wall, antero-lateral wall, entire inferior wall, posterior wall, mid inferoseptal segment, and basal inferoseptal segment are hypokinetic. Right Ventricle: The right ventricular size is normal. No increase in right ventricular wall thickness. Right ventricular systolic function is low normal. Tricuspid regurgitation signal is inadequate for assessing PA pressure. Left Atrium: Left atrial size was normal in size. Right Atrium: Right atrial size was normal in size. Pericardium: There is no evidence of pericardial effusion. Mitral Valve: The mitral valve is normal in structure. No evidence of mitral valve regurgitation. No evidence of mitral valve stenosis. Tricuspid Valve: The tricuspid valve is normal in structure. Tricuspid valve regurgitation is not demonstrated. No evidence of tricuspid stenosis. Aortic Valve: The aortic valve was not well visualized. Aortic valve regurgitation is not visualized. No aortic stenosis is present. Pulmonic Valve: The pulmonic valve was not well visualized. Pulmonic valve regurgitation is trivial. No evidence of pulmonic stenosis. Aorta: The aortic root is normal in size and structure. Venous: The inferior vena cava is normal in size with greater than 50% respiratory variability, suggesting right atrial pressure of 3 mmHg. IAS/Shunts: No atrial level shunt detected by color flow Doppler. Additional Comments: 3D was performed not requiring image post processing on an independent workstation and was abnormal.  LEFT VENTRICLE PLAX 2D LVIDd:         6.10 cm         Diastology LVIDs:         5.30 cm         LV e' medial:    7.34 cm/s LV PW:         0.90 cm         LV E/e' medial:  9.3 LV IVS:        0.80 cm         LV e' lateral:   12.20 cm/s LVOT diam:     2.10 cm         LV E/e' lateral: 5.6 LV SV:         33 LV SV Index:   15              2D Longitudinal  LVOT Area:     3.46 cm        Strain                                2D Strain GLS   -3.9 %                                Avg: LV Volumes (MOD) LV vol d, MOD    110.0 ml      3D Volume EF A2C:                           LV 3D EF:    Left LV vol d, MOD    143.0 ml                   ventricul A4C:                                        ar LV vol s, MOD    92.8 ml  ejection A2C:                                        fraction LV vol s, MOD    110.0 ml                   by 3D A4C:                                        volume is LV SV MOD A2C:   17.2 ml                    23 %. LV SV MOD A4C:   143.0 ml LV SV MOD BP:    28.2 ml                                3D Volume EF:                                3D EF:        23 %                                LV EDV:       181 ml                                LV ESV:       139 ml                                LV SV:        42 ml RIGHT VENTRICLE RV S prime:     7.21 cm/s TAPSE (M-mode): 1.6 cm LEFT ATRIUM           Index        RIGHT ATRIUM           Index LA diam:      3.20 cm 1.43 cm/m   RA Area:     12.20 cm LA Vol (A2C): 43.4 ml 19.37 ml/m  RA Volume:   27.50 ml  12.27 ml/m LA Vol (A4C): 60.7 ml 27.09 ml/m  AORTIC VALVE LVOT Vmax:   58.00 cm/s LVOT Vmean:  41.800 cm/s LVOT VTI:    0.095 m  AORTA Ao Root diam: 3.50 cm MITRAL VALVE MV Area (PHT): 2.09 cm    SHUNTS MV Decel Time: 363 msec    Systemic VTI:  0.10 m MR Peak grad: 42.5 mmHg    Systemic Diam: 2.10 cm MR Mean grad: 29.0 mmHg MR Vmax:      326.00 cm/s MR Vmean:     255.0 cm/s MV E velocity: 68.00 cm/s MV A velocity: 24.60 cm/s MV E/A ratio:  2.76 Vishnu Priya Mallipeddi Electronically signed by Diannah Late Mallipeddi Signature Date/Time: 02/23/2024/1:31:37 PM    Final         Scheduled Meds:  atorvastatin   80 mg Oral Daily   digoxin   0.125 mg Oral Daily   isosorbide  mononitrate  15 mg Oral Daily   mexiletine  150 mg Oral BID   pantoprazole   40 mg Oral BID AC   sodium chloride   flush  3 mL Intravenous Q12H   sodium zirconium cyclosilicate   5 g Oral Daily   Continuous Infusions:     LOS: 2 days   Time spent:  Elsie JAYSON Mont[place], DO [facility]  If 7PM-7AM, please contact night-coverage [email or web address]  02/25/2024, 7:32 AM      "

## 2024-02-26 ENCOUNTER — Inpatient Hospital Stay (HOSPITAL_COMMUNITY)

## 2024-02-26 ENCOUNTER — Other Ambulatory Visit (HOSPITAL_COMMUNITY): Payer: Self-pay

## 2024-02-26 ENCOUNTER — Ambulatory Visit (HOSPITAL_COMMUNITY): Payer: Self-pay | Admitting: Cardiology

## 2024-02-26 DIAGNOSIS — I5023 Acute on chronic systolic (congestive) heart failure: Secondary | ICD-10-CM | POA: Diagnosis not present

## 2024-02-26 DIAGNOSIS — I214 Non-ST elevation (NSTEMI) myocardial infarction: Secondary | ICD-10-CM | POA: Diagnosis not present

## 2024-02-26 DIAGNOSIS — N179 Acute kidney failure, unspecified: Secondary | ICD-10-CM | POA: Diagnosis not present

## 2024-02-26 DIAGNOSIS — I493 Ventricular premature depolarization: Secondary | ICD-10-CM | POA: Diagnosis not present

## 2024-02-26 LAB — TROPONIN T, HIGH SENSITIVITY: Troponin T High Sensitivity: 39 ng/L — ABNORMAL HIGH (ref 0–19)

## 2024-02-26 LAB — CBC
HCT: 44.9 % (ref 39.0–52.0)
Hemoglobin: 14.6 g/dL (ref 13.0–17.0)
MCH: 30.1 pg (ref 26.0–34.0)
MCHC: 32.5 g/dL (ref 30.0–36.0)
MCV: 92.6 fL (ref 80.0–100.0)
Platelets: 238 K/uL (ref 150–400)
RBC: 4.85 MIL/uL (ref 4.22–5.81)
RDW: 15 % (ref 11.5–15.5)
WBC: 10 K/uL (ref 4.0–10.5)
nRBC: 0 % (ref 0.0–0.2)

## 2024-02-26 LAB — GLUCOSE, CAPILLARY
Glucose-Capillary: 105 mg/dL — ABNORMAL HIGH (ref 70–99)
Glucose-Capillary: 155 mg/dL — ABNORMAL HIGH (ref 70–99)

## 2024-02-26 LAB — BASIC METABOLIC PANEL WITH GFR
Anion gap: 10 (ref 5–15)
Anion gap: 16 — ABNORMAL HIGH (ref 5–15)
Anion gap: 11 (ref 5–15)
BUN: 24 mg/dL — ABNORMAL HIGH (ref 6–20)
BUN: 24 mg/dL — ABNORMAL HIGH (ref 6–20)
BUN: 23 mg/dL — ABNORMAL HIGH (ref 6–20)
CO2: 25 mmol/L (ref 22–32)
CO2: 16 mmol/L — ABNORMAL LOW (ref 22–32)
CO2: 23 mmol/L (ref 22–32)
Calcium: 9.5 mg/dL (ref 8.9–10.3)
Calcium: 9.5 mg/dL (ref 8.9–10.3)
Calcium: 9.1 mg/dL (ref 8.9–10.3)
Chloride: 101 mmol/L (ref 98–111)
Chloride: 100 mmol/L (ref 98–111)
Chloride: 100 mmol/L (ref 98–111)
Creatinine, Ser: 1.21 mg/dL (ref 0.61–1.24)
Creatinine, Ser: 1.18 mg/dL (ref 0.61–1.24)
Creatinine, Ser: 1.15 mg/dL (ref 0.61–1.24)
GFR, Estimated: >60 mL/min
GFR, Estimated: >60 mL/min
GFR, Estimated: >60 mL/min
Glucose, Bld: 102 mg/dL — ABNORMAL HIGH (ref 70–99)
Glucose, Bld: 102 mg/dL — ABNORMAL HIGH (ref 70–99)
Glucose, Bld: 98 mg/dL (ref 70–99)
Potassium: 6.5 mmol/L (ref 3.5–5.1)
Potassium: 5.6 mmol/L — ABNORMAL HIGH (ref 3.5–5.1)
Potassium: 5.3 mmol/L — ABNORMAL HIGH (ref 3.5–5.1)
Sodium: 135 mmol/L (ref 135–145)
Sodium: 132 mmol/L — ABNORMAL LOW (ref 135–145)
Sodium: 134 mmol/L — ABNORMAL LOW (ref 135–145)

## 2024-02-26 MED ORDER — CALCIUM GLUCONATE-NACL 1-0.675 GM/50ML-% IV SOLN
1.0000 g | Freq: Once | INTRAVENOUS | Status: AC
  Administered 2024-02-26: 1000 mg via INTRAVENOUS
  Filled 2024-02-26: qty 50

## 2024-02-26 MED ORDER — SODIUM ZIRCONIUM CYCLOSILICATE 5 G PO PACK
5.0000 g | PACK | Freq: Every day | ORAL | Status: DC
  Filled 2024-02-26 (×2): qty 1

## 2024-02-26 MED ORDER — INSULIN ASPART 100 UNIT/ML IV SOLN
10.0000 [IU] | Freq: Once | INTRAVENOUS | Status: AC
  Administered 2024-02-26: 10 [IU] via INTRAVENOUS
  Filled 2024-02-26: qty 1

## 2024-02-26 MED ORDER — ISOSORBIDE MONONITRATE ER 30 MG PO TB24
30.0000 mg | ORAL_TABLET | Freq: Every day | ORAL | Status: DC
  Filled 2024-02-26: qty 1

## 2024-02-26 MED ORDER — SODIUM ZIRCONIUM CYCLOSILICATE 10 G PO PACK
10.0000 g | PACK | Freq: Once | ORAL | Status: AC
  Administered 2024-02-26: 10 g via ORAL
  Filled 2024-02-26: qty 1

## 2024-02-26 MED ORDER — SODIUM ZIRCONIUM CYCLOSILICATE 5 G PO PACK: 5.0000 g | PACK | Freq: Every day | ORAL | Status: DC

## 2024-02-26 MED ORDER — APIXABAN 5 MG PO TABS
5.0000 mg | ORAL_TABLET | Freq: Two times a day (BID) | ORAL | Status: DC
  Administered 2024-02-26 (×2): 5 mg via ORAL
  Filled 2024-02-26 (×2): qty 1

## 2024-02-26 MED ORDER — SODIUM ZIRCONIUM CYCLOSILICATE 5 G PO PACK
5.0000 g | PACK | ORAL | Status: AC
  Administered 2024-02-26: 5 g via ORAL
  Filled 2024-02-26: qty 1

## 2024-02-26 MED ORDER — DEXTROSE 50 % IV SOLN
1.0000 | Freq: Once | INTRAVENOUS | Status: AC
  Administered 2024-02-26: 50 mL via INTRAVENOUS
  Filled 2024-02-26: qty 50

## 2024-02-26 NOTE — Plan of Care (Signed)

## 2024-02-26 NOTE — Progress Notes (Signed)
 " Cardiology Progress Note:   Patient ID: Wyatt Donovan MRN: [id number]; DOB: 1985/08/24  Admit date: 02/23/2024 Date of Consult: 02/26/2024  Primary Care Provider: Orpha Donovan LABOR, MD [facility] Cardiologist: Ezra Shuck, MD  [facility] Electrophysiologist:  OLE ONEIDA HOLTS, MD   Patient Profile:   Wyatt Donovan is a 38 y.o. male with MV CAD s/p 4V CABG/ICM, HFrEF, s/p primary prevention left B SCI ICD, prior MI in 2012 with LAD PCI, inferior posterior MI in 05/2020 with subsequent MVD requiring CABG, prior LV thrombus, postop GI bleed following CABG, NSTEMI in 11/2020 with patent LIMA-LAD but occlusion of all other grafts with PCI to the [facility] for 99% stenosis with resolution of symptoms, AF/AFL with RVR who presented to [facility] with 2 to 3 months RUQ pain with cholelithiasis diagnosed. Repeat cath 01/2023 with stable anatomy and not no intervention.  Presented 02/19/2024 with DOE and bloody bowel movements.  Now being managed for SOB and anginal symptoms.  Interval Events:   Overnight 1 episode of severe chest discomfort that he describes similar to his prior heart attack. hsT at 03:28 (39) stable from recent priors (40-35-41).  His last coronary angiography was 02/19/2023 with all occluded grafts other than LIMA-LAD.   Past Medical History:  Diagnosis Date   Atrial fibrillation (HCC)    Atypical chest pain    CAD (coronary artery disease)    CHF (congestive heart failure) (HCC)    Dyslipidemia    Ex-smoker    History of depression    Hyperlipidemia    Insomnia    Ischemic cardiomyopathy    Ejection fraction 40-45%   NSTEMI (non-ST elevated myocardial infarction) (HCC) 08/01/2007   Treated with a bare metal stent to the proximal LAD, pt states MI x3   Suicide attempt (HCC) 01/30/2001   Past Surgical History:  Procedure Laterality Date   ADENOIDECTOMY     CARDIOVERSION N/A 12/06/2020   Procedure: CARDIOVERSION;  Surgeon: Shuck Ezra RAMAN, MD;   Location: [facility] ENDOSCOPY;  Service: Cardiovascular;  Laterality: N/A;   CCM GENERATOR AND A/V LEAD INSERTION N/A 04/17/2022   Procedure: CCM GENERATOR AND A/V LEAD INSERTION;  Surgeon: Holts Ole ONEIDA, MD;  Location: [facility] INVASIVE CV LAB;  Service: Cardiovascular;  Laterality: N/A;   COLONOSCOPY     CORONARY ARTERY BYPASS GRAFT N/A 07/03/2020   Procedure: CORONARY ARTERY BYPASS GRAFTING (CABG) times four using left internal mammary artery, right arm radial artery and right leg saphenous vein;  Surgeon: Shyrl Linnie KIDD, MD;  Location: [facility] OR;  Service: Open Heart Surgery;  Laterality: N/A;   CORONARY STENT INTERVENTION N/A 12/03/2020   Procedure: CORONARY STENT INTERVENTION;  Surgeon: Jordan, Peter M, MD;  Location: [facility] INVASIVE CV LAB;  Service: Cardiovascular;  Laterality: N/A;   CORONARY STENT PLACEMENT     Bare metal stent to proximal LAD   ESOPHAGOGASTRODUODENOSCOPY (EGD) WITH PROPOFOL  N/A 06/24/2023   Procedure: ESOPHAGOGASTRODUODENOSCOPY (EGD) WITH PROPOFOL ;  Surgeon: Federico Rosario BROCKS, MD;  Location: [facility] ENDOSCOPY;  Service: Gastroenterology;  Laterality: N/A;   ICD IMPLANT N/A 02/10/2021   Procedure: ICD IMPLANT;  Surgeon: Holts Ole ONEIDA, MD;  Location: [facility] INVASIVE CV LAB;  Service: Cardiovascular;  Laterality: N/A;   LEFT HEART CATH AND CORONARY ANGIOGRAPHY N/A 07/01/2020   Procedure: LEFT HEART CATH AND CORONARY ANGIOGRAPHY;  Surgeon: Court Dorn PARAS, MD;  Location: [facility] INVASIVE CV LAB;  Service: Cardiovascular;  Laterality: N/A;   LEFT HEART CATH AND CORS/GRAFTS ANGIOGRAPHY N/A 12/03/2020   Procedure: LEFT  HEART CATH AND CORS/GRAFTS ANGIOGRAPHY;  Surgeon: Jordan, Peter M, MD;  Location: [facility] INVASIVE CV LAB;  Service: Cardiovascular;  Laterality: N/A;   RADIAL ARTERY HARVEST Right 07/03/2020   Procedure: RADIAL ARTERY HARVEST;  Surgeon: Shyrl Linnie KIDD, MD;  Location: [facility] OR;  Service: Open Heart Surgery;  Laterality: Right;   RIGHT HEART CATH N/A 02/11/2024   Procedure: RIGHT  HEART CATH;  Surgeon: Rolan Ezra RAMAN, MD;  Location: [facility] INVASIVE CV LAB;  Service: Cardiovascular;  Laterality: N/A;   RIGHT/LEFT HEART CATH AND CORONARY/GRAFT ANGIOGRAPHY N/A 02/19/2023   Procedure: RIGHT/LEFT HEART CATH AND CORONARY/GRAFT ANGIOGRAPHY;  Surgeon: Rolan Ezra RAMAN, MD;  Location: [facility] INVASIVE CV LAB;  Service: Cardiovascular;  Laterality: N/A;   TEE WITHOUT CARDIOVERSION N/A 07/03/2020   Procedure: TRANSESOPHAGEAL ECHOCARDIOGRAM (TEE);  Surgeon: Shyrl Linnie KIDD, MD;  Location: [facility] OR;  Service: Open Heart Surgery;  Laterality: N/A;   TYMPANOSTOMY TUBE PLACEMENT     when I was a kid   UPPER GASTROINTESTINAL ENDOSCOPY      Inpatient Medications: Scheduled Meds:  atorvastatin   80 mg Oral Daily   dapagliflozin  propanediol  10 mg Oral Daily   digoxin   0.125 mg Oral Daily   furosemide   20 mg Oral Daily   isosorbide  mononitrate  30 mg Oral Daily   mexiletine  200 mg Oral BID   pantoprazole   40 mg Oral BID AC   sodium chloride  flush  3 mL Intravenous Q12H   spironolactone   12.5 mg Oral Daily   Continuous Infusions:  PRN Meds: acetaminophen , albuterol , morphine  injection, nitroGLYCERIN , ondansetron  (ZOFRAN ) IV, polyethylene glycol, sodium chloride  flush  Allergies:   Allergies[1]  Social History:   Social History   Socioeconomic History   Marital status: Single    Spouse name: Not on file   Number of children: Not on file   Years of education: Not on file   Highest education level: High school graduate  Occupational History   Occupation: Not actively working    Comment: applied for disability x 2.   Tobacco Use   Smoking status: Former    Current packs/day: 0.00    Average packs/day: 1 pack/day for 22.0 years (22.0 ttl pk-yrs)    Types: Cigarettes, Cigars    Start date: 06/29/1998    Quit date: 06/28/2020    Years since quitting: 3.6   Smokeless tobacco: Never   Tobacco comments:    cigarettes stopped in 2020, [employer] started in 2018-04-05/day  Vaping  Use   Vaping status: Never Used  Substance and Sexual Activity   Alcohol use: Never   Drug use: Not Currently    Types: Marijuana   Sexual activity: Never  Other Topics Concern   Not on file  Social History Narrative   Single   Lives with his parents   Trying to get his GED   DeGent date all cultures   Social Drivers of Health   Tobacco Use: Medium Risk (02/23/2024)   Patient History    Smoking Tobacco Use: Former    Smokeless Tobacco Use: Never    Passive Exposure: Not on Actuary Strain: Not on file  Food Insecurity: No Food Insecurity (02/23/2024)   Epic    Worried About Programme Researcher, Broadcasting/film/video in the Last Year: Never true    Ran Out of Food in the Last Year: Never true  Transportation Needs: No Transportation Needs (02/23/2024)   Epic    Lack of Transportation (Medical): No    Lack  of Transportation (Non-Medical): No  Physical Activity: Not on file  Stress: Not on file  Social Connections: Not on file  Intimate Partner Violence: Not At Risk (02/23/2024)   Epic    Fear of Current or Ex-Partner: No    Emotionally Abused: No    Physically Abused: No    Sexually Abused: No  Depression (PHQ2-9): Medium Risk (04/14/2021)   Depression (PHQ2-9)    PHQ-2 Score: 6  Alcohol Screen: Not on file  Housing: Low Risk (02/23/2024)   Epic    Unable to Pay for Housing in the Last Year: No    Number of Times Moved in the Last Year: 0    Homeless in the Last Year: No  Utilities: Not At Risk (02/23/2024)   Epic    Threatened with loss of utilities: No  Health Literacy: Not on file    Family History:    Family History  Problem Relation Age of Onset   Colon cancer Father 11   Colon cancer Paternal Grandfather    Colon cancer Paternal Aunt    Hypertension Neg Hx    Diabetes Neg Hx    Coronary artery disease Neg Hx    Esophageal cancer Neg Hx    Rectal cancer Neg Hx    Stomach cancer Neg Hx     ROS:  Review of Systems: [y] = yes, [ ]  = no      General:  Weight gain [ ] ; Weight loss [ ] ; Anorexia [ ] ; Fatigue [ ] ; Fever [ ] ; Chills [ ] ; Weakness [ ]    Cardiac: Chest pain/pressure [y]; Resting SOB [ ] ; Exertional SOB [ ] ; Orthopnea [ ] ; Pedal Edema [ ] ; Palpitations [ ] ; Syncope [ ] ; Presyncope [ ] ; Paroxysmal nocturnal dyspnea [ ]    Pulmonary: Cough [ ] ; Wheezing [ ] ; Hemoptysis [ ] ; Sputum [ ] ; Snoring [ ]    GI: Vomiting [ ] ; Dysphagia [ ] ; Melena [ ] ; Hematochezia [ ] ; Heartburn [ ] ; Abdominal pain [ ] ; Constipation [ ] ; Diarrhea [ ] ; BRBPR [ ]    GU: Hematuria [ ] ; Dysuria [ ] ; Nocturia [ ]  Vascular: Pain in legs with walking [ ] ; Pain in feet with lying flat [ ] ; Non-healing sores [ ] ; Stroke [ ] ; TIA [ ] ; Slurred speech [ ] ;   Neuro: Headaches [ ] ; Vertigo [ ] ; Seizures [ ] ; Paresthesias [ ] ;Blurred vision [ ] ; Diplopia [ ] ; Vision changes [ ]    Ortho/Skin: Arthritis [ ] ; Joint pain [ ] ; Muscle pain [ ] ; Joint swelling [ ] ; Back Pain [ ] ; Rash [ ]    Psych: Depression [ ] ; Anxiety [ ]    Heme: Bleeding problems [ ] ; Clotting disorders [ ] ; Anemia [ ]    Endocrine: Diabetes [ ] ; Thyroid  dysfunction [ ]    Physical Exam/Data:   Vitals:   02/25/24 2335 02/26/24 0300 02/26/24 0740 02/26/24 0923  BP: 112/72 115/83 99/76 101/78  Pulse: 87 (!) 43 88 87  Resp:  18 16 17   Temp:  99.4 F (37.4 C) 99.1 F (37.3 C)   TempSrc:  Oral Oral   SpO2: 99% 96% 95% 97%  Weight:  102.1 kg    Height:  6' 1 (1.854 m)      Intake/Output Summary (Last 24 hours) at 02/26/2024 1038 Last data filed at 02/26/2024 0931 Gross per 24 hour  Intake 52.48 ml  Output 1750 ml  Net -1697.52 ml      02/26/2024    3:00 AM 02/25/2024  4:01 AM 02/24/2024    3:00 AM  Last 3 Weights  Weight (lbs) 225 lb 1.4 oz 226 lb 14.4 oz 224 lb 6.9 oz  Weight (kg) 102.1 kg 102.921 kg 101.8 kg     Body mass index is 29.7 kg/m.  General: resting in bed, conversant  Endocrine:  No thryomegaly Cardiac:  normal S1, S2; RRR; no murmur  Lungs:  clear to auscultation bilaterally,  no wheezing, rhonchi or rales  Abd: soft, nontender, no hepatomegaly  Ext: no edema Musculoskeletal: No deformities, BUE and BLE strength normal and equal Skin: warm and dry  Neuro:  CNs 2-12 intact, no focal abnormalities noted Psych:  Normal affect   EKG:  The EKG was personally reviewed and demonstrates: none today  Telemetry:  Telemetry was personally reviewed and demonstrates: NSR 80--110s   Relevant CV Studies:  TTE Result date: 02/23/24  1. No LV thrombus by Definity . Left ventricular ejection fraction, by  estimation, is 20 to 25%. Left ventricular ejection fraction by 3D volume  is 23 %. The left ventricle has severely decreased function. The left  ventricle demonstrates global  hypokinesis. The left ventricular internal cavity size was moderately to  severely dilated. Left ventricular diastolic parameters are indeterminate.  The average left ventricular global longitudinal strain is -3.9 %. The  global longitudinal strain is  abnormal.   2. Right ventricular systolic function is low normal. The right  ventricular size is normal. Tricuspid regurgitation signal is inadequate  for assessing PA pressure.   3. The mitral valve is normal in structure. No evidence of mitral valve  regurgitation. No evidence of mitral stenosis.   4. The aortic valve was not well visualized. Aortic valve regurgitation  is not visualized. No aortic stenosis is present.   5. The inferior vena cava is normal in size with greater than 50%  respiratory variability, suggesting right atrial pressure of 3 mmHg.   Laboratory Data:  Chemistry Recent Labs  Lab 02/24/24 0258 02/25/24 0331 02/26/24 0328  NA 137 135 135  K 4.4 4.6 6.5*  CL 102 100 101  CO2 25 25 25   GLUCOSE 99 101* 102*  BUN 26* 24* 24*  CREATININE 1.58* 1.19 1.21  CALCIUM  8.8* 8.9 9.5  GFRNONAA 56* >60 >60  ANIONGAP 11 10 10     Recent Labs  Lab 02/23/24 1323  PROT 6.8  ALBUMIN  4.1  AST 28  ALT 30  ALKPHOS 88  BILITOT  0.7   Hematology Recent Labs  Lab 02/23/24 1323 02/26/24 0328  WBC 9.5 10.0  RBC 4.71 4.85  HGB 14.5 14.6  HCT 44.2 44.9  MCV 93.8 92.6  MCH 30.8 30.1  MCHC 32.8 32.5  RDW 15.3 15.0  PLT 277 238   BNP Recent Labs  Lab 02/23/24 1323  PROBNP 668.0*    Radiology/Studies:  DG Chest Portable 1 View Result Date: 02/23/2024 CLINICAL DATA:  Chest pain EXAM: PORTABLE CHEST 1 VIEW COMPARISON:  October 23, 2022 FINDINGS: Stable cardiomediastinal silhouette. Lungs are clear. Sternotomy wires are noted. Stable bilateral pacemaker placement. Bony thorax is unremarkable. IMPRESSION: No active disease. Electronically Signed   By: Lynwood Landy Raddle M.D.   On: 02/23/2024 13:32   ECHOCARDIOGRAM COMPLETE Result Date: 02/23/2024    ECHOCARDIOGRAM REPORT   Patient Name:   Wyatt Donovan Date of Exam: 02/23/2024 Medical Rec #:  [id number]        Height:       73.0 in Accession #:    [id number]  Weight:       220.0 lb Date of Birth:  1985/12/21        BSA:          2.241 m Patient Age:    38 years         BP:           82/60 mmHg Patient Gender: M                HR:           79 bpm. Exam Location:  [facility] Procedure: 2D Echo, 3D Echo, Cardiac Doppler, Color Doppler, Strain Analysis and            Intracardiac Opacification Agent (Both Spectral and Color Flow            Doppler were utilized during procedure). Indications:    Congestive Heart Failure l50.9  History:        Patient has prior history of Echocardiogram examinations, most                 recent 02/17/2023. CHF and Cardiomyopathy, CAD and Previous                 Myocardial Infarction, Pacemaker and Defibrillator,                 Arrythmias:Atrial Fibrillation; Risk Factors:Former Smoker and                 Dyslipidemia.  Sonographer:    Aida Pizza RCS Referring Phys: [phone] EZRA GORMAN SHUCK  Sonographer Comments: Global longitudinal strain was attempted. IMPRESSIONS  1. No LV thrombus by Definity . Left ventricular ejection fraction, by  estimation, is 20 to 25%. Left ventricular ejection fraction by 3D volume is 23 %. The left ventricle has severely decreased function. The left ventricle demonstrates global hypokinesis. The left ventricular internal cavity size was moderately to severely dilated. Left ventricular diastolic parameters are indeterminate. The average left ventricular global longitudinal strain is -3.9 %. The global longitudinal strain is abnormal.  2. Right ventricular systolic function is low normal. The right ventricular size is normal. Tricuspid regurgitation signal is inadequate for assessing PA pressure.  3. The mitral valve is normal in structure. No evidence of mitral valve regurgitation. No evidence of mitral stenosis.  4. The aortic valve was not well visualized. Aortic valve regurgitation is not visualized. No aortic stenosis is present.  5. The inferior vena cava is normal in size with greater than 50% respiratory variability, suggesting right atrial pressure of 3 mmHg. Comparison(s): Changes from prior study are noted. LV function worsened with new RWMA. FINDINGS  Left Ventricle: No LV thrombus by Definity . Left ventricular ejection fraction, by estimation, is 20 to 25%. Left ventricular ejection fraction by 3D volume is 23 %. The left ventricle has severely decreased function. The left ventricle demonstrates global hypokinesis. Definity  contrast agent was given IV to delineate the left ventricular endocardial borders. The average left ventricular global longitudinal strain is -3.9 %. Strain was performed and the global longitudinal strain is abnormal. The left ventricular internal cavity size was moderately to severely dilated. There is no left ventricular hypertrophy. Left ventricular diastolic parameters are indeterminate.  LV Wall Scoring: The entire anterior septum, apical lateral segment, and apex are akinetic. The entire anterior wall, antero-lateral wall, entire inferior wall, posterior wall, mid inferoseptal  segment, and basal inferoseptal segment are hypokinetic. Right Ventricle: The right ventricular size is normal. No increase in right ventricular wall thickness. Right  ventricular systolic function is low normal. Tricuspid regurgitation signal is inadequate for assessing PA pressure. Left Atrium: Left atrial size was normal in size. Right Atrium: Right atrial size was normal in size. Pericardium: There is no evidence of pericardial effusion. Mitral Valve: The mitral valve is normal in structure. No evidence of mitral valve regurgitation. No evidence of mitral valve stenosis. Tricuspid Valve: The tricuspid valve is normal in structure. Tricuspid valve regurgitation is not demonstrated. No evidence of tricuspid stenosis. Aortic Valve: The aortic valve was not well visualized. Aortic valve regurgitation is not visualized. No aortic stenosis is present. Pulmonic Valve: The pulmonic valve was not well visualized. Pulmonic valve regurgitation is trivial. No evidence of pulmonic stenosis. Aorta: The aortic root is normal in size and structure. Venous: The inferior vena cava is normal in size with greater than 50% respiratory variability, suggesting right atrial pressure of 3 mmHg. IAS/Shunts: No atrial level shunt detected by color flow Doppler. Additional Comments: 3D was performed not requiring image post processing on an independent workstation and was abnormal.  LEFT VENTRICLE PLAX 2D LVIDd:         6.10 cm         Diastology LVIDs:         5.30 cm         LV e' medial:    7.34 cm/s LV PW:         0.90 cm         LV E/e' medial:  9.3 LV IVS:        0.80 cm         LV e' lateral:   12.20 cm/s LVOT diam:     2.10 cm         LV E/e' lateral: 5.6 LV SV:         33 LV SV Index:   15              2D Longitudinal LVOT Area:     3.46 cm        Strain                                2D Strain GLS   -3.9 %                                Avg: LV Volumes (MOD) LV vol d, MOD    110.0 ml      3D Volume EF A2C:                            LV 3D EF:    Left LV vol d, MOD    143.0 ml                   ventricul A4C:                                        ar LV vol s, MOD    92.8 ml                    ejection A2C:  fraction LV vol s, MOD    110.0 ml                   by 3D A4C:                                        volume is LV SV MOD A2C:   17.2 ml                    23 %. LV SV MOD A4C:   143.0 ml LV SV MOD BP:    28.2 ml                                3D Volume EF:                                3D EF:        23 %                                LV EDV:       181 ml                                LV ESV:       139 ml                                LV SV:        42 ml RIGHT VENTRICLE RV S prime:     7.21 cm/s TAPSE (M-mode): 1.6 cm LEFT ATRIUM           Index        RIGHT ATRIUM           Index LA diam:      3.20 cm 1.43 cm/m   RA Area:     12.20 cm LA Vol (A2C): 43.4 ml 19.37 ml/m  RA Volume:   27.50 ml  12.27 ml/m LA Vol (A4C): 60.7 ml 27.09 ml/m  AORTIC VALVE LVOT Vmax:   58.00 cm/s LVOT Vmean:  41.800 cm/s LVOT VTI:    0.095 m  AORTA Ao Root diam: 3.50 cm MITRAL VALVE MV Area (PHT): 2.09 cm    SHUNTS MV Decel Time: 363 msec    Systemic VTI:  0.10 m MR Peak grad: 42.5 mmHg    Systemic Diam: 2.10 cm MR Mean grad: 29.0 mmHg MR Vmax:      326.00 cm/s MR Vmean:     255.0 cm/s MV E velocity: 68.00 cm/s MV A velocity: 24.60 cm/s MV E/A ratio:  2.76 Vishnu Priya Mallipeddi Electronically signed by Diannah Late Mallipeddi Signature Date/Time: 02/23/2024/1:31:37 PM    Final    Assessment and Plan:  Wyatt Donovan is a 38 y.o. male with MV CAD s/p 4V CABG/ICM, HFrEF, s/p primary prevention left B SCI ICD, prior MI in 2012 with LAD PCI, inferior posterior MI in 05/2020 with subsequent MVD requiring CABG, prior LV thrombus, postop GI bleed following CABG, NSTEMI in 11/2020 with patent LIMA-LAD but occlusion of all other grafts with PCI to the mLCX  for 99% stenosis with resolution of symptoms, AF/AFL with RVR  who presented to [facility] with 2 to 3 months RUQ pain with cholelithiasis diagnosed. Repeat cath 01/2023 with stable anatomy and not no intervention.  Presented 02/19/2024 with DOE and bloody bowel movements.  Now being managed for SOB and anginal symptoms.  Acute on chronic Systolic CHF: Ischemic cardiomyopathy.  [employer] ICD.  Cardiac MRI in 10/22 showed LVEF 29% and RVEF 45%. Patient has a cardiac contractility modulator.  TTE this admission with further decline in LVEF to 20-25% - SOB less of an issue today, more anginal complaints  - Appears euvolemic on exam - continue lasix  20 mg daily - continue digoxin  0.0625 mg daily - continue Farxiga  10 mg daily - waiting for repeat BMP with hyperkalemia on AM labs, presume its hemolyzed but will hold spiro and wait to start entresto  until it results, with holding lokelma  need to make sure it isn't really K 6.5 today  - stopped imdur , add back entresto  if BP picks up (on lokelma  5 g daily with entresto /spiro, add back if restart entresto ) - VAD/tx w/u in started in outpatient; may have VAD team see while inpatient   2. PVCs: Frequent  - Zio 12/25 with 14% PVCs - increase mexiletine to 200 mg tid - may need to restart amio   3. CAD: Early onset CAD s/p MI with LAD PCI in 2012,followed by CABG  x 4 4/22  Admit 10/22 with NSTEMI, cath showed patent LIMA to LAD, all other grafts occluded. S/p PCI DES native mid LCx 99% stenosis. Repeat 01/2023 LHC showed coronary anatomy unchanged; grafts all occluded (known prior) except LIMA-LAD.  Occluded native RCA (known from prior) with patent mid LCx stent.  - reports intermittent chest pressure - He is now off Plavix  (stopped 8/23 due to BRBPR). - Continue atorvastatin  80 mg daily   4. 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  No thrombus on echo 08/22. No thrombus on cMRI 10/22.  No thrombus on most recent echo.   - resume eliquis  today with prior AF/AFL and LV thrombus hx    5. Hyperkalemia: He has  had a tendency towards hyperkalemia and has been on Lokelma  in order to stay on spironolactone  and Entresto . Plan as above   6. Atrial flutter/fibrillation: Both arrhythmias noted in 10/22.  He tolerated poorly with worsened symptoms.  He had DCCV in 10/22. Previously on amiodarone  but later stopped. NSR on tele - resume eliquis  today - Would favor ablation if he has recurrent AF.    7. Rectal bleeding: Red blood in stool.  Has had before from hemorrhoids.   - Hb stable.    8. Wheezing - add PRN albuterol   For questions or updates, please contact [facility] Please consult [email or web address] for contact info under   Signed, Donnice DELENA Primus, MD  02/26/2024 10:38 AM     [1]  Allergies Allergen Reactions   Codeine Itching   "

## 2024-02-26 NOTE — Progress Notes (Signed)
 " PROGRESS NOTE    Wyatt Donovan  FMW:[id number] DOB: 1985/06/16 DOA: 02/23/2024 PCP: Orpha Yancey LABOR, MD   Brief Narrative:  Wyatt Donovan is a 38 year old male with a history of HFrEF (EF 30-35%) status post AICD, CAD s/p PCI followed by CABG (LIMA to LAD patent, remaining grafts occluded), paroxysmal atrial fibrillation status post DCCV 2022, intermittent rectal bleeding, LV thrombus, hyperlipidemia, history of tobacco use in remission, and frequent PVCs presenting with worsening dyspnea approximately 3 to 4 days prior to admission.    Repeat echo shows depressed EF at 20 to 25% with global hypokinesia and low normal RVEF concerning for worsening status prompting visit to the hospital.  Patient transferred to [facility] for further evaluation and treatment.  Patient also has transient episodes of bleeding with bowel movements, notes bright red blood, also indicates this is consistent with his prior history of hemorrhoids.  He has not had a bloody bowel movement for approximately 1 week prior to admission.  Assessment & Plan:   Principal Problem:   Acute on chronic HFrEF (heart failure with reduced ejection fraction) (HCC) Active Problems:   Coronary artery disease   Hypercholesterolemia   PVC (premature ventricular contraction)   AKI (acute kidney injury)  Acute on chronic HFrEF - 02/23/2024 echo EF 20-25%, global HK, low normal RVF - CVP was 3 mmHg on echo - Cardiology consulted recommending holding further diuretics in setting of mild increase of creatinine - Tentative plan for left heart cath per cardiology - Borderline hypotension and likely to continue limiting GDMT - Restart Farxiga , entresto , spironolactone ; DC imdur   Hyperkalemia - acute (?on chronic) - No clear etiology for 2 point bump in potassium overnight - Initially thought to be hemolyzed but repeat continues to be elevated despite intervention overnight - Pharmacy notes chronic lokelma  dose discontinued -  will restart over next 24h pending repeat labs  Rectal bleeding, history of internal hemorrhoids, resolved - Appears to be self-limiting and intermittent, hemoglobin at baseline around 14 - No indication for GI consult at this time, if worsening hemoglobin or rectal bleeding would reach out to patient's established GI team, [place] with Dr. Federico - but currently reports last bloody BM was over a week ago - normal brown BM here since admit. 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,followed by CABG  x 4 4/22  - Plavix  discontinued in 2023; left heart cath being considered as above   Hyperlipidemia - Continue statin  DVT prophylaxis: SCDs Start: 02/23/24 1814 Code Status:   Code Status: Full Code Family Communication: None present  Status is: Inpatient  Dispo: The patient is from: Home              Anticipated d/c is to: Home              Anticipated d/c date is: Pending cardiac procedure timing              Patient currently not medically stable for discharge  Consultants:  Cardiology  Procedures:  Left heart cath pending as above  Antimicrobials:  None indicated  Subjective: No acute issues or events overnight denies nausea vomiting diarrhea constipation headache fevers chills or chest pain.  Objective: Vitals:   02/25/24 2318 02/25/24 2330 02/25/24 2335 02/26/24 0300  BP:  112/78 112/72 115/83  Pulse:  86 87 (!) 43  Resp: 18    18  Temp: 98.3 F (36.8 C)   99.4 F (37.4 C)  TempSrc: Oral   Oral  SpO2:  97% 99% 96%  Weight:    102.1 kg  Height:    6' 1 (1.854 m)    Intake/Output Summary (Last 24 hours) at 02/26/2024 0709 Last data filed at 02/26/2024 0610 Gross per 24 hour  Intake 288.48 ml  Output 1750 ml  Net -1461.52 ml   Filed Weights   02/24/24 0300 02/25/24 0401 02/26/24 0300  Weight: 101.8 kg 102.9 kg 102.1 kg    Examination:  General:  Pleasantly resting in bed, No acute distress. HEENT:  Normocephalic atraumatic.  Sclerae nonicteric, noninjected.  Extraocular movements intact bilaterally. Neck:  Without mass or deformity.  Trachea is midline. Lungs:  Clear to auscultate bilaterally without rhonchi, wheeze, or rales. Heart:  Regular rate and rhythm.  Without murmurs, rubs, or gallops. Abdomen:  Soft, obese, nontender, nondistended.  Without guarding or rebound. Extremities: Without cyanosis, clubbing, edema, or obvious deformity. Skin:  Warm and dry, no erythema.  Data Reviewed: I have personally reviewed following labs and imaging studies  CBC: Recent Labs  Lab 02/23/24 1323 02/26/24 0328  WBC 9.5 10.0  HGB 14.5 14.6  HCT 44.2 44.9  MCV 93.8 92.6  PLT 277 238   Basic Metabolic Panel: Recent Labs  Lab 02/23/24 1323 02/24/24 0258 02/25/24 0331 02/26/24 0328  NA 140 137 135 135  K 5.0 4.4 4.6 6.5*  CL 107 102 100 101  CO2 19* 25 25 25   GLUCOSE 95 99 101* 102*  BUN 21* 26* 24* 24*  CREATININE 1.16 1.58* 1.19 1.21  CALCIUM  9.3 8.8* 8.9 9.5  MG  --  2.4  --   --    GFR: Estimated Creatinine Clearance: 101.9 mL/min (by C-G formula based on SCr of 1.21 mg/dL). Liver Function Tests: Recent Labs  Lab 02/23/24 1323  AST 28  ALT 30  ALKPHOS 88  BILITOT 0.7  PROT 6.8  ALBUMIN  4.1   No results for input(s): LIPASE, AMYLASE in the last 168 hours. No results for input(s): AMMONIA in the last 168 hours. Coagulation Profile: No results for input(s): INR,  PROTIME in the last 168 hours. Cardiac Enzymes: No results for input(s): CKTOTAL, CKMB, CKMBINDEX, TROPONINI in the last 168 hours. BNP (last 3 results) Recent Labs    02/23/24 1323  PROBNP 668.0*   HbA1C: No results for input(s): HGBA1C in the last 72 hours. CBG: Recent Labs  Lab 02/23/24 1333  GLUCAP 94   Lipid Profile: No results for input(s): CHOL, HDL, LDLCALC, TRIG, CHOLHDL, LDLDIRECT in the last 72 hours. Thyroid  Function Tests: No results for input(s): TSH, T4TOTAL, FREET4, T3FREE, THYROIDAB in the last 72 hours. Anemia Panel: No results for input(s): VITAMINB12, FOLATE, FERRITIN, TIBC, IRON, RETICCTPCT in the last 72 hours. Sepsis Labs: No results for input(s): PROCALCITON, LATICACIDVEN in the last 168 hours.  No results found for this or any previous visit (from the past 240 hours).       Radiology Studies: No results found.       Scheduled Meds:  atorvastatin   80 mg Oral Daily   dapagliflozin  propanediol  10 mg Oral Daily   digoxin   0.125 mg Oral Daily   furosemide   20 mg Oral Daily   mexiletine  200 mg Oral BID   pantoprazole   40 mg Oral BID AC   sodium chloride  flush  3 mL Intravenous Q12H   spironolactone   12.5 mg Oral Daily   Continuous Infusions:     LOS: 3 days   Time spent:  Elsie JAYSON Montclair, DO [facility]  If 7PM-7AM, please contact night-coverage [email or web address]  02/26/2024, 7:09 AM      "

## 2024-02-26 NOTE — Progress Notes (Signed)
 Recheck BP after SL nitroglycerin  administration.    02/26/24 1542  Vitals  BP (!) 94/59  MAP (mmHg) 69  BP Location Left Arm  BP Method Automatic  Patient Position (if appropriate) Lying  Pulse Rate 87  Pulse Rate Source Monitor  ECG Heart Rate 93  MEWS COLOR  MEWS Score Color Green  Oxygen Therapy  SpO2 93 %  O2 Device Room Air  MEWS Score  MEWS Temp 0  MEWS Systolic 1  MEWS Pulse 0  MEWS RR 0  MEWS LOC 0  MEWS Score 1

## 2024-02-26 NOTE — Progress Notes (Signed)
 0.4 mg SL nitroglycerin  tablet given at this time for left sided chest pain.     02/26/24 1533  Vitals  Temp 98.9 F (37.2 C)  Temp Source Oral  BP 98/68  MAP (mmHg) 76  BP Location Left Arm  BP Method Automatic  Patient Position (if appropriate) Lying  Pulse Rate 95  Pulse Rate Source Monitor  ECG Heart Rate 87  Resp 17  Level of Consciousness  Level of Consciousness Alert  MEWS COLOR  MEWS Score Color Green  Oxygen Therapy  SpO2 95 %  O2 Device Room Air  Pain Assessment  Pain Scale 0-10  Pain Score 8  Pain Location Chest  Pain Orientation Left  Pain Intervention(s) Medication (See eMAR)  MEWS Score  MEWS Temp 0  MEWS Systolic 1  MEWS Pulse 0  MEWS RR 0  MEWS LOC 0  MEWS Score 1

## 2024-02-27 DIAGNOSIS — I5023 Acute on chronic systolic (congestive) heart failure: Secondary | ICD-10-CM | POA: Diagnosis not present

## 2024-02-27 LAB — BASIC METABOLIC PANEL WITH GFR
Anion gap: 10 (ref 5–15)
BUN: 20 mg/dL (ref 6–20)
CO2: 24 mmol/L (ref 22–32)
Calcium: 8.6 mg/dL — ABNORMAL LOW (ref 8.9–10.3)
Chloride: 98 mmol/L (ref 98–111)
Creatinine, Ser: 1.09 mg/dL (ref 0.61–1.24)
GFR, Estimated: >60 mL/min
Glucose, Bld: 104 mg/dL — ABNORMAL HIGH (ref 70–99)
Potassium: 4.1 mmol/L (ref 3.5–5.1)
Sodium: 132 mmol/L — ABNORMAL LOW (ref 135–145)

## 2024-02-27 MED ORDER — ISOSORBIDE MONONITRATE ER 30 MG PO TB24
30.0000 mg | ORAL_TABLET | Freq: Every day | ORAL | Status: DC
  Administered 2024-02-27: 30 mg via ORAL
  Filled 2024-02-27: qty 1

## 2024-02-27 MED ORDER — RANOLAZINE ER 500 MG PO TB12
500.0000 mg | ORAL_TABLET | Freq: Two times a day (BID) | ORAL | Status: DC
  Administered 2024-02-27 – 2024-03-06 (×16): 500 mg via ORAL
  Filled 2024-02-27 (×3): qty 1

## 2024-02-27 NOTE — Progress Notes (Signed)
 " Cardiology Consultation:   Patient ID: Wyatt Donovan MRN: [id number]; DOB: 1985/04/26  Admit date: 02/23/2024 Date of Consult: 02/27/2024  Primary Care Provider: Orpha Yancey LABOR, MD [facility] Cardiologist: Ezra Shuck, MD  [facility] Electrophysiologist:  OLE ONEIDA HOLTS, MD   Patient Profile:   Wyatt Donovan is a 38 y.o. male with MV CAD s/p 4V CABG/ICM, HFrEF, s/p primary prevention left B SCI ICD, prior MI in 2012 with LAD PCI, inferior posterior MI in 05/2020 with subsequent MVD requiring CABG, prior LV thrombus, postop GI bleed following CABG, NSTEMI in 11/2020 with patent LIMA-LAD but occlusion of all other grafts with PCI to the [facility] for 99% stenosis with resolution of symptoms, AF/AFL with RVR who presented to [facility] with 2 to 3 months RUQ pain with cholelithiasis diagnosed. Repeat cath 01/2023 with stable anatomy and not no intervention.  Presented 02/19/2024 with DOE and bloody bowel movements.  Now being managed for SOB and anginal symptoms.   Interval Events:   Continues to complain of 8/10 chest discomfort central nonradiating.  He says he has had this for at least a week continuous.  He is also now complaining of moderate back pain.  Requesting narcotics or medication that will completely make the pain go away.  He understands this is likely not to be a reasonable goal with his degree of coronary disease. Denies any significant shortness of breath.  Past Medical History:  Diagnosis Date   Atrial fibrillation (HCC)    Atypical chest pain    CAD (coronary artery disease)    CHF (congestive heart failure) (HCC)    Dyslipidemia    Ex-smoker    History of depression    Hyperlipidemia    Insomnia    Ischemic cardiomyopathy    Ejection fraction 40-45%   NSTEMI (non-ST elevated myocardial infarction) (HCC) 08/01/2007   Treated with a bare metal stent to the proximal LAD, pt states MI x3   Suicide attempt (HCC) 01/30/2001   Past Surgical History:   Procedure Laterality Date   ADENOIDECTOMY     CARDIOVERSION N/A 12/06/2020   Procedure: CARDIOVERSION;  Surgeon: Shuck Ezra RAMAN, MD;  Location: [facility] ENDOSCOPY;  Service: Cardiovascular;  Laterality: N/A;   CCM GENERATOR AND A/V LEAD INSERTION N/A 04/17/2022   Procedure: CCM GENERATOR AND A/V LEAD INSERTION;  Surgeon: Holts Ole ONEIDA, MD;  Location: [facility] INVASIVE CV LAB;  Service: Cardiovascular;  Laterality: N/A;   COLONOSCOPY     CORONARY ARTERY BYPASS GRAFT N/A 07/03/2020   Procedure: CORONARY ARTERY BYPASS GRAFTING (CABG) times four using left internal mammary artery, right arm radial artery and right leg saphenous vein;  Surgeon: Shyrl Linnie KIDD, MD;  Location: [facility] OR;  Service: Open Heart Surgery;  Laterality: N/A;   CORONARY STENT INTERVENTION N/A 12/03/2020   Procedure: CORONARY STENT INTERVENTION;  Surgeon: Jordan, Peter M, MD;  Location: [facility] INVASIVE CV LAB;  Service: Cardiovascular;  Laterality: N/A;   CORONARY STENT PLACEMENT     Bare metal stent to proximal LAD   ESOPHAGOGASTRODUODENOSCOPY (EGD) WITH PROPOFOL  N/A 06/24/2023   Procedure: ESOPHAGOGASTRODUODENOSCOPY (EGD) WITH PROPOFOL ;  Surgeon: Federico Rosario BROCKS, MD;  Location: [facility] ENDOSCOPY;  Service: Gastroenterology;  Laterality: N/A;   ICD IMPLANT N/A 02/10/2021   Procedure: ICD IMPLANT;  Surgeon: Holts Ole ONEIDA, MD;  Location: [facility] INVASIVE CV LAB;  Service: Cardiovascular;  Laterality: N/A;   LEFT HEART CATH AND CORONARY ANGIOGRAPHY N/A 07/01/2020   Procedure: LEFT HEART CATH AND CORONARY ANGIOGRAPHY;  Surgeon: Court,  Dorn PARAS, MD;  Location: [facility] INVASIVE CV LAB;  Service: Cardiovascular;  Laterality: N/A;   LEFT HEART CATH AND CORS/GRAFTS ANGIOGRAPHY N/A 12/03/2020   Procedure: LEFT HEART CATH AND CORS/GRAFTS ANGIOGRAPHY;  Surgeon: Jordan, Peter M, MD;  Location: [facility] INVASIVE CV LAB;  Service: Cardiovascular;  Laterality: N/A;   RADIAL ARTERY HARVEST Right 07/03/2020   Procedure: RADIAL ARTERY HARVEST;  Surgeon: Shyrl Linnie KIDD, MD;  Location: [facility] OR;  Service: Open Heart Surgery;  Laterality: Right;   RIGHT HEART CATH N/A 02/11/2024   Procedure: RIGHT HEART CATH;  Surgeon: Rolan Ezra RAMAN, MD;  Location: [facility] INVASIVE CV LAB;  Service: Cardiovascular;  Laterality: N/A;   RIGHT/LEFT HEART CATH AND CORONARY/GRAFT ANGIOGRAPHY N/A 02/19/2023   Procedure: RIGHT/LEFT HEART CATH AND CORONARY/GRAFT ANGIOGRAPHY;  Surgeon: Rolan Ezra RAMAN, MD;  Location: [facility] INVASIVE CV LAB;  Service: Cardiovascular;  Laterality: N/A;   TEE WITHOUT CARDIOVERSION N/A 07/03/2020   Procedure: TRANSESOPHAGEAL ECHOCARDIOGRAM (TEE);  Surgeon: Shyrl Linnie KIDD, MD;  Location: [facility] OR;  Service: Open Heart Surgery;  Laterality: N/A;   TYMPANOSTOMY TUBE PLACEMENT     when I was a kid   UPPER GASTROINTESTINAL ENDOSCOPY      Inpatient Medications: Scheduled Meds:  atorvastatin   80 mg Oral Daily   dapagliflozin  propanediol  10 mg Oral Daily   digoxin   0.125 mg Oral Daily   furosemide   20 mg Oral Daily   isosorbide  mononitrate  30 mg Oral Daily   mexiletine  200 mg Oral BID   pantoprazole   40 mg Oral BID AC   ranolazine   500 mg Oral BID   sodium chloride  flush  3 mL Intravenous Q12H   sodium zirconium cyclosilicate   5 g Oral Daily   Continuous Infusions:  PRN Meds: acetaminophen , albuterol , morphine  injection, nitroGLYCERIN , ondansetron  (ZOFRAN ) IV, polyethylene glycol, sodium chloride  flush  Allergies:   Allergies[1]  Social History:   Social History   Socioeconomic History   Marital status: Single    Spouse name: Not on file   Number of children: Not on file   Years of education: Not on file   Highest education level: High school graduate  Occupational History   Occupation: Not actively working    Comment: applied for disability x 2.   Tobacco Use   Smoking status: Former    Current packs/day: 0.00    Average packs/day: 1 pack/day for 22.0 years (22.0 ttl pk-yrs)    Types: Cigarettes, Cigars    Start date: 06/29/1998     Quit date: 06/28/2020    Years since quitting: 3.6   Smokeless tobacco: Never   Tobacco comments:    cigarettes stopped in 2020, [employer] started in 2018-04-05/day  Vaping Use   Vaping status: Never Used  Substance and Sexual Activity   Alcohol use: Never   Drug use: Not Currently    Types: Marijuana   Sexual activity: Never  Other Topics Concern   Not on file  Social History Narrative   Single   Lives with his parents   Trying to get his GED   DeGent date all cultures   Social Drivers of Health   Tobacco Use: Medium Risk (02/23/2024)   Patient History    Smoking Tobacco Use: Former    Smokeless Tobacco Use: Never    Passive Exposure: Not on Actuary Strain: Not on file  Food Insecurity: No Food Insecurity (02/23/2024)   Epic    Worried About Radiation Protection Practitioner of Food in the  Last Year: Never true    Ran Out of Food in the Last Year: Never true  Transportation Needs: No Transportation Needs (02/23/2024)   Epic    Lack of Transportation (Medical): No    Lack of Transportation (Non-Medical): No  Physical Activity: Not on file  Stress: Not on file  Social Connections: Not on file  Intimate Partner Violence: Not At Risk (02/23/2024)   Epic    Fear of Current or Ex-Partner: No    Emotionally Abused: No    Physically Abused: No    Sexually Abused: No  Depression (PHQ2-9): Medium Risk (04/14/2021)   Depression (PHQ2-9)    PHQ-2 Score: 6  Alcohol Screen: Not on file  Housing: Low Risk (02/23/2024)   Epic    Unable to Pay for Housing in the Last Year: No    Number of Times Moved in the Last Year: 0    Homeless in the Last Year: No  Utilities: Not At Risk (02/23/2024)   Epic    Threatened with loss of utilities: No  Health Literacy: Not on file    Family History:    Family History  Problem Relation Age of Onset   Colon cancer Father 73   Colon cancer Paternal Grandfather    Colon cancer Paternal Aunt    Hypertension Neg Hx    Diabetes Neg Hx     Coronary artery disease Neg Hx    Esophageal cancer Neg Hx    Rectal cancer Neg Hx    Stomach cancer Neg Hx     ROS:  Review of Systems: [y] = yes, [ ]  = no      General: Weight gain [ ] ; Weight loss [ ] ; Anorexia [ ] ; Fatigue [ ] ; Fever [ ] ; Chills [ ] ; Weakness [ ]    Cardiac: Chest pain/pressure [y]; Resting SOB [ ] ; Exertional SOB [ ] ; Orthopnea [ ] ; Pedal Edema [ ] ; Palpitations [ ] ; Syncope [ ] ; Presyncope [ ] ; Paroxysmal nocturnal dyspnea [ ]    Pulmonary: Cough [ ] ; Wheezing [ ] ; Hemoptysis [ ] ; Sputum [ ] ; Snoring [ ]    GI: Vomiting [ ] ; Dysphagia [ ] ; Melena [ ] ; Hematochezia [ ] ; Heartburn [ ] ; Abdominal pain [ ] ; Constipation [ ] ; Diarrhea [ ] ; BRBPR [ ]    GU: Hematuria [ ] ; Dysuria [ ] ; Nocturia [ ]  Vascular: Pain in legs with walking [ ] ; Pain in feet with lying flat [ ] ; Non-healing sores [ ] ; Stroke [ ] ; TIA [ ] ; Slurred speech [ ] ;   Neuro: Headaches [ ] ; Vertigo [ ] ; Seizures [ ] ; Paresthesias [ ] ;Blurred vision [ ] ; Diplopia [ ] ; Vision changes [ ]    Ortho/Skin: Arthritis [ ] ; Joint pain [ ] ; Muscle pain [ ] ; Joint swelling [ ] ; Back Pain [y]; Rash [ ]    Psych: Depression [ ] ; Anxiety [ ]    Heme: Bleeding problems [ ] ; Clotting disorders [ ] ; Anemia [ ]    Endocrine: Diabetes [ ] ; Thyroid  dysfunction [ ]    Physical Exam/Data:   Vitals:   02/27/24 0131 02/27/24 0139 02/27/24 0432 02/27/24 0700  BP:   121/69 112/65  Pulse:    81  Resp: 18  17 18   Temp: 98.6 F (37 C)  98.6 F (37 C)   TempSrc: Oral  Oral Oral  SpO2: 97%  95% 96%  Weight:  101.8 kg    Height:        Intake/Output Summary (Last 24 hours) at 02/27/2024 0931 Last  data filed at 02/27/2024 0700 Gross per 24 hour  Intake 720 ml  Output 1325 ml  Net -605 ml      02/27/2024    1:39 AM 02/26/2024    3:00 AM 02/25/2024    4:01 AM  Last 3 Weights  Weight (lbs) 224 lb 8 oz 225 lb 1.4 oz 226 lb 14.4 oz  Weight (kg) 101.833 kg 102.1 kg 102.921 kg     Body mass index is 29.62 kg/m.  General:  resting in bed, conversant  Endocrine:  No thryomegaly Cardiac:  normal S1, S2; RRR; no murmur  Lungs:  clear to auscultation bilaterally, no wheezing, rhonchi or rales  Abd: soft, nontender, no hepatomegaly  Ext: no edema Musculoskeletal: No deformities, BUE and BLE strength normal and equal Skin: warm and dry  Neuro:  CNs 2-12 intact, no focal abnormalities noted Psych:  Normal affect   EKG:  The EKG was personally reviewed and demonstrates: none today  Telemetry:  Telemetry was personally reviewed and demonstrates: NSR 70-80s then ventricular bigeminy at 05:04 this morning.  Relevant CV Studies: TTE Result date: 02/23/24  1. No LV thrombus by Definity . Left ventricular ejection fraction, by  estimation, is 20 to 25%. Left ventricular ejection fraction by 3D volume  is 23 %. The left ventricle has severely decreased function. The left  ventricle demonstrates global  hypokinesis. The left ventricular internal cavity size was moderately to  severely dilated. Left ventricular diastolic parameters are indeterminate.  The average left ventricular global longitudinal strain is -3.9 %. The  global longitudinal strain is  abnormal.   2. Right ventricular systolic function is low normal. The right  ventricular size is normal. Tricuspid regurgitation signal is inadequate  for assessing PA pressure.   3. The mitral valve is normal in structure. No evidence of mitral valve  regurgitation. No evidence of mitral stenosis.   4. The aortic valve was not well visualized. Aortic valve regurgitation  is not visualized. No aortic stenosis is present.   5. The inferior vena cava is normal in size with greater than 50%  respiratory variability, suggesting right atrial pressure of 3 mmHg.    Laboratory Data:  Chemistry Recent Labs  Lab 02/26/24 1051 02/26/24 1626 02/27/24 0259  NA 132* 134* 132*  K 5.6* 5.3* 4.1  CL 100 100 98  CO2 16* 23 24  GLUCOSE 102* 98 104*  BUN 24* 23* 20   CREATININE 1.18 1.15 1.09  CALCIUM  9.5 9.1 8.6*  GFRNONAA >60 >60 >60  ANIONGAP 16* 11 10    Recent Labs  Lab 02/23/24 1323  PROT 6.8  ALBUMIN  4.1  AST 28  ALT 30  ALKPHOS 88  BILITOT 0.7   Hematology Recent Labs  Lab 02/23/24 1323 02/26/24 0328  WBC 9.5 10.0  RBC 4.71 4.85  HGB 14.5 14.6  HCT 44.2 44.9  MCV 93.8 92.6  MCH 30.8 30.1  MCHC 32.8 32.5  RDW 15.3 15.0  PLT 277 238   BNP Recent Labs  Lab 02/23/24 1323  PROBNP 668.0*    Radiology/Studies:  DG Chest Portable 1 View Result Date: 02/23/2024 CLINICAL DATA:  Chest pain EXAM: PORTABLE CHEST 1 VIEW COMPARISON:  October 23, 2022 FINDINGS: Stable cardiomediastinal silhouette. Lungs are clear. Sternotomy wires are noted. Stable bilateral pacemaker placement. Bony thorax is unremarkable. IMPRESSION: No active disease. Electronically Signed   By: Lynwood Landy Raddle M.D.   On: 02/23/2024 13:32   ECHOCARDIOGRAM COMPLETE Result Date: 02/23/2024    ECHOCARDIOGRAM REPORT  Patient Name:   Wyatt Donovan Date of Exam: 02/23/2024 Medical Rec #:  [id number]        Height:       73.0 in Accession #:    [id number]       Weight:       220.0 lb Date of Birth:  12/22/1985        BSA:          2.241 m Patient Age:    38 years         BP:           82/60 mmHg Patient Gender: M                HR:           79 bpm. Exam Location:  [facility] Procedure: 2D Echo, 3D Echo, Cardiac Doppler, Color Doppler, Strain Analysis and            Intracardiac Opacification Agent (Both Spectral and Color Flow            Doppler were utilized during procedure). Indications:    Congestive Heart Failure l50.9  History:        Patient has prior history of Echocardiogram examinations, most                 recent 02/17/2023. CHF and Cardiomyopathy, CAD and Previous                 Myocardial Infarction, Pacemaker and Defibrillator,                 Arrythmias:Atrial Fibrillation; Risk Factors:Former Smoker and                 Dyslipidemia.  Sonographer:    Aida Pizza RCS Referring Phys: [phone] EZRA GORMAN SHUCK  Sonographer Comments: Global longitudinal strain was attempted. IMPRESSIONS  1. No LV thrombus by Definity . Left ventricular ejection fraction, by estimation, is 20 to 25%. Left ventricular ejection fraction by 3D volume is 23 %. The left ventricle has severely decreased function. The left ventricle demonstrates global hypokinesis. The left ventricular internal cavity size was moderately to severely dilated. Left ventricular diastolic parameters are indeterminate. The average left ventricular global longitudinal strain is -3.9 %. The global longitudinal strain is abnormal.  2. Right ventricular systolic function is low normal. The right ventricular size is normal. Tricuspid regurgitation signal is inadequate for assessing PA pressure.  3. The mitral valve is normal in structure. No evidence of mitral valve regurgitation. No evidence of mitral stenosis.  4. The aortic valve was not well visualized. Aortic valve regurgitation is not visualized. No aortic stenosis is present.  5. The inferior vena cava is normal in size with greater than 50% respiratory variability, suggesting right atrial pressure of 3 mmHg. Comparison(s): Changes from prior study are noted. LV function worsened with new RWMA. FINDINGS  Left Ventricle: No LV thrombus by Definity . Left ventricular ejection fraction, by estimation, is 20 to 25%. Left ventricular ejection fraction by 3D volume is 23 %. The left ventricle has severely decreased function. The left ventricle demonstrates global hypokinesis. Definity  contrast agent was given IV to delineate the left ventricular endocardial borders. The average left ventricular global longitudinal strain is -3.9 %. Strain was performed and the global longitudinal strain is abnormal. The left ventricular internal cavity size was moderately to severely dilated. There is no left ventricular hypertrophy. Left ventricular diastolic parameters are indeterminate.  LV  Wall Scoring: The entire anterior  septum, apical lateral segment, and apex are akinetic. The entire anterior wall, antero-lateral wall, entire inferior wall, posterior wall, mid inferoseptal segment, and basal inferoseptal segment are hypokinetic. Right Ventricle: The right ventricular size is normal. No increase in right ventricular wall thickness. Right ventricular systolic function is low normal. Tricuspid regurgitation signal is inadequate for assessing PA pressure. Left Atrium: Left atrial size was normal in size. Right Atrium: Right atrial size was normal in size. Pericardium: There is no evidence of pericardial effusion. Mitral Valve: The mitral valve is normal in structure. No evidence of mitral valve regurgitation. No evidence of mitral valve stenosis. Tricuspid Valve: The tricuspid valve is normal in structure. Tricuspid valve regurgitation is not demonstrated. No evidence of tricuspid stenosis. Aortic Valve: The aortic valve was not well visualized. Aortic valve regurgitation is not visualized. No aortic stenosis is present. Pulmonic Valve: The pulmonic valve was not well visualized. Pulmonic valve regurgitation is trivial. No evidence of pulmonic stenosis. Aorta: The aortic root is normal in size and structure. Venous: The inferior vena cava is normal in size with greater than 50% respiratory variability, suggesting right atrial pressure of 3 mmHg. IAS/Shunts: No atrial level shunt detected by color flow Doppler. Additional Comments: 3D was performed not requiring image post processing on an independent workstation and was abnormal.  LEFT VENTRICLE PLAX 2D LVIDd:         6.10 cm         Diastology LVIDs:         5.30 cm         LV e' medial:    7.34 cm/s LV PW:         0.90 cm         LV E/e' medial:  9.3 LV IVS:        0.80 cm         LV e' lateral:   12.20 cm/s LVOT diam:     2.10 cm         LV E/e' lateral: 5.6 LV SV:         33 LV SV Index:   15              2D Longitudinal LVOT Area:     3.46 cm         Strain                                2D Strain GLS   -3.9 %                                Avg: LV Volumes (MOD) LV vol d, MOD    110.0 ml      3D Volume EF A2C:                           LV 3D EF:    Left LV vol d, MOD    143.0 ml                   ventricul A4C:                                        ar LV vol s, MOD    92.8 ml  ejection A2C:                                        fraction LV vol s, MOD    110.0 ml                   by 3D A4C:                                        volume is LV SV MOD A2C:   17.2 ml                    23 %. LV SV MOD A4C:   143.0 ml LV SV MOD BP:    28.2 ml                                3D Volume EF:                                3D EF:        23 %                                LV EDV:       181 ml                                LV ESV:       139 ml                                LV SV:        42 ml RIGHT VENTRICLE RV S prime:     7.21 cm/s TAPSE (M-mode): 1.6 cm LEFT ATRIUM           Index        RIGHT ATRIUM           Index LA diam:      3.20 cm 1.43 cm/m   RA Area:     12.20 cm LA Vol (A2C): 43.4 ml 19.37 ml/m  RA Volume:   27.50 ml  12.27 ml/m LA Vol (A4C): 60.7 ml 27.09 ml/m  AORTIC VALVE LVOT Vmax:   58.00 cm/s LVOT Vmean:  41.800 cm/s LVOT VTI:    0.095 m  AORTA Ao Root diam: 3.50 cm MITRAL VALVE MV Area (PHT): 2.09 cm    SHUNTS MV Decel Time: 363 msec    Systemic VTI:  0.10 m MR Peak grad: 42.5 mmHg    Systemic Diam: 2.10 cm MR Mean grad: 29.0 mmHg MR Vmax:      326.00 cm/s MR Vmean:     255.0 cm/s MV E velocity: 68.00 cm/s MV A velocity: 24.60 cm/s MV E/A ratio:  2.76 Vishnu Priya Mallipeddi Electronically signed by Diannah Late Mallipeddi Signature Date/Time: 02/23/2024/1:31:37 PM    Final    Assessment and Plan:  Wyatt Donovan is a 37 y.o. male with MV CAD s/p 4V CABG/ICM, HFrEF, s/p primary prevention left B SCI ICD, prior  MI in 2012 with LAD PCI, inferior posterior MI in 05/2020 with subsequent MVD requiring CABG, prior LV  thrombus, postop GI bleed following CABG, NSTEMI in 11/2020 with patent LIMA-LAD but occlusion of all other grafts with PCI to the [facility] for 99% stenosis with resolution of symptoms, AF/AFL with RVR who presented to [facility] with 2 to 3 months RUQ pain with cholelithiasis diagnosed. Repeat cath 01/2023 with stable anatomy and not no intervention.  Presented 02/19/2024 with DOE and bloody bowel movements.  Now being managed for ongoing chest pain.   Acute on chronic Systolic CHF: Ischemic cardiomyopathy.  [employer] ICD.  Cardiac MRI in 10/22 showed LVEF 29% and RVEF 45%. Patient has a cardiac contractility modulator.  TTE this admission with further decline in LVEF to 20-25% - Continues to have constant 8/10 severity central nonradiating chest discomfort, will resume Imdur  30 mg daily as he was on this in the past and tolerated from a blood pressure standpoint, will also add Ranexa  500 mg twice daily. - Appears euvolemic on exam - continue lasix  20 mg daily - continue digoxin  0.0625 mg daily - continue Farxiga  10 mg daily - Spiro and Entresto  are going to be an issue related to his hyperkalemia, will prioritize antianginal therapy above given his refractory symptoms over optimization of GDMT at this point - VAD/tx w/u in started in outpatient; may have VAD team see while inpatient   2. PVCs: Frequent  - Zio 12/25 with 14% PVCs - mexiletine to 200 mg tid - Ventricular bigeminy this morning but otherwise has had intermittent PVCs, not starting Amio yet with this burden   3. CAD: Early onset CAD s/p MI with LAD PCI in 2012,followed by CABG  x 4 4/22  Admit 10/22 with NSTEMI, cath showed patent LIMA to LAD, all other grafts occluded. S/p PCI DES native mid LCx 99% stenosis. Repeat 01/2023 LHC showed coronary anatomy unchanged; grafts all occluded (known prior) except LIMA-LAD.  Occluded native RCA (known from prior) with patent mid LCx stent.  - reports constant chest pressure over the past  week - He is now off Plavix  (stopped 8/23 due to BRBPR). - Continue atorvastatin  80 mg daily   4. 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  No thrombus on echo 08/22. No thrombus on cMRI 10/22.  No thrombus on most recent echo.   - Discontinue Eliquis  today in case we need to consider repeat coronary angiography although as I explained to him the utility of this is limited since we already know that he has 3 of 4 grafts occluded and his options for intervention are very limited.  Last dose Eliquis  12/27 at 2216.   5. Hyperkalemia: He has had a tendency towards hyperkalemia and has been on Lokelma  in order to stay on spironolactone  and Entresto .  Have held Spyro and Entresto  as he kept having hyperkalemia in the past 48 hours and now with anginal complaints being prominent will prioritize antianginal therapies over GDMT.   6. Atrial flutter/fibrillation: Both arrhythmias noted in 10/22.  He tolerated poorly with worsened symptoms.  He had DCCV in 10/22. Previously on amiodarone  but later stopped. NSR on tele -holding Eliquis  as above in case we need to consider repeat - Would favor abla tion if he has recurrent AF.    7. Rectal bleeding: Red blood in stool.  Has had before from hemorrhoids.   - Hb stable.    8. Wheezing - add PRN albuterol   For questions or updates, please contact [facility] Please consult [email or web address] for contact info under   Signed, Donnice DELENA Primus, MD  02/27/2024 9:31 AM     [1]  Allergies Allergen Reactions   Codeine Itching   "

## 2024-02-27 NOTE — Plan of Care (Signed)

## 2024-02-27 NOTE — Progress Notes (Signed)
 " PROGRESS NOTE    Wyatt Donovan  FMW:[id number] DOB: 02-Apr-1985 DOA: 02/23/2024 PCP: Orpha Yancey LABOR, MD   Brief Narrative:  Wyatt Donovan is a 38 year old male with a history of HFrEF (EF 30-35%) status post AICD, CAD s/p PCI followed by CABG (LIMA to LAD patent, remaining grafts occluded), paroxysmal atrial fibrillation status post DCCV 2022, intermittent rectal bleeding, LV thrombus, hyperlipidemia, history of tobacco use in remission, and frequent PVCs presenting with worsening dyspnea approximately 3 to 4 days prior to admission.    Repeat echo shows depressed EF at 20 to 25% with global hypokinesia and low normal RVEF concerning for worsening status prompting visit to the hospital.  Patient transferred to [facility] for further evaluation and treatment.  Patient also has transient episodes of bleeding with bowel movements, notes bright red blood, also indicates this is consistent with his prior history of hemorrhoids.  He has not had a bloody bowel movement for approximately 1 week prior to admission -none documented during hospitalization.  Patient continues to complain of chest pain, continuous and nonradiating currently being managed by cardiology, may require further evaluation by transplant or LVAD team.  Assessment & Plan:   Principal Problem:   Acute on chronic HFrEF (heart failure with reduced ejection fraction) (HCC) Active Problems:   Coronary artery disease   Hypercholesterolemia   PVC (premature ventricular contraction)   AKI (acute kidney injury)  Acute on chronic HFrEF - 02/23/2024 echo EF 20-25%, global HK, low normal RVF - CVP was 3 mmHg on echo - Cardiology consulted recommending holding further diuretics in setting of mild increase of creatinine - Tentative plan for evaluation with LVAD team here, may benefit from transplant team evaluation at [facility] - Borderline hypotension likely to continue limiting GDMT - Continue Lasix , digoxin , Farxiga  - restart  isosorbide  -start Ranexa  - Entresto , spironolactone  discontinued  Hyperkalemia - acute (?on chronic) resolved - Patient Lokelma  on hold given discontinuation of Entresto  and spironolactone  - Follow repeat labs  Rectal bleeding, history of internal hemorrhoids, resolved - Appears to be self-limiting and intermittent, hemoglobin at baseline around 14 - No indication for GI consult at this time, if worsening hemoglobin or rectal bleeding would reach out to patient's established GI team, [place] with Dr. Federico - but currently reports last bloody BM was over a week ago - normal brown BM here since admit. - Heparin  drip for cath, transition back to apixaban  after procedure - Colonoscopy May 2023 confirmed internal hemorrhoids and polyps; upper endoscopy April 2025 unremarkable other than gastritis   Paroxysmal atrial fibrillation - Currently in normal sinus rhythm - Apixaban  held - currently on heparin    Hypotension - Patient states that his SBP has been running in the low 90s and upper 80s at home - Limiting goal-directed medical therapy as above, defer to cardiology   Obstructive coronary disease Early onset CAD s/p MI with LAD PCI in 2012,followed by CABG  x 4 4/22  - Plavix  discontinued in 2023; left heart cath being considered as above   Hyperlipidemia - Continue statin  DVT prophylaxis: SCDs Start: 02/23/24 1814 apixaban  (ELIQUIS ) tablet 5 mg  Code Status:   Code Status: Full Code Family Communication: None present  Status is: Inpatient  Dispo: The patient is from: Home              Anticipated d/c is to: Home              Anticipated d/c date is: Pending cardiac evaluation  Patient currently not medically stable for discharge  Consultants:  Cardiology  Procedures:  Left heart cath pending as above  Antimicrobials:  None indicated  Subjective: No acute issues or events overnight denies nausea vomiting diarrhea constipation headache fevers chills or chest  pain.  Objective: Vitals:   02/26/24 2007 02/27/24 0131 02/27/24 0139 02/27/24 0432  BP: 109/76   121/69  Pulse: 84     Resp: 18 18  17   Temp: 99.3 F (37.4 C) 98.6 F (37 C)  98.6 F (37 C)  TempSrc: Oral Oral  Oral  SpO2: 99% 97%  95%  Weight:   101.8 kg   Height:        Intake/Output Summary (Last 24 hours) at 02/27/2024 0659 Last data filed at 02/27/2024 0447 Gross per 24 hour  Intake 363 ml  Output 1925 ml  Net -1562 ml   Filed Weights   02/25/24 0401 02/26/24 0300 02/27/24 0139  Weight: 102.9 kg 102.1 kg 101.8 kg    Examination:  General:  Pleasantly resting in bed, No acute distress. HEENT:  Normocephalic atraumatic.  Sclerae nonicteric, noninjected.  Extraocular movements intact bilaterally. Neck:  Without mass or deformity.  Trachea is midline. Lungs:  Clear to auscultate bilaterally without rhonchi, wheeze, or rales. Heart:  Regular rate and rhythm.  Without murmurs, rubs, or gallops. Abdomen:  Soft, obese, nontender, nondistended.  Without guarding or rebound. Extremities: Without cyanosis, clubbing, edema, or obvious deformity. Skin:  Warm and dry, no erythema.  Data Reviewed: I have personally reviewed following labs and imaging studies  CBC: Recent Labs  Lab 02/23/24 1323 02/26/24 0328  WBC 9.5 10.0  HGB 14.5 14.6  HCT 44.2 44.9  MCV 93.8 92.6  PLT 277 238   Basic Metabolic Panel: Recent Labs  Lab 02/24/24 0258 02/25/24 0331 02/26/24 0328 02/26/24 1051 02/26/24 1626 02/27/24 0259  NA 137 135 135 132* 134* 132*  K 4.4 4.6 6.5* 5.6* 5.3* 4.1  CL 102 100 101 100 100 98  CO2 25 25 25  16* 23 24  GLUCOSE 99 101* 102* 102* 98 104*  BUN 26* 24* 24* 24* 23* 20  CREATININE 1.58* 1.19 1.21 1.18 1.15 1.09  CALCIUM  8.8* 8.9 9.5 9.5 9.1 8.6*  MG 2.4  --   --   --   --   --    GFR: Estimated Creatinine Clearance: 113 mL/min (by C-G formula based on SCr of 1.09 mg/dL). Liver Function Tests: Recent Labs  Lab 02/23/24 1323  AST 28  ALT 30   ALKPHOS 88  BILITOT 0.7  PROT 6.8  ALBUMIN  4.1   No results for input(s): LIPASE, AMYLASE in the last 168 hours. No results for input(s): AMMONIA in the last 168 hours. Coagulation Profile: No results for input(s): INR, PROTIME in the last 168 hours. Cardiac Enzymes: No results for input(s): CKTOTAL, CKMB, CKMBINDEX, TROPONINI in the last 168 hours. BNP (last 3 results) Recent Labs    02/23/24 1323  PROBNP 668.0*   HbA1C: No results for input(s): HGBA1C in the last 72 hours. CBG: Recent Labs  Lab 02/23/24 1333 02/26/24 0503 02/26/24 0554  GLUCAP 94 105* 155*   Lipid Profile: No results for input(s): CHOL, HDL, LDLCALC, TRIG, CHOLHDL, LDLDIRECT in the last 72 hours. Thyroid  Function Tests: No results for input(s): TSH, T4TOTAL, FREET4, T3FREE, THYROIDAB in the last 72 hours. Anemia Panel: No results for input(s): VITAMINB12, FOLATE, FERRITIN, TIBC, IRON, RETICCTPCT in the last 72 hours. Sepsis Labs: No results for input(s): PROCALCITON, LATICACIDVEN  in the last 168 hours.  No results found for this or any previous visit (from the past 240 hours).       Radiology Studies: No results found.       Scheduled Meds:  apixaban   5 mg Oral BID   atorvastatin   80 mg Oral Daily   dapagliflozin  propanediol  10 mg Oral Daily   digoxin   0.125 mg Oral Daily   furosemide   20 mg Oral Daily   mexiletine  200 mg Oral BID   pantoprazole   40 mg Oral BID AC   sodium chloride  flush  3 mL Intravenous Q12H   sodium zirconium cyclosilicate   5 g Oral Daily   Continuous Infusions:     LOS: 4 days   Time spent:  Wyatt JAYSON Montclair, DO [facility]  If 7PM-7AM, please contact night-coverage [email or web address]  02/27/2024, 6:59 AM      "

## 2024-02-28 ENCOUNTER — Ambulatory Visit (HOSPITAL_COMMUNITY): Admitting: Cardiology

## 2024-02-28 DIAGNOSIS — I5023 Acute on chronic systolic (congestive) heart failure: Secondary | ICD-10-CM | POA: Diagnosis not present

## 2024-02-28 LAB — BASIC METABOLIC PANEL WITH GFR
Anion gap: 10 (ref 5–15)
BUN: 23 mg/dL — ABNORMAL HIGH (ref 6–20)
CO2: 26 mmol/L (ref 22–32)
Calcium: 8.5 mg/dL — ABNORMAL LOW (ref 8.9–10.3)
Chloride: 101 mmol/L (ref 98–111)
Creatinine, Ser: 1.16 mg/dL (ref 0.61–1.24)
GFR, Estimated: >60 mL/min
Glucose, Bld: 110 mg/dL — ABNORMAL HIGH (ref 70–99)
Potassium: 4.4 mmol/L (ref 3.5–5.1)
Sodium: 136 mmol/L (ref 135–145)

## 2024-02-28 LAB — DIGOXIN LEVEL: Digoxin Level: 1.1 ng/mL (ref 0.8–2.0)

## 2024-02-28 MED ORDER — SACUBITRIL-VALSARTAN 24-26 MG PO TABS
1.0000 | ORAL_TABLET | Freq: Two times a day (BID) | ORAL | Status: DC
  Administered 2024-02-28 – 2024-03-01 (×5): 1 via ORAL
  Filled 2024-02-28 (×6): qty 1

## 2024-02-28 MED ORDER — CARVEDILOL 3.125 MG PO TABS
3.1250 mg | ORAL_TABLET | Freq: Two times a day (BID) | ORAL | Status: DC
  Administered 2024-02-28 – 2024-03-06 (×13): 3.125 mg via ORAL
  Filled 2024-02-28 (×15): qty 1

## 2024-02-28 MED ORDER — SPIRONOLACTONE 25 MG PO TABS
25.0000 mg | ORAL_TABLET | Freq: Every day | ORAL | Status: DC
  Administered 2024-02-28 – 2024-02-29 (×2): 25 mg via ORAL
  Filled 2024-02-28 (×2): qty 1

## 2024-02-28 MED ORDER — SODIUM ZIRCONIUM CYCLOSILICATE 5 G PO PACK
5.0000 g | PACK | Freq: Every day | ORAL | Status: DC
  Administered 2024-02-28: 5 g via ORAL
  Filled 2024-02-28 (×2): qty 1

## 2024-02-28 MED ORDER — APIXABAN 5 MG PO TABS
5.0000 mg | ORAL_TABLET | Freq: Two times a day (BID) | ORAL | Status: DC
  Administered 2024-02-28 – 2024-03-06 (×15): 5 mg via ORAL
  Filled 2024-02-28 (×15): qty 1

## 2024-02-28 MED ORDER — DIGOXIN 125 MCG PO TABS
0.0625 mg | ORAL_TABLET | Freq: Every day | ORAL | Status: DC
  Administered 2024-02-29: 0.0625 mg via ORAL
  Filled 2024-02-28: qty 1

## 2024-02-28 NOTE — Progress Notes (Signed)
 " PROGRESS NOTE    Wyatt Donovan  FMW:[id number] DOB: [birth year] DOA: 02/23/2024 PCP: Orpha Yancey LABOR, MD   Brief Narrative:  Wyatt Donovan is a 38 year old male with a history of HFrEF (EF 30-35%) status post AICD, CAD s/p PCI followed by CABG (LIMA to LAD patent, remaining grafts occluded), paroxysmal atrial fibrillation status post DCCV 2022, intermittent rectal bleeding, LV thrombus, hyperlipidemia, history of tobacco use in remission, and frequent PVCs presenting with worsening dyspnea approximately 3 to 4 days prior to admission.    Repeat echo shows depressed EF at 20 to 25% with global hypokinesia and low normal RVEF concerning for worsening status prompting visit to the hospital.  Patient transferred to [facility] for further evaluation and treatment.  Patient also has transient episodes of bleeding with bowel movements, notes bright red blood, also indicates this is consistent with his prior history of hemorrhoids.  He has not had a bloody bowel movement for approximately 1 week prior to admission -none documented during hospitalization.  Patient continues to complain of chest pain, continuous and nonradiating currently being managed by cardiology, may require further evaluation by transplant or LVAD team.  Assessment & Plan:   Principal Problem:   Acute on chronic HFrEF (heart failure with reduced ejection fraction) (HCC) Active Problems:   Coronary artery disease   Hypercholesterolemia   PVC (premature ventricular contraction)   AKI (acute kidney injury)  Acute on chronic HFrEF - 02/23/2024 echo EF 20-25%, global HK, low normal RVF - CVP was 3 mmHg on echo - Cardiology consulted -continue to adjust medications for ongoing management - Tentative plan for evaluation with LVAD team here, may benefit from transplant team evaluation at [facility] - Borderline hypotension likely to continue limiting GDMT - Continue Lasix , digoxin , Farxiga  - restart isosorbide  -start Ranexa  -  Entresto , spironolactone  discontinued  Hyperkalemia - acute (?on chronic) resolved - Follow repeat labs  Rectal bleeding, history of internal hemorrhoids, resolved - Appears to be self-limiting and intermittent, hemoglobin at baseline around 14 - No indication for GI consult at this time, if worsening hemoglobin or rectal bleeding would reach out to patient's established GI team, [place] with Dr. Federico - but currently reports last bloody BM was over a week ago - normal brown BM here since admit. - Heparin  drip for cath, transition back to apixaban  after procedure - Colonoscopy May 2023 confirmed internal hemorrhoids and polyps; upper endoscopy April 2025 unremarkable other than gastritis   Paroxysmal atrial fibrillation - Currently in normal sinus rhythm - Apixaban  held - currently on heparin    Hypotension - Patient states that his SBP has been running in the low 90s and upper 80s at home - Limiting goal-directed medical therapy as above, defer to cardiology   Obstructive coronary disease Early onset CAD s/p MI with LAD PCI in 2012,followed by CABG  x 4 4/22  - Plavix  discontinued in 2023; left heart cath being considered as above   Hyperlipidemia - Continue statin  DVT prophylaxis: SCDs Start: 02/23/24 1814  Code Status:   Code Status: Full Code Family Communication: None present  Status is: Inpatient  Dispo: The patient is from: Home              Anticipated d/c is to: Home              Anticipated d/c date is: Pending cardiac evaluation              Patient currently not medically stable for discharge  Consultants:  Cardiology  Procedures:  Left heart cath pending as above  Antimicrobials:  None indicated  Subjective: No acute issues or events overnight denies nausea vomiting diarrhea constipation headache fevers chills -chest pain appears to resolved overnight.  Objective: Vitals:   02/27/24 1530 02/27/24 1900 02/27/24 2300 02/28/24 0500  BP: 99/70 107/71  111/70 108/77  Pulse: 86 78 80 73  Resp: 17 18 18 19   Temp: 98.9 F (37.2 C) 99.1 F (37.3 C) 98.1 F (36.7 C) 98.5 F (36.9 C)  TempSrc: Oral Oral Oral Oral  SpO2: 95% 96% 99% 90%  Weight:    101.7 kg  Height:    6' 1 (1.854 m)    Intake/Output Summary (Last 24 hours) at 02/28/2024 0720 Last data filed at 02/28/2024 0100 Gross per 24 hour  Intake 483 ml  Output 700 ml  Net -217 ml   Filed Weights   02/26/24 0300 02/27/24 0139 02/28/24 0500  Weight: 102.1 kg 101.8 kg 101.7 kg    Examination:  General:  Pleasantly resting in bed, No acute distress. HEENT:  Normocephalic atraumatic.  Sclerae nonicteric, noninjected.  Extraocular movements intact bilaterally. Neck:  Without mass or deformity.  Trachea is midline. Lungs:  Clear to auscultate bilaterally without rhonchi, wheeze, or rales. Heart:  Regular rate and rhythm.  Without murmurs, rubs, or gallops. Abdomen:  Soft, obese, nontender, nondistended.  Without guarding or rebound. Extremities: Without cyanosis, clubbing, edema, or obvious deformity. Skin:  Warm and dry, no erythema.  Data Reviewed: I have personally reviewed following labs and imaging studies  CBC: Recent Labs  Lab 02/23/24 1323 02/26/24 0328  WBC 9.5 10.0  HGB 14.5 14.6  HCT 44.2 44.9  MCV 93.8 92.6  PLT 277 238   Basic Metabolic Panel: Recent Labs  Lab 02/24/24 0258 02/25/24 0331 02/26/24 0328 02/26/24 1051 02/26/24 1626 02/27/24 0259 02/28/24 0256  NA 137   < > 135 132* 134* 132* 136  K 4.4   < > 6.5* 5.6* 5.3* 4.1 4.4  CL 102   < > 101 100 100 98 101  CO2 25   < > 25 16* 23 24 26   GLUCOSE 99   < > 102* 102* 98 104* 110*  BUN 26*   < > 24* 24* 23* 20 23*  CREATININE 1.58*   < > 1.21 1.18 1.15 1.09 1.16  CALCIUM  8.8*   < > 9.5 9.5 9.1 8.6* 8.5*  MG 2.4  --   --   --   --   --   --    < > = values in this interval not displayed.   GFR: Estimated Creatinine Clearance: 106.1 mL/min (by C-G formula based on SCr of 1.16  mg/dL). Liver Function Tests: Recent Labs  Lab 02/23/24 1323  AST 28  ALT 30  ALKPHOS 88  BILITOT 0.7  PROT 6.8  ALBUMIN  4.1   No results for input(s): LIPASE, AMYLASE in the last 168 hours. No results for input(s): AMMONIA in the last 168 hours. Coagulation Profile: No results for input(s): INR, PROTIME in the last 168 hours. Cardiac Enzymes: No results for input(s): CKTOTAL, CKMB, CKMBINDEX, TROPONINI in the last 168 hours. BNP (last 3 results) Recent Labs    02/23/24 1323  PROBNP 668.0*   HbA1C: No results for input(s): HGBA1C in the last 72 hours. CBG: Recent Labs  Lab 02/23/24 1333 02/26/24 0503 02/26/24 0554  GLUCAP 94 105* 155*   Lipid Profile: No results for input(s): CHOL, HDL, LDLCALC, TRIG, CHOLHDL, LDLDIRECT  in the last 72 hours. Thyroid  Function Tests: No results for input(s): TSH, T4TOTAL, FREET4, T3FREE, THYROIDAB in the last 72 hours. Anemia Panel: No results for input(s): VITAMINB12, FOLATE, FERRITIN, TIBC, IRON, RETICCTPCT in the last 72 hours. Sepsis Labs: No results for input(s): PROCALCITON, LATICACIDVEN in the last 168 hours.  No results found for this or any previous visit (from the past 240 hours).       Radiology Studies: DG CHEST PORT 1 VIEW Result Date: 02/27/2024 CLINICAL DATA:  Pain. Patient admitted for acute on chronic heart failure. EXAM: PORTABLE CHEST 1 VIEW COMPARISON:  02/23/2024 FINDINGS: Status post median sternotomy. Stable heart size and mediastinal contours. Right and left pacemaker is in place. Minor atelectasis at the lung bases. No confluent opacity. No pulmonary edema, large pleural effusion or pneumothorax. IMPRESSION: Minor atelectasis at the lung bases. Electronically Signed   By: Andrea Gasman M.D.   On: 02/27/2024 10:42         Scheduled Meds:  atorvastatin   80 mg Oral Daily   dapagliflozin  propanediol  10 mg Oral Daily   digoxin   0.125 mg Oral  Daily   furosemide   20 mg Oral Daily   isosorbide  mononitrate  30 mg Oral Daily   mexiletine  200 mg Oral BID   pantoprazole   40 mg Oral BID AC   ranolazine   500 mg Oral BID   sodium chloride  flush  3 mL Intravenous Q12H   Continuous Infusions:     LOS: 5 days   Time spent:  Wyatt JAYSON Montclair, DO [facility]  If 7PM-7AM, please contact night-coverage [email or web address]  02/28/2024, 7:20 AM      "

## 2024-02-28 NOTE — Plan of Care (Signed)

## 2024-02-28 NOTE — Plan of Care (Signed)
   Problem: Clinical Measurements: Goal: Respiratory complications will improve Outcome: Progressing   Problem: Safety: Goal: Ability to remain free from injury will improve Outcome: Progressing

## 2024-02-28 NOTE — Progress Notes (Addendum)
 Patient ID: Wyatt Donovan, male   DOB: Jul 08, 1985, 38 y.o.   MRN: [id number]     Advanced Heart Failure Rounding Note  Cardiologist: Ezra Shuck, MD  Chief Complaint: Chest pain     No chest pain since yesterday.  Feels better.  Denies dyspnea.  Remains off his home meds.   HS-TnI 40 => 35 => 41 => 39   Objective:   Weight Range: 101.7 kg Body mass index is 29.58 kg/m.   Vital Signs:   Temp:  [97.8 F (36.6 C)-99.1 F (37.3 C)] 97.8 F (36.6 C) (12/29 0732) Pulse Rate:  [73-86] 78 (12/29 0732) Resp:  [16-20] 20 (12/29 0732) BP: (99-112)/(56-80) 103/80 (12/29 0732) SpO2:  [90 %-99 %] 94 % (12/29 0732) Weight:  [101.7 kg] 101.7 kg (12/29 0500) Last BM Date : 02/27/24  Weight change: Filed Weights   02/26/24 0300 02/27/24 0139 02/28/24 0500  Weight: 102.1 kg 101.8 kg 101.7 kg    Intake/Output:   Intake/Output Summary (Last 24 hours) at 02/28/2024 0737 Last data filed at 02/28/2024 0100 Gross per 24 hour  Intake 483 ml  Output 700 ml  Net -217 ml     Physical Exam   General: NAD Neck: No JVD, no thyromegaly or thyroid  nodule.  Lungs: Clear to auscultation bilaterally with normal respiratory effort. CV: Nondisplaced PMI.  Heart regular S1/S2, no S3/S4, no murmur.  No peripheral edema.   Abdomen: Soft, nontender, no hepatosplenomegaly, no distention.  Skin: Intact without lesions or rashes.  Neurologic: Alert and oriented x 3.  Psych: Normal affect. Extremities: No clubbing or cyanosis.  HEENT: Normal.   Telemetry   NSR with PVCs (personally reviewed)  Labs   CBC Recent Labs    02/26/24 0328  WBC 10.0  HGB 14.6  HCT 44.9  MCV 92.6  PLT 238   Basic Metabolic Panel Recent Labs    87/71/74 0259 02/28/24 0256  NA 132* 136  K 4.1 4.4  CL 98 101  CO2 24 26  GLUCOSE 104* 110*  BUN 20 23*  CREATININE 1.09 1.16  CALCIUM  8.6* 8.5*   Liver Function Tests No results for input(s): AST, ALT, ALKPHOS, BILITOT, PROT, ALBUMIN  in the  last 72 hours. No results for input(s): LIPASE, AMYLASE in the last 72 hours. Cardiac Enzymes No results for input(s): CKTOTAL, CKMB, CKMBINDEX, TROPONINI in the last 72 hours.  BNP: BNP (last 3 results) Recent Labs    04/27/23 0945 06/08/23 1040 01/31/24 0943  BNP 140.7* 135.4* 392.9*    ProBNP (last 3 results) Recent Labs    02/23/24 1323  PROBNP 668.0*     D-Dimer No results for input(s): DDIMER in the last 72 hours. Hemoglobin A1C No results for input(s): HGBA1C in the last 72 hours. Fasting Lipid Panel No results for input(s): CHOL, HDL, LDLCALC, TRIG, CHOLHDL, LDLDIRECT in the last 72 hours. Medications:   Scheduled Medications:  atorvastatin   80 mg Oral Daily   carvedilol   3.125 mg Oral BID WC   dapagliflozin  propanediol  10 mg Oral Daily   furosemide   20 mg Oral Daily   mexiletine  200 mg Oral BID   pantoprazole   40 mg Oral BID AC   ranolazine   500 mg Oral BID   sacubitril -valsartan   1 tablet Oral BID   sodium chloride  flush  3 mL Intravenous Q12H   sodium zirconium cyclosilicate   5 g Oral Daily   spironolactone   25 mg Oral Daily    Infusions:   PRN Medications: acetaminophen , albuterol ,  morphine  injection, nitroGLYCERIN , ondansetron  (ZOFRAN ) IV, polyethylene glycol, sodium chloride  flush  Assessment/Plan   1. PVCs: 22% on Zio in 12/25   - Now on mexiletine, will need repeat quantification.  - Ranolazine  was added, should help.  - Restart Coreg  3.125 bid.  2. CAD: Early onset CAD s/p MI with LAD PCI in 2012,followed by CABG  x 4 4/22  Admit 10/22 with NSTEMI, cath showed patent LIMA to LAD, all other grafts occluded. S/p PCI DES native mid LCx 99% stenosis. Repeat 01/2023 LHC showed coronary anatomy unchanged; grafts all occluded (known prior) except LIMA-LAD.  Occluded native RCA (known from prior) with patent mid LCx stent.  He had constant chest pain for > 1 week with minimally elevated troponin/no trend.  No ACS  suspected.  Pain has now completely resolved.  - He is now off Plavix  (stopped 8/23 due to BRBPR). - He is on Eliquis  so no ASA.   - Continue atorvastatin  80 mg daily.  - Continue ranolazine .  - Can stop Imdur  after restarting Coreg .  3. Acute on chronic systolic CHF: Ischemic cardiomyopathy.  [employer] ICD.  Cardiac MRI in 10/22 showed LVEF 29% and RVEF 45%.  Patient has a cardiac contractility modulator.  Echo 12/24 showed EF 30-35%, global hypokinesis, mild RV dysfunction, IVC not dilated.  GDMT has been limited by hypotension & hyperkalemia, K is now controlled with Lokelma .  Had RHC 12/24 showing normal filling pressures, low cardiac output with CI 2 by Fick, 1.82 by thermodilution. RHC in 12/25 with normal filling pressures but CI 2.02. Echo this admission with EF 20-25%, RV low normal function.  He was initially diuresed at admission, now back on po Lasix .  Looks euvolemic now.  GDMT was stopped due to hyperkalemia, but had been off his home Lokelma  for uncertain reasons when he developed hyperkalemia.  - Continue lasix  20 mg daily.  - Recheck digoxin  level today, lower to 0.0625 daily.   - Restart Coreg  3.125 mg bid.  - Continue Farxiga  10 mg daily.  - Restart spironolactone  25 mg daily and Entresto  24/26 bid, but will also restart Lokelma  5 mg daily.  Will watch today to make sure he tolerates and does not become hyperkalemic on BMET tomorrow.  - Nearing end stage ischemic cardiomyopathy.  I will refer to [facility] for transplant evaluation.  He would be a LVAD candidate if needed.  4. 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  No thrombus on echo 08/22. No thrombus on cMRI 12/06/20.  No thrombus on most recent echo.   - Continue Eliquis  5 mg bid.  5. Hyperkalemia: He has had a tendency towards hyperkalemia and has been on Lokelma  in order to stay on spironolactone  and Entresto .   - Restart Lokelma  5 g daily.  6. Atrial flutter/fibrillation: Both arrhythmias noted in 10/22.  He tolerated poorly  with worsened symptoms.  He had DCCV in 10/22. Previously on amiodarone  but later stopped. He is in NSR today.  - Continue Eliquis .  - Would favor ablation if he has recurrent AF.  7. Rectal bleeding: Red blood in stool.  Has had before from hemorrhoids.   - CBC tomorrow.   Length of Stay: 5  Ezra Shuck, MD  02/28/2024, 7:37 AM  Advanced Heart Failure Team Pager [phone] (M-F; 7a - 5p)   Please visit [email or web address]: For overnight coverage please call cardiology fellow first. If fellow not available call Shock/ECMO MD on call.  For ECMO / Mechanical Support (Impella, IABP, LVAD) issues call Shock /  ECMO MD on call.

## 2024-02-28 NOTE — Plan of Care (Signed)
   Problem: Clinical Measurements: Goal: Cardiovascular complication will be avoided Outcome: Progressing   Problem: Activity: Goal: Risk for activity intolerance will decrease Outcome: Progressing

## 2024-02-29 ENCOUNTER — Inpatient Hospital Stay (HOSPITAL_COMMUNITY)

## 2024-02-29 ENCOUNTER — Other Ambulatory Visit (HOSPITAL_COMMUNITY): Payer: Self-pay

## 2024-02-29 ENCOUNTER — Telehealth (HOSPITAL_COMMUNITY): Payer: Self-pay

## 2024-02-29 DIAGNOSIS — I5023 Acute on chronic systolic (congestive) heart failure: Secondary | ICD-10-CM | POA: Diagnosis not present

## 2024-02-29 LAB — BASIC METABOLIC PANEL WITH GFR
Anion gap: 10 (ref 5–15)
BUN: 24 mg/dL — ABNORMAL HIGH (ref 6–20)
CO2: 25 mmol/L (ref 22–32)
Calcium: 8.9 mg/dL (ref 8.9–10.3)
Chloride: 104 mmol/L (ref 98–111)
Creatinine, Ser: 1.22 mg/dL (ref 0.61–1.24)
GFR, Estimated: >60 mL/min
Glucose, Bld: 104 mg/dL — ABNORMAL HIGH (ref 70–99)
Potassium: 5.2 mmol/L — ABNORMAL HIGH (ref 3.5–5.1)
Sodium: 139 mmol/L (ref 135–145)

## 2024-02-29 LAB — CBC
HCT: 41.8 % (ref 39.0–52.0)
Hemoglobin: 13.5 g/dL (ref 13.0–17.0)
MCH: 30.6 pg (ref 26.0–34.0)
MCHC: 32.3 g/dL (ref 30.0–36.0)
MCV: 94.8 fL (ref 80.0–100.0)
Platelets: 237 K/uL (ref 150–400)
RBC: 4.41 MIL/uL (ref 4.22–5.81)
RDW: 15.9 % — ABNORMAL HIGH (ref 11.5–15.5)
WBC: 7.7 K/uL (ref 4.0–10.5)
nRBC: 0 % (ref 0.0–0.2)

## 2024-02-29 MED ORDER — POLYETHYLENE GLYCOL 3350 17 G PO PACK
17.0000 g | PACK | Freq: Two times a day (BID) | ORAL | Status: DC
  Administered 2024-02-29 (×2): 17 g via ORAL
  Filled 2024-02-29 (×2): qty 1

## 2024-02-29 MED ORDER — SODIUM ZIRCONIUM CYCLOSILICATE 10 G PO PACK
10.0000 g | PACK | Freq: Every day | ORAL | Status: DC
  Administered 2024-02-29 – 2024-03-01 (×2): 10 g via ORAL
  Filled 2024-02-29 (×2): qty 1

## 2024-02-29 MED ORDER — SENNOSIDES-DOCUSATE SODIUM 8.6-50 MG PO TABS
1.0000 | ORAL_TABLET | Freq: Two times a day (BID) | ORAL | Status: DC
  Administered 2024-02-29 – 2024-03-03 (×4): 1 via ORAL
  Filled 2024-02-29 (×8): qty 1

## 2024-02-29 NOTE — Progress Notes (Signed)
 " PROGRESS NOTE    UZIEL COVAULT  FMW:[id number] DOB: 1986-02-15 DOA: 02/23/2024 PCP: Orpha Yancey LABOR, MD   Brief Narrative:  ADVAIT BUICE is a 38 year old male with a history of HFrEF (EF 30-35%) status post AICD, CAD s/p PCI followed by CABG (LIMA to LAD patent, remaining grafts occluded), paroxysmal atrial fibrillation status post DCCV 2022, intermittent rectal bleeding, LV thrombus, hyperlipidemia, history of tobacco use in remission, and frequent PVCs presenting with worsening dyspnea approximately 3 to 4 days prior to admission.    Repeat echo shows depressed EF at 20 to 25% with global hypokinesia and low normal RVEF concerning for worsening status prompting visit to the hospital.  Patient transferred to [facility] for further evaluation and treatment.  Patient also has transient episodes of bleeding with bowel movements, notes bright red blood, also indicates this is consistent with his prior history of hemorrhoids.  He has not had a bloody bowel movement for approximately 1 week prior to admission -none documented during hospitalization.  Patient continues to complain of chest pain, continuous and nonradiating currently being managed by cardiology, may require further evaluation by transplant outpatient with [facility] or with LVAD team while here in the hospital.  Assessment & Plan:   Principal Problem:   Acute on chronic HFrEF (heart failure with reduced ejection fraction) (HCC) Active Problems:   Coronary artery disease   Hypercholesterolemia   PVC (premature ventricular contraction)   AKI (acute kidney injury)  Acute on chronic HFrEF - 02/23/2024 echo EF 20-25%, global HK, low normal RVF - CVP was 3 mmHg on echo - Cardiology consulted -continue to adjust medications for ongoing management - Tentative plan for evaluation with LVAD team here, may benefit from transplant team evaluation at [facility] - Borderline hypotension likely to continue limiting GDMT - Continue Lasix ,  digoxin , Farxiga  - restart isosorbide  - start Ranexa  - Entresto , spironolactone  discontinued  Hyperkalemia - acute (?on chronic) resolved - Follow repeat labs - Patient's home Lokelma  previously held, restarted 12/29, follow repeat labs  Rectal bleeding, history of internal hemorrhoids, resolved - Appears to be self-limiting and intermittent, hemoglobin at baseline around 14 - No indication for GI consult at this time, if worsening hemoglobin or rectal bleeding would reach out to patient's established GI team, [place] with Dr. Federico - but currently reports last bloody BM was over a week ago - normal brown BM here since admit. - Heparin  drip for cath, transition back to apixaban  after procedure - Colonoscopy May 2023 confirmed internal hemorrhoids and polyps; upper endoscopy April 2025 unremarkable other than gastritis   Paroxysmal atrial fibrillation - Currently in normal sinus rhythm - Apixaban  held - currently on heparin    Hypotension - Patient states that his SBP has been running in the low 90s and upper 80s at home - Limiting goal-directed medical therapy as above, defer to cardiology   Obstructive coronary disease Early onset CAD s/p MI with LAD PCI in 2012,followed by CABG  x 4 4/22  - Plavix  discontinued in 2023; left heart cath being considered as above   Hyperlipidemia - Continue statin  DVT prophylaxis: SCDs Start: 02/23/24 1814 apixaban  (ELIQUIS ) tablet 5 mg  Code Status:   Code Status: Full Code Family Communication: None present  Status is: Inpatient  Dispo: The patient is from: Home              Anticipated d/c is to: Home              Anticipated d/c date is:  Pending cardiac evaluation              Patient currently not medically stable for discharge  Consultants:  Cardiology  Procedures:  Left heart cath pending as above  Antimicrobials:  None indicated  Subjective: No acute issues or events overnight -chest pain resolved - denies nausea vomiting  diarrhea constipation headache fevers chills  Objective: Vitals:   02/28/24 1900 02/28/24 1935 02/28/24 2328 02/29/24 0300  BP: (!) 88/63 92/63 92/60  101/66  Pulse: (!) 33 72 67 74  Resp: 20  19 19   Temp: 98.5 F (36.9 C)  98.5 F (36.9 C) 98.6 F (37 C)  TempSrc: Oral  Oral Oral  SpO2: 92%  92% 96%  Weight:    101.5 kg  Height:    6' 1 (1.854 m)    Intake/Output Summary (Last 24 hours) at 02/29/2024 0656 Last data filed at 02/28/2024 2300 Gross per 24 hour  Intake 897 ml  Output 975 ml  Net -78 ml   Filed Weights   02/27/24 0139 02/28/24 0500 02/29/24 0300  Weight: 101.8 kg 101.7 kg 101.5 kg    Examination:  General:  Pleasantly resting in bed, No acute distress. HEENT:  Normocephalic atraumatic.  Sclerae nonicteric, noninjected.  Extraocular movements intact bilaterally. Neck:  Without mass or deformity.  Trachea is midline. Lungs:  Clear to auscultate bilaterally without rhonchi, wheeze, or rales. Heart:  Regular rate and rhythm.  Without murmurs, rubs, or gallops. Abdomen:  Soft, obese, nontender, nondistended.  Without guarding or rebound. Extremities: Without cyanosis, clubbing, edema, or obvious deformity. Skin:  Warm and dry, no erythema.  Data Reviewed: I have personally reviewed following labs and imaging studies  CBC: Recent Labs  Lab 02/23/24 1323 02/26/24 0328 02/29/24 0325  WBC 9.5 10.0 7.7  HGB 14.5 14.6 13.5  HCT 44.2 44.9 41.8  MCV 93.8 92.6 94.8  PLT 277 238 237   Basic Metabolic Panel: Recent Labs  Lab 02/24/24 0258 02/25/24 0331 02/26/24 1051 02/26/24 1626 02/27/24 0259 02/28/24 0256 02/29/24 0325  NA 137   < > 132* 134* 132* 136 139  K 4.4   < > 5.6* 5.3* 4.1 4.4 5.2*  CL 102   < > 100 100 98 101 104  CO2 25   < > 16* 23 24 26 25   GLUCOSE 99   < > 102* 98 104* 110* 104*  BUN 26*   < > 24* 23* 20 23* 24*  CREATININE 1.58*   < > 1.18 1.15 1.09 1.16 1.22  CALCIUM  8.8*   < > 9.5 9.1 8.6* 8.5* 8.9  MG 2.4  --   --   --   --    --   --    < > = values in this interval not displayed.   GFR: Estimated Creatinine Clearance: 100.8 mL/min (by C-G formula based on SCr of 1.22 mg/dL). Liver Function Tests: Recent Labs  Lab 02/23/24 1323  AST 28  ALT 30  ALKPHOS 88  BILITOT 0.7  PROT 6.8  ALBUMIN  4.1   No results for input(s): LIPASE, AMYLASE in the last 168 hours. No results for input(s): AMMONIA in the last 168 hours. Coagulation Profile: No results for input(s): INR, PROTIME in the last 168 hours. Cardiac Enzymes: No results for input(s): CKTOTAL, CKMB, CKMBINDEX, TROPONINI in the last 168 hours. BNP (last 3 results) Recent Labs    02/23/24 1323  PROBNP 668.0*   HbA1C: No results for input(s): HGBA1C in the last 72 hours.  CBG: Recent Labs  Lab 02/23/24 1333 02/26/24 0503 02/26/24 0554  GLUCAP 94 105* 155*   Lipid Profile: No results for input(s): CHOL, HDL, LDLCALC, TRIG, CHOLHDL, LDLDIRECT in the last 72 hours. Thyroid  Function Tests: No results for input(s): TSH, T4TOTAL, FREET4, T3FREE, THYROIDAB in the last 72 hours. Anemia Panel: No results for input(s): VITAMINB12, FOLATE, FERRITIN, TIBC, IRON, RETICCTPCT in the last 72 hours. Sepsis Labs: No results for input(s): PROCALCITON, LATICACIDVEN in the last 168 hours.  No results found for this or any previous visit (from the past 240 hours).       Radiology Studies: No results found.        Scheduled Meds:  apixaban   5 mg Oral BID   atorvastatin   80 mg Oral Daily   carvedilol   3.125 mg Oral BID WC   dapagliflozin  propanediol  10 mg Oral Daily   digoxin   0.0625 mg Oral Daily   furosemide   20 mg Oral Daily   mexiletine  200 mg Oral BID   pantoprazole   40 mg Oral BID AC   ranolazine   500 mg Oral BID   sacubitril -valsartan   1 tablet Oral BID   sodium chloride  flush  3 mL Intravenous Q12H   sodium zirconium cyclosilicate   5 g Oral Daily   spironolactone   25 mg Oral  Daily   Continuous Infusions:     LOS: 6 days   Time spent:  Elsie JAYSON Montclair, DO [facility]  If 7PM-7AM, please contact night-coverage [email or web address]  02/29/2024, 6:56 AM      "

## 2024-02-29 NOTE — Progress Notes (Addendum)
 Patient ID: Wyatt Donovan, male   DOB: [birth year], 38 y.o.   MRN: [id number]     Advanced Heart Failure Rounding Note  Cardiologist: Ezra Shuck, MD  Chief Complaint: Chest pain     Feels good this morning, meds restarted yesterday. SBP 90s. Denies dizziness.   Objective:   Weight Range: 101.5 kg Body mass index is 29.52 kg/m.   Vital Signs:   Temp:  [98 F (36.7 C)-98.6 F (37 C)] 98 F (36.7 C) (12/30 0733) Pulse Rate:  [33-80] 74 (12/30 0300) Resp:  [17-20] 17 (12/30 0733) BP: (88-101)/(52-70) 97/67 (12/30 0733) SpO2:  [92 %-98 %] 96 % (12/30 0300) Weight:  [101.5 kg] 101.5 kg (12/30 0300) Last BM Date : 02/28/24  Weight change: Filed Weights   02/27/24 0139 02/28/24 0500 02/29/24 0300  Weight: 101.8 kg 101.7 kg 101.5 kg    Intake/Output:   Intake/Output Summary (Last 24 hours) at 02/29/2024 0829 Last data filed at 02/28/2024 2300 Gross per 24 hour  Intake 897 ml  Output 975 ml  Net -78 ml     Physical Exam  General:  well appearing.  No respiratory difficulty Neck: JVD flat.  Cor: Regular rate & rhythm. No murmurs. Lungs: clear Extremities: no edema  Neuro: alert & oriented x 3. Affect pleasant.   Telemetry   NSR 60s with 0-1 PVCs (personally reviewed)  Labs   CBC Recent Labs    02/29/24 0325  WBC 7.7  HGB 13.5  HCT 41.8  MCV 94.8  PLT 237   Basic Metabolic Panel Recent Labs    87/70/74 0256 02/29/24 0325  NA 136 139  K 4.4 5.2*  CL 101 104  CO2 26 25  GLUCOSE 110* 104*  BUN 23* 24*  CREATININE 1.16 1.22  CALCIUM  8.5* 8.9   Liver Function Tests No results for input(s): AST, ALT, ALKPHOS, BILITOT, PROT, ALBUMIN  in the last 72 hours. No results for input(s): LIPASE, AMYLASE in the last 72 hours. Cardiac Enzymes No results for input(s): CKTOTAL, CKMB, CKMBINDEX, TROPONINI in the last 72 hours.  BNP: BNP (last 3 results) Recent Labs    04/27/23 0945 06/08/23 1040 01/31/24 0943  BNP 140.7* 135.4*  392.9*    ProBNP (last 3 results) Recent Labs    02/23/24 1323  PROBNP 668.0*     D-Dimer No results for input(s): DDIMER in the last 72 hours. Hemoglobin A1C No results for input(s): HGBA1C in the last 72 hours. Fasting Lipid Panel No results for input(s): CHOL, HDL, LDLCALC, TRIG, CHOLHDL, LDLDIRECT in the last 72 hours. Medications:   Scheduled Medications:  apixaban   5 mg Oral BID   atorvastatin   80 mg Oral Daily   carvedilol   3.125 mg Oral BID WC   dapagliflozin  propanediol  10 mg Oral Daily   digoxin   0.0625 mg Oral Daily   furosemide   20 mg Oral Daily   mexiletine  200 mg Oral BID   pantoprazole   40 mg Oral BID AC   ranolazine   500 mg Oral BID   sacubitril -valsartan   1 tablet Oral BID   sodium chloride  flush  3 mL Intravenous Q12H   sodium zirconium cyclosilicate   10 g Oral Daily   spironolactone   25 mg Oral Daily    Infusions:   PRN Medications: acetaminophen , albuterol , morphine  injection, nitroGLYCERIN , ondansetron  (ZOFRAN ) IV, polyethylene glycol, sodium chloride  flush  Assessment/Plan   1. PVCs: 22% on Zio in 12/25   - Now on mexiletine, will need repeat quantification.  - Ranolazine  was  added, should help.  - Continue Coreg  3.125 bid.   2. CAD: Early onset CAD s/p MI with LAD PCI in 2012,followed by CABG  x 4 4/22  Admit 10/22 with NSTEMI, cath showed patent LIMA to LAD, all other grafts occluded. S/p PCI DES native mid LCx 99% stenosis. Repeat 01/2023 LHC showed coronary anatomy unchanged; grafts all occluded (known prior) except LIMA-LAD.  Occluded native RCA (known from prior) with patent mid LCx stent.  He had constant chest pain for > 1 week with minimally elevated troponin/no trend.  No ACS suspected.  Pain has now completely resolved.  - He is now off Plavix  (stopped 8/23 due to BRBPR). - He is on Eliquis  so no ASA.   - Continue atorvastatin  80 mg daily.  - Continue ranolazine .  - Off Imdur  with Coreg .   3. Acute on chronic  systolic CHF: Ischemic cardiomyopathy.  [employer] ICD.  Cardiac MRI in 10/22 showed LVEF 29% and RVEF 45%.  Patient has a cardiac contractility modulator.  Echo 12/24 showed EF 30-35%, global hypokinesis, mild RV dysfunction, IVC not dilated.  GDMT has been limited by hypotension & hyperkalemia, K is now controlled with Lokelma .  Had RHC 12/24 showing normal filling pressures, low cardiac output with CI 2 by Fick, 1.82 by thermodilution. RHC in 12/25 with normal filling pressures but CI 2.02. Echo this admission with EF 20-25%, RV low normal function.  He was initially diuresed at admission, now back on po Lasix .  Looks euvolemic now.  GDMT was stopped due to hyperkalemia, but had been off his home Lokelma  for uncertain reasons when he developed hyperkalemia. Now restarted yesterday.  - Continue lasix  20 mg daily.  - Continue 0.0625 daily, dose decreased. Level 1.1 02/28/24. Repeat level at f/u.  - Continue Coreg  3.125 mg bid.  - Continue Farxiga  10 mg daily.  - Continue spironolactone  25 mg daily  - Continue Entresto  24/26 bid - Nearing end stage ischemic cardiomyopathy.  Has been referred to [facility] for transplant evaluation.  He would be a LVAD candidate if needed.   4. 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  No thrombus on echo 08/22. No thrombus on cMRI 12/06/20.  No thrombus on most recent echo.   - Continue Eliquis  5 mg bid.   5. Hyperkalemia: He has had a tendency towards hyperkalemia and has been on Lokelma  in order to stay on spironolactone  and Entresto .   - Increase Lokelma  5>10 g daily. K 5.2 today.   6. Atrial flutter/fibrillation: Both arrhythmias noted in 10/22.  He tolerated poorly with worsened symptoms.  He had DCCV in 10/22. Previously on amiodarone  but later stopped. He is in NSR today.  - Continue Eliquis .  - Would favor ablation if he has recurrent AF.   7. Rectal bleeding: Red blood in stool.  Has had before from hemorrhoids.   - CBC stable. Hgb 13.5  Stable for discharge  today. Please send meds to TOC. Will arrange close follow up in AHF clinic.   AHF meds at discharge:  Eliquis  5 mg BID Atorvastatin  80 mg daily Coreg  3.125 mg BID Farxiga  10 mg daily Digoxin  0.0625 mg daily Lasix  20 mg daily Ranolazine  500 mg BID Mexiletine 200 mg BID Entresto  24-26 mg BID Lokelma  10 mg daily Spironolactone  25 mg daily Entresto  24/26 bid  Length of Stay: 6  Beckey LITTIE Coe, NP  02/29/2024, 8:29 AM  Advanced Heart Failure Team Pager [phone] (M-F; 7a - 5p)   Please visit [email or web address]: For overnight coverage  please call cardiology fellow first. If fellow not available call Shock/ECMO MD on call.  For ECMO / Mechanical Support (Impella, IABP, LVAD) issues call Shock / ECMO MD on call.   Patient seen with NP, I formulated the plan and agree with the above note.   No chest pain or dyspnea this morning.  Feeling much better.    K is 5.2, got Lokelma  5 g yesterday.    General: NAD Neck: No JVD, no thyromegaly or thyroid  nodule.  Lungs: Clear to auscultation bilaterally with normal respiratory effort. CV: Nondisplaced PMI.  Heart regular S1/S2, no S3/S4, no murmur.  No peripheral edema.   Abdomen: Soft, nontender, no hepatosplenomegaly, no distention.  Skin: Intact without lesions or rashes.  Neurologic: Alert and oriented x 3.  Psych: Normal affect. Extremities: No clubbing or cyanosis.  HEENT: Normal.   I think that Mr Houseman is ready to go home on the above regimen. We are going to increase Lokelma  to 10 g daily to allow him to continue Entresto  and spironolactone .  His SBP runs around 90 at all times, SBP 85 and above is ok for him in setting of end stage cardiomyopathy.  Volume looks ok on Lasix  20 mg daily.  He has been referred for [facility] evaluation at [facility].  Will need to be on lower dose digoxin  0.0625 daily at discharge due to elevated level.   He is now on mexiletine and ranolazine  for PVCs, having less PVCs on telemetry today.  Needs to continue Coreg   3.125 mg bid.  Will set up for repeat Zio at followup appointment to allow requantification of PVCs, may need to switch from mexiletine to amiodarone .   Ok for home today on the above medication regimen.  Has followup in CHF clinic.   Ezra Shuck 02/29/2024 9:03 AM

## 2024-02-29 NOTE — Plan of Care (Signed)
" °  Problem: Education: Goal: Knowledge of General Education information will improve Description: Including pain rating scale, medication(s)/side effects and non-pharmacologic comfort measures 02/29/2024 1748 by Tad Delon SAUNDERS, RN Outcome: Progressing 02/29/2024 1748 by Tad Delon SAUNDERS, RN Outcome: Progressing   Problem: Health Behavior/Discharge Planning: Goal: Ability to manage health-related needs will improve 02/29/2024 1748 by Tad Delon SAUNDERS, RN Outcome: Progressing 02/29/2024 1748 by Tad Delon SAUNDERS, RN Outcome: Progressing   Problem: Clinical Measurements: Goal: Ability to maintain clinical measurements within normal limits will improve 02/29/2024 1748 by Tad Delon SAUNDERS, RN Outcome: Progressing 02/29/2024 1748 by Tad Delon SAUNDERS, RN Outcome: Progressing Goal: Will remain free from infection 02/29/2024 1748 by Tad Delon SAUNDERS, RN Outcome: Progressing 02/29/2024 1748 by Tad Delon SAUNDERS, RN Outcome: Progressing Goal: Diagnostic test results will improve 02/29/2024 1748 by Tad Delon SAUNDERS, RN Outcome: Progressing 02/29/2024 1748 by Tad Delon SAUNDERS, RN Outcome: Progressing Goal: Respiratory complications will improve 02/29/2024 1748 by Tad Delon SAUNDERS, RN Outcome: Progressing 02/29/2024 1748 by Tad Delon SAUNDERS, RN Outcome: Progressing Goal: Cardiovascular complication will be avoided 02/29/2024 1748 by Tad Delon SAUNDERS, RN Outcome: Progressing 02/29/2024 1748 by Tad Delon SAUNDERS, RN Outcome: Progressing   Problem: Activity: Goal: Risk for activity intolerance will decrease 02/29/2024 1748 by Tad Delon SAUNDERS, RN Outcome: Progressing 02/29/2024 1748 by Tad Delon SAUNDERS, RN Outcome: Progressing   Problem: Nutrition: Goal: Adequate nutrition will be maintained 02/29/2024 1748 by Tad Delon SAUNDERS, RN Outcome: Progressing 02/29/2024 1748 by Tad Delon SAUNDERS, RN Outcome: Progressing   Problem: Coping: Goal: Level  of anxiety will decrease 02/29/2024 1748 by Tad Delon SAUNDERS, RN Outcome: Progressing 02/29/2024 1748 by Tad Delon SAUNDERS, RN Outcome: Progressing   Problem: Elimination: Goal: Will not experience complications related to bowel motility 02/29/2024 1748 by Tad Delon SAUNDERS, RN Outcome: Progressing 02/29/2024 1748 by Tad Delon SAUNDERS, RN Outcome: Progressing Goal: Will not experience complications related to urinary retention 02/29/2024 1748 by Tad Delon SAUNDERS, RN Outcome: Progressing 02/29/2024 1748 by Tad Delon SAUNDERS, RN Outcome: Progressing   Problem: Pain Managment: Goal: General experience of comfort will improve and/or be controlled 02/29/2024 1748 by Tad Delon SAUNDERS, RN Outcome: Progressing 02/29/2024 1748 by Tad Delon SAUNDERS, RN Outcome: Progressing   Problem: Safety: Goal: Ability to remain free from injury will improve 02/29/2024 1748 by Tad Delon SAUNDERS, RN Outcome: Progressing 02/29/2024 1748 by Tad Delon SAUNDERS, RN Outcome: Progressing   Problem: Skin Integrity: Goal: Risk for impaired skin integrity will decrease 02/29/2024 1748 by Tad Delon SAUNDERS, RN Outcome: Progressing 02/29/2024 1748 by Tad Delon SAUNDERS, RN Outcome: Progressing   "

## 2024-02-29 NOTE — Progress Notes (Signed)
 Paged Jon Hails, PA as tele called to stated that the patient's HR was dropping to 30. Patient is asymptomatic. Leads check and replaced as they were not all in place. Patient now sustaining HR in high 50s to 70s.   1720  PA [facility] on the floor to check patient's rhythm. Patient HR remains stable in the 60s and 70s.

## 2024-02-29 NOTE — Telephone Encounter (Signed)
 Pharmacy Patient Advocate Encounter  Insurance verification completed.    The patient is insured through [facility] [place].     Ran test claim for Lokelma  10G packet and the current 30 day co-pay is $4.   This test claim was processed through [employer]- copay amounts may vary at other pharmacies due to [employer], or as the patient moves through the different stages of their insurance plan.

## 2024-02-29 NOTE — Plan of Care (Signed)

## 2024-02-29 NOTE — TOC Transition Note (Signed)
 Transition of Care [facility]) - Discharge Note   Patient Details  Name: Wyatt Donovan MRN: [id number] Date of Birth: 02/28/1986  [facility] CM/SW Contact:  Arlana JINNY Nicholaus ISRAEL Phone Number: [phone] 02/29/2024, 9:40 AM   Clinical Narrative:   [facility] CSW called and schedule patients hospital PCP appointment for March 06, 2024 at 2:00 PM. Patient stated that family will provide transportation home.           Patient Goals and CMS Choice            Discharge Placement                       Discharge Plan and Services Additional resources added to the After Visit Summary for                                       Social Drivers of Health (SDOH) Interventions SDOH Screenings   Food Insecurity: No Food Insecurity (02/23/2024)  Housing: Low Risk (02/23/2024)  Transportation Needs: No Transportation Needs (02/23/2024)  Utilities: Not At Risk (02/23/2024)  Depression (PHQ2-9): Medium Risk (04/14/2021)  Tobacco Use: Medium Risk (02/23/2024)     Readmission Risk Interventions     No data to display

## 2024-02-29 NOTE — TOC Initial Note (Signed)
 Transition of Care [facility]) - Initial/Assessment Note    Patient Details  Name: Wyatt Donovan MRN: [id number] Date of Birth: 04-Jan-1986  Transition of Care [facility]) CM/SW Contact:    Justina Delcia Czar, RN Phone Number: [phone] 02/29/2024, 1:15 PM  Clinical Narrative:                 Spoke to pt at bedside. Pt states he lives with father. Father takes him to appts. States he is following up with [facility] for heart transplant.  He has applied for SS disability.  Patient has a scale for daily weight monitoring and provided with the Living Better with Heart Failure booklet. Patient educated on the importance of daily weights, medication adherence, and following a low-sodium, heart-healthy diet.   His family will provide transportation home.     Expected Discharge Plan: Home/Self Care Barriers to Discharge: No Barriers Identified   Patient Goals and CMS Choice Patient states their goals for this hospitalization and ongoing recovery are:: wants to recover   Expected Discharge Plan and Services   Discharge Planning Services: CM Consult     Prior Living Arrangements/Services   Lives with:: Parents Patient language and need for interpreter reviewed:: Yes Do you feel safe going back to the place where you live?: Yes      Need for Family Participation in Patient Care: No (Comment) Care giver support system in place?: No (comment) Current home services: DME (scale) Criminal Activity/Legal Involvement Pertinent to Current Situation/Hospitalization: No - Comment as needed  Activities of Daily Living   ADL Screening (condition at time of admission) Independently performs ADLs?: Yes (appropriate for developmental age) Is the patient deaf or have difficulty hearing?: No Does the patient have difficulty seeing, even when wearing glasses/contacts?: No Does the patient have difficulty concentrating, remembering, or making decisions?: No  Permission Sought/Granted Permission sought  to share information with : Case Manager, Family Supports, PCP Permission granted to share information with : Yes, Verbal Permission Granted  Share Information with NAME: Catcher Dehoyos  Permission granted to share info w AGENCY: PCP, DME  Permission granted to share info w Relationship: father  Permission granted to share info w Contact Information: [phone]  Emotional Assessment Appearance:: Appears stated age Attitude/Demeanor/Rapport: Engaged Affect (typically observed): Accepting Orientation: : Oriented to Self, Oriented to Place, Oriented to  Time, Oriented to Situation   Psych Involvement: No (comment)  Admission diagnosis:  SOB (shortness of breath) [R06.02] Cardiomyopathy, unspecified type (HCC) [I42.9] Acute on chronic HFrEF (heart failure with reduced ejection fraction) (HCC) [I50.23] Patient Active Problem List   Diagnosis Date Noted   PVC (premature ventricular contraction) 02/24/2024   AKI (acute kidney injury) 02/24/2024   Acute on chronic HFrEF (heart failure with reduced ejection fraction) (HCC) 02/23/2024   Abdominal pain, epigastric 06/24/2023   Nausea and vomiting 03/20/2023   Constipation 03/20/2023   RUQ pain 03/19/2023   CAP (community acquired pneumonia) 03/19/2023   Gastritis determined by endoscopy 10/24/2022   Unstable angina (HCC) 10/23/2022   Chronic systolic heart failure (HCC) 04/17/2022   Ischemic cardiomyopathy 02/10/2021   Hypercholesterolemia 12/03/2020   Chronic systolic CHF (congestive heart failure) (HCC) 12/03/2020   NSTEMI (non-ST elevated myocardial infarction) (HCC) 12/03/2020   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 - [place], [place] - [address]  [address] [place] [address] Phone: [phone] Fax: [phone]     Social Drivers of Health (SDOH) Social  History: SDOH Screenings   Food Insecurity: No Food Insecurity (02/23/2024)  Housing: Low Risk (02/23/2024)  Transportation Needs: No Transportation Needs (02/23/2024)  Utilities: Not At Risk (02/23/2024)  Depression (PHQ2-9): Medium Risk (04/14/2021)  Tobacco Use: Medium Risk (02/23/2024)   SDOH Interventions:     Readmission Risk Interventions     No data to display

## 2024-03-01 ENCOUNTER — Inpatient Hospital Stay (HOSPITAL_COMMUNITY)

## 2024-03-01 ENCOUNTER — Ambulatory Visit: Payer: Self-pay | Admitting: Cardiovascular Disease

## 2024-03-01 DIAGNOSIS — I5023 Acute on chronic systolic (congestive) heart failure: Secondary | ICD-10-CM | POA: Diagnosis not present

## 2024-03-01 LAB — BASIC METABOLIC PANEL WITH GFR
Anion gap: 12 (ref 5–15)
BUN: 27 mg/dL — ABNORMAL HIGH (ref 6–20)
CO2: 26 mmol/L (ref 22–32)
Calcium: 9.4 mg/dL (ref 8.9–10.3)
Chloride: 99 mmol/L (ref 98–111)
Creatinine, Ser: 1.27 mg/dL — ABNORMAL HIGH (ref 0.61–1.24)
GFR, Estimated: >60 mL/min
Glucose, Bld: 97 mg/dL (ref 70–99)
Potassium: 5.5 mmol/L — ABNORMAL HIGH (ref 3.5–5.1)
Sodium: 137 mmol/L (ref 135–145)

## 2024-03-01 LAB — CBC
HCT: 47.8 % (ref 39.0–52.0)
Hemoglobin: 15.3 g/dL (ref 13.0–17.0)
MCH: 30.5 pg (ref 26.0–34.0)
MCHC: 32 g/dL (ref 30.0–36.0)
MCV: 95.2 fL (ref 80.0–100.0)
Platelets: 294 K/uL (ref 150–400)
RBC: 5.02 MIL/uL (ref 4.22–5.81)
RDW: 16.3 % — ABNORMAL HIGH (ref 11.5–15.5)
WBC: 11.9 K/uL — ABNORMAL HIGH (ref 4.0–10.5)
nRBC: 0 % (ref 0.0–0.2)

## 2024-03-01 LAB — DIGOXIN LEVEL: Digoxin Level: 1.2 ng/mL (ref 0.8–2.0)

## 2024-03-01 MED ORDER — LACTULOSE 10 GM/15ML PO SOLN
20.0000 g | Freq: Three times a day (TID) | ORAL | Status: DC
  Administered 2024-03-01: 20 g via ORAL
  Filled 2024-03-01 (×4): qty 30

## 2024-03-01 MED ORDER — LACTULOSE 10 GM/15ML PO SOLN: 30.0000 g | Freq: Three times a day (TID) | ORAL | Status: DC

## 2024-03-01 MED ORDER — BISACODYL 10 MG RE SUPP
10.0000 mg | Freq: Once | RECTAL | Status: DC
  Filled 2024-03-01: qty 1

## 2024-03-01 MED ORDER — IOHEXOL 350 MG/ML SOLN
75.0000 mL | Freq: Once | INTRAVENOUS | Status: AC | PRN
  Administered 2024-03-01: 75 mL via INTRAVENOUS

## 2024-03-01 MED ORDER — SODIUM CHLORIDE 0.9 % IV SOLN
12.5000 mg | Freq: Once | INTRAVENOUS | Status: AC
  Administered 2024-03-01: 12.5 mg via INTRAVENOUS
  Filled 2024-03-01: qty 12.5

## 2024-03-01 MED ORDER — SPIRONOLACTONE 12.5 MG HALF TABLET: 12.5000 mg | ORAL_TABLET | Freq: Every day | ORAL | Status: DC

## 2024-03-01 MED ORDER — FLEET ENEMA RE ENEM
1.0000 | ENEMA | Freq: Once | RECTAL | Status: AC
  Administered 2024-03-02: 1 via RECTAL
  Filled 2024-03-01: qty 1

## 2024-03-01 NOTE — Progress Notes (Signed)
 " PROGRESS NOTE    [name]  FMW:[id number] DOB: 1985/12/05 DOA: 02/23/2024 PCP: Orpha Yancey LABOR, MD   38/M w HFrEF (EF 30-35%) status post AICD, CAD s/p PCI followed by CABG (LIMA to LAD patent, remaining grafts occluded), paroxysmal atrial fibrillation status post DCCV 2022, intermittent rectal bleeding, LV thrombus, hyperlipidemia, history of tobacco use in remission, and frequent PVCs presenting with worsening dyspnea approximately 3 to 4 days  -echo shows depressed EF at 20 to 25% with global hypokinesia and low normal RVEF  -complicated by constipation/hemorrhoids etc   Subjective: - Overnight with abdominal distention, nausea and an episode of vomiting, KUB was concerning for?  SBO and moderate stool burden, CT was relatively unremarkable  Assessment and Plan:  Acute on chronic systolic CHF - Known ischemic cardiomyopathy - Echo 12/24 noted EF of 30-35% mildly reduced RV - CHF team following, echo now noted EF of 20-25% with low normal RV - Diuresed with IV Lasix  this admission, CHF team following, GDMT limited by recurrent hyperkalemia - Holding Lasix  now, continue Coreg , Farxiga  - Remains on low-dose Entresto  with daily Lokelma   Constipation, abdominal distention nausea and vomiting - Moderate stool burden on x-ray, CTA overnight was unremarkable - No BM in 5 days, was on narcotics initially - DC morphine , add lactulose  3 times daily, Dulcolax suppository - Trial of enema today if no BM by this evening  PVCs On mexiletine, continue Coreg  - Started on Ranexa  this admission  CAD - Prior PCI to LAD in 2012, CABG 4/22 - Cath 10/22 noted patent LIMA to LAD, all other grafts occluded, status post PCI to native left circumflex 99% stenosis - Repeat cath 12/24 grafts all occluded except LIMA to LAD - Now on Eliquis , statin, Coreg   History of LV thrombus - Not noted on recent echoes, initially seen on cardiac MRI 5/22 Continue Eliquis   Recurrent hyperkalemia - Now  off Aldactone  - Remains on low-dose Lokelma  with Entresto   Atrial flutter/fibrillation - Now in sinus rhythm, continue Eliquis , Coreg      DVT prophylaxis: Eliquis  Code Status: Full code Family Communication: None present Disposition Plan: Home likely 48 hours I will be yeah I am Dr. Fairy someone one of the medical doctors okay  Consultants:    Procedures:   Antimicrobials:    Objective: Vitals:   02/29/24 2216 02/29/24 2300 03/01/24 0320 03/01/24 0740  BP: 101/62 94/70 95/65  94/71  Pulse:  77 74 76  Resp:  19  17  Temp:  98.7 F (37.1 C) 97.6 F (36.4 C) 97.8 F (36.6 C)  TempSrc:  Oral Oral Oral  SpO2:  98% 97%   Weight:   100.1 kg   Height:   6' 1 (1.854 m)     Intake/Output Summary (Last 24 hours) at 03/01/2024 0800 Last data filed at 02/29/2024 2300 Gross per 24 hour  Intake 363 ml  Output 800 ml  Net -437 ml   Filed Weights   02/28/24 0500 02/29/24 0300 03/01/24 0320  Weight: 101.7 kg 101.5 kg 100.1 kg    Examination:  General exam: Appears calm and comfortable frail, chronically ill-appearing Respiratory system: Clear to auscultation Cardiovascular system: S1 & S2 heard, RRR.  Abd: Soft, distended, bowel sounds decreased Central nervous system: Alert and oriented. No focal neurological deficits. Extremities: no edema Skin: No rashes Psychiatry: Flat affect   Data Reviewed:   CBC: Recent Labs  Lab 02/23/24 1323 02/26/24 0328 02/29/24 0325 03/01/24 0317  WBC 9.5 10.0 7.7 11.9*  HGB 14.5 14.6  13.5 15.3  HCT 44.2 44.9 41.8 47.8  MCV 93.8 92.6 94.8 95.2  PLT 277 238 237 294   Basic Metabolic Panel: Recent Labs  Lab 02/24/24 0258 02/25/24 0331 02/26/24 1626 02/27/24 0259 02/28/24 0256 02/29/24 0325 03/01/24 0317  NA 137   < > 134* 132* 136 139 137  K 4.4   < > 5.3* 4.1 4.4 5.2* 5.5*  CL 102   < > 100 98 101 104 99  CO2 25   < > 23 24 26 25 26   GLUCOSE 99   < > 98 104* 110* 104* 97  BUN 26*   < > 23* 20 23* 24* 27*   CREATININE 1.58*   < > 1.15 1.09 1.16 1.22 1.27*  CALCIUM  8.8*   < > 9.1 8.6* 8.5* 8.9 9.4  MG 2.4  --   --   --   --   --   --    < > = values in this interval not displayed.   GFR: Estimated Creatinine Clearance: 96.2 mL/min (A) (by C-G formula based on SCr of 1.27 mg/dL (H)). Liver Function Tests: Recent Labs  Lab 02/23/24 1323  AST 28  ALT 30  ALKPHOS 88  BILITOT 0.7  PROT 6.8  ALBUMIN  4.1   No results for input(s): LIPASE, AMYLASE in the last 168 hours. No results for input(s): AMMONIA in the last 168 hours. Coagulation Profile: No results for input(s): INR, PROTIME in the last 168 hours. Cardiac Enzymes: No results for input(s): CKTOTAL, CKMB, CKMBINDEX, TROPONINI in the last 168 hours. BNP (last 3 results) Recent Labs    02/23/24 1323  PROBNP 668.0*   HbA1C: No results for input(s): HGBA1C in the last 72 hours. CBG: Recent Labs  Lab 02/23/24 1333 02/26/24 0503 02/26/24 0554  GLUCAP 94 105* 155*   Lipid Profile: No results for input(s): CHOL, HDL, LDLCALC, TRIG, CHOLHDL, LDLDIRECT in the last 72 hours. Thyroid  Function Tests: No results for input(s): TSH, T4TOTAL, FREET4, T3FREE, THYROIDAB in the last 72 hours. Anemia Panel: No results for input(s): VITAMINB12, FOLATE, FERRITIN, TIBC, IRON, RETICCTPCT in the last 72 hours. Urine analysis:    Component Value Date/Time   COLORURINE YELLOW 02/18/2023 0105   APPEARANCEUR CLEAR 02/18/2023 0105   LABSPEC 1.024 02/18/2023 0105   PHURINE 5.0 02/18/2023 0105   GLUCOSEU >=500 (A) 02/18/2023 0105   HGBUR NEGATIVE 02/18/2023 0105   BILIRUBINUR NEGATIVE 02/18/2023 0105   KETONESUR NEGATIVE 02/18/2023 0105   PROTEINUR NEGATIVE 02/18/2023 0105   NITRITE NEGATIVE 02/18/2023 0105   LEUKOCYTESUR NEGATIVE 02/18/2023 0105   Sepsis Labs: @LABRCNTIP (procalcitonin:4,lacticidven:4)  )No results found for this or any previous visit (from the past 240 hours).    Radiology Studies: CT ABDOMEN PELVIS W CONTRAST Result Date: 03/01/2024 EXAM: CT ABDOMEN AND PELVIS WITH CONTRAST 03/01/2024 02:40:38 AM TECHNIQUE: CT of the abdomen and pelvis was performed with the administration of intravenous contrast. 75mL iohexol  (OMNIPAQUE ) 350 MG/ML injection was administered. Multiplanar reformatted images are provided for review. Automated exposure control, iterative reconstruction, and/or weight-based adjustment of the mA/kV was utilized to reduce the radiation dose to as low as reasonably achievable. COMPARISON: 01/07/2024. Calcific aortic atherosclerosis. CLINICAL HISTORY: Bowel obstruction suspected. FINDINGS: LOWER CHEST: No acute abnormality. LIVER: The liver is unremarkable. GALLBLADDER AND BILE DUCTS: Gallbladder is unremarkable. No biliary ductal dilatation. SPLEEN: No acute abnormality. PANCREAS: No acute abnormality. ADRENAL GLANDS: No acute abnormality. KIDNEYS, URETERS AND BLADDER: No stones in the kidneys or ureters. No hydronephrosis. No perinephric or periureteral stranding.  Urinary bladder is unremarkable. GI AND BOWEL: Stomach demonstrates no acute abnormality. There is no bowel obstruction. PERITONEUM AND RETROPERITONEUM: No ascites. No free air. VASCULATURE: Aorta is normal in caliber. LYMPH NODES: No lymphadenopathy. REPRODUCTIVE ORGANS: No acute abnormality. BONES AND SOFT TISSUES: No acute osseous abnormality. No focal soft tissue abnormality. IMPRESSION: 1. No CT evidence of bowel obstruction. Electronically signed by: Franky Stanford MD 03/01/2024 03:29 AM EST RP Workstation: [id number]   DG Abd Portable 1V Result Date: 02/29/2024 EXAM: 1 VIEW XRAY OF THE ABDOMEN 02/29/2024 09:33:00 PM COMPARISON: None available. CLINICAL HISTORY: Abdominal pain with vomiting FINDINGS: LINES, TUBES AND DEVICES: Multiple cardiac leads overlie the lower chest. BOWEL: Multiple mildly-to-dilated small bowel loops in the abdomen up to 3.6 cm in diameter, compatible with small  bowel obstruction. Mild-to-moderate stool and gas throughout the colon. SOFT TISSUES: No abnormal calcifications. BONES: No acute fracture. IMPRESSION: 1. Small bowel obstruction. 2. Mild-to-moderate stool and gas throughout the colon. Electronically signed by: Greig Pique MD 02/29/2024 11:40 PM EST RP Workstation: [id number]     Scheduled Meds:  apixaban   5 mg Oral BID   atorvastatin   80 mg Oral Daily   bisacodyl   10 mg Rectal Once   carvedilol   3.125 mg Oral BID WC   dapagliflozin  propanediol  10 mg Oral Daily   lactulose   20 g Oral TID   mexiletine  200 mg Oral BID   pantoprazole   40 mg Oral BID AC   ranolazine   500 mg Oral BID   sacubitril -valsartan   1 tablet Oral BID   senna-docusate  1 tablet Oral BID   sodium chloride  flush  3 mL Intravenous Q12H   sodium zirconium cyclosilicate   10 g Oral Daily   Continuous Infusions:   LOS: 7 days    Time spent:    Sigurd Pac, MD [facility]   03/01/2024, 8:00 AM    "

## 2024-03-01 NOTE — Progress Notes (Addendum)
 Patient refused to take the order suppository and enema that was ordered to help his bowel move. Per patient, he wants to wait and see if his bowel will move with the one lactulose  and sennokot he took this morning. Patient educated on the importance of taking the above meds but he still refused. MD Fairy made aware of the above.   Patient also refused to take Lokelmel, says he afraid it will make him vomit.

## 2024-03-01 NOTE — Progress Notes (Signed)
 Patient ID: Wyatt Donovan, male   DOB: 24-Mar-1985, 38 y.o.   MRN: [id number]     Advanced Heart Failure Rounding Note  Cardiologist: Ezra Shuck, MD  Chief Complaint: Chest pain     Developed abdominal pain and distention yesterday.  This was associated with nausea/vomiting.  Says he has not had a BM in 5 days. KUB suggested small bowel obstruction but CT abdomen/pelvis was unremarkable.  This morning, still with distended abdomen.   K elevated at 5.5, digoxin  1.2.  SBP 90s.   Objective:   Weight Range: 100.1 kg Body mass index is 29.12 kg/m.   Vital Signs:   Temp:  [97.6 F (36.4 C)-98.7 F (37.1 C)] 97.6 F (36.4 C) (12/31 0320) Pulse Rate:  [69-77] 74 (12/31 0320) Resp:  [17-19] 19 (12/30 2300) BP: (73-105)/(52-77) 95/65 (12/31 0320) SpO2:  [95 %-98 %] 97 % (12/31 0320) Weight:  [100.1 kg] 100.1 kg (12/31 0320) Last BM Date : 02/29/24 (hard, small balls)  Weight change: Filed Weights   02/28/24 0500 02/29/24 0300 03/01/24 0320  Weight: 101.7 kg 101.5 kg 100.1 kg    Intake/Output:   Intake/Output Summary (Last 24 hours) at 03/01/2024 0736 Last data filed at 02/29/2024 2300 Gross per 24 hour  Intake 363 ml  Output 800 ml  Net -437 ml     Physical Exam   General: NAD Neck: No JVD, no thyromegaly or thyroid  nodule.  Lungs: Clear to auscultation bilaterally with normal respiratory effort. CV: Nondisplaced PMI.  Heart regular S1/S2, no S3/S4, no murmur.  No peripheral edema.  Abdomen: Soft, mild diffuse tenderness, no hepatosplenomegaly, moderate distention.  Skin: Intact without lesions or rashes.  Neurologic: Alert and oriented x 3.  Psych: Normal affect. Extremities: No clubbing or cyanosis.  HEENT: Normal.   Telemetry   NSR 70s (personally reviewed)  Labs   CBC Recent Labs    02/29/24 0325 03/01/24 0317  WBC 7.7 11.9*  HGB 13.5 15.3  HCT 41.8 47.8  MCV 94.8 95.2  PLT 237 294   Basic Metabolic Panel Recent Labs    87/69/74 0325  03/01/24 0317  NA 139 137  K 5.2* 5.5*  CL 104 99  CO2 25 26  GLUCOSE 104* 97  BUN 24* 27*  CREATININE 1.22 1.27*  CALCIUM  8.9 9.4   Liver Function Tests No results for input(s): AST, ALT, ALKPHOS, BILITOT, PROT, ALBUMIN  in the last 72 hours. No results for input(s): LIPASE, AMYLASE in the last 72 hours. Cardiac Enzymes No results for input(s): CKTOTAL, CKMB, CKMBINDEX, TROPONINI in the last 72 hours.  BNP: BNP (last 3 results) Recent Labs    04/27/23 0945 06/08/23 1040 01/31/24 0943  BNP 140.7* 135.4* 392.9*    ProBNP (last 3 results) Recent Labs    02/23/24 1323  PROBNP 668.0*     D-Dimer No results for input(s): DDIMER in the last 72 hours. Hemoglobin A1C No results for input(s): HGBA1C in the last 72 hours. Fasting Lipid Panel No results for input(s): CHOL, HDL, LDLCALC, TRIG, CHOLHDL, LDLDIRECT in the last 72 hours. Medications:   Scheduled Medications:  apixaban   5 mg Oral BID   atorvastatin   80 mg Oral Daily   carvedilol   3.125 mg Oral BID WC   dapagliflozin  propanediol  10 mg Oral Daily   furosemide   20 mg Oral Daily   mexiletine  200 mg Oral BID   pantoprazole   40 mg Oral BID AC   polyethylene glycol  17 g Oral BID   ranolazine   500 mg Oral BID   sacubitril -valsartan   1 tablet Oral BID   senna-docusate  1 tablet Oral BID   sodium chloride  flush  3 mL Intravenous Q12H   sodium zirconium cyclosilicate   10 g Oral Daily    Infusions:   PRN Medications: acetaminophen , albuterol , morphine  injection, nitroGLYCERIN , ondansetron  (ZOFRAN ) IV, sodium chloride  flush  Assessment/Plan   1. 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  - Ranolazine  was added, should help.  - Continue Coreg  3.125 bid.  2. CAD: Early onset CAD s/p MI with LAD PCI in 2012,followed by CABG  x 4 4/22  Admit 10/22 with NSTEMI, cath showed patent LIMA to LAD, all other  grafts occluded. S/p PCI DES native mid LCx 99% stenosis. Repeat 01/2023 LHC showed coronary anatomy unchanged; grafts all occluded (known prior) except LIMA-LAD.  Occluded native RCA (known from prior) with patent mid LCx stent.  He had constant chest pain for > 1 week with minimally elevated troponin/no trend.  No ACS suspected.  Pain has now completely resolved.  - He is now off Plavix  (stopped 8/23 due to BRBPR). - He is on Eliquis  so no ASA.   - Continue atorvastatin  80 mg daily.  - Continue ranolazine .  - Off Imdur  with Coreg .  3. Acute on chronic systolic CHF: Ischemic cardiomyopathy.  [employer] ICD.  Cardiac MRI in 10/22 showed LVEF 29% and RVEF 45%.  Patient has a cardiac contractility modulator.  Echo 12/24 showed EF 30-35%, global hypokinesis, mild RV dysfunction, IVC not dilated.  GDMT has been limited by hypotension & hyperkalemia, K is now controlled with Lokelma .  Had RHC 12/24 showing normal filling pressures, low cardiac output with CI 2 by Fick, 1.82 by thermodilution. RHC in 12/25 with normal filling pressures but CI 2.02. Echo this admission with EF 20-25%, RV low normal function.  He was initially diuresed at admission, now back on po Lasix .  Looks euvolemic now.  GDMT was stopped due to hyperkalemia, but had been off his home Lokelma  for uncertain reasons when he developed hyperkalemia. Now restarted yesterday.  However, poor po intake with abdominal pain/distention and K 5.5 with BUN rising.  - Stop Lasix  for now until back to eating/drinking normally.  - Digoxin  level 1.2, poor po intake.  Will hold digoxin  for now, probably restart at 0.0625 mg every other day.   - Continue Coreg  3.125 mg bid.  - Continue Farxiga  10 mg daily.  - Stop spironolactone  with elevated K.  - Continue Entresto  24/26 bid along with Lokelma  10 g daily.  - Nearing end stage ischemic cardiomyopathy.  [facility].  He would be a LVAD candidate if needed.  4.  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  No thrombus on echo 08/22. No thrombus on cMRI 12/06/20.  No thrombus on most recent echo.   - Continue Eliquis  5 mg bid.  5. Hyperkalemia: He has had a tendency towards hyperkalemia and has been on Lokelma  in order to stay on spironolactone  and Entresto .  K still high despite Lokelma  10 g daily.  Will stop spironolactone  but continue Entresto  for now.  6. Atrial flutter/fibrillation: Both arrhythmias noted in 10/22.  He tolerated poorly with worsened symptoms.  He had DCCV in 10/22. Previously on amiodarone  but later stopped. He is in NSR today.  - Continue Eliquis .  - Would favor ablation if he has recurrent AF.  7. GI:  He had developed abdominal pain and distention with poor po intake as well as nausea/vomiting.  No BM x 5 days.  KUB suggestive of SBO but CT abdomen benign. This is now the main issue keeping him in hospital.  - Bowel regimen - ?GI consult, per primary team.   Ezra Shuck 03/01/2024 7:36 AM

## 2024-03-01 NOTE — Progress Notes (Signed)
 Patient was seen for abdominal pain, distension, and vomiting.   He reports a few days of abdominal discomfort that has worsened. He reports vomiting a few days ago and began vomiting again today. He reports increased abdominal distension today. He had a small hard BM yesterday and continues to pass a little flatus. He denies hx of abdominal surgery or SBO.   He is alert, not in distress, and speaking in full sentences. Abdomen is distended and generally tender but soft. Bowel sounds are hypoactive.   KUB raised concern for SBO but CT is negative for obstruction or other acute findings.   Plan bowel rest for now and continued symptomatic treatments.

## 2024-03-01 NOTE — Plan of Care (Signed)
" °  Problem: Education: Goal: Knowledge of General Education information will improve Description: Including pain rating scale, medication(s)/side effects and non-pharmacologic comfort measures Outcome: Progressing   Problem: Activity: Goal: Risk for activity intolerance will decrease Outcome: Progressing   Problem: Coping: Goal: Level of anxiety will decrease Outcome: Progressing   Problem: Safety: Goal: Ability to remain free from injury will improve Outcome: Progressing   Problem: Elimination: Goal: Will not experience complications related to bowel motility Outcome: Not Progressing   "

## 2024-03-02 DIAGNOSIS — I5023 Acute on chronic systolic (congestive) heart failure: Secondary | ICD-10-CM | POA: Diagnosis not present

## 2024-03-02 LAB — BASIC METABOLIC PANEL WITH GFR
Anion gap: 15 (ref 5–15)
BUN: 45 mg/dL — ABNORMAL HIGH (ref 6–20)
CO2: 26 mmol/L (ref 22–32)
Calcium: 9.7 mg/dL (ref 8.9–10.3)
Chloride: 97 mmol/L — ABNORMAL LOW (ref 98–111)
Creatinine, Ser: 1.77 mg/dL — ABNORMAL HIGH (ref 0.61–1.24)
GFR, Estimated: 49 mL/min — ABNORMAL LOW
Glucose, Bld: 96 mg/dL (ref 70–99)
Potassium: 4.8 mmol/L (ref 3.5–5.1)
Sodium: 138 mmol/L (ref 135–145)

## 2024-03-02 LAB — CBC
HCT: 49.2 % (ref 39.0–52.0)
Hemoglobin: 15.5 g/dL (ref 13.0–17.0)
MCH: 29.7 pg (ref 26.0–34.0)
MCHC: 31.5 g/dL (ref 30.0–36.0)
MCV: 94.3 fL (ref 80.0–100.0)
Platelets: 365 K/uL (ref 150–400)
RBC: 5.22 MIL/uL (ref 4.22–5.81)
RDW: 16 % — ABNORMAL HIGH (ref 11.5–15.5)
WBC: 9.8 K/uL (ref 4.0–10.5)
nRBC: 0 % (ref 0.0–0.2)

## 2024-03-02 LAB — MAGNESIUM: Magnesium: 3 mg/dL — ABNORMAL HIGH (ref 1.7–2.4)

## 2024-03-02 NOTE — Progress Notes (Signed)
 " PROGRESS NOTE    Wyatt Donovan  FMW:[id number] DOB: 21-Aug-1985 DOA: 02/23/2024 PCP: Orpha Yancey LABOR, MD   38/M w HFrEF (EF 30-35%) status post AICD, CAD s/p PCI followed by CABG (LIMA to LAD patent, remaining grafts occluded), paroxysmal atrial fibrillation status post DCCV 2022, intermittent rectal bleeding, LV thrombus, hyperlipidemia, history of tobacco use in remission, and frequent PVCs presenting with worsening dyspnea approximately 3 to 4 days  -echo shows depressed EF at 20 to 25% with global hypokinesia and low normal RVEF  -complicated by constipation/hemorrhoids etc   Subjective: - Refused Dulcolax suppository and enema yesterday, still distended, nauseated, agrees to take them today  Assessment and Plan:  Acute on chronic systolic CHF - Known ischemic cardiomyopathy - Echo 12/24 noted EF of 30-35% mildly reduced RV - CHF team following, echo now noted EF of 20-25% with low normal RV - Diuresed with IV Lasix  this admission, CHF team following, GDMT limited by recurrent hyperkalemia - Holding Lasix  now, continue Coreg , Farxiga  - Entresto  discontinued today  Constipation, abdominal distention nausea and vomiting - Moderate stool burden on x-ray, CTA overnight was unremarkable - No BM in 5 days, was on narcotics initially - Stopped morphine  yesterday, continue  lactulose  3 times daily, Dulcolax suppository - Refused above meds yesterday, agrees to take enema today  PVCs On mexiletine, continue Coreg  - Started on Ranexa  this admission  CAD - Prior PCI to LAD in 2012, CABG 4/22 - Cath 10/22 noted patent LIMA to LAD, all other grafts occluded, status post PCI to native left circumflex 99% stenosis - Repeat cath 12/24 grafts all occluded except LIMA to LAD - Now on Eliquis , statin, Coreg   History of LV thrombus - Not noted on recent echoes, initially seen on cardiac MRI 5/22 Continue Eliquis   Recurrent hyperkalemia - Now off Aldactone  - entresto  stopped  now  Atrial flutter/fibrillation - Now in sinus rhythm, continue Eliquis , Coreg      DVT prophylaxis: Eliquis  Code Status: Full code Family Communication: None present Disposition Plan: home iwhen improved  Consultants:    Procedures:   Antimicrobials:    Objective: Vitals:   03/02/24 0004 03/02/24 0405 03/02/24 0722 03/02/24 0724  BP: (!) 87/58 100/60 101/63 101/63  Pulse: 79 71 86   Resp: 18 18 20 20   Temp: 97.9 F (36.6 C) 98.6 F (37 C) 97.8 F (36.6 C)   TempSrc: Oral Oral Oral   SpO2:  98% 96% 94%  Weight:  98.8 kg    Height:        Intake/Output Summary (Last 24 hours) at 03/02/2024 0754 Last data filed at 03/01/2024 2315 Gross per 24 hour  Intake 620 ml  Output 500 ml  Net 120 ml   Filed Weights   02/29/24 0300 03/01/24 0320 03/02/24 0405  Weight: 101.5 kg 100.1 kg 98.8 kg    Examination:  General exam: Appears calm and comfortable frail, chronically ill-appearing, uncomfortable Respiratory system: Clear Cardiovascular system: S1 & S2 heard, RRR.  Abd: Distended, soft, bowel sounds decreased Central nervous system: Alert and oriented. No focal neurological deficits. Extremities: no edema Skin: No rashes Psychiatry: Flat affect   Data Reviewed:   CBC: Recent Labs  Lab 02/26/24 0328 02/29/24 0325 03/01/24 0317 03/02/24 0349  WBC 10.0 7.7 11.9* 9.8  HGB 14.6 13.5 15.3 15.5  HCT 44.9 41.8 47.8 49.2  MCV 92.6 94.8 95.2 94.3  PLT 238 237 294 365   Basic Metabolic Panel: Recent Labs  Lab 02/27/24 0259 02/28/24 0256 02/29/24 0325  03/01/24 0317 03/02/24 0349  NA 132* 136 139 137 138  K 4.1 4.4 5.2* 5.5* 4.8  CL 98 101 104 99 97*  CO2 24 26 25 26 26   GLUCOSE 104* 110* 104* 97 96  BUN 20 23* 24* 27* 45*  CREATININE 1.09 1.16 1.22 1.27* 1.77*  CALCIUM  8.6* 8.5* 8.9 9.4 9.7  MG  --   --   --   --  3.0*   GFR: Estimated Creatinine Clearance: 68.7 mL/min (A) (by C-G formula based on SCr of 1.77 mg/dL (H)). Liver Function Tests: No  results for input(s): AST, ALT, ALKPHOS, BILITOT, PROT, ALBUMIN  in the last 168 hours.  No results for input(s): LIPASE, AMYLASE in the last 168 hours. No results for input(s): AMMONIA in the last 168 hours. Coagulation Profile: No results for input(s): INR, PROTIME in the last 168 hours. Cardiac Enzymes: No results for input(s): CKTOTAL, CKMB, CKMBINDEX, TROPONINI in the last 168 hours. BNP (last 3 results) Recent Labs    02/23/24 1323  PROBNP 668.0*   HbA1C: No results for input(s): HGBA1C in the last 72 hours. CBG: Recent Labs  Lab 02/26/24 0503 02/26/24 0554  GLUCAP 105* 155*   Lipid Profile: No results for input(s): CHOL, HDL, LDLCALC, TRIG, CHOLHDL, LDLDIRECT in the last 72 hours. Thyroid  Function Tests: No results for input(s): TSH, T4TOTAL, FREET4, T3FREE, THYROIDAB in the last 72 hours. Anemia Panel: No results for input(s): VITAMINB12, FOLATE, FERRITIN, TIBC, IRON, RETICCTPCT in the last 72 hours. Urine analysis:    Component Value Date/Time   COLORURINE YELLOW 02/18/2023 0105   APPEARANCEUR CLEAR 02/18/2023 0105   LABSPEC 1.024 02/18/2023 0105   PHURINE 5.0 02/18/2023 0105   GLUCOSEU >=500 (A) 02/18/2023 0105   HGBUR NEGATIVE 02/18/2023 0105   BILIRUBINUR NEGATIVE 02/18/2023 0105   KETONESUR NEGATIVE 02/18/2023 0105   PROTEINUR NEGATIVE 02/18/2023 0105   NITRITE NEGATIVE 02/18/2023 0105   LEUKOCYTESUR NEGATIVE 02/18/2023 0105   Sepsis Labs: @LABRCNTIP (procalcitonin:4,lacticidven:4)  )No results found for this or any previous visit (from the past 240 hours).   Radiology Studies: CT ABDOMEN PELVIS W CONTRAST Result Date: 03/01/2024 EXAM: CT ABDOMEN AND PELVIS WITH CONTRAST 03/01/2024 02:40:38 AM TECHNIQUE: CT of the abdomen and pelvis was performed with the administration of intravenous contrast. 75mL iohexol  (OMNIPAQUE ) 350 MG/ML injection was administered. Multiplanar reformatted images  are provided for review. Automated exposure control, iterative reconstruction, and/or weight-based adjustment of the mA/kV was utilized to reduce the radiation dose to as low as reasonably achievable. COMPARISON: 01/07/2024. Calcific aortic atherosclerosis. CLINICAL HISTORY: Bowel obstruction suspected. FINDINGS: LOWER CHEST: No acute abnormality. LIVER: The liver is unremarkable. GALLBLADDER AND BILE DUCTS: Gallbladder is unremarkable. No biliary ductal dilatation. SPLEEN: No acute abnormality. PANCREAS: No acute abnormality. ADRENAL GLANDS: No acute abnormality. KIDNEYS, URETERS AND BLADDER: No stones in the kidneys or ureters. No hydronephrosis. No perinephric or periureteral stranding. Urinary bladder is unremarkable. GI AND BOWEL: Stomach demonstrates no acute abnormality. There is no bowel obstruction. PERITONEUM AND RETROPERITONEUM: No ascites. No free air. VASCULATURE: Aorta is normal in caliber. LYMPH NODES: No lymphadenopathy. REPRODUCTIVE ORGANS: No acute abnormality. BONES AND SOFT TISSUES: No acute osseous abnormality. No focal soft tissue abnormality. IMPRESSION: 1. No CT evidence of bowel obstruction. Electronically signed by: Franky Stanford MD 03/01/2024 03:29 AM EST RP Workstation: [id number]   DG Abd Portable 1V Result Date: 02/29/2024 EXAM: 1 VIEW XRAY OF THE ABDOMEN 02/29/2024 09:33:00 PM COMPARISON: None available. CLINICAL HISTORY: Abdominal pain with vomiting FINDINGS: LINES, TUBES AND DEVICES: Multiple cardiac  leads overlie the lower chest. BOWEL: Multiple mildly-to-dilated small bowel loops in the abdomen up to 3.6 cm in diameter, compatible with small bowel obstruction. Mild-to-moderate stool and gas throughout the colon. SOFT TISSUES: No abnormal calcifications. BONES: No acute fracture. IMPRESSION: 1. Small bowel obstruction. 2. Mild-to-moderate stool and gas throughout the colon. Electronically signed by: Greig Pique MD 02/29/2024 11:40 PM EST RP Workstation: [id number]      Scheduled Meds:  apixaban   5 mg Oral BID   atorvastatin   80 mg Oral Daily   bisacodyl   10 mg Rectal Once   carvedilol   3.125 mg Oral BID WC   dapagliflozin  propanediol  10 mg Oral Daily   lactulose   20 g Oral TID   mexiletine  200 mg Oral BID   pantoprazole   40 mg Oral BID AC   ranolazine   500 mg Oral BID   senna-docusate  1 tablet Oral BID   sodium chloride  flush  3 mL Intravenous Q12H   sodium phosphate  1 enema Rectal Once   Continuous Infusions:   LOS: 8 days    Time spent:    Sigurd Pac, MD [facility]   03/02/2024, 7:54 AM    "

## 2024-03-02 NOTE — Progress Notes (Signed)
 Fleet enema administered at this time with quick effect. Patient ambulatory to bathroom for bowel movement within moments of administration.

## 2024-03-02 NOTE — Progress Notes (Signed)
 Pts. Had epidsode of nausea and vomiting after night meds given. PRN Zofran  given. Pt. Refusing enema at this time. Stated he may decide to take it later.

## 2024-03-02 NOTE — Plan of Care (Signed)
 Alert and oriented.  Medicated for nausea.  Fleets enema given with bowel movement noted afterwards.  Educated patient on importance of getting out of bed to ambulate in halls. Declined to ambulate. Able to ambulate in room to bathroom independently.   Problem: Education: Goal: Knowledge of General Education information will improve Description: Including pain rating scale, medication(s)/side effects and non-pharmacologic comfort measures Outcome: Progressing   Problem: Health Behavior/Discharge Planning: Goal: Ability to manage health-related needs will improve Outcome: Progressing   Problem: Clinical Measurements: Goal: Ability to maintain clinical measurements within normal limits will improve Outcome: Progressing Goal: Will remain free from infection Outcome: Progressing Goal: Diagnostic test results will improve Outcome: Progressing Goal: Respiratory complications will improve Outcome: Progressing

## 2024-03-02 NOTE — Progress Notes (Signed)
 Patient ID: Wyatt Donovan, male   DOB: 1985/11/16, 39 y.o.   MRN: [id number]     Advanced Heart Failure Rounding Note  Cardiologist: Ezra Shuck, MD  Chief Complaint: Chest pain     Developed abdominal pain and distention.  This has been associated with nausea/vomiting.  No BM x days.  KUB suggested small bowel obstruction but CT abdomen/pelvis was unremarkable.  This morning, he still has a distended abdomen with no BM.  He refused enema or suppository yesterday.  Nausea/vomiting when he tries to take po.  Has kept some meds down but not all.   SBP 90s-100s, creatinine 1.27 => 1.77.    Objective:   Weight Range: 98.8 kg Body mass index is 28.74 kg/m.   Vital Signs:   Temp:  [97.7 F (36.5 C)-98.6 F (37 C)] 97.8 F (36.6 C) (01/01 0722) Pulse Rate:  [71-86] 86 (01/01 0722) Resp:  [17-20] 20 (01/01 0724) BP: (78-107)/(54-78) 101/63 (01/01 0724) SpO2:  [94 %-98 %] 94 % (01/01 0724) Weight:  [98.8 kg] 98.8 kg (01/01 0405) Last BM Date : 02/26/24  Weight change: Filed Weights   02/29/24 0300 03/01/24 0320 03/02/24 0405  Weight: 101.5 kg 100.1 kg 98.8 kg    Intake/Output:   Intake/Output Summary (Last 24 hours) at 03/02/2024 0744 Last data filed at 03/01/2024 2315 Gross per 24 hour  Intake 620 ml  Output 500 ml  Net 120 ml     Physical Exam   General: NAD Neck: No JVD, no thyromegaly or thyroid  nodule.  Lungs: Clear to auscultation bilaterally with normal respiratory effort. CV: Nondisplaced PMI.  Heart regular S1/S2, no S3/S4, no murmur.  No peripheral edema.  Abdomen: Soft, mild diffuse tenderness, no hepatosplenomegaly, moderate distention.  Skin: Intact without lesions or rashes.  Neurologic: Alert and oriented x 3.  Psych: Normal affect. Extremities: No clubbing or cyanosis.  HEENT: Normal.   Telemetry   NSR 70s with PVCs (personally reviewed)  Labs   CBC Recent Labs    03/01/24 0317 03/02/24 0349  WBC 11.9* 9.8  HGB 15.3 15.5  HCT 47.8 49.2   MCV 95.2 94.3  PLT 294 365   Basic Metabolic Panel Recent Labs    87/68/74 0317 03/02/24 0349  NA 137 138  K 5.5* 4.8  CL 99 97*  CO2 26 26  GLUCOSE 97 96  BUN 27* 45*  CREATININE 1.27* 1.77*  CALCIUM  9.4 9.7  MG  --  3.0*   Liver Function Tests No results for input(s): AST, ALT, ALKPHOS, BILITOT, PROT, ALBUMIN  in the last 72 hours. No results for input(s): LIPASE, AMYLASE in the last 72 hours. Cardiac Enzymes No results for input(s): CKTOTAL, CKMB, CKMBINDEX, TROPONINI in the last 72 hours.  BNP: BNP (last 3 results) Recent Labs    04/27/23 0945 06/08/23 1040 01/31/24 0943  BNP 140.7* 135.4* 392.9*    ProBNP (last 3 results) Recent Labs    02/23/24 1323  PROBNP 668.0*     D-Dimer No results for input(s): DDIMER in the last 72 hours. Hemoglobin A1C No results for input(s): HGBA1C in the last 72 hours. Fasting Lipid Panel No results for input(s): CHOL, HDL, LDLCALC, TRIG, CHOLHDL, LDLDIRECT in the last 72 hours. Medications:   Scheduled Medications:  apixaban   5 mg Oral BID   atorvastatin   80 mg Oral Daily   bisacodyl   10 mg Rectal Once   carvedilol   3.125 mg Oral BID WC   dapagliflozin  propanediol  10 mg Oral Daily  lactulose   20 g Oral TID   mexiletine  200 mg Oral BID   pantoprazole   40 mg Oral BID AC   ranolazine   500 mg Oral BID   sacubitril -valsartan   1 tablet Oral BID   senna-docusate  1 tablet Oral BID   sodium chloride  flush  3 mL Intravenous Q12H   sodium phosphate  1 enema Rectal Once   sodium zirconium cyclosilicate   10 g Oral Daily    Infusions:   PRN Medications: acetaminophen , albuterol , nitroGLYCERIN , ondansetron  (ZOFRAN ) IV, sodium chloride  flush  Assessment/Plan   1. 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  - Ranolazine  was added, should help.  - Continue Coreg  3.125 bid.  2. CAD: Early onset CAD s/p MI with LAD  PCI in 2012,followed by CABG  x 4 4/22  Admit 10/22 with NSTEMI, cath showed patent LIMA to LAD, all other grafts occluded. S/p PCI DES native mid LCx 99% stenosis. Repeat 01/2023 LHC showed coronary anatomy unchanged; grafts all occluded (known prior) except LIMA-LAD.  Occluded native RCA (known from prior) with patent mid LCx stent.  He had constant chest pain for > 1 week with minimally elevated troponin/no trend.  No ACS suspected.  Pain has now completely resolved.  - He is now off Plavix  (stopped 8/23 due to BRBPR). - He is on Eliquis  so no ASA.   - Continue atorvastatin  80 mg daily.  - Continue ranolazine .  - Off Imdur  with Coreg .  3. Acute on chronic systolic CHF: Ischemic cardiomyopathy.  [employer] ICD.  Cardiac MRI in 10/22 showed LVEF 29% and RVEF 45%.  Patient has a cardiac contractility modulator.  Echo 12/24 showed EF 30-35%, global hypokinesis, mild RV dysfunction, IVC not dilated.  GDMT has been limited by hypotension & hyperkalemia, K is now controlled with Lokelma .  Had RHC 12/24 showing normal filling pressures, low cardiac output with CI 2 by Fick, 1.82 by thermodilution. RHC in 12/25 with normal filling pressures but CI 2.02. Echo this admission with EF 20-25%, RV low normal function.  He was initially diuresed at admission, now back on po Lasix .  Looks euvolemic now.  GDMT was stopped due to hyperkalemia, but had been off his home Lokelma  for uncertain reasons when he developed hyperkalemia. Meds initially restarted; however, with rising creatinine and poor po intake/obstipation, we have had to cut back again.  Creatinine now up to 1.77 with poor po intake + nausea/vomiting.   - Hold Lasix  until eating/drinking normally.  - Digoxin  level 1.2, poor po intake.  Will hold digoxin  for now, probably restart at 0.0625 mg every other day.   - Continue Coreg  3.125 mg bid for now.  - Continue Farxiga  10 mg daily for now.  - He will stay off spironolactone  for now and will stop  Entresto  and Lokelma  with rising creatinine.   - Nearing end stage ischemic cardiomyopathy.  Has been referred to [facility] for transplant evaluation.  He would be a LVAD candidate if needed.  4. 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  No thrombus on echo 08/22. No thrombus on cMRI 12/06/20.  No thrombus on most recent echo.   - Continue Eliquis  5 mg bid.  5. Hyperkalemia: He has had a tendency towards hyperkalemia and has been on Lokelma  in order to stay on spironolactone  and Entresto .  We are holding Entresto , spironolactone , and Lokelma  for now as above.   6. Atrial flutter/fibrillation: Both arrhythmias noted  in 10/22.  He tolerated poorly with worsened symptoms.  He had DCCV in 10/22. Previously on amiodarone  but later stopped. He is in NSR today.  - Continue Eliquis .  - Would favor ablation if he has recurrent AF.  7. GI: He had developed abdominal pain and distention with poor po intake as well as nausea/vomiting.  No BM for days, obstipation.  KUB suggestive of SBO but CT abdomen benign. This is now the main issue keeping him in hospital. He refused enema or suppository yesterday, still no BM.  Still with distended abdomen and nausea/vomiting with po intake so taking in minimal amounts.   - Needs enema today.  Aggressive bowel regimen per primary team.  8. AKI: Creatinine up to 1.77 due to poor po intake.  - He is off Lasix  and spironolactone , will stop Entresto  and Lokelma .    Ezra Shuck 03/02/2024 7:44 AM

## 2024-03-02 NOTE — Progress Notes (Signed)
 Attempted to give AM scheduled medications.  Medicated with PRN zofran  for nausea see MAR for details.  Due to nausea and fear of vomiting, patient only able to tolerate: apixaban , dapagliflozin  propanediol, ranolazine    Refused this morning due to nausea: sennosides-docusate, atorvastatin , mexiletine, lactulose .

## 2024-03-02 NOTE — Progress Notes (Signed)
 Bowel movement noted after enema.    03/02/24 1235  Unmeasured Output  Urine Occurrence 1  Stool Occurrence 1  Stool Measurement/Characteristics  Bowel Incontinence No  Stool Type Type 2 (Lump and sausage like)  Stool Amount Large  Stool Source Rectum

## 2024-03-03 ENCOUNTER — Ambulatory Visit (HOSPITAL_COMMUNITY)

## 2024-03-03 DIAGNOSIS — I5023 Acute on chronic systolic (congestive) heart failure: Secondary | ICD-10-CM | POA: Diagnosis not present

## 2024-03-03 LAB — CBC
HCT: 46.5 % (ref 39.0–52.0)
Hemoglobin: 14.7 g/dL (ref 13.0–17.0)
MCH: 30 pg (ref 26.0–34.0)
MCHC: 31.6 g/dL (ref 30.0–36.0)
MCV: 94.9 fL (ref 80.0–100.0)
Platelets: 298 K/uL (ref 150–400)
RBC: 4.9 MIL/uL (ref 4.22–5.81)
RDW: 15.9 % — ABNORMAL HIGH (ref 11.5–15.5)
WBC: 5.7 K/uL (ref 4.0–10.5)
nRBC: 0 % (ref 0.0–0.2)

## 2024-03-03 LAB — BASIC METABOLIC PANEL WITH GFR
Anion gap: 11 (ref 5–15)
BUN: 54 mg/dL — ABNORMAL HIGH (ref 6–20)
CO2: 28 mmol/L (ref 22–32)
Calcium: 8.8 mg/dL — ABNORMAL LOW (ref 8.9–10.3)
Chloride: 99 mmol/L (ref 98–111)
Creatinine, Ser: 1.82 mg/dL — ABNORMAL HIGH (ref 0.61–1.24)
GFR, Estimated: 48 mL/min — ABNORMAL LOW
Glucose, Bld: 88 mg/dL (ref 70–99)
Potassium: 4.5 mmol/L (ref 3.5–5.1)
Sodium: 137 mmol/L (ref 135–145)

## 2024-03-03 MED ORDER — SORBITOL 70 % SOLN
30.0000 mL | Freq: Once | Status: AC
  Administered 2024-03-03: 30 mL via ORAL
  Filled 2024-03-03: qty 30

## 2024-03-03 MED ORDER — ALBUMIN HUMAN 25 % IV SOLN
25.0000 g | Freq: Once | INTRAVENOUS | Status: AC
  Administered 2024-03-03: 12.5 g via INTRAVENOUS
  Filled 2024-03-03: qty 100

## 2024-03-03 MED ORDER — LACTATED RINGERS IV BOLUS
500.0000 mL | Freq: Once | INTRAVENOUS | Status: AC
  Administered 2024-03-03: 500 mL via INTRAVENOUS

## 2024-03-03 NOTE — TOC Transition Note (Signed)
 Transition of Care [facility]) - Discharge Note   Patient Details  Name: Wyatt Donovan MRN: [id number] Date of Birth: 1985/08/26  [facility] CM/SW Contact:  Arlana JINNY Nicholaus [place] Phone Number: [phone] 03/03/2024, 11:45 AM   Clinical Narrative:  [facility] CSW called and schedule patients hospital PCP appointment for March 06, 2024 at 2:00 PM. Patient stated that family will provide transportation home.      Final next level of care: Home/Self Care Barriers to Discharge: No Barriers Identified   Patient Goals and CMS Choice Patient states their goals for this hospitalization and ongoing recovery are:: wants to recover          Discharge Placement                       Discharge Plan and Services Additional resources added to the After Visit Summary for     Discharge Planning Services: CM Consult                                 Social Drivers of Health (SDOH) Interventions SDOH Screenings   Food Insecurity: No Food Insecurity (02/23/2024)  Housing: Low Risk (02/23/2024)  Transportation Needs: No Transportation Needs (02/23/2024)  Utilities: Not At Risk (02/23/2024)  Depression (PHQ2-9): Medium Risk (04/14/2021)  Tobacco Use: Medium Risk (02/23/2024)     Readmission Risk Interventions    02/29/2024    1:16 PM  Readmission Risk Prevention Plan  Post Dischage Appt Complete  Medication Screening Complete  Transportation Screening Complete

## 2024-03-03 NOTE — Progress Notes (Signed)
 " PROGRESS NOTE    Wyatt Donovan  FMW:[id number] DOB: 11/04/1985 DOA: 02/23/2024 PCP: Orpha Yancey LABOR, MD   38/M w HFrEF (EF 30-35%) status post AICD, CAD s/p PCI followed by CABG (LIMA to LAD patent, remaining grafts occluded), paroxysmal atrial fibrillation status post DCCV 2022, intermittent rectal bleeding, LV thrombus, hyperlipidemia, history of tobacco use in remission, and frequent PVCs presenting with worsening dyspnea approximately 3 to 4 days  -echo shows depressed EF at 20 to 25% with global hypokinesia and low normal RVEF  -complicated by constipation/hemorrhoids etc - 12/31, refused enema and suppository -1/1, large BM after enema   Subjective: - Feels better overall, large BM yesterday, distention is improving  Assessment and Plan:  Acute on chronic systolic CHF - Known ischemic cardiomyopathy - Echo 12/24 noted EF of 30-35% mildly reduced RV - CHF team following, echo now noted EF of 20-25% with low normal RV - Diuresed with IV Lasix  this admission, CHF team following, GDMT limited by recurrent hyperkalemia - Then complicated by AKI in the setting of GI issues, diuretics and GDMT on hold - BMP in a.m.  Constipation, abdominal distention nausea and vomiting - Moderate stool burden on x-ray, CTA overnight was unremarkable - No BM in 6 days, was on narcotics initially - Off narcotics, encouraged laxatives again today, refused lactulose  again last night and this morning, agrees to take sorbitol today  PVCs On mexiletine, continue Coreg  - Started on Ranexa  this admission  CAD - Prior PCI to LAD in 2012, CABG 4/22 - Cath 10/22 noted patent LIMA to LAD, all other grafts occluded, status post PCI to native left circumflex 99% stenosis - Repeat cath 12/24 grafts all occluded except LIMA to LAD - Now on Eliquis , statin, Coreg   History of LV thrombus - Not noted on recent echoes, initially seen on cardiac MRI 5/22 Continue Eliquis   Recurrent hyperkalemia - Now off  Aldactone  - entresto  stopped now  Atrial flutter/fibrillation - Now in sinus rhythm, continue Eliquis , Coreg      DVT prophylaxis: Eliquis  Code Status: Full code Family Communication: None present Disposition Plan: home iwhen improved  Consultants:    Procedures:   Antimicrobials:    Objective: Vitals:   03/03/24 0558 03/03/24 0721 03/03/24 1000 03/03/24 1120  BP:  110/64 97/60 (!) 88/46  Pulse:  79  80  Resp:  17  18  Temp:  97.7 F (36.5 C)  97.6 F (36.4 C)  TempSrc:  Oral  Oral  SpO2:  96%  93%  Weight: 97.6 kg     Height:        Intake/Output Summary (Last 24 hours) at 03/03/2024 1201 Last data filed at 03/03/2024 1152 Gross per 24 hour  Intake 580 ml  Output 500 ml  Net 80 ml   Filed Weights   03/01/24 0320 03/02/24 0405 03/03/24 0558  Weight: 100.1 kg 98.8 kg 97.6 kg    Examination:  General exam: Chronically ill appears much older than stated age, AO x 3 Respiratory system: Clear Cardiovascular system: S1 & S2 heard, RRR.  Abd: Less distended, nontender, bowel sounds present Central nervous system: Alert and oriented. No focal neurological deficits. Extremities: no edema Skin: No rashes Psychiatry: Flat affect   Data Reviewed:   CBC: Recent Labs  Lab 02/26/24 0328 02/29/24 0325 03/01/24 0317 03/02/24 0349 03/03/24 0231  WBC 10.0 7.7 11.9* 9.8 5.7  HGB 14.6 13.5 15.3 15.5 14.7  HCT 44.9 41.8 47.8 49.2 46.5  MCV 92.6 94.8 95.2 94.3 94.9  PLT 238 237 294 365 298   Basic Metabolic Panel: Recent Labs  Lab 02/28/24 0256 02/29/24 0325 03/01/24 0317 03/02/24 0349 03/03/24 0231  NA 136 139 137 138 137  K 4.4 5.2* 5.5* 4.8 4.5  CL 101 104 99 97* 99  CO2 26 25 26 26 28   GLUCOSE 110* 104* 97 96 88  BUN 23* 24* 27* 45* 54*  CREATININE 1.16 1.22 1.27* 1.77* 1.82*  CALCIUM  8.5* 8.9 9.4 9.7 8.8*  MG  --   --   --  3.0*  --    GFR: Estimated Creatinine Clearance: 66.4 mL/min (A) (by C-G formula based on SCr of 1.82 mg/dL (H)). Liver  Function Tests: No results for input(s): AST, ALT, ALKPHOS, BILITOT, PROT, ALBUMIN  in the last 168 hours.  No results for input(s): LIPASE, AMYLASE in the last 168 hours. No results for input(s): AMMONIA in the last 168 hours. Coagulation Profile: No results for input(s): INR, PROTIME in the last 168 hours. Cardiac Enzymes: No results for input(s): CKTOTAL, CKMB, CKMBINDEX, TROPONINI in the last 168 hours. BNP (last 3 results) Recent Labs    02/23/24 1323  PROBNP 668.0*   HbA1C: No results for input(s): HGBA1C in the last 72 hours. CBG: Recent Labs  Lab 02/26/24 0503 02/26/24 0554  GLUCAP 105* 155*   Lipid Profile: No results for input(s): CHOL, HDL, LDLCALC, TRIG, CHOLHDL, LDLDIRECT in the last 72 hours. Thyroid  Function Tests: No results for input(s): TSH, T4TOTAL, FREET4, T3FREE, THYROIDAB in the last 72 hours. Anemia Panel: No results for input(s): VITAMINB12, FOLATE, FERRITIN, TIBC, IRON, RETICCTPCT in the last 72 hours. Urine analysis:    Component Value Date/Time   COLORURINE YELLOW 02/18/2023 0105   APPEARANCEUR CLEAR 02/18/2023 0105   LABSPEC 1.024 02/18/2023 0105   PHURINE 5.0 02/18/2023 0105   GLUCOSEU >=500 (A) 02/18/2023 0105   HGBUR NEGATIVE 02/18/2023 0105   BILIRUBINUR NEGATIVE 02/18/2023 0105   KETONESUR NEGATIVE 02/18/2023 0105   PROTEINUR NEGATIVE 02/18/2023 0105   NITRITE NEGATIVE 02/18/2023 0105   LEUKOCYTESUR NEGATIVE 02/18/2023 0105   Sepsis Labs: @LABRCNTIP (procalcitonin:4,lacticidven:4)  )No results found for this or any previous visit (from the past 240 hours).   Radiology Studies: No results found.    Scheduled Meds:  apixaban   5 mg Oral BID   atorvastatin   80 mg Oral Daily   bisacodyl   10 mg Rectal Once   carvedilol   3.125 mg Oral BID WC   dapagliflozin  propanediol  10 mg Oral Daily   mexiletine  200 mg Oral BID   pantoprazole   40 mg Oral BID AC   ranolazine    500 mg Oral BID   senna-docusate  1 tablet Oral BID   sodium chloride  flush  3 mL Intravenous Q12H   Continuous Infusions:   LOS: 9 days    Time spent:    Sigurd Pac, MD [facility]   03/03/2024, 12:01 PM    "

## 2024-03-03 NOTE — Progress Notes (Addendum)
 Patient ID: Wyatt Donovan, male   DOB: Oct 16, 1985, 39 y.o.   MRN: [id number]     Advanced Heart Failure Rounding Note  Cardiologist: Wyatt Shuck, MD  Chief Complaint: Chest pain     Developed abdominal pain and distention.  This has been associated with N/V.  KUB suggested small bowel obstruction but CT abdomen/pelvis was unremarkable. Has been refusing lactulose . Did get an enema yesterday with reported good response.   Not drinking much, encouraged to increase PO intake.   SBP 90s-100s, creatinine 1.27 => 1.77>1.82.    Feels better this morning. Belly less distended and passing has. Denies CP/SOB. Has been ambulating in the room.   Objective:   Weight Range: 97.6 kg Body mass index is 28.38 kg/m.   Vital Signs:   Temp:  [97.5 F (36.4 C)-97.8 F (36.6 C)] 97.7 F (36.5 C) (01/02 0721) Pulse Rate:  [69-79] 79 (01/02 0721) Resp:  [17-18] 17 (01/02 0721) BP: (79-110)/(55-74) 110/64 (01/02 0721) SpO2:  [94 %-98 %] 96 % (01/02 0721) Weight:  [97.6 kg] 97.6 kg (01/02 0558) Last BM Date : 03/02/24  Weight change: Filed Weights   03/01/24 0320 03/02/24 0405 03/03/24 0558  Weight: 100.1 kg 98.8 kg 97.6 kg    Intake/Output:   Intake/Output Summary (Last 24 hours) at 03/03/2024 0731 Last data filed at 03/02/2024 2132 Gross per 24 hour  Intake 483 ml  Output --  Net 483 ml     Physical Exam  General:  well appearing.  No respiratory difficulty Neck: JVD flat.  Cor: Regular rate & rhythm. No murmurs. Lungs: clear, diminished bases Extremities: no edema  Abd: hypoactive bowel sounds, distended, not tender to touch Neuro: alert & oriented x 3. Affect flat.   Telemetry   NSR 70s with PVCs (personally reviewed)  Labs   CBC Recent Labs    03/02/24 0349 03/03/24 0231  WBC 9.8 5.7  HGB 15.5 14.7  HCT 49.2 46.5  MCV 94.3 94.9  PLT 365 298   Basic Metabolic Panel Recent Labs    98/98/73 0349 03/03/24 0231  NA 138 137  K 4.8 4.5  CL 97* 99  CO2 26 28   GLUCOSE 96 88  BUN 45* 54*  CREATININE 1.77* 1.82*  CALCIUM  9.7 8.8*  MG 3.0*  --    Liver Function Tests No results for input(s): AST, ALT, ALKPHOS, BILITOT, PROT, ALBUMIN  in the last 72 hours. No results for input(s): LIPASE, AMYLASE in the last 72 hours. Cardiac Enzymes No results for input(s): CKTOTAL, CKMB, CKMBINDEX, TROPONINI in the last 72 hours.  BNP: BNP (last 3 results) Recent Labs    04/27/23 0945 06/08/23 1040 01/31/24 0943  BNP 140.7* 135.4* 392.9*    ProBNP (last 3 results) Recent Labs    02/23/24 1323  PROBNP 668.0*     D-Dimer No results for input(s): DDIMER in the last 72 hours. Hemoglobin A1C No results for input(s): HGBA1C in the last 72 hours. Fasting Lipid Panel No results for input(s): CHOL, HDL, LDLCALC, TRIG, CHOLHDL, LDLDIRECT in the last 72 hours. Medications:   Scheduled Medications:  apixaban   5 mg Oral BID   atorvastatin   80 mg Oral Daily   bisacodyl   10 mg Rectal Once   carvedilol   3.125 mg Oral BID WC   dapagliflozin  propanediol  10 mg Oral Daily   lactulose   20 g Oral TID   mexiletine  200 mg Oral BID   pantoprazole   40 mg Oral BID AC   ranolazine   500 mg Oral BID   senna-docusate  1 tablet Oral BID   sodium chloride  flush  3 mL Intravenous Q12H    Infusions:   PRN Medications: acetaminophen , albuterol , nitroGLYCERIN , ondansetron  (ZOFRAN ) IV, sodium chloride  flush  Assessment/Plan  1. 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  - Ranolazine  was added, should help.  - Continue Coreg  3.125 bid.   2. CAD: Early onset CAD s/p MI with LAD PCI in 2012,followed by CABG  x 4 4/22  Admit 10/22 with NSTEMI, cath showed patent LIMA to LAD, all other grafts occluded. S/p PCI DES native mid LCx 99% stenosis. Repeat 01/2023 LHC showed coronary anatomy unchanged; grafts all occluded (known prior) except LIMA-LAD.  Occluded native  RCA (known from prior) with patent mid LCx stent.  He had constant chest pain for > 1 week with minimally elevated troponin/no trend.  No ACS suspected.  Pain has now completely resolved.  - He is now off Plavix  (stopped 8/23 due to BRBPR). - He is on Eliquis  so no ASA.   - Continue atorvastatin  80 mg daily.  - Continue ranolazine .  - Off Imdur  with Coreg .   3. Acute on chronic systolic CHF: Ischemic cardiomyopathy.  [employer] ICD.  Cardiac MRI in 10/22 showed LVEF 29% and RVEF 45%.  Patient has a cardiac contractility modulator.  Echo 12/24 showed EF 30-35%, global hypokinesis, mild RV dysfunction, IVC not dilated.  GDMT has been limited by hypotension & hyperkalemia, K is now controlled with Lokelma .  Had RHC 12/24 showing normal filling pressures, low cardiac output with CI 2 by Fick, 1.82 by thermodilution. RHC in 12/25 with normal filling pressures but CI 2.02. Echo this admission with EF 20-25%, RV low normal function.  He was initially diuresed at admission, now back on po Lasix .  - Looks euvolemic now.  GDMT was stopped due to hyperkalemia, but had been off his home Lokelma  for uncertain reasons when he developed hyperkalemia. Meds initially restarted; however, with rising creatinine and poor po intake/obstipation, we have had to cut back again.  Creatinine now up to 1.82 with poor po intake + nausea/vomiting.   - Hold Lasix  until eating/drinking normally.  - Digoxin  level 1.2, poor po intake.  Will hold digoxin  for now, probably restart at 0.0625 mg every other day once renal function improves.    - Continue Coreg  3.125 mg bid for now.  - Continue Farxiga  10 mg daily for now.  - He will stay off spironolactone  for now and now off Entresto  and Lokelma  as well with rising creatinine.   - Nearing end stage ischemic cardiomyopathy.  Has been [facility].  He would be a LVAD candidate if needed.   4. 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  No thrombus on echo  08/22. No thrombus on cMRI 12/06/20.  No thrombus on most recent echo.   - Continue Eliquis  5 mg bid.   5. Hyperkalemia: He has had a tendency towards hyperkalemia and has been on Lokelma  in order to stay on spironolactone  and Entresto .  We are holding Entresto , spironolactone , and Lokelma  for now as above.    6. Atrial flutter/fibrillation: Both arrhythmias noted in 10/22.  He tolerated poorly with worsened symptoms.  He had DCCV in 10/22. Previously on amiodarone  but later stopped. He is in NSR today.  - Continue Eliquis .  - Would favor ablation if he has recurrent AF.  7. GI: He had developed abdominal pain and distention with poor po intake as well as nausea/vomiting.  KUB suggestive of SBO but CT abdomen benign. This is now the main issue keeping him in hospital.  Still with distended abdomen (but reports improvement) and nausea/vomiting with po intake so taking in minimal amounts.   - Refusing lactulose , will switch to sorbitol today  8. AKI: Creatinine up to 1.82 due to poor po intake.  - He is off Lasix , spironolactone , Entresto  and Lokelma .    Wyatt Donovan 03/03/2024 7:31 AM  Patient seen with NP, I formulated the plan and agree with the above note.   Less abdominal pain and less distention.  Feels better overall.  No more nausea/vomiting. Creatinine 1.77 => 1.82.   No chest pain or dyspnea.   General: NAD Neck: No JVD, no thyromegaly or thyroid  nodule.  Lungs: Clear to auscultation bilaterally with normal respiratory effort. CV: Nondisplaced PMI.  Heart regular S1/S2, no S3/S4, no murmur.  No peripheral edema.   Abdomen: Soft, nontender, no hepatosplenomegaly, no distention.  Skin: Intact without lesions or rashes.  Neurologic: Alert and oriented x 3.  Psych: Normal affect. Extremities: No clubbing or cyanosis.  HEENT: Normal.   Obstipation has resolved, had large BM last night.   Continue to push po intake with AKI.  Continue to hold Lokelma , Entresto , spironolactone ,  and Lasix .  Would like to eventually get him back on these but need to see renal function recovery.    No chest pain with ranolazine .    Encourage po intake and ambulation.   Wyatt Donovan 03/03/2024 8:55 AM

## 2024-03-03 NOTE — Plan of Care (Signed)
   Problem: Education: Goal: Knowledge of General Education information will improve Description: Including pain rating scale, medication(s)/side effects and non-pharmacologic comfort measures Outcome: Progressing   Problem: Clinical Measurements: Goal: Cardiovascular complication will be avoided Outcome: Progressing

## 2024-03-04 DIAGNOSIS — I5023 Acute on chronic systolic (congestive) heart failure: Secondary | ICD-10-CM | POA: Diagnosis not present

## 2024-03-04 LAB — CBC
HCT: 41.2 % (ref 39.0–52.0)
Hemoglobin: 13.3 g/dL (ref 13.0–17.0)
MCH: 30 pg (ref 26.0–34.0)
MCHC: 32.3 g/dL (ref 30.0–36.0)
MCV: 93 fL (ref 80.0–100.0)
Platelets: 266 K/uL (ref 150–400)
RBC: 4.43 MIL/uL (ref 4.22–5.81)
RDW: 15.3 % (ref 11.5–15.5)
WBC: 5.5 K/uL (ref 4.0–10.5)
nRBC: 0 % (ref 0.0–0.2)

## 2024-03-04 LAB — BASIC METABOLIC PANEL WITH GFR
Anion gap: 11 (ref 5–15)
BUN: 44 mg/dL — ABNORMAL HIGH (ref 6–20)
CO2: 24 mmol/L (ref 22–32)
Calcium: 9.2 mg/dL (ref 8.9–10.3)
Chloride: 102 mmol/L (ref 98–111)
Creatinine, Ser: 1.61 mg/dL — ABNORMAL HIGH (ref 0.61–1.24)
GFR, Estimated: 55 mL/min — ABNORMAL LOW
Glucose, Bld: 76 mg/dL (ref 70–99)
Potassium: 4.7 mmol/L (ref 3.5–5.1)
Sodium: 137 mmol/L (ref 135–145)

## 2024-03-04 MED ORDER — SENNOSIDES-DOCUSATE SODIUM 8.6-50 MG PO TABS: 1.0000 | ORAL_TABLET | Freq: Every day | ORAL | Status: DC

## 2024-03-04 NOTE — Plan of Care (Signed)

## 2024-03-04 NOTE — Progress Notes (Signed)
 Patient ID: Wyatt Donovan, male   DOB: December 28, 1985, 39 y.o.   MRN: [id number]     Cardiologist: Ezra Shuck, MD  Chief Complaint: Chest pain     Doing well today with no complaints. Able to tolerate his liquid diet.  Objective:   Weight Range: 97.3 kg Body mass index is 28.3 kg/m.   Vital Signs:   Temp:  [97.4 F (36.3 C)-98 F (36.7 C)] 97.4 F (36.3 C) (01/03 0712) Pulse Rate:  [58-83] 72 (01/03 0712) Resp:  [17-20] 20 (01/03 0712) BP: (80-113)/(46-80) 110/69 (01/03 0712) SpO2:  [93 %-99 %] 94 % (01/03 0712) Weight:  [97.3 kg] 97.3 kg (01/03 0416) Last BM Date : 03/03/24  Weight change: Filed Weights   03/02/24 0405 03/03/24 0558 03/04/24 0416  Weight: 98.8 kg 97.6 kg 97.3 kg    Intake/Output:   Intake/Output Summary (Last 24 hours) at 03/04/2024 0937 Last data filed at 03/04/2024 0420 Gross per 24 hour  Intake 120 ml  Output 950 ml  Net -830 ml     Physical Exam  Physical Exam Vitals and nursing note reviewed.  Constitutional:      Appearance: Normal appearance.  HENT:     Head: Normocephalic and atraumatic.  Eyes:     Conjunctiva/sclera: Conjunctivae normal.  Cardiovascular:     Rate and Rhythm: Normal rate and regular rhythm.  Pulmonary:     Effort: Pulmonary effort is normal.     Breath sounds: Normal breath sounds.  Musculoskeletal:        General: No swelling or tenderness.  Skin:    Coloration: Skin is not jaundiced or pale.  Neurological:     Mental Status: He is alert.      Telemetry   A sensed V paced  Labs   CBC Recent Labs    03/03/24 0231 03/04/24 0318  WBC 5.7 5.5  HGB 14.7 13.3  HCT 46.5 41.2  MCV 94.9 93.0  PLT 298 266   Basic Metabolic Panel Recent Labs    98/98/73 0349 03/03/24 0231 03/04/24 0318  NA 138 137 137  K 4.8 4.5 4.7  CL 97* 99 102  CO2 26 28 24   GLUCOSE 96 88 76  BUN 45* 54* 44*  CREATININE 1.77* 1.82* 1.61*  CALCIUM  9.7 8.8* 9.2  MG 3.0*  --   --    Liver Function Tests No results for  input(s): AST, ALT, ALKPHOS, BILITOT, PROT, ALBUMIN  in the last 72 hours. No results for input(s): LIPASE, AMYLASE in the last 72 hours. Cardiac Enzymes No results for input(s): CKTOTAL, CKMB, CKMBINDEX, TROPONINI in the last 72 hours.  BNP: BNP (last 3 results) Recent Labs    04/27/23 0945 06/08/23 1040 01/31/24 0943  BNP 140.7* 135.4* 392.9*    ProBNP (last 3 results) Recent Labs    02/23/24 1323  PROBNP 668.0*     D-Dimer No results for input(s): DDIMER in the last 72 hours. Hemoglobin A1C No results for input(s): HGBA1C in the last 72 hours. Fasting Lipid Panel No results for input(s): CHOL, HDL, LDLCALC, TRIG, CHOLHDL, LDLDIRECT in the last 72 hours. Medications:   Scheduled Medications:  apixaban   5 mg Oral BID   atorvastatin   80 mg Oral Daily   bisacodyl   10 mg Rectal Once   carvedilol   3.125 mg Oral BID WC   dapagliflozin  propanediol  10 mg Oral Daily   mexiletine  200 mg Oral BID   pantoprazole   40 mg Oral BID AC   ranolazine   500 mg Oral BID   senna-docusate  1 tablet Oral QHS   sodium chloride  flush  3 mL Intravenous Q12H    Infusions:   PRN Medications: acetaminophen , albuterol , nitroGLYCERIN , ondansetron  (ZOFRAN ) IV, sodium chloride  flush  Assessment/Plan  1. 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  - Ranolazine  was added, should help.  - Continue Coreg  3.125 bid.   2. CAD: Early onset CAD s/p MI with LAD PCI in 2012,followed by CABG  x 4 4/22  Admit 10/22 with NSTEMI, cath showed patent LIMA to LAD, all other grafts occluded. S/p PCI DES native mid LCx 99% stenosis. Repeat 01/2023 LHC showed coronary anatomy unchanged; grafts all occluded (known prior) except LIMA-LAD.  Occluded native RCA (known from prior) with patent mid LCx stent.  He had constant chest pain for > 1 week with minimally elevated troponin/no trend.  No ACS suspected.   Pain has now completely resolved.  - He is now off Plavix  (stopped 8/23 due to BRBPR). - He is on Eliquis  so no ASA.   - Continue atorvastatin  80 mg daily.  - Continue ranolazine .  - Off Imdur  with Coreg .   3. Acute on chronic systolic CHF: Ischemic cardiomyopathy.  [employer] ICD.  Cardiac MRI in 10/22 showed LVEF 29% and RVEF 45%.  Patient has a cardiac contractility modulator.  Echo 12/24 showed EF 30-35%, global hypokinesis, mild RV dysfunction, IVC not dilated.  GDMT has been limited by hypotension & hyperkalemia, K is now controlled with Lokelma .  Had RHC 12/24 showing normal filling pressures, low cardiac output with CI 2 by Fick, 1.82 by thermodilution. RHC in 12/25 with normal filling pressures but CI 2.02. Echo this admission with EF 20-25%, RV low normal function.  He was initially diuresed at admission, now back on po Lasix .  - Looks euvolemic now.  GDMT was stopped due to hyperkalemia, but had been off his home Lokelma  for uncertain reasons when he developed hyperkalemia. Meds initially restarted; however, with rising creatinine and poor po intake/obstipation, we have had to cut back again.  Creatinine improving   - Hold Lasix  today as he appears euvolemic and wait for renal function to improve, can likely start PO tomorrow  - Digoxin  level was 1.2, poor po intake.  Will hold digoxin  for now, probably restart at 0.0625 mg every other day once renal function improves.    - Continue Coreg  3.125 mg bid for now.  - Continue Farxiga  10 mg daily for now.  - He will stay off spironolactone  for now and now off Entresto  and Lokelma  as well with rising creatinine.   - Nearing end stage ischemic cardiomyopathy.  Has been referred to [facility] [place] for transplant evaluation.  He would be a LVAD candidate if needed.   4. 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  No thrombus on echo 08/22. No thrombus on cMRI 12/06/20.  No thrombus on most recent echo.   - Continue Eliquis  5 mg bid.   5. Hyperkalemia: He has  had a tendency towards hyperkalemia and has been on Lokelma  in order to stay on spironolactone  and Entresto .  We are holding Entresto , spironolactone , and Lokelma  for now as above.    6. Atrial flutter/fibrillation: Both arrhythmias noted in 10/22.  He tolerated poorly with worsened symptoms.  He had DCCV in 10/22. Previously on amiodarone  but later stopped. He is in NSR today.  - Continue Eliquis .  - Would favor ablation if  he has recurrent AF.   7. GI: He had developed abdominal pain and distention with poor po intake as well as nausea/vomiting.  KUB suggestive of SBO but CT abdomen benign. This is now the main issue keeping him in hospital.  Still with distended abdomen (but reports improvement) and nausea/vomiting with po intake so taking in minimal amounts.   - Refusing lactulose , switched to sorbitol    8. AKI: Creatinine improving  - He is off Lasix , spironolactone , Entresto  and Lokelma .    Naila Elizondo E Tzipora Mcinroy 03/04/2024 9:37 AM

## 2024-03-04 NOTE — Progress Notes (Signed)
 " PROGRESS NOTE    Wyatt Donovan  FMW:[id number] DOB: November 05, 1985 DOA: 02/23/2024 PCP: Orpha Yancey LABOR, MD   38/M w HFrEF (EF 30-35%) status post AICD, CAD s/p PCI followed by CABG (LIMA to LAD patent, remaining grafts occluded), paroxysmal atrial fibrillation status post DCCV 2022, intermittent rectal bleeding, LV thrombus, hyperlipidemia, history of tobacco use in remission, and frequent PVCs presenting with worsening dyspnea approximately 3 to 4 days  -echo shows depressed EF at 20 to 25% with global hypokinesia and low normal RVEF  -complicated by constipation/hemorrhoids etc - 12/31, refused enema and suppository -1/1, large BM after enema - 1/2, multiple BMs after sorbitol , BP dropped to the 80s, given his 500 mL LR bolus   Subjective: - Feels better overall, multiple BMs yesterday, no further nausea or vomiting  Assessment and Plan:  Acute on chronic systolic CHF - Known ischemic cardiomyopathy - Echo 12/24 noted EF of 30-35% mildly reduced RV - CHF team following, echo now noted EF of 20-25% with low normal RV - Diuresed with IV Lasix  this admission, CHF team following, GDMT limited by recurrent hyperkalemia - Then complicated by AKI in the setting of GI issues, diuretics and GDMT on hold - Creatinine now improving, continue Coreg  and Farxiga ,  Constipation, abdominal distention nausea and vomiting - Moderate stool burden on x-ray, CTA overnight was unremarkable - No BM in 6 days, was on narcotics initially - Multiple BMs in the last 48 hours following enema and sorbitol  - Change Senokot to daily - Advance diet  PVCs On mexiletine, continue Coreg  - Started on Ranexa  this admission  CAD - Prior PCI to LAD in 2012, CABG 4/22 - Cath 10/22 noted patent LIMA to LAD, all other grafts occluded, status post PCI to native left circumflex 99% stenosis - Repeat cath 12/24 grafts all occluded except LIMA to LAD - Now on Eliquis , statin, Coreg   History of LV thrombus - Not  noted on recent echoes, initially seen on cardiac MRI 5/22 Continue Eliquis   Recurrent hyperkalemia - Now off Aldactone  - entresto  stopped now  Atrial flutter/fibrillation - Now in sinus rhythm, continue Eliquis , Coreg      DVT prophylaxis: Eliquis  Code Status: Full code Family Communication: None present Disposition Plan: home iwhen improved  Consultants:    Procedures:   Antimicrobials:    Objective: Vitals:   03/03/24 2353 03/04/24 0326 03/04/24 0416 03/04/24 0712  BP: 105/74 113/80  110/69  Pulse: (!) 58 79  72  Resp: 18 18  20   Temp: 98 F (36.7 C) (!) 97.5 F (36.4 C)  (!) 97.4 F (36.3 C)  TempSrc: Oral Oral  Oral  SpO2: 98% 96%  94%  Weight:   97.3 kg   Height:        Intake/Output Summary (Last 24 hours) at 03/04/2024 1038 Last data filed at 03/04/2024 0420 Gross per 24 hour  Intake 120 ml  Output 950 ml  Net -830 ml   Filed Weights   03/02/24 0405 03/03/24 0558 03/04/24 0416  Weight: 98.8 kg 97.6 kg 97.3 kg    Examination:  General exam: Chronically ill appears much older than stated age, AO x 3, no distress Respiratory system: Clear Cardiovascular system: S1 & S2 heard, RRR.  Abd: Much less distended, nontender, bowel sounds present Central nervous system: Alert and oriented. No focal neurological deficits. Extremities: no edema Skin: No rashes Psychiatry: Flat affect   Data Reviewed:   CBC: Recent Labs  Lab 02/29/24 0325 03/01/24 0317 03/02/24 0349 03/03/24 0231  03/04/24 0318  WBC 7.7 11.9* 9.8 5.7 5.5  HGB 13.5 15.3 15.5 14.7 13.3  HCT 41.8 47.8 49.2 46.5 41.2  MCV 94.8 95.2 94.3 94.9 93.0  PLT 237 294 365 298 266   Basic Metabolic Panel: Recent Labs  Lab 02/29/24 0325 03/01/24 0317 03/02/24 0349 03/03/24 0231 03/04/24 0318  NA 139 137 138 137 137  K 5.2* 5.5* 4.8 4.5 4.7  CL 104 99 97* 99 102  CO2 25 26 26 28 24   GLUCOSE 104* 97 96 88 76  BUN 24* 27* 45* 54* 44*  CREATININE 1.22 1.27* 1.77* 1.82* 1.61*  CALCIUM   8.9 9.4 9.7 8.8* 9.2  MG  --   --  3.0*  --   --    GFR: Estimated Creatinine Clearance: 75 mL/min (A) (by C-G formula based on SCr of 1.61 mg/dL (H)). Liver Function Tests: No results for input(s): AST, ALT, ALKPHOS, BILITOT, PROT, ALBUMIN  in the last 168 hours.  No results for input(s): LIPASE, AMYLASE in the last 168 hours. No results for input(s): AMMONIA in the last 168 hours. Coagulation Profile: No results for input(s): INR, PROTIME in the last 168 hours. Cardiac Enzymes: No results for input(s): CKTOTAL, CKMB, CKMBINDEX, TROPONINI in the last 168 hours. BNP (last 3 results) Recent Labs    02/23/24 1323  PROBNP 668.0*   HbA1C: No results for input(s): HGBA1C in the last 72 hours. CBG: No results for input(s): GLUCAP in the last 168 hours.  Lipid Profile: No results for input(s): CHOL, HDL, LDLCALC, TRIG, CHOLHDL, LDLDIRECT in the last 72 hours. Thyroid  Function Tests: No results for input(s): TSH, T4TOTAL, FREET4, T3FREE, THYROIDAB in the last 72 hours. Anemia Panel: No results for input(s): VITAMINB12, FOLATE, FERRITIN, TIBC, IRON, RETICCTPCT in the last 72 hours. Urine analysis:    Component Value Date/Time   COLORURINE YELLOW 02/18/2023 0105   APPEARANCEUR CLEAR 02/18/2023 0105   LABSPEC 1.024 02/18/2023 0105   PHURINE 5.0 02/18/2023 0105   GLUCOSEU >=500 (A) 02/18/2023 0105   HGBUR NEGATIVE 02/18/2023 0105   BILIRUBINUR NEGATIVE 02/18/2023 0105   KETONESUR NEGATIVE 02/18/2023 0105   PROTEINUR NEGATIVE 02/18/2023 0105   NITRITE NEGATIVE 02/18/2023 0105   LEUKOCYTESUR NEGATIVE 02/18/2023 0105   Sepsis Labs: @LABRCNTIP (procalcitonin:4,lacticidven:4)  )No results found for this or any previous visit (from the past 240 hours).   Radiology Studies: No results found.    Scheduled Meds:  apixaban   5 mg Oral BID   atorvastatin   80 mg Oral Daily   bisacodyl   10 mg Rectal Once   carvedilol    3.125 mg Oral BID WC   dapagliflozin  propanediol  10 mg Oral Daily   mexiletine  200 mg Oral BID   pantoprazole   40 mg Oral BID AC   ranolazine   500 mg Oral BID   senna-docusate  1 tablet Oral QHS   sodium chloride  flush  3 mL Intravenous Q12H   Continuous Infusions:   LOS: 10 days    Time spent:    Sigurd Pac, MD [facility]   03/04/2024, 10:38 AM    "

## 2024-03-04 NOTE — Plan of Care (Signed)
°  Problem: Education: Goal: Knowledge of General Education information will improve Description: Including pain rating scale, medication(s)/side effects and non-pharmacologic comfort measures Outcome: Progressing   Problem: Clinical Measurements: Goal: Ability to maintain clinical measurements within normal limits will improve Outcome: Progressing Goal: Will remain free from infection Outcome: Progressing Goal: Diagnostic test results will improve Outcome: Progressing Goal: Respiratory complications will improve Outcome: Progressing Goal: Cardiovascular complication will be avoided Outcome: Progressing   Problem: Activity: Goal: Risk for activity intolerance will decrease Outcome: Progressing   Problem: Nutrition: Goal: Adequate nutrition will be maintained Outcome: Progressing   Problem: Coping: Goal: Level of anxiety will decrease Outcome: Progressing   Problem: Elimination: Goal: Will not experience complications related to bowel motility Outcome: Progressing   Problem: Pain Managment: Goal: General experience of comfort will improve and/or be controlled Outcome: Progressing   Problem: Safety: Goal: Ability to remain free from injury will improve Outcome: Progressing

## 2024-03-05 DIAGNOSIS — I5023 Acute on chronic systolic (congestive) heart failure: Secondary | ICD-10-CM | POA: Diagnosis not present

## 2024-03-05 LAB — BASIC METABOLIC PANEL WITH GFR
Anion gap: 9 (ref 5–15)
BUN: 30 mg/dL — ABNORMAL HIGH (ref 6–20)
CO2: 25 mmol/L (ref 22–32)
Calcium: 9 mg/dL (ref 8.9–10.3)
Chloride: 105 mmol/L (ref 98–111)
Creatinine, Ser: 1.29 mg/dL — ABNORMAL HIGH (ref 0.61–1.24)
GFR, Estimated: >60 mL/min
Glucose, Bld: 77 mg/dL (ref 70–99)
Potassium: 5.2 mmol/L — ABNORMAL HIGH (ref 3.5–5.1)
Sodium: 139 mmol/L (ref 135–145)

## 2024-03-05 LAB — CBC
HCT: 40.3 % (ref 39.0–52.0)
Hemoglobin: 13.2 g/dL (ref 13.0–17.0)
MCH: 30.4 pg (ref 26.0–34.0)
MCHC: 32.8 g/dL (ref 30.0–36.0)
MCV: 92.9 fL (ref 80.0–100.0)
Platelets: 275 K/uL (ref 150–400)
RBC: 4.34 MIL/uL (ref 4.22–5.81)
RDW: 15 % (ref 11.5–15.5)
WBC: 5.5 K/uL (ref 4.0–10.5)
nRBC: 0 % (ref 0.0–0.2)

## 2024-03-05 MED ORDER — SODIUM ZIRCONIUM CYCLOSILICATE 5 G PO PACK
5.0000 g | PACK | Freq: Once | ORAL | Status: AC
  Administered 2024-03-05: 5 g via ORAL
  Filled 2024-03-05: qty 1

## 2024-03-05 MED ORDER — FUROSEMIDE 20 MG PO TABS
20.0000 mg | ORAL_TABLET | Freq: Every day | ORAL | Status: DC
  Administered 2024-03-05 – 2024-03-06 (×2): 20 mg via ORAL
  Filled 2024-03-05 (×2): qty 1

## 2024-03-05 MED ORDER — SENNA 8.6 MG PO TABS
1.0000 | ORAL_TABLET | Freq: Every day | ORAL | Status: DC
  Administered 2024-03-06: 8.6 mg via ORAL
  Filled 2024-03-05: qty 1

## 2024-03-05 MED ORDER — DOCUSATE SODIUM 100 MG PO CAPS
100.0000 mg | ORAL_CAPSULE | Freq: Two times a day (BID) | ORAL | Status: DC | PRN
  Administered 2024-03-05: 100 mg via ORAL
  Filled 2024-03-05 (×2): qty 1

## 2024-03-05 NOTE — Plan of Care (Signed)
  Problem: Education: Goal: Knowledge of General Education information will improve Description: Including pain rating scale, medication(s)/side effects and non-pharmacologic comfort measures Outcome: Progressing   Problem: Clinical Measurements: Goal: Diagnostic test results will improve Outcome: Progressing   Problem: Clinical Measurements: Goal: Cardiovascular complication will be avoided Outcome: Progressing   Problem: Clinical Measurements: Goal: Respiratory complications will improve Outcome: Progressing

## 2024-03-05 NOTE — Progress Notes (Signed)
 " PROGRESS NOTE    Wyatt Donovan  FMW:[id number] DOB: 26-Oct-1985 DOA: 02/23/2024 PCP: Orpha Yancey LABOR, MD   38/M w HFrEF (EF 30-35%) status post AICD, CAD s/p PCI followed by CABG (LIMA to LAD patent, remaining grafts occluded), paroxysmal atrial fibrillation status post DCCV 2022, intermittent rectal bleeding, LV thrombus, hyperlipidemia, history of tobacco use in remission, and frequent PVCs presenting with worsening dyspnea approximately 3 to 4 days  -echo shows depressed EF at 20 to 25% with global hypokinesia and low normal RVEF  -complicated by constipation/hemorrhoids etc - 12/31, refused enema and suppository -1/1, large BM after enema - 1/2, multiple BMs after sorbitol , BP dropped to the 80s, given his 500 mL LR bolus   Subjective: - Feels better overall, abdomen feels back to normal, tolerating diet, breathing is good  Assessment and Plan:  Acute on chronic systolic CHF - Known ischemic cardiomyopathy - Echo 12/24 noted EF of 30-35% mildly reduced RV - CHF team following, echo now noted EF of 20-25% with low normal RV - Diuresed with IV Lasix  this admission, CHF team following, GDMT limited by recurrent hyperkalemia - Then complicated by AKI in the setting of GI issues, diuretics and GDMT on hold - Creatinine now improving, continue Coreg  and Farxiga , ?  Restart Lasix  today - Discharge planning  Recurrent hyperkalemia - Now off Aldactone  and Entresto  -?  Resume diuretics, add low-dose Lokelma  today  Constipation, abdominal distention nausea and vomiting -Resolved - Moderate stool burden on x-ray, CTA overnight was unremarkable - was on narcotics initially - Multiple BMs in the last 72 hours following enema and sorbitol  - Changed to nightly Senokot, tolerating advance diet  PVCs On mexiletine, continue Coreg  - Started on Ranexa  this admission  CAD - Prior PCI to LAD in 2012, CABG 4/22 - Cath 10/22 noted patent LIMA to LAD, all other grafts occluded, status post  PCI to native left circumflex 99% stenosis - Repeat cath 12/24 grafts all occluded except LIMA to LAD - Now on Eliquis , statin, Coreg   History of LV thrombus - Not noted on recent echoes, initially seen on cardiac MRI 5/22 Continue Eliquis   Atrial flutter/fibrillation - Now in sinus rhythm, continue Eliquis , Coreg      DVT prophylaxis: Eliquis  Code Status: Full code Family Communication: None present Disposition Plan: home tomorrow  Consultants:    Procedures:   Antimicrobials:    Objective: Vitals:   03/04/24 1500 03/04/24 1937 03/04/24 2353 03/05/24 0420  BP: 106/73 95/69 92/68  101/78  Pulse: 72 67 69   Resp: 20 18 18 18   Temp:  97.7 F (36.5 C) 98 F (36.7 C) 97.9 F (36.6 C)  TempSrc: Oral Oral Oral Oral  SpO2: 95% 98% 99% (!) 62%  Weight:    97.1 kg  Height:        Intake/Output Summary (Last 24 hours) at 03/05/2024 1011 Last data filed at 03/04/2024 2355 Gross per 24 hour  Intake 270 ml  Output 1450 ml  Net -1180 ml   Filed Weights   03/03/24 0558 03/04/24 0416 03/05/24 0420  Weight: 97.6 kg 97.3 kg 97.1 kg    Examination:  General exam: Chronically ill, AO x 3, no distress Respiratory system: Clear Cardiovascular system: S1 & S2 heard, RRR.  Abd: Soft, less distended, nontender, bowel sounds present Central nervous system: Alert and oriented. No focal neurological deficits. Extremities: no edema Skin: No rashes Psychiatry: Flat affect   Data Reviewed:   CBC: Recent Labs  Lab 03/01/24 0317 03/02/24 0349 03/03/24  0231 03/04/24 0318 03/05/24 0155  WBC 11.9* 9.8 5.7 5.5 5.5  HGB 15.3 15.5 14.7 13.3 13.2  HCT 47.8 49.2 46.5 41.2 40.3  MCV 95.2 94.3 94.9 93.0 92.9  PLT 294 365 298 266 275   Basic Metabolic Panel: Recent Labs  Lab 03/01/24 0317 03/02/24 0349 03/03/24 0231 03/04/24 0318 03/05/24 0155  NA 137 138 137 137 139  K 5.5* 4.8 4.5 4.7 5.2*  CL 99 97* 99 102 105  CO2 26 26 28 24 25   GLUCOSE 97 96 88 76 77  BUN 27* 45*  54* 44* 30*  CREATININE 1.27* 1.77* 1.82* 1.61* 1.29*  CALCIUM  9.4 9.7 8.8* 9.2 9.0  MG  --  3.0*  --   --   --    GFR: Estimated Creatinine Clearance: 93.5 mL/min (A) (by C-G formula based on SCr of 1.29 mg/dL (H)). Liver Function Tests: No results for input(s): AST, ALT, ALKPHOS, BILITOT, PROT, ALBUMIN  in the last 168 hours.  No results for input(s): LIPASE, AMYLASE in the last 168 hours. No results for input(s): AMMONIA in the last 168 hours. Coagulation Profile: No results for input(s): INR, PROTIME in the last 168 hours. Cardiac Enzymes: No results for input(s): CKTOTAL, CKMB, CKMBINDEX, TROPONINI in the last 168 hours. BNP (last 3 results) Recent Labs    02/23/24 1323  PROBNP 668.0*   HbA1C: No results for input(s): HGBA1C in the last 72 hours. CBG: No results for input(s): GLUCAP in the last 168 hours.  Lipid Profile: No results for input(s): CHOL, HDL, LDLCALC, TRIG, CHOLHDL, LDLDIRECT in the last 72 hours. Thyroid  Function Tests: No results for input(s): TSH, T4TOTAL, FREET4, T3FREE, THYROIDAB in the last 72 hours. Anemia Panel: No results for input(s): VITAMINB12, FOLATE, FERRITIN, TIBC, IRON, RETICCTPCT in the last 72 hours. Urine analysis:    Component Value Date/Time   COLORURINE YELLOW 02/18/2023 0105   APPEARANCEUR CLEAR 02/18/2023 0105   LABSPEC 1.024 02/18/2023 0105   PHURINE 5.0 02/18/2023 0105   GLUCOSEU >=500 (A) 02/18/2023 0105   HGBUR NEGATIVE 02/18/2023 0105   BILIRUBINUR NEGATIVE 02/18/2023 0105   KETONESUR NEGATIVE 02/18/2023 0105   PROTEINUR NEGATIVE 02/18/2023 0105   NITRITE NEGATIVE 02/18/2023 0105   LEUKOCYTESUR NEGATIVE 02/18/2023 0105   Sepsis Labs: @LABRCNTIP (procalcitonin:4,lacticidven:4)  )No results found for this or any previous visit (from the past 240 hours).   Radiology Studies: No results found.    Scheduled Meds:  apixaban   5 mg Oral BID    atorvastatin   80 mg Oral Daily   bisacodyl   10 mg Rectal Once   carvedilol   3.125 mg Oral BID WC   dapagliflozin  propanediol  10 mg Oral Daily   mexiletine  200 mg Oral BID   pantoprazole   40 mg Oral BID AC   ranolazine   500 mg Oral BID   senna-docusate  1 tablet Oral QHS   sodium chloride  flush  3 mL Intravenous Q12H   Continuous Infusions:   LOS: 11 days    Time spent:    Sigurd Pac, MD [facility]   03/05/2024, 10:11 AM    "

## 2024-03-05 NOTE — Progress Notes (Signed)
 Patient ID: Wyatt Donovan, male   DOB: December 25, 1985, 39 y.o.   MRN: [id number]     Advanced Heart Failure Rounding Note  Cardiologist: Ezra Shuck, MD  Chief Complaint: Chest pain     Doing well, no complaints today.   Objective:   Weight Range: 97.1 kg Body mass index is 28.25 kg/m.   Vital Signs:   Temp:  [97.7 F (36.5 C)-98 F (36.7 C)] 97.9 F (36.6 C) (01/04 0420) Pulse Rate:  [67-72] 69 (01/03 2353) Resp:  [18-20] 18 (01/04 0420) BP: (92-106)/(68-78) 101/78 (01/04 0420) SpO2:  [62 %-99 %] 62 % (01/04 0420) Weight:  [97.1 kg] 97.1 kg (01/04 0420) Last BM Date : 03/04/24  Weight change: Filed Weights   03/03/24 0558 03/04/24 0416 03/05/24 0420  Weight: 97.6 kg 97.3 kg 97.1 kg    Intake/Output:   Intake/Output Summary (Last 24 hours) at 03/05/2024 1010 Last data filed at 03/04/2024 2355 Gross per 24 hour  Intake 270 ml  Output 1450 ml  Net -1180 ml     Physical Exam  Physical Exam Vitals and nursing note reviewed.  Constitutional:      Appearance: Normal appearance.  HENT:     Head: Normocephalic and atraumatic.  Eyes:     Conjunctiva/sclera: Conjunctivae normal.  Cardiovascular:     Rate and Rhythm: Normal rate and regular rhythm.  Pulmonary:     Effort: Pulmonary effort is normal.     Breath sounds: Normal breath sounds.  Musculoskeletal:        General: No swelling or tenderness.  Skin:    Coloration: Skin is not jaundiced or pale.  Neurological:     Mental Status: He is alert.      Telemetry   A sensed V paced  Labs   CBC Recent Labs    03/04/24 0318 03/05/24 0155  WBC 5.5 5.5  HGB 13.3 13.2  HCT 41.2 40.3  MCV 93.0 92.9  PLT 266 275   Basic Metabolic Panel Recent Labs    98/96/73 0318 03/05/24 0155  NA 137 139  K 4.7 5.2*  CL 102 105  CO2 24 25  GLUCOSE 76 77  BUN 44* 30*  CREATININE 1.61* 1.29*  CALCIUM  9.2 9.0   Liver Function Tests No results for input(s): AST, ALT, ALKPHOS, BILITOT, PROT,  ALBUMIN  in the last 72 hours. No results for input(s): LIPASE, AMYLASE in the last 72 hours. Cardiac Enzymes No results for input(s): CKTOTAL, CKMB, CKMBINDEX, TROPONINI in the last 72 hours.  BNP: BNP (last 3 results) Recent Labs    04/27/23 0945 06/08/23 1040 01/31/24 0943  BNP 140.7* 135.4* 392.9*    ProBNP (last 3 results) Recent Labs    02/23/24 1323  PROBNP 668.0*     D-Dimer No results for input(s): DDIMER in the last 72 hours. Hemoglobin A1C No results for input(s): HGBA1C in the last 72 hours. Fasting Lipid Panel No results for input(s): CHOL, HDL, LDLCALC, TRIG, CHOLHDL, LDLDIRECT in the last 72 hours. Medications:   Scheduled Medications:  apixaban   5 mg Oral BID   atorvastatin   80 mg Oral Daily   bisacodyl   10 mg Rectal Once   carvedilol   3.125 mg Oral BID WC   dapagliflozin  propanediol  10 mg Oral Daily   mexiletine  200 mg Oral BID   pantoprazole   40 mg Oral BID AC   ranolazine   500 mg Oral BID   senna-docusate  1 tablet Oral QHS   sodium chloride  flush  3  mL Intravenous Q12H    Infusions:   PRN Medications: acetaminophen , albuterol , nitroGLYCERIN , ondansetron  (ZOFRAN ) IV, sodium chloride  flush  Assessment/Plan  1. 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  - Ranolazine  was added - Continue Coreg  3.125 bid.   2. CAD: Early onset CAD s/p MI with LAD PCI in 2012, followed by CABG  x 4 4/22  Admit 10/22 with NSTEMI, cath showed patent LIMA to LAD, all other grafts occluded. S/p PCI DES native mid LCx 99% stenosis. Repeat 01/2023 LHC showed coronary anatomy unchanged; grafts all occluded (known prior) except LIMA-LAD.  Occluded native RCA (known from prior) with patent mid LCx stent.  He had constant chest pain for > 1 week with minimally elevated troponin/no trend.  No ACS suspected.  Pain has now completely resolved.  - He is now off Plavix  (stopped  8/23 due to BRBPR). - He is on Eliquis  so no ASA.   - Continue atorvastatin  80 mg daily.  - Continue ranolazine .  - Off Imdur  with Coreg .   3. Acute on chronic systolic CHF: Ischemic cardiomyopathy.  [employer] ICD.  Cardiac MRI in 10/22 showed LVEF 29% and RVEF 45%.  Patient has a cardiac contractility modulator.  Echo 12/24 showed EF 30-35%, global hypokinesis, mild RV dysfunction, IVC not dilated.  GDMT has been limited by hypotension & hyperkalemia, received Lokelma . K up today.  Had RHC 12/24 showing normal filling pressures, low cardiac output with CI 2 by Fick, 1.82 by thermodilution. RHC in 12/25 with normal filling pressures but CI 2.02. Echo this admission with EF 20-25%, RV low normal function.  He was initially diuresed at admission, now back on po Lasix .  - Looks euvolemic now.  GDMT was stopped due to hyperkalemia, but had been off his home Lokelma  for uncertain reasons when he developed hyperkalemia. Meds initially restarted; however, with rising creatinine and poor po intake/obstipation, had to cut back again.  Creatinine improving   - Restart lasix  20 mg PO today  - Digoxin  level was 1.2, poor po intake.   - hold digoxin  for now, probably restart at 0.0625 mg every other day once renal function improves.    - Continue Coreg  3.125 mg bid for now.  - Continue Farxiga  10 mg daily for now.  - off spironolactone  for now and now off Entresto  with hyperkalemia - Nearing end stage ischemic cardiomyopathy.  Has been referred to [facility] for transplant evaluation.  He would be a LVAD candidate if needed.   4. 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  No thrombus on echo 08/22. No thrombus on cMRI 12/06/20.  No thrombus on most recent echo.   - Continue Eliquis  5 mg bid.   5. Hyperkalemia: He has had a tendency towards hyperkalemia and has been on Lokelma  in order to stay on spironolactone  and Entresto .  We are holding Entresto , spironolactone , and Lokelma  for now as above.  K back up to 5.2  today.  - restart Lokelma  today  6. Atrial flutter/fibrillation: Both arrhythmias noted in 10/22.  He tolerated poorly with worsened symptoms.  He had DCCV in 10/22. Previously on amiodarone  but later stopped. He is in NSR today.  - Continue Eliquis .  - Would favor ablation if he has recurrent AF.   7. GI: He had developed abdominal pain and distention with poor po intake as well as nausea/vomiting.  KUB suggestive of SBO but CT abdomen benign. This is now the  main issue keeping him in hospital.  Still with distended abdomen (but reports improvement) and nausea/vomiting with po intake so taking in minimal amounts.  Refused lactulose , switched to sorbitol , now off.  8. AKI: Creatinine improving  - He has been off Lasix , spironolactone , Entresto  and Lokelma .    Jossiah Smoak E Edrei Norgaard 03/05/2024 10:10 AM

## 2024-03-06 ENCOUNTER — Other Ambulatory Visit (HOSPITAL_COMMUNITY): Payer: Self-pay

## 2024-03-06 DIAGNOSIS — I493 Ventricular premature depolarization: Secondary | ICD-10-CM | POA: Diagnosis not present

## 2024-03-06 DIAGNOSIS — I2510 Atherosclerotic heart disease of native coronary artery without angina pectoris: Secondary | ICD-10-CM | POA: Diagnosis not present

## 2024-03-06 DIAGNOSIS — I5023 Acute on chronic systolic (congestive) heart failure: Secondary | ICD-10-CM | POA: Diagnosis not present

## 2024-03-06 LAB — BASIC METABOLIC PANEL WITH GFR
Anion gap: 9 (ref 5–15)
BUN: 28 mg/dL — ABNORMAL HIGH (ref 6–20)
CO2: 27 mmol/L (ref 22–32)
Calcium: 9.2 mg/dL (ref 8.9–10.3)
Chloride: 106 mmol/L (ref 98–111)
Creatinine, Ser: 1.37 mg/dL — ABNORMAL HIGH (ref 0.61–1.24)
GFR, Estimated: >60 mL/min
Glucose, Bld: 103 mg/dL — ABNORMAL HIGH (ref 70–99)
Potassium: 5.2 mmol/L — ABNORMAL HIGH (ref 3.5–5.1)
Sodium: 141 mmol/L (ref 135–145)

## 2024-03-06 MED ORDER — CARVEDILOL 3.125 MG PO TABS
3.1250 mg | ORAL_TABLET | Freq: Two times a day (BID) | ORAL | 3 refills | Status: AC
  Filled 2024-03-06: qty 60, 30d supply, fill #0
  Filled 2024-03-06: qty 15, 8d supply, fill #0

## 2024-03-06 MED ORDER — LOKELMA 5 G PO PACK
5.0000 g | PACK | Freq: Every day | ORAL | 6 refills | Status: AC
  Filled 2024-03-06: qty 30, 30d supply, fill #0

## 2024-03-06 MED ORDER — MEXILETINE HCL 200 MG PO CAPS
200.0000 mg | ORAL_CAPSULE | Freq: Two times a day (BID) | ORAL | 6 refills | Status: AC
  Filled 2024-03-06: qty 60, 30d supply, fill #0

## 2024-03-06 MED ORDER — APIXABAN 5 MG PO TABS
5.0000 mg | ORAL_TABLET | Freq: Two times a day (BID) | ORAL | 6 refills | Status: AC
  Filled 2024-03-06: qty 60, 30d supply, fill #0

## 2024-03-06 MED ORDER — FUROSEMIDE 20 MG PO TABS
20.0000 mg | ORAL_TABLET | Freq: Every day | ORAL | 3 refills | Status: AC
  Filled 2024-03-06: qty 90, 90d supply, fill #0

## 2024-03-06 MED ORDER — SENNA 8.6 MG PO TABS
1.0000 | ORAL_TABLET | Freq: Every day | ORAL | 0 refills | Status: AC
  Filled 2024-03-06: qty 120, 120d supply, fill #0

## 2024-03-06 MED ORDER — RANOLAZINE ER 500 MG PO TB12
500.0000 mg | ORAL_TABLET | Freq: Two times a day (BID) | ORAL | 3 refills | Status: AC
  Filled 2024-03-06: qty 60, 30d supply, fill #0

## 2024-03-06 MED ORDER — SODIUM ZIRCONIUM CYCLOSILICATE 5 G PO PACK
5.0000 g | PACK | Freq: Once | ORAL | Status: AC
  Administered 2024-03-06: 5 g via ORAL
  Filled 2024-03-06: qty 1

## 2024-03-06 MED ORDER — ATORVASTATIN CALCIUM 80 MG PO TABS
80.0000 mg | ORAL_TABLET | Freq: Every day | ORAL | 3 refills | Status: AC
  Filled 2024-03-06: qty 90, 90d supply, fill #0

## 2024-03-06 NOTE — Discharge Summary (Addendum)
 Physician Discharge Summary  Wyatt Donovan FMW:[id number] DOB: 24-Apr-1985 DOA: 02/23/2024  PCP: Orpha Yancey LABOR, MD  Admit date: 02/23/2024 Discharge date: 03/06/2024  Time spent: 45 minutes  Recommendations for Outpatient Follow-up:  Advanced heart failure clinic on 1/9, please check BMP at follow-up  Discharge Diagnoses:  Principal Problem:   Acute on chronic HFrEF (heart failure with reduced ejection fraction) (HCC) Recurrent hyperkalemia   Coronary artery disease   Hypercholesterolemia   PVC (premature ventricular contraction)   AKI (acute kidney injury) Chronic constipation History of LV thrombus  Discharge Condition: Improving  Diet recommendation: Heart healthy  Filed Weights   03/04/24 0416 03/05/24 0420 03/06/24 0300  Weight: 97.3 kg 97.1 kg 97.2 kg    History of present illness:   38/M w HFrEF (EF 30-35%) status post AICD, CAD s/p PCI followed by CABG (LIMA to LAD patent, remaining grafts occluded), paroxysmal atrial fibrillation status post DCCV 2022, intermittent rectal bleeding, LV thrombus, hyperlipidemia, history of tobacco use in remission, and frequent PVCs presenting with worsening dyspnea approximately 3 to 4 days  -echo shows depressed EF at 20 to 25% with global hypokinesia and low normal RVEF  -complicated by constipation/hemorrhoids etc - 12/31, refused enema and suppository -1/1, large BM after enema - 1/2, multiple BMs after sorbitol , BP dropped to the 80s, given his 500 mL LR bolus  Hospital Course:   Acute on chronic systolic CHF - Known ischemic cardiomyopathy - Echo 12/24 noted EF of 30-35% mildly reduced RV - CHF team following, echo now noted EF of 20-25% with low normal RV - Diuresed with IV Lasix  this admission, CHF team following, GDMT limited by recurrent hyperkalemia-Entresto  and Aldactone  discontinued - Then complicated by AKI in the setting of GI issues, diuretics and GDMT on hold - Creatinine now improving, continue Coreg  and  Farxiga , restarted oral Lasix  yesterday - Discharged home in a stable condition, has follow-up next week   Recurrent hyperkalemia - Now off Aldactone  and Entresto  - Continue daily Lokelma , resumed Lasix    Constipation, abdominal distention nausea and vomiting -Resolved - Moderate stool burden on x-ray, CTA overnight was unremarkable - was on narcotics initially - Multiple BMs in the last 72 hours following enema and sorbitol  - Advised to continue nightly Senokot, tolerating diet now   PVCs On mexiletine, continue Coreg  - Started on Ranexa  this admission   CAD - Prior PCI to LAD in 2012, CABG 4/22 - Cath 10/22 noted patent LIMA to LAD, all other grafts occluded, status post PCI to native left circumflex 99% stenosis - Repeat cath 12/24 grafts all occluded except LIMA to LAD - Now on Eliquis , statin, Coreg    History of LV thrombus - Not noted on recent echoes, initially seen on cardiac MRI 5/22 Continue Eliquis    Atrial flutter/fibrillation - Now in sinus rhythm, continue Eliquis , Coreg   Discharge Exam: Vitals:   03/06/24 0728 03/06/24 1116  BP: 110/74 110/80  Pulse: 68 65  Resp: 16 17  Temp: 97.9 F (36.6 C) 98.1 F (36.7 C)  SpO2: 98% 97%   Gen: Awake, Alert, Oriented X 3, poor insight HEENT: no JVD Lungs: Good air movement bilaterally, CTAB CVS: S1S2/RRR Abd: soft, Non tender, non distended, BS present Extremities: No edema Skin: no new rashes on exposed skin   Discharge Instructions    Allergies as of 03/06/2024       Reactions   Codeine Itching        Medication List     STOP taking these medications  digoxin  0.125 MG tablet Commonly known as: Lanoxin    Entresto  24-26 MG Generic drug: sacubitril -valsartan    nitroGLYCERIN  0.4 MG SL tablet Commonly known as: NITROSTAT    pantoprazole  40 MG tablet Commonly known as: PROTONIX    spironolactone  25 MG tablet Commonly known as: ALDACTONE        TAKE these medications    acetaminophen  500  MG tablet Commonly known as: TYLENOL  Take 500 mg by mouth every 6 (six) hours as needed for moderate pain (pain score 4-6) or headache.   albuterol  108 (90 Base) MCG/ACT inhaler Commonly known as: VENTOLIN  HFA Inhale 2 puffs into the lungs 3 (three) times a week. for wheezing   apixaban  5 MG Tabs tablet Commonly known as: ELIQUIS  Take 1 tablet (5 mg total) by mouth 2 (two) times daily.   atorvastatin  80 MG tablet Commonly known as: LIPITOR  Take 1 tablet (80 mg total) by mouth daily.   carvedilol  3.125 MG tablet Commonly known as: COREG  Take 1 tablet (3.125 mg total) by mouth 2 (two) times daily with a meal. What changed:  medication strength how much to take   dapagliflozin  propanediol 10 MG Tabs tablet Commonly known as: Farxiga  Take 1 tablet (10 mg total) by mouth daily before breakfast.   furosemide  20 MG tablet Commonly known as: LASIX  Take 1 tablet (20 mg total) by mouth daily.   Lokelma  5 g packet Generic drug: sodium zirconium cyclosilicate  Mix packet as directed and take 5 g by mouth daily.   mexiletine 200 MG capsule Commonly known as: MEXITIL  Take 1 capsule (200 mg total) by mouth 2 (two) times daily. What changed:  medication strength how much to take   OMEGA 3 500 PO Take 500 mg by mouth daily.   polyethylene glycol 17 g packet Commonly known as: MIRALAX  / GLYCOLAX  Take 17 g by mouth daily as needed for mild constipation or moderate constipation.   ranolazine  500 MG 12 hr tablet Commonly known as: RANEXA  Take 1 tablet (500 mg total) by mouth 2 (two) times daily.   senna 8.6 MG Tabs tablet Commonly known as: SENOKOT Take 1 tablet (8.6 mg total) by mouth daily. Start taking on: March 07, 2024       Allergies[1]  Follow-up Information     Hasanaj, Yancey LABOR, MD. Go in 4 day(s).   Specialty: Internal Medicine Why: Hospital PCPfollow up appointment scheduled for March 06, 2024 at 2:00 PM.  PLEASE ARRIVE 10-15 minutes early.  PLEASE call to  cancel/reschedule if you CANNOT make appointment. Contact information: [address] [place] [address] [phone]                  The results of significant diagnostics from this hospitalization (including imaging, microbiology, ancillary and laboratory) are listed below for reference.    Significant Diagnostic Studies: CT ABDOMEN PELVIS W CONTRAST Result Date: 03/01/2024 EXAM: CT ABDOMEN AND PELVIS WITH CONTRAST 03/01/2024 02:40:38 AM TECHNIQUE: CT of the abdomen and pelvis was performed with the administration of intravenous contrast. 75mL iohexol  (OMNIPAQUE ) 350 MG/ML injection was administered. Multiplanar reformatted images are provided for review. Automated exposure control, iterative reconstruction, and/or weight-based adjustment of the mA/kV was utilized to reduce the radiation dose to as low as reasonably achievable. COMPARISON: 01/07/2024. Calcific aortic atherosclerosis. CLINICAL HISTORY: Bowel obstruction suspected. FINDINGS: LOWER CHEST: No acute abnormality. LIVER: The liver is unremarkable. GALLBLADDER AND BILE DUCTS: Gallbladder is unremarkable. No biliary ductal dilatation. SPLEEN: No acute abnormality. PANCREAS: No acute abnormality. ADRENAL GLANDS: No acute abnormality.  KIDNEYS, URETERS AND BLADDER: No stones in the kidneys or ureters. No hydronephrosis. No perinephric or periureteral stranding. Urinary bladder is unremarkable. GI AND BOWEL: Stomach demonstrates no acute abnormality. There is no bowel obstruction. PERITONEUM AND RETROPERITONEUM: No ascites. No free air. VASCULATURE: Aorta is normal in caliber. LYMPH NODES: No lymphadenopathy. REPRODUCTIVE ORGANS: No acute abnormality. BONES AND SOFT TISSUES: No acute osseous abnormality. No focal soft tissue abnormality. IMPRESSION: 1. No CT evidence of bowel obstruction. Electronically signed by: Franky Stanford MD 03/01/2024 03:29 AM EST RP Workstation: [id number]   DG Abd Portable 1V Result Date: 02/29/2024 EXAM:  1 VIEW XRAY OF THE ABDOMEN 02/29/2024 09:33:00 PM COMPARISON: None available. CLINICAL HISTORY: Abdominal pain with vomiting FINDINGS: LINES, TUBES AND DEVICES: Multiple cardiac leads overlie the lower chest. BOWEL: Multiple mildly-to-dilated small bowel loops in the abdomen up to 3.6 cm in diameter, compatible with small bowel obstruction. Mild-to-moderate stool and gas throughout the colon. SOFT TISSUES: No abnormal calcifications. BONES: No acute fracture. IMPRESSION: 1. Small bowel obstruction. 2. Mild-to-moderate stool and gas throughout the colon. Electronically signed by: Greig Pique MD 02/29/2024 11:40 PM EST RP Workstation: [id number]   DG CHEST PORT 1 VIEW Result Date: 02/27/2024 CLINICAL DATA:  Pain. Patient admitted for acute on chronic heart failure. EXAM: PORTABLE CHEST 1 VIEW COMPARISON:  02/23/2024 FINDINGS: Status post median sternotomy. Stable heart size and mediastinal contours. Right and left pacemaker is in place. Minor atelectasis at the lung bases. No confluent opacity. No pulmonary edema, large pleural effusion or pneumothorax. IMPRESSION: Minor atelectasis at the lung bases. Electronically Signed   By: Andrea Gasman M.D.   On: 02/27/2024 10:42   LONG TERM MONITOR (3-14 DAYS) Result Date: 02/26/2024 Patch Wear Time:  7 days and 12 hours (2025-12-01T09:16:27-0500 to 2025-12-08T22:03:48-0500) Patient had a min HR of 49 bpm, max HR of 120 bpm, and avg HR of 73 bpm. Predominant underlying rhythm was Possible Ventricular Pacing. No Isolated SVEs, SVE Couplets, or SVE Triplets were present. Isolated VEs were frequent (14.1%, 81315), VE Couplets were frequent (6.8%, 19583), and VE Triplets were occasional (1.5%, 2902). Conclusion: 1. Predominant NSR 2. 22.4% PVCs   DG Chest Portable 1 View Result Date: 02/23/2024 CLINICAL DATA:  Chest pain EXAM: PORTABLE CHEST 1 VIEW COMPARISON:  October 23, 2022 FINDINGS: Stable cardiomediastinal silhouette. Lungs are clear. Sternotomy wires are  noted. Stable bilateral pacemaker placement. Bony thorax is unremarkable. IMPRESSION: No active disease. Electronically Signed   By: Lynwood Landy Raddle M.D.   On: 02/23/2024 13:32   ECHOCARDIOGRAM COMPLETE Result Date: 02/23/2024    ECHOCARDIOGRAM REPORT   Patient Name:   Wyatt Donovan Date of Exam: 02/23/2024 Medical Rec #:  [id number]        Height:       73.0 in Accession #:    [id number]       Weight:       220.0 lb Date of Birth:  1985/07/26        BSA:          2.241 m Patient Age:    38 years         BP:           82/60 mmHg Patient Gender: M                HR:           79 bpm. Exam Location:  [facility] Procedure: 2D Echo, 3D Echo, Cardiac Doppler, Color Doppler, Strain Analysis and  Intracardiac Opacification Agent (Both Spectral and Color Flow            Doppler were utilized during procedure). Indications:    Congestive Heart Failure l50.9  History:        Patient has prior history of Echocardiogram examinations, most                 recent 02/17/2023. CHF and Cardiomyopathy, CAD and Previous                 Myocardial Infarction, Pacemaker and Defibrillator,                 Arrythmias:Atrial Fibrillation; Risk Factors:Former Smoker and                 Dyslipidemia.  Sonographer:    Aida Pizza RCS Referring Phys: [phone] EZRA GORMAN SHUCK  Sonographer Comments: Global longitudinal strain was attempted. IMPRESSIONS  1. No LV thrombus by Definity . Left ventricular ejection fraction, by estimation, is 20 to 25%. Left ventricular ejection fraction by 3D volume is 23 %. The left ventricle has severely decreased function. The left ventricle demonstrates global hypokinesis. The left ventricular internal cavity size was moderately to severely dilated. Left ventricular diastolic parameters are indeterminate. The average left ventricular global longitudinal strain is -3.9 %. The global longitudinal strain is abnormal.  2. Right ventricular systolic function is low normal. The right ventricular size is  normal. Tricuspid regurgitation signal is inadequate for assessing PA pressure.  3. The mitral valve is normal in structure. No evidence of mitral valve regurgitation. No evidence of mitral stenosis.  4. The aortic valve was not well visualized. Aortic valve regurgitation is not visualized. No aortic stenosis is present.  5. The inferior vena cava is normal in size with greater than 50% respiratory variability, suggesting right atrial pressure of 3 mmHg. Comparison(s): Changes from prior study are noted. LV function worsened with new RWMA. FINDINGS  Left Ventricle: No LV thrombus by Definity . Left ventricular ejection fraction, by estimation, is 20 to 25%. Left ventricular ejection fraction by 3D volume is 23 %. The left ventricle has severely decreased function. The left ventricle demonstrates global hypokinesis. Definity  contrast agent was given IV to delineate the left ventricular endocardial borders. The average left ventricular global longitudinal strain is -3.9 %. Strain was performed and the global longitudinal strain is abnormal. The left ventricular internal cavity size was moderately to severely dilated. There is no left ventricular hypertrophy. Left ventricular diastolic parameters are indeterminate.  LV Wall Scoring: The entire anterior septum, apical lateral segment, and apex are akinetic. The entire anterior wall, antero-lateral wall, entire inferior wall, posterior wall, mid inferoseptal segment, and basal inferoseptal segment are hypokinetic. Right Ventricle: The right ventricular size is normal. No increase in right ventricular wall thickness. Right ventricular systolic function is low normal. Tricuspid regurgitation signal is inadequate for assessing PA pressure. Left Atrium: Left atrial size was normal in size. Right Atrium: Right atrial size was normal in size. Pericardium: There is no evidence of pericardial effusion. Mitral Valve: The mitral valve is normal in structure. No evidence of mitral  valve regurgitation. No evidence of mitral valve stenosis. Tricuspid Valve: The tricuspid valve is normal in structure. Tricuspid valve regurgitation is not demonstrated. No evidence of tricuspid stenosis. Aortic Valve: The aortic valve was not well visualized. Aortic valve regurgitation is not visualized. No aortic stenosis is present. Pulmonic Valve: The pulmonic valve was not well visualized. Pulmonic valve regurgitation is trivial. No evidence of pulmonic  stenosis. Aorta: The aortic root is normal in size and structure. Venous: The inferior vena cava is normal in size with greater than 50% respiratory variability, suggesting right atrial pressure of 3 mmHg. IAS/Shunts: No atrial level shunt detected by color flow Doppler. Additional Comments: 3D was performed not requiring image post processing on an independent workstation and was abnormal.  LEFT VENTRICLE PLAX 2D LVIDd:         6.10 cm         Diastology LVIDs:         5.30 cm         LV e' medial:    7.34 cm/s LV PW:         0.90 cm         LV E/e' medial:  9.3 LV IVS:        0.80 cm         LV e' lateral:   12.20 cm/s LVOT diam:     2.10 cm         LV E/e' lateral: 5.6 LV SV:         33 LV SV Index:   15              2D Longitudinal LVOT Area:     3.46 cm        Strain                                2D Strain GLS   -3.9 %                                Avg: LV Volumes (MOD) LV vol d, MOD    110.0 ml      3D Volume EF A2C:                           LV 3D EF:    Left LV vol d, MOD    143.0 ml                   ventricul A4C:                                        ar LV vol s, MOD    92.8 ml                    ejection A2C:                                        fraction LV vol s, MOD    110.0 ml                   by 3D A4C:                                        volume is LV SV MOD A2C:   17.2 ml                    23 %. LV SV MOD A4C:  143.0 ml LV SV MOD BP:    28.2 ml                                3D Volume EF:                                3D EF:         23 %                                LV EDV:       181 ml                                LV ESV:       139 ml                                LV SV:        42 ml RIGHT VENTRICLE RV S prime:     7.21 cm/s TAPSE (M-mode): 1.6 cm LEFT ATRIUM           Index        RIGHT ATRIUM           Index LA diam:      3.20 cm 1.43 cm/m   RA Area:     12.20 cm LA Vol (A2C): 43.4 ml 19.37 ml/m  RA Volume:   27.50 ml  12.27 ml/m LA Vol (A4C): 60.7 ml 27.09 ml/m  AORTIC VALVE LVOT Vmax:   58.00 cm/s LVOT Vmean:  41.800 cm/s LVOT VTI:    0.095 m  AORTA Ao Root diam: 3.50 cm MITRAL VALVE MV Area (PHT): 2.09 cm    SHUNTS MV Decel Time: 363 msec    Systemic VTI:  0.10 m MR Peak grad: 42.5 mmHg    Systemic Diam: 2.10 cm MR Mean grad: 29.0 mmHg MR Vmax:      326.00 cm/s MR Vmean:     255.0 cm/s MV E velocity: 68.00 cm/s MV A velocity: 24.60 cm/s MV E/A ratio:  2.76 Vishnu Priya Mallipeddi Electronically signed by Diannah Late Mallipeddi Signature Date/Time: 02/23/2024/1:31:37 PM    Final    CUP PACEART INCLINIC DEVICE CHECK Result Date: 02/18/2024 Industry device check.Randall Sharps, BSN, RN  CARDIAC CATHETERIZATION Result Date: 02/11/2024 1. Normal RA pressure and mildly elevated PCWP. 2. Mild pulmonary venous hypertension. 3. Low cardiac output, CI 2.02 by Fick. In future, would use femoral vein access for RHC if thermodilution needed.   CUP PACEART REMOTE DEVICE CHECK Result Date: 02/09/2024 ICD Scheduled remote reviewed. Normal device function.  Presenting rhythm: AS/VS Brief ATR's c/w FFOS Next remote transmission per protocol. LA, CVRS   Microbiology: No results found for this or any previous visit (from the past 240 hours).   Labs: Basic Metabolic Panel: Recent Labs  Lab 03/02/24 0349 03/03/24 0231 03/04/24 0318 03/05/24 0155 03/06/24 0316  NA 138 137 137 139 141  K 4.8 4.5 4.7 5.2* 5.2*  CL 97* 99 102 105 106  CO2 26 28 24 25 27   GLUCOSE 96 88 76 77 103*  BUN 45* 54* 44* 30* 28*  CREATININE 1.77*  1.82* 1.61* 1.29* 1.37*  CALCIUM   9.7 8.8* 9.2 9.0 9.2  MG 3.0*  --   --   --   --    Liver Function Tests: No results for input(s): AST, ALT, ALKPHOS, BILITOT, PROT, ALBUMIN  in the last 168 hours. No results for input(s): LIPASE, AMYLASE in the last 168 hours. No results for input(s): AMMONIA in the last 168 hours. CBC: Recent Labs  Lab 03/01/24 0317 03/02/24 0349 03/03/24 0231 03/04/24 0318 03/05/24 0155  WBC 11.9* 9.8 5.7 5.5 5.5  HGB 15.3 15.5 14.7 13.3 13.2  HCT 47.8 49.2 46.5 41.2 40.3  MCV 95.2 94.3 94.9 93.0 92.9  PLT 294 365 298 266 275   Cardiac Enzymes: No results for input(s): CKTOTAL, CKMB, CKMBINDEX, TROPONINI in the last 168 hours. BNP: BNP (last 3 results) Recent Labs    04/27/23 0945 06/08/23 1040 01/31/24 0943  BNP 140.7* 135.4* 392.9*    ProBNP (last 3 results) Recent Labs    02/23/24 1323  PROBNP 668.0*    CBG: No results for input(s): GLUCAP in the last 168 hours.     Signed:  Sigurd Pac MD.  [facility] 03/06/2024, 1:32 PM        [1]  Allergies Allergen Reactions   Codeine Itching

## 2024-03-06 NOTE — Progress Notes (Signed)
 Patient ID: Wyatt Donovan, male   DOB: 1985/09/30, 39 y.o.   MRN: [id number]     Advanced Heart Failure Rounding Note  Cardiologist: Ezra Shuck, MD      Scr 1.37 K 5.2 (unchanged from yesterday)  Feeling well. No recurrent GI symptoms.  Objective:   Weight Range: 97.2 kg Body mass index is 28.27 kg/m.   Vital Signs:   Temp:  [97.7 F (36.5 C)-98.3 F (36.8 C)] 98.1 F (36.7 C) (01/05 0300) Pulse Rate:  [60-68] 68 (01/05 0300) Resp:  [12-18] 18 (01/05 0300) BP: (90-116)/(57-76) 113/76 (01/05 0300) SpO2:  [93 %-98 %] 97 % (01/05 0300) Weight:  [97.2 kg] 97.2 kg (01/05 0300) Last BM Date : 03/04/24  Weight change: Filed Weights   03/04/24 0416 03/05/24 0420 03/06/24 0300  Weight: 97.3 kg 97.1 kg 97.2 kg    Intake/Output:   Intake/Output Summary (Last 24 hours) at 03/06/2024 0715 Last data filed at 03/06/2024 0300 Gross per 24 hour  Intake 840 ml  Output 1300 ml  Net -460 ml     Physical Exam  General:  Lying comfortably in bed. Neck: No JVD  Cor: Regular rate & rhythm. No murmurs. Lungs: clear Abdomen: soft, nontender, nondistended. Extremities: no edema Neuro: alert & orientedx3. Affect pleasant   Telemetry   V paced 60s  Labs   CBC Recent Labs    03/04/24 0318 03/05/24 0155  WBC 5.5 5.5  HGB 13.3 13.2  HCT 41.2 40.3  MCV 93.0 92.9  PLT 266 275   Basic Metabolic Panel Recent Labs    98/95/73 0155 03/06/24 0316  NA 139 141  K 5.2* 5.2*  CL 105 106  CO2 25 27  GLUCOSE 77 103*  BUN 30* 28*  CREATININE 1.29* 1.37*  CALCIUM  9.0 9.2   Liver Function Tests No results for input(s): AST, ALT, ALKPHOS, BILITOT, PROT, ALBUMIN  in the last 72 hours. No results for input(s): LIPASE, AMYLASE in the last 72 hours. Cardiac Enzymes No results for input(s): CKTOTAL, CKMB, CKMBINDEX, TROPONINI in the last 72 hours.  BNP: BNP (last 3 results) Recent Labs    04/27/23 0945 06/08/23 1040 01/31/24 0943  BNP 140.7*  135.4* 392.9*    ProBNP (last 3 results) Recent Labs    02/23/24 1323  PROBNP 668.0*     D-Dimer No results for input(s): DDIMER in the last 72 hours. Hemoglobin A1C No results for input(s): HGBA1C in the last 72 hours. Fasting Lipid Panel No results for input(s): CHOL, HDL, LDLCALC, TRIG, CHOLHDL, LDLDIRECT in the last 72 hours. Medications:   Scheduled Medications:  apixaban   5 mg Oral BID   atorvastatin   80 mg Oral Daily   bisacodyl   10 mg Rectal Once   carvedilol   3.125 mg Oral BID WC   dapagliflozin  propanediol  10 mg Oral Daily   furosemide   20 mg Oral Daily   mexiletine  200 mg Oral BID   pantoprazole   40 mg Oral BID AC   ranolazine   500 mg Oral BID   senna  1 tablet Oral Daily   sodium chloride  flush  3 mL Intravenous Q12H    Infusions:   PRN Medications: acetaminophen , albuterol , docusate sodium , nitroGLYCERIN , ondansetron  (ZOFRAN ) IV, sodium chloride  flush  Assessment/Plan  1. 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  - Ranolazine  was added - Continue Coreg  3.125 bid.   2. CAD: Early onset CAD s/p MI with LAD  PCI in 2012,followed by CABG  x 4 4/22  Admit 10/22 with NSTEMI, cath showed patent LIMA to LAD, all other grafts occluded. S/p PCI DES native mid LCx 99% stenosis. Repeat 01/2023 LHC showed coronary anatomy unchanged; grafts all occluded (known prior) except LIMA-LAD.  Occluded native RCA (known from prior) with patent mid LCx stent.  He had constant chest pain for > 1 week with minimally elevated troponin/no trend.  No ACS suspected.  Pain has now completely resolved.  - He is now off Plavix  (stopped 8/23 due to BRBPR). - He is on Eliquis  so no ASA.   - Continue atorvastatin  80 mg daily.  - Continue ranolazine .  - Off Imdur  with Coreg .   3. Acute on chronic systolic CHF: Ischemic cardiomyopathy.  [employer] ICD.  Cardiac MRI in 10/22 showed LVEF 29% and RVEF  45%.  Patient has a cardiac contractility modulator.  Echo 12/24 showed EF 30-35%, global hypokinesis, mild RV dysfunction, IVC not dilated.  GDMT has been limited by hypotension & hyperkalemia, K is now controlled with Lokelma .  Had RHC 12/24 showing normal filling pressures, low cardiac output with CI 2 by Fick, 1.82 by thermodilution. RHC in 12/25 with normal filling pressures but CI 2.02. Echo this admission with EF 20-25%, RV low normal function.   - GDMT was stopped due to hyperkalemia, but had been off his home Lokelma  for uncertain reasons when he developed hyperkalemia. Meds initially restarted; however, with rising creatinine and poor po intake/obstipation, had to cut back again.  - Volume okay, continue lasix  20 mg po daily (prior home dose) - Digoxin  stopped d/t poor po intake and elevated level. Hold off for now, can reassess at follow-up. - Continue Coreg  3.125 mg bid for now.  - Continue Farxiga  10 mg daily for now.  - He will stay off spironolactone  and Entresto  for now. Restarted lokelma  5 g daily. Can add back pending labs at f/u visit - Nearing end stage ischemic cardiomyopathy.  Has been referred to [facility] for transplant evaluation.  He would be a LVAD candidate if needed.   4. 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  No thrombus on echo 08/22. No thrombus on cMRI and echo - Continue Eliquis  5 mg bid.   5. Hyperkalemia: He has had a tendency towards hyperkalemia and has been on Lokelma  in order to stay on spironolactone  and Entresto .   - Lokelma  restarted over the weekend, K 5.2 this am. Continue 5g daily. He should continue taking at discharge.  6. Atrial flutter/fibrillation: Both arrhythmias noted in 10/22.  He tolerated poorly with worsened symptoms.  He had DCCV in 10/22. Previously on amiodarone  but later stopped. He is in NSR today.  - Continue Eliquis .  - Would favor ablation if he has recurrent AF.   7. GI: He had developed abdominal pain and distention with poor po intake as  well as nausea/vomiting.  KUB suggestive of SBO but CT abdomen benign. Had multiple bowel movements with resolution of symptoms.  8. AKI: Creatinine up to 1.82 due to poor po intake.  - Scr improved to 1.37 today - Back on low dose lasix   Okay for discharge today from HF standpoint.  Has close follow-up in clinic.  HF medications at discharge Apixaban  5 BID Atorvastatin  80 Coreg  3.125 mg BID Farxiga  10 mg daily Lasix  20 mg daily Mexiletine 200 mg BID Ranexa  500 mg BID Lokelma  5 g daily  Keep off spironolactone , entresto  and digoxin  at discharge.   Zakia Sainato N  03/06/2024 7:15 AM

## 2024-03-06 NOTE — Progress Notes (Signed)
 " PROGRESS NOTE    Wyatt Donovan  FMW:[id number] DOB: [birth year] DOA: 02/23/2024 PCP: Orpha Yancey LABOR, MD   38/M w HFrEF (EF 30-35%) status post AICD, CAD s/p PCI followed by CABG (LIMA to LAD patent, remaining grafts occluded), paroxysmal atrial fibrillation status post DCCV 2022, intermittent rectal bleeding, LV thrombus, hyperlipidemia, history of tobacco use in remission, and frequent PVCs presenting with worsening dyspnea approximately 3 to 4 days  -echo shows depressed EF at 20 to 25% with global hypokinesia and low normal RVEF  -complicated by constipation/hemorrhoids etc - 12/31, refused enema and suppository -1/1, large BM after enema - 1/2, multiple BMs after sorbitol , BP dropped to the 80s, given his 500 mL LR bolus   Subjective: - Feels better overall, had another BM, tolerating diet, denies any dyspnea, K still 5.2  Assessment and Plan:  Acute on chronic systolic CHF - Known ischemic cardiomyopathy - Echo 12/24 noted EF of 30-35% mildly reduced RV - CHF team following, echo now noted EF of 20-25% with low normal RV - Diuresed with IV Lasix  this admission, CHF team following, GDMT limited by recurrent hyperkalemia - Then complicated by AKI in the setting of GI issues, diuretics and GDMT on hold - Creatinine now improving, continue Coreg  and Farxiga , restarted oral Lasix  yesterday - Discharge planning  Recurrent hyperkalemia - Now off Aldactone  and Entresto  - Continue daily Lokelma , resumed Lasix  yesterday encourage  Constipation, abdominal distention nausea and vomiting -Resolved - Moderate stool burden on x-ray, CTA overnight was unremarkable - was on narcotics initially - Multiple BMs in the last 72 hours following enema and sorbitol  - Nightly Senokot, tolerating diet now  PVCs On mexiletine, continue Coreg  - Started on Ranexa  this admission  CAD - Prior PCI to LAD in 2012, CABG 4/22 - Cath 10/22 noted patent LIMA to LAD, all other grafts occluded, status  post PCI to native left circumflex 99% stenosis - Repeat cath 12/24 grafts all occluded except LIMA to LAD - Now on Eliquis , statin, Coreg   History of LV thrombus - Not noted on recent echoes, initially seen on cardiac MRI 5/22 Continue Eliquis   Atrial flutter/fibrillation - Now in sinus rhythm, continue Eliquis , Coreg      DVT prophylaxis: Eliquis  Code Status: Full code Family Communication: None present Disposition Plan: home later today or in a.m.  Consultants:    Procedures:   Antimicrobials:    Objective: Vitals:   03/05/24 1955 03/05/24 2300 03/06/24 0300 03/06/24 0728  BP: 90/64 105/69 113/76 110/74  Pulse:  67 68 68  Resp:  18 18 16   Temp:  98.3 F (36.8 C) 98.1 F (36.7 C) 97.9 F (36.6 C)  TempSrc:  Oral Oral Oral  SpO2: 93% 98% 97% 98%  Weight:   97.2 kg   Height:   6' 1 (1.854 m)     Intake/Output Summary (Last 24 hours) at 03/06/2024 0940 Last data filed at 03/06/2024 0300 Gross per 24 hour  Intake 720 ml  Output 1000 ml  Net -280 ml   Filed Weights   03/04/24 0416 03/05/24 0420 03/06/24 0300  Weight: 97.3 kg 97.1 kg 97.2 kg    Examination:  General exam: Chronically ill, AO x 3, no distress Respiratory system: Clear Cardiovascular system: S1 & S2 heard, RRR.  Abd: Soft, less distended, nontender, bowel sounds present Central nervous system: Alert and oriented. No focal neurological deficits. Extremities: no edema Skin: No rashes Psychiatry: Flat affect   Data Reviewed:   CBC: Recent Labs  Lab 03/01/24  [id number] 03/02/24 0349 03/03/24 0231 03/04/24 0318 03/05/24 0155  WBC 11.9* 9.8 5.7 5.5 5.5  HGB 15.3 15.5 14.7 13.3 13.2  HCT 47.8 49.2 46.5 41.2 40.3  MCV 95.2 94.3 94.9 93.0 92.9  PLT 294 365 298 266 275   Basic Metabolic Panel: Recent Labs  Lab 03/02/24 0349 03/03/24 0231 03/04/24 0318 03/05/24 0155 03/06/24 0316  NA 138 137 137 139 141  K 4.8 4.5 4.7 5.2* 5.2*  CL 97* 99 102 105 106  CO2 26 28 24 25 27   GLUCOSE 96  88 76 77 103*  BUN 45* 54* 44* 30* 28*  CREATININE 1.77* 1.82* 1.61* 1.29* 1.37*  CALCIUM  9.7 8.8* 9.2 9.0 9.2  MG 3.0*  --   --   --   --    GFR: Estimated Creatinine Clearance: 88 mL/min (A) (by C-G formula based on SCr of 1.37 mg/dL (H)). Liver Function Tests: No results for input(s): AST, ALT, ALKPHOS, BILITOT, PROT, ALBUMIN  in the last 168 hours.  No results for input(s): LIPASE, AMYLASE in the last 168 hours. No results for input(s): AMMONIA in the last 168 hours. Coagulation Profile: No results for input(s): INR, PROTIME in the last 168 hours. Cardiac Enzymes: No results for input(s): CKTOTAL, CKMB, CKMBINDEX, TROPONINI in the last 168 hours. BNP (last 3 results) Recent Labs    02/23/24 1323  PROBNP 668.0*   HbA1C: No results for input(s): HGBA1C in the last 72 hours. CBG: No results for input(s): GLUCAP in the last 168 hours.  Lipid Profile: No results for input(s): CHOL, HDL, LDLCALC, TRIG, CHOLHDL, LDLDIRECT in the last 72 hours. Thyroid  Function Tests: No results for input(s): TSH, T4TOTAL, FREET4, T3FREE, THYROIDAB in the last 72 hours. Anemia Panel: No results for input(s): VITAMINB12, FOLATE, FERRITIN, TIBC, IRON, RETICCTPCT in the last 72 hours. Urine analysis:    Component Value Date/Time   COLORURINE YELLOW 02/18/2023 0105   APPEARANCEUR CLEAR 02/18/2023 0105   LABSPEC 1.024 02/18/2023 0105   PHURINE 5.0 02/18/2023 0105   GLUCOSEU >=500 (A) 02/18/2023 0105   HGBUR NEGATIVE 02/18/2023 0105   BILIRUBINUR NEGATIVE 02/18/2023 0105   KETONESUR NEGATIVE 02/18/2023 0105   PROTEINUR NEGATIVE 02/18/2023 0105   NITRITE NEGATIVE 02/18/2023 0105   LEUKOCYTESUR NEGATIVE 02/18/2023 0105   Sepsis Labs: @LABRCNTIP (procalcitonin:4,lacticidven:4)  )No results found for this or any previous visit (from the past 240 hours).   Radiology Studies: No results found.    Scheduled Meds:  apixaban   5  mg Oral BID   atorvastatin   80 mg Oral Daily   bisacodyl   10 mg Rectal Once   carvedilol   3.125 mg Oral BID WC   dapagliflozin  propanediol  10 mg Oral Daily   furosemide   20 mg Oral Daily   mexiletine  200 mg Oral BID   pantoprazole   40 mg Oral BID AC   ranolazine   500 mg Oral BID   senna  1 tablet Oral Daily   sodium chloride  flush  3 mL Intravenous Q12H   sodium zirconium cyclosilicate   5 g Oral Once   Continuous Infusions:   LOS: 12 days    Time spent:    Sigurd Pac, MD [facility]   03/06/2024, 9:40 AM    "

## 2024-03-07 NOTE — Progress Notes (Signed)
 "  Advanced Heart Failure Clinic Note   Primary Care: Orpha Yancey LABOR, MD HF Cardiologist: Dr. Rolan  Chief complaint: CHF  HPI: Patient is a 39 y.o. with history of early onset CAD s/p CABG, ischemic cardiomyopathy, and LV thrombus. Patient had initial MI in 2012 at age 27, PCI to LAD.  He had inferoposterior MI in 4/22.  LHC showed 3 vessel disease, and patient had CABG x 4. Cardiac MRI in 5/22 showed LV EF 27% with LV thrombus, there was significant viability.  Post-op, patient had GI bleeding and anticoagulation was stopped.  He quit smoking after CABG.    Follow up 10/04/20 carvedilol  decreased due to dizziness and repeat echo arranged.  Echo 8/22 EF 25-30%. He was referred to EP for ICD consideration => [employer] ICD.   Admitted 12/03/20 with NSTEMI. Coronary angiogram showed patent LIMA to LAD with occlusion of all other grafts. S/p PCI/DES to native mid LCX 99% stenosis.  Native LAD and RCA occluded. Angina resolved post PCI. Farxiga  and Coreg  added back, however spiro and losartan  held due to hyperkalemia during this admission. His hospitalization was complicated by atrial fibrillation and flutter with RVR. He received IV amiodarone  and started on Eliquis .  He underwent DCCV with conversion to NSR on 12/06/20. Discharged home 12/06/20, weight 177.5 lbs.  In 2/24, patient had cardiac contracility modulator placement.   Patient presented to [facility] in 12/24 with 2-3 months of RUQ pain, worse x 2-3 weeks.  Abdominal CT showed a stone in the cystic duct with mildly distended gallbladder but no evidence for cholecystitis.  He was discharged from the ER but told that he will need cholecystectomy.   Echo 01/2023 showed EF 30-35%, global hypokinesis, mild RV dysfunction, IVC not dilated.   Underwent cath 01/2023 showing normal filling pressures, low cardiac output with CI 2 by Fick,1.82 thermodilution. Coronary anatomy was unchanged.  Grafts all occluded (known prior)  except LIMA-LAD.  Occluded native RCA (known from prior).  Patent mid LCx stent. Post cath he was started on digoxin  0.125 mg.   He was admitted in 1/25 with RUL PNA and treated with antibiotics.  He continued to have abdominal pain, but this time abdominal US  showed no gallstones and HIDA scan was negative.   Admitted 12/25 with A/C HFrEF. Diuresed well with IV lasix .  Admission complicated by constipation/hemorrhoids. Resolved with sorbitol , actually got hypotensive after multiple BMs requiring IVF. GDMT limited by renal function and hyperkalemia.  Today he returns for post hospital follow up. Overall feeling ok just having GI upset again. Denies palpitations, edema, or PND/Orthopnea. Intt dizziness. Had a little CP yesterday with ambulation, resolved with rest. SOB with exertion. Appetite ok, avoids salty foods. Drinks <2 L fluid a day. No fever or chills. Weight at home 220 pounds. Taking all medications. Denies ETOH, tobacco or drug use. Recently got a call from [facility], needs to see dentist first (scheduled 04/03/24) then will follow up with [facility].   ECG (personally reviewed from 02/26/24): NSR 1st degree AVB  [employer] ICD (personally reviewed): HeartLogic 14, activity 0.6 hf/day, avg HR 67  PMH: 1. Hyperlipidemia 2. CAD: MI at age 41 in 2012, PCI to LAD.   - Inferoposterior MI in 4/22: LHC showed 3 vessel disease.  CABG with LIMA-LAD, radial to OM1, sequential SVG-PDA and D1.  - NSTEMI-->LHC (10/22): patent LIMA to LAD, all other grafts occluded. S/p PCI DES native mid LCx 99% stenosis, LAD and RCA totally occluded.  - LHC (12/24):  All SVGs occluded, LIMA-LAD patent, occluded native RCA, patent mid LCX stent.  3. Post-operative GI bleeding after CABG 4. Chronic systolic CHF: Ischemic cardiomyopathy.   - Cardiac MRI (5/22): LV EF 27%, RV EF 57%, apical thrombus, extensive viability.  - Echo (8/22) EF 20-25% - Echo (10/22): EF 35-40% - cMRI (10/22): LVEF 29%, RV EF 45%, no LV thrombus,  LGE suggestive of prior MI.  - [employer] ICD  - Cardiac contractility modulator implantation in 2/24.  - Echo (12/24): EF 30-35%, global hypokinesis, mild RV dysfunction, IVC not dilated. - RHC (12/24): mean RA 6, PA 28/13, mean PCWP 13, CI 1.82 thermo/2.0 Fick 5. LV thrombus  - No thrombus on 10/22 cMRI.  6. Prior smoker: Quit 5/22.  7. H/o hyperkalemia 8. Atrial fibrillation/flutter: Paroxysmal.  - DCCV 10/22 9. ABIs (5/22): Normal.  10. Colonic polyps 11. Symptomatic cholelithiasis 12. PAD: Peripheral arterial dopplers (5/25) showed only mild bilateral plaque.  13. Barrett's esophagus  Current Outpatient Medications  Medication Sig Dispense Refill   acetaminophen  (TYLENOL ) 500 MG tablet Take 500 mg by mouth every 6 (six) hours as needed for moderate pain (pain score 4-6) or headache.     albuterol  (VENTOLIN  HFA) 108 (90 Base) MCG/ACT inhaler Inhale 2 puffs into the lungs 3 (three) times a week. for wheezing     apixaban  (ELIQUIS ) 5 MG TABS tablet Take 1 tablet (5 mg total) by mouth 2 (two) times daily. 60 tablet 6   atorvastatin  (LIPITOR ) 80 MG tablet Take 1 tablet (80 mg total) by mouth daily. 90 tablet 3   carvedilol  (COREG ) 3.125 MG tablet Take 1 tablet (3.125 mg total) by mouth 2 (two) times daily with a meal. 60 tablet 3   dapagliflozin  propanediol (FARXIGA ) 10 MG TABS tablet Take 1 tablet (10 mg total) by mouth daily before breakfast. 30 tablet 11   furosemide  (LASIX ) 20 MG tablet Take 1 tablet (20 mg total) by mouth daily. 90 tablet 3   mexiletine (MEXITIL ) 200 MG capsule Take 1 capsule (200 mg total) by mouth 2 (two) times daily. 60 capsule 6   Omega-3 Fatty Acids (OMEGA 3 500 PO) Take 500 mg by mouth daily.     polyethylene glycol (MIRALAX  / GLYCOLAX ) 17 g packet Take 17 g by mouth daily as needed for mild constipation or moderate constipation.     ranolazine  (RANEXA ) 500 MG 12 hr tablet Take 1 tablet (500 mg total) by mouth 2 (two) times daily. 60 tablet 3   senna  (SENOKOT) 8.6 MG TABS tablet Take 1 tablet (8.6 mg total) by mouth daily. 120 tablet 0   sodium zirconium cyclosilicate  (LOKELMA ) 5 g packet Mix packet as directed and take 5 g by mouth daily. 30 packet 6   No current facility-administered medications for this encounter.   Allergies  Allergen Reactions   Codeine Itching   Social History   Socioeconomic History   Marital status: Single    Spouse name: Not on file   Number of children: Not on file   Years of education: Not on file   Highest education level: High school graduate  Occupational History   Occupation: Not actively working    Comment: applied for disability x 2.   Tobacco Use   Smoking status: Former    Current packs/day: 0.00    Average packs/day: 1 pack/day for 22.0 years (22.0 ttl pk-yrs)    Types: Cigarettes, Cigars    Start date: 06/29/1998    Quit date: 06/28/2020  Years since quitting: 3.7   Smokeless tobacco: Never   Tobacco comments:    cigarettes stopped in 2020, [employer]ets started in 2018-04-05/day  Vaping Use   Vaping status: Never Used  Substance and Sexual Activity   Alcohol use: Never   Drug use: Not Currently    Types: Marijuana   Sexual activity: Never  Other Topics Concern   Not on file  Social History Narrative   Single   Lives with his parents   Trying to get his GED   DeGent date all cultures   Social Drivers of Health   Tobacco Use: Medium Risk (03/10/2024)   Patient History    Smoking Tobacco Use: Former    Smokeless Tobacco Use: Never    Passive Exposure: Not on Actuary Strain: Not on file  Food Insecurity: No Food Insecurity (02/23/2024)   Epic    Worried About Programme Researcher, Broadcasting/film/video in the Last Year: Never true    Ran Out of Food in the Last Year: Never true  Transportation Needs: No Transportation Needs (02/23/2024)   Epic    Lack of Transportation (Medical): No    Lack of Transportation (Non-Medical): No  Physical Activity: Not on file  Stress: Not on  file  Social Connections: Not on file  Intimate Partner Violence: Not At Risk (02/23/2024)   Epic    Fear of Current or Ex-Partner: No    Emotionally Abused: No    Physically Abused: No    Sexually Abused: No  Depression (PHQ2-9): Medium Risk (04/14/2021)   Depression (PHQ2-9)    PHQ-2 Score: 6  Alcohol Screen: Not on file  Housing: Low Risk (02/23/2024)   Epic    Unable to Pay for Housing in the Last Year: No    Number of Times Moved in the Last Year: 0    Homeless in the Last Year: No  Utilities: Not At Risk (02/23/2024)   Epic    Threatened with loss of utilities: No  Health Literacy: Not on file   Family History  Problem Relation Age of Onset   Colon cancer Father 46   Colon cancer Paternal Grandfather    Colon cancer Paternal Aunt    Hypertension Neg Hx    Diabetes Neg Hx    Coronary artery disease Neg Hx    Esophageal cancer Neg Hx    Rectal cancer Neg Hx    Stomach cancer Neg Hx    BP 110/78   Pulse 76   Ht 6' 1 (1.854 m)   Wt 99.8 kg (220 lb)   SpO2 97%   BMI 29.03 kg/m   Wt Readings from Last 3 Encounters:  03/10/24 99.8 kg (220 lb)  03/06/24 97.2 kg (214 lb 4.6 oz)  02/11/24 99.8 kg (220 lb)   PHYSICAL EXAM: General:  well appearing.  No respiratory difficulty. Walked into clinic with cane Neck: JVD flat.  Cor: Regular rate & rhythm. No murmurs. Lungs: clear Abd: soft to touch, hypoactive bowel sounds Extremities: no edema   Neuro: alert & oriented x 3. Affect pleasant.   ASSESSMENT & PLAN:  1. PVCs: 22% on Zio in 12/25   - Now on mexiletine, will need repeat quantification with repeat Zio monitor. Will order today.  - If PVC burden not improved will need switch to amiodarone .  - Ranolazine  was added - Continue Coreg  3.125 bid.    2. CAD: Early onset CAD s/p MI with LAD PCI in 2012,followed by CABG  x  4 4/22  Admit 10/22 with NSTEMI, cath showed patent LIMA to LAD, all other grafts occluded. S/p PCI DES native mid LCx 99% stenosis. Repeat  01/2023 LHC showed coronary anatomy unchanged; grafts all occluded (known prior) except LIMA-LAD.  Occluded native RCA (known from prior) with patent mid LCx stent.  He had constant chest pain for > 1 week with minimally elevated troponin/no trend.  No ACS suspected.   - Short episode of chest pain yesterday with activity. Resolved with rest.  - He is now off Plavix  (stopped 8/23 due to BRBPR). - He is on Eliquis  so no ASA.   - Continue atorvastatin  80 mg daily.  - Continue ranolazine , feels like this has helped a lot.  - Off Imdur  with Coreg .    3. Chronic systolic CHF: Ischemic cardiomyopathy.  [employer] ICD.  Cardiac MRI in 10/22 showed LVEF 29% and RVEF 45%.  Patient has a cardiac contractility modulator.  Echo 12/24 showed EF 30-35%, global hypokinesis, mild RV dysfunction, IVC not dilated.  GDMT has been limited by hypotension & hyperkalemia, K is now controlled with Lokelma .  Had RHC 12/24 showing normal filling pressures, low cardiac output with CI 2 by Fick, 1.82 by thermodilution. RHC in 12/25 with normal filling pressures but CI 2.02. Echo 12/25 with EF 20-25%, RV low normal function.   - Volume okay but Heartlogic score slightly elevated. Will have him take 40 mg lasix  x2 days then can  continue lasix  20 mg po daily - Digoxin  stopped d/t poor po intake and elevated level. Labs today.  - Continue Coreg  3.125 mg bid for now.  - Continue Farxiga  10 mg daily for now.  - He will stay off spironolactone  and Entresto  for now. Restarted lokelma  5 g daily. Can add back pending labs today - Nearing end stage ischemic cardiomyopathy.  Has been referred to [facility] for transplant evaluation.  He would be a LVAD candidate if needed. Received recent call from [facility] stating he needed to see Dentist first. Has f/u with Dentist 04/03/24 then will call back to follow up with [facility].    4. 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  No thrombus on echo 08/22. No thrombus on cMRI and echo - Continue Eliquis  5 mg bid.  Denies abnormal bleeding, last CBC stable.    5. Hyperkalemia: He has had a tendency towards hyperkalemia and has been on Lokelma  in order to stay on spironolactone  and Entresto .   - Back on Lokelma . K last 5.2. Continue 5g daily. Labs today.    6. Atrial flutter/fibrillation: Both arrhythmias noted in 10/22.  He tolerated poorly with worsened symptoms.  He had DCCV in 10/22. Previously on amiodarone  but later stopped.  - Continue Eliquis .  - Would favor ablation if he has recurrent AF.   7. Constipation - Ongoing issue - Last BM 2 days ago. Taking miralax  and senokot regularly - Asked him to try dulcolax OTC. Required Enema and Sorbitol  inpatient.  - Plans to schedule with GI soon   Follow up in 1 month with APP.    Wyatt Donovan  03/10/2024 "

## 2024-03-09 ENCOUNTER — Telehealth (HOSPITAL_COMMUNITY): Payer: Self-pay

## 2024-03-09 NOTE — Telephone Encounter (Signed)
 Called to confirm/remind patient of their appointment at the [facility] Clinic on 03/11/23 9:00.   Appointment:   [x] Confirmed  [] Left mess   [] No answer/No voice mail  [] VM Full/unable to leave message  [] Phone not in service  Patient reminded to bring all medications and/or complete list.  Confirmed patient has transportation. Gave directions, instructed to utilize valet parking.

## 2024-03-10 ENCOUNTER — Encounter (HOSPITAL_COMMUNITY): Payer: Self-pay | Admitting: Cardiology

## 2024-03-10 ENCOUNTER — Ambulatory Visit (HOSPITAL_COMMUNITY): Admit: 2024-03-10 | Discharge: 2024-03-10 | Disposition: A | Attending: Internal Medicine

## 2024-03-10 ENCOUNTER — Other Ambulatory Visit (HOSPITAL_COMMUNITY): Payer: Self-pay | Admitting: Cardiology

## 2024-03-10 ENCOUNTER — Ambulatory Visit (HOSPITAL_COMMUNITY)
Admission: RE | Admit: 2024-03-10 | Discharge: 2024-03-10 | Disposition: A | Source: Ambulatory Visit | Attending: Cardiology | Admitting: Cardiology

## 2024-03-10 ENCOUNTER — Encounter (HOSPITAL_COMMUNITY): Payer: Self-pay

## 2024-03-10 ENCOUNTER — Ambulatory Visit (HOSPITAL_COMMUNITY): Payer: Self-pay | Admitting: Internal Medicine

## 2024-03-10 VITALS — BP 110/78 | HR 76 | Ht 73.0 in | Wt 220.0 lb

## 2024-03-10 DIAGNOSIS — I4892 Unspecified atrial flutter: Secondary | ICD-10-CM | POA: Diagnosis not present

## 2024-03-10 DIAGNOSIS — Z951 Presence of aortocoronary bypass graft: Secondary | ICD-10-CM | POA: Insufficient documentation

## 2024-03-10 DIAGNOSIS — I2510 Atherosclerotic heart disease of native coronary artery without angina pectoris: Secondary | ICD-10-CM | POA: Diagnosis not present

## 2024-03-10 DIAGNOSIS — Z7984 Long term (current) use of oral hypoglycemic drugs: Secondary | ICD-10-CM | POA: Insufficient documentation

## 2024-03-10 DIAGNOSIS — I5022 Chronic systolic (congestive) heart failure: Secondary | ICD-10-CM | POA: Insufficient documentation

## 2024-03-10 DIAGNOSIS — Z79899 Other long term (current) drug therapy: Secondary | ICD-10-CM | POA: Insufficient documentation

## 2024-03-10 DIAGNOSIS — E875 Hyperkalemia: Secondary | ICD-10-CM | POA: Diagnosis not present

## 2024-03-10 DIAGNOSIS — I493 Ventricular premature depolarization: Secondary | ICD-10-CM | POA: Insufficient documentation

## 2024-03-10 DIAGNOSIS — I513 Intracardiac thrombosis, not elsewhere classified: Secondary | ICD-10-CM | POA: Diagnosis not present

## 2024-03-10 DIAGNOSIS — Z955 Presence of coronary angioplasty implant and graft: Secondary | ICD-10-CM | POA: Diagnosis not present

## 2024-03-10 DIAGNOSIS — I255 Ischemic cardiomyopathy: Secondary | ICD-10-CM | POA: Insufficient documentation

## 2024-03-10 DIAGNOSIS — K5900 Constipation, unspecified: Secondary | ICD-10-CM | POA: Insufficient documentation

## 2024-03-10 DIAGNOSIS — I252 Old myocardial infarction: Secondary | ICD-10-CM | POA: Diagnosis not present

## 2024-03-10 DIAGNOSIS — Z95810 Presence of automatic (implantable) cardiac defibrillator: Secondary | ICD-10-CM | POA: Insufficient documentation

## 2024-03-10 DIAGNOSIS — Z87891 Personal history of nicotine dependence: Secondary | ICD-10-CM | POA: Insufficient documentation

## 2024-03-10 DIAGNOSIS — I480 Paroxysmal atrial fibrillation: Secondary | ICD-10-CM | POA: Insufficient documentation

## 2024-03-10 DIAGNOSIS — Z7901 Long term (current) use of anticoagulants: Secondary | ICD-10-CM | POA: Insufficient documentation

## 2024-03-10 LAB — PRO BRAIN NATRIURETIC PEPTIDE: Pro Brain Natriuretic Peptide: 820 pg/mL — ABNORMAL HIGH

## 2024-03-10 LAB — BASIC METABOLIC PANEL WITH GFR
Anion gap: 13 (ref 5–15)
BUN: 18 mg/dL (ref 6–20)
CO2: 22 mmol/L (ref 22–32)
Calcium: 9.9 mg/dL (ref 8.9–10.3)
Chloride: 110 mmol/L (ref 98–111)
Creatinine, Ser: 1.32 mg/dL — ABNORMAL HIGH (ref 0.61–1.24)
GFR, Estimated: >60 mL/min
Glucose, Bld: 113 mg/dL — ABNORMAL HIGH (ref 70–99)
Potassium: 4.4 mmol/L (ref 3.5–5.1)
Sodium: 145 mmol/L (ref 135–145)

## 2024-03-10 NOTE — Progress Notes (Signed)
 Zio patch placed onto patient.  All instructions and information reviewed with patient, they verbalize understanding with no questions.

## 2024-03-10 NOTE — Patient Instructions (Signed)
 CHANGE Lasix  to 40 mg daily for 2 days, then go back to 20 mg daily.  Labs done today, your results will be available in MyChart, we will contact you for abnormal readings.  Your provider has recommended that  you wear a Zio Patch for 7 days.  This monitor will record your heart rhythm for our review.  IF you have any symptoms while wearing the monitor please press the button.  If you have any issues with the patch or you notice a red or orange light on it please call the company at [phone].  Once you remove the patch please mail it back to the company as soon as possible so we can get the results.  Your physician recommends that you schedule a follow-up appointment in: 1 month.  If you have any questions or concerns before your next appointment please send us  a message through [place] or call our office at [phone].    TO LEAVE A MESSAGE FOR THE NURSE SELECT OPTION 2, PLEASE LEAVE A MESSAGE INCLUDING: YOUR NAME DATE OF BIRTH CALL BACK NUMBER REASON FOR CALL**this is important as we prioritize the call backs  YOU WILL RECEIVE A CALL BACK THE SAME DAY AS LONG AS YOU CALL BEFORE 4:00 PM  At the [facility], you and your health needs are our priority. As part of our continuing mission to provide you with exceptional heart care, we have created designated Provider Care Teams. These Care Teams include your primary Cardiologist (physician) and Advanced Practice Providers (APPs- Physician Assistants and Nurse Practitioners) who all work together to provide you with the care you need, when you need it.   You may see any of the following providers on your designated Care Team at your next follow up: Dr Toribio Fuel Dr Ezra Shuck Dr. Morene Brownie Greig Mosses, NP Caffie Shed, [place] [facility] [employer], [place] Beckey Coe, NP Jordan Lee, NP Ellouise Class, NP Tinnie Redman, PharmD Jaun Bash, PharmD   Please be sure to bring in all your medications  bottles to every appointment.    Thank you for choosing [facility]

## 2024-03-13 MED ORDER — SPIRONOLACTONE 25 MG PO TABS: 12.5000 mg | ORAL_TABLET | Freq: Every day | ORAL | Status: AC

## 2024-03-20 ENCOUNTER — Encounter: Payer: Self-pay | Admitting: Internal Medicine

## 2024-03-20 ENCOUNTER — Other Ambulatory Visit (HOSPITAL_COMMUNITY): Payer: Self-pay | Admitting: Cardiology

## 2024-03-22 LAB — BASIC METABOLIC PANEL WITH GFR
BUN/Creatinine Ratio: 10 (ref 9–20)
BUN: 11 mg/dL (ref 6–24)
CO2: 20 mmol/L (ref 20–29)
Calcium: 10.3 mg/dL — ABNORMAL HIGH (ref 8.7–10.2)
Chloride: 107 mmol/L — ABNORMAL HIGH (ref 96–106)
Creatinine, Ser: 1.08 mg/dL (ref 0.76–1.27)
Glucose: 108 mg/dL — ABNORMAL HIGH (ref 70–99)
Potassium: 4.3 mmol/L (ref 3.5–5.2)
Sodium: 145 mmol/L — ABNORMAL HIGH (ref 134–144)
eGFR: 89 mL/min/1.73

## 2024-03-30 ENCOUNTER — Ambulatory Visit (HOSPITAL_COMMUNITY): Payer: Self-pay | Admitting: Cardiology

## 2024-04-03 NOTE — Telephone Encounter (Addendum)
 Pt aware, agreeable, and verbalized understanding   ----- Message from Ezra Shuck, MD sent at 03/30/2024 11:11 AM EST ----- PVCs improved, down to 5.5% of beats.

## 2024-04-05 NOTE — Progress Notes (Incomplete)
 "  Advanced Heart Failure Clinic Note   Primary Care: Orpha Yancey LABOR, MD HF Cardiologist: Dr. Rolan  Chief complaint: CHF  HPI: Patient is a 39 y.o. with history of early onset CAD s/p CABG, ischemic cardiomyopathy, and LV thrombus. Patient had initial MI in 2012 at age 66, PCI to LAD.  He had inferoposterior MI in 4/22.  LHC showed 3 vessel disease, and patient had CABG x 4. Cardiac MRI in 5/22 showed LV EF 27% with LV thrombus, there was significant viability.  Post-op, patient had GI bleeding and anticoagulation was stopped.  He quit smoking after CABG.    Follow up 10/04/20 carvedilol  decreased due to dizziness and repeat echo arranged.  Echo 8/22 EF 25-30%. He was referred to EP for ICD consideration => [employer] ICD.   Admitted 12/03/20 with NSTEMI. Coronary angiogram showed patent LIMA to LAD with occlusion of all other grafts. S/p PCI/DES to native mid LCX 99% stenosis.  Native LAD and RCA occluded. Angina resolved post PCI. Farxiga  and Coreg  added back, however spiro and losartan  held due to hyperkalemia during this admission. His hospitalization was complicated by atrial fibrillation and flutter with RVR. He received IV amiodarone  and started on Eliquis .  He underwent DCCV with conversion to NSR on 12/06/20. Discharged home 12/06/20, weight 177.5 lbs.  In 2/24, patient had cardiac contracility modulator placement.   Patient presented to [facility] in 12/24 with 2-3 months of RUQ pain, worse x 2-3 weeks.  Abdominal CT showed a stone in the cystic duct with mildly distended gallbladder but no evidence for cholecystitis.  He was discharged from the ER but told that he will need cholecystectomy.   Echo 01/2023 showed EF 30-35%, global hypokinesis, mild RV dysfunction, IVC not dilated.   Underwent cath 01/2023 showing normal filling pressures, low cardiac output with CI 2 by Fick,1.82 thermodilution. Coronary anatomy was unchanged.  Grafts all occluded (known prior)  except LIMA-LAD.  Occluded native RCA (known from prior).  Patent mid LCx stent. Post cath he was started on digoxin  0.125 mg.   He was admitted in 1/25 with RUL PNA and treated with antibiotics.  He continued to have abdominal pain, but this time abdominal US  showed no gallstones and HIDA scan was negative.   Admitted 12/25 with A/C HFrEF. Diuresed well with IV lasix .  Admission complicated by constipation/hemorrhoids. Resolved with sorbitol , actually got hypotensive after multiple BMs requiring IVF. GDMT limited by renal function and hyperkalemia.  Today he returns for post hospital follow up. Overall feeling ok just having GI upset again. Denies palpitations, edema, or PND/Orthopnea. Intt dizziness. Had a little CP yesterday with ambulation, resolved with rest. SOB with exertion. Appetite ok, avoids salty foods. Drinks <2 L fluid a day. No fever or chills. Weight at home 220 pounds. Taking all medications. Denies ETOH, tobacco or drug use. Recently got a call from [facility], needs to see dentist first (scheduled 04/03/24) then will follow up with [facility].   ECG (personally reviewed from 02/26/24): NSR 1st degree AVB  [employer] ICD (personally reviewed): HeartLogic 14, activity 0.6 hf/day, avg HR 67  PMH: 1. Hyperlipidemia 2. CAD: MI at age 64 in 2012, PCI to LAD.   - Inferoposterior MI in 4/22: LHC showed 3 vessel disease.  CABG with LIMA-LAD, radial to OM1, sequential SVG-PDA and D1.  - NSTEMI-->LHC (10/22): patent LIMA to LAD, all other grafts occluded. S/p PCI DES native mid LCx 99% stenosis, LAD and RCA totally occluded.  - LHC (12/24):  All SVGs occluded, LIMA-LAD patent, occluded native RCA, patent mid LCX stent.  3. Post-operative GI bleeding after CABG 4. Chronic systolic CHF: Ischemic cardiomyopathy.   - Cardiac MRI (5/22): LV EF 27%, RV EF 57%, apical thrombus, extensive viability.  - Echo (8/22) EF 20-25% - Echo (10/22): EF 35-40% - cMRI (10/22): LVEF 29%, RV EF 45%, no LV thrombus,  LGE suggestive of prior MI.  - [employer] ICD  - Cardiac contractility modulator implantation in 2/24.  - Echo (12/24): EF 30-35%, global hypokinesis, mild RV dysfunction, IVC not dilated. - RHC (12/24): mean RA 6, PA 28/13, mean PCWP 13, CI 1.82 thermo/2.0 Fick 5. LV thrombus  - No thrombus on 10/22 cMRI.  6. Prior smoker: Quit 5/22.  7. H/o hyperkalemia 8. Atrial fibrillation/flutter: Paroxysmal.  - DCCV 10/22 9. ABIs (5/22): Normal.  10. Colonic polyps 11. Symptomatic cholelithiasis 12. PAD: Peripheral arterial dopplers (5/25) showed only mild bilateral plaque.  13. Barrett's esophagus  Current Outpatient Medications  Medication Sig Dispense Refill   acetaminophen  (TYLENOL ) 500 MG tablet Take 500 mg by mouth every 6 (six) hours as needed for moderate pain (pain score 4-6) or headache.     albuterol  (VENTOLIN  HFA) 108 (90 Base) MCG/ACT inhaler Inhale 2 puffs into the lungs 3 (three) times a week. for wheezing     apixaban  (ELIQUIS ) 5 MG TABS tablet Take 1 tablet (5 mg total) by mouth 2 (two) times daily. 60 tablet 6   atorvastatin  (LIPITOR ) 80 MG tablet Take 1 tablet (80 mg total) by mouth daily. 90 tablet 3   carvedilol  (COREG ) 3.125 MG tablet Take 1 tablet (3.125 mg total) by mouth 2 (two) times daily with a meal. 60 tablet 3   dapagliflozin  propanediol (FARXIGA ) 10 MG TABS tablet Take 1 tablet (10 mg total) by mouth daily before breakfast. 30 tablet 11   furosemide  (LASIX ) 20 MG tablet Take 1 tablet (20 mg total) by mouth daily. 90 tablet 3   mexiletine (MEXITIL ) 200 MG capsule Take 1 capsule (200 mg total) by mouth 2 (two) times daily. 60 capsule 6   Omega-3 Fatty Acids (OMEGA 3 500 PO) Take 500 mg by mouth daily.     polyethylene glycol (MIRALAX  / GLYCOLAX ) 17 g packet Take 17 g by mouth daily as needed for mild constipation or moderate constipation.     ranolazine  (RANEXA ) 500 MG 12 hr tablet Take 1 tablet (500 mg total) by mouth 2 (two) times daily. 60 tablet 3   senna  (SENOKOT) 8.6 MG TABS tablet Take 1 tablet (8.6 mg total) by mouth daily. 120 tablet 0   sodium zirconium cyclosilicate  (LOKELMA ) 5 g packet Mix packet as directed and take 5 g by mouth daily. 30 packet 6   spironolactone  (ALDACTONE ) 25 MG tablet Take 0.5 tablets (12.5 mg total) by mouth daily.     No current facility-administered medications for this visit.   Allergies  Allergen Reactions   Codeine Itching   Social History   Socioeconomic History   Marital status: Single    Spouse name: Not on file   Number of children: Not on file   Years of education: Not on file   Highest education level: High school graduate  Occupational History   Occupation: Not actively working    Comment: applied for disability x 2.   Tobacco Use   Smoking status: Former    Current packs/day: 0.00    Average packs/day: 1 pack/day for 22.0 years (22.0 ttl pk-yrs)  Types: Cigarettes, Cigars    Start date: 06/29/1998    Quit date: 06/28/2020    Years since quitting: 3.7   Smokeless tobacco: Never   Tobacco comments:    cigarettes stopped in 2020, [employer] sweets started in 2018-04-05/day  Vaping Use   Vaping status: Never Used  Substance and Sexual Activity   Alcohol use: Never   Drug use: Not Currently    Types: Marijuana   Sexual activity: Never  Other Topics Concern   Not on file  Social History Narrative   Single   Lives with his parents   Trying to get his GED   DeGent date all cultures   Social Drivers of Health   Tobacco Use: Medium Risk (03/10/2024)   Patient History    Smoking Tobacco Use: Former    Smokeless Tobacco Use: Never    Passive Exposure: Not on Actuary Strain: Not on file  Food Insecurity: No Food Insecurity (02/23/2024)   Epic    Worried About Programme Researcher, Broadcasting/film/video in the Last Year: Never true    Ran Out of Food in the Last Year: Never true  Transportation Needs: No Transportation Needs (02/23/2024)   Epic    Lack of Transportation (Medical): No     Lack of Transportation (Non-Medical): No  Physical Activity: Not on file  Stress: Not on file  Social Connections: Not on file  Intimate Partner Violence: Not At Risk (02/23/2024)   Epic    Fear of Current or Ex-Partner: No    Emotionally Abused: No    Physically Abused: No    Sexually Abused: No  Depression (PHQ2-9): Medium Risk (04/14/2021)   Depression (PHQ2-9)    PHQ-2 Score: 6  Alcohol Screen: Not on file  Housing: Low Risk (02/23/2024)   Epic    Unable to Pay for Housing in the Last Year: No    Number of Times Moved in the Last Year: 0    Homeless in the Last Year: No  Utilities: Not At Risk (02/23/2024)   Epic    Threatened with loss of utilities: No  Health Literacy: Not on file   Family History  Problem Relation Age of Onset   Colon cancer Father 27   Colon cancer Paternal Grandfather    Colon cancer Paternal Aunt    Hypertension Neg Hx    Diabetes Neg Hx    Coronary artery disease Neg Hx    Esophageal cancer Neg Hx    Rectal cancer Neg Hx    Stomach cancer Neg Hx    There were no vitals taken for this visit.  Wt Readings from Last 3 Encounters:  03/10/24 99.8 kg (220 lb)  03/06/24 97.2 kg (214 lb 4.6 oz)  02/11/24 99.8 kg (220 lb)   PHYSICAL EXAM: General:  well appearing.  No respiratory difficulty. Walked into clinic with cane Neck: JVD flat.  Cor: Regular rate & rhythm. No murmurs. Lungs: clear Abd: soft to touch, hypoactive bowel sounds Extremities: no edema   Neuro: alert & oriented x 3. Affect pleasant.   ASSESSMENT & PLAN:  1. PVCs: 22% on Zio in 12/25   - Now on mexiletine, will need repeat quantification with repeat Zio monitor. Will order today.  - If PVC burden not improved will need switch to amiodarone .  - Ranolazine  was added - Continue Coreg  3.125 bid.    2. CAD: Early onset CAD s/p MI with LAD PCI in 2012,followed by CABG  x 4 4/22  Admit  10/22 with NSTEMI, cath showed patent LIMA to LAD, all other grafts occluded. S/p PCI DES  native mid LCx 99% stenosis. Repeat 01/2023 LHC showed coronary anatomy unchanged; grafts all occluded (known prior) except LIMA-LAD.  Occluded native RCA (known from prior) with patent mid LCx stent.  He had constant chest pain for > 1 week with minimally elevated troponin/no trend.  No ACS suspected.   - Short episode of chest pain yesterday with activity. Resolved with rest.  - He is now off Plavix  (stopped 8/23 due to BRBPR). - He is on Eliquis  so no ASA.   - Continue atorvastatin  80 mg daily.  - Continue ranolazine , feels like this has helped a lot.  - Off Imdur  with Coreg .    3. Chronic systolic CHF: Ischemic cardiomyopathy.  [employer] ICD.  Cardiac MRI in 10/22 showed LVEF 29% and RVEF 45%.  Patient has a cardiac contractility modulator.  Echo 12/24 showed EF 30-35%, global hypokinesis, mild RV dysfunction, IVC not dilated.  GDMT has been limited by hypotension & hyperkalemia, K is now controlled with Lokelma .  Had RHC 12/24 showing normal filling pressures, low cardiac output with CI 2 by Fick, 1.82 by thermodilution. RHC in 12/25 with normal filling pressures but CI 2.02. Echo 12/25 with EF 20-25%, RV low normal function.   - Volume okay but Heartlogic score slightly elevated. Will have him take 40 mg lasix  x2 days then can  continue lasix  20 mg po daily - Digoxin  stopped d/t poor po intake and elevated level. Labs today.  - Continue Coreg  3.125 mg bid for now.  - Continue Farxiga  10 mg daily for now.  - He will stay off spironolactone  and Entresto  for now. Restarted lokelma  5 g daily. Can add back pending labs today - Nearing end stage ischemic cardiomyopathy.  Has been referred to [facility] for transplant evaluation.  He would be a LVAD candidate if needed. Received recent call from [facility] stating he needed to see Dentist first. Has f/u with Dentist 04/03/24 then will call back to follow up with [facility].    4. 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  No thrombus on echo 08/22. No thrombus on cMRI  and echo - Continue Eliquis  5 mg bid. Denies abnormal bleeding, last CBC stable.    5. Hyperkalemia: He has had a tendency towards hyperkalemia and has been on Lokelma  in order to stay on spironolactone  and Entresto .   - Back on Lokelma . K last 5.2. Continue 5g daily. Labs today.    6. Atrial flutter/fibrillation: Both arrhythmias noted in 10/22.  He tolerated poorly with worsened symptoms.  He had DCCV in 10/22. Previously on amiodarone  but later stopped.  - Continue Eliquis .  - Would favor ablation if he has recurrent AF.   7. Constipation - Ongoing issue - Last BM 2 days ago. Taking miralax  and senokot regularly - Asked him to try dulcolax OTC. Required Enema and Sorbitol  inpatient.  - Plans to schedule with GI soon   Follow up in 1 month with APP.    Harlene HERO [facility]  04/05/24 "

## 2024-04-06 ENCOUNTER — Ambulatory Visit: Admitting: Internal Medicine

## 2024-04-07 ENCOUNTER — Telehealth (HOSPITAL_COMMUNITY): Payer: Self-pay

## 2024-04-07 NOTE — Telephone Encounter (Signed)
 Called to confirm/remind patient of their appointment at the [facility] Clinic on 04/10/24.   Appointment:   [x] Confirmed  [] Left mess   [] No answer/No voice mail  [] VM Full/unable to leave message  [] Phone not in service  Patient reminded to bring all medications and/or complete list.  Confirmed patient has transportation. Gave directions, instructed to utilize valet parking.

## 2024-04-10 ENCOUNTER — Ambulatory Visit (HOSPITAL_COMMUNITY)

## 2024-04-14 ENCOUNTER — Ambulatory Visit: Admitting: Cardiovascular Disease

## 2024-04-24 ENCOUNTER — Ambulatory Visit: Admitting: Gastroenterology

## 2024-05-09 ENCOUNTER — Encounter

## 2024-05-26 ENCOUNTER — Ambulatory Visit: Admitting: Cardiovascular Disease

## 2024-06-16 ENCOUNTER — Ambulatory Visit: Admitting: Physician Assistant

## 2024-08-08 ENCOUNTER — Encounter

## 2024-11-07 ENCOUNTER — Encounter

## 2025-02-06 ENCOUNTER — Encounter
